# Patient Record
Sex: Male | Born: 1935 | Race: White | Hispanic: No | Marital: Married | Smoking: Former smoker
Health system: Southern US, Community
[De-identification: ages and names within clinical notes are randomized; demographics above are authoritative.]

## PROBLEM LIST (undated history)

## (undated) DIAGNOSIS — I809 Phlebitis and thrombophlebitis of unspecified site: Secondary | ICD-10-CM

## (undated) DIAGNOSIS — M545 Low back pain, unspecified: Secondary | ICD-10-CM

## (undated) DIAGNOSIS — M51379 Other intervertebral disc degeneration, lumbosacral region without mention of lumbar back pain or lower extremity pain: Secondary | ICD-10-CM

## (undated) DIAGNOSIS — M810 Age-related osteoporosis without current pathological fracture: Secondary | ICD-10-CM

## (undated) DIAGNOSIS — N401 Enlarged prostate with lower urinary tract symptoms: Secondary | ICD-10-CM

## (undated) DIAGNOSIS — M109 Gout, unspecified: Secondary | ICD-10-CM

## (undated) DIAGNOSIS — Z972 Presence of dental prosthetic device (complete) (partial): Secondary | ICD-10-CM

## (undated) DIAGNOSIS — E119 Type 2 diabetes mellitus without complications: Secondary | ICD-10-CM

## (undated) DIAGNOSIS — G629 Polyneuropathy, unspecified: Secondary | ICD-10-CM

## (undated) DIAGNOSIS — Z86718 Personal history of other venous thrombosis and embolism: Secondary | ICD-10-CM

## (undated) DIAGNOSIS — Z86010 Personal history of colonic polyps: Secondary | ICD-10-CM

## (undated) DIAGNOSIS — C61 Malignant neoplasm of prostate: Secondary | ICD-10-CM

## (undated) DIAGNOSIS — R911 Solitary pulmonary nodule: Secondary | ICD-10-CM

## (undated) DIAGNOSIS — F1190 Opioid use, unspecified, uncomplicated: Secondary | ICD-10-CM

## (undated) DIAGNOSIS — N2581 Secondary hyperparathyroidism of renal origin: Secondary | ICD-10-CM

## (undated) DIAGNOSIS — K5909 Other constipation: Secondary | ICD-10-CM

## (undated) DIAGNOSIS — M2550 Pain in unspecified joint: Secondary | ICD-10-CM

## (undated) DIAGNOSIS — I5022 Chronic systolic (congestive) heart failure: Secondary | ICD-10-CM

## (undated) DIAGNOSIS — H409 Unspecified glaucoma: Secondary | ICD-10-CM

## (undated) DIAGNOSIS — M4800 Spinal stenosis, site unspecified: Secondary | ICD-10-CM

## (undated) DIAGNOSIS — I2510 Atherosclerotic heart disease of native coronary artery without angina pectoris: Secondary | ICD-10-CM

## (undated) DIAGNOSIS — I10 Essential (primary) hypertension: Secondary | ICD-10-CM

## (undated) DIAGNOSIS — E669 Obesity, unspecified: Secondary | ICD-10-CM

## (undated) DIAGNOSIS — G5603 Carpal tunnel syndrome, bilateral upper limbs: Secondary | ICD-10-CM

## (undated) DIAGNOSIS — M5137 Other intervertebral disc degeneration, lumbosacral region: Secondary | ICD-10-CM

## (undated) DIAGNOSIS — D631 Anemia in chronic kidney disease: Secondary | ICD-10-CM

## (undated) DIAGNOSIS — Z85528 Personal history of other malignant neoplasm of kidney: Secondary | ICD-10-CM

## (undated) DIAGNOSIS — G8929 Other chronic pain: Secondary | ICD-10-CM

## (undated) DIAGNOSIS — E785 Hyperlipidemia, unspecified: Secondary | ICD-10-CM

## (undated) DIAGNOSIS — M353 Polymyalgia rheumatica: Secondary | ICD-10-CM

## (undated) DIAGNOSIS — I350 Nonrheumatic aortic (valve) stenosis: Secondary | ICD-10-CM

## (undated) DIAGNOSIS — M9420 Chondromalacia, unspecified site: Secondary | ICD-10-CM

## (undated) DIAGNOSIS — D369 Benign neoplasm, unspecified site: Secondary | ICD-10-CM

## (undated) DIAGNOSIS — Z860101 Personal history of adenomatous and serrated colon polyps: Secondary | ICD-10-CM

## (undated) DIAGNOSIS — N189 Chronic kidney disease, unspecified: Secondary | ICD-10-CM

## (undated) DIAGNOSIS — M1A9XX0 Chronic gout, unspecified, without tophus (tophi): Secondary | ICD-10-CM

## (undated) DIAGNOSIS — K219 Gastro-esophageal reflux disease without esophagitis: Secondary | ICD-10-CM

## (undated) HISTORY — PX: PROSTATE SURGERY: SHX751

## (undated) HISTORY — PX: EYE SURGERY: SHX253

## (undated) HISTORY — PX: CARDIAC CATHETERIZATION: SHX172

## (undated) HISTORY — PX: APPENDECTOMY: SHX54

## (undated) HISTORY — DX: Chondromalacia, unspecified site: M94.20

## (undated) HISTORY — DX: Spinal stenosis, site unspecified: M48.00

## (undated) HISTORY — DX: Phlebitis and thrombophlebitis of unspecified site: I80.9

## (undated) HISTORY — DX: Age-related osteoporosis without current pathological fracture: M81.0

## (undated) HISTORY — PX: BACK SURGERY: SHX140

## (undated) HISTORY — DX: Obesity, unspecified: E66.9

## (undated) HISTORY — DX: Benign neoplasm, unspecified site: D36.9

## (undated) HISTORY — PX: CATARACT EXTRACTION W/ INTRAOCULAR LENS IMPLANT: SHX1309

## (undated) HISTORY — DX: Pain in unspecified joint: M25.50

---

## 1970-12-21 HISTORY — PX: VEIN LIGATION AND STRIPPING: SHX2653

## 1991-12-22 DIAGNOSIS — I2510 Atherosclerotic heart disease of native coronary artery without angina pectoris: Secondary | ICD-10-CM

## 1991-12-22 HISTORY — DX: Atherosclerotic heart disease of native coronary artery without angina pectoris: I25.10

## 1991-12-22 HISTORY — PX: CORONARY ANGIOPLASTY: SHX604

## 2001-11-09 ENCOUNTER — Encounter: Admission: RE | Admit: 2001-11-09 | Discharge: 2001-11-09 | Payer: Self-pay | Admitting: Nephrology

## 2001-11-09 ENCOUNTER — Encounter: Payer: Self-pay | Admitting: Nephrology

## 2001-11-16 ENCOUNTER — Encounter: Admission: RE | Admit: 2001-11-16 | Discharge: 2001-11-16 | Payer: Self-pay | Admitting: Nephrology

## 2001-11-16 ENCOUNTER — Encounter: Payer: Self-pay | Admitting: Nephrology

## 2003-10-26 ENCOUNTER — Encounter: Admission: RE | Admit: 2003-10-26 | Discharge: 2003-10-26 | Payer: Self-pay | Admitting: Internal Medicine

## 2005-12-21 HISTORY — PX: CORONARY ARTERY BYPASS GRAFT: SHX141

## 2006-03-30 ENCOUNTER — Inpatient Hospital Stay (HOSPITAL_BASED_OUTPATIENT_CLINIC_OR_DEPARTMENT_OTHER): Admission: RE | Admit: 2006-03-30 | Discharge: 2006-03-30 | Payer: Self-pay | Admitting: Interventional Cardiology

## 2006-03-30 ENCOUNTER — Inpatient Hospital Stay (HOSPITAL_COMMUNITY): Admission: AD | Admit: 2006-03-30 | Discharge: 2006-04-06 | Payer: Self-pay | Admitting: Interventional Cardiology

## 2006-03-30 HISTORY — PX: CARDIAC CATHETERIZATION: SHX172

## 2006-03-31 HISTORY — PX: CORONARY ARTERY BYPASS GRAFT: SHX141

## 2006-12-21 DIAGNOSIS — Z86718 Personal history of other venous thrombosis and embolism: Secondary | ICD-10-CM

## 2006-12-21 HISTORY — DX: Personal history of other venous thrombosis and embolism: Z86.718

## 2007-02-10 ENCOUNTER — Ambulatory Visit (HOSPITAL_COMMUNITY): Admission: RE | Admit: 2007-02-10 | Discharge: 2007-02-10 | Payer: Self-pay | Admitting: Rheumatology

## 2007-02-10 ENCOUNTER — Ambulatory Visit: Payer: Self-pay | Admitting: Vascular Surgery

## 2007-03-17 ENCOUNTER — Encounter: Payer: Self-pay | Admitting: Vascular Surgery

## 2007-03-17 ENCOUNTER — Ambulatory Visit: Payer: Self-pay | Admitting: Vascular Surgery

## 2007-03-17 ENCOUNTER — Inpatient Hospital Stay (HOSPITAL_COMMUNITY): Admission: EM | Admit: 2007-03-17 | Discharge: 2007-03-18 | Payer: Self-pay | Admitting: Emergency Medicine

## 2007-03-17 ENCOUNTER — Ambulatory Visit (HOSPITAL_COMMUNITY): Admission: RE | Admit: 2007-03-17 | Discharge: 2007-03-17 | Payer: Self-pay | Admitting: Rheumatology

## 2007-12-22 DIAGNOSIS — Z8546 Personal history of malignant neoplasm of prostate: Secondary | ICD-10-CM

## 2007-12-22 HISTORY — DX: Personal history of malignant neoplasm of prostate: Z85.46

## 2008-09-26 ENCOUNTER — Encounter: Admission: RE | Admit: 2008-09-26 | Discharge: 2008-09-26 | Payer: Self-pay | Admitting: Rheumatology

## 2008-10-15 ENCOUNTER — Ambulatory Visit: Payer: Self-pay | Admitting: Hematology

## 2008-11-01 ENCOUNTER — Ambulatory Visit: Admission: RE | Admit: 2008-11-01 | Discharge: 2009-01-30 | Payer: Self-pay | Admitting: Radiation Oncology

## 2008-11-21 LAB — CBC WITH DIFFERENTIAL/PLATELET
Basophils Absolute: 0 10*3/uL (ref 0.0–0.1)
Eosinophils Absolute: 0 10*3/uL (ref 0.0–0.5)
HGB: 13.7 g/dL (ref 13.0–17.1)
LYMPH%: 18.4 % (ref 14.0–48.0)
MCHC: 34 g/dL (ref 32.0–35.9)
MCV: 97.4 fL (ref 81.6–98.0)
NEUT%: 74.5 % (ref 40.0–75.0)
RBC: 4.15 10*6/uL — ABNORMAL LOW (ref 4.20–5.71)

## 2008-11-27 LAB — COMPREHENSIVE METABOLIC PANEL
ALT: 36 U/L (ref 0–53)
AST: 29 U/L (ref 0–37)
Alkaline Phosphatase: 61 U/L (ref 39–117)
BUN: 35 mg/dL — ABNORMAL HIGH (ref 6–23)
CO2: 24 mEq/L (ref 19–32)
Calcium: 9.7 mg/dL (ref 8.4–10.5)
Chloride: 105 mEq/L (ref 96–112)
Creatinine, Ser: 1.84 mg/dL — ABNORMAL HIGH (ref 0.40–1.50)
Glucose, Bld: 226 mg/dL — ABNORMAL HIGH (ref 70–99)
Sodium: 139 mEq/L (ref 135–145)

## 2008-11-27 LAB — HYPERCOAGULABLE PANEL, COMPREHENSIVE RET.
AntiThromb III Func: 119 % (ref 76–126)
Beta-2-Glycoprotein I IgM: <4 U/mL (ref <10)
DRVVT: 39.6 secs (ref 36.1–47.0)
PTTLA 4:1 Mix: 48.8 secs (ref 36.3–48.8)
Protein C Activity: 136 % — ABNORMAL HIGH (ref 75–133)
Protein C, Total: 103 % (ref 70–140)
Protein S Ag, Total: 119 % (ref 70–140)

## 2008-12-17 ENCOUNTER — Ambulatory Visit: Payer: Self-pay | Admitting: Hematology

## 2009-02-08 ENCOUNTER — Ambulatory Visit (HOSPITAL_BASED_OUTPATIENT_CLINIC_OR_DEPARTMENT_OTHER): Admission: RE | Admit: 2009-02-08 | Discharge: 2009-02-08 | Payer: Self-pay | Admitting: Urology

## 2009-02-08 HISTORY — PX: INSERTION PROSTATE RADIATION SEED: SUR718

## 2009-02-28 ENCOUNTER — Ambulatory Visit: Admission: RE | Admit: 2009-02-28 | Discharge: 2009-05-14 | Payer: Self-pay | Admitting: Radiation Oncology

## 2009-12-21 DIAGNOSIS — Z8739 Personal history of other diseases of the musculoskeletal system and connective tissue: Secondary | ICD-10-CM

## 2009-12-21 HISTORY — DX: Personal history of other diseases of the musculoskeletal system and connective tissue: Z87.39

## 2011-04-07 LAB — CBC
Hemoglobin: 14.2 g/dL (ref 13.0–17.0)
MCHC: 33.9 g/dL (ref 30.0–36.0)
Platelets: 228 10*3/uL (ref 150–400)
RBC: 4.24 MIL/uL (ref 4.22–5.81)

## 2011-04-07 LAB — PROTIME-INR
INR: 0.9 (ref 0.00–1.49)
Prothrombin Time: 12.7 seconds (ref 11.6–15.2)

## 2011-04-07 LAB — GLUCOSE, CAPILLARY
Glucose-Capillary: 164 mg/dL — ABNORMAL HIGH (ref 70–99)
Glucose-Capillary: 171 mg/dL — ABNORMAL HIGH (ref 70–99)

## 2011-04-07 LAB — COMPREHENSIVE METABOLIC PANEL
AST: 27 U/L (ref 0–37)
CO2: 29 mEq/L (ref 19–32)
GFR calc Af Amer: 58 mL/min — ABNORMAL LOW (ref >60)
GFR calc non Af Amer: 48 mL/min — ABNORMAL LOW (ref >60)
Sodium: 144 mEq/L (ref 135–145)
Total Bilirubin: 0.8 mg/dL (ref 0.3–1.2)

## 2011-05-05 NOTE — Op Note (Signed)
NAME:  [name], [name]                  ACCOUNT NO.:  [id number]   MEDICAL RECORD NO.:  [id number]          PATIENT TYPE:  AMB   LOCATION:  [facility]                         FACILITY:  [facility]   PHYSICIAN:  Lucina Mellow. Terance Hart, M.D.DATE OF BIRTH:  [birth year]   DATE OF PROCEDURE:  02/08/2009  DATE OF DISCHARGE:                               OPERATIVE REPORT   PREOPERATIVE DIAGNOSIS:  Prostatic carcinoma.   POSTOPERATIVE DIAGNOSIS:  Prostatic carcinoma.   PROCEDURES:  1. Radioactive seed implantation of the prostate.  2. Cystoscopy.   SURGEON:  Lucina Mellow. Terance Hart, M.D.   ASSISTANT:  Sheral Apley. Tammi Klippel, M.D.   ANESTHESIA:  General.   INDICATIONS:  This 77 year old gentleman was found to have a PSA  elevated to 5.54 and prostate ultrasound and biopsy revealed a 31 gram  prostate and the final pathology showed significant Gleason 7 carcinoma.  He was counseled about therapy and opted for radiation.  He has  undergone 5 weeks of external beam radiation and now presents for  combination therapy by means of radioactive seed implantation.  He was  advised about the risks of voiding dysfunction, bleeding, fever,  infection.   PROCEDURE:  The patient was brought to the operating room, placed in the  lithotomy position.  The external genitalia and lower abdomen and  perineum prepped and draped in the usual fashion.  Foley catheter was  inserted and a transrectal ultrasound probe inserted and radioactive  seed planning session was completed.  After that, a total of 23  activated needles with 75 seeds of I-125 were implanted under  fluoroscopic control.  Fluoroscopic x-ray was  taken which revealed good seed distribution.  He then underwent  cystoscopy and the anterior and prostatic urethras and bladder were  unremarkable, no evidence of seeds in the urethra or bladder.  Foley  catheter was inserted and the patient taken to the recovery room in good  condition having tolerated the procedure well with  minimal blood loss.      Lucina Mellow. Terance Hart, M.D.  Electronically Signed     LJP/MEDQ  D:  02/08/2009  T:  02/08/2009  Job:  [id number]   cc:   Ander Slade. [name], M.D.  Fax: [place]. Tammi Klippel, M.D.  Fax: [phone]

## 2011-05-08 NOTE — H&P (Signed)
NAME:  [name], [name]                  ACCOUNT NO.:  [id number]   MEDICAL RECORD NO.:  [id number]          PATIENT TYPE:  INP   LOCATION:  [id number]                         FACILITY:  [facility]   PHYSICIAN:  [name], M.D.DATE OF BIRTH:  1934-09-16   DATE OF ADMISSION:  03/17/2007  DATE OF DISCHARGE:                              HISTORY & PHYSICAL   PRIMARY CARE PHYSICIAN:  Dr. Rockwell Lyons.   CHIEF COMPLAINT:  Right leg swelling, and per PCP, right leg thrombus.   HISTORY OF PRESENT ILLNESS:  The patient is a 77 year old white male  with past medical history significant for right leg phlebitis, also  history of vein stripping in the right leg in 1972, recently treated for  superficial below knee clot in the right leg, per primary care physician  with NSAIDs, PMR with a normal sed rate, gout, azotemia, diabetes  mellitus, coronary artery disease - status post CABG in 2007, who  presents with above complaints.  He states that on February 23rd, he  noticed swelling in his right leg just below his knee, he was seen by  his primary care physician, diagnosed with a superficial vein thrombosis  and treated with Naprosyn.  Initially, it seemed to be improving, but  about two weeks later the swelling began to increase, with some redness  and also soreness in that leg, and he could palpate the course of the  vein to just below his right groin area.  He came back to the office to  see his primary care physician, at which time he was sent to have an  ultrasound done, and per Dr. Rockwell Lyons, the ultrasound showed a thrombus  in the greater saphenous vein, but it was beginning to progress into the  deep veins.  The patient denies chest pain, shortness of breath, fevers,  dysuria, melena and no diarrhea.  He states that his last really long  distance travel by car was in January (about two months ago to [place],  and about one to two  weeks ago he also drove to [facility]  He  denies any recent surgery  - his CABG was in April of 2007.  He states  that his father had varicose veins but no history of clots.   PAST MEDICAL HISTORY:  As stated above.  History of osteoporosis,  history of post-op atrial fibrillation, history of post-op anemia,  history of appendectomy, history of PTCA.   MEDICATIONS:  1. Indocin 75 mg p.o. b.i.d.  (Naprosyn discontinued).  2. Prednisone 2.5 mg daily.  3. Aspirin 81 mg daily.  4. Glucotrol 10 mg b.i.d.  5. Zetia 10 mg daily.  6. Xalatan 1 drop in both eyes q.h.s.  7. Colchicine 0.6 mg daily.   ALLERGIES:  PENICILLIN.   SOCIAL HISTORY:  He quit tobacco in 1981, he denies alcohol.   FAMILY HISTORY:  Father had an MI at age 65, deceased age 36.  Mother  had CABG at age 88, deceased age 53.   REVIEW OF SYSTEMS:  As per HPI.  Other review of systems negative.   PHYSICAL EXAMINATION:  GENERAL:  The patient is a pleasant, elderly  white male.  He is alert and oriented, in no apparent distress.  VITAL SIGNS:  His temperature is 97.5 with a blood pressure of 151/86,  pulse of 70, respiratory rate of 18, O2 SAT of 98%.  HEENT:  PERRL, EOMI.  Sclerae anicteric, moist mucous membranes, no oral  exudates.  NECK:  Supple, no adenopathy, no thyromegaly and no JVD.  LUNGS:  Clear to auscultation bilaterally.  No crackles or wheezes.  CARDIOVASCULAR/CHEST:  There is a well-healed midline scar, regular rate  and rhythm, normal S1, S2.  ABDOMEN:  Obese, soft, bowel sounds present, nontender, nondistended.  No organomegaly and no masses palpable.  EXTREMITIES:  On his right lower extremity just below his knee medially,  there is about a 3 to 4-cm palpable mass, firm, nontender, and there is  a palpable cord medially all the way to just below his groin area on the  right, mild erythema.  On the left, there is no edema and no erythema.  NEUROLOGIC:  Alert and oriented x3.  Cranial nerves 2-12 grossly intact.  Nonfocal exam.   LABORATORY DATA:  Chest x-ray:  No  acute findings.  EKG:  Normal sinus  rhythm.  No ischemic changes.  His white cell count is 6.7 with a  hemoglobin of 12.8, hematocrit 37.8, platelet count of 323, neutrophil  count 63%.  Chemistries:  Pending at the time of this dictation.  The  ultrasound as per HPI.   ASSESSMENT/PLAN:  Problem:  1. Propagating thrombus, right greater saphenous vein:  As discussed      above.  Possible risk factors including long distance travel in      January about a week to two weeks ago.  Also venous incompetence,      as discussed above, will also obtain a hypercoagulable panel to      rule out other etiologies.  Will place patient on Lovenox/Coumadin.      Consult care manager for assistance with possible home Lovenox.  2. Diabetes mellitus - Continue Accu-Check monitoring, sliding scale      insulin.  3. Coronary artery disease status post coronary artery bypass graft,      stable.  Continue aspirin.  4. Dyslipidemia.  Continue Zetia and Lipitor.  5. History of PMR with normal sed rate - continue prednisone.  6. History of gout - continue Colchicine.      [name], M.D.  Electronically Signed     ACV/MEDQ  D:  03/17/2007  T:  03/17/2007  Job:  [id number]   cc:   Dr. Loel Lyons at [place]. Polite, M.D.

## 2011-05-08 NOTE — Consult Note (Signed)
[name], [name]                  ACCOUNT NO.:  [id number]   MEDICAL RECORD NO.:  [id number]          PATIENT TYPE:  OIB   LOCATION:  1963                         FACILITY:  [facility]   PHYSICIAN:  Lanelle Bal, MD    DATE OF BIRTH:  09/25/1934   DATE OF CONSULTATION:  03/30/2006  DATE OF DISCHARGE:                                   CONSULTATION   FOLLOW-UP CARE PHYSICIAN:  Belva Crome, M.D.   PRIMARY CARE PHYSICIAN:  Ander Slade. [name], M.D.   REASON FOR CONSULTATION:  Coronary artery disease with left main  obstruction.   HISTORY OF PRESENT ILLNESS:  The patient is a 77 year old male with known  coronary artery disease, who has known diabetes and known coronary disease,  was found in March to have inferior apical ischemia and decreased ejection  fraction and a history of total occlusion of the right coronary artery.  He  had previously had an angioplasty of the right coronary artery in 1993.  Over the past several months, especially with cold weather, he has had  recurrence of substernal chest discomfort, which radiates to the left  shoulder, with exertion.  He denies resting shortness of breath, denies  nausea.  In March of 2007, a repeat Cardiolite stress test was markedly  abnormal and the patient was brought to the outpatient cath lab for cath to  rule out progression of disease, particularly in the LAD, which on previous  caths, had had some disease.   PAST MEDICAL HISTORY:  1.  Coronary artery disease with angioplasty of the right coronary artery in      1993.  Repeat cath in 1994, with a 40-50% mid-LAD lesion.  2.  History of diabetes.  3.  History of polymyalgia rheumatica with a normal sed rate, diagnosed      approximately one year ago.  The patient has been on tapering doses of      steroids, from 15 mg a day to, since April 1, 5 mg a day.  4.  History of osteoporosis.  5.  History of dyslipidemia.  6.  History of gout.  7.  History of phlebitis in the right  lower leg.   CARDIAC RISK FACTORS:  The patient denies hypertension.  He does have type 2  diabetes with hemoglobin A1C most recent of 7.1.  Was a remote smoker, quit  25 years ago.  He has a history of hyperlipidemia, currently on Zetia and  Lipitor.  Denies previous stroke.  Denies claudication.  Baseline creatinine  is 1.5.   PAST SURGICAL HISTORY:  1.  Appendectomy at age 82.  2.  Right lower leg vein stripping in the 1970's.   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  Denies  alcohol use.   MEDICATIONS INCLUDE:  1.  Cardizem-CD 240 mg daily.  2.  Aspirin 81 mg daily.  3.  Glucotrol 10 mg each a.m. and each p.m.  4.  Actos 15 mg a day.  5.  Colchicine  0.6 mg a day.  6.  Prednisone 5 mg a day.  7.  Actonel 35 mg a week.  8.  Lipitor 20 mg a day.  9.  Indomethacin p.r.n.  10. Zetia 10 mg a day.  11. Xalatan 0.05% one drop in each eye.  12. Calcium 600 mg a day.  13. Multivitamin once a day.   DRUG ALLERGIES INCLUDE:  PENICILLIN, 45 years ago, caused itching and  swelling of his feet and hands.   REVIEW OF SYSTEMS:  CARDIAC:  Positive for chest discomfort.  Denies resting  shortness of breath.  He does have exertional shortness of breath when  climbing stairs, but can walk on the flat up to 30 minutes without shortness  of breath.  Denies orthopnea, pre-syncope, syncope or palpitations.  He does  have right lower extremity swelling at the end of the day.  GENERAL:  Denies  weight-gain.  Notes that, since taking steroids, his overall symptoms and  activity level have improved.  RESPIRATORY:  Noted above; denies hemoptysis.  GASTROINTESTINAL:  No change in bowel habits, no blood in stool.  NEUROLOGIC:  Denies amaurosis or TIA.  MUSCULOSKELETAL:  As noted above with  significant arthritis, especially in the knees.  GU:  Denies hematuria.  Denied recent infections.  ENDOCRINE:  Known  diabetes.  Denies psychiatric  history.  Denies claudication.   PHYSICAL EXAMINATION:  VITAL SIGNS:  Heart rate is 80, blood pressure is  120/82, O2 sat is 96% on room air.  He is 5 feet 8 inches tall, weighs 200  pounds.  GENERAL:  The patient is awake, alert, neurologically intact and able to  relate his history.  HEENT:  Pupils were equal, round and reactive to light.  NECK:  Without carotid bruits.  LUNGS:  Clear bilaterally.  CARDIAC EXAM:  Reveals regular rate and rhythm without murmur or gallop.  ABDOMINAL EXAM:  Benign without palpable mass.  NEUROLOGIC EXAM:  Intact without gross neurologic abnormalities.  EXTREMITIES:  He has 2+ DP and PT pulses bilaterally.   LABORATORY FINDINGS:  Creatinine is 1.5, glucose is 236.  Hematocrit is  39.4, white count 9.1.   Cardiac catheterization shows ejection fraction approximately 50% without  mitral regurgitation.  Angiography shows 70-80% distal left main, 80%  proximal LAD, 50% proximal circumflex, 80% ramus.  The right coronary artery  is totally occluded with collateral filling of a moderate sized PL branch.   IMPRESSION:  Patient with positive stress test, diabetes, left main and  three-vessel disease.  Agree with recommendation for coronary artery bypass  grafting.  The risks and options have been discussed with the patient in  detail and the risks of surgery, including death, infection, stroke,  myocardial infarction, bleeding, blood transfusion, have all been discussed  with the patient.  We specifically discussed renal failure, especially with  his known diabetes.  The risk of angioplasty or continued medical therapy far outweighs the risks  of bypass surgery and the patient and his wife have had their questions  answered and he is willing to proceed.  We will plan on proceeding with  bypass surgery on March 31, 2006.  The patient is agreeable with this  approach.     Lanelle Bal, MD  Electronically Signed      EG/MEDQ  D:  03/30/2006  T:  03/30/2006  Job:  [id number]

## 2011-05-08 NOTE — Cardiovascular Report (Signed)
NAMERyden, Weigandt Carle                  ACCOUNT NO.:  [id number]   MEDICAL RECORD NO.:  [id number]          PATIENT TYPE:  OIB   LOCATION:  1963                         FACILITY:  [facility]   PHYSICIAN:  Belva Crome, M.D.   DATE OF BIRTH:  Mar 23, 1934   DATE OF PROCEDURE:  03/30/2006  DATE OF DISCHARGE:                              CARDIAC CATHETERIZATION   INDICATIONS FOR PROCEDURE:  Progressive angina over the past 6-8 months.  Recent Cardiolite study demonstrating a large fixed inferior wall defect  that is chronic and a suggestion of diminished apical uptake which was new.  The study is being done to rule out progression of LAD disease.   PROCEDURE PERFORMED:  1.  Left heart catheterization.  2.  Selective coronary angiography.  3.  Left ventriculography.   DESCRIPTION:  After informed consent and using 1% Xylocaine with local  infiltration in the right iliac region, a 4-French sheath was placed in the  right femoral artery using the modified Seldinger technique.  The 4-French,  A2 multipurpose catheter was then used for hemodynamic recordings, left  ventriculography by hand injection and right coronary angiography.  We used  #4 left catheter for left coronary angiography.  The patient tolerated  procedure without complications.   RESULTS:  1.  Hemodynamic data.      1.  Aortic pressure is 121/69.      2.  Left ventricular pressure 125/80 mmHg.  2.  Coronary angiography:  The left and right coronaries are heavily      calcified.      1.  Left main coronary:  There is distal 70% stenosis of the left main.      2.  Left anterior descending coronary:  The LAD was a large vessel that          wraps around the left ventricular apex.  It supplies collaterals to          the distal right coronary as does the left atrial recurrent branch          of the circumflex artery.  The LAD in its proximal/mid segment          contains segmental 70% narrowing.      3.  Ramus intermedius branch:   Large ramus branch bifurcates into two          lateral wall branches.  Proximal to and within the bifurcation there          is 80% stenosis of both limbs of the bifurcation and proximal to the          bifurcation..      4.  Circumflex artery:  Circumflex coronary artery contains perhaps 30-          40% narrowing at its origin from the left main.  It gives a left          atrial recurrent branch that supplies the distal right coronary.          PDA and left ventricular branch was seen to fill.      5.  Right coronary:  The right coronary is known to be chronically and          totally occluded.  There is mid 1% obstruction.  A large acute          marginal branch supplies the distal right coronary via collaterals          that also supply the PDA.   CONCLUSIONS:  1.  Severe coronary artery disease with chronic total occlusion of right      coronary collateralized from the left atrial recurrent in LAD as well as      an acute marginal branch of the right coronary.  2.  There is 70% stenosis in the distal left main.  There is 80% stenosis in      the proximal LAD and there is 80% stenosis in the ramus intermedius      branch.  Small circumflex is free of significant stenosis.  3.  Overall normal LV function.   PLAN:  The patient needs to have coronary bypass grafting.  He has had a  gradual development of his symptoms over the last 6 months to a year.  He is  having no acute coronary syndrome type symptoms at this time.  We will go  ahead and get him set up to see Dr. Servando Snare as an outpatient as soon as  possible on April 01, 2006.  Hopefully, surgery can be done within the next  7 days or so.      Belva Crome, M.D.  Electronically Signed     HWS/MEDQ  D:  03/30/2006  T:  03/30/2006  Job:  [id number]   cc:   Ander Slade. [name], M.D.  Fax: [place], [place]  [phone]  [place]  [place] [address]

## 2011-05-08 NOTE — Discharge Summary (Signed)
NAMETramar, [name]                  ACCOUNT NO.:  [id number]   MEDICAL RECORD NO.:  [id number]          PATIENT TYPE:  INP   LOCATION:  2011                         FACILITY:  [facility]   PHYSICIAN:  Lanelle Bal, MD    DATE OF BIRTH:  1934-05-05   DATE OF ADMISSION:  03/30/2006  DATE OF DISCHARGE:  04/06/2006                                 DISCHARGE SUMMARY   PRIMARY DIAGNOSIS:  Coronary occlusive disease.   SECONDARY DIAGNOSES:  1.  Postoperative atrial fibrillation.  2.  Postoperative anemia.   SECONDARY DIAGNOSES:  1.  Diabetes mellitus.  2.  History of polymyalgia rheumatica, with normal sed rate, diagnosed      approximately one year ago.  3.  History of osteoporosis.  4.  History of dyslipidemia.  5.  History of gout.  6.  History of phlebitis in the right lower leg.  7.  Status post appendectomy.  8.  Status post right lower leg vein stripping in 1970s.   ALLERGIES:  Allergic to PENICILLIN.   IN-HOSPITAL OPERATIONS AND PROCEDURES:  1.  Cardiac catheterization with left heart catheterization, selective      coronary angiography, left ventriculography.  2.  Coronary artery bypass grafting x4, using the left internal mammary      artery to left anterior descending coronary artery, reverse saphenous      vein graft to intermediate coronary artery, reverse saphenous vein graft      sequentially to the distal RCA and posterolateral branch of the RCA,      with left leg Endovein harvesting.   HISTORY AND PHYSICAL AND HOSPITAL COURSE:  The patient is a 77 year old male  with known coronary artery disease who has known diabetes.  The patient was  found in March 2007 to have inferior apical ischemia and decreased ejection  fraction and history of total occlusion of the RCA.  He had previously had  an angioplasty of the RCA in 1993.  Over the past several months, especially  in the cold weather, he has had recurrent substernal chest discomfort which  radiates to the left  shoulder with exertion.  In March 2007, a repeat  Cardiolite stress test was markedly abnormal. The patient was brought to the  outpatient catheterization lab for catheterization to rule out progression  of disease.  This was done on March 30, 2006.  This showed severe coronary  artery disease with chronic total occlusion of the right coronary  collateral, from the left atrial recurrent and LAD as well as an acute  marginal branch of the RCA.  There was 70% stenosis in the distal left main.  There was 80% stenosis in the proximal LAD, and there was 80% stenosis in  the ramus intermedius branch.  Overall normal LV function.  Following  catheterization, Dr. Servando Snare was consulted.  The patient was seen and  evaluated by Dr. Servando Snare.  Dr. Servando Snare discussed risks and benefits of  this procedure with the patient.  The patient acknowledged understanding and  agreed proceed.  Surgery was scheduled for March 31, 2006.   The patient was taken  to operating room on March 31, 2006, where he  underwent coronary artery bypass grafting x4, using a left internal mammary  artery to left anterior descending coronary artery, reverse saphenous vein  graft to the intermediate coronary artery, reverse saphenous vein graft  sequentially to the distal RCA and posterolateral branch of the RCA, with  left leg Endovein harvest.  The patient tolerated this procedure well and  was transferred up to the intensive care unit in stable condition.  Immediately following surgery, the patient was seen to be hemodynamically  stable.  The patient was extubated in the early morning following surgery.  During the patient's postoperative course, he did have a brief episode of  postoperative atrial fibrillation.  The patient converted back to normal  sinus rhythm on his own and remained in normal sinus rhythm the remainder of  his postoperative course.  He had maintained in normal sinus rhythm, treated  with beta blocker.  Also,  during the patient's postoperative course, he  developed acute blood loss anemia.  Hemoglobin and hematocrit had dropped to  7.7 and 22 on postop day 3.  The patient received 1 unit of packed red blood  cells.  The patient's hemoglobin and hematocrit were stable prior to  discharge.  Last H&H was done postop day 6, measuring 9.3 and 26.6.  The  remainder of the patient's postoperative course was pretty much  unremarkable.  Chest tubes and lines were discontinued in the normal  fashion.  The patient was out of bed ambulating well.  He was transferred  out to 2000 on postop day 5.  The patient's incisions were dry and intact  and healing well.  The patient remained afebrile postoperatively.  Vital  signs were monitored, and blood pressure was stable prior to discharge.  The  patient was weaned off oxygen, saturating greater than 90% prior to  discharge.  The patient's creatinine was also monitored postoperatively.  This remained stable.  Last creatinine obtained was postop day 6, and it  measured 1.4.  The patient was tolerating a regular diet well.  No nausea or  vomiting noted.  Bowel movements within normal limits.  Prior to discharge,  the patient was below his preoperative weight, weighing at 205.7 pounds,  where his preoperative weight was 210 pounds.  The patient does have a  history of diabetes.  The patient's blood sugars were monitored during his  postoperative course.  He was restarted on his home diabetic medications.  Blood sugars were well controlled prior to discharge.   The patient was discharged to home postop day 6, April 06, 2006, in stable  condition.  Follow-up appointment will be arranged with Dr. Servando Snare for in  3 weeks.  The patient will need to follow up with Dr. Tamala Julian in 2 weeks.  [name] received instructions on diet, activity level, and incisional care.  He was told no driving until released to do so, no heavy lifting over 10 pounds.  The patient is told he is  allowed to shower, washing his incisions  using soap and water.  He is to contact the office if he develops any  drainage or opening from any of his incision sites.  The patient was told to  ambulate 3-4 times a day, progress as tolerated, and to continue his  breathing exercises.  The patient was educated on diet to be lowfat, low  salt.  He acknowledges understanding.   DISCHARGE MEDICATIONS:  1.  Xalatan 1 drop both eyes  at night.  2.  Multivitamin daily.  3.  Calcium 600 mg daily.  4.  Aspirin 325 mg daily.  5.  Toprol XL 25 mg daily.  6.  Zetia 10 mg daily.  7.  Prednisone 5 mg daily.  8.  Actos 30 mg daily.  9.  Glipizide 10 mg b.i.d.Marland Kitchen  10. Lipitor 20 mg at bedtime.  11. Lasix 40 mg daily x5 days.  12. Potassium chloride 20 mEq daily x5 days.  13. Colchicine 0.6 mg daily.  14. Oxycodone 5 mg 1-2 tablets q.4-6 h. p.r.n. pain.      Darlin Coco, [place]      Lanelle Bal, MD  Electronically Signed    KMD/MEDQ  D:  05/06/2006  T:  05/07/2006  Job:  [id number]   cc:   Belva Crome, M.D.  Fax: [phone]

## 2012-10-25 ENCOUNTER — Other Ambulatory Visit: Payer: Self-pay | Admitting: Geriatric Medicine

## 2012-10-25 DIAGNOSIS — M549 Dorsalgia, unspecified: Secondary | ICD-10-CM

## 2012-10-28 ENCOUNTER — Other Ambulatory Visit: Payer: Self-pay | Admitting: Geriatric Medicine

## 2012-10-28 DIAGNOSIS — Z139 Encounter for screening, unspecified: Secondary | ICD-10-CM

## 2012-10-31 ENCOUNTER — Ambulatory Visit
Admission: RE | Admit: 2012-10-31 | Discharge: 2012-10-31 | Disposition: A | Discharge status: Home / Self Care | Payer: Medicare Other | Source: Ambulatory Visit | Attending: Geriatric Medicine | Admitting: Geriatric Medicine

## 2012-10-31 DIAGNOSIS — M549 Dorsalgia, unspecified: Secondary | ICD-10-CM

## 2012-10-31 DIAGNOSIS — Z139 Encounter for screening, unspecified: Secondary | ICD-10-CM

## 2012-11-01 ENCOUNTER — Other Ambulatory Visit: Payer: Self-pay | Admitting: Geriatric Medicine

## 2012-11-01 DIAGNOSIS — N2889 Other specified disorders of kidney and ureter: Secondary | ICD-10-CM

## 2012-11-04 ENCOUNTER — Ambulatory Visit
Admission: RE | Admit: 2012-11-04 | Discharge: 2012-11-04 | Disposition: A | Discharge status: Home / Self Care | Payer: Medicare Other | Source: Ambulatory Visit | Attending: Geriatric Medicine | Admitting: Geriatric Medicine

## 2012-11-04 DIAGNOSIS — N2889 Other specified disorders of kidney and ureter: Secondary | ICD-10-CM

## 2012-11-24 ENCOUNTER — Other Ambulatory Visit: Payer: Self-pay | Admitting: Neurosurgery

## 2012-11-24 DIAGNOSIS — M4802 Spinal stenosis, cervical region: Secondary | ICD-10-CM

## 2012-11-26 ENCOUNTER — Other Ambulatory Visit: Payer: Medicare Other

## 2012-11-30 ENCOUNTER — Other Ambulatory Visit: Payer: Self-pay | Admitting: Urology

## 2012-11-30 ENCOUNTER — Ambulatory Visit
Admission: RE | Admit: 2012-11-30 | Discharge: 2012-11-30 | Disposition: A | Discharge status: Home / Self Care | Payer: Medicare Other | Source: Ambulatory Visit | Attending: Neurosurgery | Admitting: Neurosurgery

## 2012-11-30 DIAGNOSIS — M4802 Spinal stenosis, cervical region: Secondary | ICD-10-CM

## 2012-11-30 DIAGNOSIS — Q619 Cystic kidney disease, unspecified: Secondary | ICD-10-CM

## 2012-12-06 ENCOUNTER — Other Ambulatory Visit: Payer: Medicare Other

## 2012-12-06 ENCOUNTER — Ambulatory Visit
Admission: RE | Admit: 2012-12-06 | Discharge: 2012-12-06 | Disposition: A | Discharge status: Home / Self Care | Payer: Medicare Other | Source: Ambulatory Visit | Attending: Urology | Admitting: Urology

## 2012-12-06 DIAGNOSIS — Q619 Cystic kidney disease, unspecified: Secondary | ICD-10-CM

## 2012-12-06 MED ORDER — GADOBENATE DIMEGLUMINE 529 MG/ML IV SOLN
9.0000 mL | Freq: Once | INTRAVENOUS | Status: AC | PRN
  Administered 2012-12-06: 9 mL via INTRAVENOUS

## 2012-12-21 DIAGNOSIS — Z85528 Personal history of other malignant neoplasm of kidney: Secondary | ICD-10-CM

## 2012-12-21 HISTORY — DX: Personal history of other malignant neoplasm of kidney: Z85.528

## 2012-12-27 ENCOUNTER — Other Ambulatory Visit: Payer: Self-pay | Admitting: Geriatric Medicine

## 2012-12-27 DIAGNOSIS — M79609 Pain in unspecified limb: Secondary | ICD-10-CM

## 2012-12-27 DIAGNOSIS — R609 Edema, unspecified: Secondary | ICD-10-CM

## 2012-12-28 ENCOUNTER — Ambulatory Visit
Admission: RE | Admit: 2012-12-28 | Discharge: 2012-12-28 | Disposition: A | Discharge status: Home / Self Care | Payer: Medicare Other | Source: Ambulatory Visit | Attending: Geriatric Medicine | Admitting: Geriatric Medicine

## 2012-12-28 DIAGNOSIS — R609 Edema, unspecified: Secondary | ICD-10-CM

## 2012-12-28 DIAGNOSIS — M79609 Pain in unspecified limb: Secondary | ICD-10-CM

## 2012-12-30 ENCOUNTER — Ambulatory Visit (HOSPITAL_COMMUNITY)
Admission: RE | Admit: 2012-12-30 | Discharge: 2012-12-30 | Disposition: A | Discharge status: Home / Self Care | Payer: Medicare Other | Source: Ambulatory Visit | Attending: Urology | Admitting: Urology

## 2012-12-30 ENCOUNTER — Other Ambulatory Visit (HOSPITAL_COMMUNITY): Payer: Self-pay | Admitting: Urology

## 2012-12-30 DIAGNOSIS — D4959 Neoplasm of unspecified behavior of other genitourinary organ: Secondary | ICD-10-CM

## 2012-12-30 DIAGNOSIS — I10 Essential (primary) hypertension: Secondary | ICD-10-CM | POA: Insufficient documentation

## 2012-12-30 DIAGNOSIS — N289 Disorder of kidney and ureter, unspecified: Secondary | ICD-10-CM | POA: Insufficient documentation

## 2013-01-02 ENCOUNTER — Encounter (HOSPITAL_COMMUNITY): Payer: Self-pay | Admitting: Pharmacy Technician

## 2013-01-02 ENCOUNTER — Other Ambulatory Visit (HOSPITAL_COMMUNITY): Payer: Self-pay | Admitting: Urology

## 2013-01-02 ENCOUNTER — Other Ambulatory Visit: Payer: Self-pay | Admitting: Urology

## 2013-01-02 ENCOUNTER — Other Ambulatory Visit: Payer: Self-pay | Admitting: Physician Assistant

## 2013-01-02 DIAGNOSIS — N2889 Other specified disorders of kidney and ureter: Secondary | ICD-10-CM

## 2013-01-02 DIAGNOSIS — D4959 Neoplasm of unspecified behavior of other genitourinary organ: Secondary | ICD-10-CM

## 2013-01-03 ENCOUNTER — Encounter (HOSPITAL_COMMUNITY): Payer: Self-pay

## 2013-01-03 ENCOUNTER — Ambulatory Visit (HOSPITAL_COMMUNITY)
Admission: RE | Admit: 2013-01-03 | Discharge: 2013-01-03 | Disposition: A | Discharge status: Home / Self Care | Payer: Medicare Other | Source: Ambulatory Visit | Attending: Urology | Admitting: Urology

## 2013-01-03 DIAGNOSIS — D4959 Neoplasm of unspecified behavior of other genitourinary organ: Secondary | ICD-10-CM

## 2013-01-03 DIAGNOSIS — H409 Unspecified glaucoma: Secondary | ICD-10-CM | POA: Insufficient documentation

## 2013-01-03 DIAGNOSIS — N2889 Other specified disorders of kidney and ureter: Secondary | ICD-10-CM

## 2013-01-03 DIAGNOSIS — M109 Gout, unspecified: Secondary | ICD-10-CM | POA: Insufficient documentation

## 2013-01-03 DIAGNOSIS — N289 Disorder of kidney and ureter, unspecified: Secondary | ICD-10-CM | POA: Insufficient documentation

## 2013-01-03 HISTORY — DX: Unspecified glaucoma: H40.9

## 2013-01-03 HISTORY — DX: Hyperlipidemia, unspecified: E78.5

## 2013-01-03 HISTORY — DX: Gout, unspecified: M10.9

## 2013-01-03 LAB — CBC
HCT: 38.4 % — ABNORMAL LOW (ref 39.0–52.0)
MCH: 33 pg (ref 26.0–34.0)
MCHC: 34.4 g/dL (ref 30.0–36.0)
MCV: 96 fL (ref 78.0–100.0)
Platelets: 185 10*3/uL (ref 150–400)
RDW: 14.7 % (ref 11.5–15.5)
WBC: 6.7 10*3/uL (ref 4.0–10.5)

## 2013-01-03 MED ORDER — HYDROCODONE-ACETAMINOPHEN 5-325 MG PO TABS
1.0000 | ORAL_TABLET | ORAL | Status: DC | PRN
  Filled 2013-01-03: qty 2

## 2013-01-03 MED ORDER — FENTANYL CITRATE 0.05 MG/ML IJ SOLN
INTRAMUSCULAR | Status: AC | PRN
  Administered 2013-01-03: 50 ug via INTRAVENOUS

## 2013-01-03 MED ORDER — MIDAZOLAM HCL 2 MG/2ML IJ SOLN
INTRAMUSCULAR | Status: AC
  Filled 2013-01-03: qty 4

## 2013-01-03 MED ORDER — SODIUM CHLORIDE 0.9 % IV SOLN: INTRAVENOUS | Status: DC

## 2013-01-03 MED ORDER — MIDAZOLAM HCL 2 MG/2ML IJ SOLN
INTRAMUSCULAR | Status: AC | PRN
  Administered 2013-01-03: 1 mg via INTRAVENOUS

## 2013-01-03 MED ORDER — FENTANYL CITRATE 0.05 MG/ML IJ SOLN
INTRAMUSCULAR | Status: AC
  Filled 2013-01-03: qty 4

## 2013-01-03 NOTE — H&P (Signed)
[name] is an 79 y.o. male.   Chief Complaint: Hx prostate ca Recent back pain prompted MRI Revealed spinal stenosis and coincidental Left renal mass Scheduled now for Left renal mass biopsy  HPI: prostate ca; HLD; gout; glaucoma; DM   Past Medical History  Diagnosis Date  . Cancer     prostate  . Hyperlipidemia   . Gout   . Glaucoma   . Diabetes mellitus without complication     Past Surgical History  Procedure Date  . Appendectomy   . Coronary artery bypass graft   . Prostate surgery     History reviewed. No pertinent family history. Social History:  does not have a smoking history on file. He does not have any smokeless tobacco history on file. His alcohol and drug histories not on file.  Allergies:  Allergies  Allergen Reactions  . Penicillins      (Not in a hospital admission)  Results for orders placed during the hospital encounter of 01/03/13 (from the past 48 hour(s))  APTT     Status: Abnormal   Collection Time   01/03/13 12:56 PM      Component Value Range Comment   aPTT 38 (*) 24 - 37 seconds   CBC     Status: Abnormal   Collection Time   01/03/13 12:56 PM      Component Value Range Comment   WBC 6.7  4.0 - 10.5 K/uL    RBC 4.00 (*) 4.22 - 5.81 MIL/uL    Hemoglobin 13.2  13.0 - 17.0 g/dL    HCT 38.4 (*) 39.0 - 52.0 %    MCV 96.0  78.0 - 100.0 fL    MCH 33.0  26.0 - 34.0 pg    MCHC 34.4  30.0 - 36.0 g/dL    RDW 14.7  11.5 - 15.5 %    Platelets 185  150 - 400 K/uL   PROTIME-INR     Status: Normal   Collection Time   01/03/13 12:56 PM      Component Value Range Comment   Prothrombin Time 13.1  11.6 - 15.2 seconds    INR 1.00  0.00 - 1.49   GLUCOSE, CAPILLARY     Status: Abnormal   Collection Time   01/03/13  1:06 PM      Component Value Range Comment   Glucose-Capillary 114 (*) 70 - 99 mg/dL    Comment 1 Documented in Chart      Comment 2 Notify RN      No results found.  Review of Systems  Constitutional: Negative for fever.    Cardiovascular: Negative for chest pain.  Gastrointestinal: Negative for nausea and vomiting.  Musculoskeletal: Positive for back pain.  Neurological: Negative for headaches.    Blood pressure 115/71, pulse 89, temperature 97.8 F (36.6 C), temperature source Oral, resp. rate 18, height 5' 7.25" (1.708 m), weight 180 lb (81.647 kg), SpO2 95.00%. Physical Exam  Constitutional: He is oriented to person, place, and time. He appears well-developed and well-nourished.  Cardiovascular: Normal rate, regular rhythm and normal heart sounds.   No murmur heard. Respiratory: Effort normal. He has no wheezes.  GI: Soft. Bowel sounds are normal. There is no tenderness.  Musculoskeletal: Normal range of motion.  Neurological: He is alert and oriented to person, place, and time.  Skin: Skin is warm and dry.  Psychiatric: He has a normal mood and affect. His behavior is normal. Judgment and thought content normal.  Assessment/Plan Hx prostate ca New left renal mass Scheduled for biopsy of renal lesion Pt aware of procedure benefits and risks and agreeable to proceed Consent signed and in chart  Deara Bober A 01/03/2013, 1:38 PM

## 2013-01-03 NOTE — ED Notes (Signed)
18 cc's of fluid aspirated from liver

## 2013-01-03 NOTE — H&P (Signed)
Agree 

## 2013-01-03 NOTE — Procedures (Signed)
Procedure:  Ultrasound guided aspiration of left renal lesion Findings:  Cystic lesion of posterior left kidney.  18 mL of fluid removed.  No obvious solid component evident by ultrasound.  Fluid sent for cytologic analysis.

## 2013-02-04 ENCOUNTER — Other Ambulatory Visit: Payer: Self-pay

## 2013-02-06 ENCOUNTER — Other Ambulatory Visit: Payer: Self-pay | Admitting: Dermatology

## 2013-05-08 ENCOUNTER — Other Ambulatory Visit: Payer: Self-pay | Admitting: Dermatology

## 2013-06-01 ENCOUNTER — Other Ambulatory Visit (HOSPITAL_COMMUNITY): Payer: Self-pay | Admitting: Urology

## 2013-06-01 DIAGNOSIS — D49519 Neoplasm of unspecified behavior of unspecified kidney: Secondary | ICD-10-CM

## 2013-07-17 ENCOUNTER — Ambulatory Visit (HOSPITAL_COMMUNITY)
Admission: RE | Admit: 2013-07-17 | Discharge: 2013-07-17 | Disposition: A | Discharge status: Home / Self Care | Payer: Medicare Other | Source: Ambulatory Visit | Attending: Urology | Admitting: Urology

## 2013-07-17 DIAGNOSIS — N289 Disorder of kidney and ureter, unspecified: Secondary | ICD-10-CM | POA: Insufficient documentation

## 2013-07-17 DIAGNOSIS — D49519 Neoplasm of unspecified behavior of unspecified kidney: Secondary | ICD-10-CM

## 2013-07-17 LAB — POCT I-STAT, CHEM 8
BUN: 21 mg/dL (ref 6–23)
Calcium, Ion: 1.17 mmol/L (ref 1.13–1.30)
Chloride: 107 mEq/L (ref 96–112)
Glucose, Bld: 140 mg/dL — ABNORMAL HIGH (ref 70–99)
Potassium: 4.2 mEq/L (ref 3.5–5.1)

## 2013-07-17 MED ORDER — GADOBENATE DIMEGLUMINE 529 MG/ML IV SOLN
10.0000 mL | Freq: Once | INTRAVENOUS | Status: AC | PRN
  Administered 2013-07-17: 10 mL via INTRAVENOUS

## 2013-07-21 ENCOUNTER — Ambulatory Visit (HOSPITAL_COMMUNITY)
Admission: RE | Admit: 2013-07-21 | Discharge: 2013-07-21 | Disposition: A | Discharge status: Home / Self Care | Payer: Medicare Other | Source: Ambulatory Visit | Attending: Urology | Admitting: Urology

## 2013-07-21 ENCOUNTER — Other Ambulatory Visit (HOSPITAL_COMMUNITY): Payer: Self-pay | Admitting: Urology

## 2013-07-21 DIAGNOSIS — N289 Disorder of kidney and ureter, unspecified: Secondary | ICD-10-CM | POA: Insufficient documentation

## 2013-07-21 DIAGNOSIS — Z951 Presence of aortocoronary bypass graft: Secondary | ICD-10-CM | POA: Insufficient documentation

## 2013-07-21 DIAGNOSIS — Z8546 Personal history of malignant neoplasm of prostate: Secondary | ICD-10-CM | POA: Insufficient documentation

## 2013-07-21 DIAGNOSIS — Z01818 Encounter for other preprocedural examination: Secondary | ICD-10-CM | POA: Insufficient documentation

## 2013-07-21 DIAGNOSIS — C61 Malignant neoplasm of prostate: Secondary | ICD-10-CM

## 2013-07-26 ENCOUNTER — Other Ambulatory Visit: Payer: Self-pay | Admitting: Urology

## 2013-07-26 ENCOUNTER — Other Ambulatory Visit: Payer: Self-pay

## 2013-08-18 ENCOUNTER — Encounter (HOSPITAL_COMMUNITY): Payer: Self-pay | Admitting: Pharmacy Technician

## 2013-08-18 ENCOUNTER — Other Ambulatory Visit (HOSPITAL_COMMUNITY): Payer: Self-pay | Admitting: *Deleted

## 2013-08-18 NOTE — Progress Notes (Addendum)
EKG from [facility] 02/06/2013, Rest/Stress test from [facility] 08/02/2013,Surgical Clearance from Dr. Daneen Schick 08/11/201,Echocardiogram from [facility] 01/31/2010,and Surgical clearance from Dr. Daneen Schick from [place] 02/06/2013 all on chart, chest x-ray 07/21/13 on EPIC

## 2013-08-22 ENCOUNTER — Encounter (HOSPITAL_COMMUNITY)
Admission: RE | Admit: 2013-08-22 | Discharge: 2013-08-22 | Disposition: A | Discharge status: Home / Self Care | Payer: Medicare Other | Source: Ambulatory Visit | Attending: Urology | Admitting: Urology

## 2013-08-22 ENCOUNTER — Encounter (HOSPITAL_COMMUNITY): Payer: Self-pay

## 2013-08-22 DIAGNOSIS — Z01812 Encounter for preprocedural laboratory examination: Secondary | ICD-10-CM | POA: Insufficient documentation

## 2013-08-22 HISTORY — DX: Low back pain: M54.5

## 2013-08-22 HISTORY — DX: Polymyalgia rheumatica: M35.3

## 2013-08-22 HISTORY — DX: Personal history of other venous thrombosis and embolism: Z86.718

## 2013-08-22 HISTORY — DX: Gastro-esophageal reflux disease without esophagitis: K21.9

## 2013-08-22 HISTORY — DX: Low back pain, unspecified: M54.50

## 2013-08-22 HISTORY — DX: Atherosclerotic heart disease of native coronary artery without angina pectoris: I25.10

## 2013-08-22 LAB — BASIC METABOLIC PANEL
CO2: 25 mEq/L (ref 19–32)
Calcium: 9.5 mg/dL (ref 8.4–10.5)
Chloride: 107 mEq/L (ref 96–112)
Creatinine, Ser: 1.17 mg/dL (ref 0.50–1.35)
Glucose, Bld: 199 mg/dL — ABNORMAL HIGH (ref 70–99)
Sodium: 141 mEq/L (ref 135–145)

## 2013-08-22 LAB — CBC
HCT: 39.2 % (ref 39.0–52.0)
Hemoglobin: 13.3 g/dL (ref 13.0–17.0)
MCH: 33.8 pg (ref 26.0–34.0)
RBC: 3.94 MIL/uL — ABNORMAL LOW (ref 4.22–5.81)

## 2013-08-22 NOTE — Patient Instructions (Signed)
[facility]  08/22/2013   Your procedure is scheduled on: 08/31/13  Report to [facility] at 5:15 AM.  Call this number if you have problems the morning of surgery [phone]   Remember:   Do not eat food or drink liquids After Midnight.     Take these medicines the morning of surgery with A SIP OF WATER: prilosec, crestor   Do not wear jewelry, make-up or nail polish.  Do not wear lotions, powders, or perfumes. You may wear deodorant.  Do not shave 48 hours prior to surgery. Men may shave face and neck.  Do not bring valuables to the hospital.  Contacts, dentures or bridgework may not be worn into surgery.  Leave suitcase in the car. After surgery it may be brought to your room.  For patients admitted to the hospital, checkout time is 11:00 AM the day of discharge.    Please read over the following fact sheets that you were given: blood fact sheet, incentive spirometry fact sheet Paulette Blanch, RN  pre op nurse call if needed [phone]    FAILURE TO [place]   Patient Signature: ___________________________________________

## 2013-08-30 NOTE — H&P (Signed)
History of Present Illness  [name] is a 79 year old with the following urologic history:  1) Prostate Cancer:  He is followed by Dr. Diona Fanti for cT1c Nx Mx, Gleason 3+4=7 adenocarcinoma of the prostate s/p brachytherapy under the care of Dr. Hessie Diener in February 2010.  His pretreatment PSA was 5.54 and his last PSA was < 0.01 on 11/28/12.  2) BPH/LUTS:  He also has LUTS and is treated with alfusozin and Vesicare.  2) Left renal neoplasm: In November 2013, he underwent a lumbar spine MRI due to back pain symptoms.  This MR demonstrated lumbar stenosis and spondylosis and also incidentally demonstrated a questionable left renal mass.   He then underwent an MRI of the abdomen without contrast (presumably due to concerns about CKD) on 11/04/12 which demonstrated a complex 5.2 x 3.5 cm renal mass in the interpolar region of the left kidney.  However, this mass was poorly characterized with the lack of IV contrast.  He was seen by Dr. Diona Fanti and did eventually have an MRI of the abdomen with and without IV contrast which confirmed a 5.2 x 4.3 cm complex appearing left renal mass in the interpolar region of the left kidney but without definite enhancement. While this was possibly concerning for renal cell carcinoma, it was felt it may be a hemorrhagic cyst s/p bleed or other benign entity. No fat was present to suggest an angiomyolipoma. No regional lymphadenopathy, adrenal tumors, or renal vein/IVC tumor thrombus was identified. There were multiple bilateral simple appearing renal cysts as well. He had denied gross hematuria and has no family history of kidney cancer. I discussed options with him at our initial consultation in January 2013 and I reviewed his MRI with Dr. Jobe Igo in radiology. [name] then proceeded with a biopsy under ultrasound guidance by Dr. Kathlene Cote. A core biopsy could not be performed due to the lack of solid components.  FNA was negative and the cyst fluid was sent for  cytology and was benign. He followed up in July 2014 for surveillance imaging and a repeat MRI at that time demonstrated no significant change in the size or shape of the mass but there did appear to be definite enhancement of this mass noted on this imaging study.  I reviewed the MRI with Dr. Suzy Bouchard who confirmed enhancement was definitely present.  Baseline renal function: Cr 1.58 (eGFR 43 ml/min).  He is followed by Dr. [name] for his CKD.   His comorbidities include a history of coronary artery disease status post CABG and angioplasty. His bypass was performed in 2004. He has been followed by Dr. Pernell Dupre In addition, he was diagnosed with a DVT in the past requiring temporary Coumadin. He is now off all anticoagulation.   Interval history:  [name] follows up today to review his recent chest imaging and laboratory studies and to further discuss surgical treatment of his left renal mass.  He has seen Dr. Tamala Julian and is scheduled to undergo a nuclear medicine stress test and echocardiogram this Thursday.  He has remained asymptomatic and specifically denies any hematuria, chest pain, shortness of breath, dyspnea, palpitations, or other complaints.     Past Medical History Problems  1. History of  Arthritis V13.4 2. History of  Benign Prostatic Hypertrophy With Urinary Obstruction 600.01 3. History of  Diabetes Mellitus 250.00 4. History of  Esophageal Reflux 530.81 5. History of  Glaucoma 365.9 6. History of  Gout 274.9 7. History of  Heart  Disease 429.9 8. History of  Hiatal Hernia 553.3 9. History of  Thrombophlebitis Of Deep Vessels Of The Lower Extremity V12.51  Surgical History Problems  1. History of  Appendectomy 2. History of  CABG (CABG) 3. History of  Previous Balloon Angioplasty 4. History of  Surgery Prostate Transperineal Placement Of Needles 5. History of  Varicose Vein Ligation  Current Meds 1. Alfuzosin HCl ER 10 MG Oral Tablet Extended Release 24  Hour; Take 1 tablet by mouth daily;  Therapy: [id number] to (Evaluate:17Aug2014)  Requested for: 22Aug2013; Last Rx:22Aug2013 2. Allopurinol 300 MG Oral Tablet; Therapy: (Recorded:22Jan2013) to 3. Aspirin 81 MG Oral Tablet; Therapy: (Recorded:29Sep2009) to 4. Calcium + D TABS; Therapy: (Recorded:29Sep2009) to 5. Colcrys 0.6 MG Oral Tablet; Therapy: (Recorded:22Jan2013) to 6. Crestor 10 MG Oral Tablet; Therapy: 09Sep2013 to 7. Fosamax 70 MG Oral Tablet; Therapy: (Recorded:22Jan2013) to 8. Gabapentin 300 MG Oral Capsule; Therapy: 27Jun2014 to 9. GlipiZIDE 10 MG Oral Tablet; Take 1 tablet twice daily; Therapy: (Recorded:29Sep2009) to 10. Indomethacin 50 MG Oral Capsule; Therapy: [id number] to 11. Multi-Vitamin TABS; Therapy: (Recorded:29Sep2009) to 12. Omeprazole 20 MG Oral Capsule Delayed Release; Therapy: 23Feb2012 to 13. VESIcare 10 MG Oral Tablet; take 1 tablet by mouth once daily; Therapy: [id number] to (Last   Rx:21Apr2014)  Requested for: 21Apr2014  Allergies Medication  1. Adacel SUSP 2. Penicillins  Family History Problems  1. Family history of  Death In The Family Father 2. Family history of  Death In The Family Mother  Social History Problems  1. Former Smoker V15.82 smoked 2 ppd for 30 yrs & quit in 1982 2. Marital History - Currently Married 3. Occupation: self-employed-- Psychologist, educational Denied  4. History of  Alcohol Use  Vitals Vital Signs [Data Includes: Last 1 Day]  26Aug2014 01:05PM  BMI Calculated: 28.25 BSA Calculated: 1.93 Height: 5 ft 7 in Weight: 180 lb  Blood Pressure: 142 / 78 Heart Rate: 76  Physical Exam Constitutional: Well nourished and well developed . No acute distress.  ENT:. The ears and nose are normal in appearance.  Neck: The appearance of the neck is normal and no neck mass is present.  Pulmonary: No respiratory distress and normal respiratory rhythm and effort.  Cardiovascular: Heart rate and rhythm are normal . No peripheral edema.   Abdomen: The abdomen is soft and nontender. No masses are palpated. No CVA tenderness. No hernias are palpable. No hepatosplenomegaly noted.  Lymphatics: The femoral and inguinal nodes are not enlarged or tender.  Skin: Normal skin turgor, no visible rash and no visible skin lesions.  Neuro/Psych:. Mood and affect are appropriate.    Results/Data Selected Results  PSA 01Aug2014 09:33AM Raynelle Bring  SPECIMEN TYPE: BLOOD   Test Name Result Flag Reference  PSA 0.01 ng/mL  <=4.00  TEST METHODOLOGY: ECLIA PSA (ELECTROCHEMILUMINESCENCE IMMUNOASSAY)   COMPREHENSIVE METABOLIC PANEL [id number] Raynelle Bring  SPECIMEN TYPE: BLOOD   Test Name Result Flag Reference  GLUCOSE 165 mg/dL H 70-99  BUN 19 mg/dL  6-23  CREATININE 1.50 mg/dL  0.50-1.50  SODIUM 139 mEq/L  135-145  POTASSIUM 4.2 mEq/L  3.5-5.3  CHLORIDE 103 mEq/L  96-112  CO2 26 mEq/L  19-32  CALCIUM 9.3 mg/dL  8.4-10.5  TOTAL PROTEIN 6.4 g/dL  6.0-8.3  ALBUMIN 4.0 g/dL  3.5-5.2  AST/SGOT 15 U/L  0-37  ALT/SGPT 13 U/L  0-53  ALKALINE PHOSPHATASE 48 U/L  39-117  BILIRUBIN, TOTAL 0.9 mg/dL  0.3-1.2  Est GFR, African American 52 mL/min L   Est  GFR, NonAfrican American 45 mL/min L   THE ESTIMATED GFR IS A CALCULATION VALID FOR ADULTS (>=55 YEARS OLD) THAT USES THE CKD-EPI ALGORITHM TO ADJUST FOR AGE AND SEX. IT IS   NOT TO BE USED FOR CHILDREN, PREGNANT WOMEN, HOSPITALIZED PATIENTS,    PATIENTS ON DIALYSIS, OR WITH RAPIDLY CHANGING KIDNEY FUNCTION. ACCORDING TO THE NKDEP, EGFR >89 IS NORMAL, 60-89 SHOWS MILD IMPAIRMENT, 30-59 SHOWS MODERATE IMPAIRMENT, 15-29 SHOWS SEVERE IMPAIRMENT AND <15 IS ESRD.     I independently reviewed his chest x-ray which demonstrates no concern for pulmonary metastases.  Plan Renal Neoplasm (239.5)  1. Follow-up Keep Future Appt Office  Follow-up  Done: 26Aug2014  Discussion/Summary  1.  Left renal neoplasm concerning for malignancy: His metastatic evaluation is negative.  He is  completing his cardiac evaluation and hopefully will be cleared to proceed with his left robotic-assisted laparoscopic partial nephrectomy.   The patient was provided information regarding their renal mass including the relative risk of benign versus malignant pathology and the natural history of renal cell carcinoma and other possible malignancies of the kidney. The role of renal biopsy, laboratory testing, and imaging studies to further characterize renal masses and/or the presence of metastatic disease were explained. We discussed the role of active surveillance, surgical therapy with both radical nephrectomy and nephron-sparing surgery, and ablative therapy in the treatment of renal masses. In addition, we discussed our goals of providing an accurate diagnosis and oncologic control while maintaining optimal renal function as appropriate based on the size, location, and complexity of their renal mass as well as their co-morbidities.    We have discussed the risks of treatment in detail including but not limited to bleeding, infection, heart attack, stroke, death, venothromoboembolism, cancer recurrence, injury/damage to surrounding organs and structures, urine leak, the possibility of open surgical conversion for patients undergoing minimally invasive surgery, the risk of developing chronic kidney disease and its associated implications, and the potential risk of end stage renal disease possibly necessitating dialysis.   All questions were answered to his stated satisfaction today.  He is ready to proceed as planned.  We specifically discussed the importance of following his renal function postoperatively considering his baseline chronic kidney disease.  2.  Prostate cancer: We reviewed his PSA which is undetectable.  He will be due for his next PSA in 6 months.  Cc: Dr. Pernell Dupre Dr. [name] Dr. Lajean Manes  A total of 45 minutes were spent in the overall care of the patient today with minutes  in direct face to face consultation.    Signatures Electronically signed by : Raynelle Bring, M.D.; Aug 15 2013  1:42PM

## 2013-08-31 ENCOUNTER — Encounter (HOSPITAL_COMMUNITY): Payer: Self-pay | Admitting: Anesthesiology

## 2013-08-31 ENCOUNTER — Encounter (HOSPITAL_COMMUNITY): Payer: Self-pay | Admitting: *Deleted

## 2013-08-31 ENCOUNTER — Inpatient Hospital Stay (HOSPITAL_COMMUNITY): Payer: Medicare Other | Admitting: Anesthesiology

## 2013-08-31 ENCOUNTER — Inpatient Hospital Stay (HOSPITAL_COMMUNITY)
Admission: RE | Admit: 2013-08-31 | Discharge: 2013-09-03 | DRG: 658 | Disposition: A | Discharge status: Home / Self Care | Payer: Medicare Other | Source: Ambulatory Visit | Attending: Urology | Admitting: Urology

## 2013-08-31 ENCOUNTER — Encounter (HOSPITAL_COMMUNITY)
Admission: RE | Disposition: A | Discharge status: Home / Self Care | Payer: Self-pay | Source: Ambulatory Visit | Attending: Urology

## 2013-08-31 DIAGNOSIS — Z8546 Personal history of malignant neoplasm of prostate: Secondary | ICD-10-CM

## 2013-08-31 DIAGNOSIS — C649 Malignant neoplasm of unspecified kidney, except renal pelvis: Principal | ICD-10-CM | POA: Diagnosis present

## 2013-08-31 DIAGNOSIS — N281 Cyst of kidney, acquired: Secondary | ICD-10-CM | POA: Diagnosis present

## 2013-08-31 DIAGNOSIS — N138 Other obstructive and reflux uropathy: Secondary | ICD-10-CM | POA: Diagnosis present

## 2013-08-31 DIAGNOSIS — Z87891 Personal history of nicotine dependence: Secondary | ICD-10-CM

## 2013-08-31 DIAGNOSIS — Z7982 Long term (current) use of aspirin: Secondary | ICD-10-CM

## 2013-08-31 DIAGNOSIS — M129 Arthropathy, unspecified: Secondary | ICD-10-CM | POA: Diagnosis present

## 2013-08-31 DIAGNOSIS — Z86718 Personal history of other venous thrombosis and embolism: Secondary | ICD-10-CM

## 2013-08-31 DIAGNOSIS — R000 Tachycardia, unspecified: Secondary | ICD-10-CM | POA: Diagnosis not present

## 2013-08-31 DIAGNOSIS — Z01812 Encounter for preprocedural laboratory examination: Secondary | ICD-10-CM

## 2013-08-31 DIAGNOSIS — M109 Gout, unspecified: Secondary | ICD-10-CM | POA: Diagnosis present

## 2013-08-31 DIAGNOSIS — Z880 Allergy status to penicillin: Secondary | ICD-10-CM

## 2013-08-31 DIAGNOSIS — N189 Chronic kidney disease, unspecified: Secondary | ICD-10-CM | POA: Diagnosis present

## 2013-08-31 DIAGNOSIS — Z79899 Other long term (current) drug therapy: Secondary | ICD-10-CM

## 2013-08-31 DIAGNOSIS — I2510 Atherosclerotic heart disease of native coronary artery without angina pectoris: Secondary | ICD-10-CM | POA: Diagnosis present

## 2013-08-31 DIAGNOSIS — N401 Enlarged prostate with lower urinary tract symptoms: Secondary | ICD-10-CM | POA: Diagnosis present

## 2013-08-31 DIAGNOSIS — H409 Unspecified glaucoma: Secondary | ICD-10-CM | POA: Diagnosis present

## 2013-08-31 DIAGNOSIS — K219 Gastro-esophageal reflux disease without esophagitis: Secondary | ICD-10-CM | POA: Diagnosis present

## 2013-08-31 DIAGNOSIS — E119 Type 2 diabetes mellitus without complications: Secondary | ICD-10-CM | POA: Diagnosis present

## 2013-08-31 HISTORY — PX: ROBOTIC ASSITED PARTIAL NEPHRECTOMY: SHX6087

## 2013-08-31 LAB — HEMOGLOBIN AND HEMATOCRIT, BLOOD
HCT: 36.8 % — ABNORMAL LOW (ref 39.0–52.0)
Hemoglobin: 12.4 g/dL — ABNORMAL LOW (ref 13.0–17.0)

## 2013-08-31 LAB — GLUCOSE, CAPILLARY
Glucose-Capillary: 118 mg/dL — ABNORMAL HIGH (ref 70–99)
Glucose-Capillary: 152 mg/dL — ABNORMAL HIGH (ref 70–99)
Glucose-Capillary: 326 mg/dL — ABNORMAL HIGH (ref 70–99)

## 2013-08-31 LAB — BASIC METABOLIC PANEL
Calcium: 9 mg/dL (ref 8.4–10.5)
GFR calc Af Amer: 48 mL/min — ABNORMAL LOW (ref >90)
GFR calc non Af Amer: 41 mL/min — ABNORMAL LOW (ref >90)
Sodium: 137 mEq/L (ref 135–145)

## 2013-08-31 LAB — TYPE AND SCREEN: ABO/RH(D): O POS

## 2013-08-31 SURGERY — ROBOTIC ASSITED PARTIAL NEPHRECTOMY
Anesthesia: General | Site: Abdomen | Laterality: Left | Wound class: Clean

## 2013-08-31 MED ORDER — DIPHENHYDRAMINE HCL 50 MG/ML IJ SOLN: 12.5000 mg | Freq: Four times a day (QID) | INTRAMUSCULAR | Status: DC | PRN

## 2013-08-31 MED ORDER — METOCLOPRAMIDE HCL 5 MG/ML IJ SOLN
INTRAMUSCULAR | Status: DC | PRN
  Administered 2013-08-31: 10 mg via INTRAVENOUS

## 2013-08-31 MED ORDER — DIPHENHYDRAMINE HCL 12.5 MG/5ML PO ELIX: 12.5000 mg | ORAL_SOLUTION | Freq: Four times a day (QID) | ORAL | Status: DC | PRN

## 2013-08-31 MED ORDER — PHENYLEPHRINE HCL 10 MG/ML IJ SOLN
INTRAMUSCULAR | Status: DC | PRN
  Administered 2013-08-31: 120 ug via INTRAVENOUS
  Administered 2013-08-31: 40 ug via INTRAVENOUS
  Administered 2013-08-31 (×2): 120 ug via INTRAVENOUS

## 2013-08-31 MED ORDER — EPHEDRINE SULFATE 50 MG/ML IJ SOLN
INTRAMUSCULAR | Status: DC | PRN
  Administered 2013-08-31: 5 mg via INTRAVENOUS
  Administered 2013-08-31 (×2): 15 mg via INTRAVENOUS
  Administered 2013-08-31: 10 mg via INTRAVENOUS

## 2013-08-31 MED ORDER — PHENYLEPHRINE HCL 10 MG/ML IJ SOLN
20.0000 mg | INTRAVENOUS | Status: DC | PRN
  Administered 2013-08-31: 50 ug/min via INTRAVENOUS

## 2013-08-31 MED ORDER — DEXTROSE-NACL 5-0.45 % IV SOLN
INTRAVENOUS | Status: DC
  Administered 2013-08-31 – 2013-09-02 (×5): via INTRAVENOUS

## 2013-08-31 MED ORDER — INSULIN ASPART 100 UNIT/ML ~~LOC~~ SOLN
0.0000 [IU] | SUBCUTANEOUS | Status: DC
  Administered 2013-08-31 (×2): 11 [IU] via SUBCUTANEOUS
  Administered 2013-09-01: 3 [IU] via SUBCUTANEOUS
  Administered 2013-09-01: 5 [IU] via SUBCUTANEOUS
  Administered 2013-09-01: 3 [IU] via SUBCUTANEOUS
  Administered 2013-09-01: 5 [IU] via SUBCUTANEOUS
  Administered 2013-09-01: 3 [IU] via SUBCUTANEOUS
  Administered 2013-09-01: 5 [IU] via SUBCUTANEOUS
  Administered 2013-09-01: 8 [IU] via SUBCUTANEOUS
  Administered 2013-09-02: 2 [IU] via SUBCUTANEOUS
  Administered 2013-09-02 (×2): 3 [IU] via SUBCUTANEOUS

## 2013-08-31 MED ORDER — MIDAZOLAM HCL 5 MG/5ML IJ SOLN
INTRAMUSCULAR | Status: DC | PRN
  Administered 2013-08-31: 2 mg via INTRAVENOUS

## 2013-08-31 MED ORDER — LACTATED RINGERS IR SOLN
Status: DC | PRN
  Administered 2013-08-31: 1000 mL

## 2013-08-31 MED ORDER — ACETAMINOPHEN 10 MG/ML IV SOLN
1000.0000 mg | Freq: Four times a day (QID) | INTRAVENOUS | Status: DC
  Administered 2013-08-31 – 2013-09-01 (×3): 1000 mg via INTRAVENOUS
  Filled 2013-08-31 (×4): qty 100

## 2013-08-31 MED ORDER — MANNITOL 25 % IV SOLN
25.0000 g | Freq: Once | INTRAVENOUS | Status: AC
  Administered 2013-08-31 (×2): 12.5 g via INTRAVENOUS
  Filled 2013-08-31: qty 100

## 2013-08-31 MED ORDER — ALFUZOSIN HCL ER 10 MG PO TB24
10.0000 mg | ORAL_TABLET | Freq: Every day | ORAL | Status: DC
  Administered 2013-08-31 – 2013-09-03 (×4): 10 mg via ORAL
  Filled 2013-08-31 (×4): qty 1

## 2013-08-31 MED ORDER — NALOXONE HCL 0.4 MG/ML IJ SOLN
INTRAMUSCULAR | Status: DC | PRN
  Administered 2013-08-31 (×3): .04 mg via INTRAVENOUS

## 2013-08-31 MED ORDER — HEPARIN SODIUM (PORCINE) 1000 UNIT/ML IJ SOLN
INTRAMUSCULAR | Status: AC
  Filled 2013-08-31: qty 1

## 2013-08-31 MED ORDER — ONDANSETRON HCL 4 MG/2ML IJ SOLN
4.0000 mg | INTRAMUSCULAR | Status: DC | PRN
  Administered 2013-08-31 – 2013-09-03 (×4): 4 mg via INTRAVENOUS
  Filled 2013-08-31 (×3): qty 2

## 2013-08-31 MED ORDER — VANCOMYCIN HCL IN DEXTROSE 1-5 GM/200ML-% IV SOLN
INTRAVENOUS | Status: AC
  Filled 2013-08-31: qty 200

## 2013-08-31 MED ORDER — GLYCOPYRROLATE 0.2 MG/ML IJ SOLN
INTRAMUSCULAR | Status: DC | PRN
  Administered 2013-08-31: 0.6 mg via INTRAVENOUS

## 2013-08-31 MED ORDER — LACTATED RINGERS IV SOLN
INTRAVENOUS | Status: DC | PRN
  Administered 2013-08-31 (×2): via INTRAVENOUS

## 2013-08-31 MED ORDER — HYDROMORPHONE HCL PF 1 MG/ML IJ SOLN
INTRAMUSCULAR | Status: DC | PRN
  Administered 2013-08-31 (×2): 2 mg via INTRAVENOUS

## 2013-08-31 MED ORDER — PROMETHAZINE HCL 25 MG/ML IJ SOLN: 6.2500 mg | INTRAMUSCULAR | Status: DC | PRN

## 2013-08-31 MED ORDER — MORPHINE SULFATE 2 MG/ML IJ SOLN
2.0000 mg | INTRAMUSCULAR | Status: DC | PRN
  Administered 2013-08-31 – 2013-09-01 (×2): 2 mg via INTRAVENOUS
  Filled 2013-08-31 (×2): qty 1

## 2013-08-31 MED ORDER — ROCURONIUM BROMIDE 100 MG/10ML IV SOLN
INTRAVENOUS | Status: DC | PRN
  Administered 2013-08-31: 50 mg via INTRAVENOUS
  Administered 2013-08-31: 10 mg via INTRAVENOUS

## 2013-08-31 MED ORDER — PANTOPRAZOLE SODIUM 40 MG PO TBEC
40.0000 mg | DELAYED_RELEASE_TABLET | Freq: Every day | ORAL | Status: DC
  Administered 2013-09-01 – 2013-09-03 (×3): 40 mg via ORAL
  Filled 2013-08-31 (×3): qty 1

## 2013-08-31 MED ORDER — VANCOMYCIN HCL IN DEXTROSE 1-5 GM/200ML-% IV SOLN
1000.0000 mg | INTRAVENOUS | Status: AC
  Administered 2013-08-31: 1000 mg via INTRAVENOUS

## 2013-08-31 MED ORDER — ZOLPIDEM TARTRATE 5 MG PO TABS
5.0000 mg | ORAL_TABLET | Freq: Every evening | ORAL | Status: DC | PRN
  Administered 2013-09-01: 5 mg via ORAL
  Filled 2013-08-31: qty 1

## 2013-08-31 MED ORDER — SODIUM CHLORIDE 0.9 % IJ SOLN
INTRAMUSCULAR | Status: AC
  Filled 2013-08-31: qty 20

## 2013-08-31 MED ORDER — SODIUM CHLORIDE 0.9 % IJ SOLN
INTRAMUSCULAR | Status: DC | PRN
  Administered 2013-08-31: 11:00:00

## 2013-08-31 MED ORDER — ONDANSETRON HCL 4 MG/2ML IJ SOLN
INTRAMUSCULAR | Status: DC | PRN
  Administered 2013-08-31: 4 mg via INTRAVENOUS

## 2013-08-31 MED ORDER — PROPOFOL 10 MG/ML IV BOLUS
INTRAVENOUS | Status: DC | PRN
  Administered 2013-08-31: 120 mg via INTRAVENOUS

## 2013-08-31 MED ORDER — NEOSTIGMINE METHYLSULFATE 1 MG/ML IJ SOLN
INTRAMUSCULAR | Status: DC | PRN
  Administered 2013-08-31: 5 mg via INTRAVENOUS

## 2013-08-31 MED ORDER — STERILE WATER FOR IRRIGATION IR SOLN
Status: DC | PRN
  Administered 2013-08-31: 3000 mL

## 2013-08-31 MED ORDER — LACTATED RINGERS IV SOLN: INTRAVENOUS | Status: DC

## 2013-08-31 MED ORDER — VANCOMYCIN HCL IN DEXTROSE 1-5 GM/200ML-% IV SOLN
1000.0000 mg | Freq: Two times a day (BID) | INTRAVENOUS | Status: AC
  Administered 2013-08-31: 1000 mg via INTRAVENOUS
  Filled 2013-08-31: qty 200

## 2013-08-31 MED ORDER — FENTANYL CITRATE 0.05 MG/ML IJ SOLN: 25.0000 ug | INTRAMUSCULAR | Status: DC | PRN

## 2013-08-31 MED ORDER — HYDROCODONE-ACETAMINOPHEN 5-325 MG PO TABS: 1.0000 | ORAL_TABLET | Freq: Four times a day (QID) | ORAL | Status: DC | PRN

## 2013-08-31 MED ORDER — MEPERIDINE HCL 50 MG/ML IJ SOLN: 6.2500 mg | INTRAMUSCULAR | Status: DC | PRN

## 2013-08-31 MED ORDER — ATORVASTATIN CALCIUM 40 MG PO TABS
40.0000 mg | ORAL_TABLET | Freq: Every day | ORAL | Status: DC
  Filled 2013-08-31: qty 1

## 2013-08-31 MED ORDER — BUPIVACAINE-EPINEPHRINE PF 0.25-1:200000 % IJ SOLN
INTRAMUSCULAR | Status: AC
  Filled 2013-08-31: qty 30

## 2013-08-31 MED ORDER — ROSUVASTATIN CALCIUM 20 MG PO TABS
20.0000 mg | ORAL_TABLET | Freq: Every day | ORAL | Status: DC
  Administered 2013-08-31 – 2013-09-02 (×3): 20 mg via ORAL
  Filled 2013-08-31 (×4): qty 1

## 2013-08-31 MED ORDER — BUPIVACAINE LIPOSOME 1.3 % IJ SUSP
20.0000 mL | Freq: Once | INTRAMUSCULAR | Status: DC
  Filled 2013-08-31: qty 20

## 2013-08-31 MED ORDER — INFLUENZA VAC SPLIT QUAD 0.5 ML IM SUSP
0.5000 mL | INTRAMUSCULAR | Status: AC
  Administered 2013-09-01: 0.5 mL via INTRAMUSCULAR
  Filled 2013-08-31 (×2): qty 0.5

## 2013-08-31 MED ORDER — FENTANYL CITRATE 0.05 MG/ML IJ SOLN
INTRAMUSCULAR | Status: DC | PRN
  Administered 2013-08-31 (×3): 50 ug via INTRAVENOUS
  Administered 2013-08-31: 100 ug via INTRAVENOUS

## 2013-08-31 MED ORDER — ALLOPURINOL 300 MG PO TABS
300.0000 mg | ORAL_TABLET | Freq: Every morning | ORAL | Status: DC
  Administered 2013-09-01 – 2013-09-02 (×2): 300 mg via ORAL
  Filled 2013-08-31 (×2): qty 1

## 2013-08-31 MED ORDER — KETAMINE HCL 10 MG/ML IJ SOLN
INTRAMUSCULAR | Status: DC | PRN
  Administered 2013-08-31: 75 mg via INTRAVENOUS

## 2013-08-31 MED ORDER — DOCUSATE SODIUM 100 MG PO CAPS
100.0000 mg | ORAL_CAPSULE | Freq: Two times a day (BID) | ORAL | Status: DC
  Administered 2013-09-01 – 2013-09-03 (×6): 100 mg via ORAL
  Filled 2013-08-31 (×8): qty 1

## 2013-08-31 SURGICAL SUPPLY — 57 items
APPLICATOR SURGIFLO ENDO (HEMOSTASIS) ×2 IMPLANT
CHLORAPREP W/TINT 26ML (MISCELLANEOUS) ×2 IMPLANT
CLIP LIGATING HEM O LOK PURPLE (MISCELLANEOUS) ×2 IMPLANT
CLIP LIGATING HEMO O LOK GREEN (MISCELLANEOUS) ×4 IMPLANT
CLOTH BEACON ORANGE TIMEOUT ST (SAFETY) ×2 IMPLANT
CORDS BIPOLAR (ELECTRODE) ×2 IMPLANT
COVER SURGICAL LIGHT HANDLE (MISCELLANEOUS) ×2 IMPLANT
COVER TIP SHEARS 8 DVNC (MISCELLANEOUS) ×1 IMPLANT
COVER TIP SHEARS 8MM DA VINCI (MISCELLANEOUS) ×1
DECANTER SPIKE VIAL GLASS SM (MISCELLANEOUS) ×2 IMPLANT
DERMABOND ADVANCED (GAUZE/BANDAGES/DRESSINGS) ×2
DERMABOND ADVANCED .7 DNX12 (GAUZE/BANDAGES/DRESSINGS) ×2 IMPLANT
DRAIN CHANNEL 15F RND FF 3/16 (WOUND CARE) ×2 IMPLANT
DRAPE INCISE IOBAN 66X45 STRL (DRAPES) ×2 IMPLANT
DRAPE LAPAROSCOPIC ABDOMINAL (DRAPES) ×2 IMPLANT
DRAPE LG THREE QUARTER DISP (DRAPES) ×4 IMPLANT
DRAPE TABLE BACK 44X90 PK DISP (DRAPES) ×2 IMPLANT
DRAPE WARM FLUID 44X44 (DRAPE) ×2 IMPLANT
DRESSING SURGICEL FIBRLLR 1X2 (HEMOSTASIS) IMPLANT
DRSG SURGICEL FIBRILLAR 1X2 (HEMOSTASIS)
ELECT REM PT RETURN 9FT ADLT (ELECTROSURGICAL) ×2
ELECTRODE REM PT RTRN 9FT ADLT (ELECTROSURGICAL) ×1 IMPLANT
EVACUATOR SILICONE 100CC (DRAIN) ×2 IMPLANT
GAUZE VASELINE 3X9 (GAUZE/BANDAGES/DRESSINGS) IMPLANT
GLOVE BIO SURGEON STRL SZ 6.5 (GLOVE) ×2 IMPLANT
GLOVE BIOGEL M STRL SZ7.5 (GLOVE) ×4 IMPLANT
GOWN STRL NON-REIN LRG LVL3 (GOWN DISPOSABLE) ×4 IMPLANT
GOWN STRL REIN XL XLG (GOWN DISPOSABLE) ×2 IMPLANT
HEMOSTAT SURGICEL 4X8 (HEMOSTASIS) IMPLANT
KIT ACCESSORY DA VINCI DISP (KITS) ×1
KIT ACCESSORY DVNC DISP (KITS) ×1 IMPLANT
KIT BASIN OR (CUSTOM PROCEDURE TRAY) ×2 IMPLANT
PENCIL BUTTON HOLSTER BLD 10FT (ELECTRODE) ×2 IMPLANT
POSITIONER SURGICAL ARM (MISCELLANEOUS) ×4 IMPLANT
POUCH SPECIMEN RETRIEVAL 10MM (ENDOMECHANICALS) ×2 IMPLANT
SET TUBE IRRIG SUCTION NO TIP (IRRIGATION / IRRIGATOR) IMPLANT
SOLUTION ANTI FOG 6CC (MISCELLANEOUS) ×2 IMPLANT
SOLUTION ELECTROLUBE (MISCELLANEOUS) ×2 IMPLANT
SPONGE LAP 18X18 X RAY DECT (DISPOSABLE) ×2 IMPLANT
SURGIFLO W/THROMBIN 8M KIT (HEMOSTASIS) ×2 IMPLANT
SUT ETHILON 3 0 PS 1 (SUTURE) ×2 IMPLANT
SUT MNCRL AB 4-0 PS2 18 (SUTURE) ×4 IMPLANT
SUT V-LOC BARB 180 2/0GR6 GS22 (SUTURE) ×2
SUT VIC AB 0 CT1 27 (SUTURE)
SUT VIC AB 0 CT1 27XBRD ANTBC (SUTURE) IMPLANT
SUT VIC AB 0 CT1 36 (SUTURE) ×2 IMPLANT
SUT VICRYL 0 UR6 27IN ABS (SUTURE) ×4 IMPLANT
SUT VLOC BARB 180 ABS3/0GR12 (SUTURE) ×4
SUTURE V-LC BRB 180 2/0GR6GS22 (SUTURE) ×1 IMPLANT
SUTURE VLOC BRB 180 ABS3/0GR12 (SUTURE) ×2 IMPLANT
TOWEL OR NON WOVEN STRL DISP B (DISPOSABLE) ×2 IMPLANT
TRAY FOLEY CATH 16FRSI W/METER (SET/KITS/TRAYS/PACK) ×2 IMPLANT
TRAY LAP CHOLE (CUSTOM PROCEDURE TRAY) ×2 IMPLANT
TROCAR ENDOPATH XCEL 12X100 BL (ENDOMECHANICALS) ×2 IMPLANT
TROCAR XCEL 12X100 BLDLESS (ENDOMECHANICALS) ×2 IMPLANT
TUBING INSUFFLATION 10FT LAP (TUBING) ×2 IMPLANT
WATER STERILE IRR 1500ML POUR (IV SOLUTION) ×4 IMPLANT

## 2013-08-31 NOTE — Transfer of Care (Signed)
Immediate Anesthesia Transfer of Care Note  Patient: [name]  Procedure(s) Performed: Procedure(s): ROBOTIC ASSITED PARTIAL NEPHRECTOMY (Left)  Patient Location: PACU  Anesthesia Type:General  Level of Consciousness: awake, sedated and responds to stimulation  Airway & Oxygen Therapy: Patient Spontanous Breathing and Patient connected to face mask oxygen, adequate ventilation  Post-op Assessment: Report given to PACU RN, Post -op Vital signs reviewed and stable and Patient moving all extremities X 4  Post vital signs: Reviewed and stable  Complications: No apparent anesthesia complications

## 2013-08-31 NOTE — Preoperative (Signed)
Beta Blockers   Reason not to administer Beta Blockers:Not Applicable, not on home BB 

## 2013-08-31 NOTE — Anesthesia Postprocedure Evaluation (Signed)
  Anesthesia Post-op Note  Patient: [name]  Procedure(s) Performed: Procedure(s) (LRB): ROBOTIC ASSITED PARTIAL NEPHRECTOMY (Left)  Patient Location: PACU  Anesthesia Type: General  Level of Consciousness: awake and alert   Airway and Oxygen Therapy: Patient Spontanous Breathing  Post-op Pain: mild  Post-op Assessment: Post-op Vital signs reviewed, Patient's Cardiovascular Status Stable, Respiratory Function Stable, Patent Airway and No signs of Nausea or vomiting  Last Vitals:  Filed Vitals:   08/31/13 1245  BP: 144/73  Pulse: 107  Temp: 37.6 C  Resp: 10    Post-op Vital Signs: stable   Complications: No apparent anesthesia complications

## 2013-08-31 NOTE — Op Note (Signed)
Preoperative diagnosis: Left renal neoplasm  Postoperative diagnosis: Left renal neoplasm  Procedure:  1. Left robotic-assisted laparoscopic partial nephrectomy  Surgeon: Pryor Curia. M.D.  Assistant(s): Leta Baptist, PA-C  Anesthesia: General  Complications: None  EBL: 150 mL  IVF:  1200 mL crystalloid  Specimens: 1. Left renal neoplasm  Disposition of specimens: Pathology  Intraoperative findings:       1. Warm renal ischemia time: 27 minutes  Drains: 1. # 15 Blake perinephric drain  Indication:  [name] is a 79 y.o. year old patient with a left renal neoplasm.  After a thorough review of the management options for their renal mass, they elected to proceed with surgical treatment and the above procedure.  We have discussed the potential benefits and risks of the procedure, side effects of the proposed treatment, the likelihood of the patient achieving the goals of the procedure, and any potential problems that might occur during the procedure or recuperation. Informed consent has been obtained.   Description of procedure:  The patient was taken to the operating room and a general anesthetic was administered. The patient was given preoperative antibiotics, placed in the left modified flank position with care to pad all potential pressure points, and prepped and draped in the usual sterile fashion. Next a preoperative timeout was performed.  A site was selected on the left side of the umbilicus for placement of the camera port. This was placed using a standard open Hassan technique which allowed entry into the peritoneal cavity under direct vision and without difficulty. A 12 mm port was placed and a pneumoperitoneum established. The camera was then used to inspect the abdomen and there was no evidence of any intra-abdominal injuries or other abnormalities. The remaining abdominal ports were then placed. 8 mm robotic ports were placed in the left upper  quadrant, left lower quadrant, and far left lateral abdominal wall. A 12 mm port was placed in the upper midline for laparoscopic assistance. All ports were placed under direct vision without difficulty. The surgical cart was then docked.   Utilizing the cautery scissors, the white line of Toldt was incised allowing the colon to be mobilized medially and the plane between the mesocolon and the anterior layer of Gerota's fascia to be developed and the kidney to be exposed.  The ureter and gonadal vein were identified inferiorly and the ureter was lifted anteriorly off the psoas muscle.  Dissection proceeded superiorly along the gonadal vein until the renal vein was identified.  The renal hilum was then carefully isolated with a combination of blunt and sharp dissection allowing the renal arterial and venous structures to be separated and isolated in preparation for renal hilar vessel clamping. There was a single renal artery and renal vein.  12.5 g of IV mannitol was then administered.   Attention turned to the kidney and the perinephric fat surrounding the renal mass was removed and the kidney was mobilized sufficiently for exposure and resection of the renal mass. The perinephric fat was extremely adherent to the renal capsule making this dissection tedious.  Once the renal mass was properly isolated, preparations were made for resection of the tumor.  Reconstructive sutures were placed into the abdomen for the renorrhaphy portion of the procedure.  The renal artery was then clamped with bulldog clamps.  The tumor was then excised with cold scissor dissection along with an adequate visible gross margin of normal renal parenchyma. The tumor appeared to be excised without any gross violation of  the tumor. The renal collecting system was entered in two places during removal of the tumor.  Two running 3-0 V-lock sutures were then brought through the capsule of the kidney and run along the base of the renal defect  to provide hemostasis and close any entry into the renal collecting system if present. Weck clips were used to secure this suture outside the renal capsule at the proximal and distal ends. An additional hemostatic agent (Surgiflo) was then placed into the renal defect. A running 2-0 V lock suture was then used to close the renal capsule using a sliding clip technique which resulted in excellent compression of the renal defect.    The bulldog clamps were then removed from the renal hilar vessel(s) and an additional 12.5 g of IV mannitol was administered. Total warm renal ischemia time was 27 minutes. The renal tumor resection site was examined. Hemostasis appeared adequate.   The kidney was placed back into its normal anatomic position and covered with perinephric fat as needed.  A # 50 Blake drain was then brought through the lateral lower port site and positioned in the perinephric space.  It was secured to the skin with a nylon suture. The surgical cart was undocked.  The renal tumor specimen was removed intact within an endopouch retrieval bag via the upper midline assistant port site which was slightly extended.  The camera port site and the other 12 mm port site were then closed at the fascial level with 0-vicryl suture.  All other laparoscopic/robotic ports were removed under direct vision and the pneumoperitoneum let down with inspection of the operative field performed and hemostasis again confirmed. All incision sites were then injected with local anesthetic and reapproximated at the skin level with 4-0 monocryl subcuticular closures.  Dermabond was applied to the skin.  The patient tolerated the procedure well and without complications.  The patient was able to be extubated and transferred to the recovery unit in satisfactory condition.  Pryor Curia MD

## 2013-08-31 NOTE — Progress Notes (Signed)
Patient ID: [name], male   DOB: 1934/05/05, 79 y.o.   MRN: [id number] Post-op note  Subjective: The patient is doing well.  No complaints.  Still very sleepy  Objective: Vital signs in last 24 hours: Temp:  [97.4 F (36.3 C)-99.7 F (37.6 C)] 99.7 F (37.6 C) (09/11 1245) Pulse Rate:  [94-107] 107 (09/11 1245) Resp:  [10-18] 10 (09/11 1245) BP: (123-168)/(67-82) 144/73 mmHg (09/11 1245) SpO2:  [94 %-100 %] 94 % (09/11 1245) Weight:  [83.6 kg (184 lb 4.9 oz)] 83.6 kg (184 lb 4.9 oz) (09/11 1245)  Intake/Output from previous day:   Intake/Output this shift: Total I/O In: 1500 [I.V.:1500] Out: 855 [Urine:550; Drains:155; Blood:150]  Physical Exam:  General: oriented; very sleepy Abdomen: Soft, Nondistended. Incisions: Clean and dry.  Lab Results:  Recent Labs  08/31/13 1204  HGB 12.4*  HCT 36.8*    Assessment/Plan: POD#0   1) Continue to monitor 2) DVT prophy, clears, bed rest, IS, pain control    LOS: 0 days   YARBROUGH,Adolfo Granieri G. 08/31/2013, 2:44 PM

## 2013-08-31 NOTE — Anesthesia Preprocedure Evaluation (Addendum)
Anesthesia Evaluation  Patient identified by MRN, date of birth, ID band Patient awake    Reviewed: Allergy & Precautions, H&P , NPO status , Patient's Chart, lab work & pertinent test results  Airway Mallampati: II TM Distance: >3 FB Neck ROM: Full    Dental no notable dental hx. (+) Partial Lower and Partial Upper   Pulmonary former smoker,  breath sounds clear to auscultation  Pulmonary exam normal       Cardiovascular + CAD, + Past MI, + CABG (2007) and DVT Rhythm:Regular Rate:Normal     Neuro/Psych negative neurological ROS  negative psych ROS   GI/Hepatic Neg liver ROS, GERD-  Medicated and Controlled,  Endo/Other  diabetes, Type 2, Oral Hypoglycemic Agents  Renal/GU negative Renal ROS  negative genitourinary   Musculoskeletal negative musculoskeletal ROS (+)   Abdominal   Peds negative pediatric ROS (+)  Hematology negative hematology ROS (+)   Anesthesia Other Findings   Reproductive/Obstetrics negative OB ROS                          Anesthesia Physical Anesthesia Plan  ASA: III  Anesthesia Plan: General   Post-op Pain Management:    Induction: Intravenous  Airway Management Planned: Oral ETT  Additional Equipment:   Intra-op Plan:   Post-operative Plan: Extubation in OR  Informed Consent: I have reviewed the patients History and Physical, chart, labs and discussed the procedure including the risks, benefits and alternatives for the proposed anesthesia with the patient or authorized representative who has indicated his/her understanding and acceptance.   Dental advisory given  Plan Discussed with: CRNA  Anesthesia Plan Comments:         Anesthesia Quick Evaluation

## 2013-08-31 NOTE — Interval H&P Note (Signed)
History and Physical Interval Note:  08/31/2013 7:10 AM  [name]  has presented today for surgery, with the diagnosis of LEFT RENAL NEOPLASM  The various methods of treatment have been discussed with the patient and family. After consideration of risks, benefits and other options for treatment, the patient has consented to  Procedure(s): ROBOTIC ASSITED PARTIAL NEPHRECTOMY (Left) as a surgical intervention .  The patient's history has been reviewed, patient examined, no change in status, stable for surgery.  I have reviewed the patient's chart and labs.  Questions were answered to the patient's satisfaction.     Zane Samson,LES

## 2013-08-31 NOTE — Addendum Note (Signed)
Addendum created 08/31/13 1406 by Isa Rankin, CRNA   Modules edited: Anesthesia Flowsheet

## 2013-09-01 ENCOUNTER — Encounter (HOSPITAL_COMMUNITY): Payer: Self-pay | Admitting: Urology

## 2013-09-01 LAB — GLUCOSE, CAPILLARY
Glucose-Capillary: 241 mg/dL — ABNORMAL HIGH (ref 70–99)
Glucose-Capillary: 208 mg/dL — ABNORMAL HIGH (ref 70–99)
Glucose-Capillary: 163 mg/dL — ABNORMAL HIGH (ref 70–99)

## 2013-09-01 LAB — BASIC METABOLIC PANEL
BUN: 18 mg/dL (ref 6–23)
Calcium: 8.5 mg/dL (ref 8.4–10.5)
GFR calc Af Amer: 32 mL/min — ABNORMAL LOW (ref >90)
GFR calc non Af Amer: 28 mL/min — ABNORMAL LOW (ref >90)
Glucose, Bld: 237 mg/dL — ABNORMAL HIGH (ref 70–99)
Potassium: 4 mEq/L (ref 3.5–5.1)
Sodium: 133 mEq/L — ABNORMAL LOW (ref 135–145)

## 2013-09-01 MED ORDER — SODIUM CHLORIDE 0.9 % IV BOLUS (SEPSIS)
500.0000 mL | Freq: Once | INTRAVENOUS | Status: AC
  Administered 2013-09-01: 500 mL via INTRAVENOUS

## 2013-09-01 MED ORDER — BISACODYL 10 MG RE SUPP
10.0000 mg | Freq: Once | RECTAL | Status: AC
  Administered 2013-09-01: 10 mg via RECTAL
  Filled 2013-09-01: qty 1

## 2013-09-01 MED ORDER — HYDROCODONE-ACETAMINOPHEN 5-325 MG PO TABS
1.0000 | ORAL_TABLET | Freq: Four times a day (QID) | ORAL | Status: DC | PRN
  Administered 2013-09-01 – 2013-09-02 (×4): 2 via ORAL
  Administered 2013-09-02: 1 via ORAL
  Administered 2013-09-02: 2 via ORAL
  Administered 2013-09-03: 1 via ORAL
  Filled 2013-09-01: qty 2
  Filled 2013-09-01 (×2): qty 1
  Filled 2013-09-01 (×4): qty 2

## 2013-09-01 NOTE — Progress Notes (Signed)
Patient ID: [name], male   DOB: 10-27-1934, 79 y.o.   MRN: [id number]   Pt has been mildly tachycardic today.  HR 109 now.  UOP has been on the low side as well. He has not passed flatus but has tolerated regular food.  Pain is controlled. No CP or SOB or palpitations.  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

## 2013-09-01 NOTE — Progress Notes (Signed)
Patient ID: SHELLIE BERENGUER, male   DOB: 1934/09/25, 79 y.o.   MRN: [id number]  1 Day Post-Op Subjective: The patient is doing well.  Nausea resolved. Pain is adequately controlled.  Objective: Vital signs in last 24 hours: Temp:  [97 F (36.1 C)-99.7 F (37.6 C)] 98 F (36.7 C) (09/12 0434) Pulse Rate:  [88-107] 89 (09/12 0434) Resp:  [10-18] 18 (09/12 0434) BP: (99-168)/(57-82) 99/57 mmHg (09/12 0434) SpO2:  [94 %-100 %] 99 % (09/12 0434) Weight:  [83.6 kg (184 lb 4.9 oz)] 83.6 kg (184 lb 4.9 oz) (09/11 1245)  Intake/Output from previous day: 09/11 0701 - 09/12 0700 In: 2507.5 [P.O.:100; I.V.:2307.5; IV Piggyback:100] Out: 2025 [Urine:1550; Drains:325; Blood:150] Intake/Output this shift: Total I/O In: -  Out: 810 [Urine:750; Drains:60]  Physical Exam:  General: Alert and oriented. CV: RRR Lungs: Clear bilaterally. GI: Soft, Nondistended. Incisions: Clean and dry. Drain in place with serosanguinous fluid. Urine: Clear Extremities: Nontender, no erythema, no edema.  Lab Results:  Recent Labs  08/31/13 1204 09/01/13 0358  HGB 12.4* 11.1*  HCT 36.8* 33.4*          Recent Labs  08/31/13 1204 09/01/13 0358  CREATININE 1.56* 2.17*           Results for orders placed during the hospital encounter of 08/31/13 (from the past 24 hour(s))  GLUCOSE, CAPILLARY     Status: Abnormal   Collection Time    08/31/13 11:49 AM      Result Value Range   Glucose-Capillary 152 (*) 70 - 99 mg/dL  BASIC METABOLIC PANEL     Status: Abnormal   Collection Time    08/31/13 12:04 PM      Result Value Range   Sodium 137  135 - 145 mEq/L   Potassium 4.3  3.5 - 5.1 mEq/L   Chloride 103  96 - 112 mEq/L   CO2 27  19 - 32 mEq/L   Glucose, Bld 172 (*) 70 - 99 mg/dL   BUN 17  6 - 23 mg/dL   Creatinine, Ser 1.56 (*) 0.50 - 1.35 mg/dL   Calcium 9.0  8.4 - 10.5 mg/dL   GFR calc non Af Amer 41 (*) >90 mL/min   GFR calc Af Amer 48 (*) >90 mL/min  HEMOGLOBIN AND HEMATOCRIT, BLOOD      Status: Abnormal   Collection Time    08/31/13 12:04 PM      Result Value Range   Hemoglobin 12.4 (*) 13.0 - 17.0 g/dL   HCT 36.8 (*) 39.0 - 52.0 %  GLUCOSE, CAPILLARY     Status: Abnormal   Collection Time    08/31/13  4:34 PM      Result Value Range   Glucose-Capillary 302 (*) 70 - 99 mg/dL  GLUCOSE, CAPILLARY     Status: Abnormal   Collection Time    08/31/13  8:26 PM      Result Value Range   Glucose-Capillary 326 (*) 70 - 99 mg/dL  GLUCOSE, CAPILLARY     Status: Abnormal   Collection Time    09/01/13 12:09 AM      Result Value Range   Glucose-Capillary 267 (*) 70 - 99 mg/dL  BASIC METABOLIC PANEL     Status: Abnormal   Collection Time    09/01/13  3:58 AM      Result Value Range   Sodium 133 (*) 135 - 145 mEq/L   Potassium 4.0  3.5 - 5.1 mEq/L   Chloride 98  96 - 112 mEq/L   CO2 29  19 - 32 mEq/L   Glucose, Bld 237 (*) 70 - 99 mg/dL   BUN 18  6 - 23 mg/dL   Creatinine, Ser 2.17 (*) 0.50 - 1.35 mg/dL   Calcium 8.5  8.4 - 10.5 mg/dL   GFR calc non Af Amer 28 (*) >90 mL/min   GFR calc Af Amer 32 (*) >90 mL/min  HEMOGLOBIN AND HEMATOCRIT, BLOOD     Status: Abnormal   Collection Time    09/01/13  3:58 AM      Result Value Range   Hemoglobin 11.1 (*) 13.0 - 17.0 g/dL   HCT 33.4 (*) 39.0 - 52.0 %  GLUCOSE, CAPILLARY     Status: Abnormal   Collection Time    09/01/13  4:23 AM      Result Value Range   Glucose-Capillary 224 (*) 70 - 99 mg/dL    Assessment/Plan: POD# 1 s/p robotic partial nephrectomy.  1) Ambulate, Incentive spirometry 2) Advance diet as tolerated 3) Transition to oral pain medication 4) Dulcolax suppository 5) D/C urethral catheter 6) Monitor renal function   Pryor Curia. MD   LOS: 1 day   Jasiel Belisle,LES 09/01/2013, 6:50 AM

## 2013-09-02 ENCOUNTER — Inpatient Hospital Stay (HOSPITAL_COMMUNITY): Payer: Medicare Other

## 2013-09-02 LAB — GLUCOSE, CAPILLARY
Glucose-Capillary: 184 mg/dL — ABNORMAL HIGH (ref 70–99)
Glucose-Capillary: 211 mg/dL — ABNORMAL HIGH (ref 70–99)
Glucose-Capillary: 198 mg/dL — ABNORMAL HIGH (ref 70–99)
Glucose-Capillary: 180 mg/dL — ABNORMAL HIGH (ref 70–99)
Glucose-Capillary: 227 mg/dL — ABNORMAL HIGH (ref 70–99)

## 2013-09-02 LAB — CREATININE, FLUID (PLEURAL, PERITONEAL, JP DRAINAGE): Creat, Fluid: 2.2 mg/dL

## 2013-09-02 LAB — BASIC METABOLIC PANEL
CO2: 24 mEq/L (ref 19–32)
Chloride: 98 mEq/L (ref 96–112)
Glucose, Bld: 199 mg/dL — ABNORMAL HIGH (ref 70–99)
Sodium: 129 mEq/L — ABNORMAL LOW (ref 135–145)

## 2013-09-02 LAB — HEMOGLOBIN AND HEMATOCRIT, BLOOD
HCT: 31.1 % — ABNORMAL LOW (ref 39.0–52.0)
Hemoglobin: 10.7 g/dL — ABNORMAL LOW (ref 13.0–17.0)

## 2013-09-02 MED ORDER — ALLOPURINOL 100 MG PO TABS
100.0000 mg | ORAL_TABLET | Freq: Every morning | ORAL | Status: DC
  Administered 2013-09-03: 100 mg via ORAL
  Filled 2013-09-02: qty 1

## 2013-09-02 MED ORDER — INSULIN ASPART 100 UNIT/ML ~~LOC~~ SOLN
0.0000 [IU] | Freq: Three times a day (TID) | SUBCUTANEOUS | Status: DC
  Administered 2013-09-02: 3 [IU] via SUBCUTANEOUS
  Administered 2013-09-02: 5 [IU] via SUBCUTANEOUS
  Administered 2013-09-03: 3 [IU] via SUBCUTANEOUS

## 2013-09-02 NOTE — Progress Notes (Signed)
Reviewed renal US - no obvious hydro, hematoma, urinoma. Check renal fxn in AM.

## 2013-09-02 NOTE — Progress Notes (Signed)
Patient ID: [name], male   DOB: 19-Jun-1934, 79 y.o.   MRN: [id number]  Pt without complaints. UOP has picked up. 850 overnight and another 322ml in urinal - clear. Has had a BM. Wife said he is somewhat confused but he answered my questions appropriately.   Filed Vitals:   09/02/13 0519  BP: 111/66  Pulse: 108  Temp: 99.8 F (37.7 C)  Resp: 14      NAD A&Ox3 for me Watching TV eating lunch Abd - soft, NT, ND, good BS.  Ext - no CCE  CBC    Component Value Date/Time   WBC 10.2 08/22/2013 1015   WBC 6.7 11/21/2008 1512   RBC 3.94* 08/22/2013 1015   RBC 4.15* 11/21/2008 1512   HGB 10.7* 09/02/2013 0454   HGB 13.7 11/21/2008 1512   HCT 31.1* 09/02/2013 0454   HCT 40.4 11/21/2008 1512   PLT 210 08/22/2013 1015   PLT 233 11/21/2008 1512   MCV 99.5 08/22/2013 1015   MCV 97.4 11/21/2008 1512   MCH 33.8 08/22/2013 1015   MCH 33.1 11/21/2008 1512   MCHC 33.9 08/22/2013 1015   MCHC 34.0 11/21/2008 1512   RDW 15.2 08/22/2013 1015   RDW 14.3 11/21/2008 1512   LYMPHSABS 1.2 11/21/2008 1512   MONOABS 0.4 11/21/2008 1512   EOSABS 0.0 11/21/2008 1512   BASOSABS 0.0 11/21/2008 1512   BMET    Component Value Date/Time   NA 129* 09/02/2013 0454   K 4.5 09/02/2013 0454   CL 98 09/02/2013 0454   CO2 24 09/02/2013 0454   GLUCOSE 199* 09/02/2013 0454   BUN 18 09/02/2013 0454   CREATININE 2.40* 09/02/2013 0454   CALCIUM 8.3* 09/02/2013 0454   GFRNONAA 24* 09/02/2013 0454   GFRAA 28* 09/02/2013 0454    JP CR 2.2 -- serum - no evidence of urine leak   Imp / plan:  POD # left partial Nx - mild temp, tachycardia, reported confusion. Looks OK and in NAD.  -ARF - good UOP. Check renal bladder US for hematoma, urinoma, hydronephrosis, etc. SLIV. No on any BP meds, NSAID, etc.  -Anemia - H/H trending down but still safe level. U/s as above.

## 2013-09-03 LAB — GLUCOSE, CAPILLARY
Glucose-Capillary: 160 mg/dL — ABNORMAL HIGH (ref 70–99)
Glucose-Capillary: 183 mg/dL — ABNORMAL HIGH (ref 70–99)

## 2013-09-03 LAB — CBC
Hemoglobin: 10.8 g/dL — ABNORMAL LOW (ref 13.0–17.0)
MCH: 34 pg (ref 26.0–34.0)
MCHC: 34 g/dL (ref 30.0–36.0)
Platelets: 148 10*3/uL — ABNORMAL LOW (ref 150–400)
RDW: 15.5 % (ref 11.5–15.5)

## 2013-09-03 LAB — BASIC METABOLIC PANEL
Calcium: 8.9 mg/dL (ref 8.4–10.5)
GFR calc Af Amer: 27 mL/min — ABNORMAL LOW (ref >90)
GFR calc non Af Amer: 23 mL/min — ABNORMAL LOW (ref >90)
Glucose, Bld: 173 mg/dL — ABNORMAL HIGH (ref 70–99)
Sodium: 133 mEq/L — ABNORMAL LOW (ref 135–145)

## 2013-09-03 MED ORDER — ACETAMINOPHEN 325 MG PO TABS: 325.0000 mg | ORAL_TABLET | Freq: Four times a day (QID) | ORAL | Status: DC | PRN

## 2013-09-03 MED ORDER — ALLOPURINOL 100 MG PO TABS: 100.0000 mg | ORAL_TABLET | Freq: Every morning | ORAL | Status: DC

## 2013-09-03 NOTE — Discharge Summary (Signed)
Physician Discharge Summary  Patient ID: [name] MRN: [id number] DOB/AGE: 22-Feb-1934 79 y.o.  Admit date: 08/31/2013 Discharge date: 09/03/2013  Admission Diagnoses: Left renal neoplasm  Discharge Diagnoses:  Left renal neoplasm  Discharged Condition: good  Hospital Course: Admitted s/p Left robotic-assisted laparoscopic partial nephrectomy. By POD#2 he was ambulating and tolerating a regular diet. His CR rose from 1.17 pre-op tp 2.4 POD#2 and a renal US was normal -- likely related to some pre-op mild CRI and warm intra-op ischemia. Cr leveling on POD#3 at 2.49. JP serous and d/c/d - only scant output. He had some mild confusion after narcotics but a normal neuro exam. I recommended to patient and wife to have serum Cr checked this week.   Consults: None  Significant Diagnostic Studies: renal u/s - no hydro or fluid collections  Treatments: surgery: Left robotic-assisted laparoscopic partial nephrectomy   Discharge Exam: Blood pressure 112/68, pulse 91, temperature 97.9 F (36.6 C), temperature source Oral, resp. rate 14, height 5' 7.5" (1.715 m), weight 83.6 kg (184 lb 4.9 oz), SpO2 97.00%. See Progress note for exam  Disposition:  To home    Medication List    STOP taking these medications       aspirin EC 81 MG tablet     calcium carbonate 600 MG Tabs tablet  Commonly known as:  OS-CAL     cholecalciferol 1000 UNITS tablet  Commonly known as:  VITAMIN D     indomethacin 50 MG capsule  Commonly known as:  INDOCIN     multivitamin tablet      TAKE these medications       acetaminophen 325 MG tablet  Commonly known as:  TYLENOL  Take 1 tablet (325 mg total) by mouth every 6 (six) hours as needed for pain (1-2 tablets q 6 hrs prn pain).     alendronate 70 MG tablet  Commonly known as:  FOSAMAX  Take 70 mg by mouth daily. Take with a full glass of water on an empty stomach. Patient takes this medication once weekly on Saturday.     alfuzosin 10 MG 24 hr  tablet  Commonly known as:  UROXATRAL  Take 10 mg by mouth daily.     allopurinol 100 MG tablet  Commonly known as:  ZYLOPRIM  Take 1 tablet (100 mg total) by mouth every morning.     COLCRYS 0.6 MG tablet  Generic drug:  colchicine  Take 0.6 mg by mouth daily.     glipiZIDE 10 MG tablet  Commonly known as:  GLUCOTROL  Take 10 mg by mouth 2 (two) times daily before a meal.     omeprazole 20 MG capsule  Commonly known as:  PRILOSEC  Take 20 mg by mouth 2 (two) times daily.     predniSONE 10 MG tablet  Commonly known as:  DELTASONE  Take 10 mg by mouth daily.     rosuvastatin 20 MG tablet  Commonly known as:  CRESTOR  Take 20 mg by mouth every morning.     solifenacin 5 MG tablet  Commonly known as:  VESICARE  Take 5 mg by mouth every morning.     zolpidem 5 MG tablet  Commonly known as:  AMBIEN  Take 5 mg by mouth at bedtime as needed for sleep.           Follow-up Information   Follow up with BORDEN,LES, MD On 09/26/2013. (at 10:00)    Specialty:  Urology   Contact information:  [address], [place] [address] [phone]       Signed: Fredricka Bonine 09/03/2013, 5:09 PM

## 2013-09-03 NOTE — Progress Notes (Addendum)
Patient ID: MOISE MALZAHN, male   DOB: May 25, 1934, 79 y.o.   MRN: [id number]  Cr up slightly and still having some confusion at times. HR in 100's.   Filed Vitals:   09/03/13 0534  BP: 124/69  Pulse: 102  Temp: 98.5 F (36.9 C)  Resp: 14   NAD A&Ox3 Neuro - equal ext strength, CN's intact CV - RRR Abd - soft, NT , good BS Ext - no CCE  Imp/Plan: POD#3 Left robotic-assisted laparoscopic partial nephrectomy -Cr up slightly, some confusion. I'll have nephrology see and possibly d/c pt home later today or in AM.  -D/C JP   ADD: I have not been able to contact nephrology today. Discussed patient with nurse and pt has had a good day without confusion. Drain is out and he is ambulating. Will d/c home.

## 2013-11-01 ENCOUNTER — Encounter: Payer: Self-pay | Admitting: Interventional Cardiology

## 2013-12-05 ENCOUNTER — Other Ambulatory Visit: Payer: Self-pay | Admitting: Dermatology

## 2014-01-23 ENCOUNTER — Other Ambulatory Visit: Payer: Self-pay | Admitting: Dermatology

## 2014-02-03 ENCOUNTER — Encounter: Payer: Self-pay | Admitting: Interventional Cardiology

## 2014-02-03 DIAGNOSIS — M353 Polymyalgia rheumatica: Secondary | ICD-10-CM | POA: Insufficient documentation

## 2014-02-03 DIAGNOSIS — E1169 Type 2 diabetes mellitus with other specified complication: Secondary | ICD-10-CM | POA: Insufficient documentation

## 2014-02-03 DIAGNOSIS — M545 Low back pain, unspecified: Secondary | ICD-10-CM | POA: Insufficient documentation

## 2014-02-03 DIAGNOSIS — M1990 Unspecified osteoarthritis, unspecified site: Secondary | ICD-10-CM | POA: Insufficient documentation

## 2014-02-03 DIAGNOSIS — C801 Malignant (primary) neoplasm, unspecified: Secondary | ICD-10-CM | POA: Insufficient documentation

## 2014-02-03 DIAGNOSIS — I25810 Atherosclerosis of coronary artery bypass graft(s) without angina pectoris: Secondary | ICD-10-CM | POA: Insufficient documentation

## 2014-02-03 DIAGNOSIS — Z86718 Personal history of other venous thrombosis and embolism: Secondary | ICD-10-CM | POA: Insufficient documentation

## 2014-02-03 DIAGNOSIS — K219 Gastro-esophageal reflux disease without esophagitis: Secondary | ICD-10-CM | POA: Insufficient documentation

## 2014-02-03 DIAGNOSIS — E119 Type 2 diabetes mellitus without complications: Secondary | ICD-10-CM | POA: Insufficient documentation

## 2014-02-03 DIAGNOSIS — I219 Acute myocardial infarction, unspecified: Secondary | ICD-10-CM | POA: Insufficient documentation

## 2014-02-03 DIAGNOSIS — E785 Hyperlipidemia, unspecified: Secondary | ICD-10-CM | POA: Insufficient documentation

## 2014-02-05 ENCOUNTER — Ambulatory Visit (INDEPENDENT_AMBULATORY_CARE_PROVIDER_SITE_OTHER): Payer: Medicare Other | Admitting: Interventional Cardiology

## 2014-02-05 ENCOUNTER — Encounter: Payer: Self-pay | Admitting: Interventional Cardiology

## 2014-02-05 VITALS — BP 122/64 | HR 61 | Ht 67.5 in | Wt 187.0 lb

## 2014-02-05 DIAGNOSIS — E119 Type 2 diabetes mellitus without complications: Secondary | ICD-10-CM

## 2014-02-05 DIAGNOSIS — I219 Acute myocardial infarction, unspecified: Secondary | ICD-10-CM

## 2014-02-05 DIAGNOSIS — I25810 Atherosclerosis of coronary artery bypass graft(s) without angina pectoris: Secondary | ICD-10-CM

## 2014-02-05 DIAGNOSIS — E785 Hyperlipidemia, unspecified: Secondary | ICD-10-CM

## 2014-02-05 NOTE — Patient Instructions (Signed)
Your physician recommends that you continue on your current medications as directed. Please refer to the Current Medication list given to you today.  Your physician wants you to follow-up in: 1 year. You will receive a reminder letter in the mail two months in advance. If you don't receive a letter, please call our office to schedule the follow-up appointment.  

## 2014-02-05 NOTE — Progress Notes (Signed)
Patient ID: [name], male   DOB: 02/28/1934, 80 y.o.   MRN: [id number]    [address], [place]  [address] Phone: [phone] Fax:  [phone]  Date:  02/05/2014   ID:  [name], DOB 24-Jun-1934, MRN [id number]  PCP:  Steven Argyle, MD   ASSESSMENT:  1. Coronary artery disease with no recent angina 2. Mild aortic stenosis 3. Hyperlipidemia  PLAN:  1. Continue current regimen including aerobic exercise to prevent regaining weight. 2. Return to see me in one year   SUBJECTIVE: [name] is a 80 y.o. male who denies chest discomfort or any cardiac complaints. He has different aches and pains. He denies palpitations and syncope.   Wt Readings from Last 3 Encounters:  02/05/14 187 lb (84.823 kg)  08/31/13 184 lb 4.9 oz (83.6 kg)  08/31/13 184 lb 4.9 oz (83.6 kg)     Past Medical History  Diagnosis Date  . Hyperlipidemia   . Gout   . Glaucoma     "high normal"  . Diabetes mellitus without complication   . Myocardial infarction     "told me I had a small heart attack"  . GERD (gastroesophageal reflux disease)   . Cancer 2010    prostate  . Arthritis   . H/O blood clots     R Lower leg, right upper leg  . Coronary artery disease   . Low back pain     "severe"  . Polymyalgia     h/o    Current Outpatient Prescriptions  Medication Sig Dispense Refill  . acetaminophen (TYLENOL) 325 MG tablet Take 1 tablet (325 mg total) by mouth every 6 (six) hours as needed for pain (1-2 tablets q 6 hrs prn pain).  30 tablet  0  . alendronate (FOSAMAX) 70 MG tablet Take 70 mg by mouth daily. Take with a full glass of water on an empty stomach. Patient takes this medication once weekly on Saturday.      Marland Kitchen alfuzosin (UROXATRAL) 10 MG 24 hr tablet Take 10 mg by mouth daily.      Marland Kitchen allopurinol (ZYLOPRIM) 100 MG tablet Take 1 tablet (100 mg total) by mouth every morning.  30 tablet  0  . colchicine (COLCRYS) 0.6 MG tablet Take 0.6 mg by mouth  daily.      Marland Kitchen glipiZIDE (GLUCOTROL) 10 MG tablet Take 10 mg by mouth 2 (two) times daily before a meal.      . omeprazole (PRILOSEC) 20 MG capsule Take 20 mg by mouth 2 (two) times daily.      . predniSONE (DELTASONE) 10 MG tablet Take 10 mg by mouth daily. TAKES 30 MG FOR THE WEEK OF 02/05/14 WEEK OF 2/23  TAKE  20 MG      . rosuvastatin (CRESTOR) 20 MG tablet Take 20 mg by mouth every morning.       . solifenacin (VESICARE) 5 MG tablet Take 5 mg by mouth every morning.       . zolpidem (AMBIEN) 5 MG tablet Take 5 mg by mouth at bedtime as needed for sleep.       No current facility-administered medications for this visit.    Allergies:    Allergies  Allergen Reactions  . Lipitor [Atorvastatin] Other (See Comments)    Muscle pain  . Penicillins     UNKNOWN    Social History:  The patient  reports that he quit smoking about 33 years ago.  His smoking use included Cigarettes. He has a 90 pack-year smoking history. He has never used smokeless tobacco. He reports that he does not drink alcohol or use illicit drugs.   ROS:  Please see the history of present illness.      All other systems reviewed and negative.   OBJECTIVE: VS:  BP 122/64  Pulse 61  Ht 5' 7.5" (1.715 m)  Wt 187 lb (84.823 kg)  BMI 28.84 kg/m2 Well nourished, well developed, in no acute distress, has lost weight and appears healthy at HEENT: normal Neck: JVD flat. Carotid bruit absent  Cardiac:  normal S1, S2; RRR; no murmur Lungs:  clear to auscultation bilaterally, no wheezing, rhonchi or rales Abd: soft, nontender, no hepatomegaly Ext: Edema without edema. Pulses 2+ and symmetric Skin: warm and dry Neuro:  CNs 2-12 intact, no focal abnormalities noted  EKG:  Normal sinus rhythm with nonspecific ST abnormality and leftward axis      Past Medical History  Prostate Cancer diagnosed 10/2008-radiation Dr Diona Fanti   Osteoporosis BMD--3/12 on fosamax-osteopenia 07/2013   Polymyalgia rheumatica   Type 2 Diabetes  Mellitus   Hyperlipidemia   Obesity   Gout vs pseudogout--chondrocalcinosis on xray of knees   CAD   Azotemia   Status post right greater sepsis vein thrombophlebitis   Osteoarthritis right knee   Tinnitus   Glaucoma   Shingles left abdomen   Heel spur   Chondromalacia patella left knee   Xiphoid soft tissue nodule   GERD   Allergic rhinitis   Mild aortic stenosis/mild mitral regurg echocardiogram 01/2010   stage III renal disease [name]   disc disease L3-L4 on plain film 01/2011   tubular adenoma Colonoscopy 05/2011-Dr. Wynetta Emery   ortho Dr Berenice Primas   rheumatology Dr    severe lumbar spinal stenosis especially on the left on 10/2012 Dr Annette Stable, Brien Few and benefit epidural   left renal cyst versus mass MRI of spine 10/2012 will set up designated MRI kidney-suspicious lesion left kidney evaluation-Dr. Diona Fanti and [facility]   right anterior tibialis tendon rupture - Dr Dorothy Spark Dr    neurosurgery Dr Annette Stable    Signed, Illene Labrador III, MD 02/05/2014 1:11 PM

## 2014-02-07 ENCOUNTER — Ambulatory Visit: Payer: Medicare Other | Admitting: Interventional Cardiology

## 2014-04-10 ENCOUNTER — Ambulatory Visit (HOSPITAL_COMMUNITY)
Admission: RE | Admit: 2014-04-10 | Discharge: 2014-04-10 | Disposition: A | Discharge status: Home / Self Care | Payer: Medicare Other | Source: Ambulatory Visit | Attending: Urology | Admitting: Urology

## 2014-04-10 ENCOUNTER — Other Ambulatory Visit: Payer: Self-pay | Admitting: Urology

## 2014-04-10 DIAGNOSIS — Z85528 Personal history of other malignant neoplasm of kidney: Secondary | ICD-10-CM | POA: Insufficient documentation

## 2014-04-10 DIAGNOSIS — C649 Malignant neoplasm of unspecified kidney, except renal pelvis: Secondary | ICD-10-CM

## 2014-12-21 DIAGNOSIS — Z8619 Personal history of other infectious and parasitic diseases: Secondary | ICD-10-CM

## 2014-12-21 DIAGNOSIS — B999 Unspecified infectious disease: Secondary | ICD-10-CM

## 2014-12-21 HISTORY — DX: Unspecified infectious disease: B99.9

## 2014-12-21 HISTORY — DX: Personal history of other infectious and parasitic diseases: Z86.19

## 2015-02-11 ENCOUNTER — Encounter: Payer: Self-pay | Admitting: Interventional Cardiology

## 2015-02-11 ENCOUNTER — Ambulatory Visit (INDEPENDENT_AMBULATORY_CARE_PROVIDER_SITE_OTHER): Payer: Medicare Other | Admitting: Interventional Cardiology

## 2015-02-11 VITALS — BP 144/88 | HR 87 | Ht 67.5 in | Wt 196.0 lb

## 2015-02-11 DIAGNOSIS — I200 Unstable angina: Secondary | ICD-10-CM

## 2015-02-11 DIAGNOSIS — I25810 Atherosclerosis of coronary artery bypass graft(s) without angina pectoris: Secondary | ICD-10-CM

## 2015-02-11 DIAGNOSIS — E785 Hyperlipidemia, unspecified: Secondary | ICD-10-CM

## 2015-02-11 DIAGNOSIS — I25700 Atherosclerosis of coronary artery bypass graft(s), unspecified, with unstable angina pectoris: Secondary | ICD-10-CM

## 2015-02-11 DIAGNOSIS — I10 Essential (primary) hypertension: Secondary | ICD-10-CM | POA: Insufficient documentation

## 2015-02-11 NOTE — Progress Notes (Signed)
Cardiology Office Note   Date:  02/11/2015   ID:  [name], DOB [birth year], MRN [id number]  PCP:  Mathews Argyle, MD  Cardiologist:   Sinclair Grooms, MD   No chief complaint on file.     History of Present Illness: [name] is a 81 y.o. male who presents for CAD and hypertension. He has no chest pain or dyspnea. He is having significant difficulty with his back. He has had multiple injections without significant improvement. He is unable to walk. He is gaining weight. He denies orthopnea. No angina or need for sublingual nitroglycerin.    Past Medical History  Diagnosis Date  . Hyperlipidemia   . Gout   . Glaucoma     "high normal"  . Diabetes mellitus without complication   . Myocardial infarction     "told me I had a small heart attack"  . GERD (gastroesophageal reflux disease)   . Cancer 2010    prostate  . Arthritis   . H/O blood clots     R Lower leg, right upper leg  . Coronary artery disease   . Low back pain     "severe"  . Polymyalgia     h/o  . Osteoporosis   . Osteopenia   . Obesity   . Azotemia   . Thrombophlebitis   . Tinnitus   . Joint pain   . Heel spur   . Spinal stenosis   . Renal cyst, left   . Shingles     LEFT ABDOMEN  . Tubular adenoma   . Lesion of left native kidney   . Mild aortic stenosis   . Chondromalacia     Past Surgical History  Procedure Laterality Date  . Prostate surgery      seed implant  . Coronary artery bypass graft  2007  . Vein ligation and stripping Right 1972    right lower leg  . Appendectomy  60 years ago  . Cardiac catheterization    . Robotic assited partial nephrectomy Left 08/31/2013    Procedure: ROBOTIC ASSITED PARTIAL NEPHRECTOMY;  Surgeon: Dutch Gray, MD;  Location: [facility];  Service: Urology;  Laterality: Left;     Current Outpatient Prescriptions  Medication Sig Dispense Refill  . acetaminophen (TYLENOL) 325 MG tablet Take 1 tablet (325 mg total) by mouth every 6 (six) hours  as needed for pain (1-2 tablets q 6 hrs prn pain). 30 tablet 0  . alendronate (FOSAMAX) 70 MG tablet Take 70 mg by mouth daily. Take with a full glass of water on an empty stomach. Patient takes this medication once weekly on Saturday.    Marland Kitchen alfuzosin (UROXATRAL) 10 MG 24 hr tablet Take 10 mg by mouth daily.    Marland Kitchen allopurinol (ZYLOPRIM) 300 MG tablet Take 300 mg by mouth daily.  2  . colchicine (COLCRYS) 0.6 MG tablet Take 0.6 mg by mouth daily.    . CRESTOR 10 MG tablet Take 10 mg by mouth daily.  8  . glipiZIDE (GLUCOTROL) 10 MG tablet Take 10 mg by mouth 2 (two) times daily before a meal.    . omeprazole (PRILOSEC) 20 MG capsule Take 20 mg by mouth 2 (two) times daily.    . predniSONE (DELTASONE) 20 MG tablet Take 20 mg by mouth 2 (two) times daily.  0  . tamsulosin (FLOMAX) 0.4 MG CAPS capsule Take 0.4 mg by mouth daily.  0  . zolpidem (AMBIEN) 5 MG tablet Take 5  mg by mouth at bedtime as needed for sleep.     No current facility-administered medications for this visit.    Allergies:   Adacel; Lipitor; Penicillins; Septra; and Simvastatin    Social History:  The patient  reports that he quit smoking about 34 years ago. His smoking use included Cigarettes. He has a 90 pack-year smoking history. He has never used smokeless tobacco. He reports that he does not drink alcohol or use illicit drugs.   Family History:  The patient's family history includes Heart Problems in his father and mother.    ROS:  Please see the history of present illness.   Otherwise, review of systems are positive for back discomfort and leg pain..   All other systems are reviewed and negative.    PHYSICAL EXAM: VS:  BP 144/88 mmHg  Pulse 87  Ht 5' 7.5" (1.715 m)  Wt 196 lb (88.905 kg)  BMI 30.23 kg/m2 , BMI Body mass index is 30.23 kg/(m^2). GEN: Well nourished, well developed, in no acute distress HEENT: normal Neck: no JVD, carotid bruits, or masses Cardiac: RRR; no murmurs, rubs, or gallops,no edema    Respiratory:  clear to auscultation bilaterally, normal work of breathing GI: soft, nontender, nondistended, + BS MS: no deformity or atrophy Skin: warm and dry, no rash Neuro:  Strength and sensation are intact Psych: euthymic mood, full affect   EKG:  EKG is ordered today. The ekg ordered today demonstrates normal sinus rhythm at 87 bpm, left axis deviation, left ventricular hypertrophy. Nonspecific T-wave flattening is noted.   Recent Labs: No results found for requested labs within last 365 days.    Lipid Panel No results found for: CHOL, TRIG, HDL, CHOLHDL, VLDL, LDLCALC, LDLDIRECT    Wt Readings from Last 3 Encounters:  02/11/15 196 lb (88.905 kg)  02/05/14 187 lb (84.823 kg)  08/31/13 184 lb 4.9 oz (83.6 kg)      Other studies Reviewed: Additional studies/ records that were reviewed today include: .    ASSESSMENT AND PLAN:  1. CAD without angina or dyspnea. Stable s/p CABG 2013 2. Hypertension with poor control related to steroids 3. Hyperlipidemia 4. Preoperative cardiovascular clearance 4. Lumbar disc disease, limiting  Current medicines are reviewed at length with the patient today.  The patient does not have concerns regarding medicines.  The following changes have been made:  None. Should he require lumbar surgery, he will be cleared and felt to be at low risk for cardiovascular complications.  Labs/ tests ordered today include:  No orders of the defined types were placed in this encounter.     Disposition:   FU with Daneen Schick in 12 months   Signed, Sinclair Grooms, MD  02/11/2015 10:20 AM    [address] [place], [place], [place]  [address] Phone: [phone]; Fax: [phone]

## 2015-02-11 NOTE — Patient Instructions (Signed)
Your physician recommends that you continue on your current medications as directed. Please refer to the Current Medication list given to you today.  Your physician wants you to follow-up in: 1 year with Dr.Smith You will receive a reminder letter in the mail two months in advance. If you don't receive a letter, please call our office to schedule the follow-up appointment.  

## 2015-06-10 ENCOUNTER — Ambulatory Visit (HOSPITAL_COMMUNITY)
Admission: RE | Admit: 2015-06-10 | Discharge: 2015-06-10 | Disposition: A | Discharge status: Home / Self Care | Payer: Medicare Other | Source: Ambulatory Visit | Attending: Urology | Admitting: Urology

## 2015-06-10 ENCOUNTER — Other Ambulatory Visit: Payer: Self-pay | Admitting: Urology

## 2015-06-10 DIAGNOSIS — C649 Malignant neoplasm of unspecified kidney, except renal pelvis: Secondary | ICD-10-CM

## 2015-08-06 ENCOUNTER — Other Ambulatory Visit: Payer: Self-pay | Admitting: Geriatric Medicine

## 2015-08-06 DIAGNOSIS — M545 Low back pain: Secondary | ICD-10-CM

## 2015-08-10 ENCOUNTER — Ambulatory Visit
Admission: RE | Admit: 2015-08-10 | Discharge: 2015-08-10 | Disposition: A | Discharge status: Home / Self Care | Payer: Medicare Other | Source: Ambulatory Visit | Attending: Geriatric Medicine | Admitting: Geriatric Medicine

## 2015-08-10 DIAGNOSIS — M545 Low back pain: Secondary | ICD-10-CM

## 2015-08-19 ENCOUNTER — Other Ambulatory Visit: Payer: Self-pay | Admitting: Geriatric Medicine

## 2015-08-19 DIAGNOSIS — R109 Unspecified abdominal pain: Secondary | ICD-10-CM

## 2015-08-21 ENCOUNTER — Ambulatory Visit
Admission: RE | Admit: 2015-08-21 | Discharge: 2015-08-21 | Disposition: A | Discharge status: Home / Self Care | Payer: Medicare Other | Source: Ambulatory Visit | Attending: Geriatric Medicine | Admitting: Geriatric Medicine

## 2015-08-21 ENCOUNTER — Inpatient Hospital Stay: Admission: RE | Admit: 2015-08-21 | Payer: Medicare Other | Source: Ambulatory Visit

## 2015-08-21 MED ORDER — IOPAMIDOL (ISOVUE-300) INJECTION 61%
100.0000 mL | Freq: Once | INTRAVENOUS | Status: AC | PRN
  Administered 2015-08-21: 100 mL via INTRAVENOUS

## 2015-08-22 ENCOUNTER — Other Ambulatory Visit: Payer: Medicare Other

## 2015-09-18 ENCOUNTER — Telehealth: Payer: Self-pay | Admitting: Interventional Cardiology

## 2015-09-18 NOTE — Telephone Encounter (Signed)
New Message  Pt wife calling to speak w/ RN concerning surgical clearance. Pt wife was told that Dr Irven [place] office had faxed clearance paperwork to Dr Tamala Julian, but have not heard anything. Please call back and discuss  Can leave detailed message on VM

## 2015-09-19 NOTE — Telephone Encounter (Signed)
Left detailed message on pt as requested. Signed cardiac clearance faxed to [facility] attn: Dr.Pool

## 2015-10-02 HISTORY — PX: LUMBAR SPINE SURGERY: SHX701

## 2015-10-18 ENCOUNTER — Other Ambulatory Visit: Payer: Self-pay | Admitting: Neurosurgery

## 2015-10-18 DIAGNOSIS — M48061 Spinal stenosis, lumbar region without neurogenic claudication: Secondary | ICD-10-CM

## 2015-10-20 ENCOUNTER — Ambulatory Visit
Admission: RE | Admit: 2015-10-20 | Discharge: 2015-10-20 | Disposition: A | Discharge status: Home / Self Care | Payer: Medicare Other | Source: Ambulatory Visit | Attending: Neurosurgery | Admitting: Neurosurgery

## 2015-10-20 DIAGNOSIS — M48061 Spinal stenosis, lumbar region without neurogenic claudication: Secondary | ICD-10-CM

## 2015-10-25 ENCOUNTER — Encounter (HOSPITAL_COMMUNITY): Payer: Self-pay | Admitting: *Deleted

## 2015-10-25 ENCOUNTER — Inpatient Hospital Stay (HOSPITAL_COMMUNITY)
Admission: EM | Admit: 2015-10-25 | Discharge: 2015-10-31 | DRG: 872 | Disposition: A | Discharge status: Skilled Nursing Facility | Payer: Medicare Other | Attending: Internal Medicine | Admitting: Internal Medicine

## 2015-10-25 ENCOUNTER — Emergency Department (HOSPITAL_COMMUNITY): Payer: Medicare Other

## 2015-10-25 DIAGNOSIS — I129 Hypertensive chronic kidney disease with stage 1 through stage 4 chronic kidney disease, or unspecified chronic kidney disease: Secondary | ICD-10-CM | POA: Diagnosis present

## 2015-10-25 DIAGNOSIS — E669 Obesity, unspecified: Secondary | ICD-10-CM | POA: Diagnosis present

## 2015-10-25 DIAGNOSIS — Z6831 Body mass index (BMI) 31.0-31.9, adult: Secondary | ICD-10-CM

## 2015-10-25 DIAGNOSIS — M109 Gout, unspecified: Secondary | ICD-10-CM | POA: Diagnosis present

## 2015-10-25 DIAGNOSIS — D72829 Elevated white blood cell count, unspecified: Secondary | ICD-10-CM | POA: Diagnosis present

## 2015-10-25 DIAGNOSIS — I252 Old myocardial infarction: Secondary | ICD-10-CM

## 2015-10-25 DIAGNOSIS — N3080 Other cystitis without hematuria: Secondary | ICD-10-CM | POA: Diagnosis not present

## 2015-10-25 DIAGNOSIS — Z79899 Other long term (current) drug therapy: Secondary | ICD-10-CM

## 2015-10-25 DIAGNOSIS — N183 Chronic kidney disease, stage 3 unspecified: Secondary | ICD-10-CM | POA: Diagnosis present

## 2015-10-25 DIAGNOSIS — E871 Hypo-osmolality and hyponatremia: Secondary | ICD-10-CM | POA: Diagnosis present

## 2015-10-25 DIAGNOSIS — E875 Hyperkalemia: Secondary | ICD-10-CM | POA: Diagnosis present

## 2015-10-25 DIAGNOSIS — E1122 Type 2 diabetes mellitus with diabetic chronic kidney disease: Secondary | ICD-10-CM | POA: Diagnosis present

## 2015-10-25 DIAGNOSIS — I25810 Atherosclerosis of coronary artery bypass graft(s) without angina pectoris: Secondary | ICD-10-CM | POA: Diagnosis present

## 2015-10-25 DIAGNOSIS — A419 Sepsis, unspecified organism: Secondary | ICD-10-CM | POA: Diagnosis not present

## 2015-10-25 DIAGNOSIS — Z905 Acquired absence of kidney: Secondary | ICD-10-CM

## 2015-10-25 DIAGNOSIS — M8580 Other specified disorders of bone density and structure, unspecified site: Secondary | ICD-10-CM | POA: Diagnosis present

## 2015-10-25 DIAGNOSIS — M1990 Unspecified osteoarthritis, unspecified site: Secondary | ICD-10-CM | POA: Diagnosis present

## 2015-10-25 DIAGNOSIS — E119 Type 2 diabetes mellitus without complications: Secondary | ICD-10-CM

## 2015-10-25 DIAGNOSIS — N179 Acute kidney failure, unspecified: Secondary | ICD-10-CM | POA: Diagnosis present

## 2015-10-25 DIAGNOSIS — K219 Gastro-esophageal reflux disease without esophagitis: Secondary | ICD-10-CM | POA: Diagnosis present

## 2015-10-25 DIAGNOSIS — Z7983 Long term (current) use of bisphosphonates: Secondary | ICD-10-CM

## 2015-10-25 DIAGNOSIS — M810 Age-related osteoporosis without current pathological fracture: Secondary | ICD-10-CM | POA: Diagnosis present

## 2015-10-25 DIAGNOSIS — I10 Essential (primary) hypertension: Secondary | ICD-10-CM | POA: Diagnosis present

## 2015-10-25 DIAGNOSIS — N3941 Urge incontinence: Secondary | ICD-10-CM | POA: Diagnosis present

## 2015-10-25 DIAGNOSIS — E785 Hyperlipidemia, unspecified: Secondary | ICD-10-CM | POA: Diagnosis present

## 2015-10-25 DIAGNOSIS — B9620 Unspecified Escherichia coli [E. coli] as the cause of diseases classified elsewhere: Secondary | ICD-10-CM | POA: Diagnosis present

## 2015-10-25 DIAGNOSIS — Z8546 Personal history of malignant neoplasm of prostate: Secondary | ICD-10-CM

## 2015-10-25 DIAGNOSIS — Z87891 Personal history of nicotine dependence: Secondary | ICD-10-CM

## 2015-10-25 DIAGNOSIS — Z7984 Long term (current) use of oral hypoglycemic drugs: Secondary | ICD-10-CM

## 2015-10-25 DIAGNOSIS — Z880 Allergy status to penicillin: Secondary | ICD-10-CM

## 2015-10-25 LAB — CBC WITH DIFFERENTIAL/PLATELET
Basophils Absolute: 0 10*3/uL (ref 0.0–0.1)
Basophils Relative: 0 %
Eosinophils Absolute: 0 10*3/uL (ref 0.0–0.7)
Eosinophils Relative: 0 %
HEMATOCRIT: 37 % — AB (ref 39.0–52.0)
HEMOGLOBIN: 12.4 g/dL — AB (ref 13.0–17.0)
LYMPHS ABS: 0.6 10*3/uL — AB (ref 0.7–4.0)
Lymphocytes Relative: 3 %
MCH: 32 pg (ref 26.0–34.0)
MCHC: 33.5 g/dL (ref 30.0–36.0)
MCV: 95.6 fL (ref 78.0–100.0)
MONOS PCT: 4 %
Monocytes Absolute: 0.8 10*3/uL (ref 0.1–1.0)
NEUTROS ABS: 16.6 10*3/uL — AB (ref 1.7–7.7)
NEUTROS PCT: 93 %
Platelets: 260 10*3/uL (ref 150–400)
RBC: 3.87 MIL/uL — ABNORMAL LOW (ref 4.22–5.81)
RDW: 15.7 % — ABNORMAL HIGH (ref 11.5–15.5)
WBC: 17.9 10*3/uL — ABNORMAL HIGH (ref 4.0–10.5)

## 2015-10-25 LAB — CBG MONITORING, ED: Glucose-Capillary: 299 mg/dL — ABNORMAL HIGH (ref 65–99)

## 2015-10-25 MED ORDER — LEVOFLOXACIN IN D5W 750 MG/150ML IV SOLN
750.0000 mg | Freq: Once | INTRAVENOUS | Status: AC
Start: 2015-10-26 — End: 2015-10-26
  Administered 2015-10-26: 750 mg via INTRAVENOUS
  Filled 2015-10-25: qty 150

## 2015-10-25 MED ORDER — SODIUM CHLORIDE 0.9 % IV BOLUS (SEPSIS)
500.0000 mL | Freq: Once | INTRAVENOUS | Status: AC
  Administered 2015-10-25: 500 mL via INTRAVENOUS

## 2015-10-25 MED ORDER — AZTREONAM 2 G IJ SOLR
2.0000 g | Freq: Once | INTRAMUSCULAR | Status: AC
  Administered 2015-10-26: 2 g via INTRAVENOUS
  Filled 2015-10-25: qty 2

## 2015-10-25 MED ORDER — VANCOMYCIN HCL IN DEXTROSE 1-5 GM/200ML-% IV SOLN: 1000.0000 mg | Freq: Once | INTRAVENOUS | Status: DC

## 2015-10-25 MED ORDER — SODIUM CHLORIDE 0.9 % IV BOLUS (SEPSIS)
1000.0000 mL | INTRAVENOUS | Status: AC
  Administered 2015-10-25 – 2015-10-26 (×3): 1000 mL via INTRAVENOUS

## 2015-10-25 MED ORDER — VANCOMYCIN HCL 10 G IV SOLR
1750.0000 mg | Freq: Once | INTRAVENOUS | Status: AC
  Administered 2015-10-26: 1750 mg via INTRAVENOUS
  Filled 2015-10-25: qty 1750

## 2015-10-25 NOTE — Progress Notes (Signed)
ANTIBIOTIC CONSULT NOTE - INITIAL  Pharmacy Consult for Aztreonam, Vancomycin, Levaquin Indication: Sepsis   Allergies  Allergen Reactions  . Adacel [Diphth-Acell Pertussis-Tetanus]   . Lipitor [Atorvastatin] Other (See Comments)    Muscle pain  . Penicillins     UNKNOWN  . Septra [Sulfamethoxazole-Trimethoprim]   . Simvastatin     MUSCLE PAIN    Patient Measurements: Weight: 200 lb (90.719 kg)   Vital Signs: Temp: 101 F (38.3 C) (11/04 2338) BP: 139/74 mmHg (11/04 2338) Pulse Rate: 129 (11/04 2338) Intake/Output from previous day:   Intake/Output from this shift:    Labs:  Recent Labs  10/25/15 2330  WBC 17.9*  HGB 12.4*  PLT 260   CrCl cannot be calculated (Unknown ideal weight.). No results for input(s): VANCOTROUGH, VANCOPEAK, VANCORANDOM, GENTTROUGH, GENTPEAK, GENTRANDOM, TOBRATROUGH, TOBRAPEAK, TOBRARND, AMIKACINPEAK, AMIKACINTROU, AMIKACIN in the last 72 hours.   Microbiology: No results found for this or any previous visit (from the past 720 hour(s)).  Medical History: Past Medical History  Diagnosis Date  . Hyperlipidemia   . Gout   . Glaucoma     "high normal"  . Diabetes mellitus without complication ([place])   . Myocardial infarction [facility])     "told me I had a small heart attack"  . GERD (gastroesophageal reflux disease)   . Cancer [facility]) 2010    prostate  . Arthritis   . H/O blood clots     R Lower leg, right upper leg  . Coronary artery disease   . Low back pain     "severe"  . Polymyalgia ([place])     h/o  . Osteoporosis   . Osteopenia   . Obesity   . Azotemia   . Thrombophlebitis   . Tinnitus   . Joint pain   . Heel spur   . Spinal stenosis   . Renal cyst, left   . Shingles     LEFT ABDOMEN  . Tubular adenoma   . Lesion of left native kidney   . Mild aortic stenosis   . Chondromalacia     Medications:   (Not in a hospital admission) Assessment: 88 YOM who presented to the ED for chills x 1 week. He reported recent back  surgery in October 2016. PHarmacy consulted to start empiric antibiotics for sepsis. WBC is elevated at 17.9. Tm 101F. LA 2.54. SCr 2.17.   Goal of Therapy:  Vancomycin trough level 15-20 mcg/ml  Plan:  -Aztreonam 2 gm IV load in the ED followed by 1 gm IV Q 8 hours -Vancomycin 1750 mg IV once in the ED followed by 1 gm IV Q 24 hours -Levaquin 750 mg IV Q 48 hours -Monitor CBC, renal fx, cultures and clinical progress -[facility] [place], PharmD., BCPS Clinical Pharmacist

## 2015-10-25 NOTE — ED Provider Notes (Signed)
CSN: [id number]     Arrival date & time 10/25/15  2319 History  By signing my name below, I, Randa Evens, attest that this documentation has been prepared under the direction and in the presence of Theressa Millard, MD. Electronically Signed: Randa Evens, ED Scribe. 10/26/2015. 2:11 AM.     Chief Complaint  Patient presents with  . Altered Mental Status    Patient is a 81 y.o. male presenting with altered mental status. The history is provided by the patient. No language interpreter was used.  Altered Mental Status Presenting symptoms: confusion   Associated symptoms: abdominal pain, fever, nausea and vomiting   Associated symptoms: no headaches, no light-headedness, no palpitations, no rash and no weakness    HPI Comments: [name] is a 81 y.o. male who presents to the Emergency Department for chills onset 1 week prior. Wife states that he has had intermittent chills for the past week. Pt does report recent back surgery on October 12th, 2016 Pt states that he is currently having pain in his back that is worse than the normal back pain after having the surgery. Pt states that he followed up with the surgeon 2 days ago which told him everything was progressing normally. Last BM was 1 day prior. Denies cough, diarrhea, constipation, new weakness or new numbness. Increased confusion this evening per wife. 1 episode of vomiting.   Past Medical History  Diagnosis Date  . Hyperlipidemia   . Gout   . Glaucoma     "high normal"  . Diabetes mellitus without complication ([place])   . Myocardial infarction [facility])     "told me I had a small heart attack"  . GERD (gastroesophageal reflux disease)   . Cancer [facility]) 2010    prostate  . Arthritis   . H/O blood clots     R Lower leg, right upper leg  . Coronary artery disease   . Low back pain     "severe"  . Polymyalgia ([place])     h/o  . Osteoporosis   . Osteopenia   . Obesity   . Azotemia   . Thrombophlebitis   . Tinnitus   .  Joint pain   . Heel spur   . Spinal stenosis   . Renal cyst, left   . Shingles     LEFT ABDOMEN  . Tubular adenoma   . Lesion of left native kidney   . Mild aortic stenosis   . Chondromalacia    Past Surgical History  Procedure Laterality Date  . Prostate surgery      seed implant  . Coronary artery bypass graft  2007  . Vein ligation and stripping Right 1972    right lower leg  . Appendectomy  60 years ago  . Cardiac catheterization    . Robotic assited partial nephrectomy Left 08/31/2013    Procedure: ROBOTIC ASSITED PARTIAL NEPHRECTOMY;  Surgeon: Dutch Gray, MD;  Location: [facility];  Service: Urology;  Laterality: Left;   Family History  Problem Relation Age of Onset  . Heart Problems Mother   . Heart Problems Father    Social History  Substance Use Topics  . Smoking status: Former Smoker -- 3.00 packs/day for 30 years    Types: Cigarettes    Quit date: 12/21/1980  . Smokeless tobacco: Never Used  . Alcohol Use: No    Review of Systems  Constitutional: Positive for fever, chills and fatigue.  Respiratory: Negative for cough and shortness of breath.  Cardiovascular: Negative for chest pain, palpitations and leg swelling.  Gastrointestinal: Positive for nausea, vomiting and abdominal pain. Negative for diarrhea, constipation and blood in stool.  Genitourinary: Negative for dysuria and flank pain.  Musculoskeletal: Positive for back pain. Negative for neck pain and neck stiffness.  Skin: Negative for rash and wound.  Neurological: Negative for dizziness, weakness, light-headedness, numbness and headaches.  Psychiatric/Behavioral: Positive for confusion.  All other systems reviewed and are negative.     Allergies  Adacel; Lipitor; Penicillins; Septra; and Simvastatin  Home Medications   Prior to Admission medications   Medication Sig Start Date End Date Taking? Authorizing Provider  acetaminophen (TYLENOL) 325 MG tablet Take 1 tablet (325 mg total) by mouth  every 6 (six) hours as needed for pain (1-2 tablets q 6 hrs prn pain). 09/03/13  Yes Festus Aloe, MD  alendronate (FOSAMAX) 70 MG tablet Take 70 mg by mouth daily. Take with a full glass of water on an empty stomach. Patient takes this medication once weekly on Saturday.   Yes Historical Provider, MD  allopurinol (ZYLOPRIM) 300 MG tablet Take 300 mg by mouth daily. 02/07/15  Yes Historical Provider, MD  glipiZIDE (GLUCOTROL) 10 MG tablet Take 10 mg by mouth 2 (two) times daily before a meal.   Yes Historical Provider, MD  HYDROcodone-acetaminophen (NORCO) 10-325 MG tablet Take 1.5 tablets by mouth every 6 (six) hours as needed.  10/17/15  Yes Historical Provider, MD  rosuvastatin (CRESTOR) 5 MG tablet Take 5 mg by mouth daily. 09/28/15  Yes Historical Provider, MD  tamsulosin (FLOMAX) 0.4 MG CAPS capsule Take 0.4 mg by mouth daily. 01/18/15  Yes Historical Provider, MD   BP 111/70 mmHg  Pulse 89  Temp(Src) 100.5 F (38.1 C) (Oral)  Resp 28  Ht 5' 7.32" (1.71 m)  Wt 200 lb (90.719 kg)  BMI 31.02 kg/m2  SpO2 99%   Physical Exam  Constitutional: He is oriented to person, place, and time. He appears well-developed and well-nourished. No distress.  HENT:  Head: Normocephalic and atraumatic.  Mouth/Throat: Oropharynx is clear and moist. No oropharyngeal exudate.  Eyes: EOM are normal. Pupils are equal, round, and reactive to light.  Neck: Normal range of motion. Neck supple.  Cardiovascular: Regular rhythm.   Tachycardia  Pulmonary/Chest: No respiratory distress. He has no wheezes. He has rales.  Tachypnea. Rales in left base  Abdominal: Soft. Bowel sounds are normal. He exhibits no distension and no mass. There is tenderness (diffuse abdominal tenderness worse especially in the left upper and lower quadrants). There is no guarding.  Musculoskeletal: Normal range of motion. He exhibits tenderness. He exhibits no edema.  Tenderness to palpation over the lumbar spine. There is no evidence of  erythema, warmth or masses. No calf swelling or tenderness.  Neurological: He is alert and oriented to person, place, and time.  Patient moving all extremities without deficit. Sensation is fully intact. Hesitation when answering questions.  Skin: Skin is warm and dry. No rash noted. No erythema.  Psychiatric: He has a normal mood and affect. His behavior is normal.  Nursing note and vitals reviewed.   ED Course  Procedures (including critical care time) DIAGNOSTIC STUDIES: Oxygen Saturation is 95% on RA, adequate by my interpretation.    COORDINATION OF CARE:    Labs Review Labs Reviewed  COMPREHENSIVE METABOLIC PANEL - Abnormal; Notable for the following:    Sodium 130 (*)    Potassium 5.4 (*)    Chloride 94 (*)    CO2  20 (*)    Glucose, Bld 350 (*)    BUN 32 (*)    Creatinine, Ser 2.17 (*)    Albumin 2.7 (*)    GFR calc non Af Amer 27 (*)    GFR calc Af Amer 32 (*)    Anion gap 16 (*)    All other components within normal limits  CBC WITH DIFFERENTIAL/PLATELET - Abnormal; Notable for the following:    WBC 17.9 (*)    RBC 3.87 (*)    Hemoglobin 12.4 (*)    HCT 37.0 (*)    RDW 15.7 (*)    Neutro Abs 16.6 (*)    Lymphs Abs 0.6 (*)    All other components within normal limits  URINALYSIS, ROUTINE W REFLEX MICROSCOPIC (NOT AT [facility]) - Abnormal; Notable for the following:    Color, Urine AMBER (*)    APPearance TURBID (*)    Glucose, UA >1000 (*)    Hgb urine dipstick LARGE (*)    Bilirubin Urine SMALL (*)    Ketones, ur 15 (*)    Protein, ur 100 (*)    Nitrite POSITIVE (*)    Leukocytes, UA SMALL (*)    All other components within normal limits  URINE MICROSCOPIC-ADD ON - Abnormal; Notable for the following:    Bacteria, UA MANY (*)    All other components within normal limits  CBG MONITORING, ED - Abnormal; Notable for the following:    Glucose-Capillary 299 (*)    All other components within normal limits  CBG MONITORING, ED - Abnormal; Notable for the  following:    Glucose-Capillary 216 (*)    All other components within normal limits  I-STAT CG4 LACTIC ACID, ED - Abnormal; Notable for the following:    Lactic Acid, Venous 2.54 (*)    All other components within normal limits  CULTURE, BLOOD (ROUTINE X 2)  CULTURE, BLOOD (ROUTINE X 2)  URINE CULTURE  LIPASE, BLOOD  I-STAT TROPOININ, ED    Imaging Review Ct Abdomen Pelvis Wo Contrast  10/26/2015  CLINICAL DATA:  81 yr old male with high WBC, Abdominal pain, sepsis, and recent lumbar surgery. EXAM: CT ABDOMEN AND PELVIS WITHOUT CONTRAST TECHNIQUE: Multidetector CT imaging of the abdomen and pelvis was performed following the standard protocol without IV contrast. COMPARISON:  08/21/2015 FINDINGS: Lung bases: Minor subsegmental atelectasis. Otherwise clear. Heart normal size. Liver: Diffuse fatty infiltration.  No mass or focal lesion. Gallbladder and biliary tree:  Unremarkable. Spleen:  Small calcifications otherwise unremarkable. Pancreas:  Unremarkable. Adrenal glands:  No masses. Kidneys, ureters, bladder: Bladder is thick-walled with air within the inner margin of the wall is well is nondependent air within the lumen of the gallbladder. Ureters are normal course and in caliber. Kidney show areas of scarring mostly on the left. There small nonobstructing stones in each kidney. Two small low-density lesions in the right kidney noted consistent with cysts. These findings in the kidneys are stable from the prior CT. Bladder findings are new. Lymph nodes:  No adenopathy. Ascites:  None. Vascular: Dense atherosclerotic calcifications along a normal caliber aorta its branch vessels. Prostate: Multiple radiation therapy seeds lie within the prostate and prostate bed, stable. Gastrointestinal: There multiple colonic diverticula mostly along the left colon. No diverticulitis. No bowel wall thickening or inflammatory changes. No evidence of obstruction. Normal small bowel and stomach. Musculoskeletal:  Laminectomies have been performed at L4 from the recent lumbar spine surgery. There is a minimal amount of epidural air in this  location as well as a small amount of air along the right laminectomy defect. There is no evidence a postoperative abscess. No osteoblastic or osteolytic lesions. Degenerative changes noted throughout the lumbar spine. Abdominal wall: Small midline fat containing hernias. No bowel enters these. IMPRESSION: 1. Emphysematous cystitis. Bladder wall is thickened with air along the mucosal margin of the wall. There is also nondependent air within the urinary bladder. 2. No other acute findings. No hydronephrosis. No evidence of a postoperative abscess from the recent lumbar spine surgery. No evidence of diverticulitis. 3. Chronic changes include hepatic steatosis, left renal scarring and multiple colonic diverticula as well as degenerative changes throughout the visualized spine. Electronically Signed   By: Lajean Manes M.D.   On: 10/26/2015 01:18   Dg Chest Port 1 View  10/26/2015  CLINICAL DATA:  pt from home w/ c/o confusion and AMS, pt w/ recent hx of back surgery and taking vicodin for pain management. Pt has a hx of bypass x4 in 2007 and is taking medication for type 2 diabetes. EXAM: PORTABLE CHEST 1 VIEW COMPARISON:  06/10/2015 FINDINGS: Changes from CABG surgery, stable. Cardiac silhouette is normal in size and configuration. No mediastinal or hilar masses or evidence of adenopathy. Clear lungs.  No pleural effusion or pneumothorax. Bony thorax is demineralized but grossly intact. IMPRESSION: No acute cardiopulmonary disease. Electronically Signed   By: Lajean Manes M.D.   On: 10/26/2015 00:09      EKG Interpretation   Date/Time:  Friday October 25 2015 23:29:07 EDT Ventricular Rate:  127 PR Interval:  154 QRS Duration: 112 QT Interval:  293 QTC Calculation: 426 R Axis:   -24 Text Interpretation:  Sinus tachycardia Probable left atrial enlargement  LVH with IVCD and  secondary repol abnrm Confirmed by Lita Mains  MD, Chynna Buerkle  ([id number]) on 10/26/2015 2:11:23 AM     CRITICAL CARE Performed by: Lita Mains, Koran Seabrook Total critical care time: 30  minutes Critical care time was exclusive of separately billable procedures and treating other patients. Critical care was necessary to treat or prevent imminent or life-threatening deterioration. Critical care was time spent personally by me on the following activities: development of treatment plan with patient and/or surrogate as well as nursing, discussions with consultants, evaluation of patient's response to treatment, examination of patient, obtaining history from patient or surrogate, ordering and performing treatments and interventions, ordering and review of laboratory studies, ordering and review of radiographic studies, pulse oximetry and re-evaluation of patient's condition. MDM   Final diagnoses:  Emphysematous cystitis  Sepsis, due to unspecified organism [facility])      I personally performed the services described in this documentation, which was scribed in my presence. The recorded information has been reviewed and is accurate.   Initiated sepsis protocol due to fever, tachycardia and tachypnea. Diffusely tender abdominal exam. Rales in the left base. Recent hospitalization for lumbar surgery. Chest x-ray without any evidence of pneumonia. Elevation in creatinine. CT abdomen and pelvis without contrast ordered to evaluate for intra-abdominal infection versus post-operative infectious process. Patient has history of penicillin allergy. Unknown reaction. Started on broad-spectrum antibiotics and given 3 L of IV fluids in the emergency department. CT with evidence of emphysematous cystitis.   Discussed with Dr. Arnoldo Morale. Will admit to step down bed.  Julianne Rice, MD 10/26/15 [phone]

## 2015-10-25 NOTE — ED Notes (Signed)
Per [facility] - pt from home w/ c/o confusion and AMS, pt w/ recent hx of back surgery and taking vicodin for pain management. Pt took 1.5 tabs approx 1700, was last seen normal approx 20:30. Pt awoke in his chair approx 2245 w/ one episode n/v and acute confusion and unable to answer questions. Per EMS en route pt's confusion seems to be resolving as pt is becoming more oriented. Pt A&Ox3 on arrival to department. Per pt's wife, pt has been experiencing intermittent episodes of chills for the past few days as well.

## 2015-10-26 ENCOUNTER — Emergency Department (HOSPITAL_COMMUNITY): Payer: Medicare Other

## 2015-10-26 DIAGNOSIS — I10 Essential (primary) hypertension: Secondary | ICD-10-CM | POA: Diagnosis not present

## 2015-10-26 DIAGNOSIS — M1990 Unspecified osteoarthritis, unspecified site: Secondary | ICD-10-CM | POA: Diagnosis present

## 2015-10-26 DIAGNOSIS — N183 Chronic kidney disease, stage 3 unspecified: Secondary | ICD-10-CM | POA: Diagnosis present

## 2015-10-26 DIAGNOSIS — Z87891 Personal history of nicotine dependence: Secondary | ICD-10-CM | POA: Diagnosis not present

## 2015-10-26 DIAGNOSIS — B9620 Unspecified Escherichia coli [E. coli] as the cause of diseases classified elsewhere: Secondary | ICD-10-CM | POA: Diagnosis present

## 2015-10-26 DIAGNOSIS — N179 Acute kidney failure, unspecified: Secondary | ICD-10-CM | POA: Diagnosis present

## 2015-10-26 DIAGNOSIS — E669 Obesity, unspecified: Secondary | ICD-10-CM | POA: Diagnosis present

## 2015-10-26 DIAGNOSIS — N3080 Other cystitis without hematuria: Secondary | ICD-10-CM | POA: Diagnosis present

## 2015-10-26 DIAGNOSIS — Z8546 Personal history of malignant neoplasm of prostate: Secondary | ICD-10-CM | POA: Diagnosis not present

## 2015-10-26 DIAGNOSIS — Z7984 Long term (current) use of oral hypoglycemic drugs: Secondary | ICD-10-CM | POA: Diagnosis not present

## 2015-10-26 DIAGNOSIS — E785 Hyperlipidemia, unspecified: Secondary | ICD-10-CM | POA: Diagnosis present

## 2015-10-26 DIAGNOSIS — E875 Hyperkalemia: Secondary | ICD-10-CM | POA: Diagnosis present

## 2015-10-26 DIAGNOSIS — E1122 Type 2 diabetes mellitus with diabetic chronic kidney disease: Secondary | ICD-10-CM | POA: Diagnosis present

## 2015-10-26 DIAGNOSIS — E871 Hypo-osmolality and hyponatremia: Secondary | ICD-10-CM

## 2015-10-26 DIAGNOSIS — Z905 Acquired absence of kidney: Secondary | ICD-10-CM | POA: Diagnosis not present

## 2015-10-26 DIAGNOSIS — M8580 Other specified disorders of bone density and structure, unspecified site: Secondary | ICD-10-CM | POA: Diagnosis present

## 2015-10-26 DIAGNOSIS — Z7983 Long term (current) use of bisphosphonates: Secondary | ICD-10-CM | POA: Diagnosis not present

## 2015-10-26 DIAGNOSIS — I25810 Atherosclerosis of coronary artery bypass graft(s) without angina pectoris: Secondary | ICD-10-CM | POA: Diagnosis present

## 2015-10-26 DIAGNOSIS — M810 Age-related osteoporosis without current pathological fracture: Secondary | ICD-10-CM | POA: Diagnosis present

## 2015-10-26 DIAGNOSIS — A419 Sepsis, unspecified organism: Principal | ICD-10-CM | POA: Diagnosis present

## 2015-10-26 DIAGNOSIS — K219 Gastro-esophageal reflux disease without esophagitis: Secondary | ICD-10-CM | POA: Diagnosis present

## 2015-10-26 DIAGNOSIS — Z6831 Body mass index (BMI) 31.0-31.9, adult: Secondary | ICD-10-CM | POA: Diagnosis not present

## 2015-10-26 DIAGNOSIS — I252 Old myocardial infarction: Secondary | ICD-10-CM | POA: Diagnosis not present

## 2015-10-26 DIAGNOSIS — D72829 Elevated white blood cell count, unspecified: Secondary | ICD-10-CM

## 2015-10-26 DIAGNOSIS — A4151 Sepsis due to Escherichia coli [E. coli]: Secondary | ICD-10-CM | POA: Diagnosis not present

## 2015-10-26 DIAGNOSIS — M109 Gout, unspecified: Secondary | ICD-10-CM | POA: Diagnosis present

## 2015-10-26 DIAGNOSIS — N3941 Urge incontinence: Secondary | ICD-10-CM | POA: Diagnosis present

## 2015-10-26 DIAGNOSIS — I129 Hypertensive chronic kidney disease with stage 1 through stage 4 chronic kidney disease, or unspecified chronic kidney disease: Secondary | ICD-10-CM | POA: Diagnosis present

## 2015-10-26 DIAGNOSIS — E119 Type 2 diabetes mellitus without complications: Secondary | ICD-10-CM | POA: Diagnosis not present

## 2015-10-26 DIAGNOSIS — Z79899 Other long term (current) drug therapy: Secondary | ICD-10-CM | POA: Diagnosis not present

## 2015-10-26 DIAGNOSIS — Z880 Allergy status to penicillin: Secondary | ICD-10-CM | POA: Diagnosis not present

## 2015-10-26 LAB — COMPREHENSIVE METABOLIC PANEL
ALBUMIN: 2.7 g/dL — AB (ref 3.5–5.0)
ALT: 17 U/L (ref 17–63)
AST: 26 U/L (ref 15–41)
Alkaline Phosphatase: 85 U/L (ref 38–126)
Anion gap: 16 — ABNORMAL HIGH (ref 5–15)
BUN: 32 mg/dL — AB (ref 6–20)
CHLORIDE: 94 mmol/L — AB (ref 101–111)
CO2: 20 mmol/L — AB (ref 22–32)
CREATININE: 2.17 mg/dL — AB (ref 0.61–1.24)
Calcium: 9.4 mg/dL (ref 8.9–10.3)
GFR calc Af Amer: 32 mL/min — ABNORMAL LOW (ref >60)
GFR calc non Af Amer: 27 mL/min — ABNORMAL LOW (ref >60)
Glucose, Bld: 350 mg/dL — ABNORMAL HIGH (ref 65–99)
POTASSIUM: 5.4 mmol/L — AB (ref 3.5–5.1)
SODIUM: 130 mmol/L — AB (ref 135–145)
Total Bilirubin: 1.1 mg/dL (ref 0.3–1.2)
Total Protein: 6.9 g/dL (ref 6.5–8.1)

## 2015-10-26 LAB — URINE MICROSCOPIC-ADD ON

## 2015-10-26 LAB — I-STAT CG4 LACTIC ACID, ED: Lactic Acid, Venous: 2.54 mmol/L (ref 0.5–2.0)

## 2015-10-26 LAB — URINALYSIS, ROUTINE W REFLEX MICROSCOPIC
Glucose, UA: >1000 mg/dL — AB
Ketones, ur: 15 mg/dL — AB
Nitrite: POSITIVE — AB
PROTEIN: 100 mg/dL — AB
SPECIFIC GRAVITY, URINE: 1.025 (ref 1.005–1.030)
UROBILINOGEN UA: 1 mg/dL (ref 0.0–1.0)
pH: 5.5 (ref 5.0–8.0)

## 2015-10-26 LAB — BASIC METABOLIC PANEL
Anion gap: 13 (ref 5–15)
BUN: 26 mg/dL — AB (ref 6–20)
CHLORIDE: 101 mmol/L (ref 101–111)
CO2: 21 mmol/L — AB (ref 22–32)
Calcium: 8 mg/dL — ABNORMAL LOW (ref 8.9–10.3)
Creatinine, Ser: 1.9 mg/dL — ABNORMAL HIGH (ref 0.61–1.24)
GFR calc Af Amer: 37 mL/min — ABNORMAL LOW (ref >60)
GFR calc non Af Amer: 32 mL/min — ABNORMAL LOW (ref >60)
GLUCOSE: 192 mg/dL — AB (ref 65–99)
POTASSIUM: 4.8 mmol/L (ref 3.5–5.1)
Sodium: 135 mmol/L (ref 135–145)

## 2015-10-26 LAB — APTT: APTT: 37 s (ref 24–37)

## 2015-10-26 LAB — GLUCOSE, CAPILLARY
Glucose-Capillary: 166 mg/dL — ABNORMAL HIGH (ref 65–99)
Glucose-Capillary: 190 mg/dL — ABNORMAL HIGH (ref 65–99)
Glucose-Capillary: 120 mg/dL — ABNORMAL HIGH (ref 65–99)

## 2015-10-26 LAB — CBC
HEMATOCRIT: 31.8 % — AB (ref 39.0–52.0)
Hemoglobin: 10.4 g/dL — ABNORMAL LOW (ref 13.0–17.0)
MCH: 31.7 pg (ref 26.0–34.0)
MCHC: 32.7 g/dL (ref 30.0–36.0)
MCV: 97 fL (ref 78.0–100.0)
Platelets: 188 10*3/uL (ref 150–400)
RBC: 3.28 MIL/uL — ABNORMAL LOW (ref 4.22–5.81)
RDW: 15.8 % — AB (ref 11.5–15.5)
WBC: 13.2 10*3/uL — AB (ref 4.0–10.5)

## 2015-10-26 LAB — I-STAT TROPONIN, ED: TROPONIN I, POC: 0.05 ng/mL (ref 0.00–0.08)

## 2015-10-26 LAB — MRSA PCR SCREENING: MRSA BY PCR: NEGATIVE

## 2015-10-26 LAB — LACTIC ACID, PLASMA
LACTIC ACID, VENOUS: 1.3 mmol/L (ref 0.5–2.0)
Lactic Acid, Venous: 1.3 mmol/L (ref 0.5–2.0)
Lactic Acid, Venous: 1 mmol/L (ref 0.5–2.0)

## 2015-10-26 LAB — LIPASE, BLOOD: LIPASE: 25 U/L (ref 11–51)

## 2015-10-26 LAB — PROTIME-INR
INR: 1.35 (ref 0.00–1.49)
Prothrombin Time: 16.8 seconds — ABNORMAL HIGH (ref 11.6–15.2)

## 2015-10-26 LAB — CBG MONITORING, ED: Glucose-Capillary: 216 mg/dL — ABNORMAL HIGH (ref 65–99)

## 2015-10-26 LAB — PROCALCITONIN: PROCALCITONIN: 6.87 ng/mL

## 2015-10-26 MED ORDER — ACETAMINOPHEN 650 MG RE SUPP
650.0000 mg | Freq: Four times a day (QID) | RECTAL | Status: DC | PRN
  Administered 2015-10-26: 650 mg via RECTAL
  Filled 2015-10-26: qty 1

## 2015-10-26 MED ORDER — GLUCERNA SHAKE PO LIQD
237.0000 mL | Freq: Two times a day (BID) | ORAL | Status: DC
  Administered 2015-10-26 – 2015-10-31 (×9): 237 mL via ORAL

## 2015-10-26 MED ORDER — INSULIN ASPART 100 UNIT/ML ~~LOC~~ SOLN: 0.0000 [IU] | Freq: Every day | SUBCUTANEOUS | Status: DC

## 2015-10-26 MED ORDER — SODIUM CHLORIDE 0.9 % IV SOLN
INTRAVENOUS | Status: AC
  Administered 2015-10-26: 22:00:00 via INTRAVENOUS
  Administered 2015-10-27: 75 mL/h via INTRAVENOUS

## 2015-10-26 MED ORDER — ACETAMINOPHEN 325 MG PO TABS
650.0000 mg | ORAL_TABLET | Freq: Four times a day (QID) | ORAL | Status: DC | PRN
  Administered 2015-10-26 – 2015-10-29 (×8): 650 mg via ORAL
  Filled 2015-10-26 (×8): qty 2

## 2015-10-26 MED ORDER — ENOXAPARIN SODIUM 30 MG/0.3ML ~~LOC~~ SOLN
30.0000 mg | SUBCUTANEOUS | Status: DC
  Administered 2015-10-26 – 2015-10-27 (×2): 30 mg via SUBCUTANEOUS
  Filled 2015-10-26 (×3): qty 0.3

## 2015-10-26 MED ORDER — IOHEXOL 300 MG/ML  SOLN: 25.0000 mL | Freq: Once | INTRAMUSCULAR | Status: DC | PRN

## 2015-10-26 MED ORDER — INSULIN ASPART 100 UNIT/ML IV SOLN
10.0000 [IU] | Freq: Once | INTRAVENOUS | Status: AC
  Administered 2015-10-26: 10 [IU] via INTRAVENOUS
  Filled 2015-10-26: qty 1

## 2015-10-26 MED ORDER — ALUM & MAG HYDROXIDE-SIMETH 200-200-20 MG/5ML PO SUSP
30.0000 mL | Freq: Four times a day (QID) | ORAL | Status: DC | PRN
  Filled 2015-10-26: qty 30

## 2015-10-26 MED ORDER — ROSUVASTATIN CALCIUM 5 MG PO TABS
5.0000 mg | ORAL_TABLET | Freq: Every day | ORAL | Status: DC
  Administered 2015-10-26 – 2015-10-30 (×5): 5 mg via ORAL
  Filled 2015-10-26 (×6): qty 1

## 2015-10-26 MED ORDER — INSULIN ASPART 100 UNIT/ML ~~LOC~~ SOLN
0.0000 [IU] | Freq: Three times a day (TID) | SUBCUTANEOUS | Status: DC
  Administered 2015-10-26 (×3): 2 [IU] via SUBCUTANEOUS
  Administered 2015-10-27: 1 [IU] via SUBCUTANEOUS
  Administered 2015-10-29: 2 [IU] via SUBCUTANEOUS
  Administered 2015-10-29 – 2015-10-30 (×3): 1 [IU] via SUBCUTANEOUS
  Administered 2015-10-30 (×2): 3 [IU] via SUBCUTANEOUS
  Administered 2015-10-31 (×2): 2 [IU] via SUBCUTANEOUS

## 2015-10-26 MED ORDER — ALLOPURINOL 300 MG PO TABS
300.0000 mg | ORAL_TABLET | Freq: Every day | ORAL | Status: DC
  Administered 2015-10-26 – 2015-10-31 (×6): 300 mg via ORAL
  Filled 2015-10-26 (×5): qty 1
  Filled 2015-10-26: qty 3

## 2015-10-26 MED ORDER — LEVOFLOXACIN IN D5W 750 MG/150ML IV SOLN: 750.0000 mg | INTRAVENOUS | Status: DC

## 2015-10-26 MED ORDER — VANCOMYCIN HCL IN DEXTROSE 1-5 GM/200ML-% IV SOLN
1000.0000 mg | INTRAVENOUS | Status: DC
Start: 2015-10-27 — End: 2015-10-27
  Administered 2015-10-26: 1000 mg via INTRAVENOUS
  Filled 2015-10-26: qty 200

## 2015-10-26 MED ORDER — OXYCODONE HCL 5 MG PO TABS
5.0000 mg | ORAL_TABLET | ORAL | Status: DC | PRN
  Administered 2015-10-26 – 2015-10-31 (×18): 5 mg via ORAL
  Filled 2015-10-26 (×18): qty 1

## 2015-10-26 MED ORDER — SODIUM CHLORIDE 0.9 % IV SOLN
INTRAVENOUS | Status: AC
  Administered 2015-10-26: 03:00:00 via INTRAVENOUS

## 2015-10-26 MED ORDER — TAMSULOSIN HCL 0.4 MG PO CAPS
0.4000 mg | ORAL_CAPSULE | Freq: Every day | ORAL | Status: DC
  Administered 2015-10-26 – 2015-10-31 (×6): 0.4 mg via ORAL
  Filled 2015-10-26 (×6): qty 1

## 2015-10-26 MED ORDER — HYDROMORPHONE HCL 1 MG/ML IJ SOLN
0.5000 mg | INTRAMUSCULAR | Status: DC | PRN
  Administered 2015-10-26 – 2015-10-30 (×2): 1 mg via INTRAVENOUS
  Administered 2015-10-30: 0.5 mg via INTRAVENOUS
  Administered 2015-10-31 (×2): 1 mg via INTRAVENOUS
  Filled 2015-10-26 (×5): qty 1

## 2015-10-26 MED ORDER — ONDANSETRON HCL 4 MG PO TABS
4.0000 mg | ORAL_TABLET | Freq: Four times a day (QID) | ORAL | Status: DC | PRN
  Administered 2015-10-29: 4 mg via ORAL
  Filled 2015-10-26: qty 1

## 2015-10-26 MED ORDER — ONDANSETRON HCL 4 MG/2ML IJ SOLN
4.0000 mg | Freq: Four times a day (QID) | INTRAMUSCULAR | Status: DC | PRN
  Administered 2015-10-26 – 2015-10-27 (×3): 4 mg via INTRAVENOUS
  Filled 2015-10-26 (×3): qty 2

## 2015-10-26 MED ORDER — DEXTROSE 5 % IV SOLN
1.0000 g | Freq: Three times a day (TID) | INTRAVENOUS | Status: DC
  Administered 2015-10-26 – 2015-10-29 (×10): 1 g via INTRAVENOUS
  Filled 2015-10-26 (×13): qty 1

## 2015-10-26 MED ORDER — SODIUM CHLORIDE 0.9 % IV BOLUS (SEPSIS)
250.0000 mL | Freq: Once | INTRAVENOUS | Status: AC
Start: 2015-10-26 — End: 2015-10-26
  Administered 2015-10-26: 250 mL via INTRAVENOUS

## 2015-10-26 MED ORDER — INFLUENZA VAC SPLIT QUAD 0.5 ML IM SUSY
0.5000 mL | PREFILLED_SYRINGE | INTRAMUSCULAR | Status: DC
  Filled 2015-10-26: qty 0.5

## 2015-10-26 NOTE — H&P (Signed)
[facility] Admission History and Physical       [name] [id number] DOB: Apr 27, 1934 DOA: 10/25/2015  Referring physician: EDP PCP: Mathews Argyle, MD  Specialists:   Chief Complaint: Confusion  HPI: [name] is a 81 y.o. male with a history of CAD, DM2, HTN and recent Lumbar surgery 10/02/2015 who presents to the ED with confusion.   His wife is at the bedside and gives the history and reports that he has had complaints of lower ABD pain x 1-2 months, and this evening he had N+V x 1 and then became confused.  He also had less urination over the past 2 days.   He was evaluated in the ED and was found to have a fever to 101, and his lab studies revealed a leukocytosis at 17.9.   A Sepsis workup was initiated, and a CT of the ABD/Pelvis was performed which revealed Emphysematous Cystitis.   He was placed on IV Vancomycin, Levaquin, and Aztreonam and referred for admission.      Review of Systems: Unable to Obtain from the Patient  Past Medical History  Diagnosis Date  . Hyperlipidemia   . Gout   . Glaucoma     "high normal"  . Diabetes mellitus without complication ([place])   . Myocardial infarction [facility])     "told me I had a small heart attack"  . GERD (gastroesophageal reflux disease)   . Cancer [facility]) 2010    prostate  . Arthritis   . H/O blood clots     R Lower leg, right upper leg  . Coronary artery disease   . Low back pain     "severe"  . Polymyalgia ([place])     h/o  . Osteoporosis   . Osteopenia   . Obesity   . Azotemia   . Thrombophlebitis   . Tinnitus   . Joint pain   . Heel spur   . Spinal stenosis   . Renal cyst, left   . Shingles     LEFT ABDOMEN  . Tubular adenoma   . Lesion of left native kidney   . Mild aortic stenosis   . Chondromalacia      Past Surgical History  Procedure Laterality Date  . Prostate surgery      seed implant  . Coronary artery bypass graft  2007  . Vein ligation and stripping Right 1972    right  lower leg  . Appendectomy  60 years ago  . Cardiac catheterization    . Robotic assited partial nephrectomy Left 08/31/2013    Procedure: ROBOTIC ASSITED PARTIAL NEPHRECTOMY;  Surgeon: Dutch Gray, MD;  Location: [facility] ORS;  Service: Urology;  Laterality: Left;      Prior to Admission medications   Medication Sig Start Date End Date Taking? Authorizing Provider  acetaminophen (TYLENOL) 325 MG tablet Take 1 tablet (325 mg total) by mouth every 6 (six) hours as needed for pain (1-2 tablets q 6 hrs prn pain). 09/03/13  Yes Festus Aloe, MD  alendronate (FOSAMAX) 70 MG tablet Take 70 mg by mouth daily. Take with a full glass of water on an empty stomach. Patient takes this medication once weekly on Saturday.   Yes Historical Provider, MD  allopurinol (ZYLOPRIM) 300 MG tablet Take 300 mg by mouth daily. 02/07/15  Yes Historical Provider, MD  glipiZIDE (GLUCOTROL) 10 MG tablet Take 10 mg by mouth 2 (two) times daily before a meal.   Yes Historical Provider, MD  HYDROcodone-acetaminophen [facility]) 10-325  MG tablet Take 1.5 tablets by mouth every 6 (six) hours as needed.  10/17/15  Yes Historical Provider, MD  rosuvastatin (CRESTOR) 5 MG tablet Take 5 mg by mouth daily. 09/28/15  Yes Historical Provider, MD  tamsulosin (FLOMAX) 0.4 MG CAPS capsule Take 0.4 mg by mouth daily. 01/18/15  Yes Historical Provider, MD     Allergies  Allergen Reactions  . Adacel [Diphth-Acell Pertussis-Tetanus]   . Lipitor [Atorvastatin] Other (See Comments)    Muscle pain  . Penicillins     UNKNOWN  . Septra [Sulfamethoxazole-Trimethoprim]   . Simvastatin     MUSCLE PAIN    Social History:  reports that he quit smoking about 34 years ago. His smoking use included Cigarettes. He has a 90 pack-year smoking history. He has never used smokeless tobacco. He reports that he does not drink alcohol or use illicit drugs.    Family History  Problem Relation Age of Onset  . Heart Problems Mother   . Heart Problems Father          Physical Exam:  GEN:  Pleasant Elderly Obese 81 y.o. Caucasian male examined and in no acute distress; cooperative with exam Filed Vitals:   10/26/15 0130 10/26/15 0200 10/26/15 0215 10/26/15 0230  BP: 137/86 111/70 104/71 122/67  Pulse: 122 89 113 116  Temp:   99.6 F (37.6 C)   TempSrc:   Oral   Resp: 30 28 25 26   Height:      Weight:      SpO2: 99%  96% 97%   Blood pressure 122/67, pulse 116, temperature 99.6 F (37.6 C), temperature source Oral, resp. rate 26, height 5' 7.32" (1.71 m), weight 90.719 kg (200 lb), SpO2 97 %. PSYCH: He is alert and oriented x4; does not appear anxious does not appear depressed; affect is normal HEENT: Normocephalic and Atraumatic, Mucous membranes pink; PERRLA; EOM intact; Fundi:  Benign;  No scleral icterus, Nares: Patent, Oropharynx: Clear, Fair Dentition,    Neck:  FROM, No Cervical Lymphadenopathy nor Thyromegaly or Carotid Bruit; No JVD; Breasts:: Not examined CHEST WALL: No tenderness CHEST: Normal respiration, clear to auscultation bilaterally HEART: Tachycardic Regular Rhythm; no murmurs rubs or gallops BACK: No kyphosis or scoliosis; No CVA tenderness ABDOMEN: Positive Bowel Sounds, Obese, Soft Non-Tender, No Rebound or Guarding; No Masses, No Organomegaly. Rectal Exam: Not done EXTREMITIES: No Cyanosis, Clubbing, or Edema; No Ulcerations. Genitalia: not examined PULSES: 2+ and symmetric SKIN: Normal hydration no rash or ulceration CNS:  Alert and Oriented x 4, No Focal Deficits Vascular: pulses palpable throughout    Labs on Admission:  Basic Metabolic Panel:  Recent Labs Lab 10/25/15 2330  NA 130*  K 5.4*  CL 94*  CO2 20*  GLUCOSE 350*  BUN 32*  CREATININE 2.17*  CALCIUM 9.4   Liver Function Tests:  Recent Labs Lab 10/25/15 2330  AST 26  ALT 17  ALKPHOS 85  BILITOT 1.1  PROT 6.9  ALBUMIN 2.7*    Recent Labs Lab 10/25/15 2330  LIPASE 25   No results for input(s): AMMONIA in the last 168  hours. CBC:  Recent Labs Lab 10/25/15 2330  WBC 17.9*  NEUTROABS 16.6*  HGB 12.4*  HCT 37.0*  MCV 95.6  PLT 260   Cardiac Enzymes: No results for input(s): CKTOTAL, CKMB, CKMBINDEX, TROPONINI in the last 168 hours.  BNP (last 3 results) No results for input(s): BNP in the last 8760 hours.  ProBNP (last 3 results) No results for input(s): PROBNP in  the last 8760 hours.  CBG:  Recent Labs Lab 10/25/15 2325 10/26/15 0200  GLUCAP 299* 216*    Radiological Exams on Admission: Ct Abdomen Pelvis Wo Contrast  10/26/2015  CLINICAL DATA:  81 yr old male with high WBC, Abdominal pain, sepsis, and recent lumbar surgery. EXAM: CT ABDOMEN AND PELVIS WITHOUT CONTRAST TECHNIQUE: Multidetector CT imaging of the abdomen and pelvis was performed following the standard protocol without IV contrast. COMPARISON:  08/21/2015 FINDINGS: Lung bases: Minor subsegmental atelectasis. Otherwise clear. Heart normal size. Liver: Diffuse fatty infiltration.  No mass or focal lesion. Gallbladder and biliary tree:  Unremarkable. Spleen:  Small calcifications otherwise unremarkable. Pancreas:  Unremarkable. Adrenal glands:  No masses. Kidneys, ureters, bladder: Bladder is thick-walled with air within the inner margin of the wall is well is nondependent air within the lumen of the gallbladder. Ureters are normal course and in caliber. Kidney show areas of scarring mostly on the left. There small nonobstructing stones in each kidney. Two small low-density lesions in the right kidney noted consistent with cysts. These findings in the kidneys are stable from the prior CT. Bladder findings are new. Lymph nodes:  No adenopathy. Ascites:  None. Vascular: Dense atherosclerotic calcifications along a normal caliber aorta its branch vessels. Prostate: Multiple radiation therapy seeds lie within the prostate and prostate bed, stable. Gastrointestinal: There multiple colonic diverticula mostly along the left colon. No  diverticulitis. No bowel wall thickening or inflammatory changes. No evidence of obstruction. Normal small bowel and stomach. Musculoskeletal: Laminectomies have been performed at L4 from the recent lumbar spine surgery. There is a minimal amount of epidural air in this location as well as a small amount of air along the right laminectomy defect. There is no evidence a postoperative abscess. No osteoblastic or osteolytic lesions. Degenerative changes noted throughout the lumbar spine. Abdominal wall: Small midline fat containing hernias. No bowel enters these. IMPRESSION: 1. Emphysematous cystitis. Bladder wall is thickened with air along the mucosal margin of the wall. There is also nondependent air within the urinary bladder. 2. No other acute findings. No hydronephrosis. No evidence of a postoperative abscess from the recent lumbar spine surgery. No evidence of diverticulitis. 3. Chronic changes include hepatic steatosis, left renal scarring and multiple colonic diverticula as well as degenerative changes throughout the visualized spine. Electronically Signed   By: Lajean Manes M.D.   On: 10/26/2015 01:18   Dg Chest Port 1 View  10/26/2015  CLINICAL DATA:  pt from home w/ c/o confusion and AMS, pt w/ recent hx of back surgery and taking vicodin for pain management. Pt has a hx of bypass x4 in 2007 and is taking medication for type 2 diabetes. EXAM: PORTABLE CHEST 1 VIEW COMPARISON:  06/10/2015 FINDINGS: Changes from CABG surgery, stable. Cardiac silhouette is normal in size and configuration. No mediastinal or hilar masses or evidence of adenopathy. Clear lungs.  No pleural effusion or pneumothorax. Bony thorax is demineralized but grossly intact. IMPRESSION: No acute cardiopulmonary disease. Electronically Signed   By: Lajean Manes M.D.   On: 10/26/2015 00:09     EKG: Independently reviewed. Sinus Tachycardia rate = 127        Assessment/Plan:     81 y.o. male with  Principal Problem:   1.     Sepsis ([place])   Sepsis Workup Initiated   IV Vancomycin, Levaquin, and Azactam   IVFs   Active Problems:   2.    Emphysematous cystitis   Consult Urology in AM  IV Antibiotics     3.    Leukocytosis- due to #1   Monitor Trend     4.    Hyperkalemia   IVFs   Monitor K+ Levels     5.    Hyponatremia   IVFs with NSS   Monitor Na+ levels     6.    Diabetes mellitus without complication (HCC)   Hold Glipizide   SSI coverage PRN   Check HbA1c in AM     7.    CAD (coronary artery disease) of artery bypass graft   Continue Rosuvastatin     8.    Essential hypertension   Monitor BPs     9.    CKD (chronic kidney disease), stage III- baseline Cr = 2.2   Monitor BUN/Cr    10.    DVT Prophylaxis   Lovenox    Code Status:     FULL CODE        Family Communication:   Wife at Bedside     Disposition Plan:    Inpatient Status        Time spent:  37 Minutes      Theressa Millard [facility] Pager [phone]   If [place] Please Contact the Day Rounding Team MD for [facility]  If 7PM-7AM, Please Contact Night-Floor Coverage  [email or web address] Password [email or web address] 10/26/2015, 2:51 AM     ADDENDUM:   Patient was seen and examined on 10/26/2015

## 2015-10-26 NOTE — Progress Notes (Signed)
Pt arrived to 2C14 from the ED at 0300.  VS's are stable, temp 99.2, AO with some mixed up words but patient corrects himself, complains of headache and backpain, wife at bedside and pt is resting comfortably.  RN will continue to monitor.

## 2015-10-26 NOTE — Progress Notes (Signed)
[facility] PROGRESS NOTE  [name] [id number] DOB: 07/01/1934 DOA: 10/25/2015  PCP: Mathews Argyle, MD  Brief HPI: 81 year old Caucasian male with a past medical history of coronary artery disease, diabetes, hypertension, who underwent lumbar surgery on October 12 presented to the ED with some confusion, vomiting and fever. He was found to have an abnormal UA with leukocytosis. CT scan revealed emphysematous cystitis. Patient was hospitalized for further management.  Past medical history:  Past Medical History  Diagnosis Date  . Hyperlipidemia   . Gout   . Glaucoma     "high normal"  . Diabetes mellitus without complication ([place])   . Myocardial infarction [facility])     "told me I had a small heart attack"  . GERD (gastroesophageal reflux disease)   . Cancer [facility]) 2010    prostate  . Arthritis   . H/O blood clots     R Lower leg, right upper leg  . Coronary artery disease   . Low back pain     "severe"  . Polymyalgia ([place])     h/o  . Osteoporosis   . Osteopenia   . Obesity   . Azotemia   . Thrombophlebitis   . Tinnitus   . Joint pain   . Heel spur   . Spinal stenosis   . Renal cyst, left   . Shingles     LEFT ABDOMEN  . Tubular adenoma   . Lesion of left native kidney   . Mild aortic stenosis   . Chondromalacia     Consultants: Urology. Phone discussion with neurosurgery.  Procedures: None  Antibiotics: Vancomycin 11/4 Aztreonam 11/4  Levaquin 11/4  Subjective: Patient feels somewhat better this morning, however, continues to complain of lower back pain. Not so much lower abdominal pain. No vomiting since midnight. His wife is at bedside.  Objective: Vital Signs  Filed Vitals:   10/26/15 0500 10/26/15 0600 10/26/15 0630 10/26/15 0700  BP: 124/70 107/66  102/60  Pulse: 114 120 116 115  Temp:  101.6 F (38.7 C) 101.1 F (38.4 C)   TempSrc:  Oral Oral   Resp: 27 29 28 28   Height:      Weight:      SpO2: 95% 94% 95% 96%     Intake/Output Summary (Last 24 hours) at 10/26/15 0750 Last data filed at 10/26/15 0700  Gross per 24 hour  Intake   5650 ml  Output    350 ml  Net   5300 ml   Filed Weights   10/25/15 2338  Weight: 90.719 kg (200 lb)    General appearance: alert, cooperative, appears stated age and no distress Head: Normocephalic, without obvious abnormality, atraumatic Resp: Diminished air entry at bases. No crackles, wheezing. No rhonchi. Cardio: S1, S2 is tachycardic. Regular. No S3, S4. No rubs, bruit. Systolic murmur appreciated over the apex. GI: Abdomen soft. Nontender. Nondistended. Bowel sounds are present. No masses or organomegaly Back: Scar noted in the lower back. No erythema. No tenderness over that area. It is not warm to touch. Extremities: extremities normal, atraumatic, no cyanosis or edema Neurologic: Alert. Mildly distracted. Cranial nerves II-12 intact. No motor deficits appreciated. Able to lift both legs off the bed.  Lab Results:  Basic Metabolic Panel:  Recent Labs Lab 10/25/15 2330 10/26/15 0520  NA 130* 135  K 5.4* 4.8  CL 94* 101  CO2 20* 21*  GLUCOSE 350* 192*  BUN 32* 26*  CREATININE 2.17* 1.90*  CALCIUM 9.4 8.0*  Liver Function Tests:  Recent Labs Lab 10/25/15 2330  AST 26  ALT 17  ALKPHOS 85  BILITOT 1.1  PROT 6.9  ALBUMIN 2.7*    Recent Labs Lab 10/25/15 2330  LIPASE 25   CBC:  Recent Labs Lab 10/25/15 2330 10/26/15 0520  WBC 17.9* 13.2*  NEUTROABS 16.6*  --   HGB 12.4* 10.4*  HCT 37.0* 31.8*  MCV 95.6 97.0  PLT 260 188    CBG:  Recent Labs Lab 10/25/15 2325 10/26/15 0200  GLUCAP 299* 216*    Recent Results (from the past 240 hour(s))  MRSA PCR Screening     Status: None   Collection Time: 10/26/15  4:09 AM  Result Value Ref Range Status   MRSA by PCR NEGATIVE NEGATIVE Final    Comment:        The GeneXpert MRSA Assay (FDA approved for NASAL specimens only), is one component of a comprehensive MRSA  colonization surveillance program. It is not intended to diagnose MRSA infection nor to guide or monitor treatment for MRSA infections.       Studies/Results: Ct Abdomen Pelvis Wo Contrast  10/26/2015  CLINICAL DATA:  81 yr old male with high WBC, Abdominal pain, sepsis, and recent lumbar surgery. EXAM: CT ABDOMEN AND PELVIS WITHOUT CONTRAST TECHNIQUE: Multidetector CT imaging of the abdomen and pelvis was performed following the standard protocol without IV contrast. COMPARISON:  08/21/2015 FINDINGS: Lung bases: Minor subsegmental atelectasis. Otherwise clear. Heart normal size. Liver: Diffuse fatty infiltration.  No mass or focal lesion. Gallbladder and biliary tree:  Unremarkable. Spleen:  Small calcifications otherwise unremarkable. Pancreas:  Unremarkable. Adrenal glands:  No masses. Kidneys, ureters, bladder: Bladder is thick-walled with air within the inner margin of the wall is well is nondependent air within the lumen of the gallbladder. Ureters are normal course and in caliber. Kidney show areas of scarring mostly on the left. There small nonobstructing stones in each kidney. Two small low-density lesions in the right kidney noted consistent with cysts. These findings in the kidneys are stable from the prior CT. Bladder findings are new. Lymph nodes:  No adenopathy. Ascites:  None. Vascular: Dense atherosclerotic calcifications along a normal caliber aorta its branch vessels. Prostate: Multiple radiation therapy seeds lie within the prostate and prostate bed, stable. Gastrointestinal: There multiple colonic diverticula mostly along the left colon. No diverticulitis. No bowel wall thickening or inflammatory changes. No evidence of obstruction. Normal small bowel and stomach. Musculoskeletal: Laminectomies have been performed at L4 from the recent lumbar spine surgery. There is a minimal amount of epidural air in this location as well as a small amount of air along the right laminectomy defect.  There is no evidence a postoperative abscess. No osteoblastic or osteolytic lesions. Degenerative changes noted throughout the lumbar spine. Abdominal wall: Small midline fat containing hernias. No bowel enters these. IMPRESSION: 1. Emphysematous cystitis. Bladder wall is thickened with air along the mucosal margin of the wall. There is also nondependent air within the urinary bladder. 2. No other acute findings. No hydronephrosis. No evidence of a postoperative abscess from the recent lumbar spine surgery. No evidence of diverticulitis. 3. Chronic changes include hepatic steatosis, left renal scarring and multiple colonic diverticula as well as degenerative changes throughout the visualized spine. Electronically Signed   By: Lajean Manes M.D.   On: 10/26/2015 01:18   Dg Chest Port 1 View  10/26/2015  CLINICAL DATA:  pt from home w/ c/o confusion and AMS, pt w/ recent hx of back  surgery and taking vicodin for pain management. Pt has a hx of bypass x4 in 2007 and is taking medication for type 2 diabetes. EXAM: PORTABLE CHEST 1 VIEW COMPARISON:  06/10/2015 FINDINGS: Changes from CABG surgery, stable. Cardiac silhouette is normal in size and configuration. No mediastinal or hilar masses or evidence of adenopathy. Clear lungs.  No pleural effusion or pneumothorax. Bony thorax is demineralized but grossly intact. IMPRESSION: No acute cardiopulmonary disease. Electronically Signed   By: Lajean Manes M.D.   On: 10/26/2015 00:09    Medications:  Scheduled: . allopurinol  300 mg Oral Daily  . aztreonam  1 g Intravenous Q8H  . enoxaparin (LOVENOX) injection  30 mg Subcutaneous Q24H  . feeding supplement (GLUCERNA SHAKE)  237 mL Oral BID BM  . insulin aspart  0-5 Units Subcutaneous QHS  . insulin aspart  0-9 Units Subcutaneous TID WC  . [START ON 10/28/2015] levofloxacin (LEVAQUIN) IV  750 mg Intravenous Q48H  . rosuvastatin  5 mg Oral q1800  . tamsulosin  0.4 mg Oral Daily  . [START ON 10/27/2015] vancomycin   1,000 mg Intravenous Q24H   Continuous: . sodium chloride 75 mL/hr at 10/26/15 0325   [id number] **OR** acetaminophen, alum & mag hydroxide-simeth, HYDROmorphone (DILAUDID) injection, iohexol, iohexol, ondansetron **OR** ondansetron (ZOFRAN) IV, oxyCODONE  Assessment/Plan:  Principal Problem:   Sepsis ([place]) Active Problems:   Diabetes mellitus without complication (HCC)   CAD (coronary artery disease) of artery bypass graft   Essential hypertension   Emphysematous cystitis   Leukocytosis   Hyperkalemia   Hyponatremia   CKD (chronic kidney disease), stage III    Sepsis most likely secondary to emphysematous cystitis Lactic acid level has improved. Procalcitonin elevated at 6.8. Continue IV antibiotics. Await culture data. Patient continues to be tachycardic. Continue IV fluids. Monitor closely. CT findings discussed with Dr. Junious Silk with urology. Bladder scan has been ordered. He will consult on this patient.  Back pain Patient underwent lumbar surgery on October 12. MRI report from October 30 revealed possibility of discitis and epidural abscess. Apparently patient saw Dr. Annette Stable in his office to discuss these findings. Today I spoke to Dr. Cyndy Freeze who is covering for Dr. Annette Stable. He reviewed the office notes as well as the MRI himself. He thinks that these changes are postoperative as was felt by Dr. Annette Stable and not suggestive of infection or an abscess. Patient does not have any neurological deficits. Continue analgesic agents and monitor closely.  Chronic kidney disease stage III Continues to have good urine output. Continue IV fluids. Monitor closely.  Hyponatremia Improved with IV hydration. Potassium level is also normal. Continue to monitor closely.  Diabetes mellitus type 2 Monitor CBGs. On sliding scale coverage. HbA1c is pending.  History of coronary artery disease status post bypass Stable. Continue home medications. He is not noted to be on any antiplatelet agent or a  beta blocker or ACE inhibitor based on his home medication list. This will need to be confirmed with his wife. He is followed by Dr. Tamala Julian.  History of essential hypertension Monitor blood pressures closely in the setting of sepsis.   DVT Prophylaxis: Lovenox    Code Status: Full code  Family Communication: Discussed with the patient and his wife  Disposition Plan: Continue to remain in step down unit.    LOS: 0 days   [facility] Pager [phone] 10/26/2015, 7:50 AM  If 7PM-7AM, please contact night-coverage at [email or web address], password [email or web address]

## 2015-10-26 NOTE — Consult Note (Signed)
Consult: UTI, sepsis Requested by: Dr. Maryland Pink  Chief Complaint: UTI, sepsis  History of Present Illness: Patient has had lower abdominal pain for 1-2 months then developed some nausea and vomiting yesterday evening with some confusion. He also had decreased urine output. In the emergency department he had a temperature to 101 and a white count of 17.9. CT scan of the abdomen and pelvis revealed emphysematous cystitis. I reviewed all the images. Bladder was not distended. Left renal defect present no obvious kidney cancer recurrence. No hydronephrosis.  His UA showed many bacteria. He is on broad-spectrum antibiotics. His white count is down today and his creatinine has come down to 1.9. He has a baseline creatinine of 1.6.  Patient had bothersome frequency, urgency, urge incontinence and dysuria leading up to this event. He was voiding with a good stream.  Today, he feels and looks much better. His wife said he is "75%" improved. Bladder scan was done and measured 19 ml per Mliss Sax his nurse when we spoke.    Prior GU history: Prostate cancer treated in 2010 with brachii therapy. Last PSA and detectable. Chronic renal insufficiency with baseline creatinine of 1.6. Baseline lower urinary tract symptoms on tamsulosin. 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  I reviewed office notes.  Past Medical History  Diagnosis Date  . Hyperlipidemia   . Gout   . Glaucoma     "high normal"  . Diabetes mellitus without complication ([place])   . Myocardial infarction [facility])     "told me I had a small heart attack"  . GERD (gastroesophageal reflux disease)   . Cancer [facility]) 2010    prostate  . Arthritis   . H/O blood clots     R Lower leg, right upper leg  . Coronary artery disease   . Low back pain     "severe"  . Polymyalgia ([place])     h/o  . Osteoporosis   . Osteopenia   . Obesity   . Azotemia   . Thrombophlebitis   . Tinnitus   . Joint pain   .  Heel spur   . Spinal stenosis   . Renal cyst, left   . Shingles     LEFT ABDOMEN  . Tubular adenoma   . Lesion of left native kidney   . Mild aortic stenosis   . Chondromalacia    Past Surgical History  Procedure Laterality Date  . Prostate surgery      seed implant  . Coronary artery bypass graft  2007  . Vein ligation and stripping Right 1972    right lower leg  . Appendectomy  60 years ago  . Cardiac catheterization    . Robotic assited partial nephrectomy Left 08/31/2013    Procedure: ROBOTIC ASSITED PARTIAL NEPHRECTOMY;  Surgeon: Dutch Gray, MD;  Location: [facility] ORS;  Service: Urology;  Laterality: Left;    Home Medications:  Prescriptions prior to admission  Medication Sig Dispense Refill Last Dose  . acetaminophen (TYLENOL) 325 MG tablet Take 1 tablet (325 mg total) by mouth every 6 (six) hours as needed for pain (1-2 tablets q 6 hrs prn pain). 30 tablet 0 Past Week at Unknown time  . alendronate (FOSAMAX) 70 MG tablet Take 70 mg by mouth daily. Take with a full glass of water on an empty stomach. Patient takes this medication once weekly on Saturday.   Past Week at Unknown time  . allopurinol (ZYLOPRIM) 300 MG tablet Take 300 mg  by mouth daily.  2 10/25/2015 at Unknown time  . glipiZIDE (GLUCOTROL) 10 MG tablet Take 10 mg by mouth 2 (two) times daily before a meal.   10/25/2015 at Unknown time  . HYDROcodone-acetaminophen (NORCO) 10-325 MG tablet Take 1.5 tablets by mouth every 6 (six) hours as needed.   0 10/25/2015 at Unknown time  . rosuvastatin (CRESTOR) 5 MG tablet Take 5 mg by mouth daily.  5 10/25/2015 at Unknown time  . tamsulosin (FLOMAX) 0.4 MG CAPS capsule Take 0.4 mg by mouth daily.  0 10/25/2015 at Unknown time   Allergies:  Allergies  Allergen Reactions  . Adacel [Diphth-Acell Pertussis-Tetanus]   . Lipitor [Atorvastatin] Other (See Comments)    Muscle pain  . Penicillins     UNKNOWN  . Septra [Sulfamethoxazole-Trimethoprim]   . Simvastatin     MUSCLE PAIN     Family History  Problem Relation Age of Onset  . Heart Problems Mother   . Heart Problems Father    Social History:  reports that he quit smoking about 34 years ago. His smoking use included Cigarettes. He has a 90 pack-year smoking history. He has never used smokeless tobacco. He reports that he does not drink alcohol or use illicit drugs.  ROS: A complete review of systems was performed.  All systems are negative except for pertinent findings as noted. ROS   Physical Exam:  Vital signs in last 24 hours: Temp:  [98.8 F (37.1 C)-101.6 F (38.7 C)] 98.8 F (37.1 C) (11/05 0800) Pulse Rate:  [89-129] 107 (11/05 0800) Resp:  [21-32] 28 (11/05 0800) BP: (102-147)/(58-111) 111/63 mmHg (11/05 0800) SpO2:  [94 %-100 %] 95 % (11/05 0800) Weight:  [90.719 kg (200 lb)] 90.719 kg (200 lb) (11/04 2338) General:  Alert and oriented, No acute distress HEENT: Normocephalic, atraumatic Lungs: Regular rate and effort Abdomen: Soft, nontender, nondistended, no abdominal masses Back: No CVA tenderness Extremities: No edema Neurologic: Grossly intact GU: urine clear   Laboratory Data:  Results for orders placed or performed during the hospital encounter of 10/25/15 (from the past 24 hour(s))  CBG monitoring, ED     Status: Abnormal   Collection Time: 10/25/15 11:25 PM  Result Value Ref Range   Glucose-Capillary 299 (H) 65 - 99 mg/dL  Comprehensive metabolic panel     Status: Abnormal   Collection Time: 10/25/15 11:30 PM  Result Value Ref Range   Sodium 130 (L) 135 - 145 mmol/L   Potassium 5.4 (H) 3.5 - 5.1 mmol/L   Chloride 94 (L) 101 - 111 mmol/L   CO2 20 (L) 22 - 32 mmol/L   Glucose, Bld 350 (H) 65 - 99 mg/dL   BUN 32 (H) 6 - 20 mg/dL   Creatinine, Ser 2.17 (H) 0.61 - 1.24 mg/dL   Calcium 9.4 8.9 - 10.3 mg/dL   Total Protein 6.9 6.5 - 8.1 g/dL   Albumin 2.7 (L) 3.5 - 5.0 g/dL   AST 26 15 - 41 U/L   ALT 17 17 - 63 U/L   Alkaline Phosphatase 85 38 - 126 U/L   Total Bilirubin  1.1 0.3 - 1.2 mg/dL   GFR calc non Af Amer 27 (L) >60 mL/min   GFR calc Af Amer 32 (L) >60 mL/min   Anion gap 16 (H) 5 - 15  CBC with Differential     Status: Abnormal   Collection Time: 10/25/15 11:30 PM  Result Value Ref Range   WBC 17.9 (H) 4.0 - 10.5 K/uL  RBC 3.87 (L) 4.22 - 5.81 MIL/uL   Hemoglobin 12.4 (L) 13.0 - 17.0 g/dL   HCT 37.0 (L) 39.0 - 52.0 %   MCV 95.6 78.0 - 100.0 fL   MCH 32.0 26.0 - 34.0 pg   MCHC 33.5 30.0 - 36.0 g/dL   RDW 15.7 (H) 11.5 - 15.5 %   Platelets 260 150 - 400 K/uL   Neutrophils Relative % 93 %   Neutro Abs 16.6 (H) 1.7 - 7.7 K/uL   Lymphocytes Relative 3 %   Lymphs Abs 0.6 (L) 0.7 - 4.0 K/uL   Monocytes Relative 4 %   Monocytes Absolute 0.8 0.1 - 1.0 K/uL   Eosinophils Relative 0 %   Eosinophils Absolute 0.0 0.0 - 0.7 K/uL   Basophils Relative 0 %   Basophils Absolute 0.0 0.0 - 0.1 K/uL  Lipase, blood     Status: None   Collection Time: 10/25/15 11:30 PM  Result Value Ref Range   Lipase 25 11 - 51 U/L  I-stat troponin, ED (not at [facility])     Status: None   Collection Time: 10/26/15 12:03 AM  Result Value Ref Range   Troponin i, poc 0.05 0.00 - 0.08 ng/mL   Comment 3          I-Stat CG4 Lactic Acid, ED     Status: Abnormal   Collection Time: 10/26/15 12:05 AM  Result Value Ref Range   Lactic Acid, Venous 2.54 (HH) 0.5 - 2.0 mmol/L   Comment NOTIFIED PHYSICIAN   Urinalysis, Routine w reflex microscopic (not at [facility])     Status: Abnormal   Collection Time: 10/26/15  1:15 AM  Result Value Ref Range   Color, Urine AMBER (A) YELLOW   APPearance TURBID (A) CLEAR   Specific Gravity, Urine 1.025 1.005 - 1.030   pH 5.5 5.0 - 8.0   Glucose, UA >1000 (A) NEGATIVE mg/dL   Hgb urine dipstick LARGE (A) NEGATIVE   Bilirubin Urine SMALL (A) NEGATIVE   Ketones, ur 15 (A) NEGATIVE mg/dL   Protein, ur 100 (A) NEGATIVE mg/dL   Urobilinogen, UA 1.0 0.0 - 1.0 mg/dL   Nitrite POSITIVE (A) NEGATIVE   Leukocytes, UA SMALL (A) NEGATIVE  Urine  microscopic-add on     Status: Abnormal   Collection Time: 10/26/15  1:15 AM  Result Value Ref Range   Squamous Epithelial / LPF RARE RARE   WBC, UA 11-20 <3 WBC/hpf   RBC / HPF TOO NUMEROUS TO COUNT <3 RBC/hpf   Bacteria, UA MANY (A) RARE  CBG monitoring, ED     Status: Abnormal   Collection Time: 10/26/15  2:00 AM  Result Value Ref Range   Glucose-Capillary 216 (H) 65 - 99 mg/dL  MRSA PCR Screening     Status: None   Collection Time: 10/26/15  4:09 AM  Result Value Ref Range   MRSA by PCR NEGATIVE NEGATIVE  Basic metabolic panel     Status: Abnormal   Collection Time: 10/26/15  5:20 AM  Result Value Ref Range   Sodium 135 135 - 145 mmol/L   Potassium 4.8 3.5 - 5.1 mmol/L   Chloride 101 101 - 111 mmol/L   CO2 21 (L) 22 - 32 mmol/L   Glucose, Bld 192 (H) 65 - 99 mg/dL   BUN 26 (H) 6 - 20 mg/dL   Creatinine, Ser 1.90 (H) 0.61 - 1.24 mg/dL   Calcium 8.0 (L) 8.9 - 10.3 mg/dL   GFR calc non Af Wyvonnia Lora  32 (L) >60 mL/min   GFR calc Af Amer 37 (L) >60 mL/min   Anion gap 13 5 - 15  CBC     Status: Abnormal   Collection Time: 10/26/15  5:20 AM  Result Value Ref Range   WBC 13.2 (H) 4.0 - 10.5 K/uL   RBC 3.28 (L) 4.22 - 5.81 MIL/uL   Hemoglobin 10.4 (L) 13.0 - 17.0 g/dL   HCT 31.8 (L) 39.0 - 52.0 %   MCV 97.0 78.0 - 100.0 fL   MCH 31.7 26.0 - 34.0 pg   MCHC 32.7 30.0 - 36.0 g/dL   RDW 15.8 (H) 11.5 - 15.5 %   Platelets 188 150 - 400 K/uL  Lactic acid, plasma     Status: None   Collection Time: 10/26/15  5:20 AM  Result Value Ref Range   Lactic Acid, Venous 1.3 0.5 - 2.0 mmol/L  Procalcitonin     Status: None   Collection Time: 10/26/15  5:20 AM  Result Value Ref Range   Procalcitonin 6.87 ng/mL  Protime-INR     Status: Abnormal   Collection Time: 10/26/15  5:20 AM  Result Value Ref Range   Prothrombin Time 16.8 (H) 11.6 - 15.2 seconds   INR 1.35 0.00 - 1.49  APTT     Status: None   Collection Time: 10/26/15  5:20 AM  Result Value Ref Range   aPTT 37 24 - 37 seconds   Lactic acid, plasma     Status: None   Collection Time: 10/26/15  8:35 AM  Result Value Ref Range   Lactic Acid, Venous 1.0 0.5 - 2.0 mmol/L  Glucose, capillary     Status: Abnormal   Collection Time: 10/26/15  9:04 AM  Result Value Ref Range   Glucose-Capillary 166 (H) 65 - 99 mg/dL   Recent Results (from the past 240 hour(s))  MRSA PCR Screening     Status: None   Collection Time: 10/26/15  4:09 AM  Result Value Ref Range Status   MRSA by PCR NEGATIVE NEGATIVE Final    Comment:        The GeneXpert MRSA Assay (FDA approved for NASAL specimens only), is one component of a comprehensive MRSA colonization surveillance program. It is not intended to diagnose MRSA infection nor to guide or monitor treatment for MRSA infections.    Creatinine:  Recent Labs  10/25/15 2330 10/26/15 0520  CREATININE 2.17* 1.90*    Impression/Assessment/plan: Emphysematous cystitis-patient improved significantly with fluids and antibiotics. Urine is clear. His abdomen and suprapubic area soft and nontender. Would continue current medical therapy. PVR nl, so I don't feel he needs a foley.   I will notify Dr. Diona Fanti of admission. Please call with any questions or changes in pt status.   Kolbie Lepkowski 10/26/2015, 10:44 AM

## 2015-10-26 NOTE — Progress Notes (Signed)
Rechecked temp at 0630 - 101.1  RN administered 650 mg of tylenol.  Will pass on to next nurse to recheck in one hour.

## 2015-10-26 NOTE — Progress Notes (Addendum)
RN checked pts temp at 0600. Temp was 101.6.  Blankets were removed, and temperature turned down in the room - RN will recheck in 30 mins, if still elevated will give tylenol.

## 2015-10-26 NOTE — Progress Notes (Signed)
Initial Nutrition Assessment  DOCUMENTATION CODES:   Not applicable  INTERVENTION:    Glucerna Shake po BID, each supplement provides 220 kcal and 10 grams of protein  NUTRITION DIAGNOSIS:   Inadequate oral intake related to poor appetite as evidenced by per patient/family report  GOAL:   Patient will meet greater than or equal to 90% of their needs  MONITOR:   PO intake, Supplement acceptance, Labs, Weight trends, I & O's  REASON FOR ASSESSMENT:   Malnutrition Screening Tool  ASSESSMENT:   81 y.o. Male with a history of CAD, DM2, HTN and recent Lumbar surgery 10/02/2015 who presents to the ED with confusion. His wife is at the bedside and gives the history and reports that he has had complaints of lower ABD pain x 1-2 months, and this evening he had N+V x 1 and then became confused. He also had less urination over the past 2 days. He was evaluated in the ED and was found to have a fever to 101, and his lab studies revealed a leukocytosis at 17.9. A Sepsis workup was initiated, and a CT of the ABD/Pelvis was performed which revealed Emphysematous Cystitis  RD spoke with patient's wife at bedside.  Pt sleeping.  Wife reports patient with a decreased appetite recently.  Typically consumes 2 meals per day.  Reports pt has lost 10 lbs in the last week, however, wt readings do not reflect this.  Amenable to RD ordering oral nutrition supplements.  RD unable to complete Nutrition Focused Physical Exam at this time.  Diet Order:  Diet heart healthy/carb modified Room service appropriate?: Yes; Fluid consistency:: Thin  Skin:  Reviewed, no issues  Last BM:  11/4  Height:   Ht Readings from Last 1 Encounters:  10/26/15 5' 7.32" (1.71 m)    Weight:   Wt Readings from Last 1 Encounters:  10/25/15 200 lb (90.719 kg)    Wt Readings from Last 10 Encounters:  10/25/15 200 lb (90.719 kg)  02/11/15 196 lb (88.905 kg)  02/05/14 187 lb (84.823 kg)  08/31/13 184 lb 4.9 oz  (83.6 kg)  08/22/13 186 lb (84.369 kg)  01/03/13 180 lb (81.647 kg)     Ideal Body Weight:  67.2 kg  BMI:  Body mass index is 31.02 kg/(m^2).  Estimated Nutritional Needs:   Kcal:  1800-2000  Protein:  90-100 gm  Fluid:  1.8-2.0 L  EDUCATION NEEDS:   No education needs identified at this time  Arthur Holms, RD, LDN Pager #: [phone] After-Hours Pager #: [phone]

## 2015-10-27 LAB — BASIC METABOLIC PANEL
ANION GAP: 9 (ref 5–15)
BUN: 18 mg/dL (ref 6–20)
CALCIUM: 8.1 mg/dL — AB (ref 8.9–10.3)
CO2: 22 mmol/L (ref 22–32)
CREATININE: 1.75 mg/dL — AB (ref 0.61–1.24)
Chloride: 103 mmol/L (ref 101–111)
GFR calc Af Amer: 41 mL/min — ABNORMAL LOW (ref >60)
GFR, EST NON AFRICAN AMERICAN: 35 mL/min — AB (ref >60)
GLUCOSE: 103 mg/dL — AB (ref 65–99)
POTASSIUM: 4.2 mmol/L (ref 3.5–5.1)
SODIUM: 134 mmol/L — AB (ref 135–145)

## 2015-10-27 LAB — CBC
HCT: 31.6 % — ABNORMAL LOW (ref 39.0–52.0)
Hemoglobin: 10.3 g/dL — ABNORMAL LOW (ref 13.0–17.0)
MCH: 31.6 pg (ref 26.0–34.0)
MCHC: 32.6 g/dL (ref 30.0–36.0)
MCV: 96.9 fL (ref 78.0–100.0)
PLATELETS: 158 10*3/uL (ref 150–400)
RBC: 3.26 MIL/uL — AB (ref 4.22–5.81)
RDW: 15.7 % — ABNORMAL HIGH (ref 11.5–15.5)
WBC: 8 10*3/uL (ref 4.0–10.5)

## 2015-10-27 LAB — GLUCOSE, CAPILLARY: GLUCOSE-CAPILLARY: 77 mg/dL (ref 65–99)

## 2015-10-27 LAB — URINE CULTURE: Culture: 2000

## 2015-10-27 MED ORDER — VANCOMYCIN HCL 10 G IV SOLR: 1250.0000 mg | INTRAVENOUS | Status: DC

## 2015-10-27 MED ORDER — DEXTROSE 50 % IV SOLN
INTRAVENOUS | Status: AC
  Filled 2015-10-27: qty 50

## 2015-10-27 MED ORDER — ENOXAPARIN SODIUM 40 MG/0.4ML ~~LOC~~ SOLN
40.0000 mg | SUBCUTANEOUS | Status: DC
  Administered 2015-10-28 – 2015-10-31 (×4): 40 mg via SUBCUTANEOUS
  Filled 2015-10-27 (×4): qty 0.4

## 2015-10-27 MED ORDER — SODIUM CHLORIDE 0.9 % IV SOLN: INTRAVENOUS | Status: DC

## 2015-10-27 NOTE — Progress Notes (Signed)
Subjective: Patient reports no complaints. He still has some mild dysuria. He is voiding into a condom catheter and reports he feels when he needs to go and has no trouble voiding.  Objective: Vital signs in last 24 hours: Temp:  [98.1 F (36.7 C)-103 F (39.4 C)] 98.3 F (36.8 C) (11/06 0755) Pulse Rate:  [91-123] 91 (11/06 0755) Resp:  [17-30] 19 (11/06 0755) BP: (80-126)/(53-76) 97/64 mmHg (11/06 0755) SpO2:  [93 %-97 %] 97 % (11/06 0755)  Intake/Output from previous day: 11/05 0701 - 11/06 0700 In: 1530 [P.O.:480; I.V.:900; IV Piggyback:150] Out: 2000 [Urine:2000] Intake/Output this shift: Total I/O In: 240 [P.O.:240] Out: -   Physical Exam:  NAD Resting comfortably Abd - soft, NT, bladder NT and not distended.  Ext - no calf swelling or pain  GU - urine clear   Lab Results:  Recent Labs  10/25/15 2330 10/26/15 0520 10/27/15 0230  HGB 12.4* 10.4* 10.3*  HCT 37.0* 31.8* 31.6*   BMET  Recent Labs  10/26/15 0520 10/27/15 0230  NA 135 134*  K 4.8 4.2  CL 101 103  CO2 21* 22  GLUCOSE 192* 103*  BUN 26* 18  CREATININE 1.90* 1.75*  CALCIUM 8.0* 8.1*    Recent Labs  10/26/15 0520  INR 1.35   No results for input(s): LABURIN in the last 72 hours. Results for orders placed or performed during the hospital encounter of 10/25/15  Culture, blood (routine x 2)     Status: None (Preliminary result)   Collection Time: 10/25/15 11:40 PM  Result Value Ref Range Status   Specimen Description BLOOD RIGHT FOREARM  Final   Special Requests BOTTLES DRAWN AEROBIC AND ANAEROBIC 10CC  Final   Culture  Setup Time   Final    GRAM NEGATIVE RODS IN BOTH AEROBIC AND ANAEROBIC BOTTLES CRITICAL RESULT CALLED TO, READ BACK BY AND VERIFIED WITH: B RONCAOOO 10/26/15 @ 1145 M VESTAL    Culture GRAM NEGATIVE RODS  Final   Report Status PENDING  Incomplete  Culture, blood (routine x 2)     Status: None (Preliminary result)   Collection Time: 10/26/15 12:05 AM  Result Value  Ref Range Status   Specimen Description BLOOD LEFT ARM  Final   Special Requests BOTTLES DRAWN AEROBIC AND ANAEROBIC 5CC  Final   Culture  Setup Time   Final    GRAM NEGATIVE RODS IN BOTH AEROBIC AND ANAEROBIC BOTTLES CRITICAL RESULT CALLED TO, READ BACK BY AND VERIFIED WITH: B RONCAOOO 10/26/15 @ 1145 M VESTAL    Culture GRAM NEGATIVE RODS  Final   Report Status PENDING  Incomplete  MRSA PCR Screening     Status: None   Collection Time: 10/26/15  4:09 AM  Result Value Ref Range Status   MRSA by PCR NEGATIVE NEGATIVE Final    Comment:        The GeneXpert MRSA Assay (FDA approved for NASAL specimens only), is one component of a comprehensive MRSA colonization surveillance program. It is not intended to diagnose MRSA infection nor to guide or monitor treatment for MRSA infections.   Culture, blood (routine x 2)     Status: None (Preliminary result)   Collection Time: 10/26/15  6:15 PM  Result Value Ref Range Status   Specimen Description LEFT ANTECUBITAL  Final   Special Requests   Final    BOTTLES DRAWN AEROBIC AND ANAEROBIC 10CC PT ON VANC, AZACTAM   Culture PENDING  Incomplete   Report Status PENDING  Incomplete  Culture, blood (routine x 2)     Status: None (Preliminary result)   Collection Time: 10/26/15  6:25 PM  Result Value Ref Range Status   Specimen Description BLOOD LEFT WRIST  Final   Special Requests   Final    BOTTLES DRAWN AEROBIC ONLY 10CC PATIENT ON VANC,AZACTAM   Culture PENDING  Incomplete   Report Status PENDING  Incomplete    Studies/Results: Ct Abdomen Pelvis Wo Contrast  10/26/2015  CLINICAL DATA:  81 yr old male with high WBC, Abdominal pain, sepsis, and recent lumbar surgery. EXAM: CT ABDOMEN AND PELVIS WITHOUT CONTRAST TECHNIQUE: Multidetector CT imaging of the abdomen and pelvis was performed following the standard protocol without IV contrast. COMPARISON:  08/21/2015 FINDINGS: Lung bases: Minor subsegmental atelectasis. Otherwise clear. Heart  normal size. Liver: Diffuse fatty infiltration.  No mass or focal lesion. Gallbladder and biliary tree:  Unremarkable. Spleen:  Small calcifications otherwise unremarkable. Pancreas:  Unremarkable. Adrenal glands:  No masses. Kidneys, ureters, bladder: Bladder is thick-walled with air within the inner margin of the wall is well is nondependent air within the lumen of the gallbladder. Ureters are normal course and in caliber. Kidney show areas of scarring mostly on the left. There small nonobstructing stones in each kidney. Two small low-density lesions in the right kidney noted consistent with cysts. These findings in the kidneys are stable from the prior CT. Bladder findings are new. Lymph nodes:  No adenopathy. Ascites:  None. Vascular: Dense atherosclerotic calcifications along a normal caliber aorta its branch vessels. Prostate: Multiple radiation therapy seeds lie within the prostate and prostate bed, stable. Gastrointestinal: There multiple colonic diverticula mostly along the left colon. No diverticulitis. No bowel wall thickening or inflammatory changes. No evidence of obstruction. Normal small bowel and stomach. Musculoskeletal: Laminectomies have been performed at L4 from the recent lumbar spine surgery. There is a minimal amount of epidural air in this location as well as a small amount of air along the right laminectomy defect. There is no evidence a postoperative abscess. No osteoblastic or osteolytic lesions. Degenerative changes noted throughout the lumbar spine. Abdominal wall: Small midline fat containing hernias. No bowel enters these. IMPRESSION: 1. Emphysematous cystitis. Bladder wall is thickened with air along the mucosal margin of the wall. There is also nondependent air within the urinary bladder. 2. No other acute findings. No hydronephrosis. No evidence of a postoperative abscess from the recent lumbar spine surgery. No evidence of diverticulitis. 3. Chronic changes include hepatic  steatosis, left renal scarring and multiple colonic diverticula as well as degenerative changes throughout the visualized spine. Electronically Signed   By: Lajean Manes M.D.   On: 10/26/2015 01:18   Dg Chest Port 1 View  10/26/2015  CLINICAL DATA:  pt from home w/ c/o confusion and AMS, pt w/ recent hx of back surgery and taking vicodin for pain management. Pt has a hx of bypass x4 in 2007 and is taking medication for type 2 diabetes. EXAM: PORTABLE CHEST 1 VIEW COMPARISON:  06/10/2015 FINDINGS: Changes from CABG surgery, stable. Cardiac silhouette is normal in size and configuration. No mediastinal or hilar masses or evidence of adenopathy. Clear lungs.  No pleural effusion or pneumothorax. Bony thorax is demineralized but grossly intact. IMPRESSION: No acute cardiopulmonary disease. Electronically Signed   By: Lajean Manes M.D.   On: 10/26/2015 00:09    Assessment/Plan: Emphysematous cystitis - high temp, but WBC normalized and Cr returning to baseline. Blood Cx's growing GNR. Urine clear. Exam nl. No new GU  recs. Will follow.     LOS: 1 day   Okla Qazi 10/27/2015, 8:48 AM

## 2015-10-27 NOTE — Progress Notes (Signed)
MEDICATION RELATED CONSULT NOTE - FOLLOW UP   Pharmacy Consult for Vancomycin and Lovenox Indication: R/O sepsis and VTE px  Allergies  Allergen Reactions  . Adacel [Diphth-Acell Pertussis-Tetanus]   . Lipitor [Atorvastatin] Other (See Comments)    Muscle pain  . Penicillins     UNKNOWN  . Septra [Sulfamethoxazole-Trimethoprim]   . Simvastatin     MUSCLE PAIN    Patient Measurements: Height: 5' 7.32" (171 cm) Weight: 200 lb (90.719 kg) IBW/kg (Calculated) : 66.84 Adjusted Body Weight:   Vital Signs: Temp: 99.9 F (37.7 C) (11/06 1251) Temp Source: Oral (11/06 1251) BP: 121/66 mmHg (11/06 1251) Pulse Rate: 110 (11/06 1251) Intake/Output from previous day: 11/05 0701 - 11/06 0700 In: 1530 [P.O.:480; I.V.:900; IV Piggyback:150] Out: 2000 [Urine:2000] Intake/Output from this shift: Total I/O In: 980 [P.O.:480; I.V.:450; IV Piggyback:50] Out: -   Labs:  Recent Labs  10/25/15 2330 10/26/15 0520 10/27/15 0230  WBC 17.9* 13.2* 8.0  HGB 12.4* 10.4* 10.3*  HCT 37.0* 31.8* 31.6*  PLT 260 188 158  APTT  --  37  --   CREATININE 2.17* 1.90* 1.75*  ALBUMIN 2.7*  --   --   PROT 6.9  --   --   AST 26  --   --   ALT 17  --   --   ALKPHOS 85  --   --   BILITOT 1.1  --   --    Estimated Creatinine Clearance: 37 mL/min (by C-G formula based on Cr of 1.75).   Microbiology: Recent Results (from the past 720 hour(s))  Culture, blood (routine x 2)     Status: None (Preliminary result)   Collection Time: 10/25/15 11:40 PM  Result Value Ref Range Status   Specimen Description BLOOD RIGHT FOREARM  Final   Special Requests BOTTLES DRAWN AEROBIC AND ANAEROBIC 10CC  Final   Culture  Setup Time   Final    GRAM NEGATIVE RODS IN BOTH AEROBIC AND ANAEROBIC BOTTLES CRITICAL RESULT CALLED TO, READ BACK BY AND VERIFIED WITH: B RONCAOOO 10/26/15 @ 36 M [place]    Culture ESCHERICHIA COLI  Final   Report Status PENDING  Incomplete  Culture, blood (routine x 2)     Status: None  (Preliminary result)   Collection Time: 10/26/15 12:05 AM  Result Value Ref Range Status   Specimen Description BLOOD LEFT ARM  Final   Special Requests BOTTLES DRAWN AEROBIC AND ANAEROBIC 5CC  Final   Culture  Setup Time   Final    GRAM NEGATIVE RODS IN BOTH AEROBIC AND ANAEROBIC BOTTLES CRITICAL RESULT CALLED TO, READ BACK BY AND VERIFIED WITH: B RONCAOOO 10/26/15 @ 23 M [place]    Culture ESCHERICHIA COLI  Final   Report Status PENDING  Incomplete  Urine culture     Status: None   Collection Time: 10/26/15  1:15 AM  Result Value Ref Range Status   Specimen Description URINE, CLEAN CATCH  Final   Special Requests NONE  Final   Culture 2,000 COLONIES/mL INSIGNIFICANT GROWTH  Final   Report Status 10/27/2015 FINAL  Final  MRSA PCR Screening     Status: None   Collection Time: 10/26/15  4:09 AM  Result Value Ref Range Status   MRSA by PCR NEGATIVE NEGATIVE Final    Comment:        The GeneXpert MRSA Assay (FDA approved for NASAL specimens only), is one component of a comprehensive MRSA colonization surveillance program. It is not intended to diagnose  MRSA infection nor to guide or monitor treatment for MRSA infections.   Culture, blood (routine x 2)     Status: None (Preliminary result)   Collection Time: 10/26/15  6:15 PM  Result Value Ref Range Status   Specimen Description LEFT ANTECUBITAL  Final   Special Requests   Final    BOTTLES DRAWN AEROBIC AND ANAEROBIC 10CC PT ON VANC, AZACTAM   Culture NO GROWTH < 24 HOURS  Final   Report Status PENDING  Incomplete  Culture, blood (routine x 2)     Status: None (Preliminary result)   Collection Time: 10/26/15  6:25 PM  Result Value Ref Range Status   Specimen Description BLOOD LEFT WRIST  Final   Special Requests   Final    BOTTLES DRAWN AEROBIC ONLY 10CC PATIENT ON VANC,AZACTAM   Culture NO GROWTH < 24 HOURS  Final   Report Status PENDING  Incomplete    Assessment: 81yo male admitted with R/O sepsis.  Vancomcyin and  Lovenox currently adjusted for renal fxn, which has improved to CrCl ~72ml/min.   Aztreonam and Levofloxacin doses remain appropriate, Vanc and Lovenox require adjustment.  Plan:  Change Lovenox to 40mg  SQ q24, next dose 11/7 Change Vancomycin to 1250mg  IV q24, next dose at 2400 tonight Continue to follow renal fxn and adjust dosing as necessary  Gracy Bruins, [facility]

## 2015-10-27 NOTE — Progress Notes (Signed)
Pt continues to c/o abdominal discomfort with distention, one episode emesis at 3pm .

## 2015-10-27 NOTE — Progress Notes (Signed)
[facility] PROGRESS NOTE  [name] [id number] DOB: Aug 11, 1934 DOA: 10/25/2015  PCP: Mathews Argyle, MD  Brief HPI: 81 year old Caucasian male with a past medical history of coronary artery disease, diabetes, hypertension, who underwent lumbar surgery on October 12 presented to the ED with some confusion, vomiting and fever. He was found to have an abnormal UA with leukocytosis. CT scan revealed emphysematous cystitis. Patient was hospitalized for further management.  Past medical history:  Past Medical History  Diagnosis Date  . Hyperlipidemia   . Gout   . Glaucoma     "high normal"  . Diabetes mellitus without complication ([place])   . Myocardial infarction [facility])     "told me I had a small heart attack"  . GERD (gastroesophageal reflux disease)   . Cancer [facility]) 2010    prostate  . Arthritis   . H/O blood clots     R Lower leg, right upper leg  . Coronary artery disease   . Low back pain     "severe"  . Polymyalgia ([place])     h/o  . Osteoporosis   . Osteopenia   . Obesity   . Azotemia   . Thrombophlebitis   . Tinnitus   . Joint pain   . Heel spur   . Spinal stenosis   . Renal cyst, left   . Shingles     LEFT ABDOMEN  . Tubular adenoma   . Lesion of left native kidney   . Mild aortic stenosis   . Chondromalacia     Consultants: Urology. Phone discussion with neurosurgery.  Procedures: None  Antibiotics: Vancomycin 11/4 Aztreonam 11/4  Levaquin 11/4  Subjective: Patient had a rough night due to fever. Feels better this morning. Denies any abdominal or back pain currently. Able to urinate into the condom catheter. No nausea or vomiting. His wife is at bedside.  Objective: Vital Signs Filed Vitals:   10/27/15 0755  BP: 97/64  Pulse: 91  Temp: 98.3 F (36.8 C)  Resp: 19   Filed Vitals:   10/27/15 0400 10/27/15 0500 10/27/15 0600 10/27/15 0610  BP: 108/53  80/56 99/55  Pulse:      Temp:  98.7 F (37.1 C)    TempSrc:      Resp:  28  17 19   Height:      Weight:      SpO2:        Intake/Output Summary (Last 24 hours) at 10/27/15 0727 Last data filed at 10/27/15 0400  Gross per 24 hour  Intake   1530 ml  Output   1650 ml  Net   -120 ml   Filed Weights   10/25/15 2338  Weight: 90.719 kg (200 lb)    General appearance: alert, cooperative, appears stated age and no distress Resp: Diminished air entry at bases. No crackles, wheezing. No rhonchi. Cardio: S1, S2 , less tachycardic. Regular. No S3, S4. No rubs, bruit. Systolic murmur appreciated over the apex. GI: Abdomen soft. Nontender. Nondistended. Bowel sounds are present. No masses or organomegaly Back: Scar noted in the lower back. No erythema. No tenderness over that area. It is not warm to touch. Extremities: extremities normal, atraumatic, no cyanosis or edema Neurologic: Alert. Mildly distracted. Cranial nerves II-12 intact. No motor deficits appreciated. Able to lift both legs off the bed.  Lab Results:  Basic Metabolic Panel:  Recent Labs Lab 10/25/15 2330 10/26/15 0520 10/27/15 0230  NA 130* 135 134*  K 5.4* 4.8 4.2  CL  94* 101 103  CO2 20* 21* 22  GLUCOSE 350* 192* 103*  BUN 32* 26* 18  CREATININE 2.17* 1.90* 1.75*  CALCIUM 9.4 8.0* 8.1*   Liver Function Tests:  Recent Labs Lab 10/25/15 2330  AST 26  ALT 17  ALKPHOS 85  BILITOT 1.1  PROT 6.9  ALBUMIN 2.7*    Recent Labs Lab 10/25/15 2330  LIPASE 25   CBC:  Recent Labs Lab 10/25/15 2330 10/26/15 0520 10/27/15 0230  WBC 17.9* 13.2* 8.0  NEUTROABS 16.6*  --   --   HGB 12.4* 10.4* 10.3*  HCT 37.0* 31.8* 31.6*  MCV 95.6 97.0 96.9  PLT 260 188 158    CBG:  Recent Labs Lab 10/25/15 2325 10/26/15 0200 10/26/15 0904 10/26/15 1232 10/26/15 2109  GLUCAP 299* 216* 166* 190* 120*    Recent Results (from the past 240 hour(s))  Culture, blood (routine x 2)     Status: None (Preliminary result)   Collection Time: 10/25/15 11:40 PM  Result Value Ref Range Status    Specimen Description BLOOD RIGHT FOREARM  Final   Special Requests BOTTLES DRAWN AEROBIC AND ANAEROBIC 10CC  Final   Culture  Setup Time   Final    GRAM NEGATIVE RODS IN BOTH AEROBIC AND ANAEROBIC BOTTLES CRITICAL RESULT CALLED TO, READ BACK BY AND VERIFIED WITH: B RONCAOOO 10/26/15 @ [place]    Culture GRAM NEGATIVE RODS  Final   Report Status PENDING  Incomplete  Culture, blood (routine x 2)     Status: None (Preliminary result)   Collection Time: 10/26/15 12:05 AM  Result Value Ref Range Status   Specimen Description BLOOD LEFT ARM  Final   Special Requests BOTTLES DRAWN AEROBIC AND ANAEROBIC 5CC  Final   Culture  Setup Time   Final    GRAM NEGATIVE RODS IN BOTH AEROBIC AND ANAEROBIC BOTTLES CRITICAL RESULT CALLED TO, READ BACK BY AND VERIFIED WITH: B RONCAOOO 10/26/15 @ 52 M VESTAL    Culture GRAM NEGATIVE RODS  Final   Report Status PENDING  Incomplete  MRSA PCR Screening     Status: None   Collection Time: 10/26/15  4:09 AM  Result Value Ref Range Status   MRSA by PCR NEGATIVE NEGATIVE Final    Comment:        The GeneXpert MRSA Assay (FDA approved for NASAL specimens only), is one component of a comprehensive MRSA colonization surveillance program. It is not intended to diagnose MRSA infection nor to guide or monitor treatment for MRSA infections.   Culture, blood (routine x 2)     Status: None (Preliminary result)   Collection Time: 10/26/15  6:15 PM  Result Value Ref Range Status   Specimen Description LEFT ANTECUBITAL  Final   Special Requests   Final    BOTTLES DRAWN AEROBIC AND ANAEROBIC 10CC PT ON VANC, AZACTAM   Culture PENDING  Incomplete   Report Status PENDING  Incomplete  Culture, blood (routine x 2)     Status: None (Preliminary result)   Collection Time: 10/26/15  6:25 PM  Result Value Ref Range Status   Specimen Description BLOOD LEFT WRIST  Final   Special Requests   Final    BOTTLES DRAWN AEROBIC ONLY 10CC PATIENT ON VANC,AZACTAM    Culture PENDING  Incomplete   Report Status PENDING  Incomplete      Studies/Results: Ct Abdomen Pelvis Wo Contrast  10/26/2015  CLINICAL DATA:  81 yr old male with high WBC, Abdominal pain,  sepsis, and recent lumbar surgery. EXAM: CT ABDOMEN AND PELVIS WITHOUT CONTRAST TECHNIQUE: Multidetector CT imaging of the abdomen and pelvis was performed following the standard protocol without IV contrast. COMPARISON:  08/21/2015 FINDINGS: Lung bases: Minor subsegmental atelectasis. Otherwise clear. Heart normal size. Liver: Diffuse fatty infiltration.  No mass or focal lesion. Gallbladder and biliary tree:  Unremarkable. Spleen:  Small calcifications otherwise unremarkable. Pancreas:  Unremarkable. Adrenal glands:  No masses. Kidneys, ureters, bladder: Bladder is thick-walled with air within the inner margin of the wall is well is nondependent air within the lumen of the gallbladder. Ureters are normal course and in caliber. Kidney show areas of scarring mostly on the left. There small nonobstructing stones in each kidney. Two small low-density lesions in the right kidney noted consistent with cysts. These findings in the kidneys are stable from the prior CT. Bladder findings are new. Lymph nodes:  No adenopathy. Ascites:  None. Vascular: Dense atherosclerotic calcifications along a normal caliber aorta its branch vessels. Prostate: Multiple radiation therapy seeds lie within the prostate and prostate bed, stable. Gastrointestinal: There multiple colonic diverticula mostly along the left colon. No diverticulitis. No bowel wall thickening or inflammatory changes. No evidence of obstruction. Normal small bowel and stomach. Musculoskeletal: Laminectomies have been performed at L4 from the recent lumbar spine surgery. There is a minimal amount of epidural air in this location as well as a small amount of air along the right laminectomy defect. There is no evidence a postoperative abscess. No osteoblastic or osteolytic  lesions. Degenerative changes noted throughout the lumbar spine. Abdominal wall: Small midline fat containing hernias. No bowel enters these. IMPRESSION: 1. Emphysematous cystitis. Bladder wall is thickened with air along the mucosal margin of the wall. There is also nondependent air within the urinary bladder. 2. No other acute findings. No hydronephrosis. No evidence of a postoperative abscess from the recent lumbar spine surgery. No evidence of diverticulitis. 3. Chronic changes include hepatic steatosis, left renal scarring and multiple colonic diverticula as well as degenerative changes throughout the visualized spine. Electronically Signed   By: Lajean Manes M.D.   On: 10/26/2015 01:18   Dg Chest Port 1 View  10/26/2015  CLINICAL DATA:  pt from home w/ c/o confusion and AMS, pt w/ recent hx of back surgery and taking vicodin for pain management. Pt has a hx of bypass x4 in 2007 and is taking medication for type 2 diabetes. EXAM: PORTABLE CHEST 1 VIEW COMPARISON:  06/10/2015 FINDINGS: Changes from CABG surgery, stable. Cardiac silhouette is normal in size and configuration. No mediastinal or hilar masses or evidence of adenopathy. Clear lungs.  No pleural effusion or pneumothorax. Bony thorax is demineralized but grossly intact. IMPRESSION: No acute cardiopulmonary disease. Electronically Signed   By: Lajean Manes M.D.   On: 10/26/2015 00:09    Medications:  Scheduled: . allopurinol  300 mg Oral Daily  . aztreonam  1 g Intravenous Q8H  . enoxaparin (LOVENOX) injection  30 mg Subcutaneous Q24H  . feeding supplement (GLUCERNA SHAKE)  237 mL Oral BID BM  . Influenza vac split quadrivalent PF  0.5 mL Intramuscular Tomorrow-1000  . insulin aspart  0-5 Units Subcutaneous QHS  . insulin aspart  0-9 Units Subcutaneous TID WC  . [START ON 10/28/2015] levofloxacin (LEVAQUIN) IV  750 mg Intravenous Q48H  . rosuvastatin  5 mg Oral q1800  . tamsulosin  0.4 mg Oral Daily  . vancomycin  1,000 mg Intravenous  Q24H   Continuous: . sodium chloride 75 mL/hr  at 10/26/15 2220   [id number] **OR** acetaminophen, alum & mag hydroxide-simeth, HYDROmorphone (DILAUDID) injection, iohexol, iohexol, ondansetron **OR** ondansetron (ZOFRAN) IV, oxyCODONE  Assessment/Plan:  Principal Problem:   Sepsis ([place]) Active Problems:   Diabetes mellitus without complication ([facility])   CAD (coronary artery disease) of artery bypass graft   Essential hypertension   Emphysematous cystitis   Leukocytosis   Hyperkalemia   Hyponatremia   CKD (chronic kidney disease), stage III    Sepsis most likely secondary to emphysematous cystitis/Bacteremia Overall, patient remains stable. Continues to have high fevers, however. Lactic acid is improved. WBC is now normal. Blood cultures is growing gram-negative rods. Await final identification. Urine culture is pending. Urology is following closely. Continue current management for now. Continue IV fluids.   Back pain with recent lumbar surgery 10/12/Abnormal MRI 10/30 Patient underwent lumbar surgery on October 12. MRI report from October 30 revealed possibility of discitis and epidural abscess. Apparently patient saw Dr. Annette Stable in his office to discuss these findings. On 11/5 the case was discussed with Dr. Cyndy Freeze who was covering for Dr. Annette Stable. He reviewed the office notes as well as the MRI himself. He thinks that these changes are postoperative, as was felt by Dr. Annette Stable, and not suggestive of infection or an abscess. Patient does not have any neurological deficits. Plus he has another reason for his fever and sepsis. Continue analgesic agents and monitor closely.  Possible Acute on Chronic kidney disease stage III Creatinine has improved some. He continues to have good urine output. Continue IV fluids. Continue to monitor urine output.   Hyponatremia Improved with IV hydration. Potassium level is also normal. Continue to monitor closely.  Diabetes mellitus type 2 CBGs are  stable. Continue sliding scale coverage. HbA1c is pending.  History of coronary artery disease status post bypass Stable. Continue home medications. He is not noted to be on any antiplatelet agent or a beta blocker or ACE inhibitor based on his home medication list. Not noted on the cardiology office notes as well. He is followed by Dr. Tamala Julian.  History of essential hypertension Monitor blood pressures closely in the setting of sepsis. Her pressure is slightly low. But patient asymptomatic. Continue to monitor closely.   DVT Prophylaxis: Lovenox    Code Status: Full code  Family Communication: Discussed with the patient and his wife  Disposition Plan: Continue to remain in step down unit. Can start mobilizing.    LOS: 1 day   [facility] Pager [phone] 10/27/2015, 7:27 AM  If 7PM-7AM, please contact night-coverage at [email or web address], password [facility]

## 2015-10-27 NOTE — Progress Notes (Signed)
Pt was running a fever between 99-100; tylenol was given for temp max of 101.2 at 0300 and temp came down to 98.7. RN will continue to monitor. (ice packs were also applied)

## 2015-10-28 ENCOUNTER — Encounter (HOSPITAL_COMMUNITY): Payer: Self-pay | Admitting: *Deleted

## 2015-10-28 LAB — BASIC METABOLIC PANEL
ANION GAP: 8 (ref 5–15)
BUN: 19 mg/dL (ref 6–20)
CALCIUM: 8.2 mg/dL — AB (ref 8.9–10.3)
CO2: 25 mmol/L (ref 22–32)
CREATININE: 1.75 mg/dL — AB (ref 0.61–1.24)
Chloride: 101 mmol/L (ref 101–111)
GFR calc Af Amer: 41 mL/min — ABNORMAL LOW (ref >60)
GFR calc non Af Amer: 35 mL/min — ABNORMAL LOW (ref >60)
GLUCOSE: 139 mg/dL — AB (ref 65–99)
Potassium: 4.5 mmol/L (ref 3.5–5.1)
Sodium: 134 mmol/L — ABNORMAL LOW (ref 135–145)

## 2015-10-28 LAB — CULTURE, BLOOD (ROUTINE X 2)

## 2015-10-28 LAB — GLUCOSE, CAPILLARY
Glucose-Capillary: 183 mg/dL — ABNORMAL HIGH (ref 65–99)
Glucose-Capillary: 182 mg/dL — ABNORMAL HIGH (ref 65–99)
Glucose-Capillary: 158 mg/dL — ABNORMAL HIGH (ref 65–99)

## 2015-10-28 LAB — CBC
HEMATOCRIT: 29.4 % — AB (ref 39.0–52.0)
Hemoglobin: 9.5 g/dL — ABNORMAL LOW (ref 13.0–17.0)
MCH: 31.4 pg (ref 26.0–34.0)
MCHC: 32.3 g/dL (ref 30.0–36.0)
MCV: 97 fL (ref 78.0–100.0)
Platelets: 147 10*3/uL — ABNORMAL LOW (ref 150–400)
RBC: 3.03 MIL/uL — ABNORMAL LOW (ref 4.22–5.81)
RDW: 15.7 % — AB (ref 11.5–15.5)
WBC: 4.9 10*3/uL (ref 4.0–10.5)

## 2015-10-28 LAB — HEMOGLOBIN A1C
HEMOGLOBIN A1C: 8.1 % — AB (ref 4.8–5.6)
MEAN PLASMA GLUCOSE: 186 mg/dL

## 2015-10-28 NOTE — Progress Notes (Signed)
Pt transferred per bed with all belongings accompanied by nurse tech and RN  . Wife aware of transfer Pt oriented  to room given nurse Call light

## 2015-10-28 NOTE — Progress Notes (Signed)
Subjective: Patient reports that he is feeling better. Still a little weak, but no pain. He still has a Foley catheter in.  Objective: Vital signs in last 24 hours: Temp:  [98 F (36.7 C)-100.5 F (38.1 C)] 99 F (37.2 C) (11/07 1157) Pulse Rate:  [85-117] 100 (11/07 1157) Resp:  [15-26] 19 (11/07 1157) BP: (88-115)/(58-74) 108/64 mmHg (11/07 1157) SpO2:  [97 %-98 %] 98 % (11/07 1157)  Intake/Output from previous day: 11/06 0701 - 11/07 0700 In: 1462.3 [P.O.:840; I.V.:472.3; IV Piggyback:150] Out: 750 [Urine:750] Intake/Output this shift:    Physical Exam:  Constitutional: Vital signs reviewed. WD WN in NAD   Eyes: PERRL, No scleral icterus.   Cardiovascular: RRR Pulmonary/Chest: Normal effort    Lab Results:  Recent Labs  10/26/15 0520 10/27/15 0230 10/28/15 0312  HGB 10.4* 10.3* 9.5*  HCT 31.8* 31.6* 29.4*   BMET  Recent Labs  10/27/15 0230 10/28/15 0312  NA 134* 134*  K 4.2 4.5  CL 103 101  CO2 22 25  GLUCOSE 103* 139*  BUN 18 19  CREATININE 1.75* 1.75*  CALCIUM 8.1* 8.2*    Recent Labs  10/26/15 0520  INR 1.35   No results for input(s): LABURIN in the last 72 hours. Results for orders placed or performed during the hospital encounter of 10/25/15  Culture, blood (routine x 2)     Status: None   Collection Time: 10/25/15 11:40 PM  Result Value Ref Range Status   Specimen Description BLOOD RIGHT FOREARM  Final   Special Requests BOTTLES DRAWN AEROBIC AND ANAEROBIC 10CC  Final   Culture  Setup Time   Final    GRAM NEGATIVE RODS IN BOTH AEROBIC AND ANAEROBIC BOTTLES CRITICAL RESULT CALLED TO, READ BACK BY AND VERIFIED WITH: B RONCAOOO 10/26/15 @ 1145 M [place]ST[place]    Culture ESCHERICHIA COLI  Final   Report Status 10/28/2015 FINAL  Final   Organism ID, Bacteria ESCHERICHIA COLI  Final      Susceptibility   Escherichia coli - MIC*    AMPICILLIN <=2 SENSITIVE Sensitive     CEFAZOLIN <=4 SENSITIVE Sensitive     CEFEPIME <=1 SENSITIVE Sensitive      CEFTAZIDIME <=1 SENSITIVE Sensitive     CEFTRIAXONE <=1 SENSITIVE Sensitive     CIPROFLOXACIN <=0.25 SENSITIVE Sensitive     GENTAMICIN <=1 SENSITIVE Sensitive     IMIPENEM <=0.25 SENSITIVE Sensitive     TRIMETH/SULFA <=20 SENSITIVE Sensitive     AMPICILLIN/SULBACTAM <=2 SENSITIVE Sensitive     PIP/TAZO <=4 SENSITIVE Sensitive     * ESCHERICHIA COLI  Culture, blood (routine x 2)     Status: None   Collection Time: 10/26/15 12:05 AM  Result Value Ref Range Status   Specimen Description BLOOD LEFT ARM  Final   Special Requests BOTTLES DRAWN AEROBIC AND ANAEROBIC 5CC  Final   Culture  Setup Time   Final    GRAM NEGATIVE RODS IN BOTH AEROBIC AND ANAEROBIC BOTTLES CRITICAL RESULT CALLED TO, READ BACK BY AND VERIFIED WITH: B RONCAOOO 10/26/15 @ 1145 M VESTAL    Culture   Final    ESCHERICHIA COLI SUSCEPTIBILITIES PERFORMED ON PREVIOUS CULTURE WITHIN THE LAST 5 DAYS.    Report Status 10/28/2015 FINAL  Final  Urine culture     Status: None   Collection Time: 10/26/15  1:15 AM  Result Value Ref Range Status   Specimen Description URINE, CLEAN CATCH  Final   Special Requests NONE  Final   Culture 2,000  COLONIES/mL INSIGNIFICANT GROWTH  Final   Report Status 10/27/2015 FINAL  Final  MRSA PCR Screening     Status: None   Collection Time: 10/26/15  4:09 AM  Result Value Ref Range Status   MRSA by PCR NEGATIVE NEGATIVE Final    Comment:        The GeneXpert MRSA Assay (FDA approved for NASAL specimens only), is one component of a comprehensive MRSA colonization surveillance program. It is not intended to diagnose MRSA infection nor to guide or monitor treatment for MRSA infections.   Culture, blood (routine x 2)     Status: None (Preliminary result)   Collection Time: 10/26/15  6:15 PM  Result Value Ref Range Status   Specimen Description LEFT ANTECUBITAL  Final   Special Requests   Final    BOTTLES DRAWN AEROBIC AND ANAEROBIC 10CC PT ON VANC, AZACTAM   Culture NO GROWTH 2  DAYS  Final   Report Status PENDING  Incomplete  Culture, blood (routine x 2)     Status: None (Preliminary result)   Collection Time: 10/26/15  6:25 PM  Result Value Ref Range Status   Specimen Description BLOOD LEFT WRIST  Final   Special Requests   Final    BOTTLES DRAWN AEROBIC ONLY 10CC PATIENT ON VANC,AZACTAM   Culture NO GROWTH 2 DAYS  Final   Report Status PENDING  Incomplete    Studies/Results: No results found.  Assessment/Plan:   Emphysematous cystitis,  Urine culture negative, blood culture positive for pansensitive Escherichia coli. He is on appropriate IV antibiotic treatment at the present time.  I would recommend a full 10 days of antibiotic management-switch to oral when you're comfortable.  I would recommend leaving a Foley catheter in until the patient is ambulatory-at that point, it can be discontinued.continue Flomax. I would like to see him in follow-up in approximately one month to check urinalysis   LOS: 2 days   Franchot Gallo M 10/28/2015, 7:41 PM

## 2015-10-28 NOTE — Progress Notes (Addendum)
Called report to 2 [place] spoke to [place]  RN Pt will transfer per bed wife aware of move and room 2West bed 11

## 2015-10-28 NOTE — Progress Notes (Signed)
[facility] PROGRESS NOTE  DOMINQUE KRAVCHUK [id number] DOB: Aug 10, 1934 DOA: 10/25/2015  PCP: Mathews Argyle, MD  Brief HPI: 81 year old Caucasian male with a past medical history of coronary artery disease, diabetes, hypertension, who underwent lumbar surgery on October 12 presented to the ED with some confusion, vomiting and fever. He was found to have an abnormal UA with leukocytosis. CT scan revealed emphysematous cystitis. Patient was hospitalized for further management.  Past medical history:  Past Medical History  Diagnosis Date  . Hyperlipidemia   . Gout   . Glaucoma     "high normal"  . Diabetes mellitus without complication ([place])   . Myocardial infarction [facility])     "told me I had a small heart attack"  . GERD (gastroesophageal reflux disease)   . Cancer [facility]) 2010    prostate  . Arthritis   . H/O blood clots     R Lower leg, right upper leg  . Coronary artery disease   . Low back pain     "severe"  . Polymyalgia ([place])     h/o  . Osteoporosis   . Osteopenia   . Obesity   . Azotemia   . Thrombophlebitis   . Tinnitus   . Joint pain   . Heel spur   . Spinal stenosis   . Renal cyst, left   . Shingles     LEFT ABDOMEN  . Tubular adenoma   . Lesion of left native kidney   . Mild aortic stenosis   . Chondromalacia     Consultants: Urology. Phone discussion with neurosurgery, Dr. Cyndy Freeze.  Procedures: None  Antibiotics: Vancomycin 11/4--11/6 Aztreonam 11/4  Levaquin 11/4--11/6  Subjective: Patient had one episode of vomiting last evening. No further episodes. Denies any abdominal pain. No back pain. No headaches. Overall feels better. Able to urinate.   Objective: Vital Signs Filed Vitals:   10/28/15 0600  BP: 115/74  Pulse:   Temp: 98.3 F (36.8 C)  Resp: 18   Filed Vitals:   10/27/15 2000 10/28/15 0000 10/28/15 0418 10/28/15 0600  BP: 103/71 111/65 114/69 115/74  Pulse: 117 94 85   Temp: 100.5 F (38.1 C) 98.2 F (36.8 C) 98.9  F (37.2 C) 98.3 F (36.8 C)  TempSrc: Oral Oral Axillary Oral  Resp: 26 15 17 18   Height:      Weight:      SpO2:  98%      Intake/Output Summary (Last 24 hours) at 10/28/15 0725 Last data filed at 10/27/15 2100  Gross per 24 hour  Intake 1412.33 ml  Output    750 ml  Net 662.33 ml   Filed Weights   10/25/15 2338  Weight: 90.719 kg (200 lb)    General appearance: alert, cooperative, appears stated age and no distress Resp: No crackles, wheezing. No rhonchi. Reasonably good air entry bilaterally. Cardio: S1, S2 normal, regular. No S3, S4. No rubs, bruit. Systolic murmur appreciated over the apex. GI: Abdomen soft. Nontender. Nondistended. Bowel sounds are present. No masses or organomegaly Back: Scar in the lower back from recent incision. No erythema. No tenderness over that area. It is not warm to touch. Extremities: extremities normal, atraumatic, no cyanosis or edema Neurologic: Alert. Cranial nerves II-12 intact. No motor deficits appreciated. Able to lift both legs off the bed.  Lab Results:  Basic Metabolic Panel:  Recent Labs Lab 10/25/15 2330 10/26/15 0520 10/27/15 0230 10/28/15 0312  NA 130* 135 134* 134*  K 5.4* 4.8 4.2 4.5  CL 94* 101 103 101  CO2 20* 21* 22 25  GLUCOSE 350* 192* 103* 139*  BUN 32* 26* 18 19  CREATININE 2.17* 1.90* 1.75* 1.75*  CALCIUM 9.4 8.0* 8.1* 8.2*   Liver Function Tests:  Recent Labs Lab 10/25/15 2330  AST 26  ALT 17  ALKPHOS 85  BILITOT 1.1  PROT 6.9  ALBUMIN 2.7*    Recent Labs Lab 10/25/15 2330  LIPASE 25   CBC:  Recent Labs Lab 10/25/15 2330 10/26/15 0520 10/27/15 0230 10/28/15 0312  WBC 17.9* 13.2* 8.0 4.9  NEUTROABS 16.6*  --   --   --   HGB 12.4* 10.4* 10.3* 9.5*  HCT 37.0* 31.8* 31.6* 29.4*  MCV 95.6 97.0 96.9 97.0  PLT 260 188 158 147*    CBG:  Recent Labs Lab 10/26/15 0200 10/26/15 0904 10/26/15 1232 10/26/15 2109 10/27/15 0754  GLUCAP 216* 166* 190* 120* 77    Recent Results  (from the past 240 hour(s))  Culture, blood (routine x 2)     Status: None (Preliminary result)   Collection Time: 10/25/15 11:40 PM  Result Value Ref Range Status   Specimen Description BLOOD RIGHT FOREARM  Final   Special Requests BOTTLES DRAWN AEROBIC AND ANAEROBIC 10CC  Final   Culture  Setup Time   Final    GRAM NEGATIVE RODS IN BOTH AEROBIC AND ANAEROBIC BOTTLES CRITICAL RESULT CALLED TO, READ BACK BY AND VERIFIED WITH: B RONCAOOO 10/26/15 @ [id number] M VESTAL    Culture ESCHERICHIA COLI  Final   Report Status PENDING  Incomplete  Culture, blood (routine x 2)     Status: None (Preliminary result)   Collection Time: 10/26/15 12:05 AM  Result Value Ref Range Status   Specimen Description BLOOD LEFT ARM  Final   Special Requests BOTTLES DRAWN AEROBIC AND ANAEROBIC 5CC  Final   Culture  Setup Time   Final    GRAM NEGATIVE RODS IN BOTH AEROBIC AND ANAEROBIC BOTTLES CRITICAL RESULT CALLED TO, READ BACK BY AND VERIFIED WITH: B RONCAOOO 10/26/15 @ 1145 M VESTAL    Culture ESCHERICHIA COLI  Final   Report Status PENDING  Incomplete  Urine culture     Status: None   Collection Time: 10/26/15  1:15 AM  Result Value Ref Range Status   Specimen Description URINE, CLEAN CATCH  Final   Special Requests NONE  Final   Culture 2,000 COLONIES/mL INSIGNIFICANT GROWTH  Final   Report Status 10/27/2015 FINAL  Final  MRSA PCR Screening     Status: None   Collection Time: 10/26/15  4:09 AM  Result Value Ref Range Status   MRSA by PCR NEGATIVE NEGATIVE Final    Comment:        The GeneXpert MRSA Assay (FDA approved for NASAL specimens only), is one component of a comprehensive MRSA colonization surveillance program. It is not intended to diagnose MRSA infection nor to guide or monitor treatment for MRSA infections.   Culture, blood (routine x 2)     Status: None (Preliminary result)   Collection Time: 10/26/15  6:15 PM  Result Value Ref Range Status   Specimen Description LEFT ANTECUBITAL   Final   Special Requests   Final    BOTTLES DRAWN AEROBIC AND ANAEROBIC 10CC PT ON VANC, AZACTAM   Culture NO GROWTH < 24 HOURS  Final   Report Status PENDING  Incomplete  Culture, blood (routine x 2)     Status: None (Preliminary result)   Collection Time: 10/26/15  6:25 PM  Result Value Ref Range Status   Specimen Description BLOOD LEFT WRIST  Final   Special Requests   Final    BOTTLES DRAWN AEROBIC ONLY 10CC PATIENT ON VANC,AZACTAM   Culture NO GROWTH < 24 HOURS  Final   Report Status PENDING  Incomplete      Studies/Results: No results found.  Medications:  Scheduled: . allopurinol  300 mg Oral Daily  . aztreonam  1 g Intravenous Q8H  . dextrose      . enoxaparin (LOVENOX) injection  40 mg Subcutaneous Q24H  . feeding supplement (GLUCERNA SHAKE)  237 mL Oral BID BM  . Influenza vac split quadrivalent PF  0.5 mL Intramuscular Tomorrow-1000  . insulin aspart  0-5 Units Subcutaneous QHS  . insulin aspart  0-9 Units Subcutaneous TID WC  . rosuvastatin  5 mg Oral q1800  . tamsulosin  0.4 mg Oral Daily   Continuous:   [id number] **OR** acetaminophen, alum & mag hydroxide-simeth, HYDROmorphone (DILAUDID) injection, iohexol, iohexol, ondansetron **OR** ondansetron (ZOFRAN) IV, oxyCODONE  Assessment/Plan:  Principal Problem:   Sepsis ([place]) Active Problems:   Diabetes mellitus without complication (HCC)   CAD (coronary artery disease) of artery bypass graft   Essential hypertension   Emphysematous cystitis   Leukocytosis   Hyperkalemia   Hyponatremia   CKD (chronic kidney disease), stage III    Sepsis most likely secondary to emphysematous cystitis/Escherichia coli Bacteremia Overall, patient has improved. Fevers appears to have subsided. Lactic acid is improved. WBC is normal. Blood culture is growing Escherichia coli. Antibiotics deescalated. Await sensitivities. Urology is following. Saline lock IV fluids.   Back pain with recent lumbar surgery  10/12/Abnormal MRI 10/30 Patient underwent lumbar surgery on October 12. MRI report from October 30 revealed possibility of discitis and epidural abscess. Apparently patient saw Dr. Annette Stable in his office to discuss these findings. On 11/5 the case was discussed with Dr. Cyndy Freeze who was covering for Dr. Annette Stable. He reviewed the office notes as well as the MRI himself. He thinks that these changes are postoperative, as was felt by Dr. Annette Stable, and not suggestive of infection or an abscess. Patient does not have any neurological deficits. Plus he has another reason for his fever and sepsis. Continue analgesic agents and monitor closely.  Possible Acute on Chronic kidney disease stage III Creatinine has improved some. He continues to have good urine output. Continue to monitor urine output.   Hyponatremia Improved with IV hydration. Potassium level is also normal. Continue to monitor closely.  Diabetes mellitus type 2 CBGs are stable. Continue sliding scale coverage. HbA1c is pending.  History of coronary artery disease status post bypass Stable. Continue home medications. He is not noted to be on any antiplatelet agent or a beta blocker or ACE inhibitor based on his home medication list. Not noted on the cardiology office notes as well. He is followed by Dr. Tamala Julian.  History of essential hypertension Blood pressure remained stable. Continue to monitor closely.   DVT Prophylaxis: Lovenox    Code Status: Full code  Family Communication: Discussed with the patient and his wife  Disposition Plan: Okay for transfer to telemetry. Can start mobilizing. PT and OT evaluation.    LOS: 2 days   [place] [facility] Pager [phone] 10/28/2015, 7:25 AM  If 7PM-7AM, please contact night-coverage at [email or web address], password [facility]

## 2015-10-28 NOTE — Progress Notes (Signed)
Attempted report 

## 2015-10-29 LAB — CBC
HCT: 30.8 % — ABNORMAL LOW (ref 39.0–52.0)
HEMOGLOBIN: 10 g/dL — AB (ref 13.0–17.0)
MCH: 31.2 pg (ref 26.0–34.0)
MCHC: 32.5 g/dL (ref 30.0–36.0)
MCV: 96 fL (ref 78.0–100.0)
Platelets: 140 10*3/uL — ABNORMAL LOW (ref 150–400)
RBC: 3.21 MIL/uL — AB (ref 4.22–5.81)
RDW: 15.7 % — ABNORMAL HIGH (ref 11.5–15.5)
WBC: 5.5 10*3/uL (ref 4.0–10.5)

## 2015-10-29 LAB — GLUCOSE, CAPILLARY
GLUCOSE-CAPILLARY: 112 mg/dL — AB (ref 65–99)
GLUCOSE-CAPILLARY: 141 mg/dL — AB (ref 65–99)
GLUCOSE-CAPILLARY: 100 mg/dL — AB (ref 65–99)
Glucose-Capillary: 139 mg/dL — ABNORMAL HIGH (ref 65–99)
Glucose-Capillary: 140 mg/dL — ABNORMAL HIGH (ref 65–99)
Glucose-Capillary: 197 mg/dL — ABNORMAL HIGH (ref 65–99)
Glucose-Capillary: 139 mg/dL — ABNORMAL HIGH (ref 65–99)
Glucose-Capillary: 145 mg/dL — ABNORMAL HIGH (ref 65–99)

## 2015-10-29 LAB — BASIC METABOLIC PANEL
Anion gap: 7 (ref 5–15)
BUN: 18 mg/dL (ref 6–20)
CHLORIDE: 99 mmol/L — AB (ref 101–111)
CO2: 27 mmol/L (ref 22–32)
CREATININE: 1.77 mg/dL — AB (ref 0.61–1.24)
Calcium: 8.4 mg/dL — ABNORMAL LOW (ref 8.9–10.3)
GFR calc non Af Amer: 35 mL/min — ABNORMAL LOW (ref >60)
GFR, EST AFRICAN AMERICAN: 40 mL/min — AB (ref >60)
Glucose, Bld: 159 mg/dL — ABNORMAL HIGH (ref 65–99)
Potassium: 4.5 mmol/L (ref 3.5–5.1)
SODIUM: 133 mmol/L — AB (ref 135–145)

## 2015-10-29 MED ORDER — CIPROFLOXACIN IN D5W 400 MG/200ML IV SOLN
400.0000 mg | Freq: Two times a day (BID) | INTRAVENOUS | Status: DC
  Administered 2015-10-29 – 2015-10-31 (×5): 400 mg via INTRAVENOUS
  Filled 2015-10-29 (×5): qty 200

## 2015-10-29 NOTE — Progress Notes (Signed)
[facility] PROGRESS NOTE  [name] [id number] DOB: Feb 14, 1934 DOA: 10/25/2015  PCP: Mathews Argyle, MD  Brief HPI: 81 year old Caucasian male with a past medical history of coronary artery disease, diabetes, hypertension, who underwent lumbar surgery on October 12 presented to the ED with some confusion, vomiting and fever. He was found to have an abnormal UA with leukocytosis. CT scan revealed emphysematous cystitis. Patient was hospitalized for further management. Patient is slow to improve.  Past medical history:  Past Medical History  Diagnosis Date  . Hyperlipidemia   . Gout   . Glaucoma     "high normal"  . Diabetes mellitus without complication ([place])   . Myocardial infarction [facility])     "told me I had a small heart attack"  . GERD (gastroesophageal reflux disease)   . Cancer [facility]) 2010    prostate  . Arthritis   . H/O blood clots     R Lower leg, right upper leg  . Coronary artery disease   . Low back pain     "severe"  . Polymyalgia ([place])     h/o  . Osteoporosis   . Osteopenia   . Obesity   . Azotemia   . Thrombophlebitis   . Tinnitus   . Joint pain   . Heel spur   . Spinal stenosis   . Renal cyst, left   . Shingles     LEFT ABDOMEN  . Tubular adenoma   . Lesion of left native kidney   . Mild aortic stenosis   . Chondromalacia     Consultants: Urology. Phone discussion with neurosurgery, Dr. Cyndy Freeze.  Procedures: None  Antibiotics: Vancomycin 11/4--11/6 Aztreonam 11/4 --11/8 Levaquin 11/4--11/6 Intravenous ciprofloxacin 11/8   Subjective: Patient continues to be nauseated at times. Has a poor appetite as a result. Continues to have on and off lower back pain. Denies any abdominal pain. No headaches. Overall continues to feel better.   Objective: Vital Signs Filed Vitals:   10/29/15 0500  BP:   Pulse:   Temp: 99.4 F (37.4 C)  Resp:    Filed Vitals:   10/28/15 1157 10/28/15 2036 10/29/15 0414 10/29/15 0500  BP:  108/64 118/72 116/81   Pulse: 100 114 106   Temp: 99 F (37.2 C) 98.7 F (37.1 C) 100 F (37.8 C) 99.4 F (37.4 C)  TempSrc: Oral Oral Oral Oral  Resp: 19 18 18    Height:      Weight:      SpO2: 98% 97% 99%     Intake/Output Summary (Last 24 hours) at 10/29/15 0957 Last data filed at 10/29/15 0548  Gross per 24 hour  Intake    500 ml  Output   1900 ml  Net  -1400 ml   Filed Weights   10/25/15 2338  Weight: 90.719 kg (200 lb)    General appearance: alert, cooperative, appears stated age and no distress Resp: No crackles, wheezing. No rhonchi. Reasonably good air entry bilaterally. Cardio: S1, S2 normal, regular. No S3, S4. No rubs, bruit. Systolic murmur appreciated over the apex. GI: Abdomen soft. Nontender. Nondistended. Bowel sounds are present. No masses or organomegaly Back: Scar in the lower back from recent incision. No erythema. No tenderness over that area. It is not warm to touch. Neurologic: Alert. Cranial nerves II-12 intact. No motor deficits appreciated. Able to lift both legs off the bed.  Lab Results:  Basic Metabolic Panel:  Recent Labs Lab 10/25/15 2330 10/26/15 0520 10/27/15 0230 10/28/15 [id number]  10/29/15 0207  NA 130* 135 134* 134* 133*  K 5.4* 4.8 4.2 4.5 4.5  CL 94* 101 103 101 99*  CO2 20* 21* 22 25 27   GLUCOSE 350* 192* 103* 139* 159*  BUN 32* 26* 18 19 18   CREATININE 2.17* 1.90* 1.75* 1.75* 1.77*  CALCIUM 9.4 8.0* 8.1* 8.2* 8.4*   Liver Function Tests:  Recent Labs Lab 10/25/15 2330  AST 26  ALT 17  ALKPHOS 85  BILITOT 1.1  PROT 6.9  ALBUMIN 2.7*    Recent Labs Lab 10/25/15 2330  LIPASE 25   CBC:  Recent Labs Lab 10/25/15 2330 10/26/15 0520 10/27/15 0230 10/28/15 0312 10/29/15 0207  WBC 17.9* 13.2* 8.0 4.9 5.5  NEUTROABS 16.6*  --   --   --   --   HGB 12.4* 10.4* 10.3* 9.5* 10.0*  HCT 37.0* 31.8* 31.6* 29.4* 30.8*  MCV 95.6 97.0 96.9 97.0 96.0  PLT 260 188 158 147* 140*    CBG:  Recent Labs Lab  10/27/15 0754 10/28/15 1704 10/28/15 2034 10/28/15 2247 10/29/15 0638  GLUCAP 77 183* 182* 158* 140*    Recent Results (from the past 240 hour(s))  Culture, blood (routine x 2)     Status: None   Collection Time: 10/25/15 11:40 PM  Result Value Ref Range Status   Specimen Description BLOOD RIGHT FOREARM  Final   Special Requests BOTTLES DRAWN AEROBIC AND ANAEROBIC 10CC  Final   Culture  Setup Time   Final    GRAM NEGATIVE RODS IN BOTH AEROBIC AND ANAEROBIC BOTTLES CRITICAL RESULT CALLED TO, READ BACK BY AND VERIFIED WITH: B RONCAOOO 10/26/15 @ [place]  Final   Report Status 10/28/2015 FINAL  Final   Organism ID, Bacteria ESCHERICHIA COLI  Final      Susceptibility   Escherichia coli - MIC*    AMPICILLIN <=2 SENSITIVE Sensitive     CEFAZOLIN <=4 SENSITIVE Sensitive     CEFEPIME <=1 SENSITIVE Sensitive     CEFTAZIDIME <=1 SENSITIVE Sensitive     CEFTRIAXONE <=1 SENSITIVE Sensitive     CIPROFLOXACIN <=0.25 SENSITIVE Sensitive     GENTAMICIN <=1 SENSITIVE Sensitive     IMIPENEM <=0.25 SENSITIVE Sensitive     TRIMETH/SULFA <=20 SENSITIVE Sensitive     AMPICILLIN/SULBACTAM <=2 SENSITIVE Sensitive     PIP/TAZO <=4 SENSITIVE Sensitive     * ESCHERICHIA COLI  Culture, blood (routine x 2)     Status: None   Collection Time: 10/26/15 12:05 AM  Result Value Ref Range Status   Specimen Description BLOOD LEFT ARM  Final   Special Requests BOTTLES DRAWN AEROBIC AND ANAEROBIC 5CC  Final   Culture  Setup Time   Final    GRAM NEGATIVE RODS IN BOTH AEROBIC AND ANAEROBIC BOTTLES CRITICAL RESULT CALLED TO, READ BACK BY AND VERIFIED WITH: B RONCAOOO 10/26/15 @ 1145 M VESTAL    Culture   Final    ESCHERICHIA COLI SUSCEPTIBILITIES PERFORMED ON PREVIOUS CULTURE WITHIN THE LAST 5 DAYS.    Report Status 10/28/2015 FINAL  Final  Urine culture     Status: None   Collection Time: 10/26/15  1:15 AM  Result Value Ref Range Status   Specimen Description URINE,  CLEAN CATCH  Final   Special Requests NONE  Final   Culture 2,000 COLONIES/mL INSIGNIFICANT GROWTH  Final   Report Status 10/27/2015 FINAL  Final  MRSA PCR Screening     Status: None  Collection Time: 10/26/15  4:09 AM  Result Value Ref Range Status   MRSA by PCR NEGATIVE NEGATIVE Final    Comment:        The GeneXpert MRSA Assay (FDA approved for NASAL specimens only), is one component of a comprehensive MRSA colonization surveillance program. It is not intended to diagnose MRSA infection nor to guide or monitor treatment for MRSA infections.   Culture, blood (routine x 2)     Status: None (Preliminary result)   Collection Time: 10/26/15  6:15 PM  Result Value Ref Range Status   Specimen Description LEFT ANTECUBITAL  Final   Special Requests   Final    BOTTLES DRAWN AEROBIC AND ANAEROBIC 10CC PT ON VANC, AZACTAM   Culture NO GROWTH 2 DAYS  Final   Report Status PENDING  Incomplete  Culture, blood (routine x 2)     Status: None (Preliminary result)   Collection Time: 10/26/15  6:25 PM  Result Value Ref Range Status   Specimen Description BLOOD LEFT WRIST  Final   Special Requests   Final    BOTTLES DRAWN AEROBIC ONLY 10CC PATIENT ON VANC,AZACTAM   Culture NO GROWTH 2 DAYS  Final   Report Status PENDING  Incomplete      Studies/Results: No results found.  Medications:  Scheduled: . allopurinol  300 mg Oral Daily  . ciprofloxacin  400 mg Intravenous Q12H  . enoxaparin (LOVENOX) injection  40 mg Subcutaneous Q24H  . feeding supplement (GLUCERNA SHAKE)  237 mL Oral BID BM  . Influenza vac split quadrivalent PF  0.5 mL Intramuscular Tomorrow-1000  . insulin aspart  0-5 Units Subcutaneous QHS  . insulin aspart  0-9 Units Subcutaneous TID WC  . rosuvastatin  5 mg Oral q1800  . tamsulosin  0.4 mg Oral Daily   Continuous:   [id number] **OR** acetaminophen, alum & mag hydroxide-simeth, HYDROmorphone (DILAUDID) injection, ondansetron **OR** ondansetron (ZOFRAN) IV,  oxyCODONE  Assessment/Plan:  Principal Problem:   Sepsis ([place]) Active Problems:   Diabetes mellitus without complication ([facility])   CAD (coronary artery disease) of artery bypass graft   Essential hypertension   Emphysematous cystitis   Leukocytosis   Hyperkalemia   Hyponatremia   CKD (chronic kidney disease), stage III    Sepsis most likely secondary to emphysematous cystitis/Escherichia coli Bacteremia Overall, patient has improved. Fevers appears to have subsided. Lactic acid is improved. WBC is normal. Blood culture is growing Escherichia coli. Antibiotics deescalated. Continue intravenous antibiotics due to significant nausea. Change to oral in the next 24 hours. Urology is following. Saline lock IV fluids.   Back pain with recent lumbar surgery 10/12/Abnormal MRI 10/30 Patient underwent lumbar surgery on October 12. MRI report from October 30 revealed possibility of discitis and epidural abscess. Apparently patient saw Dr. Annette Stable in his office to discuss these findings. On 11/5 the case was discussed with Dr. Cyndy Freeze who was covering for Dr. Annette Stable. He reviewed the office notes as well as the MRI himself. He thinks that these changes are postoperative, as was felt by Dr. Annette Stable, and not suggestive of infection or an abscess. Patient does not have any neurological deficits. Plus he has another reason for his fever and sepsis. Continue analgesic agents and monitor closely. Will need follow-up with Dr. Annette Stable in the next 1-2 weeks.  Possible Acute on Chronic kidney disease stage III Creatinine has improved some and is now stable. He continues to have good urine output. Continue to monitor urine output.   Hyponatremia Improved with IV  hydration. Potassium level is also normal. Continue to monitor.  Diabetes mellitus type 2 CBGs are stable. Continue sliding scale coverage. HbA1c is 8.1.  History of coronary artery disease status post bypass Stable. Continue home medications. He is not noted to  be on any antiplatelet agent or a beta blocker or ACE inhibitor based on his home medication list. Not noted on the cardiology office notes as well. He is followed by Dr. Tamala Julian.  History of essential hypertension Blood pressure remained stable. Continue to monitor closely.   DVT Prophylaxis: Lovenox    Code Status: Full code  Family Communication: Discussed with the patient and his wife  Disposition Plan: Continue to mobilize. PT and OT has seen and he will need short-term rehabilitation at skilled nursing facility. Anticipate discharge in 1-2 days.    LOS: 3 days   [place] [facility] Pager [phone] 10/29/2015, 9:57 AM  If 7PM-7AM, please contact night-coverage at [email or web address], password [facility]

## 2015-10-29 NOTE — Progress Notes (Signed)
UR Completed. Leslie Langille, RN, BSN.  [phone] 

## 2015-10-29 NOTE — Evaluation (Addendum)
Occupational Therapy Evaluation Patient Details Name: [name] MRN: [id number] DOB: 1934-02-10 Today's Date: 10/29/2015    History of Present Illness 81 year old Caucasian male with a past medical history of coronary artery disease, diabetes, hypertension, who underwent lumbar surgery on October 12 presented to the ED with some confusion, vomiting and fever. He was found to have an abnormal UA with leukocytosis. CT scan revealed emphysematous cystitis.    Clinical Impression   Pt admitted with above. Pt receiving assist with ADLs, PTA. Feel pt will benefit from acute OT to increase independence and strength prior to d/c. Recommending SNF for rehab.    Follow Up Recommendations  SNF    Equipment Recommendations  Other (comment) (defer to next venue)    Recommendations for Other Services       Precautions / Restrictions Precautions Precautions: Back;Fall Precaution Booklet Issued: No Precaution Comments: educated on back precautions Restrictions Weight Bearing Restrictions: No      Mobility Bed Mobility Overal bed mobility: Needs Assistance Bed Mobility: Rolling;Sidelying to Sit;Sit to Sidelying Rolling: Min assist Sidelying to sit: Mod assist     Sit to sidelying: Min guard General bed mobility comments: assist with legs and trunk to come to sitting position. Cues for log roll technique.  Transfers                 General transfer comment: not assessed-pt in pain.    Balance    Balance not formally assessed.                                        ADL Overall ADL's : Needs assistance/impaired     Grooming: Set up;Supervision/safety;Bed level       Lower Body Bathing: Maximal assistance;Bed level       Lower Body Dressing: Maximal assistance;Bed level                 General ADL Comments: Briefly mentioned AE as pt unable to cross legs over knees today.     Vision     Perception     Praxis      Pertinent  Vitals/Pain Pain Assessment: 0-10 Pain Score:  (7-8) Pain Location: back Pain Descriptors / Indicators: Grimacing Pain Intervention(s): Monitored during session;Limited activity within patient's tolerance;Repositioned     Hand Dominance     Extremity/Trunk Assessment Upper Extremity Assessment Upper Extremity Assessment: Overall WFL for tasks assessed   Lower Extremity Assessment Lower Extremity Assessment: Defer to PT evaluation       Communication Communication Communication:  (reports he knows word he wants to say but can't get it out)   Cognition Arousal/Alertness: Awake/alert Behavior During Therapy: WFL for tasks assessed/performed Overall Cognitive Status: Impaired/Different from baseline Area of Impairment: Orientation;Problem solving Orientation Level: Disoriented to;Place           Problem Solving: Slow processing General Comments: increased time to state president's name   General Comments       Exercises       Shoulder Instructions      Home Living Family/patient expects to be discharged to:: Private residence Living Arrangements: Spouse/significant other Available Help at Discharge: Family;Available 24 hours/day Type of Home: House Home Access: Stairs to enter [employer] of Steps: 2 Entrance Stairs-Rails: None Home Layout: One level     Bathroom Shower/Tub: Walk-in shower;Door   Bathroom Toilet: Handicapped height     Home Equipment: Environmental consultant -  2 wheels;Cane - single point;Shower seat          Prior Functioning/Environment Level of Independence: Needs assistance  Gait / Transfers Assistance Needed: using cane and RW ADL's / Homemaking Assistance Needed: assist with dressing and setup assist with bathing        OT Diagnosis: Generalized weakness;Acute pain; Altered Mental Status   OT Problem List: Decreased cognition;Decreased knowledge of use of DME or AE;Decreased knowledge of precautions;Pain;Decreased activity  tolerance;Decreased strength;Decreased range of motion   OT Treatment/Interventions: Self-care/ADL training;DME and/or AE instruction;Therapeutic activities;Patient/family education;Balance training;Cognitive remediation/compensation    OT Goals(Current goals can be found in the care plan section) Acute Rehab OT Goals Patient Stated Goal: not stated OT Goal Formulation: With patient/family Time For Goal Achievement: 11/05/15 Potential to Achieve Goals: Good ADL Goals Pt Will Perform Grooming: with min guard assist;standing (3 tasks) Pt Will Perform Lower Body Bathing: with min assist;with adaptive equipment;sit to/from stand Pt Will Perform Lower Body Dressing: with min assist;with adaptive equipment;sit to/from stand Pt Will Transfer to Toilet: with min assist;ambulating Pt Will Perform Toileting - Clothing Manipulation and hygiene: with min assist;sit to/from stand  OT Frequency: Min 2X/week   Barriers to D/C:            Co-evaluation              End of Session    Activity Tolerance: Patient limited by pain Patient left: in bed;with call bell/phone within reach;with bed alarm set;with family/visitor present   Time: 0911-0928 OT Time Calculation (min): 17 min Charges:  OT General Charges $OT Visit: 1 Procedure OT Evaluation $Initial OT Evaluation Tier I: 1 Procedure G-CodesBenito Mccreedy OTR/L [id number] 10/29/2015, 9:40 AM

## 2015-10-29 NOTE — Evaluation (Addendum)
Physical Therapy Evaluation Patient Details Name: [name] MRN: [id number] DOB: 1934/10/31 Today's Date: 10/29/2015   History of Present Illness  81 year old Caucasian male with a past medical history of coronary artery disease, diabetes, hypertension, who underwent lumbar surgery on October 12 presented to the ED with some confusion, vomiting and fever. He was found to have an abnormal UA with leukocytosis. CT scan revealed emphysematous cystitis.   Clinical Impression  Patient presents with weakness most notable in BLEs and pain s/p recent back surgery impacting safe mobility. Required Min-Mod A for transfers and bed mobility today. Prior to back surgery in Oct, pt independent. Pt has 24/7 S from wife at home. Tolerated taking a few steps along side bed but limited due to weakness and pain. Reviewed back precautions. Would benefit from ST SNF to maximize independence and mobility prior to return home.     Follow Up Recommendations SNF    Equipment Recommendations  None recommended by PT    Recommendations for Other Services OT consult     Precautions / Restrictions Precautions Precautions: Back;Fall Precaution Booklet Issued: No Precaution Comments: Reviewed back precautions. Restrictions Weight Bearing Restrictions: No      Mobility  Bed Mobility Overal bed mobility: Needs Assistance Bed Mobility: Rolling;Sidelying to Sit;Sit to Sidelying Rolling: Min guard Sidelying to sit: Min assist     Sit to sidelying: Min assist General bed mobility comments: Cues for log roll technique. Use of rails for support. Min A to elevate trunk. Limited by pain.  Transfers Overall transfer level: Needs assistance Equipment used: Rolling walker (2 wheeled) Transfers: Sit to/from [facility] Sit to Stand: Min assist Stand pivot transfers: Min assist       General transfer comment: Min A to boost from EOB with cues for hand placement/technique. Knee instability noted  bilaterally. Manual cues for upright posture and hip extension. SPT bed to/from [facility] with Min A for balance. Mod A to stand from low BSC.  Ambulation/Gait Ambulation/Gait assistance: Min assist Ambulation Distance (Feet): 4 Feet Assistive device: Rolling walker (2 wheeled) Gait Pattern/deviations: Step-to pattern;Decreased stride length   Gait velocity interpretation: <1.8 ft/sec, indicative of risk for recurrent falls General Gait Details: Able to take a few steps along side bed to the right with Min A for balance.   Stairs            Wheelchair Mobility    Modified Rankin (Stroke Patients Only)       Balance Overall balance assessment: Needs assistance Sitting-balance support: Feet supported;Bilateral upper extremity supported Sitting balance-Leahy Scale: Fair     Standing balance support: During functional activity Standing balance-Leahy Scale: Poor Standing balance comment: Relient on RW for support and Min A due to knees trembling.                             Pertinent Vitals/Pain Pain Assessment: Faces Pain Score:  (7-8) Faces Pain Scale: Hurts whole lot Pain Location: back Pain Descriptors / Indicators: Grimacing;Sore Pain Intervention(s): Monitored during session;Repositioned;Premedicated before session    [place] expects to be discharged to:: Private residence Living Arrangements: Spouse/significant other Available Help at Discharge: Family;Available 24 hours/day Type of Home: House Home Access: Stairs to enter Entrance Stairs-Rails: None Entrance Stairs-Number of Steps: 2 Home Layout: One level Home Equipment: Walker - 2 wheels;Cane - single point;Shower seat      Prior Function Level of Independence: Needs assistance   Gait / Transfers Assistance  Needed: using cane and RW since surgery in Oct 2016.   ADL's / Homemaking Assistance Needed: assist with dressing and setup assist with bathing  Comments: Has been  sleeping in recliner since surgery.     Hand Dominance        Extremity/Trunk Assessment   Upper Extremity Assessment: Defer to OT evaluation           Lower Extremity Assessment: Generalized weakness         Communication   Communication: No difficulties  Cognition Arousal/Alertness: Awake/alert Behavior During Therapy: WFL for tasks assessed/performed Overall Cognitive Status: Within Functional Limits for tasks assessed Area of Impairment: Orientation;Problem solving Orientation Level: Disoriented to;Place           Problem Solving: Slow processing General Comments: increased time to state president's name    General Comments General comments (skin integrity, edema, etc.): Wife present during session.    Exercises        Assessment/Plan    PT Assessment Patient needs continued PT services  PT Diagnosis Difficulty walking;Generalized weakness;Acute pain   PT Problem List Decreased strength;Pain;Decreased activity tolerance;Decreased balance;Decreased mobility  PT Treatment Interventions Balance training;Gait training;Functional mobility training;Therapeutic activities;Therapeutic exercise;Patient/family education;Stair training   PT Goals (Current goals can be found in the Care Plan section) Acute Rehab PT Goals Patient Stated Goal: to get better PT Goal Formulation: With patient Time For Goal Achievement: 11/12/15 Potential to Achieve Goals: Fair    Frequency Min 3X/week   Barriers to discharge        Co-evaluation               End of Session Equipment Utilized During Treatment: Gait belt Activity Tolerance: Patient limited by pain Patient left: in bed;with call bell/phone within reach;with bed alarm set;with family/visitor present;with nursing/sitter in room Nurse Communication: Mobility status         Time: [id number] PT Time Calculation (min) (ACUTE ONLY): 31 min   Charges:   PT Evaluation $Initial PT Evaluation Tier I: 1  Procedure PT Treatments $Therapeutic Activity: 8-22 mins   PT G Codes:        Leianne Callins A Shatoya Roets 10/29/2015, 12:25 PM [place], [place], [place]

## 2015-10-29 NOTE — Care Management Important Message (Signed)
Important Message  Patient Details  Name: [name] MRN: [id number] Date of Birth: 1934-09-20   Medicare Important Message Given:  Yes-second notification given    Nathen May 10/29/2015, 10:18 AM

## 2015-10-30 DIAGNOSIS — N183 Chronic kidney disease, stage 3 (moderate): Secondary | ICD-10-CM

## 2015-10-30 DIAGNOSIS — R7881 Bacteremia: Secondary | ICD-10-CM

## 2015-10-30 DIAGNOSIS — N3080 Other cystitis without hematuria: Secondary | ICD-10-CM

## 2015-10-30 DIAGNOSIS — A4151 Sepsis due to Escherichia coli [E. coli]: Secondary | ICD-10-CM

## 2015-10-30 DIAGNOSIS — I10 Essential (primary) hypertension: Secondary | ICD-10-CM

## 2015-10-30 DIAGNOSIS — E119 Type 2 diabetes mellitus without complications: Secondary | ICD-10-CM

## 2015-10-30 LAB — CBC
HCT: 30.3 % — ABNORMAL LOW (ref 39.0–52.0)
Hemoglobin: 9.8 g/dL — ABNORMAL LOW (ref 13.0–17.0)
MCH: 30.8 pg (ref 26.0–34.0)
MCHC: 32.3 g/dL (ref 30.0–36.0)
MCV: 95.3 fL (ref 78.0–100.0)
Platelets: 161 10*3/uL (ref 150–400)
RBC: 3.18 MIL/uL — ABNORMAL LOW (ref 4.22–5.81)
RDW: 15.6 % — ABNORMAL HIGH (ref 11.5–15.5)
WBC: 7 10*3/uL (ref 4.0–10.5)

## 2015-10-30 LAB — GLUCOSE, CAPILLARY
GLUCOSE-CAPILLARY: 210 mg/dL — AB (ref 65–99)
Glucose-Capillary: 229 mg/dL — ABNORMAL HIGH (ref 65–99)
Glucose-Capillary: 137 mg/dL — ABNORMAL HIGH (ref 65–99)
Glucose-Capillary: 190 mg/dL — ABNORMAL HIGH (ref 65–99)

## 2015-10-30 LAB — BASIC METABOLIC PANEL
ANION GAP: 11 (ref 5–15)
BUN: 18 mg/dL (ref 6–20)
CHLORIDE: 95 mmol/L — AB (ref 101–111)
CO2: 26 mmol/L (ref 22–32)
Calcium: 8.5 mg/dL — ABNORMAL LOW (ref 8.9–10.3)
Creatinine, Ser: 1.65 mg/dL — ABNORMAL HIGH (ref 0.61–1.24)
GFR calc Af Amer: 44 mL/min — ABNORMAL LOW (ref >60)
GFR calc non Af Amer: 38 mL/min — ABNORMAL LOW (ref >60)
Glucose, Bld: 230 mg/dL — ABNORMAL HIGH (ref 65–99)
POTASSIUM: 3.9 mmol/L (ref 3.5–5.1)
SODIUM: 132 mmol/L — AB (ref 135–145)

## 2015-10-30 MED ORDER — OXYCODONE HCL 5 MG PO TABS
5.0000 mg | ORAL_TABLET | Freq: Once | ORAL | Status: AC
  Administered 2015-10-30: 5 mg via ORAL
  Filled 2015-10-30: qty 1

## 2015-10-30 NOTE — Progress Notes (Signed)
Patient was able to transfer to the chair with moderate assistance.  He complained of severe back and right hip pain during the transfer.  He was medicated with Dilaudid 0.5mg  and Oxycodone 5 mg ~40 minutes later with pain relief enabling him to sit in the chair for two hours.  Will continue to assist patient to increase his mobility.

## 2015-10-30 NOTE — Clinical Social Work Note (Signed)
Clinical Social Work Assessment  Patient Details  Name: [name] MRN: [id number] Date of Birth: Dec 09, 1934  Date of referral:  10/30/15               Reason for consult:  Facility Placement                Housing/Transportation Living arrangements for the past 2 months:  Single Family Home Source of Information:  Patient Patient Interpreter Needed:  None Criminal Activity/Legal Involvement Pertinent to Current Situation/Hospitalization:  No - Comment as needed Significant Relationships:  None Lives with:  Spouse Do you feel safe going back to the place where you live?  No Need for family participation in patient care:  Yes (Comment)  Care giving concerns:  N/A   Facilities manager / plan:  CSW met with the pt at the bedside. CSW introduced self and purpose of the visit. CSW discussed SNF rehab. CSW explained insurance and its relation to SNF placement.  CSW provided the pt with a SNF list. The pt reported that he needs to talk to his wife. CSW answered all questions in which the pt inquired about. CSW will continue to follow this pt and assist with discharge as needed.   Employment status:  Retired Nurse, adult PT Recommendations:  [place] / Referral to community resources:  [place]  Patient/Family's Response to care:  The pt reported that the medical staff has cared for him well.   Patient/Family's Understanding of and Emotional Response to Diagnosis, Current Treatment, and Prognosis: The pt acknowledged his diagnosis and current treatment. The pt acknowledged that he will need rehab, but unsure which place (SNF or home) is best for him.  Emotional Assessment Appearance:  Appears stated age Attitude/Demeanor/Rapport:   (Calm ) Affect (typically observed):  Accepting Orientation:  Oriented to Self, Oriented to Place, Oriented to  Time, Oriented to Situation Alcohol / Substance use:  Not  Applicable Psych involvement (Current and /or in the community):  No (Comment)  Discharge Needs  Concerns to be addressed:  Denies Needs/Concerns at this time Readmission within the last 30 days:  No Current discharge risk:  None Barriers to Discharge:  No Barriers Identified   Jessyka Austria, LCSW 10/30/2015, 2:06 PM

## 2015-10-30 NOTE — NC FL2 (Signed)
[place] LEVEL OF CARE SCREENING TOOL     IDENTIFICATION  Patient Name: [name] Birthdate: [birth year] Sex: male Admission Date (Current Location): 10/25/2015  [facility] and [place] Number:     Facility and Address:  [facility], [place] [address] [place], [place] [address]      Provider Number: [id number]  Attending Physician Name and Address:  Albertine Patricia, MD  Relative Name and Phone Number:       Current Level of Care: Hospital Recommended Level of Care: [place] Prior Approval Number:    Date Approved/Denied:   PASRR Number: [id number]   [facility] [phone])  Discharge Plan: SNF    Current Diagnoses: Patient Active Problem List   Diagnosis Date Noted  . 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 Sepsis ([place]) 10/26/2015  . Leukocytosis 10/26/2015  . Hyperkalemia 10/26/2015  . Hyponatremia 10/26/2015  . CKD (chronic kidney disease), stage III 10/26/2015  . Essential hypertension 02/11/2015  . Hyperlipidemia   . Diabetes mellitus without complication ([place])   . Myocardial infarction ([place])   . GERD (gastroesophageal reflux disease)   . Cancer ([place])   . Arthritis   . H/O blood clots   . CAD (coronary artery disease) of artery bypass graft   . Low back pain   . Polymyalgia ([place])   . Gout   . Glaucoma     Orientation ACTIVITIES/SOCIAL BLADDER RESPIRATION    Self, Time, Situation, Place  Family supportive, Active Continent Normal  BEHAVIORAL SYMPTOMS/MOOD NEUROLOGICAL BOWEL NUTRITION STATUS   (N/A)   Continent Diet (Soft )  PHYSICIAN VISITS COMMUNICATION OF NEEDS Height & Weight Skin    Verbally 5\' 7"  (170.2 cm) 200 lbs. Normal          AMBULATORY STATUS RESPIRATION     (Limit Assist ) Normal      Personal Care Assistance Level of Assistance  Dressing Bathing Assistance: Maximum assistance   Dressing Assistance: Maximum assistance      Functional Limitations Info   (None )              SPECIAL CARE FACTORS FREQUENCY  PT (By licensed PT), OT (By licensed OT)                   Additional Factors Info  Code Status, Allergies Code Status Info: Full  Allergies Info: Adacel, Lipitor, Penicillins, Septra, Simvastatin           Current Medications (10/30/2015): Current Facility-Administered Medications  Medication Dose Route Frequency Provider Last Rate Last Dose  . acetaminophen (TYLENOL) tablet 650 mg  650 mg Oral Q6H PRN Theressa Millard, MD   650 mg at 10/29/15 2230   Or  . acetaminophen (TYLENOL) suppository 650 mg  650 mg Rectal Q6H PRN Theressa Millard, MD   650 mg at 10/26/15 1712  . allopurinol (ZYLOPRIM) tablet 300 mg  300 mg Oral Daily Theressa Millard, MD   300 mg at 10/30/15 0956  . alum & mag hydroxide-simeth (MAALOX/MYLANTA) 200-200-20 MG/5ML suspension 30 mL  30 mL Oral Q6H PRN Theressa Millard, MD      . ciprofloxacin (CIPRO) IVPB 400 mg  400 mg Intravenous Q12H Bonnielee Haff, MD   400 mg at 10/30/15 0959  . enoxaparin (LOVENOX) injection 40 mg  40 mg Subcutaneous Q24H Gaynell Face Hiatt, RPH   40 mg at 10/30/15 0653  . feeding supplement (GLUCERNA SHAKE) (GLUCERNA SHAKE) liquid 237 mL  237 mL Oral  BID BM Rogue Bussing, RD   237 mL at 10/30/15 1000  . HYDROmorphone (DILAUDID) injection 0.5-1 mg  0.5-1 mg Intravenous Q3H PRN Theressa Millard, MD   1 mg at 10/26/15 1104  . Influenza vac split quadrivalent PF (FLUARIX) injection 0.5 mL  0.5 mL Intramuscular Tomorrow-1000 Bonnielee Haff, MD      . insulin aspart (novoLOG) injection 0-5 Units  0-5 Units Subcutaneous QHS Theressa Millard, MD   0 Units at 10/26/15 2200  . insulin aspart (novoLOG) injection 0-9 Units  0-9 Units Subcutaneous TID WC Theressa Millard, MD   3 Units at 10/30/15 [phone]  . ondansetron (ZOFRAN) tablet 4 mg  4 mg Oral Q6H PRN Theressa Millard, MD   4 mg at 10/29/15 0959   Or  . ondansetron (ZOFRAN) injection 4 mg  4 mg Intravenous Q6H PRN Theressa Millard, MD    4 mg at 10/27/15 1539  . oxyCODONE (Oxy IR/ROXICODONE) immediate release tablet 5 mg  5 mg Oral Q4H PRN Theressa Millard, MD   5 mg at 10/30/15 0840  . rosuvastatin (CRESTOR) tablet 5 mg  5 mg Oral q1800 Theressa Millard, MD   5 mg at 10/29/15 1751  . tamsulosin (FLOMAX) capsule 0.4 mg  0.4 mg Oral Daily Theressa Millard, MD   0.4 mg at 10/30/15 [id number]   Do not use this list as official medication orders. Please verify with discharge summary.  Discharge Medications:   Medication List    ASK your doctor about these medications        acetaminophen 325 MG tablet  Commonly known as:  TYLENOL  Take 1 tablet (325 mg total) by mouth every 6 (six) hours as needed for pain (1-2 tablets q 6 hrs prn pain).     alendronate 70 MG tablet  Commonly known as:  FOSAMAX  Take 70 mg by mouth daily. Take with a full glass of water on an empty stomach. Patient takes this medication once weekly on Saturday.     allopurinol 300 MG tablet  Commonly known as:  ZYLOPRIM  Take 300 mg by mouth daily.     glipiZIDE 10 MG tablet  Commonly known as:  GLUCOTROL  Take 10 mg by mouth 2 (two) times daily before a meal.     HYDROcodone-acetaminophen 10-325 MG tablet  Commonly known as:  NORCO  Take 1.5 tablets by mouth every 6 (six) hours as needed.     rosuvastatin 5 MG tablet  Commonly known as:  CRESTOR  Take 5 mg by mouth daily.     tamsulosin 0.4 MG Caps capsule  Commonly known as:  FLOMAX  Take 0.4 mg by mouth daily.        Relevant Imaging Results:  Relevant Lab Results:  Recent Labs    Additional Information    Ishanvi Mcquitty, LCSW

## 2015-10-30 NOTE — Progress Notes (Signed)
[facility] PROGRESS NOTE  [name] [id number] DOB: October 17, 1934 DOA: 10/25/2015  PCP: Mathews Argyle, MD  Brief HPI: 81 year old Caucasian male with a past medical history of coronary artery disease, diabetes, hypertension, who underwent lumbar surgery on October 12 presented to the ED with some confusion, vomiting and fever. He was found to have an abnormal UA with leukocytosis. CT scan revealed emphysematous cystitis. Patient was hospitalized for further management. Patient is slow to improve.  Past medical history:  Past Medical History  Diagnosis Date  . Hyperlipidemia   . Gout   . Glaucoma     "high normal"  . Diabetes mellitus without complication ([place])   . Myocardial infarction [facility])     "told me I had a small heart attack"  . GERD (gastroesophageal reflux disease)   . Cancer [facility]) 2010    prostate  . Arthritis   . H/O blood clots     R Lower leg, right upper leg  . Coronary artery disease   . Low back pain     "severe"  . Polymyalgia ([place])     h/o  . Osteoporosis   . Osteopenia   . Obesity   . Azotemia   . Thrombophlebitis   . Tinnitus   . Joint pain   . Heel spur   . Spinal stenosis   . Renal cyst, left   . Shingles     LEFT ABDOMEN  . Tubular adenoma   . Lesion of left native kidney   . Mild aortic stenosis   . Chondromalacia     Consultants: Urology. Phone discussion with neurosurgery, Dr. Cyndy Freeze.  Procedures: None  Antibiotics: Vancomycin 11/4--11/6 Aztreonam 11/4 --11/8 Levaquin 11/4--11/6 Intravenous ciprofloxacin 11/8   Subjective: Patient with less nausea, appetite is improving. Continues to have on and off lower back pain. Denies any abdominal pain. No headaches. Overall continues to feel better.   Objective: Vital Signs Filed Vitals:   10/30/15 0433  BP: 106/61  Pulse: 95  Temp: 98.1 F (36.7 C)  Resp: 18   Filed Vitals:   10/29/15 1335 10/29/15 2053 10/29/15 2225 10/30/15 0433  BP: 108/61 112/59  106/61    Pulse: 105 106  95  Temp: 98.9 F (37.2 C) 100.7 F (38.2 C) 99.8 F (37.7 C) 98.1 F (36.7 C)  TempSrc: Oral Oral Oral Oral  Resp: 17 18  18   Height:      Weight:      SpO2: 96% 97%  91%    Intake/Output Summary (Last 24 hours) at 10/30/15 1102 Last data filed at 10/30/15 0959  Gross per 24 hour  Intake    777 ml  Output   1431 ml  Net   -654 ml   Filed Weights   10/25/15 2338  Weight: 90.719 kg (200 lb)    General appearance: alert, cooperative, appears stated age and no distress Resp: No crackles, wheezing. No rhonchi. Reasonably good air entry bilaterally. Cardio: S1, S2 normal, regular. No S3, S4. No rubs, bruit. Systolic murmur appreciated over the apex. GI: Abdomen soft. Nontender. Nondistended. Bowel sounds are present. No masses or organomegaly Back: Scar in the lower back from recent incision. No erythema. No tenderness over that area. It is not warm to touch. Neurologic: Alert. Cranial nerves II-12 intact. No motor deficits appreciated. Able to lift both legs off the bed.  Lab Results:  Basic Metabolic Panel:  Recent Labs Lab 10/26/15 0520 10/27/15 0230 10/28/15 0312 10/29/15 0207 10/30/15 0317  NA 135  134* 134* 133* 132*  K 4.8 4.2 4.5 4.5 3.9  CL 101 103 101 99* 95*  CO2 21* 22 25 27 26   GLUCOSE 192* 103* 139* 159* 230*  BUN 26* 18 19 18 18   CREATININE 1.90* 1.75* 1.75* 1.77* 1.65*  CALCIUM 8.0* 8.1* 8.2* 8.4* 8.5*   Liver Function Tests:  Recent Labs Lab 10/25/15 2330  AST 26  ALT 17  ALKPHOS 85  BILITOT 1.1  PROT 6.9  ALBUMIN 2.7*    Recent Labs Lab 10/25/15 2330  LIPASE 25   CBC:  Recent Labs Lab 10/25/15 2330 10/26/15 0520 10/27/15 0230 10/28/15 0312 10/29/15 0207 10/30/15 0317  WBC 17.9* 13.2* 8.0 4.9 5.5 7.0  NEUTROABS 16.6*  --   --   --   --   --   HGB 12.4* 10.4* 10.3* 9.5* 10.0* 9.8*  HCT 37.0* 31.8* 31.6* 29.4* 30.8* 30.3*  MCV 95.6 97.0 96.9 97.0 96.0 95.3  PLT 260 188 158 147* 140* 161     CBG:  Recent Labs Lab 10/29/15 0638 10/29/15 1115 10/29/15 1639 10/29/15 2049 10/30/15 0621  GLUCAP 140* 197* 139* 145* 229*    Recent Results (from the past 240 hour(s))  Culture, blood (routine x 2)     Status: None   Collection Time: 10/25/15 11:40 PM  Result Value Ref Range Status   Specimen Description BLOOD RIGHT FOREARM  Final   Special Requests BOTTLES DRAWN AEROBIC AND ANAEROBIC 10CC  Final   Culture  Setup Time   Final    GRAM NEGATIVE RODS IN BOTH AEROBIC AND ANAEROBIC BOTTLES CRITICAL RESULT CALLED TO, READ BACK BY AND VERIFIED WITH: B RONCAOOO 10/26/15 @ [place]  Final   Report Status 10/28/2015 FINAL  Final   Organism ID, Bacteria ESCHERICHIA COLI  Final      Susceptibility   Escherichia coli - MIC*    AMPICILLIN <=2 SENSITIVE Sensitive     CEFAZOLIN <=4 SENSITIVE Sensitive     CEFEPIME <=1 SENSITIVE Sensitive     CEFTAZIDIME <=1 SENSITIVE Sensitive     CEFTRIAXONE <=1 SENSITIVE Sensitive     CIPROFLOXACIN <=0.25 SENSITIVE Sensitive     GENTAMICIN <=1 SENSITIVE Sensitive     IMIPENEM <=0.25 SENSITIVE Sensitive     TRIMETH/SULFA <=20 SENSITIVE Sensitive     AMPICILLIN/SULBACTAM <=2 SENSITIVE Sensitive     PIP/TAZO <=4 SENSITIVE Sensitive     * ESCHERICHIA COLI  Culture, blood (routine x 2)     Status: None   Collection Time: 10/26/15 12:05 AM  Result Value Ref Range Status   Specimen Description BLOOD LEFT ARM  Final   Special Requests BOTTLES DRAWN AEROBIC AND ANAEROBIC 5CC  Final   Culture  Setup Time   Final    GRAM NEGATIVE RODS IN BOTH AEROBIC AND ANAEROBIC BOTTLES CRITICAL RESULT CALLED TO, READ BACK BY AND VERIFIED WITH: B RONCAOOO 10/26/15 @ 1145 M [place]ST[place]    Culture   Final    ESCHERICHIA COLI SUSCEPTIBILITIES PERFORMED ON PREVIOUS CULTURE WITHIN THE LAST 5 DAYS.    Report Status 10/28/2015 FINAL  Final  Urine culture     Status: None   Collection Time: 10/26/15  1:15 AM  Result Value Ref Range Status    Specimen Description URINE, CLEAN CATCH  Final   Special Requests NONE  Final   Culture 2,000 COLONIES/mL INSIGNIFICANT GROWTH  Final   Report Status 10/27/2015 FINAL  Final  MRSA PCR Screening  Status: None   Collection Time: 10/26/15  4:09 AM  Result Value Ref Range Status   MRSA by PCR NEGATIVE NEGATIVE Final    Comment:        The GeneXpert MRSA Assay (FDA approved for NASAL specimens only), is one component of a comprehensive MRSA colonization surveillance program. It is not intended to diagnose MRSA infection nor to guide or monitor treatment for MRSA infections.   Culture, blood (routine x 2)     Status: None (Preliminary result)   Collection Time: 10/26/15  6:15 PM  Result Value Ref Range Status   Specimen Description LEFT ANTECUBITAL  Final   Special Requests   Final    BOTTLES DRAWN AEROBIC AND ANAEROBIC 10CC PT ON VANC, AZACTAM   Culture NO GROWTH 3 DAYS  Final   Report Status PENDING  Incomplete  Culture, blood (routine x 2)     Status: None (Preliminary result)   Collection Time: 10/26/15  6:25 PM  Result Value Ref Range Status   Specimen Description BLOOD LEFT WRIST  Final   Special Requests   Final    BOTTLES DRAWN AEROBIC ONLY 10CC PATIENT ON VANC,AZACTAM   Culture NO GROWTH 3 DAYS  Final   Report Status PENDING  Incomplete      Studies/Results: No results found.  Medications:  Scheduled: . allopurinol  300 mg Oral Daily  . ciprofloxacin  400 mg Intravenous Q12H  . enoxaparin (LOVENOX) injection  40 mg Subcutaneous Q24H  . feeding supplement (GLUCERNA SHAKE)  237 mL Oral BID BM  . Influenza vac split quadrivalent PF  0.5 mL Intramuscular Tomorrow-1000  . insulin aspart  0-5 Units Subcutaneous QHS  . insulin aspart  0-9 Units Subcutaneous TID WC  . rosuvastatin  5 mg Oral q1800  . tamsulosin  0.4 mg Oral Daily   Continuous:   [id number] **OR** acetaminophen, alum & mag hydroxide-simeth, HYDROmorphone (DILAUDID) injection, ondansetron  **OR** ondansetron (ZOFRAN) IV, oxyCODONE  Assessment/Plan:  Principal Problem:   Sepsis ([place]) Active Problems:   Diabetes mellitus without complication ([facility])   CAD (coronary artery disease) of artery bypass graft   Essential hypertension   Emphysematous cystitis   Leukocytosis   Hyperkalemia   Hyponatremia   CKD (chronic kidney disease), stage III    Sepsis most likely secondary to emphysematous cystitis/Escherichia coli Bacteremia Overall, patient has improved. Fevers appears to have subsided. Lactic acid is improved. WBC is normal. Blood culture is growing Escherichia coli. Antibiotics deescalated. Continue intravenous antibiotics given his fever overnight of 100.7,  Change to oral in the next 24 hours if remains afebrile. Urology is following. Saline lock IV fluids.   Back pain with recent lumbar surgery 10/12/Abnormal MRI 10/30 Patient underwent lumbar surgery on October 12. MRI report from October 30 revealed possibility of discitis and epidural abscess. Apparently patient saw Dr. Annette Stable in his office to discuss these findings. On 11/5 the case was discussed with Dr. Cyndy Freeze who was covering for Dr. Annette Stable. He reviewed the office notes as well as the MRI himself. He thinks that these changes are postoperative, as was felt by Dr. Annette Stable, and not suggestive of infection or an abscess. Patient does not have any neurological deficits. Plus he has another reason for his fever and sepsis. Continue analgesic agents and monitor closely. Will need follow-up with Dr. Annette Stable in the next 1-2 weeks.  Possible Acute on Chronic kidney disease stage III Creatinine has improved some and is now stable. He continues to have good urine output. Continue  to monitor urine output.   Hyponatremia Improved with IV hydration. Potassium level is also normal. Continue to monitor.  Diabetes mellitus type 2 CBGs are stable. Continue sliding scale coverage. HbA1c is 8.1.  History of coronary artery disease status post  bypass Stable. Continue home medications. He is not noted to be on any antiplatelet agent or a beta blocker or ACE inhibitor based on his home medication list. Not noted on the cardiology office notes as well. He is followed by Dr. Tamala Julian.  History of essential hypertension Blood pressure remained stable. Continue to monitor closely.   DVT Prophylaxis: Lovenox    Code Status: Full code  Family Communication: Discussed with the patient and his wife  Disposition Plan: Continue to mobilize. PT and OT has seen and he will need short-term rehabilitation at skilled nursing facility. Anticipate discharge in 1-2 days.    LOS: 4 days   [facility], [name]  [facility] Pager [phone] 10/30/2015, 11:02 AM  If 7PM-7AM, please contact night-coverage at [email or web address], password [facility]

## 2015-10-30 NOTE — Clinical Social Work Placement (Signed)
   CLINICAL SOCIAL WORK PLACEMENT  NOTE  Date:  10/30/2015  Patient Details  Name: [name] MRN: [id number] Date of Birth: Aug 17, 1934  Clinical Social Work is seeking post-discharge placement for this patient at the [place] level of care (*CSW will initial, date and re-position this form in  chart as items are completed):  Yes   Patient/family provided with [place] Work Department's list of facilities offering this level of care within the geographic area requested by the patient (or if unable, by the patient's family).  Yes   Patient/family informed of their freedom to choose among providers that offer the needed level of care, that participate in Medicare, Medicaid or managed care program needed by the patient, have an available bed and are willing to accept the patient.  Yes   Patient/family informed of [facility]'s ownership interest in [facility] and [facility], as well as of the fact that they are under no obligation to receive care at these facilities.  PASRR submitted to EDS on 10/30/15     PASRR number received on 10/30/15     Existing PASRR number confirmed on       FL2 transmitted to all facilities in geographic area requested by pt/family on       FL2 transmitted to all facilities within larger geographic area on       Patient informed that his/her managed care company has contracts with or will negotiate with certain facilities, including the following:            Patient/family informed of bed offers received.  Patient chooses bed at       Physician recommends and patient chooses bed at      Patient to be transferred to   on  .  Patient to be transferred to facility by       Patient family notified on   of transfer.  Name of family member notified:        PHYSICIAN Please sign FL2     Additional Comment:    _______________________________________________ Greta Doom, LCSW 10/30/2015, 12:08 PM

## 2015-10-31 DIAGNOSIS — I25810 Atherosclerosis of coronary artery bypass graft(s) without angina pectoris: Secondary | ICD-10-CM

## 2015-10-31 LAB — CULTURE, BLOOD (ROUTINE X 2)
Culture: NO GROWTH
Culture: NO GROWTH

## 2015-10-31 LAB — GLUCOSE, CAPILLARY
Glucose-Capillary: 168 mg/dL — ABNORMAL HIGH (ref 65–99)
Glucose-Capillary: 180 mg/dL — ABNORMAL HIGH (ref 65–99)

## 2015-10-31 MED ORDER — HYDROCODONE-ACETAMINOPHEN 10-325 MG PO TABS: 1.5000 | ORAL_TABLET | Freq: Four times a day (QID) | ORAL | Status: DC | PRN

## 2015-10-31 MED ORDER — LACTINEX PO CHEW: 1.0000 | CHEWABLE_TABLET | Freq: Three times a day (TID) | ORAL | Status: AC

## 2015-10-31 MED ORDER — CIPROFLOXACIN HCL 500 MG PO TABS: 500.0000 mg | ORAL_TABLET | Freq: Two times a day (BID) | ORAL | Status: AC

## 2015-10-31 NOTE — Discharge Summary (Addendum)
[name], is a 81 y.o. male  DOB Apr 03, 1934  MRN [id number].  Admission date:  10/25/2015  Admitting Physician  Theressa Millard, MD  Discharge Date:  10/31/2015   Primary MD  Mathews Argyle, MD  Recommendations for primary care physician for things to follow:  - Have basic labs including CBC, BMP during next visit - Patient to follow with urology as an outpatient  Admission Diagnosis  Emphysematous cystitis [N30.80] Sepsis, due to unspecified organism [facility]) [A41.9]   Discharge Diagnosis  Emphysematous cystitis [N30.80] Sepsis, due to unspecified organism [facility]) [A41.9]   Principal Problem:   Sepsis ([place]) Active Problems:   Diabetes mellitus without complication ([place])   CAD (coronary artery disease) of artery bypass graft   Essential hypertension   Emphysematous cystitis   Leukocytosis   Hyperkalemia   Hyponatremia   CKD (chronic kidney disease), stage III      Past Medical History  Diagnosis Date  . Hyperlipidemia   . Gout   . Glaucoma     "high normal"  . Diabetes mellitus without complication ([name])   . Myocardial infarction [name])     "told me I had a small heart attack"  . GERD (gastroesophageal reflux disease)   . Cancer [name]) 2010    prostate  . Arthritis   . H/O blood clots     R Lower leg, right upper leg  . Coronary artery disease   . Low back pain     "severe"  . Polymyalgia ([name])     h/o  . Osteoporosis   . Osteopenia   . Obesity   . Azotemia   . Thrombophlebitis   . Tinnitus   . Joint pain   . Heel spur   . Spinal stenosis   . Renal cyst, left   . Shingles     LEFT ABDOMEN  . Tubular adenoma   . Lesion of left native kidney   . Mild aortic stenosis   . Chondromalacia     Past Surgical History  Procedure Laterality Date  . Prostate surgery      seed implant  . Coronary artery bypass graft  2007  . Vein ligation and stripping Right 1972    right lower leg  . Appendectomy  60 years ago  . Cardiac catheterization    . Robotic assited partial nephrectomy Left 08/31/2013    Procedure: ROBOTIC ASSITED PARTIAL NEPHRECTOMY;  Surgeon: Dutch Gray, MD;  Location: [facility] ORS;  Service: Urology;  Laterality: Left;       History of present illness and  [facility]ons Course:     Kindly see H&P for history of present illness and admission details, please review complete Labs, Consult reports and Test reports for all details in brief  HPI  from the history and physical done on the day of admission 11/10 [name] is a 81 y.o. male with a history of CAD, DM2, HTN and recent Lumbar surgery 10/02/2015 who presents to the ED with confusion. His wife is at the  bedside and gives the history and reports that he has had complaints of lower ABD pain x 1-2 months, and this evening he had N+V x 1 and then became confused. He also had less urination over the past 2 days. He was evaluated in the ED and was found to have a fever to 101, and his lab studies revealed a leukocytosis at 17.9. A Sepsis workup was initiated, and a CT of the ABD/Pelvis was performed which revealed Emphysematous Cystitis. He was placed on IV Vancomycin, Levaquin, and Aztreonam and referred for admission.    Lyons Course   Sepsis most likely secondary to emphysematous cystitis/Escherichia coli Bacteremia Overall, patient has improved. Fevers  have subsided. Lactic acid is improved. WBC is normal. Blood culture is growing Escherichia coli. Antibiotics deescalated. Treated with total of 6 days of IV antibiotic during Lyons stay, initially on Prozac 10, transitioned to IV Cipro, to finish another 7 days of oral ciprofloxacin as an outpatient, repeat blood cultures with no growth to date at time of discharge.  Back pain with recent lumbar surgery 10/12/Abnormal MRI 10/30 Patient underwent lumbar surgery on October 12. MRI report from October 30 revealed possibility of  discitis and epidural abscess. Apparently patient saw Dr. Annette Stable in his office to discuss these findings. On 11/5 the case was discussed with Dr. Cyndy Freeze who was covering for Dr. Annette Stable. He reviewed the office notes as well as the MRI himself. He thinks that these changes are postoperative, as was felt by Dr. Annette Stable, and not suggestive of infection or an abscess. Patient does not have any neurological deficits. Plus he has another reason for his fever and sepsis. Continue analgesic agents and monitor closely. Will need follow-up with Dr. Annette Stable in the next 1-2 weeks.  Possible Acute on Chronic kidney disease stage III Creatinine has improved some and is now stable. Baseline is 2.4, creatinine is 1.6 on discharge.   Hyponatremia Improved with IV hydration. Potassium level is also normal.   Diabetes mellitus type 2  HbA1c is 8.1. CBG is acceptable and insulin sliding scale during Lyons stay, will discharge her home medication glipizide.  History of coronary artery disease status post bypass Stable. Continue home medications. He is not noted to be on any antiplatelet agent or a beta blocker or ACE inhibitor based on his home medication list. Not noted on the cardiology office notes as well. He is followed by Dr. Tamala Julian.  History of essential hypertension Blood pressure remained stable.   Discharge Condition:  Stable   Follow UP  Follow-up Information    Follow up with DAHLSTEDT, Lillette Boxer, MD.   Specialty:  Urology   Why:  call for appointment for approximate one month after discharge to check urine   Contact information:   [place] [address] [phone]       Follow up with Mathews Argyle, MD.   Specialty:  Internal Medicine   Why:  After discharge from SNF   Contact information:   [address] [place] [address] [phone]       Follow up with Earnie Larsson A, MD. Call in 2 weeks.   Specialty:  Neurosurgery   Contact information:   [address] [name] [address] [phone]         Discharge Instructions  and  Discharge Medications         Discharge Instructions    Discharge instructions    Complete by:  As directed  Follow with Primary MD Mathews Argyle, MD after discharge from SNF  Get CBC, CMP,checked  by Primary MD next visit.    Activity: As tolerated with Full fall precautions use walker/cane & assistance as needed   Disposition SNF   Diet: Heart Healthy , carbohydrate modified , with feeding assistance and aspiration precautions.  For Heart failure patients - Check your Weight same time everyday, if you gain over 2 pounds, or you develop in leg swelling, experience more shortness of breath or chest pain, call your Primary MD immediately. Follow Cardiac Low Salt Diet and 1.5 lit/day fluid restriction.   On your next visit with your primary care physician please Get Medicines reviewed and adjusted.   Please request your Prim.MD to go over all [facility] Tests and Procedure/Radiological results at the follow up, please get all Lyons records sent to your Prim MD by signing Lyons release before you go home.   If you experience worsening of your admission symptoms, develop shortness of breath, life threatening emergency, suicidal or homicidal thoughts you must seek medical attention immediately by calling 911 or calling your MD immediately  if symptoms less severe.  You Must read complete instructions/literature along with all the possible adverse reactions/side effects for all the Medicines you take and that have been prescribed to you. Take any new Medicines after you have completely understood and accpet all the possible adverse reactions/side effects.   Do not drive, operating heavy machinery, perform activities at heights, swimming or participation in water activities or provide baby sitting services if your were admitted for syncope or siezures until you have seen by  Primary MD or a Neurologist and advised to do so again.  Do not drive when taking Pain medications.    Do not take more than prescribed Pain, Sleep and Anxiety Medications  Special Instructions: If you have smoked or chewed Tobacco  in the last 2 yrs please stop smoking, stop any regular Alcohol  and or any Recreational drug use.  Wear Seat belts while driving.   Please note  You were cared for by a hospitalist during your [facility]ons stay. If you have any questions about your discharge medications or the care you received while you were in the [facility] after you are discharged, you can call the unit and asked to speak with the hospitalist on call if the hospitalist that took care of you is not available. Once you are discharged, your primary care physician will handle any further medical issues. Please note that NO REFILLS for any discharge medications will be authorized once you are discharged, as it is imperative that you return to your primary care physician (or establish a relationship with a primary care physician if you do not have one) for your aftercare needs so that they can reassess your need for medications and monitor your lab values.     Increase activity slowly    Complete by:  As directed             Medication List    TAKE these medications        acetaminophen 325 MG tablet  Commonly known as:  TYLENOL  Take 1 tablet (325 mg total) by mouth every 6 (six) hours as needed for pain (1-2 tablets q 6 hrs prn pain).     alendronate 70 MG tablet  Commonly known as:  FOSAMAX  Take 70 mg by mouth daily. Take with a full glass of water on an empty stomach. Patient takes this medication once  weekly on Saturday.     allopurinol 300 MG tablet  Commonly known as:  ZYLOPRIM  Take 300 mg by mouth daily.     ciprofloxacin 500 MG tablet  Commonly known as:  CIPRO  Take 1 tablet (500 mg total) by mouth 2 (two) times daily. Take for 1 week then stop     glipiZIDE 10 MG tablet    Commonly known as:  GLUCOTROL  Take 10 mg by mouth 2 (two) times daily before a meal.     HYDROcodone-acetaminophen 10-325 MG tablet  Commonly known as:  NORCO  Take 1.5 tablets by mouth every 6 (six) hours as needed.     lactobacillus acidophilus & bulgar chewable tablet  Chew 1 tablet by mouth 3 (three) times daily with meals.     rosuvastatin 5 MG tablet  Commonly known as:  CRESTOR  Take 5 mg by mouth daily.     tamsulosin 0.4 MG Caps capsule  Commonly known as:  FLOMAX  Take 0.4 mg by mouth daily.          Diet and Activity recommendation: See Discharge Instructions above   Consults obtained -  Urology   Major procedures and Radiology Reports - PLEASE review detailed and final reports for all details, in brief -      Ct Abdomen Pelvis Wo Contrast  10/26/2015  CLINICAL DATA:  81 yr old male with high WBC, Abdominal pain, sepsis, and recent lumbar surgery. EXAM: CT ABDOMEN AND PELVIS WITHOUT CONTRAST TECHNIQUE: Multidetector CT imaging of the abdomen and pelvis was performed following the standard protocol without IV contrast. COMPARISON:  08/21/2015 FINDINGS: Lung bases: Minor subsegmental atelectasis. Otherwise clear. Heart normal size. Liver: Diffuse fatty infiltration.  No mass or focal lesion. Gallbladder and biliary tree:  Unremarkable. Spleen:  Small calcifications otherwise unremarkable. Pancreas:  Unremarkable. Adrenal glands:  No masses. Kidneys, ureters, bladder: Bladder is thick-walled with air within the inner margin of the wall is well is nondependent air within the lumen of the gallbladder. Ureters are normal course and in caliber. Kidney show areas of scarring mostly on the left. There small nonobstructing stones in each kidney. Two small low-density lesions in the right kidney noted consistent with cysts. These findings in the kidneys are stable from the prior CT. Bladder findings are new. Lymph nodes:  No adenopathy. Ascites:  None. Vascular: Dense  atherosclerotic calcifications along a normal caliber aorta its branch vessels. Prostate: Multiple radiation therapy seeds lie within the prostate and prostate bed, stable. Gastrointestinal: There multiple colonic diverticula mostly along the left colon. No diverticulitis. No bowel wall thickening or inflammatory changes. No evidence of obstruction. Normal small bowel and stomach. Musculoskeletal: Laminectomies have been performed at L4 from the recent lumbar spine surgery. There is a minimal amount of epidural air in this location as well as a small amount of air along the right laminectomy defect. There is no evidence a postoperative abscess. No osteoblastic or osteolytic lesions. Degenerative changes noted throughout the lumbar spine. Abdominal wall: Small midline fat containing hernias. No bowel enters these. IMPRESSION: 1. Emphysematous cystitis. Bladder wall is thickened with air along the mucosal margin of the wall. There is also nondependent air within the urinary bladder. 2. No other acute findings. No hydronephrosis. No evidence of a postoperative abscess from the recent lumbar spine surgery. No evidence of diverticulitis. 3. Chronic changes include hepatic steatosis, left renal scarring and multiple colonic diverticula as well as degenerative changes throughout the visualized spine. Electronically Signed  By: Lajean Manes M.D.   On: 10/26/2015 01:18   Mr Lumbar Spine Wo Contrast  10/20/2015  CLINICAL DATA:  Constant pain in lower back for 2 weeks. Previous surgery 10/02/2015. History of diabetes. EXAM: MRI LUMBAR SPINE WITHOUT CONTRAST TECHNIQUE: Multiplanar, multisequence MR imaging of the lumbar spine was performed. No intravenous contrast was administered. COMPARISON:  Preoperative lumbar MRI 08/10/2015. FINDINGS: Segmentation: Normal. Alignment:  Trace retrolisthesis L1-2, otherwise anatomic. Vertebrae: There is abnormally increased edema of the L4 and L5 endplates in association with a T2 and  STIR hyperintense L4-5 disc. Concern is raised for early L4-5 diskitis and L4/L5 osteomyelitis. Conus medullaris: Normal in size, signal, and location. Paraspinal tissues: No evidence for hydronephrosis. Renal cystic disease is incompletely evaluated. The paravertebral soft tissues dorsally at L4 and L5 are markedly inflamed. Disc levels: L1-L2: Trace retrolisthesis. Schmorl's nodes. Severe disc space narrowing. Mild annular bulging. Foraminal disc material and endplate spurring on the RIGHT. RIGHT L1 nerve root could be irritated. L2-L3: Endplate spurring mild to moderate disc space narrowing. Slight bulging of the disc. Mild facet arthropathy is worse on the LEFT. No impingement. L3-L4: Severe disc space narrowing. Endplate spurring. Mild facet arthropathy. Dorsal epidural fluid collection on the LEFT effaces the thecal sac; this measures up to 5 mm thick. L4-L5: There is abnormal T2 hyperintense fluid surrounding thecal sac, which is significantly compressed. The patient has undergone RIGHT and LEFT laminotomies for decompression. Ventral epidural fluid on the RIGHT, and dorsal epidural fluid on the RIGHT and LEFT are contributory. There are small foci of T1 and T2 hypointensity within the wound suggestive of air bubbles, uncertain significance. The disc itself is T2 hyperintense and bulges mildly into the canal. BILATERAL L4 and L5 nerve root impingement are likely. L5-S1: Trace anterolisthesis. Mild bulge. Moderate facet arthropathy. IMPRESSION: Suspected L4-5 diskitis, L4 and L5 osteomyelitis, and epidural abscess resulting in severe compression of the thecal sac. See discussion above. Findings discussed with on-call provider 1:50 p.m. Electronically Signed   By: Staci Righter M.D.   On: 10/20/2015 13:56   Dg Chest Port 1 View  10/26/2015  CLINICAL DATA:  pt from home w/ c/o confusion and AMS, pt w/ recent hx of back surgery and taking vicodin for pain management. Pt has a hx of bypass x4 in 2007 and is  taking medication for type 2 diabetes. EXAM: PORTABLE CHEST 1 VIEW COMPARISON:  06/10/2015 FINDINGS: Changes from CABG surgery, stable. Cardiac silhouette is normal in size and configuration. No mediastinal or hilar masses or evidence of adenopathy. Clear lungs.  No pleural effusion or pneumothorax. Bony thorax is demineralized but grossly intact. IMPRESSION: No acute cardiopulmonary disease. Electronically Signed   By: Lajean Manes M.D.   On: 10/26/2015 00:09    Micro Results    Recent Results (from the past 240 hour(s))  Culture, blood (routine x 2)     Status: None   Collection Time: 10/25/15 11:40 PM  Result Value Ref Range Status   Specimen Description BLOOD RIGHT FOREARM  Final   Special Requests BOTTLES DRAWN AEROBIC AND ANAEROBIC 10CC  Final   Culture  Setup Time   Final    GRAM NEGATIVE RODS IN BOTH AEROBIC AND ANAEROBIC BOTTLES CRITICAL RESULT CALLED TO, READ BACK BY AND VERIFIED WITH: B RONCAOOO 10/26/15 @ [place]  Final   Report Status 10/28/2015 FINAL  Final   Organism ID, Bacteria ESCHERICHIA COLI  Final      Susceptibility  Escherichia coli - MIC*    AMPICILLIN <=2 SENSITIVE Sensitive     CEFAZOLIN <=4 SENSITIVE Sensitive     CEFEPIME <=1 SENSITIVE Sensitive     CEFTAZIDIME <=1 SENSITIVE Sensitive     CEFTRIAXONE <=1 SENSITIVE Sensitive     CIPROFLOXACIN <=0.25 SENSITIVE Sensitive     GENTAMICIN <=1 SENSITIVE Sensitive     IMIPENEM <=0.25 SENSITIVE Sensitive     TRIMETH/SULFA <=20 SENSITIVE Sensitive     AMPICILLIN/SULBACTAM <=2 SENSITIVE Sensitive     PIP/TAZO <=4 SENSITIVE Sensitive     * ESCHERICHIA COLI  Culture, blood (routine x 2)     Status: None   Collection Time: 10/26/15 12:05 AM  Result Value Ref Range Status   Specimen Description BLOOD LEFT ARM  Final   Special Requests BOTTLES DRAWN AEROBIC AND ANAEROBIC 5CC  Final   Culture  Setup Time   Final    GRAM NEGATIVE RODS IN BOTH AEROBIC AND ANAEROBIC BOTTLES CRITICAL  RESULT CALLED TO, READ BACK BY AND VERIFIED WITH: B RONCAOOO 10/26/15 @ 1145 M [place]ST[place]    Culture   Final    ESCHERICHIA COLI SUSCEPTIBILITIES PERFORMED ON PREVIOUS CULTURE WITHIN THE LAST 5 DAYS.    Report Status 10/28/2015 FINAL  Final  Urine culture     Status: None   Collection Time: 10/26/15  1:15 AM  Result Value Ref Range Status   Specimen Description URINE, CLEAN CATCH  Final   Special Requests NONE  Final   Culture 2,000 COLONIES/mL INSIGNIFICANT GROWTH  Final   Report Status 10/27/2015 FINAL  Final  MRSA PCR Screening     Status: None   Collection Time: 10/26/15  4:09 AM  Result Value Ref Range Status   MRSA by PCR NEGATIVE NEGATIVE Final    Comment:        The GeneXpert MRSA Assay (FDA approved for NASAL specimens only), is one component of a comprehensive MRSA colonization surveillance program. It is not intended to diagnose MRSA infection nor to guide or monitor treatment for MRSA infections.   Culture, blood (routine x 2)     Status: None (Preliminary result)   Collection Time: 10/26/15  6:15 PM  Result Value Ref Range Status   Specimen Description LEFT ANTECUBITAL  Final   Special Requests   Final    BOTTLES DRAWN AEROBIC AND ANAEROBIC 10CC PT ON VANC, AZACTAM   Culture NO GROWTH 4 DAYS  Final   Report Status PENDING  Incomplete  Culture, blood (routine x 2)     Status: None (Preliminary result)   Collection Time: 10/26/15  6:25 PM  Result Value Ref Range Status   Specimen Description BLOOD LEFT WRIST  Final   Special Requests   Final    BOTTLES DRAWN AEROBIC ONLY 10CC PATIENT ON VANC,AZACTAM   Culture NO GROWTH 4 DAYS  Final   Report Status PENDING  Incomplete       Today   Subjective:   Maynard Pinta today has no headache,no chest abdominal pain,no new weakness tingling or numbness, feels much better  today.   Objective:   Blood pressure 102/57, pulse 96, temperature 98.4 F (36.9 C), temperature source Oral, resp. rate 20, height 5' 7.32" (1.71  m), weight 90.719 kg (200 lb), SpO2 92 %.   Intake/Output Summary (Last 24 hours) at 10/31/15 1054 Last data filed at 10/31/15 0804  Gross per 24 hour  Intake    580 ml  Output   1350 ml  Net   -770 ml  Exam General appearance: alert, cooperative, appears stated age and no distress Resp: No crackles, wheezing. No rhonchi. Reasonably good air entry bilaterally. Cardio: S1, S2 normal, regular. No S3, S4. No rubs, bruit. Systolic murmur appreciated over the apex. GI: Abdomen soft. Nontender. Nondistended. Bowel sounds are present. No masses or organomegaly Back: Scar in the lower back from recent incision. No erythema. No tenderness over that area. It is not warm to touch. Neurologic: Alert. Cranial nerves II-12 intact. No motor deficits appreciated. Able to lift both legs off the bed.  Data Review   CBC w Diff:  Lab Results  Component Value Date   WBC 7.0 10/30/2015   WBC 6.7 11/21/2008   HGB 9.8* 10/30/2015   HGB 13.7 11/21/2008   HCT 30.3* 10/30/2015   HCT 40.4 11/21/2008   PLT 161 10/30/2015   PLT 233 11/21/2008   LYMPHOPCT 3 10/25/2015   LYMPHOPCT 18.4 11/21/2008   MONOPCT 4 10/25/2015   MONOPCT 6.1 11/21/2008   EOSPCT 0 10/25/2015   EOSPCT 0.7 11/21/2008   BASOPCT 0 10/25/2015   BASOPCT 0.3 11/21/2008    CMP:  Lab Results  Component Value Date   NA 132* 10/30/2015   K 3.9 10/30/2015   CL 95* 10/30/2015   CO2 26 10/30/2015   BUN 18 10/30/2015   CREATININE 1.65* 10/30/2015   PROT 6.9 10/25/2015   ALBUMIN 2.7* 10/25/2015   BILITOT 1.1 10/25/2015   ALKPHOS 85 10/25/2015   AST 26 10/25/2015   ALT 17 10/25/2015  .   Total Time in preparing paper work, data evaluation and todays exam - 35 minutes  ELGERGAWY, DAWOOD M.D on 10/31/2015 at 10:54 AM  [facility]   Office  [phone]

## 2015-10-31 NOTE — Discharge Instructions (Signed)
Follow with Primary MD Mathews Argyle, MD after discharge from SNF  Get CBC, CMP,checked  by Primary MD next visit.    Activity: As tolerated with Full fall precautions use walker/cane & assistance as needed   Disposition SNF   Diet: Heart Healthy  , with feeding assistance and aspiration precautions.  For Heart failure patients - Check your Weight same time everyday, if you gain over 2 pounds, or you develop in leg swelling, experience more shortness of breath or chest pain, call your Primary MD immediately. Follow Cardiac Low Salt Diet and 1.5 lit/day fluid restriction.   On your next visit with your primary care physician please Get Medicines reviewed and adjusted.   Please request your Prim.MD to go over all Hospital Tests and Procedure/Radiological results at the follow up, please get all Hospital records sent to your Prim MD by signing hospital release before you go home.   If you experience worsening of your admission symptoms, develop shortness of breath, life threatening emergency, suicidal or homicidal thoughts you must seek medical attention immediately by calling 911 or calling your MD immediately  if symptoms less severe.  You Must read complete instructions/literature along with all the possible adverse reactions/side effects for all the Medicines you take and that have been prescribed to you. Take any new Medicines after you have completely understood and accpet all the possible adverse reactions/side effects.   Do not drive, operating heavy machinery, perform activities at heights, swimming or participation in water activities or provide baby sitting services if your were admitted for syncope or siezures until you have seen by Primary MD or a Neurologist and advised to do so again.  Do not drive when taking Pain medications.    Do not take more than prescribed Pain, Sleep and Anxiety Medications  Special Instructions: If you have smoked or chewed Tobacco  in the  last 2 yrs please stop smoking, stop any regular Alcohol  and or any Recreational drug use.  Wear Seat belts while driving.   Please note  You were cared for by a hospitalist during your hospital stay. If you have any questions about your discharge medications or the care you received while you were in the hospital after you are discharged, you can call the unit and asked to speak with the hospitalist on call if the hospitalist that took care of you is not available. Once you are discharged, your primary care physician will handle any further medical issues. Please note that NO REFILLS for any discharge medications will be authorized once you are discharged, as it is imperative that you return to your primary care physician (or establish a relationship with a primary care physician if you do not have one) for your aftercare needs so that they can reassess your need for medications and monitor your lab values.

## 2015-10-31 NOTE — Progress Notes (Signed)
Patient resting, wife at bedside, call light within reach.

## 2015-10-31 NOTE — Clinical Social Work Placement (Signed)
   CLINICAL SOCIAL WORK PLACEMENT  NOTE  Date:  10/31/2015  Patient Details  Name: [name] MRN: [id number] Date of Birth: 27-Feb-1934  Clinical Social Work is seeking post-discharge placement for this patient at the [place] level of care (*CSW will initial, date and re-position this form in  chart as items are completed):  Yes   Patient/family provided with [facility] Work Department's list of facilities offering this level of care within the geographic area requested by the patient (or if unable, by the patient's family).  Yes   Patient/family informed of their freedom to choose among providers that offer the needed level of care, that participate in Medicare, Medicaid or managed care program needed by the patient, have an available bed and are willing to accept the patient.  Yes   Patient/family informed of [facility]'s ownership interest in [facility] and [facility], as well as of the fact that they are under no obligation to receive care at these facilities.  PASRR submitted to EDS on 10/30/15     PASRR number received on 10/30/15     Existing PASRR number confirmed on       FL2 transmitted to all facilities in geographic area requested by pt/family on       FL2 transmitted to all facilities within larger geographic area on       Patient informed that his/her managed care company has contracts with or will negotiate with certain facilities, including the following:        Yes   Patient/family informed of bed offers received.  Patient chooses bed at [facility]     Physician recommends and patient chooses bed at      Patient to be transferred to [facility] on 10/31/15.  Patient to be transferred to facility by [facility]      Patient family notified on 10/31/15 of transfer.  Name of family member notified:  PEARLY HANKO, wife      PHYSICIAN       Additional Comment:     _______________________________________________ Greta Doom, LCSW 10/31/2015, 11:33 AM

## 2015-10-31 NOTE — Progress Notes (Signed)
Discharged with instructions, report called to [place] at [place]. Discharged via ambulance/stretcher.

## 2015-11-04 ENCOUNTER — Non-Acute Institutional Stay (SKILLED_NURSING_FACILITY): Payer: Medicare Other | Admitting: Internal Medicine

## 2015-11-04 DIAGNOSIS — E785 Hyperlipidemia, unspecified: Secondary | ICD-10-CM | POA: Diagnosis not present

## 2015-11-04 DIAGNOSIS — N289 Disorder of kidney and ureter, unspecified: Secondary | ICD-10-CM

## 2015-11-04 DIAGNOSIS — R5381 Other malaise: Secondary | ICD-10-CM

## 2015-11-04 DIAGNOSIS — D72829 Elevated white blood cell count, unspecified: Secondary | ICD-10-CM | POA: Diagnosis not present

## 2015-11-04 DIAGNOSIS — M810 Age-related osteoporosis without current pathological fracture: Secondary | ICD-10-CM

## 2015-11-04 DIAGNOSIS — E871 Hypo-osmolality and hyponatremia: Secondary | ICD-10-CM

## 2015-11-04 DIAGNOSIS — M5106 Intervertebral disc disorders with myelopathy, lumbar region: Secondary | ICD-10-CM

## 2015-11-04 DIAGNOSIS — D649 Anemia, unspecified: Secondary | ICD-10-CM | POA: Diagnosis not present

## 2015-11-04 DIAGNOSIS — N3080 Other cystitis without hematuria: Secondary | ICD-10-CM | POA: Diagnosis not present

## 2015-11-04 DIAGNOSIS — E119 Type 2 diabetes mellitus without complications: Secondary | ICD-10-CM | POA: Diagnosis not present

## 2015-11-04 NOTE — Progress Notes (Signed)
Patient ID: LIAD MIU, male   DOB: 1934/07/08, 81 y.o.   MRN: [id number]     Facility: [facility]    PCP: Mathews Argyle, MD  Code Status: full code  Allergies  Allergen Reactions  . Adacel [Diphth-Acell Pertussis-Tetanus]   . Lipitor [Atorvastatin] Other (See Comments)    Muscle pain  . Penicillins     UNKNOWN  . Septra [Sulfamethoxazole-Trimethoprim]   . Simvastatin     MUSCLE PAIN    Chief Complaint  Patient presents with  . New Admit To SNF     HPI:  81 y.o. patient is here for short term rehabilitation post hospital admission from 10/25/15-10/31/15 with empyematous E.coli cystitis and sepsis. he was treated with iv antibiotics and transitioned to po antibiotics on discharge. He was seen by Dr Cyndy Freeze with concern of lumbar abscess and this was thought to be post op changes rather than abscess. Pain management and outpatient follow up was recommended. He had hyponatremia and acute renal failure which responded well to iv fluids. He has PMH of CAD, HTN, DM2 and recent lumbar surgery. He is seen in his room today. He complaints of lower back pain. He mentions current pain regimen to be helpful. He also complaints of dysuria. He feels weak and tired. His friend is present during this visit.   Review of Systems:  Constitutional: Negative for fever, chills, diaphoresis.  HENT: Negative for headache, congestion, nasal discharge Eyes: Negative for eye pain, blurred vision, double vision and discharge.  Respiratory: Negative for cough, shortness of breath and wheezing.   Cardiovascular: Negative for chest pain, palpitations, leg swelling.  Gastrointestinal: Negative for heartburn, nausea, vomiting, abdominal pain. Had bowel movement yesterday Genitourinary: Negative for flank pain and hematuria  Musculoskeletal: Negative for falls in the facility Skin: Negative for itching, rash.  Neurological: Negative for dizziness Psychiatric/Behavioral:  Negative for depression   Past Medical History  Diagnosis Date  . Hyperlipidemia   . Gout   . Glaucoma     "high normal"  . Diabetes mellitus without complication ([place])   . Myocardial infarction [facility])     "told me I had a small heart attack"  . GERD (gastroesophageal reflux disease)   . Cancer [facility]) 2010    prostate  . Arthritis   . H/O blood clots     R Lower leg, right upper leg  . Coronary artery disease   . Low back pain     "severe"  . Polymyalgia ([place])     h/o  . Osteoporosis   . Osteopenia   . Obesity   . Azotemia   . Thrombophlebitis   . Tinnitus   . Joint pain   . Heel spur   . Spinal stenosis   . Renal cyst, left   . Shingles     LEFT ABDOMEN  . Tubular adenoma   . Lesion of left native kidney   . Mild aortic stenosis   . Chondromalacia    Past Surgical History  Procedure Laterality Date  . Prostate surgery      seed implant  . Coronary artery bypass graft  2007  . Vein ligation and stripping Right 1972    right lower leg  . Appendectomy  60 years ago  . Cardiac catheterization    . Robotic assited partial nephrectomy Left 08/31/2013    Procedure: ROBOTIC ASSITED PARTIAL NEPHRECTOMY;  Surgeon: Dutch Gray, MD;  Location: [facility];  Service: Urology;  Laterality: Left;   Social  History:   reports that he quit smoking about 34 years ago. His smoking use included Cigarettes. He has a 90 pack-year smoking history. He has never used smokeless tobacco. He reports that he does not drink alcohol or use illicit drugs.  Family History  Problem Relation Age of Onset  . Heart Problems Mother   . Heart Problems Father     Medications:   Medication List       This list is accurate as of: 11/04/15 10:34 AM.  Always use your most recent med list.               acetaminophen 325 MG tablet  Commonly known as:  TYLENOL  Take 1 tablet (325 mg total) by mouth every 6 (six) hours as needed for pain (1-2 tablets q 6 hrs prn pain).     alendronate 70 MG  tablet  Commonly known as:  FOSAMAX  Take 70 mg by mouth daily. Take with a full glass of water on an empty stomach. Patient takes this medication once weekly on Saturday.     allopurinol 300 MG tablet  Commonly known as:  ZYLOPRIM  Take 300 mg by mouth daily.     ciprofloxacin 500 MG tablet  Commonly known as:  CIPRO  Take 1 tablet (500 mg total) by mouth 2 (two) times daily. Take for 1 week then stop     glipiZIDE 10 MG tablet  Commonly known as:  GLUCOTROL  Take 10 mg by mouth 2 (two) times daily before a meal.     HYDROcodone-acetaminophen 10-325 MG tablet  Commonly known as:  NORCO  Take 1.5 tablets by mouth every 6 (six) hours as needed.     lactobacillus acidophilus & bulgar chewable tablet  Chew 1 tablet by mouth 3 (three) times daily with meals.     rosuvastatin 5 MG tablet  Commonly known as:  CRESTOR  Take 5 mg by mouth daily.     tamsulosin 0.4 MG Caps capsule  Commonly known as:  FLOMAX  Take 0.4 mg by mouth daily.         Physical Exam: Filed Vitals:   11/04/15 1031  BP: 92/57  Pulse: 100  Temp: 98.9 F (37.2 C)  Resp: 18    General- elderly male, obese, in no acute distress Head- normocephalic, atraumatic Nose- no maxillary or frontal sinus tenderness, no nasal discharge Throat- moist mucus membrane  Eyes- PERRLA, EOMI, no pallor, no icterus, no discharge, normal conjunctiva, normal sclera Neck- no cervical lymphadenopathy Cardiovascular- normal s1,s2, no murmurs, no leg edema Respiratory- bilateral clear to auscultation, no wheeze, no rhonchi, no crackles, no use of accessory muscles Abdomen- bowel sounds present, soft, non tender Musculoskeletal- able to move all 4 extremities, generalized weakness +  Neurological- no focal deficit, alert and oriented to person, place and time Skin- warm and dry, surgical incision to the back healing well Psychiatry- normal mood and affect    Labs reviewed: Basic Metabolic Panel:  Recent Labs   10/28/15 0312 10/29/15 0207 10/30/15 0317  NA 134* 133* 132*  K 4.5 4.5 3.9  CL 101 99* 95*  CO2 25 27 26   GLUCOSE 139* 159* 230*  BUN 19 18 18   CREATININE 1.75* 1.77* 1.65*  CALCIUM 8.2* 8.4* 8.5*   Liver Function Tests:  Recent Labs  10/25/15 2330  AST 26  ALT 17  ALKPHOS 85  BILITOT 1.1  PROT 6.9  ALBUMIN 2.7*    Recent Labs  10/25/15 2330  LIPASE  25   No results for input(s): AMMONIA in the last 8760 hours. CBC:  Recent Labs  10/25/15 2330  10/28/15 0312 10/29/15 0207 10/30/15 0317  WBC 17.9*  < > 4.9 5.5 7.0  NEUTROABS 16.6*  --   --   --   --   HGB 12.4*  < > 9.5* 10.0* 9.8*  HCT 37.0*  < > 29.4* 30.8* 30.3*  MCV 95.6  < > 97.0 96.0 95.3  PLT 260  < > 147* 140* 161  < > = values in this interval not displayed. Cardiac Enzymes: No results for input(s): CKTOTAL, CKMB, CKMBINDEX, TROPONINI in the last 8760 hours. BNP: Invalid input(s): POCBNP CBG:  Recent Labs  10/30/15 2133 10/31/15 0608 10/31/15 1125  GLUCAP 190* 168* 180*    Radiological Exams: Ct Abdomen Pelvis Wo Contrast  10/26/2015  CLINICAL DATA:  81 yr old male with high WBC, Abdominal pain, sepsis, and recent lumbar surgery. EXAM: CT ABDOMEN AND PELVIS WITHOUT CONTRAST TECHNIQUE: Multidetector CT imaging of the abdomen and pelvis was performed following the standard protocol without IV contrast. COMPARISON:  08/21/2015 FINDINGS: Lung bases: Minor subsegmental atelectasis. Otherwise clear. Heart normal size. Liver: Diffuse fatty infiltration.  No mass or focal lesion. Gallbladder and biliary tree:  Unremarkable. Spleen:  Small calcifications otherwise unremarkable. Pancreas:  Unremarkable. Adrenal glands:  No masses. Kidneys, ureters, bladder: Bladder is thick-walled with air within the inner margin of the wall is well is nondependent air within the lumen of the gallbladder. Ureters are normal course and in caliber. Kidney show areas of scarring mostly on the left. There small nonobstructing  stones in each kidney. Two small low-density lesions in the right kidney noted consistent with cysts. These findings in the kidneys are stable from the prior CT. Bladder findings are new. Lymph nodes:  No adenopathy. Ascites:  None. Vascular: Dense atherosclerotic calcifications along a normal caliber aorta its branch vessels. Prostate: Multiple radiation therapy seeds lie within the prostate and prostate bed, stable. Gastrointestinal: There multiple colonic diverticula mostly along the left colon. No diverticulitis. No bowel wall thickening or inflammatory changes. No evidence of obstruction. Normal small bowel and stomach. Musculoskeletal: Laminectomies have been performed at L4 from the recent lumbar spine surgery. There is a minimal amount of epidural air in this location as well as a small amount of air along the right laminectomy defect. There is no evidence a postoperative abscess. No osteoblastic or osteolytic lesions. Degenerative changes noted throughout the lumbar spine. Abdominal wall: Small midline fat containing hernias. No bowel enters these. IMPRESSION: 1. Emphysematous cystitis. Bladder wall is thickened with air along the mucosal margin of the wall. There is also nondependent air within the urinary bladder. 2. No other acute findings. No hydronephrosis. No evidence of a postoperative abscess from the recent lumbar spine surgery. No evidence of diverticulitis. 3. Chronic changes include hepatic steatosis, left renal scarring and multiple colonic diverticula as well as degenerative changes throughout the visualized spine. Electronically Signed   By: Lajean Manes M.D.   On: 10/26/2015 01:18   Dg Chest Port 1 View  10/26/2015  CLINICAL DATA:  pt from home w/ c/o confusion and AMS, pt w/ recent hx of back surgery and taking vicodin for pain management. Pt has a hx of bypass x4 in 2007 and is taking medication for type 2 diabetes. EXAM: PORTABLE CHEST 1 VIEW COMPARISON:  06/10/2015 FINDINGS: Changes  from CABG surgery, stable. Cardiac silhouette is normal in size and configuration. No mediastinal or hilar masses  or evidence of adenopathy. Clear lungs.  No pleural effusion or pneumothorax. Bony thorax is demineralized but grossly intact. IMPRESSION: No acute cardiopulmonary disease. Electronically Signed   By: Lajean Manes M.D.   On: 10/26/2015 00:09    Assessment/Plan  Physical deconditioning Will have him work with physical therapy and occupational therapy team to help with gait training and muscle strengthening exercises.fall precautions. Skin care. Encourage to be out of bed.   Empyematous e.coli uti Continue cipro until 11/07/15. Monitor clinically. Maintain hydration. Continue probiotic. Needs urology f/u  Anemia unspecified monitor cbc  Intervertebral lumbar disc disorder Stable, continue norco 10-325 mg one and a half tab q6h prn, back precautions and to work with therapy team. Needs f/u with neurosurgery  Hyponatremia Monitor bmp  Renal impairment Monitor bmp  Osteoporosis Continue weekly fosamax  DM Monitor cbg and continue glipizide for now  HLD Continue crestor   Goals of care: short term rehabilitation   Labs/tests ordered: cbc with diff, cmp, urology, neurosurgery f/u  Family/ staff Communication: reviewed care plan with patient and nursing supervisor    Blanchie Serve, MD  [facility] [phone] (Monday-Friday 8 am - 5 pm) [phone] (afterhours)

## 2015-11-07 ENCOUNTER — Non-Acute Institutional Stay (SKILLED_NURSING_FACILITY): Payer: Medicare Other | Admitting: Internal Medicine

## 2015-11-07 DIAGNOSIS — M62838 Other muscle spasm: Secondary | ICD-10-CM | POA: Diagnosis not present

## 2015-11-07 DIAGNOSIS — M5106 Intervertebral disc disorders with myelopathy, lumbar region: Secondary | ICD-10-CM | POA: Diagnosis not present

## 2015-11-07 DIAGNOSIS — K5900 Constipation, unspecified: Secondary | ICD-10-CM

## 2015-11-08 ENCOUNTER — Other Ambulatory Visit: Payer: Self-pay

## 2015-11-08 MED ORDER — HYDROCODONE-ACETAMINOPHEN 10-325 MG PO TABS: 1.5000 | ORAL_TABLET | Freq: Four times a day (QID) | ORAL | Status: DC | PRN

## 2015-11-10 NOTE — Progress Notes (Signed)
Patient ID: [name], male   DOB: 03-Mar-1934, 81 y.o.   MRN: [id number]    Facility: [facility]   Chief Complaint  Patient presents with  . Acute Visit    uncontrolled pain, abuse of home opioids, constipation   Allergies  Allergen Reactions  . Adacel [Diphth-Acell Pertussis-Tetanus]   . Flexeril [Cyclobenzaprine]     Causes confusion  . Lipitor [Atorvastatin] Other (See Comments)    Muscle pain  . Penicillins     UNKNOWN  . Septra [Sulfamethoxazole-Trimethoprim]   . Simvastatin     MUSCLE PAIN   HPI 81 y/o male patient seen today with acute concerns. His back pain is not under control with current regiemn. He has been noted by staff to take additional narcotic medication from home that his wife has brought. He also has been constipated and feels bloated and uneasy. He complaints of muscle tightness.   Review of Systems  Constitutional: Negative for fever, chills  HENT: Negative for congestion, sore throat.  Eyes: Negative for blurred vision, double vision and discharge.  Respiratory: Negative for cough, shortness of breath and wheezing.  Cardiovascular: Negative for chest pain, palpitations  Gastrointestinal: Negative for heartburn, nausea, vomiting Genitourinary: Negative for dysuria, flank pain.  Musculoskeletal: Negative for falls Neurological: negative for dizziness  Past Medical History  Diagnosis Date  . Hyperlipidemia   . Gout   . Glaucoma     "high normal"  . Diabetes mellitus without complication ([place])   . Myocardial infarction [facility])     "told me I had a small heart attack"  . GERD (gastroesophageal reflux disease)   . Cancer [facility]) 2010    prostate  . Arthritis   . H/O blood clots     R Lower leg, right upper leg  . Coronary artery disease   . Low back pain     "severe"  . Polymyalgia ([place])     h/o  . Osteoporosis   . Osteopenia   . Obesity   . Azotemia   . Thrombophlebitis   . Tinnitus   . Joint pain   . Heel  spur   . Spinal stenosis   . Renal cyst, left   . Shingles     LEFT ABDOMEN  . Tubular adenoma   . Lesion of left native kidney   . Mild aortic stenosis   . Chondromalacia   . Hypertension    Medication reviewed. See [facility]  Physical exam Pulse 89  Temp(Src) 97.4 F (36.3 C)  Resp 16  SpO2 95%  General- elderly male in no acute distress Head- atraumatic, normocephalic Eyes- PERRLA, EOMI, no pallor, no icterus Neck- no lymphadenopathy, no jugular vein distension Cardiovascular- normal s1,s2, no murmurs Respiratory- bilateral clear to auscultation, no wheeze, no rhonchi, no crackles Abdomen- bowel sounds present, soft, non tender, no guarding or rigidity Musculoskeletal- able to move all 4 extremities, paraspinal tenderness to lumbar area with muscle spasm noted Neurological- no focal deficit, alert and oriented to person, place and time, normal mood and affect   Assessment/plan  Low back pain Change his norco to 10-325 mg 1-2 tab q6h prn pain from one and a half tablet q6h prn and monitor. Monitor for sedation. Advised patient and his wife not to take home medication. Both voice understanding this and wife taking all the medications home. Needs follow up with his neurosurgeon  Muscle spasm Add baclofen 5 mg bid x 3 days, then change to q12h prn and monitor. To  work with therapy team  Constipation Add miralax daily with senna s 2 tab at bedtime, hydration encouraged, monitor  Blanchie Serve, MD  [facility] [phone] (Monday-Friday 8 am - 5 pm) [phone] (afterhours)

## 2015-11-11 ENCOUNTER — Other Ambulatory Visit: Payer: Self-pay

## 2015-11-11 NOTE — Telephone Encounter (Signed)
Pharmacy faxed er request to find out which instructions were correct spoke with a pharmacist named Carra told her correct instructions. [phone] )

## 2015-11-12 ENCOUNTER — Non-Acute Institutional Stay (SKILLED_NURSING_FACILITY): Payer: Medicare Other | Admitting: Internal Medicine

## 2015-11-12 DIAGNOSIS — M62830 Muscle spasm of back: Secondary | ICD-10-CM

## 2015-11-12 DIAGNOSIS — M5106 Intervertebral disc disorders with myelopathy, lumbar region: Secondary | ICD-10-CM

## 2015-11-12 NOTE — Progress Notes (Signed)
Patient ID: [name], male   DOB: 1934-10-16, 81 y.o.   MRN: [id number]    Facility: [facility]   Chief Complaint  Patient presents with  . Acute Visit    ongoing back pain with knots  . Allergies  Allergen Reactions  . Adacel [Diphth-Acell Pertussis-Tetanus]   . Lipitor [Atorvastatin] Other (See Comments)    Muscle pain  . Penicillins     UNKNOWN  . Septra [Sulfamethoxazole-Trimethoprim]   . Simvastatin     MUSCLE PAIN    HPI 81 y/o male patient seen today with acute concerns. His back pain is not under control with current regiemn. He feels the medication to be helping but feels the need for some addition in between as the pain is worse when the effect wears off. Denies any urinary complaints. He complaints of tightness in his back along with the pain. He was started on baclofen prn last visit but has not asked for it. He grades his pain at 5/10 at present  Review of Systems  Constitutional: Negative for fever, chills, diaphoresis.  HENT: Negative for congestion, sore throat.   Eyes: Negative for blurred vision, double vision and discharge.  Respiratory: Negative for cough, shortness of breath and wheezing.   Cardiovascular: Negative for chest pain, palpitations  Gastrointestinal: Negative for heartburn, nausea, vomiting Genitourinary: Negative for dysuria, flank pain.  Musculoskeletal: Negative for falls  Past Medical History  Diagnosis Date  . Hyperlipidemia   . Gout   . Glaucoma     "high normal"  . Diabetes mellitus without complication ([place])   . Myocardial infarction [facility])     "told me I had a small heart attack"  . GERD (gastroesophageal reflux disease)   . Cancer [facility]) 2010    prostate  . Arthritis   . H/O blood clots     R Lower leg, right upper leg  . Coronary artery disease   . Low back pain     "severe"  . Polymyalgia ([place])     h/o  . Osteoporosis   . Osteopenia   . Obesity   . Azotemia   . Thrombophlebitis   .  Tinnitus   . Joint pain   . Heel spur   . Spinal stenosis   . Renal cyst, left   . Shingles     LEFT ABDOMEN  . Tubular adenoma   . Lesion of left native kidney   . Mild aortic stenosis   . Chondromalacia    Medication reviewed. See [facility]  Physical exam BP 119/62 mmHg  Pulse 67  Temp(Src) 97.1 F (36.2 C)  Resp 18  SpO2 96%  General- elderly male in no acute distress Head- atraumatic, normocephalic Eyes- PERRLA, EOMI, no pallor, no icterus Neck- no lymphadenopathy, no jugular vein distension Cardiovascular- normal s1,s2, no murmurs Respiratory- bilateral clear to auscultation, no wheeze, no rhonchi, no crackles Abdomen- bowel sounds present, soft, non tender Musculoskeletal- able to move all 4 extremities, paraspinal tenderness to lumbar area with muscle spasm noted Neurological- no focal deficit, alert and oriented to person, place and time, normal mood and affect   Assessment/plan  Lumbar disc degenerative disorder with myelopathy Ongoing. Afebrile. Neurologically intact. Pain limiting participation with therapy. Change his norco to 10-325 mg 1-2 tab q4h prn pain and monitor for sedation. If pain persists, will consider referral to pain clinic. Advised to be out of bed as tolerated. To work with therapy team. Fall and back precautions. Has appointment with neurosurgery  on 11/20/15  Muscle spasm Change his baclofen to 5 mg tid for now and reassess.   Reviewed care plan with patient and he voices understanding this   Blanchie Serve, MD  [facility] [phone] (Monday-Friday 8 am - 5 pm) [phone] (afterhours)

## 2015-11-18 ENCOUNTER — Other Ambulatory Visit: Payer: Self-pay | Admitting: *Deleted

## 2015-11-18 MED ORDER — HYDROCODONE-ACETAMINOPHEN 10-325 MG PO TABS: ORAL_TABLET | ORAL | Status: DC

## 2015-11-18 NOTE — Telephone Encounter (Signed)
[facility] 

## 2015-11-22 ENCOUNTER — Other Ambulatory Visit (HOSPITAL_COMMUNITY): Payer: Self-pay | Admitting: Neurosurgery

## 2015-11-22 ENCOUNTER — Encounter (HOSPITAL_COMMUNITY)
Admission: RE | Admit: 2015-11-22 | Discharge: 2015-11-22 | Disposition: A | Discharge status: Home / Self Care | Payer: Medicare Other | Source: Ambulatory Visit | Attending: Neurosurgery | Admitting: Neurosurgery

## 2015-11-22 ENCOUNTER — Ambulatory Visit (HOSPITAL_COMMUNITY)
Admission: RE | Admit: 2015-11-22 | Discharge: 2015-11-22 | Disposition: A | Discharge status: Home / Self Care | Payer: Medicare Other | Source: Ambulatory Visit | Attending: Neurosurgery | Admitting: Neurosurgery

## 2015-11-22 DIAGNOSIS — Z792 Long term (current) use of antibiotics: Secondary | ICD-10-CM | POA: Insufficient documentation

## 2015-11-22 DIAGNOSIS — M4640 Discitis, unspecified, site unspecified: Secondary | ICD-10-CM

## 2015-11-22 DIAGNOSIS — M4626 Osteomyelitis of vertebra, lumbar region: Secondary | ICD-10-CM | POA: Insufficient documentation

## 2015-11-22 MED ORDER — LIDOCAINE HCL 1 % IJ SOLN
INTRAMUSCULAR | Status: DC
Start: 2015-11-22 — End: 2015-11-23
  Filled 2015-11-22: qty 20

## 2015-11-22 MED ORDER — VANCOMYCIN HCL IN DEXTROSE 1-5 GM/200ML-% IV SOLN: 1000.0000 mg | Freq: Once | INTRAVENOUS | Status: DC

## 2015-11-22 MED ORDER — VANCOMYCIN HCL IN DEXTROSE 1-5 GM/200ML-% IV SOLN
INTRAVENOUS | Status: AC
  Administered 2015-11-22: 1 g
  Filled 2015-11-22: qty 200

## 2015-11-22 MED ORDER — HEPARIN SOD (PORK) LOCK FLUSH 100 UNIT/ML IV SOLN
250.0000 [IU] | INTRAVENOUS | Status: AC | PRN
  Administered 2015-11-22: 250 [IU]

## 2015-11-22 MED ORDER — HEPARIN SOD (PORK) LOCK FLUSH 100 UNIT/ML IV SOLN
INTRAVENOUS | Status: AC
  Filled 2015-11-22: qty 5

## 2015-11-22 MED ORDER — HEPARIN SOD (PORK) LOCK FLUSH 100 UNIT/ML IV SOLN
INTRAVENOUS | Status: AC
  Administered 2015-11-22: 250 [IU]
  Filled 2015-11-22: qty 5

## 2015-11-22 NOTE — Procedures (Signed)
Successful placement of single lumen PICC line to right brachial vein. Length 35cm Tip at lower SVC/RA No complications Ready for use.  Tsosie Billing D PA-C 11/22/2015 1400

## 2015-12-09 ENCOUNTER — Non-Acute Institutional Stay (SKILLED_NURSING_FACILITY): Payer: Medicare Other | Admitting: Nurse Practitioner

## 2015-12-09 DIAGNOSIS — M4646 Discitis, unspecified, lumbar region: Secondary | ICD-10-CM | POA: Diagnosis not present

## 2015-12-09 DIAGNOSIS — M5106 Intervertebral disc disorders with myelopathy, lumbar region: Secondary | ICD-10-CM

## 2015-12-09 NOTE — Progress Notes (Signed)
Patient ID: [name], male   DOB: Jan 08, 1934, 81 y.o.   MRN: [id number]    Nursing Home Location:  [place] of Service: SNF (31)  PCP: Mathews Argyle, MD  Allergies  Allergen Reactions  . Adacel [Diphth-Acell Pertussis-Tetanus]   . Lipitor [Atorvastatin] Other (See Comments)    Muscle pain  . Penicillins     UNKNOWN  . Septra [Sulfamethoxazole-Trimethoprim]   . Simvastatin     MUSCLE PAIN    Chief Complaint  Patient presents with  . Acute Visit    HPI:  Patient is a 81 y.o. male seen today at [facility] at the request of nursing due to uncontrolled pain. Pt with hx of chronic pain, gout, DM, GERD, OA, CAD, OP. Pt recently dx with Osteomyelitis of lumbar spine being treated with IV vanc and doxcycline. Pt reports pain medication is effective but has to take it every 4 hours routinely and does like for it to be late when he is asking for it or the pain is not controlled. conts to work with therapy team. Muscle relaxer was not effective.  Review of Systems:  Review of Systems  Constitutional: Negative for activity change, appetite change, fatigue and unexpected weight change.  HENT: Negative for congestion and hearing loss.   Eyes: Negative.   Respiratory: Negative for cough and shortness of breath.   Cardiovascular: Negative for chest pain, palpitations and leg swelling.  Gastrointestinal: Negative for abdominal pain, diarrhea and constipation.  Genitourinary: Negative for dysuria and difficulty urinating.  Musculoskeletal: Positive for back pain (lumbar pain). Negative for myalgias and arthralgias.  Skin: Negative for color change and wound.  Neurological: Negative for dizziness and weakness.  Psychiatric/Behavioral: Negative for behavioral problems, confusion and agitation.    Past Medical History  Diagnosis Date  . Hyperlipidemia   . Gout   . Glaucoma     "high normal"  . Diabetes mellitus without complication  ([place])   . Myocardial infarction [facility])     "told me I had a small heart attack"  . GERD (gastroesophageal reflux disease)   . Cancer [facility]) 2010    prostate  . Arthritis   . H/O blood clots     R Lower leg, right upper leg  . Coronary artery disease   . Low back pain     "severe"  . Polymyalgia ([place])     h/o  . Osteoporosis   . Osteopenia   . Obesity   . Azotemia   . Thrombophlebitis   . Tinnitus   . Joint pain   . Heel spur   . Spinal stenosis   . Renal cyst, left   . Shingles     LEFT ABDOMEN  . Tubular adenoma   . Lesion of left native kidney   . Mild aortic stenosis   . Chondromalacia    Past Surgical History  Procedure Laterality Date  . Prostate surgery      seed implant  . Coronary artery bypass graft  2007  . Vein ligation and stripping Right 1972    right lower leg  . Appendectomy  60 years ago  . Cardiac catheterization    . Robotic assited partial nephrectomy Left 08/31/2013    Procedure: ROBOTIC ASSITED PARTIAL NEPHRECTOMY;  Surgeon: Dutch Gray, MD;  Location: [facility];  Service: Urology;  Laterality: Left;   Social History:   reports that he quit smoking about 34 years ago. His smoking use  included Cigarettes. He has a 90 pack-year smoking history. He has never used smokeless tobacco. He reports that he does not drink alcohol or use illicit drugs.  Family History  Problem Relation Age of Onset  . Heart Problems Mother   . Heart Problems Father     Medications: Patient's Medications  New Prescriptions   No medications on file  Previous Medications   ACETAMINOPHEN (TYLENOL) 325 MG TABLET    Take 1 tablet (325 mg total) by mouth every 6 (six) hours as needed for pain (1-2 tablets q 6 hrs prn pain).   ALENDRONATE (FOSAMAX) 70 MG TABLET    Take 70 mg by mouth daily. Take with a full glass of water on an empty stomach. Patient takes this medication once weekly on Saturday.   ALLOPURINOL (ZYLOPRIM) 300 MG TABLET    Take 300 mg by mouth daily.   DOXYCYCLINE  (VIBRAMYCIN) 100 MG CAPSULE    Take 100 mg by mouth 2 (two) times daily.   GLIPIZIDE (GLUCOTROL) 10 MG TABLET    Take 10 mg by mouth 2 (two) times daily before a meal.   HYDROCODONE-ACETAMINOPHEN (NORCO) 10-325 MG TABLET    Take one to two tablets by mouth every 4 hours as needed for pain. Hold for sedation. Do not exceed 4gm of Tylenol in 24 hours   ROSUVASTATIN (CRESTOR) 5 MG TABLET    Take 5 mg by mouth daily.   TAMSULOSIN (FLOMAX) 0.4 MG CAPS CAPSULE    Take 0.4 mg by mouth daily.   VANCOMYCIN 1,000 MG IN SODIUM CHLORIDE 0.9 % 250 ML    Inject 1,000 mg into the vein every 12 (twelve) hours. Adjusted per pharmacy  Modified Medications   No medications on file  Discontinued Medications   No medications on file     Physical Exam: Filed Vitals:   12/09/15 1544  BP: 123/68  Pulse: 74  Temp: 97.4 F (36.3 C)  Resp: 20    Physical Exam  Constitutional: He is oriented to person, place, and time. He appears well-developed and well-nourished. No distress.  HENT:  Head: Normocephalic and atraumatic.  Mouth/Throat: Oropharynx is clear and moist. No oropharyngeal exudate.  Eyes: Conjunctivae and EOM are normal. Pupils are equal, round, and reactive to light.  Neck: Normal range of motion. Neck supple.  Cardiovascular: Normal rate, regular rhythm and normal heart sounds.   Pulmonary/Chest: Effort normal and breath sounds normal.  Abdominal: Soft. Bowel sounds are normal.  Musculoskeletal: He exhibits tenderness (to lumber spine and paraspinal tenderness to lumbar area, MAE X4). He exhibits no edema.  Neurological: He is alert and oriented to person, place, and time.  Skin: Skin is warm and dry. He is not diaphoretic.  Psychiatric: He has a normal mood and affect.    Labs reviewed: Basic Metabolic Panel:  Recent Labs  10/28/15 0312 10/29/15 0207 10/30/15 0317  NA 134* 133* 132*  K 4.5 4.5 3.9  CL 101 99* 95*  CO2 25 27 26   GLUCOSE 139* 159* 230*  BUN 19 18 18   CREATININE  1.75* 1.77* 1.65*  CALCIUM 8.2* 8.4* 8.5*   Liver Function Tests:  Recent Labs  10/25/15 2330  AST 26  ALT 17  ALKPHOS 85  BILITOT 1.1  PROT 6.9  ALBUMIN 2.7*    Recent Labs  10/25/15 2330  LIPASE 25   No results for input(s): AMMONIA in the last 8760 hours. CBC:  Recent Labs  10/25/15 2330  10/28/15 0312 10/29/15 0207 10/30/15 [id number]  WBC 17.9*  < > 4.9 5.5 7.0  NEUTROABS 16.6*  --   --   --   --   HGB 12.4*  < > 9.5* 10.0* 9.8*  HCT 37.0*  < > 29.4* 30.8* 30.3*  MCV 95.6  < > 97.0 96.0 95.3  PLT 260  < > 147* 140* 161  < > = values in this interval not displayed. TSH: No results for input(s): TSH in the last 8760 hours. A1C: Lab Results  Component Value Date   HGBA1C 8.1* 10/26/2015   Lipid Panel: No results for input(s): CHOL, HDL, LDLCALC, TRIG, CHOLHDL, LDLDIRECT in the last 8760 hours.    Assessment/Plan 1. Intervertebral lumbar disc disorder with myelopathy, lumbar region Ongoing low back pain, reports needing pain medication routinely every 4 hours. Offered long acting twice daily medication with breakthrough medication as needed however he is unsure if he wants this at this time. Pain is chronic and would benefit from long term follow up. Will refer to pain clinic at this time.   2. Discitis of lumbar region Found on MRI per neurosurgery, conts on vanc and doxycyline for a total of 6 weeks.   30 mins Time TOTAL:  time greater than 50% of total time spent doing pt counseled and coordination of care regarding back and and education and plan of care with pt and staff     Kevaughn Ewing K. Harle Battiest  [facility] [phone] 8 am - 5 pm) [phone] (after hours)

## 2015-12-21 ENCOUNTER — Encounter (HOSPITAL_COMMUNITY): Payer: Self-pay | Admitting: Emergency Medicine

## 2015-12-21 ENCOUNTER — Observation Stay (HOSPITAL_COMMUNITY)
Admission: EM | Admit: 2015-12-21 | Discharge: 2015-12-24 | Disposition: A | Discharge status: Skilled Nursing Facility | Payer: Medicare Other | Attending: Internal Medicine | Admitting: Internal Medicine

## 2015-12-21 DIAGNOSIS — R000 Tachycardia, unspecified: Secondary | ICD-10-CM

## 2015-12-21 DIAGNOSIS — E119 Type 2 diabetes mellitus without complications: Secondary | ICD-10-CM | POA: Diagnosis not present

## 2015-12-21 DIAGNOSIS — I2510 Atherosclerotic heart disease of native coronary artery without angina pectoris: Secondary | ICD-10-CM | POA: Diagnosis not present

## 2015-12-21 DIAGNOSIS — N3080 Other cystitis without hematuria: Secondary | ICD-10-CM

## 2015-12-21 DIAGNOSIS — R1110 Vomiting, unspecified: Secondary | ICD-10-CM | POA: Diagnosis present

## 2015-12-21 DIAGNOSIS — Z7984 Long term (current) use of oral hypoglycemic drugs: Secondary | ICD-10-CM | POA: Insufficient documentation

## 2015-12-21 DIAGNOSIS — I10 Essential (primary) hypertension: Secondary | ICD-10-CM | POA: Diagnosis present

## 2015-12-21 DIAGNOSIS — Z85528 Personal history of other malignant neoplasm of kidney: Secondary | ICD-10-CM | POA: Insufficient documentation

## 2015-12-21 DIAGNOSIS — M353 Polymyalgia rheumatica: Secondary | ICD-10-CM

## 2015-12-21 DIAGNOSIS — Z792 Long term (current) use of antibiotics: Secondary | ICD-10-CM | POA: Insufficient documentation

## 2015-12-21 DIAGNOSIS — N183 Chronic kidney disease, stage 3 unspecified: Secondary | ICD-10-CM

## 2015-12-21 DIAGNOSIS — D72829 Elevated white blood cell count, unspecified: Secondary | ICD-10-CM

## 2015-12-21 DIAGNOSIS — Z6827 Body mass index (BMI) 27.0-27.9, adult: Secondary | ICD-10-CM | POA: Insufficient documentation

## 2015-12-21 DIAGNOSIS — Z951 Presence of aortocoronary bypass graft: Secondary | ICD-10-CM | POA: Insufficient documentation

## 2015-12-21 DIAGNOSIS — R197 Diarrhea, unspecified: Secondary | ICD-10-CM

## 2015-12-21 DIAGNOSIS — Z905 Acquired absence of kidney: Secondary | ICD-10-CM | POA: Insufficient documentation

## 2015-12-21 DIAGNOSIS — T40605A Adverse effect of unspecified narcotics, initial encounter: Secondary | ICD-10-CM | POA: Insufficient documentation

## 2015-12-21 DIAGNOSIS — I129 Hypertensive chronic kidney disease with stage 1 through stage 4 chronic kidney disease, or unspecified chronic kidney disease: Secondary | ICD-10-CM | POA: Diagnosis not present

## 2015-12-21 DIAGNOSIS — Z8546 Personal history of malignant neoplasm of prostate: Secondary | ICD-10-CM | POA: Insufficient documentation

## 2015-12-21 DIAGNOSIS — C801 Malignant (primary) neoplasm, unspecified: Secondary | ICD-10-CM

## 2015-12-21 DIAGNOSIS — M4640 Discitis, unspecified, site unspecified: Secondary | ICD-10-CM | POA: Insufficient documentation

## 2015-12-21 DIAGNOSIS — G253 Myoclonus: Secondary | ICD-10-CM | POA: Diagnosis not present

## 2015-12-21 DIAGNOSIS — Z7983 Long term (current) use of bisphosphonates: Secondary | ICD-10-CM | POA: Insufficient documentation

## 2015-12-21 DIAGNOSIS — E785 Hyperlipidemia, unspecified: Secondary | ICD-10-CM

## 2015-12-21 DIAGNOSIS — M109 Gout, unspecified: Secondary | ICD-10-CM | POA: Diagnosis not present

## 2015-12-21 DIAGNOSIS — Z86718 Personal history of other venous thrombosis and embolism: Secondary | ICD-10-CM

## 2015-12-21 DIAGNOSIS — M1990 Unspecified osteoarthritis, unspecified site: Secondary | ICD-10-CM

## 2015-12-21 DIAGNOSIS — R252 Cramp and spasm: Secondary | ICD-10-CM

## 2015-12-21 DIAGNOSIS — K219 Gastro-esophageal reflux disease without esophagitis: Secondary | ICD-10-CM | POA: Diagnosis not present

## 2015-12-21 DIAGNOSIS — E669 Obesity, unspecified: Secondary | ICD-10-CM | POA: Diagnosis not present

## 2015-12-21 DIAGNOSIS — R634 Abnormal weight loss: Secondary | ICD-10-CM | POA: Insufficient documentation

## 2015-12-21 DIAGNOSIS — H409 Unspecified glaucoma: Secondary | ICD-10-CM | POA: Diagnosis not present

## 2015-12-21 DIAGNOSIS — I25810 Atherosclerosis of coronary artery bypass graft(s) without angina pectoris: Secondary | ICD-10-CM

## 2015-12-21 DIAGNOSIS — Z923 Personal history of irradiation: Secondary | ICD-10-CM | POA: Insufficient documentation

## 2015-12-21 DIAGNOSIS — K5903 Drug induced constipation: Secondary | ICD-10-CM | POA: Insufficient documentation

## 2015-12-21 DIAGNOSIS — E871 Hypo-osmolality and hyponatremia: Secondary | ICD-10-CM

## 2015-12-21 DIAGNOSIS — Z79899 Other long term (current) drug therapy: Secondary | ICD-10-CM | POA: Insufficient documentation

## 2015-12-21 DIAGNOSIS — I252 Old myocardial infarction: Secondary | ICD-10-CM | POA: Insufficient documentation

## 2015-12-21 DIAGNOSIS — Z87891 Personal history of nicotine dependence: Secondary | ICD-10-CM | POA: Insufficient documentation

## 2015-12-21 DIAGNOSIS — E875 Hyperkalemia: Secondary | ICD-10-CM

## 2015-12-21 HISTORY — DX: Essential (primary) hypertension: I10

## 2015-12-21 NOTE — ED Provider Notes (Addendum)
CSN: [id number]     Arrival date & time 12/21/15  2342 History  By signing my name below, I, Sonum Patel, attest that this documentation has been prepared under the direction and in the presence of Orpah Greek, MD. Electronically Signed: Sonum Patel, Education administrator. 12/21/2015. 11:57 PM.    Chief Complaint  Patient presents with  . Back Pain   The history is provided by the patient. No language interpreter was used.     HPI Comments: [name] is a 81 y.o. male who presents to the Emergency Department complaining of constant, unchanged generalized discomfort and feeling fidgety that began earlier this evening. Per EMS, patient had had lower back pain for the past 2 weeks and has been taking a couple of medications for this after having lower back surgery in October 2016. He denies rash, itching.  Past Medical History  Diagnosis Date  . Hyperlipidemia   . Gout   . Glaucoma     "high normal"  . Diabetes mellitus without complication ([place])   . Myocardial infarction [facility])     "told me I had a small heart attack"  . GERD (gastroesophageal reflux disease)   . Cancer [facility]) 2010    prostate  . Arthritis   . H/O blood clots     R Lower leg, right upper leg  . Coronary artery disease   . Low back pain     "severe"  . Polymyalgia ([place])     h/o  . Osteoporosis   . Osteopenia   . Obesity   . Azotemia   . Thrombophlebitis   . Tinnitus   . Joint pain   . Heel spur   . Spinal stenosis   . Renal cyst, left   . Shingles     LEFT ABDOMEN  . Tubular adenoma   . Lesion of left native kidney   . Mild aortic stenosis   . Chondromalacia    Past Surgical History  Procedure Laterality Date  . Prostate surgery      seed implant  . Coronary artery bypass graft  2007  . Vein ligation and stripping Right 1972    right lower leg  . Appendectomy  60 years ago  . Cardiac catheterization    . Robotic assited partial nephrectomy Left 08/31/2013    Procedure: ROBOTIC ASSITED PARTIAL  NEPHRECTOMY;  Surgeon: Dutch Gray, MD;  Location: [facility];  Service: Urology;  Laterality: Left;   Family History  Problem Relation Age of Onset  . Heart Problems Mother   . Heart Problems Father    Social History  Substance Use Topics  . Smoking status: Former Smoker -- 3.00 packs/day for 30 years    Types: Cigarettes    Quit date: 12/21/1980  . Smokeless tobacco: Never Used  . Alcohol Use: No    Review of Systems  Musculoskeletal: Positive for back pain.  Neurological: Positive for tremors.  All other systems reviewed and are negative.     Allergies  Adacel; Lipitor; Penicillins; Septra; and Simvastatin  Home Medications   Prior to Admission medications   Medication Sig Start Date End Date Taking? Authorizing Provider  acetaminophen (TYLENOL) 325 MG tablet Take 1 tablet (325 mg total) by mouth every 6 (six) hours as needed for pain (1-2 tablets q 6 hrs prn pain). 09/03/13   Festus Aloe, MD  alendronate (FOSAMAX) 70 MG tablet Take 70 mg by mouth daily. Take with a full glass of water on an empty stomach. Patient takes  this medication once weekly on Saturday.    Historical Provider, MD  allopurinol (ZYLOPRIM) 300 MG tablet Take 300 mg by mouth daily. 02/07/15   Historical Provider, MD  doxycycline (VIBRAMYCIN) 100 MG capsule Take 100 mg by mouth 2 (two) times daily. 11/22/15 01/03/16  Historical Provider, MD  glipiZIDE (GLUCOTROL) 10 MG tablet Take 10 mg by mouth 2 (two) times daily before a meal.    Historical Provider, MD  HYDROcodone-acetaminophen (NORCO) 10-325 MG tablet Take one to two tablets by mouth every 4 hours as needed for pain. Hold for sedation. Do not exceed 4gm of Tylenol in 24 hours 11/18/15   Tiffany L Reed, DO  rosuvastatin (CRESTOR) 5 MG tablet Take 5 mg by mouth daily. 09/28/15   Historical Provider, MD  tamsulosin (FLOMAX) 0.4 MG CAPS capsule Take 0.4 mg by mouth daily. 01/18/15   Historical Provider, MD  vancomycin 1,000 mg in sodium chloride 0.9 % 250 mL  Inject 1,000 mg into the vein every 12 (twelve) hours. Adjusted per pharmacy 11/22/15 01/03/16  Historical Provider, MD   There were no vitals taken for this visit. Physical Exam  Constitutional: He is oriented to person, place, and time. He appears well-developed and well-nourished. No distress.  HENT:  Head: Normocephalic and atraumatic.  Right Ear: Hearing normal.  Left Ear: Hearing normal.  Nose: Nose normal.  Mouth/Throat: Oropharynx is clear and moist and mucous membranes are normal.  Eyes: Conjunctivae and EOM are normal. Pupils are equal, round, and reactive to light.  Neck: Normal range of motion. Neck supple.  Cardiovascular: Regular rhythm, S1 normal and S2 normal.  Exam reveals no gallop and no friction rub.   No murmur heard. Pulmonary/Chest: Effort normal and breath sounds normal. No respiratory distress. He exhibits no tenderness.  Abdominal: Soft. Normal appearance and bowel sounds are normal. There is no hepatosplenomegaly. There is no tenderness. There is no rebound, no guarding, no tenderness at McBurney's point and negative Murphy's sign. No hernia.  Musculoskeletal: Normal range of motion.  Neurological: He is alert and oriented to person, place, and time. He has normal strength. He displays tremor. No cranial nerve deficit or sensory deficit. Coordination normal. GCS eye subscore is 4. GCS verbal subscore is 5. GCS motor subscore is 6.  intermittent twitching of extremities and flexing of muscles.   Skin: Skin is warm, dry and intact. No rash noted. No cyanosis.  Psychiatric: He has a normal mood and affect. His speech is normal and behavior is normal. Thought content normal.  Nursing note and vitals reviewed.   ED Course  Procedures (including critical care time)  DIAGNOSTIC STUDIES: Oxygen Saturation is 97% on RA, adequate by my interpretation.    COORDINATION OF CARE: 12:06 AM Discussed treatment plan with pt at bedside and pt agreed to plan.   Labs  Review Labs Reviewed - No data to display  Imaging Review No results found. I have personally reviewed and evaluated these lab results as part of my medical decision-making.   EKG Interpretation None      MDM   Final diagnoses:  None   myoclonus  Patient presents to the ER for evaluation of intermittent episodes of twitching of the muscles of the extremities. Patient is exhibiting intermittent myoclonus type jerking motions of the extremities. All 4 extremities are equally involved. Etiology is unclear. Patient is not on any medications that would cause this, although he does state that she recently was started on doxycycline. He does have a history of discitis  for which he has been receiving IV vancomycin and now doxycycline. I do not feel that there is any sign of allergic reaction present.  Patient was anxious and slightly agitated. Patient was administered Benadryl and did not have any significant improvement. He was then administered Ativan and continued to have intermittent twitching episodes. This does not resemble a seizure. He is alert this is occurring and it is bilateral and all 4 extremities at the same time. Etiology is unclear, but his workup is unremarkable at this time.  Patient has become more agitated and confused after the medications. It is felt that this is medication effect, secondary to adverse reaction to Benadryl and Ativan, likely age-related.  He does have recent diagnosis of discitis, but he is not having any fever. Blood counts are normal. Patient has normal strength and sensation in lower extremities, pain in his back is unchanged. Tubes involve the upper extremities as well as the lower extremities. This is not consistent with a lumbar lesion of any sort.  At this point, myoclonus events are of unclear etiology, but he does not appear to have any significant medical abnormality that requires further intervention at this time and can return to the nursing  home.  I personally performed the services described in this documentation, which was scribed in my presence. The recorded information has been reviewed and is accurate.   Orpah Greek, MD 12/22/15 [id number]  Orpah Greek, MD 12/22/15 [phone]

## 2015-12-21 NOTE — ED Notes (Signed)
Pt arrives via EMS from [facility], hx of back surgery and cyst on disc. Pt has been feeling figity for two weeks but worse tonight.

## 2015-12-22 DIAGNOSIS — I25810 Atherosclerosis of coronary artery bypass graft(s) without angina pectoris: Secondary | ICD-10-CM | POA: Diagnosis not present

## 2015-12-22 DIAGNOSIS — R1110 Vomiting, unspecified: Secondary | ICD-10-CM | POA: Diagnosis present

## 2015-12-22 DIAGNOSIS — M4640 Discitis, unspecified, site unspecified: Secondary | ICD-10-CM | POA: Diagnosis present

## 2015-12-22 DIAGNOSIS — N183 Chronic kidney disease, stage 3 unspecified: Secondary | ICD-10-CM

## 2015-12-22 DIAGNOSIS — R252 Cramp and spasm: Secondary | ICD-10-CM

## 2015-12-22 DIAGNOSIS — R197 Diarrhea, unspecified: Secondary | ICD-10-CM | POA: Diagnosis not present

## 2015-12-22 DIAGNOSIS — E119 Type 2 diabetes mellitus without complications: Secondary | ICD-10-CM | POA: Diagnosis not present

## 2015-12-22 DIAGNOSIS — R000 Tachycardia, unspecified: Secondary | ICD-10-CM

## 2015-12-22 HISTORY — DX: Chronic kidney disease, stage 3 unspecified: N18.30

## 2015-12-22 LAB — URINALYSIS, ROUTINE W REFLEX MICROSCOPIC
BILIRUBIN URINE: NEGATIVE
Glucose, UA: NEGATIVE mg/dL
HGB URINE DIPSTICK: NEGATIVE
Ketones, ur: NEGATIVE mg/dL
Leukocytes, UA: NEGATIVE
Nitrite: NEGATIVE
PROTEIN: NEGATIVE mg/dL
SPECIFIC GRAVITY, URINE: 1.017 (ref 1.005–1.030)
pH: 6 (ref 5.0–8.0)

## 2015-12-22 LAB — GASTROINTESTINAL PANEL BY PCR, STOOL (REPLACES STOOL CULTURE)

## 2015-12-22 LAB — BASIC METABOLIC PANEL
Anion gap: 12 (ref 5–15)
BUN: 9 mg/dL (ref 6–20)
CALCIUM: 10.1 mg/dL (ref 8.9–10.3)
CO2: 24 mmol/L (ref 22–32)
CREATININE: 1.51 mg/dL — AB (ref 0.61–1.24)
Chloride: 105 mmol/L (ref 101–111)
GFR, EST AFRICAN AMERICAN: 49 mL/min — AB (ref >60)
GFR, EST NON AFRICAN AMERICAN: 42 mL/min — AB (ref >60)
GLUCOSE: 93 mg/dL (ref 65–99)
Potassium: 4.1 mmol/L (ref 3.5–5.1)
Sodium: 141 mmol/L (ref 135–145)

## 2015-12-22 LAB — CBC WITH DIFFERENTIAL/PLATELET
BASOS PCT: 1 %
Basophils Absolute: 0 10*3/uL (ref 0.0–0.1)
EOS ABS: 0.2 10*3/uL (ref 0.0–0.7)
Eosinophils Relative: 3 %
HEMATOCRIT: 31.9 % — AB (ref 39.0–52.0)
Hemoglobin: 10.4 g/dL — ABNORMAL LOW (ref 13.0–17.0)
LYMPHS ABS: 3 10*3/uL (ref 0.7–4.0)
Lymphocytes Relative: 35 %
MCH: 31.8 pg (ref 26.0–34.0)
MCHC: 32.6 g/dL (ref 30.0–36.0)
MCV: 97.6 fL (ref 78.0–100.0)
MONO ABS: 0.7 10*3/uL (ref 0.1–1.0)
MONOS PCT: 9 %
NEUTROS ABS: 4.6 10*3/uL (ref 1.7–7.7)
Neutrophils Relative %: 52 %
Platelets: 303 10*3/uL (ref 150–400)
RBC: 3.27 MIL/uL — ABNORMAL LOW (ref 4.22–5.81)
RDW: 18 % — AB (ref 11.5–15.5)
WBC: 8.6 10*3/uL (ref 4.0–10.5)

## 2015-12-22 LAB — GLUCOSE, CAPILLARY
GLUCOSE-CAPILLARY: 148 mg/dL — AB (ref 65–99)
Glucose-Capillary: 137 mg/dL — ABNORMAL HIGH (ref 65–99)

## 2015-12-22 LAB — C DIFFICILE QUICK SCREEN W PCR REFLEX
C DIFFICILE (CDIFF) INTERP: NEGATIVE
C Diff antigen: NEGATIVE
C Diff toxin: NEGATIVE

## 2015-12-22 LAB — VANCOMYCIN, RANDOM: Vancomycin Rm: 18 ug/mL

## 2015-12-22 LAB — I-STAT CG4 LACTIC ACID, ED: Lactic Acid, Venous: 1.36 mmol/L (ref 0.5–2.0)

## 2015-12-22 MED ORDER — ONDANSETRON HCL 4 MG/2ML IJ SOLN
4.0000 mg | Freq: Once | INTRAMUSCULAR | Status: AC
  Administered 2015-12-22: 4 mg via INTRAVENOUS
  Filled 2015-12-22: qty 2

## 2015-12-22 MED ORDER — SODIUM CHLORIDE 0.9 % IJ SOLN
10.0000 mL | INTRAMUSCULAR | Status: DC | PRN
  Administered 2015-12-24: 10 mL
  Filled 2015-12-22: qty 40

## 2015-12-22 MED ORDER — ONDANSETRON HCL 4 MG/2ML IJ SOLN
4.0000 mg | Freq: Four times a day (QID) | INTRAMUSCULAR | Status: DC | PRN
  Administered 2015-12-22: 4 mg via INTRAVENOUS
  Filled 2015-12-22: qty 2

## 2015-12-22 MED ORDER — HYDROCODONE-ACETAMINOPHEN 5-325 MG PO TABS
1.0000 | ORAL_TABLET | Freq: Once | ORAL | Status: AC
Start: 2015-12-22 — End: 2015-12-22
  Administered 2015-12-22: 1 via ORAL
  Filled 2015-12-22: qty 1

## 2015-12-22 MED ORDER — SODIUM CHLORIDE 0.9 % IV SOLN
INTRAVENOUS | Status: DC
  Administered 2015-12-22: 16:00:00 via INTRAVENOUS

## 2015-12-22 MED ORDER — HYDROCODONE-ACETAMINOPHEN 10-325 MG PO TABS
1.0000 | ORAL_TABLET | Freq: Four times a day (QID) | ORAL | Status: DC | PRN
  Administered 2015-12-22 – 2015-12-24 (×5): 2 via ORAL
  Filled 2015-12-22 (×6): qty 2

## 2015-12-22 MED ORDER — DOXYCYCLINE HYCLATE 100 MG PO TABS
100.0000 mg | ORAL_TABLET | Freq: Two times a day (BID) | ORAL | Status: DC
  Administered 2015-12-22 – 2015-12-24 (×4): 100 mg via ORAL
  Filled 2015-12-22 (×3): qty 1

## 2015-12-22 MED ORDER — SODIUM CHLORIDE 0.9 % IV BOLUS (SEPSIS)
1000.0000 mL | Freq: Once | INTRAVENOUS | Status: AC
  Administered 2015-12-22: 1000 mL via INTRAVENOUS

## 2015-12-22 MED ORDER — DIPHENHYDRAMINE HCL 25 MG PO CAPS
50.0000 mg | ORAL_CAPSULE | Freq: Once | ORAL | Status: AC
  Administered 2015-12-22: 50 mg via ORAL
  Filled 2015-12-22: qty 2

## 2015-12-22 MED ORDER — ENOXAPARIN SODIUM 40 MG/0.4ML ~~LOC~~ SOLN
40.0000 mg | SUBCUTANEOUS | Status: DC
  Administered 2015-12-22 – 2015-12-23 (×2): 40 mg via SUBCUTANEOUS
  Filled 2015-12-22 (×2): qty 0.4

## 2015-12-22 MED ORDER — LORAZEPAM 1 MG PO TABS
0.5000 mg | ORAL_TABLET | Freq: Once | ORAL | Status: AC
  Administered 2015-12-22: 0.5 mg via ORAL
  Filled 2015-12-22: qty 1

## 2015-12-22 MED ORDER — VANCOMYCIN HCL IN DEXTROSE 1-5 GM/200ML-% IV SOLN
1000.0000 mg | INTRAVENOUS | Status: DC
  Administered 2015-12-22 – 2015-12-23 (×2): 1000 mg via INTRAVENOUS
  Filled 2015-12-22 (×3): qty 200

## 2015-12-22 MED ORDER — INSULIN ASPART 100 UNIT/ML ~~LOC~~ SOLN
0.0000 [IU] | Freq: Three times a day (TID) | SUBCUTANEOUS | Status: DC
  Administered 2015-12-23 (×2): 1 [IU] via SUBCUTANEOUS

## 2015-12-22 MED ORDER — TAMSULOSIN HCL 0.4 MG PO CAPS
0.4000 mg | ORAL_CAPSULE | Freq: Every day | ORAL | Status: DC
  Administered 2015-12-22 – 2015-12-23 (×2): 0.4 mg via ORAL
  Filled 2015-12-22 (×2): qty 1

## 2015-12-22 MED ORDER — SODIUM CHLORIDE 0.9 % IJ SOLN
3.0000 mL | Freq: Two times a day (BID) | INTRAMUSCULAR | Status: DC
  Administered 2015-12-22 – 2015-12-24 (×5): 3 mL via INTRAVENOUS

## 2015-12-22 MED ORDER — ALLOPURINOL 300 MG PO TABS
300.0000 mg | ORAL_TABLET | Freq: Every day | ORAL | Status: DC
  Administered 2015-12-22 – 2015-12-24 (×3): 300 mg via ORAL
  Filled 2015-12-22 (×2): qty 1

## 2015-12-22 MED ORDER — ROSUVASTATIN CALCIUM 5 MG PO TABS
5.0000 mg | ORAL_TABLET | Freq: Every day | ORAL | Status: DC
  Administered 2015-12-22 – 2015-12-23 (×2): 5 mg via ORAL
  Filled 2015-12-22 (×3): qty 1

## 2015-12-22 MED ORDER — ONDANSETRON HCL 4 MG PO TABS: 4.0000 mg | ORAL_TABLET | Freq: Four times a day (QID) | ORAL | Status: DC | PRN

## 2015-12-22 MED ORDER — ALTEPLASE 2 MG IJ SOLR
2.0000 mg | Freq: Once | INTRAMUSCULAR | Status: AC
  Administered 2015-12-22: 2 mg
  Filled 2015-12-22: qty 2

## 2015-12-22 NOTE — ED Notes (Signed)
Attempted to draw labs off PICC, unsuccessful

## 2015-12-22 NOTE — Discharge Instructions (Signed)
°

## 2015-12-22 NOTE — ED Notes (Signed)
Placed order for patient to have PICC Checked. Pt to have fluid bolus after checked

## 2015-12-22 NOTE — ED Provider Notes (Addendum)
Patient was seen by previous provider late last night with plan for discharge. His ED workup was fairly unremarkable. Some renal impairment but this appears to be close to baseline.   He is noted to be persistently tachycardic though. Prior to being able to arrange transport patient has had 5 diarrheal stools and is also vomited. He remains tachycardic after a liter of IV fluids. We'll additionally check a lactic acid. We'll check for C. difficile. At this point I feel it would be more prudent to admit him for observation.    Virgel Manifold, MD 12/22/15 [place], MD 12/22/15 1159

## 2015-12-22 NOTE — ED Notes (Signed)
Received care of this patient at this time

## 2015-12-22 NOTE — ED Notes (Signed)
Spoke with Pollina MD about pts HR. Stated that he was approved to be discharged at [facility]. MD made aware that patient is confused. Pt states he is 82 yrs old and he is at [employer]. Pt is alert and oriented to Person, situation, and States that it is New Years Day. MD approved confusion was medication induced and he was clear to go back to [facility] with this. MD asked for this RN to make sure PICC line flushed before sending back.

## 2015-12-22 NOTE — H&P (Signed)
[facility] History and Physical  [name] [id number] DOB: [birth year] DOA: 12/21/2015  Referring physician: Emergency Department PCP: Mathews Argyle, MD   CHIEF COMPLAINT:   Vomiting and diarrhea       HPI: [name] is a 82 y.o. male with multiple medical problems.   Patient brought in from [facility] last night with jerking of his upper and lower extremities. Symptoms resolved, seizure felt to be unlikely. Patient was waiting for transportation back to [facility] when he developed several episodes of diarrhea and vomiting. He's been tachycardic since arrival to the emergency department. Patient has chronic constipation secondary to narcotics. He takes MiraLAX and laxatives as needed. Patient took MiraLAX yesterday before coming to the emergency department. Wife feels the amount of diarrhea is out of proportion to the MiraLAX dose. Patient had episode of vomiting yesterday, he vomited once in the emergency department this morning. He endorses some fleeting mid lower abdominal pain this morning.   Patient has a PICC line in his right upper extremity or he has been receiving antibiotics for discitis.    ED COURSE:   Fluid bolus x1  Labs:   Electrolytes normal, BUN 9, creatinine 1.5, lactic acid 1.36 Normal white count, hemoglobin 10 point 4                Medications  ondansetron (ZOFRAN) injection 4 mg (not administered)  diphenhydrAMINE (BENADRYL) capsule 50 mg (50 mg Oral Given 12/22/15 0024)  LORazepam (ATIVAN) tablet 0.5 mg (0.5 mg Oral Given 12/22/15 0343)  HYDROcodone-acetaminophen (NORCO/VICODIN) 5-325 MG per tablet 1 tablet (1 tablet Oral Given 12/22/15 0342)  alteplase (CATHFLO ACTIVASE) injection 2 mg (2 mg Intracatheter Given 12/22/15 1111)  sodium chloride 0.9 % bolus 1,000 mL (0 mLs Intravenous Stopped 12/22/15 1108)    Review of Systems  Constitutional: Positive for weight loss.  HENT: Negative.   Eyes: Negative.   Respiratory: Negative.     Cardiovascular: Negative.   Gastrointestinal: Positive for nausea, vomiting and diarrhea.  Genitourinary: Negative.   Musculoskeletal: Positive for back pain.  Skin: Negative.   Neurological: Negative.   Endo/Heme/Allergies: Negative.   Psychiatric/Behavioral: Negative.    Past Medical History  Diagnosis Date  . Hyperlipidemia   . Gout   . Glaucoma     "high normal"  . Diabetes mellitus without complication ([place])   . Myocardial infarction [facility])     "told me I had a small heart attack"  . GERD (gastroesophageal reflux disease)   . Cancer [facility]) 2010    prostate  . Arthritis   . H/O blood clots     R Lower leg, right upper leg  . Coronary artery disease   . Low back pain     "severe"  . Polymyalgia ([place])     h/o  . Osteoporosis   . Osteopenia   . Obesity   . Azotemia   . Thrombophlebitis   . Tinnitus   . Joint pain   . Heel spur   . Spinal stenosis   . Renal cyst, left   . Shingles     LEFT ABDOMEN  . Tubular adenoma   . Lesion of left native kidney   . Mild aortic stenosis   . Chondromalacia    Past Surgical History  Procedure Laterality Date  . Prostate surgery      seed implant  . Coronary artery bypass graft  2007  . Vein ligation and stripping Right 1972    right lower leg  .  Appendectomy  60 years ago  . Cardiac catheterization    . Robotic assited partial nephrectomy Left 08/31/2013    Procedure: ROBOTIC ASSITED PARTIAL NEPHRECTOMY;  Surgeon: Dutch Gray, MD;  Location: [facility] ORS;  Service: Urology;  Laterality: Left;    SOCIAL HISTORY:  reports that he quit smoking about 35 years ago. His smoking use included Cigarettes. He has a 90 pack-year smoking history. He has never used smokeless tobacco. He reports that he does not drink alcohol or use illicit drugs. Lives: at [employer] devices:   Gilford Rile needed for ambulation.   Allergies  Allergen Reactions  . Adacel [Diphth-Acell Pertussis-Tetanus]   . Lipitor [Atorvastatin] Other (See  Comments)    Muscle pain  . Penicillins     UNKNOWN  . Septra [Sulfamethoxazole-Trimethoprim]   . Simvastatin     MUSCLE PAIN    Family History  Problem Relation Age of Onset  . Heart Problems Mother   . Heart Problems Father     Prior to Admission medications   Medication Sig Start Date End Date Taking? Authorizing Provider  acetaminophen (TYLENOL) 325 MG tablet Take 1 tablet (325 mg total) by mouth every 6 (six) hours as needed for pain (1-2 tablets q 6 hrs prn pain). Patient taking differently: Take 650 mg by mouth every 6 (six) hours as needed for moderate pain (1-2 tablets q 6 hrs prn pain).  09/03/13  Yes Festus Aloe, MD  alendronate (FOSAMAX) 70 MG tablet Take 70 mg by mouth daily. Take with a full glass of water on an empty stomach. Patient takes this medication once weekly on Saturday.   Yes Historical Provider, MD  allopurinol (ZYLOPRIM) 300 MG tablet Take 300 mg by mouth daily. 02/07/15  Yes Historical Provider, MD  doxycycline (VIBRAMYCIN) 100 MG capsule Take 100 mg by mouth 2 (two) times daily. 11/22/15 01/03/16 Yes Historical Provider, MD  glipiZIDE (GLUCOTROL) 10 MG tablet Take 10 mg by mouth 2 (two) times daily before a meal.   Yes Historical Provider, MD  HYDROcodone-acetaminophen (NORCO) 10-325 MG tablet Take one to two tablets by mouth every 4 hours as needed for pain. Hold for sedation. Do not exceed 4gm of Tylenol in 24 hours 11/18/15  Yes Tiffany L Reed, DO  LACTOBACILLUS PO Take 1 tablet by mouth daily.   Yes Historical Provider, MD  polyethylene glycol (MIRALAX / GLYCOLAX) packet Take 17 g by mouth daily.   Yes Historical Provider, MD  rosuvastatin (CRESTOR) 5 MG tablet Take 5 mg by mouth daily. 09/28/15  Yes Historical Provider, MD  senna-docusate (SENOKOT-S) 8.6-50 MG tablet Take 2 tablets by mouth at bedtime.   Yes Historical Provider, MD  tamsulosin (FLOMAX) 0.4 MG CAPS capsule Take 0.4 mg by mouth daily. 01/18/15  Yes Historical Provider, MD  vancomycin 500 mg  in sodium chloride 0.9 % 100 mL Inject 500 mg into the vein every 12 (twelve) hours. 12/18/15 01/03/16 Yes Historical Provider, MD   PHYSICAL EXAM: Filed Vitals:   12/22/15 1000 12/22/15 1030 12/22/15 1100 12/22/15 1200  BP: 115/85 127/80 107/60 121/80  Pulse: 117 110 118 115  Temp:      TempSrc:      Resp: 18   22  Height:      Weight:      SpO2: 99% 100% 99% 99%    Wt Readings from Last 3 Encounters:  12/22/15 79.833 kg (176 lb)  11/22/15 78.586 kg (173 lb 4 oz)  10/25/15 90.719 kg (200 lb)  General:  Pleasant white male. Appears calm and comfortable Eyes: PER, normal lids, irises & conjunctiva ENT: grossly normal hearing, lips & tongue Neck: no LAD, no masses Cardiovascular: sinus tach,  Mild RLE edema (ankle).  Respiratory: Respirations even and unlabored. Normal respiratory effort. Lungs CTA bilaterally, no wheezes / rales .   Abdomen: soft, non-distended, non-tender, active bowel sounds. No obvious masses.  Skin: no rash seen on limited exam Musculoskeletal: grossly normal tone BUE/BLE Psychiatric: grossly normal mood and affect, speech fluent and appropriate Neurologic: grossly non-focal.         LABS ON ADMISSION:    Basic Metabolic Panel:  Recent Labs Lab 12/22/15 0059  NA 141  K 4.1  CL 105  CO2 24  GLUCOSE 93  BUN 9  CREATININE 1.51*  CALCIUM 10.1   CBC:  Recent Labs Lab 12/22/15 0059  WBC 8.6  NEUTROABS 4.6  HGB 10.4*  HCT 31.9*  MCV 97.6  PLT 303    CREATININE: 1.51 mg/dL ABNORMAL (12/22/15 0059) Estimated creatinine clearance - 39.8 mL/min  ASSESSMENT / PLAN   Jerking movement of extremities, resolved.   Acute diarrhea, vomiting. Patient has chronic constipation secondary to narcotics. He took MiraLAX yesterday but per wife doesn't usually cause significant diarrhea. Even long-term antibiotic use need to exclude C. Difficile. Rule out other infectious etiology. -Admit to telemetry given tachycardia -Stools for C-diff ordered by  EDP. -Gentle hydration with IV fluids -Hold home MiraLAX and laxatives -IV antiemetics -PT evaluation  Sinus tachycardia -Monitor on telemetry  -IV fluids for gentle hydration. Received one liter bolus in ED, will start maintenance fluids of 31ml/hr now.   -EKG   Discitis following,lumbar surgery in October. On antibiotics via PICC under direction of Dr. Earnie Larsson. -continue home IV antibiotics via PICC -continue home pain medications  Weight loss, probably multifactorial (back surgery, recent admission for sepsis, unappetizing food at facility).   Diabetes, Type 2 -Hold home Glucotrol -Monitor CBG, start sliding scale insulin  Mild RLE swelling, chronic.   CKD, stage II  Cr 1.51, better than baseline -continue to monitor renal function.  Essential hypertension, stable.  -continue home medications. If vomiting and cannot tolerate then will give IV anti-hypertensives.   Gout -continue home zyloprima  History of prostate cancer, s/p radiation  Remote history of renal cancer, status post partial nephrectomy  CAD (coronary artery disease) of artery bypass graft    CONSULTANTS:   none  Code Status: full code DVT Prophylaxis: Lovenox  Family Communication:  Patient alert, oriented and understands plan of care.   Disposition Plan: Discharge to home in 24-48 hours   Time spent: 60 minutes Tye Savoy  NP [facility] Pager [phone]

## 2015-12-22 NOTE — Clinical Social Work Note (Signed)
Clinical Social Work Assessment  Patient Details  Name: [name] MRN: [id number] Date of Birth: 11/02/1934  Date of referral:  12/22/15               Reason for consult:  Facility Placement                Permission sought to share information with:  Case Manager, Family Supports Permission granted to share information::  Yes, Verbal Permission Granted  Name::     Rayborn Maat  Agency::     Relationship::  Wife  Contact Information:  [phone]  Housing/Transportation Living arrangements for the past 2 months:  [place] [employer] transport manager ) Source of Information:  Spouse Patient Interpreter Needed:  None Criminal Activity/Legal Involvement Pertinent to Current Situation/Hospitalization:  No - Comment as needed Significant Relationships:  Spouse Lives with:  Facility Resident Do you feel safe going back to the place where you live?  Yes Need for family participation in patient care:  Yes (Comment)  Care giving concerns:  Per wife, no concerns to be addressed at this time. Wife reports that there is "some virus going on at the facility, and she believes he has it".    Social Worker assessment / plan:  CSW went to speak with patient regarding discharge planning. CSW introduced self and acknowledged the patient. Patient spoke to [place], however did not speak during the assessment. Patient's wife Silva Bandy is at bedside. Patient provided CSW with permission to speak with wife regarding disposition. Wife has requested for patient to return back to [facility] once medically stable and ready for discharge. CSW informed patient and wife that CSW will contact facility to explore any concerns regarding the patient returning. CSW will then fax over requested patient information. CSW to complete FL2 for MD signature, and handoff for unit CSW.   CSW contacted facility representative Crystal from [facility] who voiced that there are no concerns with the patient returning back to the  facility once ready. Crystal was appreciative of the call provided by CSW regarding the patient's admission. No further CSW needs were reported at this time.    Employment status:  Disabled (Comment on whether or not currently receiving Disability) Insurance information:  Managed Medicare PT Recommendations:  Not assessed at this time Information / Referral to community resources:     Patient/Family's Response to care:  Wife reports the patient has been a resident at [facility] for about 6-8 weeks. Wife reports no concerns with the facility. Wife informed CSW that the patient was experiencing "jerking in his nerves and could not be still". Wife reports great concern for the patient so she decided to get the patient checked out.   Patient/Family's Understanding of and Emotional Response to Diagnosis, Current Treatment, and Prognosis:  Wife is aware and understanding of current treatment and prognosis. Wife voiced that she would have liked for the patient to come back home, however believes if short term rehab is recommended then she will stick to that plan. Wife is hopeful for patient's progress and return back home.    Emotional Assessment Appearance:  Appears stated age Attitude/Demeanor/Rapport:   (Unable to assess ) Affect (typically observed):   (Unable to assess) Orientation:   (Unable to assess) Alcohol / Substance use:  Not Applicable Psych involvement (Current and /or in the community):  No (Comment)  Discharge Needs  Concerns to be addressed:  Discharge Planning Concerns Readmission within the last 30 days:  No Current discharge risk:  Other (Further medical work up) Barriers to Discharge:  Other (Further medical work up)   Raymondo Band, LCSW 12/22/2015, 2:47 PM

## 2015-12-22 NOTE — Progress Notes (Signed)
ANTIBIOTIC CONSULT NOTE - INITIAL  Pharmacy Consult for vancomycin Indication: Discitis   Allergies  Allergen Reactions  . Adacel [Diphth-Acell Pertussis-Tetanus]   . Lipitor [Atorvastatin] Other (See Comments)    Muscle pain  . Penicillins     UNKNOWN  . Septra [Sulfamethoxazole-Trimethoprim]   . Simvastatin     MUSCLE PAIN    Patient Measurements: Height: 5' 6.5" (168.9 cm) Weight: 176 lb (79.833 kg) IBW/kg (Calculated) : 64.95  Vital Signs: Temp: 98.6 F (37 C) (01/01 1605) Temp Source: Oral (01/01 1605) BP: 119/64 mmHg (01/01 1605) Pulse Rate: 111 (01/01 1605) Intake/Output from previous day:   Intake/Output from this shift:    Labs:  Recent Labs  12/22/15 0059  WBC 8.6  HGB 10.4*  PLT 303  CREATININE 1.51*   Estimated Creatinine Clearance: 39.8 mL/min (by C-G formula based on Cr of 1.51).  Recent Labs  12/22/15 1617  VANCORANDOM 18     Microbiology: Recent Results (from the past 720 hour(s))  C difficile quick scan w PCR reflex     Status: None   Collection Time: 12/22/15  2:10 PM  Result Value Ref Range Status   C Diff antigen NEGATIVE NEGATIVE Final   C Diff toxin NEGATIVE NEGATIVE Final   C Diff interpretation Negative for toxigenic C. difficile  Final    Medical History: Past Medical History  Diagnosis Date  . Hyperlipidemia   . Gout   . Glaucoma     "high normal"  . Diabetes mellitus without complication ([place])   . Myocardial infarction [facility])     "told me I had a small heart attack"  . GERD (gastroesophageal reflux disease)   . Cancer [facility]) 2010    prostate  . Arthritis   . H/O blood clots     R Lower leg, right upper leg  . Coronary artery disease   . Low back pain     "severe"  . Polymyalgia ([place])     h/o  . Osteoporosis   . Osteopenia   . Obesity   . Azotemia   . Thrombophlebitis   . Tinnitus   . Joint pain   . Heel spur   . Spinal stenosis   . Renal cyst, left   . Shingles     LEFT ABDOMEN  . Tubular adenoma    . Lesion of left native kidney   . Mild aortic stenosis   . Chondromalacia     Assessment: 41 YOM admitted from SNF with diarrhea. He was started on Vancomycin and Doxycycline on 12/2 for lumbar discitis. Doxycycline 100 mg po BID has also been resumed. Per record pt was on Vancomycin 500 mg Q 12 hrs at nursing home, per patient last dose was yesterday at ~ 2000. Pt. With CKD, scr 1.5 close to baseline. Est. crcl ~ 40 ml/min.  His Vancomycin random level is 18, obtained ~20 hours after his last Vancomycin dose.  This indicates that he was likely supratherapeutic on the q12h regimen he was receiving prior to admission.  Goal of Therapy:  Vancomycin trough level 15-20 mcg/ml  Plan:  Vancomycin 1g IV q24h, first dose tonight Watch renal function closely Check Vancomycin trough at steady state  Legrand Como, Pharm.D., BCPS, AAHIVP Clinical Pharmacist Phone: [phone] or [phone] 12/22/2015, 5:26 PM

## 2015-12-22 NOTE — ED Notes (Signed)
IV Team at the bedside to assess PICC Line

## 2015-12-22 NOTE — ED Notes (Signed)
Pt reports not feeling any better post benadryl.

## 2015-12-23 ENCOUNTER — Encounter (HOSPITAL_COMMUNITY): Payer: Self-pay | Admitting: *Deleted

## 2015-12-23 DIAGNOSIS — E119 Type 2 diabetes mellitus without complications: Secondary | ICD-10-CM

## 2015-12-23 DIAGNOSIS — R197 Diarrhea, unspecified: Secondary | ICD-10-CM

## 2015-12-23 DIAGNOSIS — I10 Essential (primary) hypertension: Secondary | ICD-10-CM

## 2015-12-23 DIAGNOSIS — M4640 Discitis, unspecified, site unspecified: Secondary | ICD-10-CM

## 2015-12-23 DIAGNOSIS — R258 Other abnormal involuntary movements: Secondary | ICD-10-CM

## 2015-12-23 LAB — BASIC METABOLIC PANEL
Anion gap: 8 (ref 5–15)
BUN: 7 mg/dL (ref 6–20)
CHLORIDE: 108 mmol/L (ref 101–111)
CO2: 24 mmol/L (ref 22–32)
CREATININE: 1.35 mg/dL — AB (ref 0.61–1.24)
Calcium: 8.9 mg/dL (ref 8.9–10.3)
GFR calc non Af Amer: 48 mL/min — ABNORMAL LOW (ref >60)
GFR, EST AFRICAN AMERICAN: 56 mL/min — AB (ref >60)
Glucose, Bld: 126 mg/dL — ABNORMAL HIGH (ref 65–99)
POTASSIUM: 3.8 mmol/L (ref 3.5–5.1)
SODIUM: 140 mmol/L (ref 135–145)

## 2015-12-23 LAB — CBC
HEMATOCRIT: 28.7 % — AB (ref 39.0–52.0)
Hemoglobin: 9 g/dL — ABNORMAL LOW (ref 13.0–17.0)
MCH: 30.2 pg (ref 26.0–34.0)
MCHC: 31.4 g/dL (ref 30.0–36.0)
MCV: 96.3 fL (ref 78.0–100.0)
PLATELETS: 290 10*3/uL (ref 150–400)
RBC: 2.98 MIL/uL — AB (ref 4.22–5.81)
RDW: 18 % — ABNORMAL HIGH (ref 11.5–15.5)
WBC: 7.6 10*3/uL (ref 4.0–10.5)

## 2015-12-23 LAB — GLUCOSE, CAPILLARY
GLUCOSE-CAPILLARY: 119 mg/dL — AB (ref 65–99)
GLUCOSE-CAPILLARY: 123 mg/dL — AB (ref 65–99)
GLUCOSE-CAPILLARY: 102 mg/dL — AB (ref 65–99)
Glucose-Capillary: 122 mg/dL — ABNORMAL HIGH (ref 65–99)

## 2015-12-23 NOTE — Care Management Obs Status (Signed)
[facility] NOTIFICATION   Patient Details  Name: [name] MRN: [id number] Date of Birth: [birth year]   Medicare Observation Status Notification Given:  Yes    Dawayne Patricia, RN 12/23/2015, 4:04 PM

## 2015-12-23 NOTE — Progress Notes (Addendum)
Physical Therapy Evaluation and DISCHARGE Patient Details Name: [name] MRN: [id number] DOB: 1934/05/18 Today's Date: 12/23/2015   History of Present Illness  pt is a 82 y/o male with h/o prostate CA, CAD, osteoporosis, arthritic pain, recent abx for discitis, admitted with episode of significant shaking which resolved fairly quickly, then pt's status deteriorated with nausea and vomiting.  Clinical Impression  Pt admitted with shaking that resolved quickly, but then status deteriorated with nausea and vomiting.  Pt was admitted for observation.  He presents to therapy in a deconditioned state.  Feel some further therapy to recondition is warranted.  Will sign off from acute PT at this time.    Follow Up Recommendations SNF    Equipment Recommendations       Recommendations for Other Services       Precautions / Restrictions Precautions Precautions: Back;Fall Precaution Comments: advised back precautions Restrictions Weight Bearing Restrictions: No      Mobility  Bed Mobility   Bed Mobility: Rolling;Sidelying to Sit Rolling: Supervision Sidelying to sit: Supervision       General bed mobility comments: cues for log roll, but no assist needed  Transfers Overall transfer level: Needs assistance Equipment used:  (pushed the IV pole) Transfers: Sit to/from Stand Sit to Stand: Min guard            Ambulation/Gait Ambulation/Gait assistance: Min guard Ambulation Distance (Feet): 200 Feet Assistive device:  (pushing iv pole)       General Gait Details: generally steady overall. with SpO2 at 98% and EHR up to 132 bpm  over 2 readings  Stairs            Wheelchair Mobility    Modified Rankin (Stroke Patients Only)       Balance Overall balance assessment: Needs assistance Sitting-balance support: No upper extremity supported Sitting balance-Leahy Scale: Fair     Standing balance support: Single extremity supported;No upper extremity  supported Standing balance-Leahy Scale: Fair Standing balance comment: reliant on assistive device, but statically can maintain balance.                             Pertinent Vitals/Pain      Home Living Family/patient expects to be discharged to:: Private residence Living Arrangements: Spouse/significant other Available Help at Discharge: Family;Available 24 hours/day Type of Home: House Home Access: Stairs to enter Entrance Stairs-Rails: None Entrance Stairs-Number of Steps: 2 Home Layout: One level Home Equipment: Walker - 2 wheels;Cane - single point;Shower seat      Prior Function Level of Independence: Needs assistance               Hand Dominance        Extremity/Trunk Assessment   Upper Extremity Assessment: Defer to OT evaluation           Lower Extremity Assessment: Overall WFL for tasks assessed (L Proximal/hamstring weakness, R functional)         Communication   Communication: No difficulties  Cognition Arousal/Alertness: Awake/alert Behavior During Therapy: WFL for tasks assessed/performed Overall Cognitive Status: Within Functional Limits for tasks assessed                      General Comments      Exercises        Assessment/Plan    PT Assessment All further PT needs can be met in the next venue of care  PT Diagnosis Generalized weakness  PT Problem List Decreased strength;Decreased activity tolerance  PT Treatment Interventions     PT Goals (Current goals can be found in the Care Plan section) Acute Rehab PT Goals Patient Stated Goal: to get better PT Goal Formulation: With patient Time For Goal Achievement: 12/30/15 Potential to Achieve Goals: Fair    Frequency Min 3X/week   Barriers to discharge        Co-evaluation               End of Session     Patient left: in bed;with call bell/phone within reach;with bed alarm set Nurse Communication: Mobility status    Functional Assessment  Tool Used: clinical judgement Functional Limitation: Mobility: Walking and moving around Mobility: Walking and Moving Around Current Status (S4383): At least 1 percent but less than 20 percent impaired, limited or restricted Mobility: Walking and Moving Around Discharge Status [phone]): At least 1 percent but less than 20 percent impaired, limited or restricted    Time: 1419-1437 PT Time Calculation (min) (ACUTE ONLY): 18 min   Charges:   PT Evaluation $Initial PT Evaluation Tier I: 1 Procedure     PT G Codes:   PT G-Codes **NOT FOR INPATIENT CLASS** Functional Assessment Tool Used: clinical judgement Functional Limitation: Mobility: Walking and moving around Mobility: Walking and Moving Around Current Status (S8864): At least 1 percent but less than 20 percent impaired, limited or restricted Mobility: Walking and Moving Around Discharge Status [phone]): At least 1 percent but less than 20 percent impaired, limited or restricted    Sulema Braid, Tessie Fass 12/23/2015, 5:10 PM 12/23/2015  Donnella Sham, PT [phone]  (pager)

## 2015-12-23 NOTE — Progress Notes (Signed)
Facility aware of pt probably DC tomorrow and can accept back pending insurance auth  [employer] auth initiated.  CSW will continue to follow  Domenica Reamer, [place] Social Worker [phone]

## 2015-12-23 NOTE — Progress Notes (Signed)
PROGRESS NOTE  [name] [id number] DOB: 1934-12-08 DOA: 12/21/2015 PCP: Mathews Argyle, MD  Assessment/Plan: Jerking movement of extremities, resolved.   Acute diarrhea, vomiting. Patient has chronic constipation secondary to narcotics. He took MiraLAX yesterday -Stools for C-diff negative -Hold home MiraLAX and laxatives -IV antiemetics -PT evaluation -resolved  Sinus tachycardia -Monitor on telemetry  -IV fluids for gentle hydration. Received one liter bolus in ED -EKG   Discitis following,lumbar surgery in October. On antibiotics via PICC under direction of Dr. Earnie Larsson. -continue home IV antibiotics via PICC -continue home pain medications  Weight loss, probably multifactorial (back surgery, recent admission for sepsis, unappetizing food at facility).   Diabetes, Type 2 -Hold home Glucotrol -Monitor CBG, start sliding scale insulin  Mild RLE swelling, chronic.   CKD, stage II Cr 1.51, better than baseline -continue to monitor renal function.  Essential hypertension, stable.  -continue home medications. If vomiting and cannot tolerate then will give IV anti-hypertensives.   Gout -continue home zyloprima  History of prostate cancer, s/p radiation  Remote history of renal cancer, status post partial nephrectomy  CAD (coronary artery disease) of artery bypass graft  Code Status: full Family Communication: wife at bedside Disposition Plan: watch overnight and back to SNF in AM   Consultants:    Procedures:     HPI/Subjective: Feeling fine No further diarrhea Wife and patient think pain meds are making him confused  Objective: Filed Vitals:   12/22/15 2007 12/23/15 0450  BP: 116/64 112/64  Pulse: 105 100  Temp: 98 F (36.7 C) 98.5 F (36.9 C)  Resp: 18 18    Intake/Output Summary (Last 24 hours) at 12/23/15 1251 Last data filed at 12/23/15 0900  Gross per 24 hour  Intake    240 ml  Output    650 ml  Net   -410 ml    Filed Weights   12/22/15 0002  Weight: 79.833 kg (176 lb)    Exam:   General:  Awake, A+Ox3  Cardiovascular: rrr  Respiratory: clear  Abdomen: +BS, soft  Musculoskeletal: no edema   Data Reviewed: Basic Metabolic Panel:  Recent Labs Lab 12/22/15 0059 12/23/15 0522  NA 141 140  K 4.1 3.8  CL 105 108  CO2 24 24  GLUCOSE 93 126*  BUN 9 7  CREATININE 1.51* 1.35*  CALCIUM 10.1 8.9   Liver Function Tests: No results for input(s): AST, ALT, ALKPHOS, BILITOT, PROT, ALBUMIN in the last 168 hours. No results for input(s): LIPASE, AMYLASE in the last 168 hours. No results for input(s): AMMONIA in the last 168 hours. CBC:  Recent Labs Lab 12/22/15 0059 12/23/15 0522  WBC 8.6 7.6  NEUTROABS 4.6  --   HGB 10.4* 9.0*  HCT 31.9* 28.7*  MCV 97.6 96.3  PLT 303 290   Cardiac Enzymes: No results for input(s): CKTOTAL, CKMB, CKMBINDEX, TROPONINI in the last 168 hours. BNP (last 3 results) No results for input(s): BNP in the last 8760 hours.  ProBNP (last 3 results) No results for input(s): PROBNP in the last 8760 hours.  CBG:  Recent Labs Lab 12/22/15 1604 12/22/15 2111 12/23/15 0655 12/23/15 1126  GLUCAP 137* 148* 122* 119*    Recent Results (from the past 240 hour(s))  Gastrointestinal Panel by PCR , Stool     Status: None   Collection Time: 12/22/15 10:08 AM  Result Value Ref Range Status   Campylobacter species NOT DETECTED NOT DETECTED Final   Plesimonas shigelloides NOT DETECTED NOT DETECTED Final  Salmonella species NOT DETECTED NOT DETECTED Final   Yersinia enterocolitica NOT DETECTED NOT DETECTED Final   Vibrio species NOT DETECTED NOT DETECTED Final   Vibrio cholerae NOT DETECTED NOT DETECTED Final   Enteroaggregative E coli (EAEC) NOT DETECTED NOT DETECTED Final   Enteropathogenic E coli (EPEC) NOT DETECTED NOT DETECTED Final   Enterotoxigenic E coli (ETEC) NOT DETECTED NOT DETECTED Final   Shiga like toxin producing E coli (STEC) NOT  DETECTED NOT DETECTED Final   E. coli O157 NOT DETECTED NOT DETECTED Final   Shigella/Enteroinvasive E coli (EIEC) NOT DETECTED NOT DETECTED Final   Cryptosporidium NOT DETECTED NOT DETECTED Final   Cyclospora cayetanensis NOT DETECTED NOT DETECTED Final   Entamoeba histolytica NOT DETECTED NOT DETECTED Final   Giardia lamblia NOT DETECTED NOT DETECTED Final   Adenovirus F40/41 NOT DETECTED NOT DETECTED Final   Astrovirus NOT DETECTED NOT DETECTED Final   Norovirus GI/GII NOT DETECTED NOT DETECTED Final   Rotavirus A NOT DETECTED NOT DETECTED Final   Sapovirus (I, II, IV, and V) NOT DETECTED NOT DETECTED Final  C difficile quick scan w PCR reflex     Status: None   Collection Time: 12/22/15  2:10 PM  Result Value Ref Range Status   C Diff antigen NEGATIVE NEGATIVE Final   C Diff toxin NEGATIVE NEGATIVE Final   C Diff interpretation Negative for toxigenic C. difficile  Final     Studies: No results found.  Scheduled Meds: . allopurinol  300 mg Oral Daily  . doxycycline  100 mg Oral Q12H  . enoxaparin (LOVENOX) injection  40 mg Subcutaneous Q24H  . insulin aspart  0-9 Units Subcutaneous TID WC  . rosuvastatin  5 mg Oral q1800  . sodium chloride  3 mL Intravenous Q12H  . tamsulosin  0.4 mg Oral QPC supper  . vancomycin  1,000 mg Intravenous Q24H   Continuous Infusions:  Antibiotics Given (last 72 hours)    Date/Time Action Medication Dose Rate   12/22/15 1816 Given   doxycycline (VIBRA-TABS) tablet 100 mg 100 mg    12/22/15 1816 Given   vancomycin (VANCOCIN) IVPB 1000 mg/200 mL premix 1,000 mg 200 mL/hr   12/23/15 1126 Given   doxycycline (VIBRA-TABS) tablet 100 mg 100 mg       Active Problems:   Diabetes mellitus without complication (HCC)   CAD (coronary artery disease) of artery bypass graft   Essential hypertension   CKD (chronic kidney disease), stage III   Vomiting   Diarrhea   Tachycardia   Discitis   Jerking movements of extremities    Time spent: 25  min    [facility] Pager [phone] If 7PM-7AM, please contact night-coverage at [email or web address], password [facility] 12/23/2015, 12:51 PM

## 2015-12-24 DIAGNOSIS — E119 Type 2 diabetes mellitus without complications: Secondary | ICD-10-CM | POA: Diagnosis not present

## 2015-12-24 DIAGNOSIS — R197 Diarrhea, unspecified: Secondary | ICD-10-CM | POA: Diagnosis not present

## 2015-12-24 DIAGNOSIS — M4640 Discitis, unspecified, site unspecified: Secondary | ICD-10-CM | POA: Diagnosis not present

## 2015-12-24 LAB — GLUCOSE, CAPILLARY: Glucose-Capillary: 78 mg/dL (ref 65–99)

## 2015-12-24 MED ORDER — INSULIN ASPART 100 UNIT/ML ~~LOC~~ SOLN: 0.0000 [IU] | Freq: Three times a day (TID) | SUBCUTANEOUS | Status: DC

## 2015-12-24 MED ORDER — DOCUSATE SODIUM 100 MG PO CAPS: 100.0000 mg | ORAL_CAPSULE | Freq: Two times a day (BID) | ORAL | Status: DC

## 2015-12-24 MED ORDER — HYDROCODONE-ACETAMINOPHEN 10-325 MG PO TABS: 1.0000 | ORAL_TABLET | Freq: Four times a day (QID) | ORAL | Status: DC | PRN

## 2015-12-24 NOTE — Discharge Summary (Signed)
Physician Discharge Summary  [name] [id number] DOB: 01-26-1934 DOA: 12/21/2015  PCP: Mathews Argyle, MD  Admit date: 12/21/2015 Discharge date: 12/24/2015  Time spent: 35 minutes  Recommendations for Outpatient Follow-up:  1. Monitor blood sugar with SSI-- off oral and blood sugars controlled 2. Limit pain medications 3. Bowel regimen with colase-- PRN sennakot   Discharge Diagnoses:  Active Problems:   Diabetes mellitus without complication (HCC)   CAD (coronary artery disease) of artery bypass graft   Essential hypertension   CKD (chronic kidney disease), stage III   Vomiting   Diarrhea   Tachycardia   Discitis   Jerking movements of extremities   Discharge Condition: improved  Diet recommendation: cardiac/diabetic  Filed Weights   12/22/15 0002  Weight: 79.833 kg (176 lb)    History of present illness:  [name] is a 82 y.o. male with multiple medical problems. Patient brought in from [facility] last night with jerking of his upper and lower extremities. Symptoms resolved, seizure felt to be unlikely. Patient was waiting for transportation back to [facility] when he developed several episodes of diarrhea and vomiting. He's been tachycardic since arrival to the emergency department. Patient has chronic constipation secondary to narcotics. He takes MiraLAX and laxatives as needed. Patient took MiraLAX yesterday before coming to the emergency department. Wife feels the amount of diarrhea is out of proportion to the MiraLAX dose. Patient had episode of vomiting yesterday, he vomited once in the emergency department this morning. He endorses some fleeting mid lower abdominal pain this morning.   Patient has a PICC line in his right upper extremity or he has been receiving antibiotics for discitis.   Hospital Course:  Jerking movement of extremities, resolved.   Acute diarrhea, vomiting. Patient has chronic constipation secondary to narcotics. He  took MiraLAX yesterday -Stools for C-diff negative -Hold home MiraLAX and laxatives -IV antiemetics -resolved  Sinus tachycardia -Monitor on telemetry  -IV fluids for gentle hydration. Received one liter bolus in ED  Discitis following,lumbar surgery in October. On antibiotics via PICC under direction of Dr. Earnie Larsson. -continue home IV antibiotics via PICC -continue home pain medications  Weight loss, probably multifactorial (back surgery, recent admission for sepsis, unappetizing food at facility).   Diabetes, Type 2 -Hold home Glucotrol -Monitor CBG, start sliding scale insulin - controlled here with diet  Mild RLE swelling, chronic.   CKD, stage II Cr 1.51, better than baseline -continue to monitor renal function.  Essential hypertension, stable.  -continue home medications. If vomiting and cannot tolerate then will give IV anti-hypertensives.   Gout -continue home zyloprima  History of prostate cancer, s/p radiation  Remote history of renal cancer, status post partial nephrectomy  CAD (coronary artery disease) of artery bypass graft  Procedures:    Consultations:    Discharge Exam: Filed Vitals:   12/23/15 2127 12/23/15 2318  BP: 90/55 107/62  Pulse: 91 92  Temp: 99.1 F (37.3 C)   Resp: 18     General: feeling much better, no SOB, no Nausea, no diarhea  Discharge Instructions   Discharge Instructions    Diet - low sodium heart healthy    Complete by:  As directed      Diet Carb Modified    Complete by:  As directed      Discharge instructions    Complete by:  As directed   D/c with PICC Continue abx until stop date Monitor blood sugars with SSI-- stopped oral diabetic med as  BS normal here     Increase activity slowly    Complete by:  As directed           Current Discharge Medication List    START taking these medications   Details  docusate sodium (COLACE) 100 MG capsule Take 1 capsule (100 mg total) by mouth 2 (two) times  daily. Qty: 10 capsule, Refills: 0    insulin aspart (NOVOLOG) 100 UNIT/ML injection Inject 0-9 Units into the skin 3 (three) times daily with meals. Qty: 10 mL, Refills: 11      CONTINUE these medications which have CHANGED   Details  HYDROcodone-acetaminophen (NORCO) 10-325 MG tablet Take 1-2 tablets by mouth every 6 (six) hours as needed for moderate pain. Qty: 10 tablet, Refills: 0      CONTINUE these medications which have NOT CHANGED   Details  acetaminophen (TYLENOL) 325 MG tablet Take 1 tablet (325 mg total) by mouth every 6 (six) hours as needed for pain (1-2 tablets q 6 hrs prn pain). Qty: 30 tablet, Refills: 0    alendronate (FOSAMAX) 70 MG tablet Take 70 mg by mouth daily. Take with a full glass of water on an empty stomach. Patient takes this medication once weekly on Saturday.    allopurinol (ZYLOPRIM) 300 MG tablet Take 300 mg by mouth daily. Refills: 2    doxycycline (VIBRAMYCIN) 100 MG capsule Take 100 mg by mouth 2 (two) times daily.    LACTOBACILLUS PO Take 1 tablet by mouth daily.    rosuvastatin (CRESTOR) 5 MG tablet Take 5 mg by mouth daily. Refills: 5    tamsulosin (FLOMAX) 0.4 MG CAPS capsule Take 0.4 mg by mouth daily. Refills: 0    vancomycin 500 mg in sodium chloride 0.9 % 100 mL Inject 500 mg into the vein every 12 (twelve) hours.      STOP taking these medications     glipiZIDE (GLUCOTROL) 10 MG tablet      polyethylene glycol (MIRALAX / GLYCOLAX) packet      senna-docusate (SENOKOT-S) 8.6-50 MG tablet        Allergies  Allergen Reactions  . Adacel [Diphth-Acell Pertussis-Tetanus]   . Flexeril [Cyclobenzaprine]     Causes confusion  . Lipitor [Atorvastatin] Other (See Comments)    Muscle pain  . Penicillins     UNKNOWN  . Septra [Sulfamethoxazole-Trimethoprim]   . Simvastatin     MUSCLE PAIN      The results of significant diagnostics from this hospitalization (including imaging, microbiology, ancillary and laboratory) are  listed below for reference.    Significant Diagnostic Studies: No results found.  Microbiology: Recent Results (from the past 240 hour(s))  Gastrointestinal Panel by PCR , Stool     Status: None   Collection Time: 12/22/15 10:08 AM  Result Value Ref Range Status   Campylobacter species NOT DETECTED NOT DETECTED Final   Plesimonas shigelloides NOT DETECTED NOT DETECTED Final   Salmonella species NOT DETECTED NOT DETECTED Final   Yersinia enterocolitica NOT DETECTED NOT DETECTED Final   Vibrio species NOT DETECTED NOT DETECTED Final   Vibrio cholerae NOT DETECTED NOT DETECTED Final   Enteroaggregative E coli (EAEC) NOT DETECTED NOT DETECTED Final   Enteropathogenic E coli (EPEC) NOT DETECTED NOT DETECTED Final   Enterotoxigenic E coli (ETEC) NOT DETECTED NOT DETECTED Final   Shiga like toxin producing E coli (STEC) NOT DETECTED NOT DETECTED Final   E. coli O157 NOT DETECTED NOT DETECTED Final   Shigella/Enteroinvasive E coli (EIEC)  NOT DETECTED NOT DETECTED Final   Cryptosporidium NOT DETECTED NOT DETECTED Final   Cyclospora cayetanensis NOT DETECTED NOT DETECTED Final   Entamoeba histolytica NOT DETECTED NOT DETECTED Final   Giardia lamblia NOT DETECTED NOT DETECTED Final   Adenovirus F40/41 NOT DETECTED NOT DETECTED Final   Astrovirus NOT DETECTED NOT DETECTED Final   Norovirus GI/GII NOT DETECTED NOT DETECTED Final   Rotavirus A NOT DETECTED NOT DETECTED Final   Sapovirus (I, II, IV, and V) NOT DETECTED NOT DETECTED Final  C difficile quick scan w PCR reflex     Status: None   Collection Time: 12/22/15  2:10 PM  Result Value Ref Range Status   C Diff antigen NEGATIVE NEGATIVE Final   C Diff toxin NEGATIVE NEGATIVE Final   C Diff interpretation Negative for toxigenic C. difficile  Final     Labs: Basic Metabolic Panel:  Recent Labs Lab 12/22/15 0059 12/23/15 0522  NA 141 140  K 4.1 3.8  CL 105 108  CO2 24 24  GLUCOSE 93 126*  BUN 9 7  CREATININE 1.51* 1.35*   CALCIUM 10.1 8.9   Liver Function Tests: No results for input(s): AST, ALT, ALKPHOS, BILITOT, PROT, ALBUMIN in the last 168 hours. No results for input(s): LIPASE, AMYLASE in the last 168 hours. No results for input(s): AMMONIA in the last 168 hours. CBC:  Recent Labs Lab 12/22/15 0059 12/23/15 0522  WBC 8.6 7.6  NEUTROABS 4.6  --   HGB 10.4* 9.0*  HCT 31.9* 28.7*  MCV 97.6 96.3  PLT 303 290   Cardiac Enzymes: No results for input(s): CKTOTAL, CKMB, CKMBINDEX, TROPONINI in the last 168 hours. BNP: BNP (last 3 results) No results for input(s): BNP in the last 8760 hours.  ProBNP (last 3 results) No results for input(s): PROBNP in the last 8760 hours.  CBG:  Recent Labs Lab 12/23/15 0655 12/23/15 1126 12/23/15 1647 12/23/15 2117 12/24/15 0627  GLUCAP 122* 119* 123* 102* 78       Signed:  Hawley DO  [facility] 12/24/2015, 10:42 AM

## 2015-12-24 NOTE — Progress Notes (Signed)
Patient will discharge to [facility] Anticipated discharge date:1/3 Family notified: at bedside Transportation by family  CSW signing off.  Domenica Reamer, [place] Social Worker [phone]

## 2015-12-24 NOTE — Progress Notes (Signed)
Order received to discharge.  Pt removed from telemetry and CCMD notified.  IV removed with catheter intact.  Discharge education given to Pt with wife at bedside.  Pt discharging back to SNF, report called to SNF.  Pt assisted to exit via [facility] and transferred into vehicle with wife.  Pt stable at discharge, no c/o pain or sob.  No shaking of extremities noted.

## 2015-12-26 ENCOUNTER — Non-Acute Institutional Stay (SKILLED_NURSING_FACILITY): Payer: Medicare Other | Admitting: Internal Medicine

## 2015-12-26 DIAGNOSIS — M5106 Intervertebral disc disorders with myelopathy, lumbar region: Secondary | ICD-10-CM

## 2015-12-26 DIAGNOSIS — E785 Hyperlipidemia, unspecified: Secondary | ICD-10-CM | POA: Diagnosis not present

## 2015-12-26 DIAGNOSIS — M810 Age-related osteoporosis without current pathological fracture: Secondary | ICD-10-CM | POA: Diagnosis not present

## 2015-12-26 DIAGNOSIS — K5900 Constipation, unspecified: Secondary | ICD-10-CM | POA: Diagnosis not present

## 2015-12-26 DIAGNOSIS — N183 Chronic kidney disease, stage 3 unspecified: Secondary | ICD-10-CM

## 2015-12-26 DIAGNOSIS — E46 Unspecified protein-calorie malnutrition: Secondary | ICD-10-CM

## 2015-12-26 DIAGNOSIS — I10 Essential (primary) hypertension: Secondary | ICD-10-CM | POA: Diagnosis not present

## 2015-12-26 DIAGNOSIS — E119 Type 2 diabetes mellitus without complications: Secondary | ICD-10-CM

## 2015-12-26 DIAGNOSIS — R531 Weakness: Secondary | ICD-10-CM | POA: Diagnosis not present

## 2015-12-26 DIAGNOSIS — K219 Gastro-esophageal reflux disease without esophagitis: Secondary | ICD-10-CM | POA: Diagnosis not present

## 2015-12-26 DIAGNOSIS — M4646 Discitis, unspecified, lumbar region: Secondary | ICD-10-CM | POA: Diagnosis not present

## 2015-12-26 NOTE — Progress Notes (Signed)
Patient ID: [name], male   DOB: 1934/10/28, 82 y.o.   MRN: [id number]      Facility: [facility]    PCP: Mathews Argyle, MD    Allergies  Allergen Reactions  . Adacel [Diphth-Acell Pertussis-Tetanus]   . Flexeril [Cyclobenzaprine]     Causes confusion  . Lipitor [Atorvastatin] Other (See Comments)    Muscle pain  . Penicillins     UNKNOWN  . Septra [Sulfamethoxazole-Trimethoprim]   . Simvastatin     MUSCLE PAIN    Chief Complaint  Patient presents with  . Readmit To SNF     HPI:  82 y.o. patient is here for short term rehabilitation post hospital admission from 12/21/15-12/24/15 with abnormal jerking movements. He was further noted to have vomiting and diarrhea. C.diff was negative. His stool softner and laxative were held. He was given iv fluids. His antibiotic for discitis was continued. He is seen in his room today with his wife at bedside. He feels low in terms of energy. His appetite remains poor. His pain is under control with current pain regimen. Of note, he was undergoing rehab here for lumbar disc disorder with myelopathy and lumbar discitis. He has medical history of HTN, DM, CKD among others   Review of Systems:  Constitutional: Negative for fever, chills.  HENT: Negative for headache, congestion, nasal discharge. Eyes: Negative for eye pain, blurred vision, double vision and discharge.  Respiratory: Negative for cough, shortness of breath and wheezing.   Cardiovascular: Negative for chest pain, palpitations, leg swelling.  Gastrointestinal: Negative for nausea, vomiting, abdominal pain. Has heartburn.  Genitourinary: Negative for dysuria  Musculoskeletal: Negative for falls in the facility. Has chronic back pain Skin: Negative for itching, rash.  Neurological: Negative for dizziness. Has weakness and numbness to his legs Psychiatric/Behavioral: Negative for depression   Past Medical History  Diagnosis Date  .  Hyperlipidemia   . Gout   . Glaucoma     "high normal"  . Diabetes mellitus without complication ([place])   . Myocardial infarction [facility])     "told me I had a small heart attack"  . GERD (gastroesophageal reflux disease)   . Cancer [facility]) 2010    prostate  . Arthritis   . H/O blood clots     R Lower leg, right upper leg  . Coronary artery disease   . Low back pain     "severe"  . Polymyalgia ([place])     h/o  . Osteoporosis   . Osteopenia   . Obesity   . Azotemia   . Thrombophlebitis   . Tinnitus   . Joint pain   . Heel spur   . Spinal stenosis   . Renal cyst, left   . Shingles     LEFT ABDOMEN  . Tubular adenoma   . Lesion of left native kidney   . Mild aortic stenosis   . Chondromalacia   . Hypertension    Past Surgical History  Procedure Laterality Date  . Prostate surgery      seed implant  . Coronary artery bypass graft  2007  . Vein ligation and stripping Right 1972    right lower leg  . Appendectomy  60 years ago  . Cardiac catheterization    . Robotic assited partial nephrectomy Left 08/31/2013    Procedure: ROBOTIC ASSITED PARTIAL NEPHRECTOMY;  Surgeon: Dutch Gray, MD;  Location: [facility] ORS;  Service: Urology;  Laterality: Left;   Social History:   reports  that he quit smoking about 35 years ago. His smoking use included Cigarettes. He has a 90 pack-year smoking history. He has never used smokeless tobacco. He reports that he does not drink alcohol or use illicit drugs.  Family History  Problem Relation Age of Onset  . Heart Problems Mother   . Heart Problems Father     Medications:   Medication List       This list is accurate as of: 12/26/15  6:17 PM.  Always use your most recent med list.               acetaminophen 325 MG tablet  Commonly known as:  TYLENOL  Take 1 tablet (325 mg total) by mouth every 6 (six) hours as needed for pain (1-2 tablets q 6 hrs prn pain).     alendronate 70 MG tablet  Commonly known as:  FOSAMAX  Take 70 mg by mouth  daily. Take with a full glass of water on an empty stomach. Patient takes this medication once weekly on Saturday.     allopurinol 300 MG tablet  Commonly known as:  ZYLOPRIM  Take 300 mg by mouth daily.     docusate sodium 100 MG capsule  Commonly known as:  COLACE  Take 1 capsule (100 mg total) by mouth 2 (two) times daily.     doxycycline 100 MG capsule  Commonly known as:  VIBRAMYCIN  Take 100 mg by mouth 2 (two) times daily.     HYDROcodone-acetaminophen 10-325 MG tablet  Commonly known as:  NORCO  Take 1-2 tablets by mouth every 6 (six) hours as needed for moderate pain.     insulin aspart 100 UNIT/ML injection  Commonly known as:  novoLOG  Inject 0-9 Units into the skin 3 (three) times daily with meals.     LACTOBACILLUS PO  Take 1 tablet by mouth daily.     rosuvastatin 5 MG tablet  Commonly known as:  CRESTOR  Take 5 mg by mouth daily.     tamsulosin 0.4 MG Caps capsule  Commonly known as:  FLOMAX  Take 0.4 mg by mouth daily.     vancomycin 500 mg in sodium chloride 0.9 % 100 mL  Inject 500 mg into the vein every 12 (twelve) hours.         Physical Exam: Filed Vitals:   12/26/15 1816  BP: 100/54  Pulse: 88  Temp: 98.3 F (36.8 C)  Resp: 18  SpO2: 98%   General- elderly male in no acute distress Head- atraumatic, normocephalic Eyes- PERRLA, EOMI, no pallor, no icterus Neck- no lymphadenopathy, no jugular vein distension Cardiovascular- normal s1,s2, no murmurs Respiratory- bilateral clear to auscultation, no wheeze, no rhonchi, no crackles Abdomen- bowel sounds present, soft, non tender Musculoskeletal- able to move all 4 extremities, paraspinal tenderness to lumbar area +. RUE PICC line Neurological- no focal deficit, alert and oriented to person, place and time, normal mood and affect Psychiatry- normal mood   Labs reviewed: Basic Metabolic Panel:  Recent Labs  10/30/15 0317 12/22/15 0059 12/23/15 0522  NA 132* 141 140  K 3.9 4.1 3.8    CL 95* 105 108  CO2 26 24 24   GLUCOSE 230* 93 126*  BUN 18 9 7   CREATININE 1.65* 1.51* 1.35*  CALCIUM 8.5* 10.1 8.9   Liver Function Tests:  Recent Labs  10/25/15 2330  AST 26  ALT 17  ALKPHOS 85  BILITOT 1.1  PROT 6.9  ALBUMIN 2.7*  Recent Labs  10/25/15 2330  LIPASE 25   No results for input(s): AMMONIA in the last 8760 hours. CBC:  Recent Labs  10/25/15 2330  10/30/15 0317 12/22/15 0059 12/23/15 0522  WBC 17.9*  < > 7.0 8.6 7.6  NEUTROABS 16.6*  --   --  4.6  --   HGB 12.4*  < > 9.8* 10.4* 9.0*  HCT 37.0*  < > 30.3* 31.9* 28.7*  MCV 95.6  < > 95.3 97.6 96.3  PLT 260  < > 161 303 290  < > = values in this interval not displayed.    Assessment/Plan  Generalized weakness Will have patient work with PT/OT as tolerated to regain strength and restore function.  Fall precautions are in place.  Lumbar discitis Continue norco 10-325 mg 1-2 tab q6h prn pain. Back precautions. Will have him work with physical therapy and occupational therapy team to help with gait training and muscle strengthening exercises.fall precautions. Skin care. Encourage to be out of bed. Continue doxycycline 100 mg bid and vancomycin with f/u on trough level. Check cbc with diff and bmp, afebrile. Has f/u with neurosurgery  Intervertebral lumbar disc disorder Continue current pain regimen, back precautions and to work with therapy team  Constipation Continue colace 100 mg bid  gerd Start omeprazole 20 mg daily  Protein calorie malnutrition Get dietary consult. Start procel 30 cc tid with meals for now  DM Monitor cbg, continue SSI novolog  Osteoporosis Continue weekly fosamax  HLD Continue crestor  CKD stage 3 Monitor bmp  HTN Soft bp reading, off all antihypertensive at present, monitor BP    Goals of care: short term rehabilitation   Labs/tests ordered: cbc with diff, cmp 12/30/15  Family/ staff Communication: reviewed care plan with patient and nursing  supervisor    Blanchie Serve, MD  [place] [phone] (Monday-Friday 8 am - 5 pm) [phone] (afterhours)

## 2016-01-01 ENCOUNTER — Non-Acute Institutional Stay (SKILLED_NURSING_FACILITY): Payer: Medicare Other | Admitting: Nurse Practitioner

## 2016-01-01 DIAGNOSIS — K5900 Constipation, unspecified: Secondary | ICD-10-CM | POA: Diagnosis not present

## 2016-01-01 DIAGNOSIS — E119 Type 2 diabetes mellitus without complications: Secondary | ICD-10-CM | POA: Diagnosis not present

## 2016-01-01 DIAGNOSIS — M109 Gout, unspecified: Secondary | ICD-10-CM | POA: Diagnosis not present

## 2016-01-01 DIAGNOSIS — K219 Gastro-esophageal reflux disease without esophagitis: Secondary | ICD-10-CM

## 2016-01-01 DIAGNOSIS — M4646 Discitis, unspecified, lumbar region: Secondary | ICD-10-CM

## 2016-01-01 DIAGNOSIS — M5106 Intervertebral disc disorders with myelopathy, lumbar region: Secondary | ICD-10-CM

## 2016-01-01 NOTE — Progress Notes (Signed)
Patient ID: [name], male   DOB: September 06, 1934, 82 y.o.   MRN: [id number]    Nursing Home Location:  [place] of Service: SNF (31)  PCP: Mathews Argyle, MD  Allergies  Allergen Reactions  . Adacel [Diphth-Acell Pertussis-Tetanus]   . Flexeril [Cyclobenzaprine]     Causes confusion  . Lipitor [Atorvastatin] Other (See Comments)    Muscle pain  . Penicillins     UNKNOWN  . Septra [Sulfamethoxazole-Trimethoprim]   . Simvastatin     MUSCLE PAIN    Chief Complaint  Patient presents with  . Discharge Note    HPI:  Patient is a 82 y.o. male seen today at [facility]  For discharge home. Pt with hx of chronic pain, gout, DM, GERD, OA, CAD, OP. Pt orginally admitted after hospitalization from 10/25/15-10/31/15 with empyematous E.coli cystitis and sepsis. Then pt dx with Osteomyelitis of lumbar spine being treated with IV vanc and doxycycline following with Dr Trenton Gammon neurology, completes IV vanc on 01/03/16 and doxycyline will cont for another 2 weeks. Pain is controlled on current regimen. Patient currently doing well with therapy, now stable to discharge home with home health.    Review of Systems:  Review of Systems  Constitutional: Negative for activity change, appetite change, fatigue and unexpected weight change.  HENT: Negative for congestion and hearing loss.   Eyes: Negative.   Respiratory: Negative for cough and shortness of breath.   Cardiovascular: Negative for chest pain, palpitations and leg swelling.  Gastrointestinal: Negative for abdominal pain, diarrhea and constipation.  Genitourinary: Negative for dysuria and difficulty urinating.  Musculoskeletal: Positive for back pain (lumbar pain). Negative for myalgias and arthralgias.  Skin: Negative for color change and wound.  Neurological: Negative for dizziness and weakness.  Psychiatric/Behavioral: Negative for behavioral problems, confusion and agitation.    Past  Medical History  Diagnosis Date  . Hyperlipidemia   . Gout   . Glaucoma     "high normal"  . Diabetes mellitus without complication ([place])   . Myocardial infarction [facility])     "told me I had a small heart attack"  . GERD (gastroesophageal reflux disease)   . Cancer [facility]) 2010    prostate  . Arthritis   . H/O blood clots     R Lower leg, right upper leg  . Coronary artery disease   . Low back pain     "severe"  . Polymyalgia ([place])     h/o  . Osteoporosis   . Osteopenia   . Obesity   . Azotemia   . Thrombophlebitis   . Tinnitus   . Joint pain   . Heel spur   . Spinal stenosis   . Renal cyst, left   . Shingles     LEFT ABDOMEN  . Tubular adenoma   . Lesion of left native kidney   . Mild aortic stenosis   . Chondromalacia   . Hypertension    Past Surgical History  Procedure Laterality Date  . Prostate surgery      seed implant  . Coronary artery bypass graft  2007  . Vein ligation and stripping Right 1972    right lower leg  . Appendectomy  60 years ago  . Cardiac catheterization    . Robotic assited partial nephrectomy Left 08/31/2013    Procedure: ROBOTIC ASSITED PARTIAL NEPHRECTOMY;  Surgeon: Dutch Gray, MD;  Location: [facility];  Service: Urology;  Laterality: Left;  Social History:   reports that he quit smoking about 35 years ago. His smoking use included Cigarettes. He has a 90 pack-year smoking history. He has never used smokeless tobacco. He reports that he does not drink alcohol or use illicit drugs.  Family History  Problem Relation Age of Onset  . Heart Problems Mother   . Heart Problems Father     Medications: Patient's Medications  New Prescriptions   No medications on file  Previous Medications   ACETAMINOPHEN (TYLENOL) 325 MG TABLET    Take 1 tablet (325 mg total) by mouth every 6 (six) hours as needed for pain (1-2 tablets q 6 hrs prn pain).   ALENDRONATE (FOSAMAX) 70 MG TABLET    Take 70 mg by mouth daily. Take with a full glass of water on an  empty stomach. Patient takes this medication once weekly on Saturday.   ALLOPURINOL (ZYLOPRIM) 300 MG TABLET    Take 300 mg by mouth daily.   DOCUSATE SODIUM (COLACE) 100 MG CAPSULE    Take 1 capsule (100 mg total) by mouth 2 (two) times daily.   DOXYCYCLINE (VIBRAMYCIN) 100 MG CAPSULE    Take 100 mg by mouth 2 (two) times daily.   HYDROCODONE-ACETAMINOPHEN (NORCO) 10-325 MG TABLET    Take 1-2 tablets by mouth every 6 (six) hours as needed for moderate pain.   INSULIN ASPART (NOVOLOG) 100 UNIT/ML INJECTION    Inject 0-9 Units into the skin 3 (three) times daily with meals.   LACTOBACILLUS PO    Take 1 tablet by mouth daily.   OMEPRAZOLE (PRILOSEC) 20 MG CAPSULE    Take 20 mg by mouth daily.   ROSUVASTATIN (CRESTOR) 5 MG TABLET    Take 5 mg by mouth daily.   TAMSULOSIN (FLOMAX) 0.4 MG CAPS CAPSULE    Take 0.4 mg by mouth daily.   VANCOMYCIN 500 MG IN SODIUM CHLORIDE 0.9 % 100 ML    Inject 500 mg into the vein every 12 (twelve) hours.  Modified Medications   No medications on file  Discontinued Medications   No medications on file     Physical Exam: Filed Vitals:   01/01/16 1316  BP: 110/60  Pulse: 96  Temp: 97.6 F (36.4 C)  Resp: 20    Physical Exam  Constitutional: He is oriented to person, place, and time. He appears well-developed and well-nourished. No distress.  HENT:  Head: Normocephalic and atraumatic.  Mouth/Throat: Oropharynx is clear and moist. No oropharyngeal exudate.  Eyes: Conjunctivae and EOM are normal. Pupils are equal, round, and reactive to light.  Neck: Normal range of motion. Neck supple.  Cardiovascular: Normal rate, regular rhythm and normal heart sounds.   Pulmonary/Chest: Effort normal and breath sounds normal.  Abdominal: Soft. Bowel sounds are normal.  Musculoskeletal: He exhibits tenderness (to lumber spine and paraspinal tenderness to lumbar area, MAE X4). He exhibits no edema.  Neurological: He is alert and oriented to person, place, and time.    Skin: Skin is warm and dry. He is not diaphoretic.  Psychiatric: He has a normal mood and affect.    Labs reviewed: Basic Metabolic Panel:  Recent Labs  10/30/15 0317 12/22/15 0059 12/23/15 0522  NA 132* 141 140  K 3.9 4.1 3.8  CL 95* 105 108  CO2 26 24 24   GLUCOSE 230* 93 126*  BUN 18 9 7   CREATININE 1.65* 1.51* 1.35*  CALCIUM 8.5* 10.1 8.9   Liver Function Tests:  Recent Labs  10/25/15 2330  AST 26  ALT 17  ALKPHOS 85  BILITOT 1.1  PROT 6.9  ALBUMIN 2.7*    Recent Labs  10/25/15 2330  LIPASE 25   No results for input(s): AMMONIA in the last 8760 hours. CBC:  Recent Labs  10/25/15 2330  10/30/15 0317 12/22/15 0059 12/23/15 0522  WBC 17.9*  < > 7.0 8.6 7.6  NEUTROABS 16.6*  --   --  4.6  --   HGB 12.4*  < > 9.8* 10.4* 9.0*  HCT 37.0*  < > 30.3* 31.9* 28.7*  MCV 95.6  < > 95.3 97.6 96.3  PLT 260  < > 161 303 290  < > = values in this interval not displayed. TSH: No results for input(s): TSH in the last 8760 hours. A1C: Lab Results  Component Value Date   HGBA1C 8.1* 10/26/2015   Lipid Panel: No results for input(s): CHOL, HDL, LDLCALC, TRIG, CHOLHDL, LDLDIRECT in the last 8760 hours.    Assessment/Plan 1. Discitis of lumbar region Following with Dr Trenton Gammon Neurology, called to clarify antibiotics with Dr Orvis Brill office. Pt to complete Vanc and cont doxycyline 100 mg BID for additional month.  Staff to remove PICC once IV antibiotics complete Has follow up scheduled with Dr Trenton Gammon.   2. Intervertebral lumbar disc disorder with myelopathy, lumbar region Chronic pain of lumbar spine. conts on norco 10-325 mg 1-2 tablets q 6 hours PRN  3. Gout, unspecified cause, unspecified chronicity, unspecified site No recent flares, conts on allopurinol   4. Constipation, unspecified constipation type Has improved, well controlled at this time, will cont current regimen   5. Gastroesophageal reflux disease, esophagitis presence not specified -stable,  conts on omeprazole 20 mg daily   6. Diabetes mellitus without complication ([place]) Taken off oral medications in hospital and placed on SSI, CBGs at goal not requiring coverage frequently. Will DD SSI upon discharge and pt to follow up with PCP  pt is stable for discharge-will need PT  per home health. No DME needed. Rx written.  will need to follow up with PCP within 2 weeks.     Carlos American. Harle Battiest  [facility] [phone] 8 am - 5 pm) [phone] (after hours)

## 2016-04-30 ENCOUNTER — Other Ambulatory Visit: Payer: Self-pay | Admitting: Neurosurgery

## 2016-04-30 DIAGNOSIS — M47816 Spondylosis without myelopathy or radiculopathy, lumbar region: Secondary | ICD-10-CM

## 2016-05-06 ENCOUNTER — Ambulatory Visit
Admission: RE | Admit: 2016-05-06 | Discharge: 2016-05-06 | Disposition: A | Discharge status: Home / Self Care | Payer: Medicare Other | Source: Ambulatory Visit | Attending: Neurosurgery | Admitting: Neurosurgery

## 2016-05-06 DIAGNOSIS — M47816 Spondylosis without myelopathy or radiculopathy, lumbar region: Secondary | ICD-10-CM

## 2016-09-16 ENCOUNTER — Other Ambulatory Visit: Payer: Self-pay | Admitting: Geriatric Medicine

## 2016-09-16 DIAGNOSIS — I340 Nonrheumatic mitral (valve) insufficiency: Secondary | ICD-10-CM

## 2016-09-20 DIAGNOSIS — Z8679 Personal history of other diseases of the circulatory system: Secondary | ICD-10-CM

## 2016-09-20 HISTORY — DX: Personal history of other diseases of the circulatory system: Z86.79

## 2016-09-29 ENCOUNTER — Other Ambulatory Visit: Payer: Self-pay

## 2016-09-29 ENCOUNTER — Ambulatory Visit (HOSPITAL_COMMUNITY): Payer: Medicare Other | Attending: Cardiovascular Disease

## 2016-09-29 DIAGNOSIS — E119 Type 2 diabetes mellitus without complications: Secondary | ICD-10-CM | POA: Insufficient documentation

## 2016-09-29 DIAGNOSIS — I10 Essential (primary) hypertension: Secondary | ICD-10-CM | POA: Insufficient documentation

## 2016-09-29 DIAGNOSIS — E785 Hyperlipidemia, unspecified: Secondary | ICD-10-CM | POA: Insufficient documentation

## 2016-09-29 DIAGNOSIS — Z951 Presence of aortocoronary bypass graft: Secondary | ICD-10-CM | POA: Diagnosis not present

## 2016-09-29 DIAGNOSIS — Z87891 Personal history of nicotine dependence: Secondary | ICD-10-CM | POA: Insufficient documentation

## 2016-09-29 DIAGNOSIS — E669 Obesity, unspecified: Secondary | ICD-10-CM | POA: Diagnosis not present

## 2016-09-29 DIAGNOSIS — I252 Old myocardial infarction: Secondary | ICD-10-CM | POA: Insufficient documentation

## 2016-09-29 DIAGNOSIS — I2510 Atherosclerotic heart disease of native coronary artery without angina pectoris: Secondary | ICD-10-CM | POA: Diagnosis not present

## 2016-09-29 DIAGNOSIS — Z8249 Family history of ischemic heart disease and other diseases of the circulatory system: Secondary | ICD-10-CM | POA: Diagnosis not present

## 2016-09-29 DIAGNOSIS — Z6828 Body mass index (BMI) 28.0-28.9, adult: Secondary | ICD-10-CM | POA: Diagnosis not present

## 2016-09-29 DIAGNOSIS — I080 Rheumatic disorders of both mitral and aortic valves: Secondary | ICD-10-CM | POA: Diagnosis not present

## 2016-09-29 DIAGNOSIS — I340 Nonrheumatic mitral (valve) insufficiency: Secondary | ICD-10-CM | POA: Diagnosis not present

## 2016-10-07 ENCOUNTER — Encounter: Payer: Self-pay | Admitting: Physician Assistant

## 2016-10-07 NOTE — Progress Notes (Signed)
Cardiology Office Note    Date:  10/14/2016   ID:  [name], DOB [birth year], MRN [id number]  PCP:  Mathews Argyle, MD  Cardiologist:  Dr. Tamala Julian   CC: new systolic CHF  History of Present Illness:  [name] is a 82 y.o. 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   He was last seen by Dr. Tamala Julian in 01/2015. He was doing quite well at that time. He was cleared for back surgery. He had back surgery in 17/9150 which was complicated by discitis requiring abx via PICC line. He was admitted to [facility] in 10/2015 for urosepsis. He was admitted in 12/2015 for myoclonic jerking.   He was recently seen by Dr. Felipa Eth, his PCP. ECHO on 09/29/16 showed EF 20-25% with severe HK of the apicalanteroseptal, inferior and apical myocardium, G1DD, mild AR, mild MR. (Not sure why echo was ordered, patient denies symptoms). He was referred back to [place] with his newly reduced EF.  Today he presents to clinic for follow up. He has had a lot of fatigue since being discharged from the hospital. Just no energy. He denies chest pain or SOB. No LE edema, orthopnea or PND. No dizziness or syncope. No palpitations. He has been feeling normal except for fatigue.    Past Medical History:  Diagnosis Date  . Arthritis   . Azotemia   . Cancer [facility]) 2010   prostate  . Chondromalacia   . Coronary artery disease   . Diabetes mellitus without complication ([place])   . GERD (gastroesophageal reflux disease)   . Glaucoma    "high normal"  . Gout   . H/O blood clots    R Lower leg, right upper leg  . Heel spur   . Hyperlipidemia   . Hypertension   . Joint pain   . Lesion of left native kidney   . Low back pain    "severe"  . Mild aortic stenosis   . Myocardial infarction    "told me I had a small heart attack"  . Obesity   . Osteopenia   .  Osteoporosis   . Polymyalgia ([place])    h/o  . Renal cyst, left   . Shingles    LEFT ABDOMEN  . Spinal stenosis   . Thrombophlebitis   . Tinnitus   . Tubular adenoma     Past Surgical History:  Procedure Laterality Date  . APPENDECTOMY  60 years ago  . CARDIAC CATHETERIZATION    . CORONARY ARTERY BYPASS GRAFT  2007  . PROSTATE SURGERY     seed implant  . ROBOTIC ASSITED PARTIAL NEPHRECTOMY Left 08/31/2013   Procedure: ROBOTIC ASSITED PARTIAL NEPHRECTOMY;  Surgeon: Dutch Gray, MD;  Location: [facility];  Service: Urology;  Laterality: Left;  Marland Kitchen VEIN LIGATION AND STRIPPING Right 1972   right lower leg    Current Medications: Outpatient Medications Prior to Visit  Medication Sig Dispense Refill  . alendronate (FOSAMAX) 70 MG tablet Take 70 mg by mouth daily. Take with a full glass of water on an empty stomach. Patient takes this medication once weekly on Saturday.    Marland Kitchen allopurinol (ZYLOPRIM) 300 MG tablet Take 300 mg by mouth daily.  2  . docusate sodium (COLACE) 100 MG capsule Take 1 capsule (100 mg total) by mouth 2 (two) times daily.  10 capsule 0  . HYDROcodone-acetaminophen (NORCO) 10-325 MG tablet Take 1-2 tablets by mouth every 6 (six) hours as needed for moderate pain. 10 tablet 0  . rosuvastatin (CRESTOR) 5 MG tablet Take 5 mg by mouth daily.  5  . tamsulosin (FLOMAX) 0.4 MG CAPS capsule Take 0.4 mg by mouth daily.  0  . acetaminophen (TYLENOL) 325 MG tablet Take 1 tablet (325 mg total) by mouth every 6 (six) hours as needed for pain (1-2 tablets q 6 hrs prn pain). (Patient taking differently: Take 650 mg by mouth every 6 (six) hours as needed (1-2 tablets q 6 hrs prn pain). ) 30 tablet 0  . insulin aspart (NOVOLOG) 100 UNIT/ML injection Inject 0-9 Units into the skin 3 (three) times daily with meals. 10 mL 11  . LACTOBACILLUS PO Take 1 tablet by mouth daily.    Marland Kitchen omeprazole (PRILOSEC) 20 MG capsule Take 20 mg by mouth daily.     No facility-administered medications prior to  visit.      Allergies:   Adacel [diphth-acell pertussis-tetanus]; Flexeril [cyclobenzaprine]; Lipitor [atorvastatin]; Pantoprazole; Penicillins; Septra [sulfamethoxazole-trimethoprim]; and Simvastatin   Social History   Social History  . Marital status: Married    Spouse name: N/A  . Number of children: N/A  . Years of education: N/A   Social History Main Topics  . Smoking status: Former Smoker    Packs/day: 3.00    Years: 30.00    Types: Cigarettes    Quit date: 12/21/1980  . Smokeless tobacco: Never Used  . Alcohol use No  . Drug use: No  . Sexual activity: Not Asked   Other Topics Concern  . None   Social History Narrative  . None     Family History:  The patient's family history includes Heart Problems in his father and mother.     ROS:   Please see the history of present illness.    ROS All other systems reviewed and are negative.   PHYSICAL EXAM:   VS:  BP 132/82   Pulse 80   Ht 5' 6.5" (1.689 m)   Wt 193 lb (87.5 kg)   BMI 30.68 kg/m    GEN: Well nourished, well developed, in no acute distress  HEENT: normal  Neck: no JVD, carotid bruits, or masses Cardiac: RRR; + murmurs, rubs, or gallops,no edema  Respiratory:  clear to auscultation bilaterally, normal work of breathing GI: soft, nontender, nondistended, + BS MS: no deformity or atrophy  Skin: warm and dry, no rash Neuro:  Alert and Oriented x 3, Strength and sensation are intact Psych: euthymic mood, full affect  Wt Readings from Last 3 Encounters:  10/14/16 193 lb (87.5 kg)  12/22/15 176 lb (79.8 kg)  11/22/15 173 lb 4 oz (78.6 kg)      Studies/Labs Reviewed:   EKG:  EKG is ordered today.  The ekg ordered today demonstrates NSR with LVH with repol changes.   Recent Labs: 10/25/2015: ALT 17 12/23/2015: BUN 7; Creatinine, Ser 1.35; Hemoglobin 9.0; Platelets 290; Potassium 3.8; Sodium 140   Lipid Panel No results found for: CHOL, TRIG, HDL, CHOLHDL, VLDL, LDLCALC, LDLDIRECT  Additional  studies/ records that were reviewed today include:  2D ECHO: 09/29/2016 LV EF: 20% -   25% Study Conclusions - Left ventricle: The cavity size was mildly dilated. Wall   thickness was increased in a pattern of mild LVH. Systolic   function was severely reduced. The estimated ejection fraction   was in the range  of 20% to 25%. Severe hypokinesis of the   apicalanteroseptal, inferior, and apical myocardium. Doppler   parameters are consistent with abnormal left ventricular   relaxation (grade 1 diastolic dysfunction). - Aortic valve: There was mild stenosis. Valve area (VTI): 1.14   cm^2. Valve area (Vmax): 1.27 cm^2. Valve area (Vmean): 0.96   cm^2. - Mitral valve: Moderately calcified annulus. There was mild   regurgitation.  Cath 2007 CONCLUSIONS:  1.  Severe coronary artery disease with chronic total occlusion of right      coronary collateralized from the left atrial recurrent in LAD as well as      an acute marginal branch of the right coronary.  2.  There is 70% stenosis in the distal left main.  There is 80% stenosis in      the proximal LAD and there is 80% stenosis in the ramus intermedius      branch.  Small circumflex is free of significant stenosis.  3.  Overall normal LV function.  PLAN:  The patient needs to have coronary bypass grafting.  He has had a  gradual development of his symptoms over the last 6 months to a year.  He is  having no acute coronary syndrome type symptoms at this time.  We will go  ahead and get him set up to see Dr. Servando Snare as an outpatient as soon as  possible on April 01, 2006.  Hopefully, surgery can be done within the next  7 days or so.  ASSESSMENT & PLAN:    New cardiomyopathy: EF now down to 20-25% which is new. I don't seen any LV assessments prior to this besides LV gram on heart cath which showed normal LV function. He has not had any chest pain and has no s/s volume overload. NHYA functional class 1B -- Will start Coreg 3.125 mg  BID. No ACE/ARB given CKD. Given his CKD with hx of RCC s/p partial nephrectomy and his lack of symptoms, I will start with a nuclear stress test to evaluate for ischemia. Plan to bring him back into the office in a couple weeks for close follow up and titration of BB.   CAD s/p CABG: will arrange nuclear stress test as above. He is on a baby ASA, will add this to his med list. Continue statin. Add BB as above.  HTN: BP well controlled on no medications. WIll add Coreg in the setting of cardiomyopathy.   HLD: continue Crestor  CKD/RCC s/p partial nephrectomy: last creat was ~1.3 in 12/2015. Labs just done at Dr. Carlyle Lipa office, will have these faxed to [place].    Medication Adjustments/Labs and Tests Ordered: Current medicines are reviewed at length with the patient today.  Concerns regarding medicines are outlined above.  Medication changes, Labs and Tests ordered today are listed in the Patient Instructions below. Patient Instructions  Medication Instructions:  Your physician has recommended you make the following change in your medication:  1.  START Coreg 3.125 taking 1 tablet twice a day   Labwork: None ordered  Testing/Procedures: Your physician has requested that you have a lexiscan myoview. For further information please visit [email or web address]. Please follow instruction sheet, as given.   Follow-Up: Your physician recommends that you schedule a follow-up appointment in: 1-2 WEEKS AFTER THE LEXISCAN TO SEE KATIE Elaria Osias, PA-C   Any Other Special Instructions Will Be Listed Below (If Applicable). Pharmacologic Stress Electrocardiogram A pharmacologic stress electrocardiogram is a heart (cardiac) test that uses nuclear imaging to evaluate the  blood supply to your heart. This test may also be called a pharmacologic stress electrocardiography. Pharmacologic means that a medicine is used to increase your heart rate and blood pressure.  This stress test is done to find areas of  poor blood flow to the heart by determining the extent of coronary artery disease (CAD). Some people exercise on a treadmill, which naturally increases the blood flow to the heart. For those people unable to exercise on a treadmill, a medicine is used. This medicine stimulates your heart and will cause your heart to beat harder and more quickly, as if you were exercising.  Pharmacologic stress tests can help determine:  The adequacy of blood flow to your heart during increased levels of activity in order to clear you for discharge home.  The extent of coronary artery blockage caused by CAD.  Your prognosis if you have suffered a heart attack.  The effectiveness of cardiac procedures done, such as an angioplasty, which can increase the circulation in your coronary arteries.  Causes of chest pain or pressure. LET [facility] CARE PROVIDER KNOW ABOUT:  Any allergies you have.  All medicines you are taking, including vitamins, herbs, eye drops, creams, and over-the-counter medicines.  Previous problems you or members of your family have had with the use of anesthetics.  Any blood disorders you have.  Previous surgeries you have had.  Medical conditions you have.  Possibility of pregnancy, if this applies.  If you are currently breastfeeding. RISKS AND COMPLICATIONS Generally, this is a safe procedure. However, as with any procedure, complications can occur. Possible complications include:  You develop pain or pressure in the following areas:  Chest.  Jaw or neck.  Between your shoulder blades.  Radiating down your left arm.  Headache.  Dizziness or light-headedness.  Shortness of breath.  Increased or irregular heartbeat.  Low blood pressure.  Nausea or vomiting.  Flushing.  Redness going up the arm and slight pain during injection of medicine.  Heart attack (rare). BEFORE THE PROCEDURE   Avoid all forms of caffeine for 24 hours before your test or as directed  by your health care provider. This includes coffee, tea (even decaffeinated tea), caffeinated sodas, chocolate, cocoa, and certain pain medicines.  Follow your health care provider's instructions regarding eating and drinking before the test.  Take your medicines as directed at regular times with water unless instructed otherwise. Exceptions may include:  If you have diabetes, ask how you are to take your insulin or pills. It is common to adjust insulin dosing the morning of the test.  If you are taking beta-blocker medicines, it is important to talk to your health care provider about these medicines well before the date of your test. Taking beta-blocker medicines may interfere with the test. In some cases, these medicines need to be changed or stopped 24 hours or more before the test.  If you wear a nitroglycerin patch, it may need to be removed prior to the test. Ask your health care provider if the patch should be removed before the test.  If you use an inhaler for any breathing condition, bring it with you to the test.  If you are an outpatient, bring a snack so you can eat right after the stress phase of the test.  Do not smoke for 4 hours prior to the test or as directed by your health care provider.  Do not apply lotions, powders, creams, or oils on your chest prior to the test.  Wear  comfortable shoes and clothing. Let your health care provider know if you were unable to complete or follow the preparations for your test. PROCEDURE   Multiple patches (electrodes) will be put on your chest. If needed, small areas of your chest may be shaved to get better contact with the electrodes. Once the electrodes are attached to your body, multiple wires will be attached to the electrodes, and your heart rate will be monitored.  An IV access will be started. A nuclear trace (isotope) is given. The isotope may be given intravenously, or it may be swallowed. Nuclear refers to several types of  radioactive isotopes, and the nuclear isotope lights up the arteries so that the nuclear images are clear. The isotope is absorbed by your body. This results in low radiation exposure.  A resting nuclear image is taken to show how your heart functions at rest.  A medicine is given through the IV access.  A second scan is done about 1 hour after the medicine injection and determines how your heart functions under stress.  During this stress phase, you will be connected to an electrocardiogram machine. Your blood pressure and oxygen levels will be monitored. AFTER THE PROCEDURE   Your heart rate and blood pressure will be monitored after the test.  You may return to your normal schedule, including diet,activities, and medicines, unless your health care provider tells you otherwise.   This information is not intended to replace advice given to you by your health care provider. Make sure you discuss any questions you have with your health care provider.   Document Released: 04/25/2009 Document Revised: 12/12/2013 Document Reviewed: 08/14/2013 Elsevier Interactive Patient Education [employer].     If you need a refill on your cardiac medications before your next appointment, please call your pharmacy.      Signed, Angelena Form, PA-C  10/14/2016 9:12 AM    [facility] [place], [place], [place]  [address] Phone: [phone]; Fax: [phone]

## 2016-10-14 ENCOUNTER — Other Ambulatory Visit: Payer: Self-pay

## 2016-10-14 ENCOUNTER — Ambulatory Visit (INDEPENDENT_AMBULATORY_CARE_PROVIDER_SITE_OTHER): Payer: Medicare Other | Admitting: Physician Assistant

## 2016-10-14 ENCOUNTER — Encounter: Payer: Self-pay | Admitting: Physician Assistant

## 2016-10-14 VITALS — BP 132/82 | HR 80 | Ht 66.5 in | Wt 193.0 lb

## 2016-10-14 DIAGNOSIS — N183 Chronic kidney disease, stage 3 unspecified: Secondary | ICD-10-CM

## 2016-10-14 DIAGNOSIS — C642 Malignant neoplasm of left kidney, except renal pelvis: Secondary | ICD-10-CM

## 2016-10-14 DIAGNOSIS — E785 Hyperlipidemia, unspecified: Secondary | ICD-10-CM

## 2016-10-14 DIAGNOSIS — I25810 Atherosclerosis of coronary artery bypass graft(s) without angina pectoris: Secondary | ICD-10-CM

## 2016-10-14 DIAGNOSIS — I10 Essential (primary) hypertension: Secondary | ICD-10-CM

## 2016-10-14 DIAGNOSIS — I429 Cardiomyopathy, unspecified: Secondary | ICD-10-CM | POA: Diagnosis not present

## 2016-10-14 MED ORDER — CARVEDILOL 3.125 MG PO TABS: 3.1250 mg | ORAL_TABLET | Freq: Two times a day (BID) | ORAL | 1 refills | Status: DC

## 2016-10-14 NOTE — Patient Instructions (Addendum)
Medication Instructions:  Your physician has recommended you make the following change in your medication:  1.  START Coreg 3.125 taking 1 tablet twice a day   Labwork: None ordered  Testing/Procedures: Your physician has requested that you have a lexiscan myoview. For further information please visit [email or web address]. Please follow instruction sheet, as given.   Follow-Up: Your physician recommends that you schedule a follow-up appointment in: 1-2 WEEKS AFTER THE LEXISCAN TO SEE Steven THOMPSON, PA-C   Any Other Special Instructions Will Be Listed Below (If Applicable). Pharmacologic Stress Electrocardiogram A pharmacologic stress electrocardiogram is a heart (cardiac) test that uses nuclear imaging to evaluate the blood supply to your heart. This test may also be called a pharmacologic stress electrocardiography. Pharmacologic means that a medicine is used to increase your heart rate and blood pressure.  This stress test is done to find areas of poor blood flow to the heart by determining the extent of coronary artery disease (CAD). Some people exercise on a treadmill, which naturally increases the blood flow to the heart. For those people unable to exercise on a treadmill, a medicine is used. This medicine stimulates your heart and will cause your heart to beat harder and more quickly, as if you were exercising.  Pharmacologic stress tests can help determine:  The adequacy of blood flow to your heart during increased levels of activity in order to clear you for discharge home.  The extent of coronary artery blockage caused by CAD.  Your prognosis if you have suffered a heart attack.  The effectiveness of cardiac procedures done, such as an angioplasty, which can increase the circulation in your coronary arteries.  Causes of chest pain or pressure. LET [facility] CARE PROVIDER KNOW ABOUT:  Any allergies you have.  All medicines you are taking, including vitamins, herbs, eye  drops, creams, and over-the-counter medicines.  Previous problems you or members of your family have had with the use of anesthetics.  Any blood disorders you have.  Previous surgeries you have had.  Medical conditions you have.  Possibility of pregnancy, if this applies.  If you are currently breastfeeding. RISKS AND COMPLICATIONS Generally, this is a safe procedure. However, as with any procedure, complications can occur. Possible complications include:  You develop pain or pressure in the following areas:  Chest.  Jaw or neck.  Between your shoulder blades.  Radiating down your left arm.  Headache.  Dizziness or light-headedness.  Shortness of breath.  Increased or irregular heartbeat.  Low blood pressure.  Nausea or vomiting.  Flushing.  Redness going up the arm and slight pain during injection of medicine.  Heart attack (rare). BEFORE THE PROCEDURE   Avoid all forms of caffeine for 24 hours before your test or as directed by your health care provider. This includes coffee, tea (even decaffeinated tea), caffeinated sodas, chocolate, cocoa, and certain pain medicines.  Follow your health care provider's instructions regarding eating and drinking before the test.  Take your medicines as directed at regular times with water unless instructed otherwise. Exceptions may include:  If you have diabetes, ask how you are to take your insulin or pills. It is common to adjust insulin dosing the morning of the test.  If you are taking beta-blocker medicines, it is important to talk to your health care provider about these medicines well before the date of your test. Taking beta-blocker medicines may interfere with the test. In some cases, these medicines need to be changed or stopped 24 hours or more  before the test.  If you wear a nitroglycerin patch, it may need to be removed prior to the test. Ask your health care provider if the patch should be removed before the  test.  If you use an inhaler for any breathing condition, bring it with you to the test.  If you are an outpatient, bring a snack so you can eat right after the stress phase of the test.  Do not smoke for 4 hours prior to the test or as directed by your health care provider.  Do not apply lotions, powders, creams, or oils on your chest prior to the test.  Wear comfortable shoes and clothing. Let your health care provider know if you were unable to complete or follow the preparations for your test. PROCEDURE   Multiple patches (electrodes) will be put on your chest. If needed, small areas of your chest may be shaved to get better contact with the electrodes. Once the electrodes are attached to your body, multiple wires will be attached to the electrodes, and your heart rate will be monitored.  An IV access will be started. A nuclear trace (isotope) is given. The isotope may be given intravenously, or it may be swallowed. Nuclear refers to several types of radioactive isotopes, and the nuclear isotope lights up the arteries so that the nuclear images are clear. The isotope is absorbed by your body. This results in low radiation exposure.  A resting nuclear image is taken to show how your heart functions at rest.  A medicine is given through the IV access.  A second scan is done about 1 hour after the medicine injection and determines how your heart functions under stress.  During this stress phase, you will be connected to an electrocardiogram machine. Your blood pressure and oxygen levels will be monitored. AFTER THE PROCEDURE   Your heart rate and blood pressure will be monitored after the test.  You may return to your normal schedule, including diet,activities, and medicines, unless your health care provider tells you otherwise.   This information is not intended to replace advice given to you by your health care provider. Make sure you discuss any questions you have with your health  care provider.   Document Released: 04/25/2009 Document Revised: 12/12/2013 Document Reviewed: 08/14/2013 Elsevier Interactive Patient Education [employer].     If you need a refill on your cardiac medications before your next appointment, please call your pharmacy.

## 2016-10-19 ENCOUNTER — Telehealth (HOSPITAL_COMMUNITY): Payer: Self-pay | Admitting: *Deleted

## 2016-10-19 NOTE — Telephone Encounter (Signed)
Patient's wife per DPR given detailed instructions per Myocardial Perfusion Study Information Sheet for the test on 10/22/16 at 0730. Patient notified to arrive 15 minutes early and that it is imperative to arrive on time for appointment to keep from having the test rescheduled.  If you need to cancel or reschedule your appointment, please call the office within 24 hours of your appointment. Failure to do so may result in a cancellation of your appointment, and a $50 no show fee. Patient verbalized understanding.Riaan Toledo, Ranae Palms

## 2016-10-20 ENCOUNTER — Encounter: Payer: Self-pay | Admitting: Physician Assistant

## 2016-10-22 ENCOUNTER — Ambulatory Visit (HOSPITAL_COMMUNITY): Payer: Medicare Other | Attending: Cardiology

## 2016-10-22 DIAGNOSIS — I429 Cardiomyopathy, unspecified: Secondary | ICD-10-CM | POA: Insufficient documentation

## 2016-10-22 DIAGNOSIS — R9439 Abnormal result of other cardiovascular function study: Secondary | ICD-10-CM | POA: Diagnosis not present

## 2016-10-22 DIAGNOSIS — R6889 Other general symptoms and signs: Secondary | ICD-10-CM | POA: Insufficient documentation

## 2016-10-22 LAB — MYOCARDIAL PERFUSION IMAGING
CHL CUP NUCLEAR SRS: 7
CHL CUP NUCLEAR SSS: 8
CSEPPHR: 88 {beats}/min
LV dias vol: 225 mL (ref 62–150)
LV sys vol: 173 mL
RATE: 0.3
Rest HR: 68 {beats}/min
SDS: 4
TID: 1.06

## 2016-10-22 MED ORDER — REGADENOSON 0.4 MG/5ML IV SOLN
0.4000 mg | Freq: Once | INTRAVENOUS | Status: AC
  Administered 2016-10-22: 0.4 mg via INTRAVENOUS

## 2016-10-22 MED ORDER — TECHNETIUM TC 99M TETROFOSMIN IV KIT
10.2000 | PACK | Freq: Once | INTRAVENOUS | Status: AC | PRN
  Administered 2016-10-22: 10.2 via INTRAVENOUS
  Filled 2016-10-22: qty 11

## 2016-10-22 MED ORDER — TECHNETIUM TC 99M TETROFOSMIN IV KIT
31.6000 | PACK | Freq: Once | INTRAVENOUS | Status: AC | PRN
  Administered 2016-10-22: 31.6 via INTRAVENOUS
  Filled 2016-10-22: qty 32

## 2016-10-26 ENCOUNTER — Telehealth: Payer: Self-pay

## 2016-10-26 DIAGNOSIS — Z01812 Encounter for preprocedural laboratory examination: Secondary | ICD-10-CM

## 2016-10-26 NOTE — Telephone Encounter (Signed)
Called patient to set up heart cath. Patient will have done on November 14 th with Dr. Tamala Julian. Given instructions to patient. Patient will come in on Thursday for lab work. Patient verbalized understanding and will also see instructions on MyChart.

## 2016-10-26 NOTE — Telephone Encounter (Signed)
-----   Message from Eileen Stanford, PA-C sent at 10/26/2016  8:49 AM EST ----- Can we get [facility] set up? Discussed with patient and he is okay to just go in for cath.

## 2016-10-26 NOTE — Addendum Note (Signed)
Addended by: Angelena Form R on: 10/26/2016 10:06 AM   Modules accepted: Orders, SmartSet

## 2016-10-29 ENCOUNTER — Other Ambulatory Visit: Payer: Medicare Other | Admitting: *Deleted

## 2016-10-29 DIAGNOSIS — Z01812 Encounter for preprocedural laboratory examination: Secondary | ICD-10-CM

## 2016-10-29 LAB — BASIC METABOLIC PANEL
BUN: 23 mg/dL (ref 7–25)
CO2: 26 mmol/L (ref 20–31)
CREATININE: 1.77 mg/dL — AB (ref 0.70–1.11)
Calcium: 9.4 mg/dL (ref 8.6–10.3)
Chloride: 103 mmol/L (ref 98–110)
Glucose, Bld: 142 mg/dL — ABNORMAL HIGH (ref 65–99)
Potassium: 4.4 mmol/L (ref 3.5–5.3)
Sodium: 140 mmol/L (ref 135–146)

## 2016-10-29 LAB — CBC WITH DIFFERENTIAL/PLATELET
BASOS PCT: 0 %
Basophils Absolute: 0 cells/uL (ref 0–200)
Eosinophils Absolute: 170 cells/uL (ref 15–500)
Eosinophils Relative: 2 %
HCT: 37 % — ABNORMAL LOW (ref 38.5–50.0)
Hemoglobin: 12.4 g/dL — ABNORMAL LOW (ref 13.2–17.1)
LYMPHS PCT: 19 %
Lymphs Abs: 1615 cells/uL (ref 850–3900)
MCH: 33.3 pg — ABNORMAL HIGH (ref 27.0–33.0)
MCHC: 33.5 g/dL (ref 32.0–36.0)
MCV: 99.5 fL (ref 80.0–100.0)
MONOS PCT: 4 %
MPV: 10.1 fL (ref 7.5–12.5)
Monocytes Absolute: 340 cells/uL (ref 200–950)
NEUTROS PCT: 75 %
Neutro Abs: 6375 cells/uL (ref 1500–7800)
PLATELETS: 221 10*3/uL (ref 140–400)
RBC: 3.72 MIL/uL — AB (ref 4.20–5.80)
RDW: 14.9 % (ref 11.0–15.0)
WBC: 8.5 10*3/uL (ref 3.8–10.8)

## 2016-10-29 LAB — PROTIME-INR
INR: 1
PROTHROMBIN TIME: 10.7 s (ref 9.0–11.5)

## 2016-11-02 ENCOUNTER — Ambulatory Visit: Payer: Medicare Other | Admitting: Physician Assistant

## 2016-11-02 DIAGNOSIS — I5023 Acute on chronic systolic (congestive) heart failure: Secondary | ICD-10-CM

## 2016-11-03 ENCOUNTER — Telehealth: Payer: Self-pay | Admitting: Interventional Cardiology

## 2016-11-03 ENCOUNTER — Encounter (HOSPITAL_COMMUNITY)
Admission: RE | Disposition: A | Discharge status: Home / Self Care | Payer: Self-pay | Source: Ambulatory Visit | Attending: Interventional Cardiology

## 2016-11-03 ENCOUNTER — Encounter (HOSPITAL_COMMUNITY): Payer: Self-pay | Admitting: Interventional Cardiology

## 2016-11-03 ENCOUNTER — Ambulatory Visit (HOSPITAL_COMMUNITY)
Admission: RE | Admit: 2016-11-03 | Discharge: 2016-11-03 | Disposition: A | Discharge status: Home / Self Care | Payer: Medicare Other | Source: Ambulatory Visit | Attending: Interventional Cardiology | Admitting: Interventional Cardiology

## 2016-11-03 DIAGNOSIS — I2582 Chronic total occlusion of coronary artery: Secondary | ICD-10-CM | POA: Diagnosis not present

## 2016-11-03 DIAGNOSIS — Z794 Long term (current) use of insulin: Secondary | ICD-10-CM | POA: Diagnosis not present

## 2016-11-03 DIAGNOSIS — Z79899 Other long term (current) drug therapy: Secondary | ICD-10-CM | POA: Diagnosis not present

## 2016-11-03 DIAGNOSIS — I252 Old myocardial infarction: Secondary | ICD-10-CM | POA: Diagnosis not present

## 2016-11-03 DIAGNOSIS — E785 Hyperlipidemia, unspecified: Secondary | ICD-10-CM | POA: Diagnosis not present

## 2016-11-03 DIAGNOSIS — Z905 Acquired absence of kidney: Secondary | ICD-10-CM | POA: Diagnosis not present

## 2016-11-03 DIAGNOSIS — Z85528 Personal history of other malignant neoplasm of kidney: Secondary | ICD-10-CM | POA: Insufficient documentation

## 2016-11-03 DIAGNOSIS — Z8249 Family history of ischemic heart disease and other diseases of the circulatory system: Secondary | ICD-10-CM | POA: Diagnosis not present

## 2016-11-03 DIAGNOSIS — Z923 Personal history of irradiation: Secondary | ICD-10-CM | POA: Insufficient documentation

## 2016-11-03 DIAGNOSIS — N189 Chronic kidney disease, unspecified: Secondary | ICD-10-CM | POA: Diagnosis not present

## 2016-11-03 DIAGNOSIS — Z87891 Personal history of nicotine dependence: Secondary | ICD-10-CM | POA: Diagnosis not present

## 2016-11-03 DIAGNOSIS — I5021 Acute systolic (congestive) heart failure: Secondary | ICD-10-CM | POA: Diagnosis not present

## 2016-11-03 DIAGNOSIS — N183 Chronic kidney disease, stage 3 unspecified: Secondary | ICD-10-CM | POA: Diagnosis present

## 2016-11-03 DIAGNOSIS — I350 Nonrheumatic aortic (valve) stenosis: Secondary | ICD-10-CM | POA: Diagnosis not present

## 2016-11-03 DIAGNOSIS — I5023 Acute on chronic systolic (congestive) heart failure: Secondary | ICD-10-CM

## 2016-11-03 DIAGNOSIS — Z951 Presence of aortocoronary bypass graft: Secondary | ICD-10-CM | POA: Insufficient documentation

## 2016-11-03 DIAGNOSIS — E119 Type 2 diabetes mellitus without complications: Secondary | ICD-10-CM | POA: Diagnosis not present

## 2016-11-03 DIAGNOSIS — Z7982 Long term (current) use of aspirin: Secondary | ICD-10-CM | POA: Insufficient documentation

## 2016-11-03 DIAGNOSIS — I25810 Atherosclerosis of coronary artery bypass graft(s) without angina pectoris: Secondary | ICD-10-CM | POA: Insufficient documentation

## 2016-11-03 DIAGNOSIS — I429 Cardiomyopathy, unspecified: Secondary | ICD-10-CM | POA: Diagnosis not present

## 2016-11-03 DIAGNOSIS — I110 Hypertensive heart disease with heart failure: Secondary | ICD-10-CM | POA: Insufficient documentation

## 2016-11-03 DIAGNOSIS — K219 Gastro-esophageal reflux disease without esophagitis: Secondary | ICD-10-CM | POA: Diagnosis not present

## 2016-11-03 DIAGNOSIS — I219 Acute myocardial infarction, unspecified: Secondary | ICD-10-CM | POA: Diagnosis present

## 2016-11-03 DIAGNOSIS — Z8546 Personal history of malignant neoplasm of prostate: Secondary | ICD-10-CM | POA: Insufficient documentation

## 2016-11-03 DIAGNOSIS — E1169 Type 2 diabetes mellitus with other specified complication: Secondary | ICD-10-CM | POA: Diagnosis present

## 2016-11-03 HISTORY — PX: CARDIAC CATHETERIZATION: SHX172

## 2016-11-03 LAB — POCT I-STAT 3, VENOUS BLOOD GAS (G3P V)
Bicarbonate: 26.1 mmol/L (ref 20.0–28.0)
O2 SAT: 68 %
TCO2: 28 mmol/L (ref 0–100)
pCO2, Ven: 48.9 mmHg (ref 44.0–60.0)
pH, Ven: 7.336 (ref 7.250–7.430)
pO2, Ven: 38 mmHg (ref 32.0–45.0)

## 2016-11-03 LAB — GLUCOSE, CAPILLARY
GLUCOSE-CAPILLARY: 77 mg/dL (ref 65–99)
GLUCOSE-CAPILLARY: 93 mg/dL (ref 65–99)

## 2016-11-03 SURGERY — RIGHT/LEFT HEART CATH AND CORONARY/GRAFT ANGIOGRAPHY

## 2016-11-03 MED ORDER — HEPARIN (PORCINE) IN NACL 2-0.9 UNIT/ML-% IJ SOLN
INTRAMUSCULAR | Status: AC
  Filled 2016-11-03: qty 1000

## 2016-11-03 MED ORDER — HEPARIN SODIUM (PORCINE) 1000 UNIT/ML IJ SOLN
INTRAMUSCULAR | Status: AC
  Filled 2016-11-03: qty 1

## 2016-11-03 MED ORDER — HEPARIN (PORCINE) IN NACL 2-0.9 UNIT/ML-% IJ SOLN
INTRAMUSCULAR | Status: DC | PRN
  Administered 2016-11-03: 1500 mL

## 2016-11-03 MED ORDER — ONDANSETRON HCL 4 MG/2ML IJ SOLN: 4.0000 mg | Freq: Four times a day (QID) | INTRAMUSCULAR | Status: DC | PRN

## 2016-11-03 MED ORDER — SODIUM CHLORIDE 0.9 % IV SOLN: 250.0000 mL | INTRAVENOUS | Status: DC | PRN

## 2016-11-03 MED ORDER — LIDOCAINE HCL (PF) 1 % IJ SOLN
INTRAMUSCULAR | Status: DC | PRN
  Administered 2016-11-03 (×2): 2 mL via INTRADERMAL

## 2016-11-03 MED ORDER — SODIUM CHLORIDE 0.9 % WEIGHT BASED INFUSION: 1.0000 mL/kg/h | INTRAVENOUS | Status: DC

## 2016-11-03 MED ORDER — HEPARIN SODIUM (PORCINE) 1000 UNIT/ML IJ SOLN
INTRAMUSCULAR | Status: DC | PRN
  Administered 2016-11-03: 5000 [IU] via INTRAVENOUS

## 2016-11-03 MED ORDER — HEPARIN (PORCINE) IN NACL 2-0.9 UNIT/ML-% IJ SOLN
INTRAMUSCULAR | Status: DC | PRN
  Administered 2016-11-03: 10 mL via INTRA_ARTERIAL

## 2016-11-03 MED ORDER — SODIUM CHLORIDE 0.9 % WEIGHT BASED INFUSION
3.0000 mL/kg/h | INTRAVENOUS | Status: AC
  Administered 2016-11-03: 3 mL/kg/h via INTRAVENOUS

## 2016-11-03 MED ORDER — VERAPAMIL HCL 2.5 MG/ML IV SOLN
INTRAVENOUS | Status: AC
  Filled 2016-11-03: qty 2

## 2016-11-03 MED ORDER — IOPAMIDOL (ISOVUE-370) INJECTION 76%
INTRAVENOUS | Status: AC
  Filled 2016-11-03: qty 125

## 2016-11-03 MED ORDER — FENTANYL CITRATE (PF) 100 MCG/2ML IJ SOLN
INTRAMUSCULAR | Status: AC
  Filled 2016-11-03: qty 2

## 2016-11-03 MED ORDER — SODIUM CHLORIDE 0.9% FLUSH: 3.0000 mL | INTRAVENOUS | Status: DC | PRN

## 2016-11-03 MED ORDER — SODIUM CHLORIDE 0.9% FLUSH: 3.0000 mL | Freq: Two times a day (BID) | INTRAVENOUS | Status: DC

## 2016-11-03 MED ORDER — ISOSORB DINITRATE-HYDRALAZINE 20-37.5 MG PO TABS: 1.0000 | ORAL_TABLET | Freq: Two times a day (BID) | ORAL | 11 refills | Status: DC

## 2016-11-03 MED ORDER — LIDOCAINE HCL (PF) 1 % IJ SOLN
INTRAMUSCULAR | Status: AC
  Filled 2016-11-03: qty 30

## 2016-11-03 MED ORDER — OXYCODONE-ACETAMINOPHEN 5-325 MG PO TABS: 1.0000 | ORAL_TABLET | ORAL | Status: DC | PRN

## 2016-11-03 MED ORDER — ACETAMINOPHEN 325 MG PO TABS: 650.0000 mg | ORAL_TABLET | ORAL | Status: DC | PRN

## 2016-11-03 MED ORDER — MIDAZOLAM HCL 2 MG/2ML IJ SOLN
INTRAMUSCULAR | Status: DC | PRN
  Administered 2016-11-03 (×2): 1 mg via INTRAVENOUS

## 2016-11-03 MED ORDER — ISOSORBIDE MONONITRATE ER 30 MG PO TB24: 30.0000 mg | ORAL_TABLET | Freq: Every day | ORAL | 3 refills | Status: DC

## 2016-11-03 MED ORDER — ISOSORB DINITRATE-HYDRALAZINE 20-37.5 MG PO TABS: 1.0000 | ORAL_TABLET | Freq: Two times a day (BID) | ORAL | Status: DC

## 2016-11-03 MED ORDER — FENTANYL CITRATE (PF) 100 MCG/2ML IJ SOLN
INTRAMUSCULAR | Status: DC | PRN
  Administered 2016-11-03: 50 ug via INTRAVENOUS

## 2016-11-03 MED ORDER — MIDAZOLAM HCL 2 MG/2ML IJ SOLN
INTRAMUSCULAR | Status: AC
  Filled 2016-11-03: qty 2

## 2016-11-03 MED ORDER — IOPAMIDOL (ISOVUE-370) INJECTION 76%
INTRAVENOUS | Status: DC | PRN
  Administered 2016-11-03: 90 mL via INTRA_ARTERIAL

## 2016-11-03 MED ORDER — HYDRALAZINE HCL 25 MG PO TABS
25.0000 mg | ORAL_TABLET | Freq: Two times a day (BID) | ORAL | 3 refills | Status: DC
Start: 2016-11-03 — End: 2016-11-09

## 2016-11-03 MED ORDER — HEPARIN (PORCINE) IN NACL 2-0.9 UNIT/ML-% IJ SOLN
INTRAMUSCULAR | Status: DC | PRN
  Administered 2016-11-03: 1000 mL

## 2016-11-03 MED ORDER — ASPIRIN 81 MG PO CHEW: 81.0000 mg | CHEWABLE_TABLET | ORAL | Status: AC

## 2016-11-03 SURGICAL SUPPLY — 11 items
CATH BALLN WEDGE 5F 110CM (CATHETERS) ×2 IMPLANT
CATH INFINITI 5FR MULTPACK ANG (CATHETERS) ×2 IMPLANT
DEVICE RAD COMP TR BAND LRG (VASCULAR PRODUCTS) ×2 IMPLANT
GLIDESHEATH SLEND A-KIT 6F 22G (SHEATH) ×2 IMPLANT
GUIDEWIRE INQWIRE 1.5J.035X260 (WIRE) ×1 IMPLANT
INQWIRE 1.5J .035X260CM (WIRE) ×2
KIT HEART LEFT (KITS) ×2 IMPLANT
PACK CARDIAC CATHETERIZATION (CUSTOM PROCEDURE TRAY) ×2 IMPLANT
SHEATH FAST CATH BRACH 5F 5CM (SHEATH) ×2 IMPLANT
TRANSDUCER W/STOPCOCK (MISCELLANEOUS) ×2 IMPLANT
TUBING CIL FLEX 10 FLL-RA (TUBING) ×2 IMPLANT

## 2016-11-03 NOTE — Telephone Encounter (Signed)
Spoke with pt's wife, DPR on file. She states that pharmacy informed them that Bidil was going to cost the pt $250 and they are not able to afford that.  Pharmacist recommended that they contact our office to see if we could order Isosorbide and Hydralazine separately.  Advised I will send message to Dr. Tamala Julian for review and advisement.

## 2016-11-03 NOTE — H&P (View-Only) (Signed)
Cardiology Office Note    Date:  10/14/2016   ID:  [name], DOB 12/08/1934, MRN [id number]  PCP:  Mathews Argyle, MD  Cardiologist:  Dr. Tamala Julian   CC: new systolic CHF  History of Present Illness:  [name] is a 82 y.o. 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   He was last seen by Dr. Tamala Julian in 01/2015. He was doing quite well at that time. He was cleared for back surgery. He had back surgery in [id number]/5638 which was complicated by discitis requiring abx via PICC line. He was admitted to [facility] in 10/2015 for urosepsis. He was admitted in 12/2015 for myoclonic jerking.   He was recently seen by Dr. Felipa Eth, his PCP. ECHO on 09/29/16 showed EF 20-25% with severe HK of the apicalanteroseptal, inferior and apical myocardium, G1DD, mild AR, mild MR. (Not sure why echo was ordered, patient denies symptoms). He was referred back to [place] with his newly reduced EF.  Today he presents to clinic for follow up. He has had a lot of fatigue since being discharged from the hospital. Just no energy. He denies chest pain or SOB. No LE edema, orthopnea or PND. No dizziness or syncope. No palpitations. He has been feeling normal except for fatigue.    Past Medical History:  Diagnosis Date  . Arthritis   . Azotemia   . Cancer [facility]) 2010   prostate  . Chondromalacia   . Coronary artery disease   . Diabetes mellitus without complication ([place])   . GERD (gastroesophageal reflux disease)   . Glaucoma    "high normal"  . Gout   . H/O blood clots    R Lower leg, right upper leg  . Heel spur   . Hyperlipidemia   . Hypertension   . Joint pain   . Lesion of left native kidney   . Low back pain    "severe"  . Mild aortic stenosis   . Myocardial infarction    "told me I had a small heart attack"  . Obesity   . Osteopenia   .  Osteoporosis   . Polymyalgia ([place])    h/o  . Renal cyst, left   . Shingles    LEFT ABDOMEN  . Spinal stenosis   . Thrombophlebitis   . Tinnitus   . Tubular adenoma     Past Surgical History:  Procedure Laterality Date  . APPENDECTOMY  60 years ago  . CARDIAC CATHETERIZATION    . CORONARY ARTERY BYPASS GRAFT  2007  . PROSTATE SURGERY     seed implant  . ROBOTIC ASSITED PARTIAL NEPHRECTOMY Left 08/31/2013   Procedure: ROBOTIC ASSITED PARTIAL NEPHRECTOMY;  Surgeon: Dutch Gray, MD;  Location: [facility] ORS;  Service: Urology;  Laterality: Left;  Marland Kitchen VEIN LIGATION AND STRIPPING Right 1972   right lower leg    Current Medications: Outpatient Medications Prior to Visit  Medication Sig Dispense Refill  . alendronate (FOSAMAX) 70 MG tablet Take 70 mg by mouth daily. Take with a full glass of water on an empty stomach. Patient takes this medication once weekly on Saturday.    Marland Kitchen allopurinol (ZYLOPRIM) 300 MG tablet Take 300 mg by mouth daily.  2  . docusate sodium (COLACE) 100 MG capsule Take 1 capsule (100 mg total) by mouth 2 (two) times daily.  10 capsule 0  . HYDROcodone-acetaminophen (NORCO) 10-325 MG tablet Take 1-2 tablets by mouth every 6 (six) hours as needed for moderate pain. 10 tablet 0  . rosuvastatin (CRESTOR) 5 MG tablet Take 5 mg by mouth daily.  5  . tamsulosin (FLOMAX) 0.4 MG CAPS capsule Take 0.4 mg by mouth daily.  0  . acetaminophen (TYLENOL) 325 MG tablet Take 1 tablet (325 mg total) by mouth every 6 (six) hours as needed for pain (1-2 tablets q 6 hrs prn pain). (Patient taking differently: Take 650 mg by mouth every 6 (six) hours as needed (1-2 tablets q 6 hrs prn pain). ) 30 tablet 0  . insulin aspart (NOVOLOG) 100 UNIT/ML injection Inject 0-9 Units into the skin 3 (three) times daily with meals. 10 mL 11  . LACTOBACILLUS PO Take 1 tablet by mouth daily.    Marland Kitchen omeprazole (PRILOSEC) 20 MG capsule Take 20 mg by mouth daily.     No facility-administered medications prior to  visit.      Allergies:   Adacel [diphth-acell pertussis-tetanus]; Flexeril [cyclobenzaprine]; Lipitor [atorvastatin]; Pantoprazole; Penicillins; Septra [sulfamethoxazole-trimethoprim]; and Simvastatin   Social History   Social History  . Marital status: Married    Spouse name: N/A  . Number of children: N/A  . Years of education: N/A   Social History Main Topics  . Smoking status: Former Smoker    Packs/day: 3.00    Years: 30.00    Types: Cigarettes    Quit date: 12/21/1980  . Smokeless tobacco: Never Used  . Alcohol use No  . Drug use: No  . Sexual activity: Not Asked   Other Topics Concern  . None   Social History Narrative  . None     Family History:  The patient's family history includes Heart Problems in his father and mother.     ROS:   Please see the history of present illness.    ROS All other systems reviewed and are negative.   PHYSICAL EXAM:   VS:  BP 132/82   Pulse 80   Ht 5' 6.5" (1.689 m)   Wt 193 lb (87.5 kg)   BMI 30.68 kg/m    GEN: Well nourished, well developed, in no acute distress  HEENT: normal  Neck: no JVD, carotid bruits, or masses Cardiac: RRR; + murmurs, rubs, or gallops,no edema  Respiratory:  clear to auscultation bilaterally, normal work of breathing GI: soft, nontender, nondistended, + BS MS: no deformity or atrophy  Skin: warm and dry, no rash Neuro:  Alert and Oriented x 3, Strength and sensation are intact Psych: euthymic mood, full affect  Wt Readings from Last 3 Encounters:  10/14/16 193 lb (87.5 kg)  12/22/15 176 lb (79.8 kg)  11/22/15 173 lb 4 oz (78.6 kg)      Studies/Labs Reviewed:   EKG:  EKG is ordered today.  The ekg ordered today demonstrates NSR with LVH with repol changes.   Recent Labs: 10/25/2015: ALT 17 12/23/2015: BUN 7; Creatinine, Ser 1.35; Hemoglobin 9.0; Platelets 290; Potassium 3.8; Sodium 140   Lipid Panel No results found for: CHOL, TRIG, HDL, CHOLHDL, VLDL, LDLCALC, LDLDIRECT  Additional  studies/ records that were reviewed today include:  2D ECHO: 09/29/2016 LV EF: 20% -   25% Study Conclusions - Left ventricle: The cavity size was mildly dilated. Wall   thickness was increased in a pattern of mild LVH. Systolic   function was severely reduced. The estimated ejection fraction   was in the range  of 20% to 25%. Severe hypokinesis of the   apicalanteroseptal, inferior, and apical myocardium. Doppler   parameters are consistent with abnormal left ventricular   relaxation (grade 1 diastolic dysfunction). - Aortic valve: There was mild stenosis. Valve area (VTI): 1.14   cm^2. Valve area (Vmax): 1.27 cm^2. Valve area (Vmean): 0.96   cm^2. - Mitral valve: Moderately calcified annulus. There was mild   regurgitation.  Cath 2007 CONCLUSIONS:  1.  Severe coronary artery disease with chronic total occlusion of right      coronary collateralized from the left atrial recurrent in LAD as well as      an acute marginal branch of the right coronary.  2.  There is 70% stenosis in the distal left main.  There is 80% stenosis in      the proximal LAD and there is 80% stenosis in the ramus intermedius      branch.  Small circumflex is free of significant stenosis.  3.  Overall normal LV function.  PLAN:  The patient needs to have coronary bypass grafting.  He has had a  gradual development of his symptoms over the last 6 months to a year.  He is  having no acute coronary syndrome type symptoms at this time.  We will go  ahead and get him set up to see Dr. Servando Snare as an outpatient as soon as  possible on April 01, 2006.  Hopefully, surgery can be done within the next  7 days or so.  ASSESSMENT & PLAN:    New cardiomyopathy: EF now down to 20-25% which is new. I don't seen any LV assessments prior to this besides LV gram on heart cath which showed normal LV function. He has not had any chest pain and has no s/s volume overload. NHYA functional class 1B -- Will start Coreg 3.125 mg  BID. No ACE/ARB given CKD. Given his CKD with hx of RCC s/p partial nephrectomy and his lack of symptoms, I will start with a nuclear stress test to evaluate for ischemia. Plan to bring him back into the office in a couple weeks for close follow up and titration of BB.   CAD s/p CABG: will arrange nuclear stress test as above. He is on a baby ASA, will add this to his med list. Continue statin. Add BB as above.  HTN: BP well controlled on no medications. WIll add Coreg in the setting of cardiomyopathy.   HLD: continue Crestor  CKD/RCC s/p partial nephrectomy: last creat was ~1.3 in 12/2015. Labs just done at Dr. Carlyle Lipa office, will have these faxed to [place].    Medication Adjustments/Labs and Tests Ordered: Current medicines are reviewed at length with the patient today.  Concerns regarding medicines are outlined above.  Medication changes, Labs and Tests ordered today are listed in the Patient Instructions below. Patient Instructions  Medication Instructions:  Your physician has recommended you make the following change in your medication:  1.  START Coreg 3.125 taking 1 tablet twice a day   Labwork: None ordered  Testing/Procedures: Your physician has requested that you have a lexiscan myoview. For further information please visit [email or web address]. Please follow instruction sheet, as given.   Follow-Up: Your physician recommends that you schedule a follow-up appointment in: 1-2 WEEKS AFTER THE LEXISCAN TO SEE KATIE THOMPSON, PA-C   Any Other Special Instructions Will Be Listed Below (If Applicable). Pharmacologic Stress Electrocardiogram A pharmacologic stress electrocardiogram is a heart (cardiac) test that uses nuclear imaging to evaluate the  blood supply to your heart. This test may also be called a pharmacologic stress electrocardiography. Pharmacologic means that a medicine is used to increase your heart rate and blood pressure.  This stress test is done to find areas of  poor blood flow to the heart by determining the extent of coronary artery disease (CAD). Some people exercise on a treadmill, which naturally increases the blood flow to the heart. For those people unable to exercise on a treadmill, a medicine is used. This medicine stimulates your heart and will cause your heart to beat harder and more quickly, as if you were exercising.  Pharmacologic stress tests can help determine:  The adequacy of blood flow to your heart during increased levels of activity in order to clear you for discharge home.  The extent of coronary artery blockage caused by CAD.  Your prognosis if you have suffered a heart attack.  The effectiveness of cardiac procedures done, such as an angioplasty, which can increase the circulation in your coronary arteries.  Causes of chest pain or pressure. LET [facility] CARE PROVIDER KNOW ABOUT:  Any allergies you have.  All medicines you are taking, including vitamins, herbs, eye drops, creams, and over-the-counter medicines.  Previous problems you or members of your family have had with the use of anesthetics.  Any blood disorders you have.  Previous surgeries you have had.  Medical conditions you have.  Possibility of pregnancy, if this applies.  If you are currently breastfeeding. RISKS AND COMPLICATIONS Generally, this is a safe procedure. However, as with any procedure, complications can occur. Possible complications include:  You develop pain or pressure in the following areas:  Chest.  Jaw or neck.  Between your shoulder blades.  Radiating down your left arm.  Headache.  Dizziness or light-headedness.  Shortness of breath.  Increased or irregular heartbeat.  Low blood pressure.  Nausea or vomiting.  Flushing.  Redness going up the arm and slight pain during injection of medicine.  Heart attack (rare). BEFORE THE PROCEDURE   Avoid all forms of caffeine for 24 hours before your test or as directed  by your health care provider. This includes coffee, tea (even decaffeinated tea), caffeinated sodas, chocolate, cocoa, and certain pain medicines.  Follow your health care provider's instructions regarding eating and drinking before the test.  Take your medicines as directed at regular times with water unless instructed otherwise. Exceptions may include:  If you have diabetes, ask how you are to take your insulin or pills. It is common to adjust insulin dosing the morning of the test.  If you are taking beta-blocker medicines, it is important to talk to your health care provider about these medicines well before the date of your test. Taking beta-blocker medicines may interfere with the test. In some cases, these medicines need to be changed or stopped 24 hours or more before the test.  If you wear a nitroglycerin patch, it may need to be removed prior to the test. Ask your health care provider if the patch should be removed before the test.  If you use an inhaler for any breathing condition, bring it with you to the test.  If you are an outpatient, bring a snack so you can eat right after the stress phase of the test.  Do not smoke for 4 hours prior to the test or as directed by your health care provider.  Do not apply lotions, powders, creams, or oils on your chest prior to the test.  Wear  comfortable shoes and clothing. Let your health care provider know if you were unable to complete or follow the preparations for your test. PROCEDURE   Multiple patches (electrodes) will be put on your chest. If needed, small areas of your chest may be shaved to get better contact with the electrodes. Once the electrodes are attached to your body, multiple wires will be attached to the electrodes, and your heart rate will be monitored.  An IV access will be started. A nuclear trace (isotope) is given. The isotope may be given intravenously, or it may be swallowed. Nuclear refers to several types of  radioactive isotopes, and the nuclear isotope lights up the arteries so that the nuclear images are clear. The isotope is absorbed by your body. This results in low radiation exposure.  A resting nuclear image is taken to show how your heart functions at rest.  A medicine is given through the IV access.  A second scan is done about 1 hour after the medicine injection and determines how your heart functions under stress.  During this stress phase, you will be connected to an electrocardiogram machine. Your blood pressure and oxygen levels will be monitored. AFTER THE PROCEDURE   Your heart rate and blood pressure will be monitored after the test.  You may return to your normal schedule, including diet,activities, and medicines, unless your health care provider tells you otherwise.   This information is not intended to replace advice given to you by your health care provider. Make sure you discuss any questions you have with your health care provider.   Document Released: 04/25/2009 Document Revised: 12/12/2013 Document Reviewed: 08/14/2013 Elsevier Interactive Patient Education [employer].     If you need a refill on your cardiac medications before your next appointment, please call your pharmacy.      Signed, Angelena Form, PA-C  10/14/2016 9:12 AM    [facility] [address], [place], [place]  [address] Phone: [phone]; Fax: [phone]

## 2016-11-03 NOTE — Discharge Instructions (Signed)

## 2016-11-03 NOTE — Telephone Encounter (Signed)
Spoke with wife and advised her of new orders per Dr. Tamala Julian.  Sent prescriptions to preferred pharmacy. Wife verbalized understanding and was appreciative or assistance.

## 2016-11-03 NOTE — Telephone Encounter (Signed)
New Message:   Please call,concerning the medicine Dr Tamala Julian prescribed. It is too expensive,unless you buy two generics.

## 2016-11-03 NOTE — Telephone Encounter (Signed)
Okay. Try Imdur generic 30 mg daily and hydralazine 25 mg BID

## 2016-11-03 NOTE — Interval H&P Note (Signed)
Cath Lab Visit (complete for each Cath Lab visit)  Clinical Evaluation Leading to the Procedure:   ACS: No.  Non-ACS:    Anginal Classification: CCS Lyons  Anti-ischemic medical therapy: Minimal Therapy (1 class of medications)  Non-Invasive Test Results: High-risk stress test findings: cardiac mortality >3%/year  Prior CABG: Previous CABG      History and Physical Interval Note:  11/03/2016 7:31 AM  [name]  has presented today for surgery, with the diagnosis of abnormal mioview  The various methods of treatment have been discussed with the patient and family. After consideration of risks, benefits and other options for treatment, the patient has consented to  Procedure(s): Right/Left Heart Cath and Coronary/Graft Angiography (N/A) as a surgical intervention .  The patient's history has been reviewed, patient examined, no change in status, stable for surgery.  I have reviewed the patient's chart and labs.  Questions were answered to the patient's satisfaction.     [name]

## 2016-11-04 LAB — POCT I-STAT 3, VENOUS BLOOD GAS (G3P V)
BICARBONATE: 25.7 mmol/L (ref 20.0–28.0)
O2 Saturation: 97 %
PCO2 VEN: 47 mmHg (ref 44.0–60.0)
PH VEN: 7.345 (ref 7.250–7.430)
PO2 VEN: 99 mmHg — AB (ref 32.0–45.0)
TCO2: 27 mmol/L (ref 0–100)

## 2016-11-09 ENCOUNTER — Telehealth: Payer: Self-pay | Admitting: Interventional Cardiology

## 2016-11-09 NOTE — Telephone Encounter (Signed)
Pt has been having swelling in his right hand and bilateral feet.  Also has some SOB and slight CP when exerting.  Denies dizziness and lightheadedness.  Vitals are 80-108/50-60, HR 70-80.  Pt states that he just generally has not been feeling well since starting new medications (Coreg, Imdur and Hydralazine).  Pt has not taken Coreg since yesterday morning and did not take Imdur this morning.  States he is feeling much better today.  Advised I would speak with Dr. Tamala Julian and call pt back with recommendations.  Spoke with Dr. Tamala Julian and he said to have pt d/c Hydralazine and update Bonney Leitz, PA-C at his follow up appt.  Spoke with pt and informed him of recommendations.  Pt verbalized understanding.

## 2016-11-09 NOTE — Telephone Encounter (Signed)
[name] is calling in reference to [name] medication . Steven Form PA prescribe medication ( Carvedilol) and new medication in which Dr. Tamala Julian prescribe for him and he has been having some bad side effects and he is wanting to know what should he do. He has stopped taking the Carvedilol . Please call

## 2016-11-16 ENCOUNTER — Telehealth: Payer: Self-pay | Admitting: Interventional Cardiology

## 2016-11-16 NOTE — Telephone Encounter (Signed)
Spoke with pt and he states that he stopped Hydralazine as instructed on 11/20 and had no improvement with his swelling.  Pt then stopped Isosorbide on his own 3 days ago and swelling and SOB has resolved.  Pt has been monitoring vitals and they have been 120-125/63-65, HR 70's.  Denies CP, SOB or other cardiac issues and states that he actually feels the best now that he has since leaving hospital.  Pt states he did his exercises this morning with no problems.  Pt is still taking Coreg.  Advised I will send information to Dr. Tamala Julian for review and will call if he has any new recommendations.  Pt verbalized understanding.

## 2016-11-16 NOTE — Telephone Encounter (Signed)
Spoke with pt and rescheduled appt. Pt verbalized understanding and was in agreement with this plan.

## 2016-11-16 NOTE — Telephone Encounter (Signed)
He needs a follow-up appointment with me over the next 2-4 weeks. This will help [place] structure a treatment plan for systolic heart failure.

## 2016-11-24 ENCOUNTER — Ambulatory Visit: Payer: Medicare Other | Admitting: Physician Assistant

## 2016-12-08 ENCOUNTER — Encounter: Payer: Self-pay | Admitting: Interventional Cardiology

## 2016-12-08 ENCOUNTER — Ambulatory Visit (INDEPENDENT_AMBULATORY_CARE_PROVIDER_SITE_OTHER): Payer: Medicare Other | Admitting: Interventional Cardiology

## 2016-12-08 VITALS — BP 126/78 | HR 82 | Ht 66.5 in | Wt 198.0 lb

## 2016-12-08 DIAGNOSIS — I10 Essential (primary) hypertension: Secondary | ICD-10-CM

## 2016-12-08 DIAGNOSIS — I5021 Acute systolic (congestive) heart failure: Secondary | ICD-10-CM

## 2016-12-08 DIAGNOSIS — I420 Dilated cardiomyopathy: Secondary | ICD-10-CM | POA: Diagnosis not present

## 2016-12-08 DIAGNOSIS — R000 Tachycardia, unspecified: Secondary | ICD-10-CM

## 2016-12-08 DIAGNOSIS — I25810 Atherosclerosis of coronary artery bypass graft(s) without angina pectoris: Secondary | ICD-10-CM

## 2016-12-08 LAB — BASIC METABOLIC PANEL
BUN: 33 mg/dL — AB (ref 7–25)
CHLORIDE: 105 mmol/L (ref 98–110)
CO2: 28 mmol/L (ref 20–31)
Calcium: 9.9 mg/dL (ref 8.6–10.3)
Creat: 1.91 mg/dL — ABNORMAL HIGH (ref 0.70–1.11)
Glucose, Bld: 185 mg/dL — ABNORMAL HIGH (ref 65–99)
POTASSIUM: 4.8 mmol/L (ref 3.5–5.3)
SODIUM: 142 mmol/L (ref 135–146)

## 2016-12-08 MED ORDER — SPIRONOLACTONE 25 MG PO TABS: 25.0000 mg | ORAL_TABLET | Freq: Two times a day (BID) | ORAL | 11 refills | Status: DC

## 2016-12-08 NOTE — Patient Instructions (Signed)
Medication Instructions:  1) START Spironolactone 25mg  twice daily.  Labwork: BMET today  Your physician recommends that you return for lab work in: 1 week (BMET)   Testing/Procedures: None  Follow-Up: Your physician recommends that you schedule a follow-up appointment in: 2 weeks with a PA or NP.    Any Other Special Instructions Will Be Listed Below (If Applicable).     If you need a refill on your cardiac medications before your next appointment, please call your pharmacy.

## 2016-12-08 NOTE — Progress Notes (Signed)
Cardiology Office Note    Date:  12/08/2016   ID:  [name], DOB 21-Jan-1934, MRN [id number]  PCP:  Mathews Argyle, MD  Cardiologist: Sinclair Grooms, MD   Chief Complaint  Patient presents with  . Congestive Heart Failure    History of Present Illness:  [name] is a 82 y.o. male for CAD, prior coronary bypass grafting with graft failure, hypertension, new systolic heart failure with reduced EF of 25% out of proportion to degree of coronary disease and hypertension.  She has been unable to tolerate recent attempts to establish a heart failure regimen. Hydralazine, long-acting nitrates, and carvedilol have been discontinued by the patient because of the areas side effects that include low blood pressure for the first 2 and lower extremity swelling with the beta blocker. He is currently on no heart failure therapy. Denies angina. He has not had angina despite closure of the saphenous vein graft to the circumflex.     Past Medical History:  Diagnosis Date  . Arthritis   . Azotemia   . Cancer [facility]) 2010   prostate  . Chondromalacia   . Coronary artery disease   . Diabetes mellitus without complication ([place])   . GERD (gastroesophageal reflux disease)   . Glaucoma    "high normal"  . Gout   . H/O blood clots    R Lower leg, right upper leg  . Heel spur   . Hyperlipidemia   . Hypertension   . Joint pain   . Lesion of left native kidney   . Low back pain    "severe"  . Mild aortic stenosis   . Myocardial infarction    "told me I had a small heart attack"  . Obesity   . Osteopenia   . Osteoporosis   . Polymyalgia ([place])    h/o  . Renal cyst, left   . Shingles    LEFT ABDOMEN  . Spinal stenosis   . Thrombophlebitis   . Tinnitus   . Tubular adenoma     Past Surgical History:  Procedure Laterality Date  . APPENDECTOMY  60 years ago  . CARDIAC CATHETERIZATION    . CARDIAC CATHETERIZATION N/A 11/03/2016   Procedure: Right/Left Heart Cath and  Coronary/Graft Angiography;  Surgeon: Belva Crome, MD;  Location: [facility];  Service: Cardiovascular;  Laterality: N/A;  . CORONARY ARTERY BYPASS GRAFT  2007  . PROSTATE SURGERY     seed implant  . ROBOTIC ASSITED PARTIAL NEPHRECTOMY Left 08/31/2013   Procedure: ROBOTIC ASSITED PARTIAL NEPHRECTOMY;  Surgeon: Dutch Gray, MD;  Location: [facility];  Service: Urology;  Laterality: Left;  Marland Kitchen VEIN LIGATION AND STRIPPING Right 1972   right lower leg    Current Medications: Outpatient Medications Prior to Visit  Medication Sig Dispense Refill  . alendronate (FOSAMAX) 70 MG tablet Take 70 mg by mouth daily. Take with a full glass of water on an empty stomach. Patient takes this medication once weekly on Saturday.    Marland Kitchen allopurinol (ZYLOPRIM) 300 MG tablet Take 300 mg by mouth daily.  2  . aspirin 81 MG chewable tablet Chew 81 mg by mouth daily.    . Calcium Carbonate-Vitamin D 600-200 MG-UNIT TABS Take 1 tablet by mouth daily.    . Cholecalciferol (VITAMIN D) 2000 units CAPS Take 1 capsule by mouth daily.    Marland Kitchen glipiZIDE (GLUCOTROL) 10 MG tablet Take 10 mg by mouth 2 (two) times daily before a meal.  2  . HYDROcodone-acetaminophen (NORCO) 10-325 MG tablet Take 1-2 tablets by mouth every 6 (six) hours as needed for moderate pain. 10 tablet 0  . indomethacin (INDOCIN) 50 MG capsule Take 50 mg by mouth as needed for mild pain or moderate pain.    . Multiple Vitamin (MULTIVITAMIN) tablet Take 1 tablet by mouth daily.    . predniSONE (DELTASONE) 10 MG tablet Take 10 mg by mouth daily with breakfast.    . carvedilol (COREG) 3.125 MG tablet Take 1 tablet (3.125 mg total) by mouth 2 (two) times daily with a meal. (Patient not taking: Reported on 12/08/2016) 180 tablet 1  . isosorbide mononitrate (IMDUR) 30 MG 24 hr tablet Take 1 tablet (30 mg total) by mouth daily. (Patient not taking: Reported on 12/08/2016) 90 tablet 3  . rosuvastatin (CRESTOR) 5 MG tablet Take 5 mg by mouth daily.  5   No  facility-administered medications prior to visit.      Allergies:   Adacel [diphth-acell pertussis-tetanus]; Flexeril [cyclobenzaprine]; Lipitor [atorvastatin]; Pantoprazole; Penicillins; Septra [sulfamethoxazole-trimethoprim]; and Simvastatin   Social History   Social History  . Marital status: Married    Spouse name: N/A  . Number of children: N/A  . Years of education: N/A   Social History Main Topics  . Smoking status: Former Smoker    Packs/day: 3.00    Years: 30.00    Types: Cigarettes    Quit date: 12/21/1980  . Smokeless tobacco: Never Used  . Alcohol use No  . Drug use: No  . Sexual activity: Not Asked   Other Topics Concern  . None   Social History Narrative  . None     Family History:  The patient's family history includes Heart Problems in his father and mother.   ROS:   Please see the history of present illness.    Since the fatigue, difficulty with balance, back pain, easy bruising, and excessive fatigue.  All other systems reviewed and are negative.   PHYSICAL EXAM:   VS:  BP 126/78   Pulse 82   Ht 5' 6.5" (1.689 m)   Wt 198 lb (89.8 kg)   BMI 31.48 kg/m    GEN: Well nourished, well developed, in no acute distress . Marked abdominal obesity. HEENT: normal  Neck: Moderate JVD, carotid bruits, or masses Cardiac: RRR; 2/6 systolic murmur, rubs, or gallops,no edema  Respiratory:  clear to auscultation bilaterally, normal work of breathing GI: soft, nontender, nondistended, + BS MS: no deformity or atrophy  Skin: warm and dry, no rash Neuro:  Alert and Oriented x 3, Strength and sensation are intact Psych: euthymic mood, full affect  Wt Readings from Last 3 Encounters:  12/08/16 198 lb (89.8 kg)  11/03/16 185 lb (83.9 kg)  10/22/16 193 lb (87.5 kg)      Studies/Labs Reviewed:   EKG:  EKG  EKG is not performed  Recent Labs: 10/29/2016: BUN 23; Creat 1.77; Hemoglobin 12.4; Platelets 221; Potassium 4.4; Sodium 140   Lipid Panel No results  found for: CHOL, TRIG, HDL, CHOLHDL, VLDL, LDLCALC, LDLDIRECT  Additional studies/ records that were reviewed today include:   Coronary angiography 11/04/16: Diagnostic Diagram       Echocardiogram 09/29/16: Study Conclusions  - Left ventricle: The cavity size was mildly dilated. Wall   thickness was increased in a pattern of mild LVH. Systolic   function was severely reduced. The estimated ejection fraction   was in the range of 20% to 25%. Severe hypokinesis of the  apicalanteroseptal, inferior, and apical myocardium. Doppler   parameters are consistent with abnormal left ventricular   relaxation (grade 1 diastolic dysfunction). - Aortic valve: There was mild stenosis. Valve area (VTI): 1.14   cm^2. Valve area (Vmax): 1.27 cm^2. Valve area (Vmean): 0.96   cm^2. - Mitral valve: Moderately calcified annulus. There was mild   regurgitation.   ASSESSMENT:    1. Coronary artery disease involving coronary bypass graft of native heart without angina pectoris   2. Acute systolic heart failure ([place])   3. Dilated cardiomyopathy ([place])   4. Tachycardia   5. Essential hypertension      PLAN:  In order of problems listed above:  1. Has bypass graft failure with 1 graft down and other grafts are open. LV dysfunction is out of proportion to the degree of coronary disease. 2. Acute reduction in LV systolic function seems out of proportion to the degree of coronary disease. He is symptomatic with exertional dyspnea and has a 13 pound weight gain over the past 2-3 months. We will check a basic metabolic panel today and if appropriate, start spironolactone 25 mg by mouth twice a day. He'll return in 2 weeks at which time we should resume low-dose beta blocker therapy. Hopefully with the protection of diuretic therapy, the lower extremity edema that he experienced previously will not recur. Hopefully interest token be started if kidney function remained stable. We need to be on a stable beta  blocker dose initially. 3. 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 4. Not addressed as there is no issue currently. 5. Relatively low blood pressure has been an issue rather than hypertension. Blood pressure is stable today.    Medication Adjustments/Labs and Tests Ordered: Current medicines are reviewed at length with the patient today.  Concerns regarding medicines are outlined above.  Medication changes, Labs and Tests ordered today are listed in the Patient Instructions below. Patient Instructions  Medication Instructions:  1) START Spironolactone 25mg  twice daily.  Labwork: BMET today  Your physician recommends that you return for lab work in: 1 week (BMET)   Testing/Procedures: None  Follow-Up: Your physician recommends that you schedule a follow-up appointment in: 2 weeks with a PA or NP.    Any Other Special Instructions Will Be Listed Below (If Applicable).     If you need a refill on your cardiac medications before your next appointment, please call your pharmacy.      Signed, Sinclair Grooms, MD  12/08/2016 8:40 AM    [facility] [place], [place], [place]  [address] Phone: [phone]; Fax: [phone]

## 2016-12-09 ENCOUNTER — Other Ambulatory Visit: Payer: Self-pay | Admitting: Neurosurgery

## 2016-12-09 DIAGNOSIS — M47816 Spondylosis without myelopathy or radiculopathy, lumbar region: Secondary | ICD-10-CM

## 2016-12-10 ENCOUNTER — Telehealth: Payer: Self-pay | Admitting: Interventional Cardiology

## 2016-12-10 MED ORDER — SPIRONOLACTONE 25 MG PO TABS: 25.0000 mg | ORAL_TABLET | Freq: Every day | ORAL | 11 refills | Status: DC

## 2016-12-10 NOTE — Telephone Encounter (Signed)
Pt wife is calling to see if it okay for her husband to take a muscle relaxer lethocarbamol

## 2016-12-10 NOTE — Telephone Encounter (Signed)
Will route to Dr. Smith for review and advisement.  

## 2016-12-12 NOTE — Telephone Encounter (Signed)
Okay with me 

## 2016-12-16 NOTE — Telephone Encounter (Signed)
Left message of okay to take

## 2016-12-18 ENCOUNTER — Other Ambulatory Visit: Payer: Medicare Other | Admitting: *Deleted

## 2016-12-18 ENCOUNTER — Ambulatory Visit
Admission: RE | Admit: 2016-12-18 | Discharge: 2016-12-18 | Disposition: A | Discharge status: Home / Self Care | Payer: Medicare Other | Source: Ambulatory Visit | Attending: Neurosurgery | Admitting: Neurosurgery

## 2016-12-18 DIAGNOSIS — M47816 Spondylosis without myelopathy or radiculopathy, lumbar region: Secondary | ICD-10-CM

## 2016-12-18 DIAGNOSIS — I5021 Acute systolic (congestive) heart failure: Secondary | ICD-10-CM

## 2016-12-18 LAB — BASIC METABOLIC PANEL
BUN: 33 mg/dL — AB (ref 7–25)
CHLORIDE: 100 mmol/L (ref 98–110)
CO2: 25 mmol/L (ref 20–31)
Calcium: 10.1 mg/dL (ref 8.6–10.3)
Creat: 1.83 mg/dL — ABNORMAL HIGH (ref 0.70–1.11)
GLUCOSE: 246 mg/dL — AB (ref 65–99)
POTASSIUM: 4.3 mmol/L (ref 3.5–5.3)
Sodium: 138 mmol/L (ref 135–146)

## 2016-12-18 MED ORDER — GADOBENATE DIMEGLUMINE 529 MG/ML IV SOLN
8.0000 mL | Freq: Once | INTRAVENOUS | Status: AC | PRN
  Administered 2016-12-18: 8 mL via INTRAVENOUS

## 2016-12-20 NOTE — Progress Notes (Signed)
Cardiology Office Note    Date:  12/23/2016   ID:  [name], DOB 1934/05/09, MRN [id number]  PCP:  Mathews Argyle, MD  Cardiologist:  Dr. Tamala Julian   CC: systolic CHF  History of Present Illness:  [name] is a 82 y.o. 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   I saw him in clinic for newly diagnosed systolic CHF (EF 97-67%). I ordered a nuclear stress test which returned high risk due to severely depressed LV function but no definite ischemia. He subsequently underwent L/RHC which showed closure of his SVG to LCx. His cardiomyopathy was felt to be out of proportion to his CAD. He saw Dr. Tamala Julian back in the office on 12/08/16 for follow up. He was unable to tolerate heart failure regimen including hydralazine/nitrates and coreg given hypotension and LE edema respectively. D.r Tamala Julian started spiro 25mg  daily. Follow up BMET was stable. Plan was to restart Coreg 3.25mg  BID at follow up.   Today he presents to clinic for follow up. He has had some diarrhea but that has cleared up. He is feeling a lot better on the spironolactone. He lost weight on this and breathing has improved greatly. He has gained weight since 10/2016 which he attributes to prednisone use for LBP. He is seriously dieting now because he has felt so much better on the spiro. LE edema has also improved on spirolactone. No orthopnea or PND. No dizziness or syncope. No blood in stool or urine. No chest pain. No palpitations.      Past Medical History:  Diagnosis Date  . Arthritis   . Azotemia   . Cancer [facility] 2010   prostate  . Chondromalacia   . Coronary artery disease   . Diabetes mellitus without complication ([place])   . GERD (gastroesophageal reflux disease)   . Glaucoma    "high normal"  . Gout   . H/O blood clots    R Lower leg, right upper leg    . Heel spur   . Hyperlipidemia   . Hypertension   . Joint pain   . Lesion of left native kidney   . Low back pain    "severe"  . Mild aortic stenosis   . Myocardial infarction    "told me I had a small heart attack"  . Obesity   . Osteopenia   . Osteoporosis   . Polymyalgia ([place])    h/o  . Renal cyst, left   . Shingles    LEFT ABDOMEN  . Spinal stenosis   . Thrombophlebitis   . Tinnitus   . Tubular adenoma     Past Surgical History:  Procedure Laterality Date  . APPENDECTOMY  60 years ago  . CARDIAC CATHETERIZATION    . CARDIAC CATHETERIZATION N/A 11/03/2016   Procedure: Right/Left Heart Cath and Coronary/Graft Angiography;  Surgeon: Belva Crome, MD;  Location: [facility];  Service: Cardiovascular;  Laterality: N/A;  . CORONARY ARTERY BYPASS GRAFT  2007  . PROSTATE SURGERY     seed implant  . ROBOTIC ASSITED PARTIAL NEPHRECTOMY Left 08/31/2013   Procedure: ROBOTIC ASSITED PARTIAL NEPHRECTOMY;  Surgeon: Dutch Gray, MD;  Location: [facility];  Service: Urology;  Laterality: Left;  Marland Kitchen VEIN LIGATION AND STRIPPING Right 1972   right lower leg    Current Medications: Outpatient Medications  Prior to Visit  Medication Sig Dispense Refill  . alendronate (FOSAMAX) 70 MG tablet Take 70 mg by mouth daily. Take with a full glass of water on an empty stomach. Patient takes this medication once weekly on Saturday.    Marland Kitchen allopurinol (ZYLOPRIM) 300 MG tablet Take 300 mg by mouth daily.  2  . aspirin 81 MG chewable tablet Chew 81 mg by mouth daily.    . Calcium Carbonate-Vitamin D 600-200 MG-UNIT TABS Take 1 tablet by mouth daily.    . Cholecalciferol (VITAMIN D) 2000 units CAPS Take 1 capsule by mouth daily.    Marland Kitchen glipiZIDE (GLUCOTROL) 10 MG tablet Take 10 mg by mouth 2 (two) times daily before a meal.   2  . HYDROcodone-acetaminophen (NORCO) 10-325 MG tablet Take 1-2 tablets by mouth every 6 (six) hours as needed for moderate pain. 10 tablet 0  . indomethacin (INDOCIN) 50 MG  capsule Take 50 mg by mouth as needed for mild pain or moderate pain.    . Multiple Vitamin (MULTIVITAMIN) tablet Take 1 tablet by mouth daily.    . predniSONE (DELTASONE) 10 MG tablet Take 10 mg by mouth daily with breakfast.    . rosuvastatin (CRESTOR) 10 MG tablet Take 10 mg by mouth daily.    Marland Kitchen spironolactone (ALDACTONE) 25 MG tablet Take 1 tablet (25 mg total) by mouth daily. 30 tablet 11   No facility-administered medications prior to visit.      Allergies:   Adacel [diphth-acell pertussis-tetanus]; Flexeril [cyclobenzaprine]; Lipitor [atorvastatin]; Pantoprazole; Penicillins; Septra [sulfamethoxazole-trimethoprim]; and Simvastatin   Social History   Social History  . Marital status: Married    Spouse name: N/A  . Number of children: N/A  . Years of education: N/A   Social History Main Topics  . Smoking status: Former Smoker    Packs/day: 3.00    Years: 30.00    Types: Cigarettes    Quit date: 12/21/1980  . Smokeless tobacco: Never Used  . Alcohol use No  . Drug use: No  . Sexual activity: Not Asked   Other Topics Concern  . None   Social History Narrative  . None     Family History:  The patient's family history includes Heart Problems in his father and mother.     ROS:   Please see the history of present illness.    ROS All other systems reviewed and are negative.   PHYSICAL EXAM:   VS:  BP 130/70   Pulse 84   Ht 5' 6.5" (1.689 m)   Wt 194 lb 1.9 oz (88.1 kg)   BMI 30.86 kg/m    GEN: Well nourished, well developed, in no acute distress  HEENT: normal  Neck: no JVD, carotid bruits, or masses Cardiac: RRR; + murmur, rubs, or gallops,no edema  Respiratory:  clear to auscultation bilaterally, normal work of breathing GI: soft, nontender, nondistended, + BS MS: no deformity or atrophy  Skin: warm and dry, no rash Neuro:  Alert and Oriented x 3, Strength and sensation are intact Psych: euthymic mood, full affect  Wt Readings from Last 3 Encounters:    12/23/16 194 lb 1.9 oz (88.1 kg)  12/08/16 198 lb (89.8 kg)  11/03/16 185 lb (83.9 kg)      Studies/Labs Reviewed:   EKG:  EKG is not ordered today.    Recent Labs: 10/29/2016: Hemoglobin 12.4; Platelets 221 12/18/2016: BUN 33; Creat 1.83; Potassium 4.3; Sodium 138   Lipid Panel No results found for: CHOL, TRIG, HDL,  CHOLHDL, VLDL, LDLCALC, LDLDIRECT  Additional studies/ records that were reviewed today include:  2D ECHO: 09/29/2016 LV EF: 20% -   25% Study Conclusions - Left ventricle: The cavity size was mildly dilated. Wall   thickness was increased in a pattern of mild LVH. Systolic   function was severely reduced. The estimated ejection fraction   was in the range of 20% to 25%. Severe hypokinesis of the   apicalanteroseptal, inferior, and apical myocardium. Doppler   parameters are consistent with abnormal left ventricular   relaxation (grade 1 diastolic dysfunction). - Aortic valve: There was mild stenosis. Valve area (VTI): 1.14   cm^2. Valve area (Vmax): 1.27 cm^2. Valve area (Vmean): 0.96   cm^2. - Mitral valve: Moderately calcified annulus. There was mild   regurgitation.   Nuclear stress test 10/22/16 Study Highlights    Nuclear stress EF: 23%.  There was no ST segment deviation noted during stress.  Defect 1: There is a small defect of mild severity present in the basal inferior location.  This is a high risk study.   High risk study due to severely depressed left ventricular systolic function. No reversible ischemia is seen.  There is a small fixed inferior defect that is likely due to diaphragmatic attenuation, although cannot exclude a small infarction.. Study suggestive of nonischemic cardiomyopathy, but cannot exclude multivessel CAD with "balanced ischemia".     11/03/16 Right/Left Heart Cath and Coronary/Graft Angiography  Conclusion   Severe native vessel coronary disease with total occlusion of the left main and native right  coronary.  Bypass graft failure with occlusion of the saphenous vein graft to the ramus intermedius.  Widely patent saphenous vein graft to the PDA and PL branch.  Widely patent LIMA to the LAD  Normal pulmonary artery pressures including capillary wedge.      ASSESSMENT & PLAN:   Chronic systolic CHF: felt to be a mixed ICM/NCM but LV dysfunction felt to be out of proportion to degree of CAD. Continue spiro 25mg  daily. Will add Coreg 3.25mg  BID. He will call us back if he gets any swelling. No ACE/ARB 2/2 CKD. He did not tolerate hydralazine/nitrates due to hypotension. Will repeat echo at follow up to see if there has been improvement of LV function.   CAD s/p CABG: cath on 11/03/16 with failure of SVG--> RI. Continue asa and statin. No chest pain.   HTN: more recently has had hypotension. We are trying to add medications to treat his CHF. Tolerating spiro 25mg  daily. BP 130/70. Will add back Coreg 3.25mg  BID and see how he does  HLD: continue Crestor  CKD/RCC s/p partial nephrectomy: creat 1.83 on 12/18/16.   DMT2: continue Glipizide. HgA1c ~ 6.9 last. He is working hard on diet and exercise.   Medication Adjustments/Labs and Tests Ordered: Current medicines are reviewed at length with the patient today.  Concerns regarding medicines are outlined above.  Medication changes, Labs and Tests ordered today are listed in the Patient Instructions below. Patient Instructions  Medication Instructions:  Start taking Coreg 3.125 mg twice daily. All other medications remain the same.  Labwork: None  Testing/Procedures: None  Follow-Up: Your physician wants you to follow-up in: 3 months. You will receive a reminder letter in the mail two months in advance. If you don't receive a letter, please call our office to schedule the follow-up appointment.      If you need a refill on your cardiac medications before your next appointment, please call your pharmacy.  Signed, Angelena Form, PA-C  12/23/2016 1:14 PM    [facility] [place], [place], [place]  [address] Phone: [phone]; Fax: [phone]

## 2016-12-23 ENCOUNTER — Ambulatory Visit (INDEPENDENT_AMBULATORY_CARE_PROVIDER_SITE_OTHER): Payer: PRIVATE HEALTH INSURANCE | Admitting: Physician Assistant

## 2016-12-23 ENCOUNTER — Encounter: Payer: Self-pay | Admitting: Physician Assistant

## 2016-12-23 VITALS — BP 130/70 | HR 84 | Ht 66.5 in | Wt 194.1 lb

## 2016-12-23 DIAGNOSIS — I25810 Atherosclerosis of coronary artery bypass graft(s) without angina pectoris: Secondary | ICD-10-CM | POA: Diagnosis not present

## 2016-12-23 DIAGNOSIS — E785 Hyperlipidemia, unspecified: Secondary | ICD-10-CM | POA: Diagnosis not present

## 2016-12-23 DIAGNOSIS — I5022 Chronic systolic (congestive) heart failure: Secondary | ICD-10-CM | POA: Diagnosis not present

## 2016-12-23 DIAGNOSIS — I10 Essential (primary) hypertension: Secondary | ICD-10-CM

## 2016-12-23 DIAGNOSIS — C642 Malignant neoplasm of left kidney, except renal pelvis: Secondary | ICD-10-CM

## 2016-12-23 DIAGNOSIS — N183 Chronic kidney disease, stage 3 unspecified: Secondary | ICD-10-CM

## 2016-12-23 DIAGNOSIS — E119 Type 2 diabetes mellitus without complications: Secondary | ICD-10-CM

## 2016-12-23 MED ORDER — CARVEDILOL 3.125 MG PO TABS: 3.1250 mg | ORAL_TABLET | Freq: Two times a day (BID) | ORAL | 3 refills | Status: DC

## 2016-12-23 NOTE — Patient Instructions (Addendum)
Medication Instructions:  Start taking Coreg 3.125 mg twice daily. All other medications remain the same.  Labwork: None  Testing/Procedures: None  Follow-Up: Your physician wants you to follow-up in: 3 months. You will receive a reminder letter in the mail two months in advance. If you don't receive a letter, please call our office to schedule the follow-up appointment.      If you need a refill on your cardiac medications before your next appointment, please call your pharmacy.

## 2016-12-30 ENCOUNTER — Ambulatory Visit: Payer: Medicare Other | Admitting: Cardiovascular Disease

## 2016-12-31 DIAGNOSIS — M47816 Spondylosis without myelopathy or radiculopathy, lumbar region: Secondary | ICD-10-CM | POA: Diagnosis not present

## 2017-01-11 ENCOUNTER — Ambulatory Visit: Payer: Medicare Other | Admitting: Interventional Cardiology

## 2017-01-15 ENCOUNTER — Telehealth: Payer: Self-pay | Admitting: Physician Assistant

## 2017-01-15 NOTE — Telephone Encounter (Signed)
New message  Pt wife call requesting to speak with RN about up coming appt. Please call back to discuss

## 2017-01-15 NOTE — Telephone Encounter (Signed)
Returned Mrs. [place], [place] on file, call.  She has been advised for pt to keep his f/u appt with Nell Range, PA-C to follow-up on pt slow heart rate  She verbalized understanding and appreciation for the call.

## 2017-01-19 DIAGNOSIS — M461 Sacroiliitis, not elsewhere classified: Secondary | ICD-10-CM | POA: Diagnosis not present

## 2017-01-19 DIAGNOSIS — R030 Elevated blood-pressure reading, without diagnosis of hypertension: Secondary | ICD-10-CM | POA: Diagnosis not present

## 2017-01-21 DIAGNOSIS — Z85820 Personal history of malignant melanoma of skin: Secondary | ICD-10-CM | POA: Diagnosis not present

## 2017-01-21 DIAGNOSIS — Z85828 Personal history of other malignant neoplasm of skin: Secondary | ICD-10-CM | POA: Diagnosis not present

## 2017-01-21 DIAGNOSIS — L570 Actinic keratosis: Secondary | ICD-10-CM | POA: Diagnosis not present

## 2017-01-21 DIAGNOSIS — D1801 Hemangioma of skin and subcutaneous tissue: Secondary | ICD-10-CM | POA: Diagnosis not present

## 2017-01-21 DIAGNOSIS — D485 Neoplasm of uncertain behavior of skin: Secondary | ICD-10-CM | POA: Diagnosis not present

## 2017-01-21 DIAGNOSIS — L308 Other specified dermatitis: Secondary | ICD-10-CM | POA: Diagnosis not present

## 2017-01-21 DIAGNOSIS — D225 Melanocytic nevi of trunk: Secondary | ICD-10-CM | POA: Diagnosis not present

## 2017-01-21 DIAGNOSIS — L821 Other seborrheic keratosis: Secondary | ICD-10-CM | POA: Diagnosis not present

## 2017-01-24 NOTE — Progress Notes (Signed)
Cardiology Office Note    Date:  01/25/2017   ID:  KAAN TOSH, DOB 02-Apr-1934, MRN [id number]  PCP:  Mathews Argyle, MD  Cardiologist:  Dr. Tamala Julian   CC: follow up  History of Present Illness:  [name] is a 83 y.o. 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   I saw him in clinic for newly diagnosed systolic CHF (EF 47-42%). I ordered a nuclear stress test which returned high risk due to severely depressed LV function but no definite ischemia. He subsequently underwent L/RHC which showed closure of his SVG to LCx. His cardiomyopathy was felt to be out of proportion to his CAD. He saw Dr. Tamala Julian back in the office on 12/08/16 for follow up. He was unable to tolerate heart failure regimen including hydralazine/nitrates and coreg given hypotension and LE edema respectively. Dr Tamala Julian started spiro 25mg  daily. Follow up BMET was stable.  I saw him in clinic on 12/23/16 and he was doing quite well and feeling much better on the spirolactone. I added back Coreg 3.125mg  BID.   Today he presents to clinic for follow up. He is not sure why he is here today. Told he had to follow up. He doesn't feel any better then when I saw him last. Still very SOB, only with exertion. No chest pain. No LE edema, orthopnea or PND. No dizziness or syncope. No palpitations. Has chronic back pain that he takes prednisone for.    Past Medical History:  Diagnosis Date  . Arthritis   . Azotemia   . Cancer [facility]) 2010   prostate  . Chondromalacia   . Coronary artery disease   . Diabetes mellitus without complication ([place])   . GERD (gastroesophageal reflux disease)   . Glaucoma    "high normal"  . Gout   . H/O blood clots    R Lower leg, right upper leg  . Heel spur   . Hyperlipidemia   . Hypertension   . Joint pain   . Lesion of  left native kidney   . Low back pain    "severe"  . Mild aortic stenosis   . Myocardial infarction    "told me I had a small heart attack"  . Obesity   . Osteopenia   . Osteoporosis   . Polymyalgia ([place])    h/o  . Renal cyst, left   . Shingles    LEFT ABDOMEN  . Spinal stenosis   . Thrombophlebitis   . Tinnitus   . Tubular adenoma     Past Surgical History:  Procedure Laterality Date  . APPENDECTOMY  60 years ago  . CARDIAC CATHETERIZATION    . CARDIAC CATHETERIZATION N/A 11/03/2016   Procedure: Right/Left Heart Cath and Coronary/Graft Angiography;  Surgeon: Belva Crome, MD;  Location: [facility];  Service: Cardiovascular;  Laterality: N/A;  . CORONARY ARTERY BYPASS GRAFT  2007  . PROSTATE SURGERY     seed implant  . ROBOTIC ASSITED PARTIAL NEPHRECTOMY Left 08/31/2013   Procedure: ROBOTIC ASSITED PARTIAL NEPHRECTOMY;  Surgeon: Dutch Gray, MD;  Location: [facility];  Service: Urology;  Laterality: Left;  Marland Kitchen VEIN LIGATION AND STRIPPING Right 1972   right lower leg    Current Medications: Outpatient Medications Prior to Visit  Medication Sig Dispense Refill  . alendronate (FOSAMAX)  70 MG tablet Take 70 mg by mouth daily. Take with a full glass of water on an empty stomach. Patient takes this medication once weekly on Saturday.    Marland Kitchen allopurinol (ZYLOPRIM) 300 MG tablet Take 300 mg by mouth daily.  2  . aspirin 81 MG chewable tablet Chew 81 mg by mouth daily.    . Calcium Carbonate-Vitamin D 600-200 MG-UNIT TABS Take 1 tablet by mouth daily.    . carvedilol (COREG) 3.125 MG tablet Take 1 tablet (3.125 mg total) by mouth 2 (two) times daily. 60 tablet 3  . Cholecalciferol (VITAMIN D) 2000 units CAPS Take 1 capsule by mouth daily.    Marland Kitchen glipiZIDE (GLUCOTROL) 10 MG tablet Take 10 mg by mouth 2 (two) times daily before a meal.   2  . HYDROcodone-acetaminophen (NORCO) 10-325 MG tablet Take 1-2 tablets by mouth every 6 (six) hours as needed for moderate pain. 10 tablet 0  .  indomethacin (INDOCIN) 50 MG capsule Take 50 mg by mouth as needed for mild pain or moderate pain.    . Multiple Vitamin (MULTIVITAMIN) tablet Take 1 tablet by mouth daily.    . predniSONE (DELTASONE) 10 MG tablet Take 10 mg by mouth daily with breakfast.    . rosuvastatin (CRESTOR) 10 MG tablet Take 10 mg by mouth daily.    Marland Kitchen spironolactone (ALDACTONE) 25 MG tablet Take 1 tablet (25 mg total) by mouth daily. 30 tablet 11   No facility-administered medications prior to visit.      Allergies:   Adacel [diphth-acell pertussis-tetanus]; Flexeril [cyclobenzaprine]; Lipitor [atorvastatin]; Pantoprazole; Penicillins; Septra [sulfamethoxazole-trimethoprim]; and Simvastatin   Social History   Social History  . Marital status: Married    Spouse name: N/A  . Number of children: N/A  . Years of education: N/A   Social History Main Topics  . Smoking status: Former Smoker    Packs/day: 3.00    Years: 30.00    Types: Cigarettes    Quit date: 12/21/1980  . Smokeless tobacco: Never Used  . Alcohol use No  . Drug use: No  . Sexual activity: Not on file   Other Topics Concern  . Not on file   Social History Narrative  . No narrative on file     Family History:  The patient's *family history includes Heart Problems in his father and mother.      ROS:   Please see the history of present illness.    ROS All other systems reviewed and are negative.   PHYSICAL EXAM:   VS:  BP 134/74   Pulse 78   Ht 5' 6.5" (1.689 m)   Wt 198 lb (89.8 kg)   BMI 31.48 kg/m    GEN: Well nourished, well developed, in no acute distress  HEENT: normal  Neck: no JVD, carotid bruits, or masses Cardiac: RRR; + murmur, rubs, or gallops,no edema  Respiratory:  clear to auscultation bilaterally, normal work of breathing GI: soft, nontender, nondistended, + BS MS: no deformity or atrophy  Skin: warm and dry, no rash Neuro:  Alert and Oriented x 3, Strength and sensation are intact Psych: euthymic mood, full  affect    Wt Readings from Last 3 Encounters:  01/25/17 198 lb (89.8 kg)  12/23/16 194 lb 1.9 oz (88.1 kg)  12/08/16 198 lb (89.8 kg)      Studies/Labs Reviewed:   EKG:  EKG is NOT ordered today.    Recent Labs: 10/29/2016: Hemoglobin 12.4; Platelets 221 12/18/2016: BUN 33; Creat  1.83; Potassium 4.3; Sodium 138   Lipid Panel No results found for: CHOL, TRIG, HDL, CHOLHDL, VLDL, LDLCALC, LDLDIRECT  Additional studies/ records that were reviewed today include:  2D ECHO: 09/29/2016 LV EF: 20% - 25% Study Conclusions - Left ventricle: The cavity size was mildly dilated. Wall thickness was increased in a pattern of mild LVH. Systolic function was severely reduced. The estimated ejection fraction was in the range of 20% to 25%. Severe hypokinesis of the apicalanteroseptal, inferior, and apical myocardium. Doppler parameters are consistent with abnormal left ventricular relaxation (grade 1 diastolic dysfunction). - Aortic valve: There was mild stenosis. Valve area (VTI): 1.14 cm^2. Valve area (Vmax): 1.27 cm^2. Valve area (Vmean): 0.96 cm^2. - Mitral valve: Moderately calcified annulus. There was mild regurgitation.   Nuclear stress test 10/22/16 Study Highlights    Nuclear stress EF: 23%.  There was no ST segment deviation noted during stress.  Defect 1: There is a small defect of mild severity present in the basal inferior location.  This is a high risk study.  High risk study due to severely depressed left ventricular systolic function. No reversible ischemia is seen.  There is a small fixed inferior defect that is likely due to diaphragmatic attenuation, although cannot exclude a small infarction.. Study suggestive of nonischemic cardiomyopathy, but cannot exclude multivessel CAD with "balanced ischemia".     11/03/16 Right/Left Heart Cath and Coronary/Graft Angiography  Conclusion   Severe native vessel coronary disease with total  occlusion of the left main and native right coronary.  Bypass graft failure with occlusion of the saphenous vein graft to the ramus intermedius.  Widely patent saphenous vein graft to the PDA and PL branch.  Widely patent LIMA to the LAD  Normal pulmonary artery pressures including capillary wedge.      ASSESSMENT & PLAN:   SOB: will obtain PFTs and BNP for further eval of SOB.   Chronic systolic CHF:  appears euvolemic but still very SOB, will continue spiro and coreg. He does not tolerate hydralazine/nitrates. No ACE/ARB due to CKD. Will check BNP   CAD s/p CABG: cath on 11/03/16 with failure of SVG--> RI. Continue ASA and statin. No chest pain but a lot of DOE  HTN: more recently has had hypotension. We are trying to add medications to treat his CHF. Tolerating spiro 25mg  daily and Coreg 3.25mg BID   HLD: continue statin   CKD/RCC s/p partial nephrectomy: check BMET today  DMT2: continue current regimen     Medication Adjustments/Labs and Tests Ordered: Current medicines are reviewed at length with the patient today.  Concerns regarding medicines are outlined above.  Medication changes, Labs and Tests ordered today are listed in the Patient Instructions below. Patient Instructions  Medication Instructions:  Your physician recommends that you continue on your current medications as directed. Please refer to the Current Medication list given to you today.   Labwork: TODAY:  BMP & PRO BNP  Testing/Procedures: Your physician has recommended that you have a pulmonary function test. Pulmonary Function Tests are a group of tests that measure how well air moves in and out of your lungs.    Follow-Up: Your physician recommends that you schedule a follow-up appointment in: [place]    Any Other Special Instructions Will Be Listed Below (If Applicable).  \Pulmonary Function Tests Pulmonary function tests (PFTs) measure how well your lungs are  working. The tests can help to identify the causes of lung problems. They can also help your health  care provider select the best treatment for you. Your health care provider may order pulmonary function for any of the following reasons:  When an illness involving the lungs is suspected.  To follow changes in your lung function over time if you are known to have a chronic lung disease.  For industrial plant workers to examine the effects of being exposed to chemicals over a long period of time.  To assess lung function prior to surgery or other procedures.  For people who are smokers. Your measured lung function will be compared to the expected lung function of someone with healthy lungs who is similar to you in age, gender, size, and other factors. This is used to determine your "percent predicted" lung function, which is how your health care provider knows if your lung function is normal or abnormal. If you have had prior pulmonary function testing performed, your health care provider will also compare your current results with past tests to see if your lung function is better, worse, or staying the same. This can sometimes be useful to see if treatments are working.  LET [facility] CARE PROVIDER KNOW ABOUT:  Any allergies you have.  All medicines you are taking, including inhaler or nebulizer medicines, vitamins, herbs, eye drops, creams, and over-the-counter medicines.  Any blood disorders you have.  Previous surgeries you have had, especially recent eye surgery, abdominal surgery, or chest surgery. These can make performing pulmonary function tests difficult or unsafe.  Medical conditions you have.  Chest pain or heart problems.  Tuberculosis or respiratory infections, such as pneumonia, a cold, or the flu. If you think you will have difficulty performing any of the breathing maneuvers, ask your health care provider if you should reschedule the test. RISKS AND  COMPLICATIONS: Generally, pulmonary function testing is a safe procedure. However, as with any procedure, complications can occur. Possible complications include:  Lightheadedness due to overbreathing (hyperventilation).  An asthmatic attack from deep breathing. BEFORE THE PROCEDURE  Take medicine as directed by your health care provider. If you take inhaler or nebulizer medicines, ask your health care provider which medicines you should take on the day of your test. Some inhaler medicines may interfere with pulmonary function tests, such as bronchodilator testing, if taken shortly before the test.  Avoid eating a large meal before your test.  Do not smoke before your test.  Wear comfortable clothing which will not interfere with breathing. PROCEDURE  You will be given a soft nose clip to wear during the procedure. This is done so that all of your breaths will go through your mouth instead of your nose.  You will be given a germ-free (sterile) mouthpiece. It will be attached to a spirometer. The spirometer is the machine that measures your breathing.  You will be instructed to perform various breathing maneuvers. The maneuvers will be done by breathing in (inhaling) and breathing out (exhaling). Depending on what measurements are ordered, you may be asked to repeat the maneuvers several times before the test is completed.  It is important to follow the instructions exactly to obtain accurate results. Make sure to blow as hard and as fast as you can when you are instructed to do so.  You may be given a bronchodilator after testing has been performed. A bronchodilator is a medicine which makes the small air passages in your lungs larger. These medicines usually make it easier to breathe. The tests are then repeated several minutes later after the bronchodilator has taken effect.  You will be monitored carefully during the procedure for faintness, dizziness, difficulty breathing, or any other  problems. AFTER THE PROCEDURE   You may resume your usual diet, medicines, and activities as directed by your health care provider.  Your health care provider will go over your test results with you and determine what treatments may be helpful. This information is not intended to replace advice given to you by your health care provider. Make sure you discuss any questions you have with your health care provider. Document Released: 07/30/2004 Document Revised: 09/27/2013 Document Reviewed: 07/06/2013 Elsevier Interactive Patient Education  2017 [employer].    If you need a refill on your cardiac medications before your next appointment, please call your pharmacy.      Signed, Angelena Form, PA-C  01/25/2017 4:02 PM    [facility] [address], [place], [place]  [address] Phone: [phone]; Fax: [phone]

## 2017-01-25 ENCOUNTER — Other Ambulatory Visit: Payer: Self-pay | Admitting: Physician Assistant

## 2017-01-25 ENCOUNTER — Ambulatory Visit (INDEPENDENT_AMBULATORY_CARE_PROVIDER_SITE_OTHER): Payer: PPO | Admitting: Physician Assistant

## 2017-01-25 VITALS — BP 134/74 | HR 78 | Ht 66.5 in | Wt 198.0 lb

## 2017-01-25 DIAGNOSIS — R0602 Shortness of breath: Secondary | ICD-10-CM | POA: Diagnosis not present

## 2017-01-25 DIAGNOSIS — C642 Malignant neoplasm of left kidney, except renal pelvis: Secondary | ICD-10-CM | POA: Diagnosis not present

## 2017-01-25 DIAGNOSIS — N183 Chronic kidney disease, stage 3 unspecified: Secondary | ICD-10-CM

## 2017-01-25 DIAGNOSIS — I10 Essential (primary) hypertension: Secondary | ICD-10-CM

## 2017-01-25 DIAGNOSIS — I5022 Chronic systolic (congestive) heart failure: Secondary | ICD-10-CM | POA: Diagnosis not present

## 2017-01-25 DIAGNOSIS — E785 Hyperlipidemia, unspecified: Secondary | ICD-10-CM

## 2017-01-25 DIAGNOSIS — E119 Type 2 diabetes mellitus without complications: Secondary | ICD-10-CM

## 2017-01-25 MED ORDER — SPIRONOLACTONE 25 MG PO TABS: 25.0000 mg | ORAL_TABLET | Freq: Every day | ORAL | 11 refills | Status: DC

## 2017-01-25 MED ORDER — SPIRONOLACTONE 25 MG PO TABS: 25.0000 mg | ORAL_TABLET | Freq: Every day | ORAL | 0 refills | Status: DC

## 2017-01-25 NOTE — Patient Instructions (Addendum)
Medication Instructions:  Your physician recommends that you continue on your current medications as directed. Please refer to the Current Medication list given to you today.   Labwork: TODAY:  BMP & PRO BNP  Testing/Procedures: Your physician has recommended that you have a pulmonary function test. Pulmonary Function Tests are a group of tests that measure how well air moves in and out of your lungs.    Follow-Up: Your physician recommends that you schedule a follow-up appointment in: [place]    Any Other Special Instructions Will Be Listed Below (If Applicable).  \Pulmonary Function Tests Pulmonary function tests (PFTs) measure how well your lungs are working. The tests can help to identify the causes of lung problems. They can also help your health care provider select the best treatment for you. Your health care provider may order pulmonary function for any of the following reasons:  When an illness involving the lungs is suspected.  To follow changes in your lung function over time if you are known to have a chronic lung disease.  For industrial plant workers to examine the effects of being exposed to chemicals over a long period of time.  To assess lung function prior to surgery or other procedures.  For people who are smokers. Your measured lung function will be compared to the expected lung function of someone with healthy lungs who is similar to you in age, gender, size, and other factors. This is used to determine your "percent predicted" lung function, which is how your health care provider knows if your lung function is normal or abnormal. If you have had prior pulmonary function testing performed, your health care provider will also compare your current results with past tests to see if your lung function is better, worse, or staying the same. This can sometimes be useful to see if treatments are working.  LET [facility] CARE PROVIDER KNOW  ABOUT:  Any allergies you have.  All medicines you are taking, including inhaler or nebulizer medicines, vitamins, herbs, eye drops, creams, and over-the-counter medicines.  Any blood disorders you have.  Previous surgeries you have had, especially recent eye surgery, abdominal surgery, or chest surgery. These can make performing pulmonary function tests difficult or unsafe.  Medical conditions you have.  Chest pain or heart problems.  Tuberculosis or respiratory infections, such as pneumonia, a cold, or the flu. If you think you will have difficulty performing any of the breathing maneuvers, ask your health care provider if you should reschedule the test. RISKS AND COMPLICATIONS: Generally, pulmonary function testing is a safe procedure. However, as with any procedure, complications can occur. Possible complications include:  Lightheadedness due to overbreathing (hyperventilation).  An asthmatic attack from deep breathing. BEFORE THE PROCEDURE  Take medicine as directed by your health care provider. If you take inhaler or nebulizer medicines, ask your health care provider which medicines you should take on the day of your test. Some inhaler medicines may interfere with pulmonary function tests, such as bronchodilator testing, if taken shortly before the test.  Avoid eating a large meal before your test.  Do not smoke before your test.  Wear comfortable clothing which will not interfere with breathing. PROCEDURE  You will be given a soft nose clip to wear during the procedure. This is done so that all of your breaths will go through your mouth instead of your nose.  You will be given a germ-free (sterile) mouthpiece. It will be attached to a spirometer. The spirometer  is the machine that measures your breathing.  You will be instructed to perform various breathing maneuvers. The maneuvers will be done by breathing in (inhaling) and breathing out (exhaling). Depending on what  measurements are ordered, you may be asked to repeat the maneuvers several times before the test is completed.  It is important to follow the instructions exactly to obtain accurate results. Make sure to blow as hard and as fast as you can when you are instructed to do so.  You may be given a bronchodilator after testing has been performed. A bronchodilator is a medicine which makes the small air passages in your lungs larger. These medicines usually make it easier to breathe. The tests are then repeated several minutes later after the bronchodilator has taken effect.  You will be monitored carefully during the procedure for faintness, dizziness, difficulty breathing, or any other problems. AFTER THE PROCEDURE   You may resume your usual diet, medicines, and activities as directed by your health care provider.  Your health care provider will go over your test results with you and determine what treatments may be helpful. This information is not intended to replace advice given to you by your health care provider. Make sure you discuss any questions you have with your health care provider. Document Released: 07/30/2004 Document Revised: 09/27/2013 Document Reviewed: 07/06/2013 Elsevier Interactive Patient Education  2017 [employer].    If you need a refill on your cardiac medications before your next appointment, please call your pharmacy.

## 2017-01-26 ENCOUNTER — Telehealth: Payer: Self-pay | Admitting: *Deleted

## 2017-01-26 DIAGNOSIS — I270 Primary pulmonary hypertension: Secondary | ICD-10-CM

## 2017-01-26 DIAGNOSIS — I10 Essential (primary) hypertension: Secondary | ICD-10-CM

## 2017-01-26 LAB — BASIC METABOLIC PANEL
BUN / CREAT RATIO: 19 (ref 10–24)
BUN: 28 mg/dL — AB (ref 8–27)
CALCIUM: 10.5 mg/dL — AB (ref 8.6–10.2)
CO2: 24 mmol/L (ref 18–29)
Chloride: 100 mmol/L (ref 96–106)
Creatinine, Ser: 1.45 mg/dL — ABNORMAL HIGH (ref 0.76–1.27)
GFR, EST AFRICAN AMERICAN: 52 mL/min/{1.73_m2} — AB (ref >59)
GFR, EST NON AFRICAN AMERICAN: 45 mL/min/{1.73_m2} — AB (ref >59)
Glucose: 196 mg/dL — ABNORMAL HIGH (ref 65–99)
Potassium: 4.9 mmol/L (ref 3.5–5.2)
Sodium: 142 mmol/L (ref 134–144)

## 2017-01-26 LAB — PRO B NATRIURETIC PEPTIDE: NT-Pro BNP: 1209 pg/mL — ABNORMAL HIGH (ref 0–486)

## 2017-01-26 MED ORDER — FUROSEMIDE 20 MG PO TABS
20.0000 mg | ORAL_TABLET | Freq: Every day | ORAL | 3 refills | Status: DC
Start: 2017-01-26 — End: 2017-02-03

## 2017-01-26 NOTE — Telephone Encounter (Signed)
-----   Message from Eileen Stanford, PA-C sent at 01/26/2017  8:32 AM EST ----- Creat is better. BNP is elevated which could definitely be contributing to SOB. Lets add lasix 20mg  daily and get a BMET in a week and see how he is breathing.

## 2017-01-27 ENCOUNTER — Ambulatory Visit (INDEPENDENT_AMBULATORY_CARE_PROVIDER_SITE_OTHER): Payer: PPO | Admitting: Interventional Cardiology

## 2017-01-27 ENCOUNTER — Encounter: Payer: Self-pay | Admitting: Interventional Cardiology

## 2017-01-27 VITALS — BP 126/76 | HR 70 | Ht 66.5 in | Wt 196.4 lb

## 2017-01-27 DIAGNOSIS — I25810 Atherosclerosis of coronary artery bypass graft(s) without angina pectoris: Secondary | ICD-10-CM

## 2017-01-27 DIAGNOSIS — I5021 Acute systolic (congestive) heart failure: Secondary | ICD-10-CM | POA: Diagnosis not present

## 2017-01-27 DIAGNOSIS — I10 Essential (primary) hypertension: Secondary | ICD-10-CM

## 2017-01-27 DIAGNOSIS — I350 Nonrheumatic aortic (valve) stenosis: Secondary | ICD-10-CM

## 2017-01-27 NOTE — Patient Instructions (Signed)
Medication Instructions:  Start your Furosemide today.  Labwork: Keep your upcoming lab appointment  Testing/Procedures: None  Follow-Up: Your physician recommends that you schedule a follow-up appointment in: 1 month with Dr. Tamala Julian or Bonney Leitz, PA-C.   Any Other Special Instructions Will Be Listed Below (If Applicable).     If you need a refill on your cardiac medications before your next appointment, please call your pharmacy.

## 2017-01-27 NOTE — Progress Notes (Addendum)
Cardiology Office Note    Date:  01/27/2017   ID:  [name], DOB 05-22-1934, MRN [id number]  PCP:  Mathews Argyle, MD  Cardiologist: Sinclair Grooms, MD   No chief complaint on file.   History of Present Illness:  [name] is a 83 y.o. 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 Cardiac catheterization revealed bypass graft closure but extent of LV dysfunction was out of proportion to coronary disease.  Unable to tolerate long-acting nitrates and hydralazine. Spironolactone was started and there was gradual improvement in lower extremity edema and dyspnea. Dyspnea still a significant problem. He denies orthopnea. He was last seen 48 hours ago by Nell Range, PAC and furosemide 20 mg per day was started. He has not yet tried the furosemide because he wanted to see what I felt about the addition of therapy.  Heavy physical activity least a substernal burning. Says better since starting spironolactone and having some fluid removal.   Past Medical History:  Diagnosis Date  . Arthritis   . Azotemia   . Cancer [facility]) 2010   prostate  . Chondromalacia   . Coronary artery disease   . Diabetes mellitus without complication ([place])   . GERD (gastroesophageal reflux disease)   . Glaucoma    "high normal"  . Gout   . H/O blood clots    R Lower leg, right upper leg  . Heel spur   . Hyperlipidemia   . Hypertension   . Joint pain   . Lesion of left native kidney   . Low back pain    "severe"  . Mild aortic stenosis   . Myocardial infarction    "told me I had a small heart attack"  . Obesity   . Osteopenia   . Osteoporosis   . Polymyalgia ([place])    h/o  . Renal cyst, left   . Shingles    LEFT ABDOMEN  . Spinal stenosis   . Thrombophlebitis   . Tinnitus   . Tubular adenoma     Past  Surgical History:  Procedure Laterality Date  . APPENDECTOMY  60 years ago  . CARDIAC CATHETERIZATION    . CARDIAC CATHETERIZATION N/A 11/03/2016   Procedure: Right/Left Heart Cath and Coronary/Graft Angiography;  Surgeon: Belva Crome, MD;  Location: [place] [facility];  Service: Cardiovascular;  Laterality: N/A;  . CORONARY ARTERY BYPASS GRAFT  2007  . PROSTATE SURGERY     seed implant  . ROBOTIC ASSITED PARTIAL NEPHRECTOMY Left 08/31/2013   Procedure: ROBOTIC ASSITED PARTIAL NEPHRECTOMY;  Surgeon: Dutch Gray, MD;  Location: [facility];  Service: Urology;  Laterality: Left;  Marland Kitchen VEIN LIGATION AND STRIPPING Right 1972   right lower leg    Current Medications: Outpatient Medications Prior to Visit  Medication Sig Dispense Refill  . allopurinol (ZYLOPRIM) 300 MG tablet Take 300 mg by mouth daily.  2  . aspirin 81 MG chewable tablet Chew 81 mg by mouth daily.    . Calcium Carbonate-Vitamin D 600-200 MG-UNIT TABS Take 1 tablet by mouth daily.    . carvedilol (COREG) 3.125 MG tablet Take 1 tablet (3.125 mg total) by mouth 2 (two) times daily. 60 tablet 3  . Cholecalciferol (VITAMIN D) 2000 units CAPS Take 1 capsule by mouth daily.    . furosemide (LASIX)  20 MG tablet Take 1 tablet (20 mg total) by mouth daily. 90 tablet 3  . glipiZIDE (GLUCOTROL) 10 MG tablet Take 10 mg by mouth 2 (two) times daily before a meal.   2  . HYDROcodone-acetaminophen (NORCO) 10-325 MG tablet Take 1-2 tablets by mouth every 6 (six) hours as needed for moderate pain. 10 tablet 0  . indomethacin (INDOCIN) 50 MG capsule Take 50 mg by mouth as needed for mild pain or moderate pain.    . methocarbamol (ROBAXIN) 500 MG tablet Take 500 mg by mouth every 6 (six) hours as needed for muscle spasms.    . Multiple Vitamin (MULTIVITAMIN) tablet Take 1 tablet by mouth daily.    . predniSONE (DELTASONE) 10 MG tablet Take 10 mg by mouth daily with breakfast.    . rosuvastatin (CRESTOR) 10 MG tablet Take 10 mg by mouth daily.    Marland Kitchen  spironolactone (ALDACTONE) 25 MG tablet Take 1 tablet (25 mg total) by mouth daily. 90 tablet 0  . tamsulosin (FLOMAX) 0.4 MG CAPS capsule Take 0.4 mg by mouth daily after supper.    . vitamin B-12 (CYANOCOBALAMIN) 1000 MCG tablet Take 1,000 mcg by mouth daily.    Marland Kitchen alendronate (FOSAMAX) 70 MG tablet Take 70 mg by mouth daily. Take with a full glass of water on an empty stomach. Patient takes this medication once weekly on Saturday.     No facility-administered medications prior to visit.      Allergies:   Adacel [diphth-acell pertussis-tetanus]; Flexeril [cyclobenzaprine]; Lipitor [atorvastatin]; Pantoprazole; Penicillins; Septra [sulfamethoxazole-trimethoprim]; and Simvastatin   Social History   Social History  . Marital status: Married    Spouse name: N/A  . Number of children: N/A  . Years of education: N/A   Social History Main Topics  . Smoking status: Former Smoker    Packs/day: 3.00    Years: 30.00    Types: Cigarettes    Quit date: 12/21/1980  . Smokeless tobacco: Never Used  . Alcohol use No  . Drug use: No  . Sexual activity: Not Asked   Other Topics Concern  . None   Social History Narrative  . None     Family History:  The patient's family history includes Heart Problems in his father and mother.   ROS:   Please see the history of present illness.    Frequent low volume urination. Chronic severe low back discomfort. Easy bruising. Excessive fatigue.  All other systems reviewed and are negative.   PHYSICAL EXAM:   VS:  BP 126/76 (BP Location: Right Arm)   Pulse 70   Ht 5' 6.5" (1.689 m)   Wt 196 lb 6.4 oz (89.1 kg)   BMI 31.22 kg/m    GEN: Well nourished, well developed, in no acute distress . Obese. HEENT: normal  Neck: no JVD, carotid bruits, or masses Cardiac: RRR; 2/6 SEM murmur. No rubs, or gallops,no edema  Respiratory:  clear to auscultation bilaterally, normal work of breathing GI: soft, nontender, nondistended, + BS MS: no deformity or  atrophy  Skin: warm and dry, no rash Neuro:  Alert and Oriented x 3, Strength and sensation are intact Psych: euthymic mood, full affect  Wt Readings from Last 3 Encounters:  01/27/17 196 lb 6.4 oz (89.1 kg)  01/25/17 198 lb (89.8 kg)  12/23/16 194 lb 1.9 oz (88.1 kg)      Studies/Labs Reviewed:   EKG:  EKG  Performed on today's visit.  Recent Labs: 10/29/2016: Hemoglobin 12.4; Platelets 221  01/25/2017: BUN 28; Creatinine, Ser 1.45; NT-Pro BNP 1,209; Potassium 4.9; Sodium 142   Lipid Panel No results found for: CHOL, TRIG, HDL, CHOLHDL, VLDL, LDLCALC, LDLDIRECT  Additional studies/ records that were reviewed today include:  None performed that are new.    ASSESSMENT:    1. Acute systolic heart failure ([place])   2. Coronary artery disease involving coronary bypass graft of native heart without angina pectoris   3. Essential hypertension      PLAN:  In order of problems listed above:  1. Slow progress with medication titration. He is currently on spironolactone and beta blocker therapy. Beta blocker therapy has not been optimized. Furosemide was added 2 days ago, which I agree with. This may help further decrease limitations due to dyspnea. He will return in one month at which time ARB or ACE therapy should be considered, depending on kidney function. If kidney function will not allow, we should reinstitute low-dose nitrates and hydralazine. I would love for him to be on Entresto. Hopefully low dose of this medication can be started at the next visit. Basic metabolic panel will be done in 7 days to monitor the effects of furosemide on kidney function. 2. Angina with moderate activity. 3. Adequate blood pressure to add additional therapy is needed. 4.  Mild by recent echo.  Plan is for clinical follow-up with me or Nell Range in 4 weeks. Blood work will be done in one week to monitor the impact of furosemide on kidney function. Hopefully, Entresto low dose can be  started.    Medication Adjustments/Labs and Tests Ordered: Current medicines are reviewed at length with the patient today.  Concerns regarding medicines are outlined above.  Medication changes, Labs and Tests ordered today are listed in the Patient Instructions below. There are no Patient Instructions on file for this visit.   Signed, Sinclair Grooms, MD  01/27/2017 9:22 AM    [facility] [address], [place], [place]  [address] Phone: [phone]; Fax: [phone]

## 2017-01-29 ENCOUNTER — Ambulatory Visit (INDEPENDENT_AMBULATORY_CARE_PROVIDER_SITE_OTHER): Payer: PPO | Admitting: Internal Medicine

## 2017-01-29 DIAGNOSIS — R0602 Shortness of breath: Secondary | ICD-10-CM | POA: Diagnosis not present

## 2017-01-29 LAB — PULMONARY FUNCTION TEST
DL/VA % pred: 98 %
DL/VA: 4.26 ml/min/mmHg/L
DLCO COR % PRED: 75 %
DLCO UNC: 20.4 ml/min/mmHg
DLCO cor: 20.4 ml/min/mmHg
DLCO unc % pred: 75 %
FEF 25-75 POST: 2.7 L/s
FEF 25-75 Pre: 1.89 L/sec
FEF2575-%Change-Post: 42 %
FEF2575-%Pred-Post: 170 %
FEF2575-%Pred-Pre: 119 %
FEV1-%CHANGE-POST: 7 %
FEV1-%Pred-Post: 98 %
FEV1-%Pred-Pre: 92 %
FEV1-POST: 2.32 L
FEV1-PRE: 2.16 L
FEV1FVC-%CHANGE-POST: 5 %
FEV1FVC-%Pred-Pre: 111 %
FEV6-%Change-Post: 1 %
FEV6-%PRED-PRE: 88 %
FEV6-%Pred-Post: 89 %
FEV6-POST: 2.78 L
FEV6-PRE: 2.74 L
FEV6FVC-%PRED-POST: 107 %
FEV6FVC-%PRED-PRE: 107 %
FVC-%CHANGE-POST: 2 %
FVC-%PRED-PRE: 81 %
FVC-%Pred-Post: 83 %
FVC-POST: 2.8 L
FVC-PRE: 2.74 L
POST FEV6/FVC RATIO: 100 %
PRE FEV6/FVC RATIO: 100 %
Post FEV1/FVC ratio: 83 %
Pre FEV1/FVC ratio: 79 %
RV % PRED: 120 %
RV: 2.94 L
TLC % PRED: 89 %
TLC: 5.58 L

## 2017-02-02 ENCOUNTER — Other Ambulatory Visit: Payer: PPO | Admitting: *Deleted

## 2017-02-02 DIAGNOSIS — I10 Essential (primary) hypertension: Secondary | ICD-10-CM | POA: Diagnosis not present

## 2017-02-02 LAB — BASIC METABOLIC PANEL
BUN/Creatinine Ratio: 22 (ref 10–24)
BUN: 49 mg/dL — ABNORMAL HIGH (ref 8–27)
CO2: 23 mmol/L (ref 18–29)
CREATININE: 2.26 mg/dL — AB (ref 0.76–1.27)
Calcium: 9.8 mg/dL (ref 8.6–10.2)
Chloride: 98 mmol/L (ref 96–106)
GFR calc Af Amer: 31 mL/min/{1.73_m2} — ABNORMAL LOW (ref >59)
GFR calc non Af Amer: 26 mL/min/{1.73_m2} — ABNORMAL LOW (ref >59)
GLUCOSE: 125 mg/dL — AB (ref 65–99)
POTASSIUM: 4.1 mmol/L (ref 3.5–5.2)
SODIUM: 144 mmol/L (ref 134–144)

## 2017-02-03 ENCOUNTER — Telehealth: Payer: Self-pay | Admitting: Interventional Cardiology

## 2017-02-03 DIAGNOSIS — I10 Essential (primary) hypertension: Secondary | ICD-10-CM

## 2017-02-03 NOTE — Telephone Encounter (Signed)
Spoke with pt and went over recommendations per Dr. Tamala Julian.  Pt in agreement to come for labs on Friday.

## 2017-02-03 NOTE — Telephone Encounter (Signed)
Stop furosemide. Hold spironolactone. Basic metabolic panel on Friday New instructions concerning diuretic therapy will be given after blood work is stressed

## 2017-02-03 NOTE — Telephone Encounter (Signed)
New Message   Pt c/o medication issue:  1. Name of Medication:   furosemide (LASIX) 20 MG tablet   2. How are you currently taking this medication (dosage and times per day)? Every morning  3. Are you having a reaction (difficulty breathing--STAT)? No  4. What is your medication issue? Per wife pt having leg craps, dry mouth, and has no energy

## 2017-02-03 NOTE — Telephone Encounter (Signed)
Pt states he has been having leg cramps, dry mouth and no energy since this weekend.  Cramps started last Friday.  He takes a med that helps with leg cramps and they resolve (doesnt remember the name).  Yesterday morning BP was 131/68, HR 61.  Denies other cardiac symptoms.  Had labs drawn yesterday. States these symptoms started after starting Furosemide. Advised pt I would send message to Dr. Tamala Julian for review and advisement.  Labs are available in pt's chart.  BUN and crea are elevated.

## 2017-02-05 ENCOUNTER — Other Ambulatory Visit: Payer: PPO | Admitting: *Deleted

## 2017-02-05 DIAGNOSIS — I25810 Atherosclerosis of coronary artery bypass graft(s) without angina pectoris: Secondary | ICD-10-CM

## 2017-02-05 DIAGNOSIS — E7800 Pure hypercholesterolemia, unspecified: Secondary | ICD-10-CM

## 2017-02-05 LAB — BASIC METABOLIC PANEL
BUN/Creatinine Ratio: 21 (ref 10–24)
BUN: 51 mg/dL — ABNORMAL HIGH (ref 8–27)
CALCIUM: 10.4 mg/dL — AB (ref 8.6–10.2)
CHLORIDE: 98 mmol/L (ref 96–106)
CO2: 24 mmol/L (ref 18–29)
Creatinine, Ser: 2.39 mg/dL — ABNORMAL HIGH (ref 0.76–1.27)
GFR calc Af Amer: 29 mL/min/{1.73_m2} — ABNORMAL LOW (ref >59)
GFR calc non Af Amer: 25 mL/min/{1.73_m2} — ABNORMAL LOW (ref >59)
Glucose: 92 mg/dL (ref 65–99)
Potassium: 4.3 mmol/L (ref 3.5–5.2)
SODIUM: 143 mmol/L (ref 134–144)

## 2017-02-05 NOTE — Addendum Note (Signed)
Addended by: Eulis Foster on: 02/05/2017 10:45 AM   Modules accepted: Orders

## 2017-02-08 ENCOUNTER — Telehealth: Payer: Self-pay | Admitting: *Deleted

## 2017-02-08 DIAGNOSIS — I5021 Acute systolic (congestive) heart failure: Secondary | ICD-10-CM

## 2017-02-08 DIAGNOSIS — I10 Essential (primary) hypertension: Secondary | ICD-10-CM

## 2017-02-08 NOTE — Telephone Encounter (Signed)
Spoke with pt and went over recommendations per Dr. Smith.  Pt verbalized understanding and was in agreement with this plan.   

## 2017-02-08 NOTE — Telephone Encounter (Signed)
-----   Message from Belva Crome, MD sent at 02/06/2017 10:40 AM EST ----- Unable to tolerate even low dose lasix.He is now off lasix and spironolactone. Please repeat BMET Thursday and so we can decide hen to resume Arlyce Harman, Plan to hold aldactone until kidney function back to baseline, then resume 25 mg daily

## 2017-02-11 ENCOUNTER — Other Ambulatory Visit: Payer: PPO | Admitting: *Deleted

## 2017-02-11 DIAGNOSIS — M8589 Other specified disorders of bone density and structure, multiple sites: Secondary | ICD-10-CM | POA: Diagnosis not present

## 2017-02-11 DIAGNOSIS — Z79899 Other long term (current) drug therapy: Secondary | ICD-10-CM | POA: Diagnosis not present

## 2017-02-11 DIAGNOSIS — E118 Type 2 diabetes mellitus with unspecified complications: Secondary | ICD-10-CM | POA: Diagnosis not present

## 2017-02-11 DIAGNOSIS — N183 Chronic kidney disease, stage 3 (moderate): Secondary | ICD-10-CM | POA: Diagnosis not present

## 2017-02-11 DIAGNOSIS — M353 Polymyalgia rheumatica: Secondary | ICD-10-CM | POA: Diagnosis not present

## 2017-02-11 DIAGNOSIS — I10 Essential (primary) hypertension: Secondary | ICD-10-CM

## 2017-02-11 DIAGNOSIS — M545 Low back pain: Secondary | ICD-10-CM | POA: Diagnosis not present

## 2017-02-11 LAB — BASIC METABOLIC PANEL
BUN/Creatinine Ratio: 24 (ref 10–24)
BUN: 42 mg/dL — ABNORMAL HIGH (ref 8–27)
CALCIUM: 9.8 mg/dL (ref 8.6–10.2)
CO2: 26 mmol/L (ref 18–29)
CREATININE: 1.75 mg/dL — AB (ref 0.76–1.27)
Chloride: 97 mmol/L (ref 96–106)
GFR calc Af Amer: 42 — ABNORMAL LOW (ref >59)
GFR, EST NON AFRICAN AMERICAN: 36 — AB (ref >59)
Glucose: 229 mg/dL — ABNORMAL HIGH (ref 65–99)
Potassium: 5 mmol/L (ref 3.5–5.2)
Sodium: 138 mmol/L (ref 134–144)

## 2017-02-15 ENCOUNTER — Telehealth: Payer: Self-pay | Admitting: *Deleted

## 2017-02-15 NOTE — Telephone Encounter (Signed)
Lmtcb to go over lab results and recommendations.  

## 2017-02-16 ENCOUNTER — Telehealth: Payer: Self-pay | Admitting: Physician Assistant

## 2017-02-16 DIAGNOSIS — M461 Sacroiliitis, not elsewhere classified: Secondary | ICD-10-CM | POA: Diagnosis not present

## 2017-02-16 DIAGNOSIS — I5021 Acute systolic (congestive) heart failure: Secondary | ICD-10-CM

## 2017-02-16 NOTE — Telephone Encounter (Signed)
New message    Pt returning call to [place] about labs.

## 2017-02-16 NOTE — Telephone Encounter (Signed)
Spoke with pt and informed him of lab results.  Pt agreeable to come on 03/02/17 for repeat labs.

## 2017-02-23 NOTE — Progress Notes (Signed)
Cardiology Office Note    Date:  02/24/2017   ID:  [name], DOB 1934-09-18, MRN [id number]  PCP:  Mathews Argyle, MD  Cardiologist:  Dr. Tamala Julian   CC: follow up  History of Present Illness:  [name] is a 83 y.o. 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   I saw him in clinic for newly diagnosed systolic CHF (EF 85-46%). I ordered a nuclear stress test which returned high risk due to severely depressed LV function but no definite ischemia. He subsequently underwent L/RHC which showed closure of his SVG to LCx. His cardiomyopathy was felt to be out of proportion to his CAD. He saw Dr. Tamala Julian back in the office on 12/08/16 for follow up. He was unable to tolerate heart failure regimen including hydralazine/nitrates and coreg given hypotension and LE edema respectively. Dr Tamala Julian started spiro 25mg  daily. Follow up BMET was stable.  I saw him in clinic on 12/23/16 and he was doing quite well and feeling much better on the spirolactone. I added back Coreg 3.125mg  BID.   I saw him in clinic on 01/25/17. He was still SOB. I ordered a BNP and PFTs for further evaluation of his SOB. PFTs showed a small perfusion defect but did not explain significant SOB. His BNP was elevated to 1209. I started him on lasix 20mg  daily. He saw Dr. Tamala Julian 48 hours later (for unclear reasons) who agreed with plan. He then developed severe weakness and leg cramps and both lasix and spiro were held. Follow up BMET showed his creat went up from 1.45 to 2.39. He was instructed to continue holding lasix and spiro until creat stabilized. Creat went back to 1.75 on 02/11/17 and he was restarted on aldactone 25mg  daily.  Today he presents to clinic for follow up. He has been feeling pretty good. The only time he gets SOB is with exertion. He  has a little swelling in his ankles. No chest pain. No orthopnea or PND. No dizziness or syncope. No blood in stool or urine. No palpitations. No exertional chest pain but still with SOB.     Past Medical History:  Diagnosis Date  . Arthritis   . Azotemia   . Cancer [facility]) 2010   prostate  . Chondromalacia   . Coronary artery disease   . Diabetes mellitus without complication ([place])   . GERD (gastroesophageal reflux disease)   . Glaucoma    "high normal"  . Gout   . H/O blood clots    R Lower leg, right upper leg  . Heel spur   . Hyperlipidemia   . Hypertension   . Joint pain   . Lesion of left native kidney   . Low back pain    "severe"  . Mild aortic stenosis   . Myocardial infarction    "told me I had a small heart attack"  . Obesity   . Osteopenia   . Osteoporosis   . Polymyalgia ([place])    h/o  . Renal cyst, left   . Shingles    LEFT ABDOMEN  . Spinal stenosis   . Thrombophlebitis   . Tinnitus   . Tubular adenoma     Past Surgical History:  Procedure Laterality Date  . APPENDECTOMY  60 years ago  . CARDIAC CATHETERIZATION    .  CARDIAC CATHETERIZATION N/A 11/03/2016   Procedure: Right/Left Heart Cath and Coronary/Graft Angiography;  Surgeon: Belva Crome, MD;  Location: [place] [facility];  Service: Cardiovascular;  Laterality: N/A;  . CORONARY ARTERY BYPASS GRAFT  2007  . PROSTATE SURGERY     seed implant  . ROBOTIC ASSITED PARTIAL NEPHRECTOMY Left 08/31/2013   Procedure: ROBOTIC ASSITED PARTIAL NEPHRECTOMY;  Surgeon: Dutch Gray, MD;  Location: [facility];  Service: Urology;  Laterality: Left;  Marland Kitchen VEIN LIGATION AND STRIPPING Right 1972   right lower leg    Current Medications: Outpatient Medications Prior to Visit  Medication Sig Dispense Refill  . alendronate (FOSAMAX) 70 MG tablet Take 70 mg by mouth once a week. Take with a full glass of water on an empty stomach.    Marland Kitchen allopurinol (ZYLOPRIM) 300 MG tablet Take 300 mg by mouth daily.  2  . aspirin 81 MG  chewable tablet Chew 81 mg by mouth daily.    . Calcium Carbonate-Vitamin D 600-200 MG-UNIT TABS Take 1 tablet by mouth daily.    . carvedilol (COREG) 3.125 MG tablet Take 1 tablet (3.125 mg total) by mouth 2 (two) times daily. 60 tablet 3  . Cholecalciferol (VITAMIN D) 2000 units CAPS Take 1 capsule by mouth daily.    Marland Kitchen glipiZIDE (GLUCOTROL) 10 MG tablet Take 10 mg by mouth 2 (two) times daily before a meal.   2  . HYDROcodone-acetaminophen (NORCO) 10-325 MG tablet Take 1-2 tablets by mouth every 6 (six) hours as needed for moderate pain. 10 tablet 0  . indomethacin (INDOCIN) 50 MG capsule Take 50 mg by mouth as needed for mild pain or moderate pain.    . methocarbamol (ROBAXIN) 500 MG tablet Take 500 mg by mouth every 6 (six) hours as needed for muscle spasms.    . Multiple Vitamin (MULTIVITAMIN) tablet Take 1 tablet by mouth daily.    . predniSONE (DELTASONE) 10 MG tablet Take 10 mg by mouth daily with breakfast.    . rosuvastatin (CRESTOR) 10 MG tablet Take 10 mg by mouth daily.    Marland Kitchen spironolactone (ALDACTONE) 25 MG tablet Take 1 tablet (25 mg total) by mouth daily. 90 tablet 0  . tamsulosin (FLOMAX) 0.4 MG CAPS capsule Take 0.4 mg by mouth daily after supper.    . vitamin B-12 (CYANOCOBALAMIN) 1000 MCG tablet Take 1,000 mcg by mouth daily.     No facility-administered medications prior to visit.      Allergies:   Adacel [diphth-acell pertussis-tetanus]; Flexeril [cyclobenzaprine]; Lipitor [atorvastatin]; Pantoprazole; Penicillins; Septra [sulfamethoxazole-trimethoprim]; and Simvastatin   Social History   Social History  . Marital status: Married    Spouse name: N/A  . Number of children: N/A  . Years of education: N/A   Social History Main Topics  . Smoking status: Former Smoker    Packs/day: 3.00    Years: 30.00    Types: Cigarettes    Quit date: 12/21/1980  . Smokeless tobacco: Never Used  . Alcohol use No  . Drug use: No  . Sexual activity: Not Asked   Other Topics  Concern  . None   Social History Narrative  . None     Family History:  The patient's *family history includes Heart Problems in his father and mother.      ROS:   Please see the history of present illness.    ROS All other systems reviewed and are negative.   PHYSICAL EXAM:   VS:  BP 130/64   Pulse  71   Ht 5\' 6"  (1.676 m)   Wt 197 lb (89.4 kg)   SpO2 97%   BMI 31.80 kg/m    GEN: Well nourished, well developed, in no acute distress  HEENT: normal  Neck: no JVD, carotid bruits, or masses Cardiac: RRR; + murmur, rubs, or gallops,no edema  Respiratory:  clear to auscultation bilaterally, normal work of breathing GI: soft, nontender, nondistended, + BS MS: no deformity or atrophy  Skin: warm and dry, no rash Neuro:  Alert and Oriented x 3, Strength and sensation are intact Psych: euthymic mood, full affect    Wt Readings from Last 3 Encounters:  02/24/17 197 lb (89.4 kg)  01/27/17 196 lb 6.4 oz (89.1 kg)  01/25/17 198 lb (89.8 kg)      Studies/Labs Reviewed:   EKG:  EKG is NOT ordered today.    Recent Labs: 10/29/2016: Hemoglobin 12.4; Platelets 221 01/25/2017: NT-Pro BNP 1,209 02/11/2017: BUN 42; Creatinine, Ser 1.75; Potassium 5.0; Sodium 138   Lipid Panel No results found for: CHOL, TRIG, HDL, CHOLHDL, VLDL, LDLCALC, LDLDIRECT  Additional studies/ records that were reviewed today include:  2D ECHO: 09/29/2016 LV EF: 20% - 25% Study Conclusions - Left ventricle: The cavity size was mildly dilated. Wall thickness was increased in a pattern of mild LVH. Systolic function was severely reduced. The estimated ejection fraction was in the range of 20% to 25%. Severe hypokinesis of the apicalanteroseptal, inferior, and apical myocardium. Doppler parameters are consistent with abnormal left ventricular relaxation (grade 1 diastolic dysfunction). - Aortic valve: There was mild stenosis. Valve area (VTI): 1.14 cm^2. Valve area (Vmax): 1.27 cm^2.  Valve area (Vmean): 0.96 cm^2. - Mitral valve: Moderately calcified annulus. There was mild regurgitation.   Nuclear stress test 10/22/16 Study Highlights    Nuclear stress EF: 23%.  There was no ST segment deviation noted during stress.  Defect 1: There is a small defect of mild severity present in the basal inferior location.  This is a high risk study.  High risk study due to severely depressed left ventricular systolic function. No reversible ischemia is seen.  There is a small fixed inferior defect that is likely due to diaphragmatic attenuation, although cannot exclude a small infarction.. Study suggestive of nonischemic cardiomyopathy, but cannot exclude multivessel CAD with "balanced ischemia".     11/03/16 Right/Left Heart Cath and Coronary/Graft Angiography  Conclusion   Severe native vessel coronary disease with total occlusion of the left main and native right coronary.  Bypass graft failure with occlusion of the saphenous vein graft to the ramus intermedius.  Widely patent saphenous vein graft to the PDA and PL branch.  Widely patent LIMA to the LAD  Normal pulmonary artery pressures including capillary wedge.      ASSESSMENT & PLAN:   SOB: this has actually marginally improved. Will just keep him on spiro 25mg  daily.   Chronic systolic CHF: appears euvolemic. BNP was elevated at last visit but lasix caused worsening LE edema, weakness and leg cramps as well as AKI. Will add this to his allergy list. Will continue spiro and coreg. He does not tolerate hydralazine/nitrates. No ACE/ARB due to CKD.   CAD s/p CABG: stable. No chest pain. cath on 11/03/16 with failure of SVG--> RI. Continue ASA and statin.   HTN: more recently has had hypotension. We are trying to add medications to treat his CHF. Tolerating spiro 25mg  daily and Coreg 3.25mg  BID  HLD: continue statin   CKD/RCC s/p partial nephrectomy: will  check a BMET today  DMT2:  continue current regimen     Medication Adjustments/Labs and Tests Ordered: Current medicines are reviewed at length with the patient today.  Concerns regarding medicines are outlined above.  Medication changes, Labs and Tests ordered today are listed in the Patient Instructions below. Patient Instructions  Medication Instructions:  Your physician recommends that you continue on your current medications as directed. Please refer to the Current Medication list given to you today.   Labwork: TODAY:  BMET   Testing/Procedures: Your physician has requested that you have an echocardiogram. Echocardiography is a painless test that uses sound waves to create images of your heart. It provides your doctor with information about the size and shape of your heart and how well your heart's chambers and valves are working. This procedure takes approximately one hour. There are no restrictions for this procedure.    Follow-Up: Your physician recommends that you schedule a follow-up appointment in: [place]   Any Other Special Instructions Will Be Listed Below (If Applicable). Echocardiogram An echocardiogram, or echocardiography, uses sound waves (ultrasound) to produce an image of your heart. The echocardiogram is simple, painless, obtained within a short period of time, and offers valuable information to your health care provider. The images from an echocardiogram can provide information such as:  Evidence of coronary artery disease (CAD).  Heart size.  Heart muscle function.  Heart valve function.  Aneurysm detection.  Evidence of a past heart attack.  Fluid buildup around the heart.  Heart muscle thickening.  Assess heart valve function. Tell a health care provider about:  Any allergies you have.  All medicines you are taking, including vitamins, herbs, eye drops, creams, and over-the-counter medicines.  Any problems you or family members have had with  anesthetic medicines.  Any blood disorders you have.  Any surgeries you have had.  Any medical conditions you have.  Whether you are pregnant or may be pregnant. What happens before the procedure? No special preparation is needed. Eat and drink normally. What happens during the procedure?  In order to produce an image of your heart, gel will be applied to your chest and a wand-like tool (transducer) will be moved over your chest. The gel will help transmit the sound waves from the transducer. The sound waves will harmlessly bounce off your heart to allow the heart images to be captured in real-time motion. These images will then be recorded.  You may need an IV to receive a medicine that improves the quality of the pictures. What happens after the procedure? You may return to your normal schedule including diet, activities, and medicines, unless your health care provider tells you otherwise. This information is not intended to replace advice given to you by your health care provider. Make sure you discuss any questions you have with your health care provider. Document Released: 12/04/2000 Document Revised: 07/25/2016 Document Reviewed: 08/14/2013 Elsevier Interactive Patient Education  2017 [employer].     If you need a refill on your cardiac medications before your next appointment, please call your pharmacy.      Signed, Angelena Form, PA-C  02/24/2017 10:26 AM    [facility] [place], [place], [place]  [address] Phone: [phone]; Fax: [phone]

## 2017-02-24 ENCOUNTER — Ambulatory Visit: Payer: PPO | Admitting: Interventional Cardiology

## 2017-02-24 ENCOUNTER — Ambulatory Visit (INDEPENDENT_AMBULATORY_CARE_PROVIDER_SITE_OTHER): Payer: PPO | Admitting: Physician Assistant

## 2017-02-24 ENCOUNTER — Encounter: Payer: Self-pay | Admitting: Physician Assistant

## 2017-02-24 VITALS — BP 130/64 | HR 71 | Ht 66.0 in | Wt 197.0 lb

## 2017-02-24 DIAGNOSIS — I25119 Atherosclerotic heart disease of native coronary artery with unspecified angina pectoris: Secondary | ICD-10-CM | POA: Diagnosis not present

## 2017-02-24 DIAGNOSIS — E118 Type 2 diabetes mellitus with unspecified complications: Secondary | ICD-10-CM | POA: Diagnosis not present

## 2017-02-24 DIAGNOSIS — R0602 Shortness of breath: Secondary | ICD-10-CM

## 2017-02-24 DIAGNOSIS — I10 Essential (primary) hypertension: Secondary | ICD-10-CM | POA: Diagnosis not present

## 2017-02-24 DIAGNOSIS — I5022 Chronic systolic (congestive) heart failure: Secondary | ICD-10-CM

## 2017-02-24 DIAGNOSIS — N189 Chronic kidney disease, unspecified: Secondary | ICD-10-CM

## 2017-02-24 DIAGNOSIS — E785 Hyperlipidemia, unspecified: Secondary | ICD-10-CM | POA: Diagnosis not present

## 2017-02-24 LAB — BASIC METABOLIC PANEL
BUN / CREAT RATIO: 16 (ref 10–24)
BUN: 25 mg/dL (ref 8–27)
CO2: 23 mmol/L (ref 18–29)
CREATININE: 1.52 mg/dL — AB (ref 0.76–1.27)
Calcium: 10.3 mg/dL — ABNORMAL HIGH (ref 8.6–10.2)
Chloride: 102 mmol/L (ref 96–106)
GFR, EST AFRICAN AMERICAN: 49 mL/min/{1.73_m2} — AB (ref >59)
GFR, EST NON AFRICAN AMERICAN: 43 mL/min/{1.73_m2} — AB (ref >59)
Glucose: 87 mg/dL (ref 65–99)
Potassium: 5.3 mmol/L — ABNORMAL HIGH (ref 3.5–5.2)
Sodium: 143 mmol/L (ref 134–144)

## 2017-02-24 NOTE — Patient Instructions (Signed)
Medication Instructions:  Your physician recommends that you continue on your current medications as directed. Please refer to the Current Medication list given to you today.   Labwork: TODAY:  BMET   Testing/Procedures: Your physician has requested that you have an echocardiogram. Echocardiography is a painless test that uses sound waves to create images of your heart. It provides your doctor with information about the size and shape of your heart and how well your heart's chambers and valves are working. This procedure takes approximately one hour. There are no restrictions for this procedure.    Follow-Up: Your physician recommends that you schedule a follow-up appointment in: [place]   Any Other Special Instructions Will Be Listed Below (If Applicable). Echocardiogram An echocardiogram, or echocardiography, uses sound waves (ultrasound) to produce an image of your heart. The echocardiogram is simple, painless, obtained within a short period of time, and offers valuable information to your health care provider. The images from an echocardiogram can provide information such as:  Evidence of coronary artery disease (CAD).  Heart size.  Heart muscle function.  Heart valve function.  Aneurysm detection.  Evidence of a past heart attack.  Fluid buildup around the heart.  Heart muscle thickening.  Assess heart valve function. Tell a health care provider about:  Any allergies you have.  All medicines you are taking, including vitamins, herbs, eye drops, creams, and over-the-counter medicines.  Any problems you or family members have had with anesthetic medicines.  Any blood disorders you have.  Any surgeries you have had.  Any medical conditions you have.  Whether you are pregnant or may be pregnant. What happens before the procedure? No special preparation is needed. Eat and drink normally. What happens during the procedure?  In order to produce an  image of your heart, gel will be applied to your chest and a wand-like tool (transducer) will be moved over your chest. The gel will help transmit the sound waves from the transducer. The sound waves will harmlessly bounce off your heart to allow the heart images to be captured in real-time motion. These images will then be recorded.  You may need an IV to receive a medicine that improves the quality of the pictures. What happens after the procedure? You may return to your normal schedule including diet, activities, and medicines, unless your health care provider tells you otherwise. This information is not intended to replace advice given to you by your health care provider. Make sure you discuss any questions you have with your health care provider. Document Released: 12/04/2000 Document Revised: 07/25/2016 Document Reviewed: 08/14/2013 Elsevier Interactive Patient Education  2017 [employer].     If you need a refill on your cardiac medications before your next appointment, please call your pharmacy.

## 2017-02-25 ENCOUNTER — Other Ambulatory Visit: Payer: PPO

## 2017-02-25 ENCOUNTER — Telehealth: Payer: Self-pay | Admitting: *Deleted

## 2017-02-25 DIAGNOSIS — E875 Hyperkalemia: Secondary | ICD-10-CM

## 2017-02-25 NOTE — Telephone Encounter (Signed)
-----   Message from Eileen Stanford, PA-C sent at 02/24/2017  8:23 PM EST ----- Creat better but now potassium a little high. Lets check BMET again on Friday to make sure it is better.

## 2017-02-26 ENCOUNTER — Other Ambulatory Visit: Payer: PPO | Admitting: *Deleted

## 2017-02-26 DIAGNOSIS — E875 Hyperkalemia: Secondary | ICD-10-CM

## 2017-02-28 LAB — BASIC METABOLIC PANEL
BUN / CREAT RATIO: 15 (ref 10–24)
BUN: 28 mg/dL — AB (ref 8–27)
CO2: 27 mmol/L (ref 18–29)
CREATININE: 1.85 mg/dL — AB (ref 0.76–1.27)
Calcium: 9.8 mg/dL (ref 8.6–10.2)
Chloride: 100 mmol/L (ref 96–106)
GFR, EST AFRICAN AMERICAN: 39 mL/min/{1.73_m2} — AB (ref >59)
GFR, EST NON AFRICAN AMERICAN: 34 mL/min/{1.73_m2} — AB (ref >59)
Glucose: 104 mg/dL — ABNORMAL HIGH (ref 65–99)
Potassium: 4.2 mmol/L (ref 3.5–5.2)
Sodium: 143 mmol/L (ref 134–144)

## 2017-03-02 ENCOUNTER — Other Ambulatory Visit: Payer: PPO

## 2017-03-02 DIAGNOSIS — M47816 Spondylosis without myelopathy or radiculopathy, lumbar region: Secondary | ICD-10-CM | POA: Diagnosis not present

## 2017-03-02 DIAGNOSIS — M461 Sacroiliitis, not elsewhere classified: Secondary | ICD-10-CM | POA: Diagnosis not present

## 2017-03-08 DIAGNOSIS — H4311 Vitreous hemorrhage, right eye: Secondary | ICD-10-CM | POA: Diagnosis not present

## 2017-03-09 DIAGNOSIS — H43811 Vitreous degeneration, right eye: Secondary | ICD-10-CM | POA: Diagnosis not present

## 2017-03-09 DIAGNOSIS — E119 Type 2 diabetes mellitus without complications: Secondary | ICD-10-CM | POA: Diagnosis not present

## 2017-03-10 DIAGNOSIS — E1142 Type 2 diabetes mellitus with diabetic polyneuropathy: Secondary | ICD-10-CM | POA: Diagnosis not present

## 2017-03-10 DIAGNOSIS — G8929 Other chronic pain: Secondary | ICD-10-CM | POA: Diagnosis not present

## 2017-03-10 DIAGNOSIS — Z7984 Long term (current) use of oral hypoglycemic drugs: Secondary | ICD-10-CM | POA: Diagnosis not present

## 2017-03-10 DIAGNOSIS — N62 Hypertrophy of breast: Secondary | ICD-10-CM | POA: Diagnosis not present

## 2017-03-10 DIAGNOSIS — M545 Low back pain: Secondary | ICD-10-CM | POA: Diagnosis not present

## 2017-03-10 DIAGNOSIS — I5022 Chronic systolic (congestive) heart failure: Secondary | ICD-10-CM | POA: Diagnosis not present

## 2017-03-10 DIAGNOSIS — E1121 Type 2 diabetes mellitus with diabetic nephropathy: Secondary | ICD-10-CM | POA: Diagnosis not present

## 2017-03-22 DIAGNOSIS — M48061 Spinal stenosis, lumbar region without neurogenic claudication: Secondary | ICD-10-CM | POA: Diagnosis not present

## 2017-03-22 DIAGNOSIS — M47816 Spondylosis without myelopathy or radiculopathy, lumbar region: Secondary | ICD-10-CM | POA: Diagnosis not present

## 2017-03-22 DIAGNOSIS — M419 Scoliosis, unspecified: Secondary | ICD-10-CM | POA: Diagnosis not present

## 2017-03-23 ENCOUNTER — Ambulatory Visit
Admission: RE | Admit: 2017-03-23 | Discharge: 2017-03-23 | Disposition: A | Discharge status: Home / Self Care | Payer: PPO | Source: Ambulatory Visit | Attending: Neurosurgery | Admitting: Neurosurgery

## 2017-03-23 ENCOUNTER — Other Ambulatory Visit: Payer: Self-pay | Admitting: Neurosurgery

## 2017-03-23 DIAGNOSIS — M48061 Spinal stenosis, lumbar region without neurogenic claudication: Secondary | ICD-10-CM

## 2017-03-23 DIAGNOSIS — M545 Low back pain: Secondary | ICD-10-CM | POA: Diagnosis not present

## 2017-03-24 DIAGNOSIS — M47816 Spondylosis without myelopathy or radiculopathy, lumbar region: Secondary | ICD-10-CM | POA: Diagnosis not present

## 2017-03-24 DIAGNOSIS — M419 Scoliosis, unspecified: Secondary | ICD-10-CM | POA: Diagnosis not present

## 2017-03-24 DIAGNOSIS — M48061 Spinal stenosis, lumbar region without neurogenic claudication: Secondary | ICD-10-CM | POA: Diagnosis not present

## 2017-03-29 ENCOUNTER — Ambulatory Visit (HOSPITAL_COMMUNITY): Payer: PPO | Attending: Cardiovascular Disease

## 2017-03-29 ENCOUNTER — Other Ambulatory Visit: Payer: Self-pay

## 2017-03-29 DIAGNOSIS — I080 Rheumatic disorders of both mitral and aortic valves: Secondary | ICD-10-CM | POA: Diagnosis not present

## 2017-03-29 DIAGNOSIS — I5022 Chronic systolic (congestive) heart failure: Secondary | ICD-10-CM | POA: Diagnosis not present

## 2017-03-29 MED ORDER — PERFLUTREN LIPID MICROSPHERE
1.0000 mL | INTRAVENOUS | Status: AC | PRN
  Administered 2017-03-29: 2 mL via INTRAVENOUS

## 2017-03-30 ENCOUNTER — Telehealth: Payer: Self-pay | Admitting: *Deleted

## 2017-03-30 DIAGNOSIS — I5021 Acute systolic (congestive) heart failure: Secondary | ICD-10-CM

## 2017-03-30 NOTE — Telephone Encounter (Signed)
-----   Message from Belva Crome, MD sent at 03/30/2017  1:09 PM EDT ----- Let the patient know the echocardiogram reveals continued reduction in LV function and possible contribution from aortic valve stenosis. Please schedule a dobutamine echo to rule out low gradient aortic stenosis. If confirmed, may need to consider TAVR. A copy will be sent to Mathews Argyle, MD

## 2017-03-30 NOTE — Telephone Encounter (Signed)
Spoke with pt and went over echo results and recommendations per Dr. Tamala Julian. Went over instructions for stress echo.  Pt verbalized understanding and was in agreement with this plan.

## 2017-04-02 ENCOUNTER — Telehealth (HOSPITAL_COMMUNITY): Payer: Self-pay | Admitting: Interventional Cardiology

## 2017-04-06 NOTE — Telephone Encounter (Signed)
04/02/2017 12:40 PM Phone (Outgoing) Bryley, Kovacevic (Self) [phone] (H)   Left Message - Called pt and lmsg for him to CB to schedule stress echo.    By Verdene Rio

## 2017-04-14 ENCOUNTER — Telehealth: Payer: Self-pay | Admitting: Interventional Cardiology

## 2017-04-14 DIAGNOSIS — Z79899 Other long term (current) drug therapy: Secondary | ICD-10-CM

## 2017-04-14 MED ORDER — SPIRONOLACTONE 25 MG PO TABS: 25.0000 mg | ORAL_TABLET | Freq: Two times a day (BID) | ORAL | 3 refills | Status: DC

## 2017-04-14 NOTE — Telephone Encounter (Signed)
Left message for patient to call back  

## 2017-04-14 NOTE — Telephone Encounter (Signed)
Called patient and he states that he has stopped taking the prednisone and that he has been taking spironolactone 25 mg QD, but he is still having swelling in his feet and legs. Patient denies having any weight gain or SOB. Patient states that he only becomes SOB when he over exerts himself. Patient states that he has been keeping his legs elevated at night. Patient states that he has been avoiding salt in his diet. Patient made aware that the message would be sent to Dr. Tamala Julian for review and recommendation. Patient verbalized understanding.

## 2017-04-14 NOTE — Telephone Encounter (Signed)
Patient calling back. Patient made aware of Dr. Thompson Caul recommendations to increase spironolactone 25 mg BID and to come back for a BMET. Patient states that he cannot come on Friday but he can on Monday. BMET ordered and appointment made for Monday April 30th. Patient verbalized understanding.

## 2017-04-14 NOTE — Telephone Encounter (Signed)
Patient wife calling, states that patient has stopped taking the prednisone medication. Patient is taking the fluid medication prescribed by Dr.Smith but still has swelling in his feet and legs. Mrs. Odenthal would like to know what patient should do about the swelling. Thanks.

## 2017-04-14 NOTE — Telephone Encounter (Signed)
Increase spironolactone to BID and repeat BMET Friday and Monday.

## 2017-04-19 ENCOUNTER — Other Ambulatory Visit: Payer: Self-pay | Admitting: Physician Assistant

## 2017-04-19 ENCOUNTER — Other Ambulatory Visit: Payer: PPO | Admitting: *Deleted

## 2017-04-19 ENCOUNTER — Other Ambulatory Visit: Payer: Self-pay | Admitting: *Deleted

## 2017-04-19 DIAGNOSIS — Z79899 Other long term (current) drug therapy: Secondary | ICD-10-CM | POA: Diagnosis not present

## 2017-04-19 LAB — BASIC METABOLIC PANEL
BUN/Creatinine Ratio: 14 (ref 10–24)
BUN: 30 mg/dL — AB (ref 8–27)
CALCIUM: 9.9 mg/dL (ref 8.6–10.2)
CO2: 21 mmol/L (ref 18–29)
Chloride: 103 mmol/L (ref 96–106)
Creatinine, Ser: 2.21 mg/dL — ABNORMAL HIGH (ref 0.76–1.27)
GFR calc Af Amer: 31 mL/min/{1.73_m2} — ABNORMAL LOW (ref >59)
GFR, EST NON AFRICAN AMERICAN: 27 mL/min/{1.73_m2} — AB (ref >59)
GLUCOSE: 105 mg/dL — AB (ref 65–99)
Potassium: 4.4 mmol/L (ref 3.5–5.2)
Sodium: 142 mmol/L (ref 134–144)

## 2017-04-19 MED ORDER — SPIRONOLACTONE 25 MG PO TABS: 25.0000 mg | ORAL_TABLET | Freq: Every day | ORAL | 3 refills | Status: DC

## 2017-04-22 ENCOUNTER — Telehealth: Payer: Self-pay | Admitting: Interventional Cardiology

## 2017-04-22 ENCOUNTER — Telehealth (HOSPITAL_COMMUNITY): Payer: Self-pay | Admitting: *Deleted

## 2017-04-22 MED ORDER — EPLERENONE 25 MG PO TABS: 25.0000 mg | ORAL_TABLET | Freq: Every day | ORAL | 3 refills | Status: DC

## 2017-04-22 NOTE — Telephone Encounter (Signed)
°  New Message ° ° pt verbalized that he is calling for rn  °

## 2017-04-22 NOTE — Telephone Encounter (Signed)
Patient given detailed instructions per Stress Test Requisition Sheet for test on 04/26/17 at 2:30.Patient Notified to arrive 30 minutes early, and that it is imperative to arrive on time for appointment to keep from having the test rescheduled.  Patient verbalized understanding. Veronia Beets

## 2017-04-22 NOTE — Telephone Encounter (Signed)
Spoke with pt and he was calling to see if we received the letter he dropped off for Dr. Tamala Julian to review.  Advised pt that Dr. Tamala Julian had just reviewed it and most of his complaints on there were known and that's why we set him up to see the CHF clinic.  Advised pt that Dr. Tamala Julian did recommend d/c'ing Spironolactone and starting Eplerenone 25mg  once daily. Advised pt that Meloxicam could be worsening his swelling.  Pt states that he has not taken that since last Friday.  Advised pt to try this new medication and if no improvement to please contact our office.  Pt verbalized understanding and was appreciative for call.

## 2017-04-26 ENCOUNTER — Ambulatory Visit (HOSPITAL_COMMUNITY): Payer: PPO

## 2017-04-26 ENCOUNTER — Ambulatory Visit (HOSPITAL_COMMUNITY): Payer: PPO | Attending: Cardiology

## 2017-04-26 ENCOUNTER — Other Ambulatory Visit: Payer: Self-pay

## 2017-04-26 DIAGNOSIS — I350 Nonrheumatic aortic (valve) stenosis: Secondary | ICD-10-CM | POA: Insufficient documentation

## 2017-04-26 DIAGNOSIS — I5021 Acute systolic (congestive) heart failure: Secondary | ICD-10-CM

## 2017-04-26 DIAGNOSIS — I517 Cardiomegaly: Secondary | ICD-10-CM | POA: Insufficient documentation

## 2017-04-26 LAB — ECHOCARDIOGRAM STRESS TEST
AV Area VTI: 0.85 cm2
AV Area mean vel: 0.85 cm2
AV VEL mean LVOT/AV: 0.22
AV area mean vel ind: 0.41 cm2/m2
AV pk vel: 358 cm/s
AV vel: 0.99
AVA: 0.99 cm2
AVAREAVTIIND: 0.48 cm2/m2
AVG: 28 mmHg
AVPG: 51 mmHg
Ao pk vel: 0.22 m/s
CHL CUP AV PEAK INDEX: 0.41
CHL CUP AV VALUE AREA INDEX: 0.48
CHL CUP DOP CALC LVOT VTI: 13.1 cm
DOP CAL AO MEAN VELOCITY: 252 cm/s
FS: 20 % — AB (ref 28–44)
IVS/LV PW RATIO, ED: 1.02
LA ID, A-P, ES: 46 mm
LA diam index: 2.22 cm/m2
LDCA: 3.8 cm2
LEFT ATRIUM END SYS DIAM: 46 mm
LV PW d: 11.9 mm — AB (ref 0.6–1.1)
LVOT SV: 50 mL
LVOT diameter: 22 mm
LVOT peak VTI: 0.26 cm
LVOT peak grad rest: 3 mmHg
LVOTPV: 79.8 cm/s
VTI: 50.5 cm

## 2017-04-26 MED ORDER — SODIUM CHLORIDE 0.9 % IV SOLN
10.0000 ug/kg/min | INTRAVENOUS | Status: AC
  Administered 2017-04-26: 20 ug/kg/min via INTRAVENOUS

## 2017-05-03 ENCOUNTER — Telehealth: Payer: Self-pay | Admitting: Interventional Cardiology

## 2017-05-03 DIAGNOSIS — I5021 Acute systolic (congestive) heart failure: Secondary | ICD-10-CM

## 2017-05-03 NOTE — Telephone Encounter (Signed)
Start Demadex 20 mg daily. Bmet in one week.

## 2017-05-03 NOTE — Telephone Encounter (Signed)
Received call from pt who states he has been experiencing SOB for the past 3 weeks and it has gotten much worse in the week. He states from just standing up and walking to the bathroom, he gets extremely SOB. He is aware that his ejection fraction is 20-25% and that his heart has weakened. He states in the past 3-4 days that drinking coffee and juice upsets his stomach. He has a lot of swelling mostly in his feet, ankles, lower legs, and hands. He states that his feet look like "balloons with 5 fingers sticking out". Advised patient to keep appointment with [facility] on 5/29. Pt is aware and verbalized understanding. He states the last fluid pill he was put on (Eplerenone) has made his symptoms much worse than the last pill. Advised patient I would forward message to Dr. Tamala Julian for further review. Pt thanked me for my call. Made pt aware that Dr. Thompson Caul nurse is aware of his symptoms.

## 2017-05-03 NOTE — Telephone Encounter (Signed)
Pt c/o swelling: STAT is pt has developed SOB within 24 hours  1. How long have you been experiencing swelling? About 1 week , it's been up and down  2. Where is the swelling located? Both legs, feet, hands  3.  Are you currently taking a "fluid pill"? Yes   4.  Are you currently SOB? Yes and fatigue  5.  Have you traveled recently? No

## 2017-05-05 MED ORDER — TORSEMIDE 20 MG PO TABS: 20.0000 mg | ORAL_TABLET | Freq: Once | ORAL | 11 refills | Status: DC

## 2017-05-05 NOTE — Telephone Encounter (Signed)
Spoke with pt and went over recommendations per Dr. Tamala Julian.  Clarified with Dr. Tamala Julian about Epleronone as well.  Dr. Tamala Julian wants him to continue Eplernone and add Demadex.  Pt agreeable to come 5/24 for labs.  Pt made aware to monitor BP and call us if it drops too low.  Pt appreciative for call.

## 2017-05-05 NOTE — Addendum Note (Signed)
Addended by: Loren Racer on: 05/05/2017 01:15 PM   Modules accepted: Orders

## 2017-05-10 ENCOUNTER — Telehealth: Payer: Self-pay | Admitting: Interventional Cardiology

## 2017-05-10 MED ORDER — TORSEMIDE 20 MG PO TABS: ORAL_TABLET | ORAL | 3 refills | Status: DC

## 2017-05-10 MED ORDER — EPLERENONE 25 MG PO TABS: 25.0000 mg | ORAL_TABLET | ORAL | 3 refills | Status: DC

## 2017-05-10 NOTE — Telephone Encounter (Signed)
Spoke with pt and went over recommendations per Dr. Smith.  Pt verbalized understanding and was in agreement with this plan.   

## 2017-05-10 NOTE — Telephone Encounter (Signed)
Spoke with pt and he states that he has not been back to work since last Thurs d/t fatigue and weakness.  Pt is afraid this is from the torsemide and eplerenone.  States swelling is much better but he has been shaky and nervous.  BP has been 100-110/65-75, HR 70's.  States he is occasionally dizzy when he first stands up and just has to take his time to get going.  Advised I would send message to Dr. Tamala Julian for review and advisement.

## 2017-05-10 NOTE — Telephone Encounter (Signed)
Please have him to change Epleranone known to Monday, Wednesday, and Friday and decrease torsemide to 20 mg on Tuesday, Thursday, and Saturday.

## 2017-05-10 NOTE — Telephone Encounter (Signed)
Follow Up:    Returning your call from 05-05-17.

## 2017-05-13 ENCOUNTER — Other Ambulatory Visit: Payer: PPO | Admitting: *Deleted

## 2017-05-13 DIAGNOSIS — I5021 Acute systolic (congestive) heart failure: Secondary | ICD-10-CM | POA: Diagnosis not present

## 2017-05-14 ENCOUNTER — Telehealth: Payer: Self-pay | Admitting: Interventional Cardiology

## 2017-05-14 LAB — BASIC METABOLIC PANEL
BUN/Creatinine Ratio: 24 (ref 10–24)
BUN: 56 mg/dL — AB (ref 8–27)
CO2: 25 mmol/L (ref 18–29)
CREATININE: 2.29 mg/dL — AB (ref 0.76–1.27)
Calcium: 11.6 mg/dL — ABNORMAL HIGH (ref 8.6–10.2)
Chloride: 98 mmol/L (ref 96–106)
GFR calc Af Amer: 30 mL/min/{1.73_m2} — ABNORMAL LOW (ref >59)
GFR calc non Af Amer: 26 mL/min/{1.73_m2} — ABNORMAL LOW (ref >59)
GLUCOSE: 131 mg/dL — AB (ref 65–99)
Potassium: 4.7 mmol/L (ref 3.5–5.2)
Sodium: 140 mmol/L (ref 134–144)

## 2017-05-14 NOTE — Telephone Encounter (Signed)
I agree with this recommendation.

## 2017-05-14 NOTE — Telephone Encounter (Signed)
Patient wife calling, states that patient is constantly fatigue and has no energy. Patient wife states that his memory is not the same since taking eplerenone and torsemide medications together. Please call to discuss, thanks.

## 2017-05-14 NOTE — Telephone Encounter (Signed)
Spoke with pt and he states that he is still fatigued and has no energy.  Eplerenone was changed to every MWF and Torsemide TThSa on 05/10/17.  Pt states fatigue and energy seem to get a little better everyday but is still more fatigued then he would like to be.  Pt has appt in HF in Tuesday.  Advised medication change was recent so it may take a little more time to get energy level built back up. Advised pt to continue monitoring sx and update HF clinic when he sees them Tuesday.  Will route to Dr. Tamala Julian to see if he has further recommendations.

## 2017-05-18 ENCOUNTER — Ambulatory Visit (HOSPITAL_COMMUNITY)
Admission: RE | Admit: 2017-05-18 | Discharge: 2017-05-18 | Disposition: A | Discharge status: Home / Self Care | Payer: PPO | Source: Ambulatory Visit | Attending: Cardiology | Admitting: Cardiology

## 2017-05-18 ENCOUNTER — Encounter (HOSPITAL_COMMUNITY): Payer: Self-pay

## 2017-05-18 VITALS — BP 121/67 | HR 75 | Wt 189.5 lb

## 2017-05-18 DIAGNOSIS — Z7984 Long term (current) use of oral hypoglycemic drugs: Secondary | ICD-10-CM | POA: Diagnosis not present

## 2017-05-18 DIAGNOSIS — M545 Low back pain: Secondary | ICD-10-CM | POA: Diagnosis not present

## 2017-05-18 DIAGNOSIS — I25810 Atherosclerosis of coronary artery bypass graft(s) without angina pectoris: Secondary | ICD-10-CM

## 2017-05-18 DIAGNOSIS — I130 Hypertensive heart and chronic kidney disease with heart failure and stage 1 through stage 4 chronic kidney disease, or unspecified chronic kidney disease: Secondary | ICD-10-CM | POA: Insufficient documentation

## 2017-05-18 DIAGNOSIS — N183 Chronic kidney disease, stage 3 (moderate): Secondary | ICD-10-CM | POA: Insufficient documentation

## 2017-05-18 DIAGNOSIS — M109 Gout, unspecified: Secondary | ICD-10-CM | POA: Insufficient documentation

## 2017-05-18 DIAGNOSIS — I2510 Atherosclerotic heart disease of native coronary artery without angina pectoris: Secondary | ICD-10-CM | POA: Insufficient documentation

## 2017-05-18 DIAGNOSIS — E785 Hyperlipidemia, unspecified: Secondary | ICD-10-CM | POA: Diagnosis not present

## 2017-05-18 DIAGNOSIS — Z905 Acquired absence of kidney: Secondary | ICD-10-CM | POA: Diagnosis not present

## 2017-05-18 DIAGNOSIS — E1122 Type 2 diabetes mellitus with diabetic chronic kidney disease: Secondary | ICD-10-CM | POA: Insufficient documentation

## 2017-05-18 DIAGNOSIS — N189 Chronic kidney disease, unspecified: Secondary | ICD-10-CM

## 2017-05-18 DIAGNOSIS — I959 Hypotension, unspecified: Secondary | ICD-10-CM | POA: Insufficient documentation

## 2017-05-18 DIAGNOSIS — Z7982 Long term (current) use of aspirin: Secondary | ICD-10-CM | POA: Diagnosis not present

## 2017-05-18 DIAGNOSIS — Z85528 Personal history of other malignant neoplasm of kidney: Secondary | ICD-10-CM | POA: Diagnosis not present

## 2017-05-18 DIAGNOSIS — Z87891 Personal history of nicotine dependence: Secondary | ICD-10-CM | POA: Insufficient documentation

## 2017-05-18 DIAGNOSIS — I5022 Chronic systolic (congestive) heart failure: Secondary | ICD-10-CM | POA: Insufficient documentation

## 2017-05-18 DIAGNOSIS — Z8546 Personal history of malignant neoplasm of prostate: Secondary | ICD-10-CM | POA: Diagnosis not present

## 2017-05-18 DIAGNOSIS — I350 Nonrheumatic aortic (valve) stenosis: Secondary | ICD-10-CM | POA: Insufficient documentation

## 2017-05-18 DIAGNOSIS — Z951 Presence of aortocoronary bypass graft: Secondary | ICD-10-CM | POA: Insufficient documentation

## 2017-05-18 LAB — BASIC METABOLIC PANEL
Anion gap: 9 (ref 5–15)
BUN: 37 mg/dL — AB (ref 6–20)
CHLORIDE: 101 mmol/L (ref 101–111)
CO2: 28 mmol/L (ref 22–32)
Calcium: 10.6 mg/dL — ABNORMAL HIGH (ref 8.9–10.3)
Creatinine, Ser: 1.99 mg/dL — ABNORMAL HIGH (ref 0.61–1.24)
GFR calc Af Amer: 35 mL/min — ABNORMAL LOW (ref >60)
GFR calc non Af Amer: 30 mL/min — ABNORMAL LOW (ref >60)
GLUCOSE: 122 mg/dL — AB (ref 65–99)
POTASSIUM: 4 mmol/L (ref 3.5–5.1)
SODIUM: 138 mmol/L (ref 135–145)

## 2017-05-18 MED ORDER — TORSEMIDE 20 MG PO TABS: 20.0000 mg | ORAL_TABLET | ORAL | 3 refills | Status: DC

## 2017-05-18 NOTE — Patient Instructions (Signed)
Take Torsemide 20 mg every other day   Labs today  Labs in 10 days  You have been referred to Dr Burt Knack   Your physician recommends that you schedule a follow-up appointment in: 3 weeks

## 2017-05-18 NOTE — Progress Notes (Signed)
PCP: Dr. Felipa Eth Cardiology: Dr. Tamala Julian HF Cardiology: Dr. Aundra Dubin  Mr Curfman was referred by Dr. Tamala Julian for evaluation of CHF.   83 yo with history of CAD s/p CABG, chronic systolic CHF, CKD stage 3, and suspected severe aortic stenosis presents for CHF clinic evaluation.  Patient had CABG in 2007.  In 10/17, he had an echo showing EF down to 20-25%.  With fall in EF, he ended up having a right and left heart cath done in 11/17.  The cath showed normal right and left heart filling pressures.  The LIMA-LAD and SVG-PDA were patent but the SVG-ramus was occluded.  The fall in EF was thought to be disproportionate to the coronary disease.   He has had a hard time getting on cardiac meds.  Hydralazine/nitrates were not tolerated due to hypotension.  He has not been on ACEI/ARB/ARNI due to elevated creatinine.  He did not tolerate spironolactone.  Most recently, he was on torsemide, eplerenone, and low dose Coreg.   He felt sleepy/over-sedated and creatinine rose, so he stopped both torsemide and eplerenone.  He feels like torsemide helped his peripheral edema a lot.   His main complaint currently is low back pain.  It is quite severe.  He sees Dr Carloyn Manner, apparently there are not a lot of options at this point.  He is short of breath after walking about 100 yards or walking up a flight of stairs. No orthopnea/PND.  No lightheadedness/syncope. No falls.  No chest pain.   ECG (personally reviewed): NSR, LVH with repolarization abnormality.   Labs (5/18): K 4.7, creatinine 2.29  PMH: 1. Low back pain: Severe, from arthritis.  2. Gout 3. Hyperlipidemia: Myalgias with simvastatin and atorvastatin.  4. Prostate cancer s/p radiation.  5. Renal cell carcinoma s/p left partial nephrectomy.  6. HTN 7. Type II diabetes 8. CAD: s/p CABG in 2007.  - LHC/RHC (11/17): Totally occluded left main and RCA. Totally occluded SVG-ramus.  Patent SVG-PDA and LIMA-LAD.  Mean RA 7, PA 21/4, mean PCWP 6, CI 2.72.  9. CKD:  Stage 3.  10. Chronic systolic CHF: Ischemic cardiomyopathy, though severe AS may play a roll.  - Echo (10/17): EF 20-25%, aortic stenosis reported as mild.  - Echo (4/18): EF 25-30%, low gradient severe AS suspected, mild MR, RV function mildly reduced.  11. Aortic stenosis: Suspect low gradient severe AS.  - Low dose DSE (5/18): EF 20-25%, mean gradient across the aortic valve rose from 25 mmHg => 41 mmHg and AVA remained 0.8 cm^2.    SH: Lives with wife in [facility].  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  He has not smoked in > 30 years.   Family History  Problem Relation Age of Onset  . Heart Problems Mother   . Heart Problems Father    ROS: All systems reviewed and negative except as per HPI.   Current Outpatient Prescriptions  Medication Sig Dispense Refill  . allopurinol (ZYLOPRIM) 300 MG tablet Take 300 mg by mouth daily.  2  . aspirin 81 MG chewable tablet Chew 81 mg by mouth daily.    . Calcium Carbonate-Vitamin D 600-200 MG-UNIT TABS Take 1 tablet by mouth daily.    . carvedilol (COREG) 3.125 MG tablet TAKE 1 TABLET BY MOUTH TWICE DAILY 60 tablet 0  . Cholecalciferol (VITAMIN D) 2000 units CAPS Take 1 capsule by mouth daily.    Marland Kitchen glipiZIDE (GLUCOTROL) 10 MG tablet Take 10 mg by mouth 2 (two) times daily before  a meal.   2  . HYDROcodone-acetaminophen (NORCO) 10-325 MG tablet Take 1-2 tablets by mouth every 6 (six) hours as needed for moderate pain. 10 tablet 0  . Multiple Vitamin (MULTIVITAMIN) tablet Take 1 tablet by mouth daily.    . rosuvastatin (CRESTOR) 10 MG tablet Take 10 mg by mouth daily.    . tamsulosin (FLOMAX) 0.4 MG CAPS capsule Take 0.4 mg by mouth daily after supper.    Marland Kitchen alendronate (FOSAMAX) 70 MG tablet Take 70 mg by mouth once a week. Take with a full glass of water on an empty stomach.    Marland Kitchen eplerenone (INSPRA) 25 MG tablet Take 1 tablet (25 mg total) by mouth every Monday, Wednesday, and Friday. (Patient not taking: Reported on 05/18/2017) 45 tablet  3  . torsemide (DEMADEX) 20 MG tablet Take 1 tablet (20 mg total) by mouth every other day. 45 tablet 3   No current facility-administered medications for this encounter.    BP 121/67   Pulse 75   Wt 189 lb 8 oz (86 kg)   SpO2 99%   BMI 30.59 kg/m  General: NAD Neck: No JVD, no thyromegaly or thyroid nodule.  Lungs: Clear to auscultation bilaterally with normal respiratory effort. CV: Nondisplaced PMI.  Heart regular S1/S2, no S3/S4, 3/6 crescendo-decrescendo murmur RUSB with clear S2.  1+ ankle edema.  Bilateral lower leg varicosities. No carotid bruit.  Normal pedal pulses.  Abdomen: Soft, nontender, no hepatosplenomegaly, no distention.  Skin: Intact without lesions or rashes.  Neurologic: Alert and oriented x 3.  Psych: Normal affect. Extremities: No clubbing or cyanosis.  HEENT: Normal.    Assessment/Plan: 1. CAD: s/p CABG.  Last cath in 11/17 with patent LIMA-LAD, patent SVG-PDA, and occluded SVG-ramus.  No intervention.  Patient has had no chest pain.  - Continue ASA 81 and Crestor.  2. CKD: Stage 3.  Followed by Dr. Justin Mend.  3. Chronic systolic CHF: Ischemic and possibly valvular (low gradient severe AS) cardiomyopathy.  Most recent echo in 4/18 with EF 25-30%.  On exam, he is only mildly volume overloaded.  NYHA class III symptoms but hard to tell for sure as he is actually more limited by his back.  He has tolerated cardiac meds poorly and is only on Coreg currently.  - Continue Coreg 3.125 mg bid. - Hold off on ACEI/ARB/ARNI with elevated creatinine.  - Hold off on hydralazine/Imdur for now, caused hypotension.  - Eventually will restart him on eplerenone.  - Today, start torsemide 20 mg every other day.  BMET today and again in 10 days.  4. Aortic stenosis: Suspect low gradient, severe AS based on DSE from 5/18.  This may actually be part of the reason why he tolerates BP-active meds so poorly.  - I think that he is going to need TAVR.  I will refer him to Dr. Burt Knack for  evaluation.  5. Hyperlipidemia: Continue Crestor.   Followup in 3 wks.   Loralie Champagne 05/19/2017

## 2017-05-19 NOTE — Progress Notes (Signed)
Please arrange for Dr. Burt Knack to see and consider TAVR.

## 2017-05-19 NOTE — Progress Notes (Signed)
Thanks for seeing.  I did not appropriately review the stress ECHO report. Just looked at the [facility] and thought it was a "repeat echo" having forgotten I ordered stress echo through [place]. Agree TAVR is appropriate!!  Sorry to waste your time, but he should be happy I did.

## 2017-05-25 ENCOUNTER — Encounter: Payer: Self-pay | Admitting: Cardiovascular Disease

## 2017-05-26 DIAGNOSIS — M47816 Spondylosis without myelopathy or radiculopathy, lumbar region: Secondary | ICD-10-CM | POA: Diagnosis not present

## 2017-05-28 ENCOUNTER — Other Ambulatory Visit: Payer: Self-pay | Admitting: *Deleted

## 2017-05-28 ENCOUNTER — Encounter: Payer: Self-pay | Admitting: Cardiovascular Disease

## 2017-05-28 ENCOUNTER — Ambulatory Visit (INDEPENDENT_AMBULATORY_CARE_PROVIDER_SITE_OTHER): Payer: PPO | Admitting: Cardiovascular Disease

## 2017-05-28 ENCOUNTER — Other Ambulatory Visit (HOSPITAL_COMMUNITY): Payer: PPO

## 2017-05-28 VITALS — BP 128/64 | HR 74 | Ht 66.5 in | Wt 192.0 lb

## 2017-05-28 DIAGNOSIS — I350 Nonrheumatic aortic (valve) stenosis: Secondary | ICD-10-CM | POA: Diagnosis not present

## 2017-05-28 DIAGNOSIS — I5022 Chronic systolic (congestive) heart failure: Secondary | ICD-10-CM

## 2017-05-28 DIAGNOSIS — N289 Disorder of kidney and ureter, unspecified: Secondary | ICD-10-CM

## 2017-05-28 NOTE — Patient Instructions (Addendum)
Medication Instructions:  Your physician recommends that you continue on your current medications as directed. Please refer to the Current Medication list given to you today.  Labwork: Your physician recommends that you have lab work today: [facility]  Your physician recommends that you return for lab work on 06/09/17 (BMP), this can be done at the CHF clinic appointment  Testing/Procedures: Non-Cardiac CT Angiography (CTA), is a special type of CT scan that uses a computer to produce multi-dimensional views of major blood vessels throughout the body. In CT angiography, a contrast material is injected through an IV to help visualize the blood vessels (CTA Chest/abdomen/pelvis) Please go to Admitting at [facility] (Entrance A, Main Entrance) on 06/03/17 at 10:30 AM.  If this is a day you are due to take Demadex please skip this day and restart medication the day after test (per pt this is not a day Demadex is due)   Your physician has requested that you have cardiac CT. Cardiac computed tomography (CT) is a painless test that uses an x-ray machine to take clear, detailed pictures of your heart. For further information please visit [facility]. Please follow instruction sheet as given. Please go to Admitting at [facility] (Entrance A, Main Entrance) on 06/11/17 at 8:00 AM. If this is a day you are due to take Demadex please skip this day and restart medication the day after test. (Demadex is due this day the pt was advised to take dosage Saturday morning)  Pre CT scan instructions: No solid foods, caffeine or smoking 4 hours prior to CT.  Liquids are okay but no caffeine. No herbal supplements by mouth 24 hours prior to CT. No sexual enhancement drugs 72 hours prior to test.   Your physician has requested that you have a carotid duplex. This test is an ultrasound of the carotid arteries in your neck. It looks at blood flow through these arteries that supply the brain with blood.  Allow one hour for this exam. There are no restrictions or special instructions. This will be done on 06/03/17 at 3:00 PM after you complete your CTA.   Your physician has requested that you have a physical therapy evaluation.  This is performed at the [facility] office at [address] [place], [place] [address] [phone])  Follow-Up: You have been referred to Dr Roxy Manns and Dr Cyndia Bent at [facility] for further TAVR evaluation.  Levonne Spiller RN is the contact in this office [phone])  Please schedule an appointment with your Dentist within the next 2 weeks.  If they cannot see you please contact the office at [phone] so that we can refer you to Dr Enrique Sack.    Any Other Special Instructions Will Be Listed Below (If Applicable).     If you need a refill on your cardiac medications before your next appointment, please call your pharmacy.

## 2017-05-28 NOTE — Progress Notes (Signed)
Cardiology Office Note Date:  05/28/2017   ID:  [name], DOB [birth year], MRN [id number]  PCP:  Lajean Manes, MD  Cardiologist:  Dr Tamala Julian  Chief Complaint  Patient presents with  . Shortness of Breath     History of Present Illness: [name] is a 83 y.o. male who presents for TAVR evaluation. He is referred by Dr Tamala Julian and Dr Aundra Dubin for further evaluation of aortic stenosis.   The patient has a long history of coronary artery disease. He underwent multivessel CABG in 2007. Other medical problems include chronic kidney disease, prostate cancer status post radiation therapy, partial left nephrectomy, and development of chronic systolic heart failure. The patient was noted to have worsening LV function in 2017 with an ejection fraction of 20-25%. At that time he underwent cardiac catheterization demonstrating patency of his LIMA to LAD graft and saphenous vein graft to PDA. Her the vein graft to the ramus intermedius was occluded. The patient's reduced ejection fraction was felt to be far out of proportion to the stent of coronary artery disease. The patient has been very difficult to manage because of an intolerance to his heart failure regimen. He has developed hypotension with hydralazine and nitrates and his creatinine has worsened with use of an ACE or ARB. He also could not tolerate spironolactone.  In October 2016 he had back surgery and had a very difficulty recovery. He developed septic shock and was hospitalized for a few months. He's been dependent on oxycodone because of chronic low back pain ever since that time. Since he's fully recovered from surgery and gotten back to normal activities, he's felt progressive fatigue and shortness of breath over the last 9 months. 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 His work has become more difficult over recent months because of fatigue and shortness of breath with activity. He complains of leg swelling  but denies orthopnea, PND, or resting symptoms of shortness of breath. He denies symptoms of chest pain, chest pressure, lightheadedness, or syncope.   He was diagnosed with prostate cancer approximately 4 years ago and was treated with radiation seed implants. About 2 years ago he was diagnosed with renal cell carcinoma and underwent right partial nephrectomy.   Past Medical History:  Diagnosis Date  . Arthritis   . Azotemia   . Cancer [facility]) 2010   prostate  . Chondromalacia   . Coronary artery disease   . Diabetes mellitus without complication ([place])   . GERD (gastroesophageal reflux disease)   . Glaucoma    "high normal"  . Gout   . H/O blood clots    R Lower leg, right upper leg  . Heel spur   . Hyperlipidemia   . Hypertension   . Joint pain   . Lesion of left native kidney   . Low back pain    "severe"  . Mild aortic stenosis   . Myocardial infarction [facility])    "told me I had a small heart attack"  . Obesity   . Osteopenia   . Osteoporosis   . Polymyalgia ([place])    h/o  . Renal cyst, left   . Shingles    LEFT ABDOMEN  . Spinal stenosis   . Thrombophlebitis   . Tinnitus   . Tubular adenoma     Past Surgical History:  Procedure Laterality Date  . APPENDECTOMY  60 years ago  . CARDIAC CATHETERIZATION    . CARDIAC CATHETERIZATION N/A  11/03/2016   Procedure: Right/Left Heart Cath and Coronary/Graft Angiography;  Surgeon: Belva Crome, MD;  Location: [facility];  Service: Cardiovascular;  Laterality: N/A;  . CORONARY ARTERY BYPASS GRAFT  2007  . PROSTATE SURGERY     seed implant  . ROBOTIC ASSITED PARTIAL NEPHRECTOMY Left 08/31/2013   Procedure: ROBOTIC ASSITED PARTIAL NEPHRECTOMY;  Surgeon: Dutch Gray, MD;  Location: [facility];  Service: Urology;  Laterality: Left;  Marland Kitchen VEIN LIGATION AND STRIPPING Right 1972   right lower leg    Current Outpatient Prescriptions  Medication Sig Dispense Refill  . alendronate (FOSAMAX) 70 MG tablet Take 70 mg by mouth once a  week. Take with a full glass of water on an empty stomach.    Marland Kitchen allopurinol (ZYLOPRIM) 300 MG tablet Take 300 mg by mouth daily.  2  . aspirin 81 MG chewable tablet Chew 81 mg by mouth daily.    . Calcium Carbonate-Vitamin D 600-200 MG-UNIT TABS Take 1 tablet by mouth daily.    . carvedilol (COREG) 3.125 MG tablet TAKE 1 TABLET BY MOUTH TWICE DAILY 60 tablet 0  . Cholecalciferol (VITAMIN D) 2000 units CAPS Take 1 capsule by mouth daily.    Marland Kitchen glipiZIDE (GLUCOTROL) 10 MG tablet Take 10 mg by mouth 2 (two) times daily before a meal.   2  . HYDROcodone-acetaminophen (NORCO) 10-325 MG tablet Take 1-2 tablets by mouth every 6 (six) hours as needed for moderate pain. 10 tablet 0  . Multiple Vitamin (MULTIVITAMIN) tablet Take 1 tablet by mouth daily.    . rosuvastatin (CRESTOR) 10 MG tablet Take 10 mg by mouth daily.    . tamsulosin (FLOMAX) 0.4 MG CAPS capsule Take 0.4 mg by mouth daily after supper.    . torsemide (DEMADEX) 20 MG tablet Take 1 tablet (20 mg total) by mouth every other day. 45 tablet 3   No current facility-administered medications for this visit.     Allergies:   Adacel [diphth-acell pertussis-tetanus]; Flexeril [cyclobenzaprine]; Lasix [furosemide]; Lipitor [atorvastatin]; Pantoprazole; Penicillins; Septra [sulfamethoxazole-trimethoprim]; and Simvastatin   Social History:  The patient  reports that he quit smoking about 36 years ago. His smoking use included Cigarettes. He has a 90.00 pack-year smoking history. He has never used smokeless tobacco. He reports that he does not drink alcohol or use drugs.   Family History:  The patient's family history includes Heart Problems in his father and mother.    ROS:  Please see the history of present illness.  Otherwise, review of systems is positive for Leg swelling, excessive fatigue, urinary hesitancy.  All other systems are reviewed and negative.    PHYSICAL EXAM: VS:  BP 128/64   Pulse 74   Ht 5' 6.5" (1.689 m)   Wt 192 lb (87.1  kg)   SpO2 95%   BMI 30.53 kg/m  , BMI Body mass index is 30.53 kg/m. GEN: Well nourished, well developed, pleasant elderly male in no acute distress  HEENT: normal  Neck: no JVD, no masses.  Cardiac: RRR with 3/6 harsh late peaking systolic murmur at the right upper sternal border, no diastolic murmur             Respiratory:  clear to auscultation bilaterally, normal work of breathing GI: soft, nontender, nondistended, + BS MS: no deformity or atrophy  Ext: 1+ pretibial edema, pedal pulses 2+= bilaterally Skin: warm and dry, no rash Neuro:  Strength and sensation are intact Psych: euthymic mood, full affect  EKG:  EKG  is not ordered today.  Recent Labs: 10/29/2016: Hemoglobin 12.4; Platelets 221 01/25/2017: NT-Pro BNP 1,209 05/18/2017: BUN 37; Creatinine, Ser 1.99; Potassium 4.0; Sodium 138   Lipid Panel  No results found for: CHOL, TRIG, HDL, CHOLHDL, VLDL, LDLCALC, LDLDIRECT    Wt Readings from Last 3 Encounters:  05/28/17 192 lb (87.1 kg)  05/18/17 189 lb 8 oz (86 kg)  04/26/17 197 lb (89.4 kg)     Cardiac Studies Reviewed: 2D Echo 03/29/2017: Left ventricle:  The cavity size was mildly dilated. Systolic function was severely reduced. The estimated ejection fraction was in the range of 25% to 30%.  Severe diffuse hypokinesis with distinct regional wall motion abnormalities.  Regional wall motion abnormalities:  Severe hypokinesis of the basal-mid anteroseptal and anterior myocardium; consistent with ischemia in the distribution of the native left anterior descending coronary artery, with a patent distal bypass graft.  Hypokinesis of the inferolateral myocardium. The pulmonary vein flow pattern was normal. Doppler parameters are consistent with abnormal left ventricular relaxation (grade 1 diastolic dysfunction). There was no evidence of elevated ventricular filling pressure by  Doppler parameters.  ------------------------------------------------------------------- Aortic valve:   Trileaflet; moderately thickened, moderately calcified leaflets. Valve mobility was severely restricted. Doppler:  There was at least moderate stenosis. The ejection jet is mid peaking. There was trivial regurgitation.    VTI ratio of LVOT to aortic valve: 0.18. Valve area (VTI): 0.79 cm^2. Indexed valve area (VTI): 0.38 cm^2/m^2. Peak velocity ratio of LVOT to aortic valve: 0.16. Valve area (Vmax): 0.7 cm^2. Indexed valve area (Vmax): 0.34 cm^2/m^2. Mean velocity ratio of LVOT to aortic valve: 0.18. Valve area (Vmean): 0.78 cm^2. Indexed valve area (Vmean): 0.38 cm^2/m^2.    Mean gradient (S): 26 mm Hg. Peak gradient (S): 42 mm Hg.  ------------------------------------------------------------------- Aorta:  Aortic root: The aortic root was normal in size. Ascending aorta: The ascending aorta was normal in size.  ------------------------------------------------------------------- Mitral valve:   Structurally normal valve.   Leaflet separation was normal.  Doppler:  Transvalvular velocity was within the normal range. There was no evidence for stenosis. There was mild regurgitation.    Peak gradient (D): 1 mm Hg.  ------------------------------------------------------------------- Left atrium:  The atrium was moderately dilated.  ------------------------------------------------------------------- Right ventricle:  The cavity size was normal. Systolic function was mildly reduced.  ------------------------------------------------------------------- Pulmonic valve:    Structurally normal valve.   Cusp separation was normal.  Doppler:  Transvalvular velocity was within the normal range. There was no regurgitation.  ------------------------------------------------------------------- Tricuspid valve:   Structurally normal valve.   Leaflet separation was normal.  Doppler:   Transvalvular velocity was within the normal range. There was no regurgitation.  ------------------------------------------------------------------- Pulmonary artery:    Systolic pressure could not be accurately estimated.  ------------------------------------------------------------------- Right atrium:  The atrium was normal in size.  ------------------------------------------------------------------- Pericardium:  There was no pericardial effusion.  ------------------------------------------------------------------- Systemic veins: Inferior vena cava: The vessel was normal in size. The respirophasic diameter changes were in the normal range (= 50%), consistent with normal central venous pressure.  Cardiac Cath 11-03-2016: Conclusion    Severe native vessel coronary disease with total occlusion of the left main and native right coronary.  Bypass graft failure with occlusion of the saphenous vein graft to the ramus intermedius.  Widely patent saphenous vein graft to the PDA and PL branch.  Widely patent LIMA to the LAD  Normal pulmonary artery pressures including capillary wedge.   Indications   Acute systolic heart failure (HCC) [I50.21 (ICD-10-CM)]  Coronary artery disease  involving autologous vein coronary bypass graft without angina pectoris [I25.810 (ICD-10-CM)]  Procedural Details/Technique   Technical Details The left radial area was sterilely prepped and draped. Intravenous sedation with Versed and fentanyl was administered. 1% Xylocaine was infiltrated to achieve local analgesia. A double wall stick with an angiocath was utilized to obtain intra-arterial access. The modified Seldinger technique was used to place a 66F " Slender" sheath in the left radial artery. Weight based heparin was administered. Coronary angiography was done using 5 F catheters. Right coronary angiography was performed with a JR4. Left ventricular hemodymic recordings were not possible. We  used multiple catheters in an attempt to cross the aortic valve but was unsuccessful. There was inability to free torque control due to the angulation from the subclavian into the aorta. Left coronary angiography was performed with a JL 3.5 cm. Left ventriculography was not attempted due to CKD.  Right heart catheterization was performed by exchanging an antecubital angiocath IV for a 5 French brachial sheath. 1% Xylocaine local infiltration at the IV site was given prior to sheath exchange. A double glove technique was used. The modified Seldinger technique was employed. After sheath insertion, right heart cath was performed using a 5 French balloon tipped catheter. Pressures were recorded in each chamber. A main pulmonary artery O2 saturation was obtained. I wedge pressure was also measured.   Hemostasis was achieved using a pneumatic band.  During this procedure the patient is administered a total of Versed 2 mg and Fentanyl 50 mg to achieve and maintain moderate conscious sedation. The patient's heart rate, blood pressure, and oxygen saturation are monitored continuously during the procedure. The period of conscious sedation is 46 minutes, of which I was present face-to-face 100% of this time.   Estimated blood loss <50 mL.  During this procedure the patient was administered the following to achieve and maintain moderate conscious sedation: Versed 2 mg, Fentanyl 50 mcg, while the patient's heart rate, blood pressure, and oxygen saturation were continuously monitored. The period of conscious sedation was 46 minutes, of which I was present face-to-face 100% of this time.    Coronary Findings   Dominance: Right  Left Main  LM lesion, 100% stenosed.  Left Anterior Descending  First Diagonal Branch  Ost 1st Diag lesion, 100% stenosed.  Second Diagonal Branch  Vessel is small in size.  Third Diagonal Branch  Vessel is small in size.  Right Coronary Artery  Prox RCA lesion, 100% stenosed.   Mid RCA to Dist RCA lesion, 100% stenosed.  Right Posterior Descending Artery  Vessel is small in size.  First Right Posterolateral  Vessel is small in size.  Graft Angiography  Free LIMA Graft to Dist LAD  LIMA.  Graft to Post Atrio  and is very large.  saphenous Graft to Ramus  SVG.  Prox Graft lesion, 100% stenosed.  Right Heart   Right Heart Pressures LV EDP is normal. Right heart pressures were normal.    Left Heart   Left Ventricle LVEF known to be 25% by echocardiography. Contrast ventriculography was not performed due to inability to cross aortic valve from the left arm approach and chronic kidney disease.    Coronary Diagrams   Diagnostic Diagram        Dobutamine Echo 04/26/2017: Study Conclusions  - Left ventricle: The cavity size was mildly dilated. There was   mild concentric hypertrophy. Systolic function was severely   reduced. The estimated ejection fraction was in the range of 20%   to  25%. Diffuse hypokinesis. - Aortic valve: Valve mobility was restricted. There was severe   stenosis. There was trivial regurgitation. - Left atrium: The atrium was mildly dilated. - Stress ECG conclusions: The stress ECG was normal.  Impressions:  - LV function severely depressed at baseline which improved with   dobutamine; mean gradient 25 mmHg at baseline and increased to 41   mmHg with dobutamine; AVA 0.8 cm 2 at baseline and with peak   infusion; dimensionless index 0.2 at baseline and peak infusion;   findings are consistent with severe AS.  STS RISK CALCULATOR: Procedure: AV Replacement  Risk of Mortality: 7.529%  Morbidity or Mortality: 35.139%  Long Length of Stay: 16.528%  Short Length of Stay: 15.819%  Permanent Stroke: 2.293%  Prolonged Ventilation: 23.715%  DSW Infection: 0.704%  Renal Failure: 14.561%  Reoperation: 11.421%   ASSESSMENT AND PLAN: This is an 83 year old gentleman with progressive, severe stage D aortic stenosis. The patient  has severe low gradient aortic stenosis associated with severe LV dysfunction, diagnosis confirmed with dobutamine stress echocardiography.  I have personally reviewed the patients echo and cardiac catheterization images. He has severe global and segmental LV systolic dysfunction probably related to a combination of ischemic and valvular heart disease. Cardiac catheterization was notable for total occlusion of the native coronary arteries and continued patency of the vein graft to PDA and LIMA to LAD with occlusion of the vein graft to ramus intermedius. There are no targets for coronary intervention or further revascularization.  I have reviewed the natural history of aortic stenosis with the patient and his wife who is present today. We have discussed the limitations of medical therapy and the poor prognosis associated with symptomatic aortic stenosis. We have reviewed potential treatment options, including palliative medical therapy, conventional surgical aortic valve replacement, and transcatheter aortic valve replacement. We discussed treatment options in the context of this patient's specific comorbid medical conditions. The patient would be at high risk for cardiac surgery with his history of previous sternotomy, advanced age, chronic kidney disease, advanced left ventricular dysfunction, and diabetes. TAVR would be a reasonable treatment alternative pending further evaluation. Without treatment, the patient has a very poor prognosis with medical therapy alone.  The patient and his wife would like to move forward with further evaluation and treatment. A gated cardiac CTA and a CTA of the chest, abdomen, and pelvis will be arranged. These studies will be staged in the context of the patient's chronic kidney disease. The sodium bicarbonate protocol will be utilized. Following completion of all studies, the patient will be referred for outpatient cardiac surgical evaluation. Dr. Servando Snare performed the  patient's initial heart surgery. The patient has partial dentures with poor dentition of his remaining teeth and I have recommended an evaluation by his dentist who he hasn't seen a long time. This will need to be done prior to valve surgery.   I reviewed transcatheter aortic valve replacement at length with the patient and his wife today. They understand the procedural expectations, typical hospital course, and average recovery period.  The patient has been advised of a variety of complications that might develop including but not limited to risks of death, stroke, paravalvular leak, aortic dissection or other major vascular complications, aortic annulus rupture, device embolization, cardiac rupture or perforation, mitral regurgitation, acute myocardial infarction, arrhythmia, heart block or bradycardia requiring permanent pacemaker placement, congestive heart failure, respiratory failure, renal failure, pneumonia, infection, other late complications related to structural valve deterioration or migration, or other complications that  might ultimately cause a temporary or permanent loss of functional independence or other long term morbidity.  All of his questions are answered today.   Current medicines are reviewed with the patient today.  The patient does not have concerns regarding medicines.  Labs/ tests ordered today include:   Orders Placed This Encounter  Procedures  . CT CORONARY MORPH W/CTA COR W/SCORE W/CA W/CM &/OR WO/CM  . CT ANGIO CHEST AORTA W &/OR WO CONTRAST  . CT Angio Abd/Pel w/ and/or w/o  . Basic metabolic panel  . Ambulatory referral to Physical Therapy    Disposition:   FU pending further test results  Signed, Sherren Mocha, MD  05/28/2017 1:59 PM    [facility] [address] [place], [place], [place]  [address] Phone: [phone]; Fax: [phone]

## 2017-05-29 LAB — BASIC METABOLIC PANEL
BUN/Creatinine Ratio: 17 (ref 10–24)
BUN: 31 mg/dL — ABNORMAL HIGH (ref 8–27)
CALCIUM: 10 mg/dL (ref 8.6–10.2)
CO2: 24 mmol/L (ref 18–29)
CREATININE: 1.84 mg/dL — AB (ref 0.76–1.27)
Chloride: 102 mmol/L (ref 96–106)
GFR calc Af Amer: 39 mL/min/{1.73_m2} — ABNORMAL LOW (ref >59)
GFR, EST NON AFRICAN AMERICAN: 34 mL/min/{1.73_m2} — AB (ref >59)
Glucose: 82 mg/dL (ref 65–99)
Potassium: 4.3 mmol/L (ref 3.5–5.2)
SODIUM: 142 mmol/L (ref 134–144)

## 2017-06-01 ENCOUNTER — Telehealth: Payer: Self-pay | Admitting: Interventional Cardiology

## 2017-06-01 NOTE — Telephone Encounter (Signed)
Pt states he thought Anderson Malta called him Friday to see how he was doing.  Pt states he is calling to report in the last week he has had small  improvement in his shortness of breath and lower extremity edema, pt does not weigh daily but feels his weight is not up. Pt states he is taking torsemide 20 mg 3 x/week.  Pt asked me to forward this information to [facility] and Dr Tamala Julian.

## 2017-06-01 NOTE — Telephone Encounter (Signed)
New Message   Also someone called him and he is not sure why?  Pt c/o swelling: STAT is pt has developed SOB within 24 hours  1. How long have you been experiencing swelling? It comes and goes   2. Where is the swelling located?  Both feet and legs   3.  Are you currently taking a "fluid pill"? Yes 3x a week  4.  Are you currently SOB? no  5.  Have you traveled recently? no

## 2017-06-01 NOTE — Telephone Encounter (Incomplete)
Pt states he thought [name] called him Friday to see how he was doing.  Pt states he is calling to report in the last week he has had small  improvement in his shortness of breath and lower extremity edema, pt does not weigh daily but feels his weight is not up. Pt states he is taking torsemide 20 mg 3 x/week.  Pt asked me to forward this information to [place] and D

## 2017-06-02 NOTE — Telephone Encounter (Signed)
Spoke with pt and went over recommendations per Dr. Smith.  Pt verbalized understanding and was in agreement with this plan.   

## 2017-06-02 NOTE — Telephone Encounter (Signed)
It would be okay to use Demadex daily if swelling and shortness of breath worsen. He will only be able to do this for limited time frame, perhaps 3 consecutive days. The limitation is because previously, every day therapy leads to significant kidney impairment.

## 2017-06-03 ENCOUNTER — Ambulatory Visit (HOSPITAL_BASED_OUTPATIENT_CLINIC_OR_DEPARTMENT_OTHER)
Admission: RE | Admit: 2017-06-03 | Discharge: 2017-06-03 | Disposition: A | Discharge status: Home / Self Care | Payer: PPO | Source: Ambulatory Visit | Attending: Cardiovascular Disease | Admitting: Cardiovascular Disease

## 2017-06-03 ENCOUNTER — Ambulatory Visit (HOSPITAL_COMMUNITY): Payer: PPO

## 2017-06-03 ENCOUNTER — Ambulatory Visit (HOSPITAL_COMMUNITY)
Admission: RE | Admit: 2017-06-03 | Discharge: 2017-06-03 | Disposition: A | Discharge status: Home / Self Care | Payer: PPO | Source: Ambulatory Visit | Attending: Cardiovascular Disease | Admitting: Cardiovascular Disease

## 2017-06-03 DIAGNOSIS — K5730 Diverticulosis of large intestine without perforation or abscess without bleeding: Secondary | ICD-10-CM | POA: Insufficient documentation

## 2017-06-03 DIAGNOSIS — Z4682 Encounter for fitting and adjustment of non-vascular catheter: Secondary | ICD-10-CM | POA: Diagnosis not present

## 2017-06-03 DIAGNOSIS — I350 Nonrheumatic aortic (valve) stenosis: Secondary | ICD-10-CM | POA: Diagnosis not present

## 2017-06-03 DIAGNOSIS — N289 Disorder of kidney and ureter, unspecified: Secondary | ICD-10-CM

## 2017-06-03 DIAGNOSIS — I6523 Occlusion and stenosis of bilateral carotid arteries: Secondary | ICD-10-CM | POA: Insufficient documentation

## 2017-06-03 DIAGNOSIS — R911 Solitary pulmonary nodule: Secondary | ICD-10-CM | POA: Diagnosis not present

## 2017-06-03 LAB — VAS US CAROTID
LCCADDIAS: -21 cm/s
LCCADSYS: -87 cm/s
LCCAPDIAS: 17 cm/s
LEFT ECA DIAS: -12 cm/s
LEFT VERTEBRAL DIAS: -20 cm/s
Left CCA prox sys: 104 cm/s
Left ICA dist dias: -27 cm/s
Left ICA dist sys: -81 cm/s
RCCADSYS: -105 cm/s
RIGHT ECA DIAS: -16 cm/s
RIGHT VERTEBRAL DIAS: -23 cm/s
Right CCA prox dias: 19 cm/s
Right CCA prox sys: 89 cm/s

## 2017-06-03 MED ORDER — IOPAMIDOL (ISOVUE-300) INJECTION 61%
INTRAVENOUS | Status: AC
  Filled 2017-06-03: qty 100

## 2017-06-03 MED ORDER — IOPAMIDOL (ISOVUE-370) INJECTION 76%
INTRAVENOUS | Status: AC
  Administered 2017-06-03: 100 mL via INTRAVENOUS
  Filled 2017-06-03: qty 100

## 2017-06-03 MED ORDER — SODIUM BICARBONATE 8.4 % IV SOLN
INTRAVENOUS | Status: AC
  Administered 2017-06-03: 14:00:00 via INTRAVENOUS
  Filled 2017-06-03: qty 500

## 2017-06-03 MED ORDER — SODIUM BICARBONATE BOLUS VIA INFUSION
INTRAVENOUS | Status: AC
  Administered 2017-06-03: 37.5 meq via INTRAVENOUS
  Filled 2017-06-03: qty 1

## 2017-06-07 ENCOUNTER — Other Ambulatory Visit: Payer: Self-pay | Admitting: *Deleted

## 2017-06-07 DIAGNOSIS — N289 Disorder of kidney and ureter, unspecified: Secondary | ICD-10-CM

## 2017-06-07 DIAGNOSIS — I350 Nonrheumatic aortic (valve) stenosis: Secondary | ICD-10-CM

## 2017-06-09 ENCOUNTER — Ambulatory Visit (HOSPITAL_COMMUNITY)
Admission: RE | Admit: 2017-06-09 | Discharge: 2017-06-09 | Disposition: A | Discharge status: Home / Self Care | Payer: PPO | Source: Ambulatory Visit | Attending: Cardiology | Admitting: Cardiology

## 2017-06-09 ENCOUNTER — Encounter (HOSPITAL_COMMUNITY): Payer: Self-pay

## 2017-06-09 VITALS — BP 119/61 | HR 71 | Wt 197.8 lb

## 2017-06-09 DIAGNOSIS — E785 Hyperlipidemia, unspecified: Secondary | ICD-10-CM | POA: Insufficient documentation

## 2017-06-09 DIAGNOSIS — I5022 Chronic systolic (congestive) heart failure: Secondary | ICD-10-CM | POA: Diagnosis not present

## 2017-06-09 DIAGNOSIS — Z8546 Personal history of malignant neoplasm of prostate: Secondary | ICD-10-CM | POA: Diagnosis not present

## 2017-06-09 DIAGNOSIS — Z7982 Long term (current) use of aspirin: Secondary | ICD-10-CM | POA: Insufficient documentation

## 2017-06-09 DIAGNOSIS — Z79899 Other long term (current) drug therapy: Secondary | ICD-10-CM | POA: Insufficient documentation

## 2017-06-09 DIAGNOSIS — I350 Nonrheumatic aortic (valve) stenosis: Secondary | ICD-10-CM | POA: Diagnosis not present

## 2017-06-09 DIAGNOSIS — Z905 Acquired absence of kidney: Secondary | ICD-10-CM | POA: Diagnosis not present

## 2017-06-09 DIAGNOSIS — I130 Hypertensive heart and chronic kidney disease with heart failure and stage 1 through stage 4 chronic kidney disease, or unspecified chronic kidney disease: Secondary | ICD-10-CM | POA: Insufficient documentation

## 2017-06-09 DIAGNOSIS — Z7984 Long term (current) use of oral hypoglycemic drugs: Secondary | ICD-10-CM | POA: Insufficient documentation

## 2017-06-09 DIAGNOSIS — I2510 Atherosclerotic heart disease of native coronary artery without angina pectoris: Secondary | ICD-10-CM | POA: Insufficient documentation

## 2017-06-09 DIAGNOSIS — Z87891 Personal history of nicotine dependence: Secondary | ICD-10-CM | POA: Insufficient documentation

## 2017-06-09 DIAGNOSIS — N183 Chronic kidney disease, stage 3 (moderate): Secondary | ICD-10-CM | POA: Diagnosis not present

## 2017-06-09 DIAGNOSIS — E1122 Type 2 diabetes mellitus with diabetic chronic kidney disease: Secondary | ICD-10-CM | POA: Insufficient documentation

## 2017-06-09 DIAGNOSIS — N189 Chronic kidney disease, unspecified: Secondary | ICD-10-CM

## 2017-06-09 DIAGNOSIS — M109 Gout, unspecified: Secondary | ICD-10-CM | POA: Insufficient documentation

## 2017-06-09 LAB — BASIC METABOLIC PANEL
Anion gap: 5 (ref 5–15)
BUN: 24 mg/dL — ABNORMAL HIGH (ref 6–20)
CALCIUM: 9.7 mg/dL (ref 8.9–10.3)
CO2: 31 mmol/L (ref 22–32)
CREATININE: 1.7 mg/dL — AB (ref 0.61–1.24)
Chloride: 104 mmol/L (ref 101–111)
GFR calc non Af Amer: 36 mL/min — ABNORMAL LOW (ref >60)
GFR, EST AFRICAN AMERICAN: 42 mL/min — AB (ref >60)
Glucose, Bld: 110 mg/dL — ABNORMAL HIGH (ref 65–99)
Potassium: 4.2 mmol/L (ref 3.5–5.1)
Sodium: 140 mmol/L (ref 135–145)

## 2017-06-09 LAB — BRAIN NATRIURETIC PEPTIDE: B Natriuretic Peptide: 175.4 pg/mL — ABNORMAL HIGH (ref 0.0–100.0)

## 2017-06-09 MED ORDER — TORSEMIDE 20 MG PO TABS: 20.0000 mg | ORAL_TABLET | Freq: Every day | ORAL | 3 refills | Status: DC

## 2017-06-09 MED ORDER — MAGNESIUM OXIDE -MG SUPPLEMENT 200 MG PO TABS: 200.0000 mg | ORAL_TABLET | Freq: Every day | ORAL | 0 refills | Status: DC

## 2017-06-09 MED ORDER — MAGNESIUM OXIDE -MG SUPPLEMENT 200 MG PO TABS: 200.0000 mg | ORAL_TABLET | Freq: Every day | ORAL | 6 refills | Status: DC

## 2017-06-09 NOTE — Telephone Encounter (Signed)
New Message ° ° pt verbalized that she is returning call for rn °

## 2017-06-09 NOTE — Telephone Encounter (Signed)
Pt states that he was seen by Dr. Aundra Dubin today and he changed his Torsemide.  Pt was concerned about these changes because of his kidney fx going up when previously on higher doses.  Advised pt that is why Dr. Aundra Dubin has him coming back in a week to check his labs.  Advised pt to continue with plan outlined by Dr. Aundra Dubin and have labs drawn next week.  Pt appreciative for call.

## 2017-06-09 NOTE — Patient Instructions (Addendum)
Increase Torsemide to 40 mg (2 tabs) FOR TODAY ONLY, then take 20 mg (1 tab) DAILY  Start Mag-ox 200 mg daily  Labs today  Labs in 1 week  Please wear compression hose daily  Your physician recommends that you schedule a follow-up appointment in: 1 month

## 2017-06-09 NOTE — Progress Notes (Signed)
PCP: Dr. Felipa Eth Cardiology: Dr. Tamala Julian HF Cardiology: Dr. Aundra Dubin   83 yo with history of CAD s/p CABG, chronic systolic CHF, CKD stage 3, and suspected severe aortic stenosis presents for CHF clinic followup.  Patient had CABG in 2007.  In 10/17, he had an echo showing EF down to 20-25%.  With fall in EF, he ended up having a right and left heart cath done in 11/17.  The cath showed normal right and left heart filling pressures.  The LIMA-LAD and SVG-PDA were patent but the SVG-ramus was occluded.  The fall in EF was thought to be disproportionate to the coronary disease. Echo in 4/18 was concerning for low gradient severe AS.  This was confirmed in 5/18 by low dose DSE.   He has had a hard time getting on cardiac meds.  Hydralazine/nitrates were not tolerated due to hypotension.  He has not been on ACEI/ARB/ARNI due to elevated creatinine.  He did not tolerate spironolactone.  He was then on the combination of torsemide, eplerenone, and low dose Coreg.   He felt sleepy/over-sedated and creatinine rose, so he stopped both torsemide and eplerenone.  When I saw him initially, I put him back on torsemide 20 mg every other day.   He has seen Dr Burt Knack, and TAVR is planned tentatively depending on the outcome of his CT scans (still awaiting the coronary CT).     His main complaint currently is low back pain.  It is quite severe.  He sees Dr Carloyn Manner, apparently there are not a lot of options at this point.    He is short of breath after walking about 200 feet.  He still has peripheral edema and is convinced that torsemide makes him swell more.  Weight is up.  No orthopnea/PND.  No lightheadedness/syncope. No falls.  No chest pain.   Labs (5/18): K 4.7, creatinine 2.29 Labs (6/18): K 4.3, creatinine 1.84  PMH: 1. Low back pain: Severe, from arthritis.  2. Gout 3. Hyperlipidemia: Myalgias with simvastatin and atorvastatin.  4. Prostate cancer s/p radiation.  5. Renal cell carcinoma s/p left partial  nephrectomy.  6. HTN 7. Type II diabetes 8. CAD: s/p CABG in 2007.  - LHC/RHC (11/17): Totally occluded left main and RCA. Totally occluded SVG-ramus.  Patent SVG-PDA and LIMA-LAD.  Mean RA 7, PA 21/4, mean PCWP 6, CI 2.72.  9. CKD: Stage 3.  10. Chronic systolic CHF: Ischemic cardiomyopathy, though severe AS may play a roll.  - Echo (10/17): EF 20-25%, aortic stenosis reported as mild.  - Echo (4/18): EF 25-30%, low gradient severe AS suspected, mild MR, RV function mildly reduced.  11. Aortic stenosis: Suspect low gradient severe AS.  - Low dose DSE (5/18): EF 20-25%, mean gradient across the aortic valve rose from 25 mmHg => 41 mmHg and AVA remained 0.8 cm^2.   12. Carotid dopplers (6/18) with mild bilateral carotid stenosis.   SH: Lives with wife in [facility].  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  He has not smoked in > 30 years.   Family History  Problem Relation Age of Onset  . Heart Problems Mother   . Heart Problems Father    ROS: All systems reviewed and negative except as per HPI.   Current Outpatient Prescriptions  Medication Sig Dispense Refill  . alendronate (FOSAMAX) 70 MG tablet Take 70 mg by mouth once a week. Take with a full glass of water on an empty stomach.    Marland Kitchen allopurinol (ZYLOPRIM)  300 MG tablet Take 300 mg by mouth daily.  2  . aspirin 81 MG chewable tablet Chew 81 mg by mouth daily.    . Calcium Carbonate-Vitamin D 600-200 MG-UNIT TABS Take 1 tablet by mouth daily.    . carvedilol (COREG) 3.125 MG tablet TAKE 1 TABLET BY MOUTH TWICE DAILY 60 tablet 0  . Cholecalciferol (VITAMIN D) 2000 units CAPS Take 1 capsule by mouth daily.    Marland Kitchen glipiZIDE (GLUCOTROL) 10 MG tablet Take 10 mg by mouth 2 (two) times daily before a meal.   2  . HYDROcodone-acetaminophen (NORCO) 10-325 MG tablet Take 1-2 tablets by mouth every 6 (six) hours as needed for moderate pain. 10 tablet 0  . Multiple Vitamin (MULTIVITAMIN) tablet Take 1 tablet by mouth daily.    .  rosuvastatin (CRESTOR) 10 MG tablet Take 10 mg by mouth daily.    . tamsulosin (FLOMAX) 0.4 MG CAPS capsule Take 0.4 mg by mouth daily after supper.    . torsemide (DEMADEX) 20 MG tablet Take 1 tablet (20 mg total) by mouth daily. 45 tablet 3  . Magnesium Oxide 200 MG TABS Take 1 tablet (200 mg total) by mouth daily. 30 tablet 6   No current facility-administered medications for this encounter.    BP 119/61   Pulse 71   Wt 197 lb 12 oz (89.7 kg)   SpO2 99%   BMI 31.44 kg/m  General: NAD Neck: JVP 9-10 cm with HJR, no thyromegaly or thyroid nodule.  Lungs: Clear to auscultation bilaterally with normal respiratory effort. CV: Nondisplaced PMI.  Heart regular S1/S2, no S3/S4, 3/6 crescendo-decrescendo murmur RUSB with clear S2.  1+edema to the knees bilaterally.  Bilateral lower leg varicosities. No carotid bruit.  Normal pedal pulses.  Abdomen: Soft, nontender, no hepatosplenomegaly, no distention.  Skin: Intact without lesions or rashes.  Neurologic: Alert and oriented x 3.  Psych: Normal affect. Extremities: No clubbing or cyanosis.  HEENT: Normal.    Assessment/Plan: 1. CAD: s/p CABG.  Last cath in 11/17 with patent LIMA-LAD, patent SVG-PDA, and occluded SVG-ramus.  No intervention.  Patient has had no chest pain.  - Continue ASA 81 and Crestor.  2. CKD: Stage 3.  Followed by Dr. Justin Mend. CTs are being spaced out to try to lessen renal risk.  3. Chronic systolic CHF: Ischemic and possibly valvular (low gradient severe AS) cardiomyopathy.  Most recent echo in 4/18 with EF 25-30%.  He is volume overloaded on exam, more so than last appointment.  Severe AS may be making volume management difficult.  He has tolerated cardiac meds poorly and is only on Coreg currently.  - Continue Coreg 3.125 mg bid. - Hold off on ACEI/ARB/ARNI with elevated creatinine.  - Hold off on hydralazine/Imdur for now, caused hypotension.  - Eventually will restart him on eplerenone.  - He will take torsemide 40 mg  po today then start on 20 mg po daily rather than every other day.  Follow creatinine closely, will check BMET today and again in 10 days.  He will also wear graded compression stockings.  4. Aortic stenosis: Suspect low gradient, severe AS based on DSE from 5/18.  This may actually be part of the reason why he tolerates BP-active meds so poorly.  - TAVR planned, currently undergoing workup.  5. Hyperlipidemia: Continue Crestor.   Followup in 3 wks.   Loralie Champagne 06/09/2017

## 2017-06-11 ENCOUNTER — Other Ambulatory Visit: Payer: Self-pay | Admitting: *Deleted

## 2017-06-11 ENCOUNTER — Ambulatory Visit (HOSPITAL_COMMUNITY)
Admission: RE | Admit: 2017-06-11 | Discharge: 2017-06-11 | Disposition: A | Discharge status: Home / Self Care | Payer: PPO | Source: Ambulatory Visit | Attending: Cardiovascular Disease | Admitting: Cardiovascular Disease

## 2017-06-11 ENCOUNTER — Encounter (HOSPITAL_COMMUNITY): Payer: Self-pay

## 2017-06-11 DIAGNOSIS — I350 Nonrheumatic aortic (valve) stenosis: Secondary | ICD-10-CM | POA: Diagnosis not present

## 2017-06-11 LAB — GLUCOSE, CAPILLARY: Glucose-Capillary: 106 mg/dL — ABNORMAL HIGH (ref 65–99)

## 2017-06-11 MED ORDER — IOPAMIDOL (ISOVUE-370) INJECTION 76%
INTRAVENOUS | Status: AC
  Administered 2017-06-11: 100 mL
  Filled 2017-06-11: qty 100

## 2017-06-11 MED ORDER — SODIUM BICARBONATE BOLUS VIA INFUSION
INTRAVENOUS | Status: AC
  Administered 2017-06-11: 75 meq via INTRAVENOUS
  Filled 2017-06-11: qty 1

## 2017-06-11 MED ORDER — DEXTROSE 5 % IV SOLN
INTRAVENOUS | Status: AC
  Administered 2017-06-11: 12:00:00 via INTRAVENOUS
  Filled 2017-06-11: qty 500

## 2017-06-11 NOTE — Addendum Note (Signed)
Addended by: Dorothy Spark on: 06/11/2017 08:56 AM   Modules accepted: Orders, SmartSet

## 2017-06-11 NOTE — Addendum Note (Signed)
Encounter addended by: Dorothy Spark, MD on: 06/11/2017  8:55 AM<BR>    Actions taken: Order list changed

## 2017-06-14 ENCOUNTER — Telehealth (HOSPITAL_COMMUNITY): Payer: Self-pay

## 2017-06-14 NOTE — Telephone Encounter (Signed)
Patient left VM on CHF clinic triage line reporting bilateral hand numbness and left hand pain overnight, worsening through morning. States he got up and walked around and symptoms have improved.  Medication regimen has not changed, does not report any other difference to daily regimen. No other s/s to report.   Will move lab apt scheduled for Wednesday to recheck bmet to tomorrow and forward to Dr. Aundra Dubin to advise. Advised patient to report to ED to eval but patient would rather wait to hear from Dr. Aundra Dubin.  Advised if s/s become worse to report to ED.  Renee Pain, RN

## 2017-06-14 NOTE — Telephone Encounter (Signed)
Bilateral hand numbness more likely neuropathic than cardiac-related.  If not chest pain, would follow.

## 2017-06-15 ENCOUNTER — Institutional Professional Consult (permissible substitution) (INDEPENDENT_AMBULATORY_CARE_PROVIDER_SITE_OTHER): Payer: PPO | Admitting: Cardiothoracic Surgery

## 2017-06-15 ENCOUNTER — Ambulatory Visit: Payer: PPO | Attending: Geriatric Medicine | Admitting: Physical Therapy

## 2017-06-15 ENCOUNTER — Encounter: Payer: Self-pay | Admitting: Cardiothoracic Surgery

## 2017-06-15 ENCOUNTER — Inpatient Hospital Stay (HOSPITAL_COMMUNITY): Admission: RE | Admit: 2017-06-15 | Payer: PPO | Source: Ambulatory Visit

## 2017-06-15 ENCOUNTER — Encounter: Payer: Self-pay | Admitting: Physical Therapy

## 2017-06-15 VITALS — BP 101/65 | HR 67 | Resp 16 | Ht 66.5 in | Wt 195.0 lb

## 2017-06-15 DIAGNOSIS — R293 Abnormal posture: Secondary | ICD-10-CM | POA: Insufficient documentation

## 2017-06-15 DIAGNOSIS — R2689 Other abnormalities of gait and mobility: Secondary | ICD-10-CM | POA: Insufficient documentation

## 2017-06-15 DIAGNOSIS — Z951 Presence of aortocoronary bypass graft: Secondary | ICD-10-CM | POA: Diagnosis not present

## 2017-06-15 DIAGNOSIS — I350 Nonrheumatic aortic (valve) stenosis: Secondary | ICD-10-CM | POA: Diagnosis not present

## 2017-06-15 NOTE — Progress Notes (Signed)
[name]       [place] [address]             [phone]                    [name] [facility] Medical Record #[id number] Date of Birth: 12-Sep-1934  Referring: Steven Dresser, MD Primary Care: Lajean Manes, MD  Chief Complaint:    Chief Complaint  Patient presents with  . Aortic Stenosis    Surgical eval for TAVR, review all studies, Surgery scheduled for 06/22/17,HX of CABG 2007    History of Present Illness:    [name] 83 y.o. male is seen in the office  today for Critical aortic stenosis. Valve area by echocardiogram 0.8 cm. The patient is a previous history of coronary artery bypass grafting done in 2007. At that time he had a sequential reverse saphenous vein graft to the distal right coronary and posterior lateral, vein graft to the intermediate and left internal mammary to the left anterior descending coronary artery. Follow-up cardiac catheterization in November 2017 demonstrated aneurysmal degeneration of the vein graft to the right intermediate vein graft is occluded patient has a large left internal mammary artery supplying the LAD EF 20-25%. He notes since October 2016, when he was admitted in septic shock after back surgery he is been bothered by progressive shortness of breath with exertion and mild chest discomfort. He denies syncope or presyncope. Serial echocardiograms confirm critical aortic stenosis.    He was diagnosed with prostate cancer approximately 4 years ago and was treated with radiation seed implants. About 2 years ago he was diagnosed with renal cell carcinoma and underwent right partial nephrectomy.    Current Activity/ Functional Status:  Patient is independent with mobility/ambulation, transfers, ADL's, IADL's.   Zubrod Score: At the time of surgery this patient's most appropriate activity status/level should be described as: []     0    Normal activity, no symptoms []     1    Restricted in physical  strenuous activity but ambulatory, able to do out light work [x]     2    Ambulatory and capable of self care, unable to do work activities, up and about               >50 % of waking hours                              []     3    Only limited self care, in bed greater than 50% of waking hours []     4    Completely disabled, no self care, confined to bed or chair []     5    Moribund   Past Medical History:  Diagnosis Date  . Arthritis   . Azotemia   . Cancer [facility]) 2010   prostate  . Chondromalacia   . Coronary artery disease   . Diabetes mellitus without complication ([place])   . GERD (gastroesophageal reflux disease)   . Glaucoma    "high normal"  . Gout   . H/O blood clots    R Lower leg, right upper leg  . Heel spur   . Hyperlipidemia   . Hypertension   . Joint pain   . Lesion of left native kidney   . Low back pain    "severe"  . Mild aortic stenosis   .  Myocardial infarction [facility])    "told me I had a small heart attack"  . Obesity   . Osteopenia   . Osteoporosis   . Polymyalgia ([place])    h/o  . Renal cyst, left   . Shingles    LEFT ABDOMEN  . Spinal stenosis   . Thrombophlebitis   . Tinnitus   . Tubular adenoma     Past Surgical History:  Procedure Laterality Date  . APPENDECTOMY  60 years ago  . CARDIAC CATHETERIZATION    . CARDIAC CATHETERIZATION N/A 11/03/2016   Procedure: Right/Left Heart Cath and Coronary/Graft Angiography;  Surgeon: Belva Crome, MD;  Location: [place] [facility];  Service: Cardiovascular;  Laterality: N/A;  . CORONARY ARTERY BYPASS GRAFT  2007  . PROSTATE SURGERY     seed implant  . ROBOTIC ASSITED PARTIAL NEPHRECTOMY Left 08/31/2013   Procedure: ROBOTIC ASSITED PARTIAL NEPHRECTOMY;  Surgeon: Dutch Gray, MD;  Location: [facility];  Service: Urology;  Laterality: Left;  Marland Kitchen VEIN LIGATION AND STRIPPING Right 1972   right lower leg    Family History  Problem Relation Age of Onset  . Heart Problems Mother   . Heart Problems Father      Social History   Social History  . Marital status: Married    Spouse name: N/A  . Number of children: N/A  . Years of education: N/A   Occupational History  . Not on file.   Social History Main Topics  . Smoking status: Former Smoker    Packs/day: 3.00    Years: 30.00    Types: Cigarettes    Quit date: 12/21/1980  . Smokeless tobacco: Never Used  . Alcohol use No  . Drug use: No  . Sexual activity: Not on file   Other Topics Concern  . Not on file   Social History Narrative  . No narrative on file    History  Smoking Status  . Former Smoker  . Packs/day: 3.00  . Years: 30.00  . Types: Cigarettes  . Quit date: 12/21/1980  Smokeless Tobacco  . Never Used    History  Alcohol Use No     Allergies  Allergen Reactions  . Adacel [Diphth-Acell Pertussis-Tetanus]     Pt not sure of reaction  . Flexeril [Cyclobenzaprine]     Causes confusion  . Lasix [Furosemide] Swelling  . Lipitor [Atorvastatin] Other (See Comments)    Muscle pain  . Pantoprazole Diarrhea  . Penicillins Swelling    UNKNOWN Swelling of the feet  Has patient had a PCN reaction causing immediate rash, facial/tongue/throat swelling, SOB or lightheadedness with hypotension: no Has patient had a PCN reaction causing severe rash involving mucus membranes or skin necrosis: {no Has patient had a PCN reaction that required hospitalization {no Has patient had a PCN reaction occurring within the last 10 years: {no If all of the above answers are "NO", then may proceed with Cephalosporin use.  Sarina Ill [Sulfamethoxazole-Trimethoprim]     Pt unsure of reaction   . Simvastatin     MUSCLE PAIN    Current Outpatient Prescriptions  Medication Sig Dispense Refill  . alendronate (FOSAMAX) 70 MG tablet Take 70 mg by mouth once a week. Take with a full glass of water on an empty stomach.    Marland Kitchen allopurinol (ZYLOPRIM) 300 MG tablet Take 300 mg by mouth daily.  2  . aspirin 81 MG chewable tablet Chew 81 mg by  mouth every evening.     Marland Kitchen  Calcium Carbonate-Vitamin D 600-200 MG-UNIT TABS Take 1 tablet by mouth daily.    . carvedilol (COREG) 3.125 MG tablet TAKE 1 TABLET BY MOUTH TWICE DAILY 60 tablet 0  . diclofenac sodium (VOLTAREN) 1 % GEL Apply 2 g topically daily as needed (pain).    Marland Kitchen glipiZIDE (GLUCOTROL) 10 MG tablet Take 5-10 mg by mouth 2 (two) times daily before a meal. 10 mg in the morning and 5 mg in the evening  2  . HYDROcodone-acetaminophen (NORCO) 10-325 MG tablet Take 1-2 tablets by mouth every 6 (six) hours as needed for moderate pain. (Patient taking differently: Take 1-2 tablets by mouth 3 (three) times daily as needed for moderate pain. Depends on pain level if takes 1-2 tablets) 10 tablet 0  . Liniments (SALONPAS PAIN RELIEF PATCH EX) Apply 1 patch topically daily as needed (pain).    . Magnesium Oxide 200 MG TABS Take 1 tablet (200 mg total) by mouth daily. 30 tablet 6  . Multiple Vitamin (MULTIVITAMIN) tablet Take 1 tablet by mouth daily.    . rosuvastatin (CRESTOR) 10 MG tablet Take 10 mg by mouth daily.    . tamsulosin (FLOMAX) 0.4 MG CAPS capsule Take 0.4 mg by mouth daily after supper.    . torsemide (DEMADEX) 20 MG tablet Take 1 tablet (20 mg total) by mouth daily. (Patient taking differently: Take 20 mg by mouth every evening. ) 45 tablet 3   No current facility-administered medications for this visit.       Review of Systems:     Cardiac Review of Systems: Y or N  Chest Pain Blue.Reese    ]  Resting SOB [ n  ] Exertional SOB  Blue.Reese  ]  Orthopnea Blue.Reese  ]   Pedal Edema [ y  ]    Palpitations [ y ] Syncope  Florencio.Farrier  ]   Presyncope [ n  ]  General Review of Systems: [Y] = yes [  ]=no Constitional: recent weight change [  ];  Wt loss over the last 3 months [   ] anorexia [  ]; fatigue [  ]; nausea [  ]; night sweats [n  ]; fever [  ]; or chills [  ];          Dental: poor dentition[  ]; Last Dentist visit:   Eye : blurred vision [  ]; diplopia [   ]; vision changes [  ];  Amaurosis fugax[   ]; Resp: cough [  ];  wheezing[  ];  hemoptysis[  ]; shortness of breath[ y ]; paroxysmal nocturnal dyspnea[  ]; dyspnea on exertion[y  ]; or orthopnea[ y ];  GI:  gallstones[  ], vomiting[  ];  dysphagia[  ]; melena[n  ];  hematochezia [  ]; heartburn[  ];   Hx of  Colonoscopy[  ]; GU: kidney stones [  ]; hematuria[  ];   dysuria [  ];  nocturia[  ];  history of     obstruction [  ]; urinary frequency [  ]             Skin: rash, swelling[  ];, hair loss[  ];  peripheral edema[  ];  or itching[  ]; Musculosketetal: myalgias[  ];  joint swelling[  ];  joint erythema[  ];  joint pain[  ];  back pain[severe  ];  Heme/Lymph: bruising[  ];  bleeding[  ];  anemia[  ];  Neuro: TIA[  ];  headaches[  ];  stroke[n  ];  vertigo[  ];  seizures[  ];   paresthesias[  ];  difficulty walking[ y ];  Psych:depression[  ]; anxiety[  ];  Endocrine: diabetes[  ];  thyroid dysfunction[  ];  Immunizations: Flu up to date [ y ]; Pneumococcal up to date [  y];  Other:  Physical Exam: BP 101/65 (BP Location: Right Arm, Patient Position: Sitting, Cuff Size: Large)   Pulse 67   Resp 16   Ht 5' 6.5" (1.689 m)   Wt 195 lb (88.5 kg)   SpO2 97% Comment: RA  BMI 31.00 kg/m   PHYSICAL EXAMINATION: General appearance: alert, cooperative and appears older than stated age Head: Normocephalic, without obvious abnormality, atraumatic Neck: no adenopathy, no carotid bruit, no JVD, supple, symmetrical, trachea midline and thyroid not enlarged, symmetric, no tenderness/mass/nodules Lymph nodes: Cervical, supraclavicular, and axillary nodes normal. Resp: diminished breath sounds bibasilar Back: symmetric, no curvature. ROM normal. No CVA tenderness. Cardio: systolic murmur: holosystolic 4/6, crescendo throughout the precordium GI: soft, non-tender; bowel sounds normal; no masses,  no organomegaly Extremities: varicose veins noted Neurologic: Grossly normal  Diagnostic Studies & Laboratory data:     Recent Radiology  Findings:   Ct Coronary Morph W/cta Cor W/score W/ca W/cm &/or Wo/cm  Addendum Date: 06/11/2017   ADDENDUM REPORT: 06/11/2017 16:08 CLINICAL DATA:  83 year old male with severe aortic stenosis being evaluated for TAVR. EXAM: Cardiac TAVR CT TECHNIQUE: The patient was scanned on a Philips 256 scanner. A 120 kV retrospective scan was triggered in the descending thoracic aorta at 111 HU's. Gantry rotation speed was 270 msecs and collimation was .9 mm. 10 mg of iv Metoprolol and no nitro were given. The 3D data set was reconstructed in 5% intervals of the R-R cycle. Systolic and diastolic phases were analyzed on a dedicated work station using MPR, MIP and VRT modes. The patient received 80 cc of contrast. FINDINGS: Aortic Valve: Trileaflet, moderately thickened and calcified aortic valve with severely restricted leaflet opening. No calcifications extending into the LVOT. LVOT is large. Aorta:  Normal size, trivial calcifications.  No dissection. Sinotubular Junction:  30 x 28 mm Ascending Thoracic Aorta:  37 x 36 mm Aortic Arch:  Not visualized Descending Thoracic Aorta:  26 x 23 mm Sinus of Valsalva Measurements: Non-coronary:  34 mm Right -coronary:  29 mm Left -coronary:  34 mm Coronary Artery Height above Annulus: Left Main:  14 mm Right Coronary:  13 mm Virtual Basal Annulus Measurements: Maximum/Minimum Diameter:  28 x 24 mm Perimeter:  84 mm Area:  535 mm2 Optimum Fluoroscopic Angle for Delivery:  LAO 0 CAU 0 IMPRESSION: 1. Trileaflet, moderately thickened and calcified aortic valve with severely restricted leaflet opening. No calcifications extending into the LVOT. LVOT is large and makes annular sizing challenging. Annular measurements suitable for a delivery of a 26 mm Edwards-SAPIEN 3 valve. 2.  Sufficient annulus to coronary distance. 3. Optimum Fluoroscopic Angle for Delivery:  LAO 0 CAU 0 4.  No thrombus in the left atrial appendage. Ena Dawley Electronically Signed   By: Ena Dawley   On:  06/11/2017 16:08   Result Date: 06/11/2017 EXAM: OVER-READ INTERPRETATION  CT CHEST The following report is an over-read performed by radiologist Dr. Collene Leyden [facility], [place] on 06/11/2017. This over-read does not include interpretation of cardiac or coronary anatomy or pathology. The coronary CTA interpretation by the cardiologist is attached. COMPARISON:  06/03/2017 FINDINGS: Cardiovascular: Heart is normal size. Extensive calcifications throughout the aorta which is  normal caliber. No dissection. Mediastinum/Nodes: No adenopathy in the visualized lower mediastinum or hila. Calcified left hilar lymph nodes. Lungs/Pleura: Visualized lungs are clear.  No effusions. Upper Abdomen: Imaging into the upper abdomen shows no acute findings. Musculoskeletal: No acute bony abnormality. Chest wall soft tissues unremarkable. Prior median sternotomy and CABG. IMPRESSION: No acute or significant extracardiac abnormality. Electronically Signed: By: Rolm Baptise M.D. On: 06/11/2017 14:41   Ct Angio Chest Aorta W &/or Wo Contrast  Result Date: 06/03/2017 CLINICAL DATA:  83 year old male with history of severe aortic stenosis. Preprocedural study prior to potential transcatheter aortic valve replacement (TAVR) procedure. EXAM: CT ANGIOGRAPHY CHEST, ABDOMEN AND PELVIS TECHNIQUE: Multidetector CT imaging through the chest, abdomen and pelvis was performed using the standard protocol during bolus administration of intravenous contrast. Multiplanar reconstructed images and MIPs were obtained and reviewed to evaluate the vascular anatomy. CONTRAST:  80 mL of Isovue 370. COMPARISON:  CT the abdomen and pelvis 11/10/2016. FINDINGS: CTA CHEST FINDINGS Cardiovascular: Heart size is normal. There is no significant pericardial fluid, thickening or pericardial calcification. There is aortic atherosclerosis, as well as atherosclerosis of the great vessels of the mediastinum and the coronary arteries, including calcified  atherosclerotic plaque in the left main, left anterior descending, left circumflex and right coronary arteries. Status post median sternotomy for CABG including LIMA to the LAD. Severe thickening calcification of the aortic valve. Calcification of the mitral valve and mitral annulus. Mediastinum/Lymph Nodes: No pathologically enlarged mediastinal or hilar lymph nodes. Esophagus is unremarkable in appearance. No axillary lymphadenopathy. Lungs/Pleura: 6 mm subpleural nodule in the posterior aspect of the right upper lobe (axial image 65 of series 6). No acute consolidative airspace disease. No pleural effusions. Musculoskeletal/Soft Tissues: Median sternotomy wires. There are no aggressive appearing lytic or blastic lesions noted in the visualized portions of the skeleton. CTA ABDOMEN AND PELVIS FINDINGS Hepatobiliary: No cystic or solid hepatic lesions. No intra or extrahepatic biliary ductal dilatation. Gallbladder is normal in appearance. Pancreas: No pancreatic mass. No pancreatic ductal dilatation. No pancreatic or peripancreatic fluid or inflammatory changes. Spleen: Small calcified granulomas in the spleen. Adrenals/Urinary Tract: Small nonobstructive calculi are present within the collecting systems of the kidneys bilaterally, largest of which measures 3 mm in the interpolar region of the right kidney. Mild atrophy and multifocal cortical thinning in the kidneys bilaterally. Multiple subcentimeter low-attenuation lesions in the kidneys bilaterally, too small to characterize, but favored to represent cysts. In addition, there is an exophytic low-attenuation lesion in the lower pole of the right kidney, compatible with a simple cyst. No hydroureteronephrosis. Urinary bladder is normal in appearance. Bilateral adrenal glands are normal in appearance. Stomach/Bowel: The appearance of the stomach is normal. There is no pathologic dilatation of small bowel or colon. Numerous colonic diverticulae are noted, without  surrounding inflammatory changes to suggest an acute diverticulitis at this time. The appendix is not confidently identified and may be surgically absent. Regardless, there are no inflammatory changes noted adjacent to the cecum to suggest the presence of an acute appendicitis at this time. Vascular/Lymphatic: Extensive aortic atherosclerosis throughout the abdominal and pelvic vasculature, with vascular findings and measurements pertinent to potential TAVR procedure, as detailed below. No aneurysm or dissection noted in the abdomen or pelvis. Celiac axis, superior mesenteric artery and inferior mesenteric artery are all widely patent without hemodynamically significant stenosis. Bilateral renal arteries are widely patent. No lymphadenopathy noted in the abdomen or pelvis. Reproductive: Numerous brachytherapy implants are noted throughout the prostate gland. Seminal vesicles are unremarkable  in appearance. Other: 2 tiny supraumbilical ventral hernias containing only omental fat. No significant volume of ascites. No pneumoperitoneum. Musculoskeletal: There are no aggressive appearing lytic or blastic lesions noted in the visualized portions of the skeleton. VASCULAR MEASUREMENTS PERTINENT TO TAVR: AORTA: Minimal Aortic Diameter -  13 x 13 mm Severity of Aortic Calcification -  severe RIGHT PELVIS: Right Common Iliac Artery - Minimal Diameter - 3.9 x 3.7 mm Tortuosity - mild Calcification - severe Right External Iliac Artery - Minimal Diameter - 7.1 x 6.9 mm Tortuosity - mild Calcification - mild to moderate Right Common Femoral Artery - Minimal Diameter - 8.8 x 5.8 mm Tortuosity - mild Calcification - moderate LEFT PELVIS: Left Common Iliac Artery - Minimal Diameter - 8.1 x 8.0 mm Tortuosity - moderate Calcification - moderate Left External Iliac Artery - Minimal Diameter - 7.8 x 7.7 mm Tortuosity - moderate Calcification - mild to moderate Left Common Femoral Artery - Minimal Diameter - 9.0 x 8.1 mm Tortuosity -  mild Calcification - moderate Review of the MIP images confirms the above findings. IMPRESSION: 1. Vascular findings and measurements pertinent to potential TAVR procedure, as detailed above. The patient has suitable pelvic arterial access on the left side. 2. Severe thickening calcification of the aortic valve, compatible with the reported clinical history of severe aortic stenosis. 3. Colonic diverticulosis without evidence of acute diverticulitis at this time. 4. 6 mm subpleural nodule in the posterior aspect of the right upper lobe. Non-contrast chest CT at 6-12 months is recommended. If the nodule is stable at time of repeat CT, then future CT at 18-24 months (from today's scan) is considered optional for low-risk patients, but is recommended for high-risk patients. This recommendation follows the consensus statement: Guidelines for Management of Incidental Pulmonary Nodules Detected on CT Images: From the Fleischner Society 2017; Radiology 2017; 284:228-243. 5. Additional incidental findings, as above. Electronically Signed   By: Vinnie Langton M.D.   On: 06/03/2017 15:48   Ct Angio Abd/pel W/ And/or W/o  Result Date: 06/03/2017 CLINICAL DATA:  83 year old male with history of severe aortic stenosis. Preprocedural study prior to potential transcatheter aortic valve replacement (TAVR) procedure. EXAM: CT ANGIOGRAPHY CHEST, ABDOMEN AND PELVIS TECHNIQUE: Multidetector CT imaging through the chest, abdomen and pelvis was performed using the standard protocol during bolus administration of intravenous contrast. Multiplanar reconstructed images and MIPs were obtained and reviewed to evaluate the vascular anatomy. CONTRAST:  80 mL of Isovue 370. COMPARISON:  CT the abdomen and pelvis 11/10/2016. FINDINGS: CTA CHEST FINDINGS Cardiovascular: Heart size is normal. There is no significant pericardial fluid, thickening or pericardial calcification. There is aortic atherosclerosis, as well as atherosclerosis of the  great vessels of the mediastinum and the coronary arteries, including calcified atherosclerotic plaque in the left main, left anterior descending, left circumflex and right coronary arteries. Status post median sternotomy for CABG including LIMA to the LAD. Severe thickening calcification of the aortic valve. Calcification of the mitral valve and mitral annulus. Mediastinum/Lymph Nodes: No pathologically enlarged mediastinal or hilar lymph nodes. Esophagus is unremarkable in appearance. No axillary lymphadenopathy. Lungs/Pleura: 6 mm subpleural nodule in the posterior aspect of the right upper lobe (axial image 65 of series 6). No acute consolidative airspace disease. No pleural effusions. Musculoskeletal/Soft Tissues: Median sternotomy wires. There are no aggressive appearing lytic or blastic lesions noted in the visualized portions of the skeleton. CTA ABDOMEN AND PELVIS FINDINGS Hepatobiliary: No cystic or solid hepatic lesions. No intra or extrahepatic biliary ductal dilatation. Gallbladder is  normal in appearance. Pancreas: No pancreatic mass. No pancreatic ductal dilatation. No pancreatic or peripancreatic fluid or inflammatory changes. Spleen: Small calcified granulomas in the spleen. Adrenals/Urinary Tract: Small nonobstructive calculi are present within the collecting systems of the kidneys bilaterally, largest of which measures 3 mm in the interpolar region of the right kidney. Mild atrophy and multifocal cortical thinning in the kidneys bilaterally. Multiple subcentimeter low-attenuation lesions in the kidneys bilaterally, too small to characterize, but favored to represent cysts. In addition, there is an exophytic low-attenuation lesion in the lower pole of the right kidney, compatible with a simple cyst. No hydroureteronephrosis. Urinary bladder is normal in appearance. Bilateral adrenal glands are normal in appearance. Stomach/Bowel: The appearance of the stomach is normal. There is no pathologic  dilatation of small bowel or colon. Numerous colonic diverticulae are noted, without surrounding inflammatory changes to suggest an acute diverticulitis at this time. The appendix is not confidently identified and may be surgically absent. Regardless, there are no inflammatory changes noted adjacent to the cecum to suggest the presence of an acute appendicitis at this time. Vascular/Lymphatic: Extensive aortic atherosclerosis throughout the abdominal and pelvic vasculature, with vascular findings and measurements pertinent to potential TAVR procedure, as detailed below. No aneurysm or dissection noted in the abdomen or pelvis. Celiac axis, superior mesenteric artery and inferior mesenteric artery are all widely patent without hemodynamically significant stenosis. Bilateral renal arteries are widely patent. No lymphadenopathy noted in the abdomen or pelvis. Reproductive: Numerous brachytherapy implants are noted throughout the prostate gland. Seminal vesicles are unremarkable in appearance. Other: 2 tiny supraumbilical ventral hernias containing only omental fat. No significant volume of ascites. No pneumoperitoneum. Musculoskeletal: There are no aggressive appearing lytic or blastic lesions noted in the visualized portions of the skeleton. VASCULAR MEASUREMENTS PERTINENT TO TAVR: AORTA: Minimal Aortic Diameter -  13 x 13 mm Severity of Aortic Calcification -  severe RIGHT PELVIS: Right Common Iliac Artery - Minimal Diameter - 3.9 x 3.7 mm Tortuosity - mild Calcification - severe Right External Iliac Artery - Minimal Diameter - 7.1 x 6.9 mm Tortuosity - mild Calcification - mild to moderate Right Common Femoral Artery - Minimal Diameter - 8.8 x 5.8 mm Tortuosity - mild Calcification - moderate LEFT PELVIS: Left Common Iliac Artery - Minimal Diameter - 8.1 x 8.0 mm Tortuosity - moderate Calcification - moderate Left External Iliac Artery - Minimal Diameter - 7.8 x 7.7 mm Tortuosity - moderate Calcification - mild to  moderate Left Common Femoral Artery - Minimal Diameter - 9.0 x 8.1 mm Tortuosity - mild Calcification - moderate Review of the MIP images confirms the above findings. IMPRESSION: 1. Vascular findings and measurements pertinent to potential TAVR procedure, as detailed above. The patient has suitable pelvic arterial access on the left side. 2. Severe thickening calcification of the aortic valve, compatible with the reported clinical history of severe aortic stenosis. 3. Colonic diverticulosis without evidence of acute diverticulitis at this time. 4. 6 mm subpleural nodule in the posterior aspect of the right upper lobe. Non-contrast chest CT at 6-12 months is recommended. If the nodule is stable at time of repeat CT, then future CT at 18-24 months (from today's scan) is considered optional for low-risk patients, but is recommended for high-risk patients. This recommendation follows the consensus statement: Guidelines for Management of Incidental Pulmonary Nodules Detected on CT Images: From the Fleischner Society 2017; Radiology 2017; 284:228-243. 5. Additional incidental findings, as above. Electronically Signed   By: Vinnie Langton M.D.   On:  06/03/2017 15:48     I have independently reviewed the above radiologic studies.  Recent Lab Findings: Lab Results  Component Value Date   WBC 8.5 10/29/2016   HGB 12.4 (L) 10/29/2016   HCT 37.0 (L) 10/29/2016   PLT 221 10/29/2016   GLUCOSE 110 (H) 06/09/2017   ALT 17 10/25/2015   AST 26 10/25/2015   NA 140 06/09/2017   K 4.2 06/09/2017   CL 104 06/09/2017   CREATININE 1.70 (H) 06/09/2017   BUN 24 (H) 06/09/2017   CO2 31 06/09/2017   INR 1.0 10/29/2016   HGBA1C 8.1 (H) 10/26/2015   Procedures   Right/Left Heart Cath and Coronary/Graft Angiography  Conclusion    Severe native vessel coronary disease with total occlusion of the left main and native right coronary.  Bypass graft failure with occlusion of the saphenous vein graft to the ramus  intermedius.  Widely patent saphenous vein graft to the PDA and PL branch.  Widely patent LIMA to the LAD  Normal pulmonary artery pressures including capillary wedge.   Indications   Acute systolic heart failure (HCC) [I50.21 (ICD-10-CM)]  Coronary artery disease involving autologous vein coronary bypass graft without angina pectoris [I25.810 (ICD-10-CM)]  Procedural Details/Technique   Technical Details The left radial area was sterilely prepped and draped. Intravenous sedation with Versed and fentanyl was administered. 1% Xylocaine was infiltrated to achieve local analgesia. A double wall stick with an angiocath was utilized to obtain intra-arterial access. The modified Seldinger technique was used to place a 65F " Slender" sheath in the left radial artery. Weight based heparin was administered. Coronary angiography was done using 5 F catheters. Right coronary angiography was performed with a JR4. Left ventricular hemodymic recordings were not possible. We used multiple catheters in an attempt to cross the aortic valve but was unsuccessful. There was inability to free torque control due to the angulation from the subclavian into the aorta. Left coronary angiography was performed with a JL 3.5 cm. Left ventriculography was not attempted due to CKD.  Right heart catheterization was performed by exchanging an antecubital angiocath IV for a 5 French brachial sheath. 1% Xylocaine local infiltration at the IV site was given prior to sheath exchange. A double glove technique was used. The modified Seldinger technique was employed. After sheath insertion, right heart cath was performed using a 5 French balloon tipped catheter. Pressures were recorded in each chamber. A main pulmonary artery O2 saturation was obtained. I wedge pressure was also measured.   Hemostasis was achieved using a pneumatic band.  During this procedure the patient is administered a total of Versed 2 mg and Fentanyl 50 mg to  achieve and maintain moderate conscious sedation. The patient's heart rate, blood pressure, and oxygen saturation are monitored continuously during the procedure. The period of conscious sedation is 46 minutes, of which I was present face-to-face 100% of this time.   Estimated blood loss <50 mL.  During this procedure the patient was administered the following to achieve and maintain moderate conscious sedation: Versed 2 mg, Fentanyl 50 mcg, while the patient's heart rate, blood pressure, and oxygen saturation were continuously monitored. The period of conscious sedation was 46 minutes, of which I was present face-to-face 100% of this time.    Coronary Findings   Dominance: Right  Left Main  LM lesion, 100% stenosed.  Left Anterior Descending  First Diagonal Branch  Ost 1st Diag lesion, 100% stenosed.  Second Diagonal Branch  Vessel is small in size.  Third  Diagonal Branch  Vessel is small in size.  Right Coronary Artery  Prox RCA lesion, 100% stenosed.  Mid RCA to Dist RCA lesion, 100% stenosed.  Right Posterior Descending Artery  Vessel is small in size.  First Right Posterolateral  Vessel is small in size.  Graft Angiography  Free LIMA Graft to Dist LAD  LIMA.  Graft to Post Atrio  and is very large.  saphenous Graft to Ramus  SVG.  Prox Graft lesion, 100% stenosed.  Right Heart   Right Heart Pressures LV EDP is normal. Right heart pressures were normal.    Left Heart   Left Ventricle LVEF known to be 25% by echocardiography. Contrast ventriculography was not performed due to inability to cross aortic valve from the left arm approach and chronic kidney disease.    Coronary Diagrams   Diagnostic Diagram       Implants     No implant documentation for this case   ECHO:  Result status: Final result                           [name]                        [address], [place] [address]                             [phone]  ------------------------------------------------------------------- Stress Echocardiography  Patient:    [name], [name] MR #:       [id number] Study Date: 04/26/2017 Gender:     M Age:        83 Height:     167.6 cm Weight:     89.4 kg BSA:        2.07 m^2 Pt. Status: Room:   ATTENDING    Belva Crome, MD  Livia Snellen     Belva Crome, MD  [place], MD  SONOGRAPHER  Cindy Hazy, RDCS  PERFORMING   [facility], Outpatient  cc:  ------------------------------------------------------------------- LV EF: 20% -   25%  ------------------------------------------------------------------- Indications:      I35.9 Aortic Valve Disorder.  ASSESS FOR LOW GRADIENT AORTIC STENOSIS.  ------------------------------------------------------------------- History:   PMH:  CAD. Myocardial Infarction.  Risk factors: Hypertension. Diabetes mellitus. Dyslipidemia.  ------------------------------------------------------------------- Study Conclusions  - Left ventricle: The cavity size was mildly dilated. There was   mild concentric hypertrophy. Systolic function was severely   reduced. The estimated ejection fraction was in the range of 20%   to 25%. Diffuse hypokinesis. - Aortic valve: Valve mobility was restricted. There was severe   stenosis. There was trivial regurgitation. - Left atrium: The atrium was mildly dilated. - Stress ECG conclusions: The stress ECG was normal.  Impressions:  - LV function severely depressed at baseline which improved with   dobutamine; mean gradient 25 mmHg at baseline and increased to 41   mmHg with dobutamine; AVA 0.8 cm 2 at baseline and with peak   infusion; dimensionless index 0.2 at baseline and peak infusion;   findings are consistent with severe AS.  ------------------------------------------------------------------- Study data:   Study status:  Routine.  Consent:  The risks, benefits, and  alternatives to the procedure were explained to the  patient and informed consent was obtained.  Procedure:  The patient reported no pain pre or post test. Initial setup. The patient was brought to the laboratory. A baseline ECG was recorded. Surface ECG leads and automatic cuff blood pressure measurements were monitored.  Dobutamine stress test. Stress testing was performed, with dobutamine infusion from 5 to 20 mcg/kg/min by 5 mcg/kg/min increments. The infusion was terminated due to maximal dose administration. Transthoracic stress echocardiography for left ventricular function evaluation and assessment of valvular function. Images were captured at baseline, low dose, peak dose, and recovery.  Study completion:  The patient tolerated the procedure well. There were no complications.          Dobutamine. Stress echocardiography.  2D.  Birthdate:  Patient birthdate: [birth year].  Age:  Patient is 83 yr old.  Sex:  Gender: male. BMI: 31.8 kg/m^2.  Blood pressure:     112/66  Patient status: Outpatient.  Study date:  Study date: 04/26/2017. Study time: 02:24 PM.  -------------------------------------------------------------------  ------------------------------------------------------------------- Left ventricle:  The cavity size was mildly dilated. There was mild concentric hypertrophy. Systolic function was severely reduced. The estimated ejection fraction was in the range of 20% to 25%. Diffuse hypokinesis.  ------------------------------------------------------------------- Aortic valve:   Trileaflet; severely calcified leaflets. Valve mobility was restricted.  Doppler:   There was severe stenosis. There was trivial regurgitation.    VTI ratio of LVOT to aortic valve: 0.26. Valve area (VTI): 0.99 cm^2. Indexed valve area (VTI): 0.48 cm^2/m^2. Peak velocity ratio of LVOT to aortic valve: 0.22. Valve area (Vmax): 0.85 cm^2. Indexed valve area (Vmax): 0.41 cm^2/m^2. Mean velocity  ratio of LVOT to aortic valve: 0.22. Valve area (Vmean): 0.85 cm^2. Indexed valve area (Vmean): 0.41 cm^2/m^2.    Mean gradient (S): 28 mm Hg. Peak gradient (S): 51 mm Hg.   ------------------------------------------------------------------- Left atrium:  The atrium was mildly dilated.  ------------------------------------------------------------------- Right ventricle:  The cavity size was normal. Systolic function was normal.  ------------------------------------------------------------------- Right atrium:  The atrium was normal in size.  ------------------------------------------------------------------- Baseline ECG:  Normal sinus rhythm, septal MI, nonspecific ST changes.  ------------------------------------------------------------------- Stress protocol:  +-----------------------+---+------------+--------+ !Stage                  !HR !BP (mmHg)   !Symptoms! +-----------------------+---+------------+--------+ !Baseline               !85 !112/66 (81) !None    ! +-----------------------+---+------------+--------+ !Dobutamine 5 ug/kg/min !82 !137/70 (92) !None    ! +-----------------------+---+------------+--------+ !Dobutamine 10 ug/kg/min!82 !141/69 (93) !None    ! +-----------------------+---+------------+--------+ !Dobutamine 20 ug/kg/min!89 !152/69 (97) !None    ! +-----------------------+---+------------+--------+ !Immediate post stress  !80 !------------!None    ! +-----------------------+---+------------+--------+ !Recovery; 1 min        !80 !159/70 (100)!None    ! +-----------------------+---+------------+--------+ !Recovery; 2 min        !75 !------------!None    ! +-----------------------+---+------------+--------+ !Recovery; 3 min        !90 !158/72 (101)!None    ! +-----------------------+---+------------+--------+ !Recovery; 4 min        !100!------------!None    ! +-----------------------+---+------------+--------+ !Recovery; 5 min        !96 !113/70  (84) !None    ! +-----------------------+---+------------+--------+  ------------------------------------------------------------------- Stress results:   Maximal heart rate during stress was 100 bpm (71% of maximal predicted heart rate). The maximal predicted heart rate was 140 bpm.The target heart rate was achieved. The heart rate response to stress was normal. There was a normal resting blood pressure. Normal blood pressure response  to dobutamine. The rate-pressure product for the peak heart rate and blood pressure was 14220 mm Hg/min.  The patient experienced no chest pain during stress.  ------------------------------------------------------------------- Stress ECG:   The stress ECG was normal.  ------------------------------------------------------------------- Baseline:  - LV size was mildly enlarged. - LV global systolic function was severely depressed. The estimated   LV ejection fraction was 20%. - There was diffuse LV hypokinesis. LVOT VTI 60 cm/s; AV VTI 305   cm/s; Mean gradient 25 mmHg; AVA 0.8 cm2; DI 0.2.  Low dose: 10 micrograms/Kg/min-LVOT VTI 60 cm/s; AV VTI 360 cm/s; mean gradient 37 mmHg; AVA 0.63 cm2; DI 0.17 cm2. Peak stress:  - LVOT VTI 76 cm/2; AV VTI 384 cm2; mean gradient 41 mmHg: AVA 0.8   cm2; DI 0.2. - LV global systolic function was mildly to moderately depressed.   The estimated LV ejection fraction was 40-45%.  Recovery:  ------------------------------------------------------------------- Measurements   Left ventricle                            Value          Reference  LV ID, ED, PLAX chordal           (H)     55.7  mm       43 - 52  LV ID, ES, PLAX chordal           (H)     44.8  mm       23 - 38  LV fx shortening, PLAX chordal    (L)     20    %        >=29  LV PW thickness, ED                       11.9  mm       ---------  IVS/LV PW ratio, ED                       1.02           <=1.3  Stroke volume, 2D                          50    ml       ---------  Stroke volume/bsa, 2D                     24    ml/m^2   ---------    Ventricular septum                        Value          Reference  IVS thickness, ED                         12.1  mm       ---------    LVOT                                      Value          Reference  LVOT ID, S  22    mm       ---------  LVOT area                                 3.8   cm^2     ---------  LVOT peak velocity, S                     79.8  cm/s     ---------  LVOT mean velocity, S                     56.3  cm/s     ---------  LVOT VTI, S                               13.1  cm       ---------  LVOT peak gradient, S                     3     mm Hg    ---------    Aortic valve                              Value          Reference  Aortic valve peak velocity, S             358   cm/s     ---------  Aortic valve mean velocity, S             252   cm/s     ---------  Aortic valve VTI, S                       50.5  cm       ---------  Aortic mean gradient, S                   28    mm Hg    ---------  Aortic peak gradient, S                   51    mm Hg    ---------  VTI ratio, LVOT/AV                        0.26           ---------  Aortic valve area, VTI                    0.99  cm^2     ---------  Aortic valve area/bsa, VTI                0.48  cm^2/m^2 ---------  Velocity ratio, peak, LVOT/AV             0.22           ---------  Aortic valve area, peak velocity          0.85  cm^2     ---------  Aortic valve area/bsa, peak               0.41  cm^2/m^2 ---------  velocity  Velocity ratio, mean, LVOT/AV             0.22           ---------  Aortic valve area, mean velocity          0.85  cm^2     ---------  Aortic valve area/bsa, mean               0.41  cm^2/m^2 ---------  velocity    Aorta                                     Value          Reference  Aortic root ID, ED                        35    mm       ---------    Left atrium                                Value          Reference  LA ID, A-P, ES                            46    mm       ---------  LA ID/bsa, A-P                    (H)     2.22  cm/m^2   <=2.2  Legend: (L)  and  (H)  mark values outside specified reference range.  ------------------------------------------------------------------- Prepared and Electronically Authenticated by  Kirk Ruths 2018-05-07T16:25:31     Assessment / Plan:   1/ Progressive severe stage D aortic stenosis associated with severe LV dysfunction  2/ coronary artery disease with some aneurysmal degeneration of the sequential vein graft to the distal right and posterior lateral branches, the internal mammary artery is large and patent, left main is totally occluded   The patient has significant comorbidities I would make standard redo aortic valve coronary artery bypass grafting high risk in nature with expected prolonged recovery. I agree with his progressive symptoms of critical aortic stenosis [place] of TVAR offers him the best chance of symptomatic relief at the lowest risk.   I  spent 30 minutes counseling the patient face to face and 50% or more the  time was spent in counseling and coordination of care. The total time spent in the appointment was 40 minutes.  Grace Isaac MD      [address] ,[place] [address] Office [phone]   Beeper [phone]  06/15/2017 4:15 PM

## 2017-06-15 NOTE — Therapy (Signed)
[place], [place], [address] Phone: [phone]   Fax:  [phone]  Physical Therapy Evaluation  Patient Details  Name: [name] MRN: [id number] Date of Birth: 05-Oct-1934 Referring Provider: Dr. Sherren Mocha  Encounter Date: 06/15/2017      PT End of Session - 06/15/17 1434    Visit Number 1   PT Start Time 1430   PT Stop Time 1505   PT Time Calculation (min) 35 min   Equipment Utilized During Treatment Gait belt      Past Medical History:  Diagnosis Date  . Arthritis   . Azotemia   . Cancer [facility]) 2010   prostate  . Chondromalacia   . Coronary artery disease   . Diabetes mellitus without complication ([place])   . GERD (gastroesophageal reflux disease)   . Glaucoma    "high normal"  . Gout   . H/O blood clots    R Lower leg, right upper leg  . Heel spur   . Hyperlipidemia   . Hypertension   . Joint pain   . Lesion of left native kidney   . Low back pain    "severe"  . Mild aortic stenosis   . Myocardial infarction [facility])    "told me I had a small heart attack"  . Obesity   . Osteopenia   . Osteoporosis   . Polymyalgia ([place])    h/o  . Renal cyst, left   . Shingles    LEFT ABDOMEN  . Spinal stenosis   . Thrombophlebitis   . Tinnitus   . Tubular adenoma     Past Surgical History:  Procedure Laterality Date  . APPENDECTOMY  60 years ago  . CARDIAC CATHETERIZATION    . CARDIAC CATHETERIZATION N/A 11/03/2016   Procedure: Right/Left Heart Cath and Coronary/Graft Angiography;  Surgeon: Belva Crome, MD;  Location: [facility];  Service: Cardiovascular;  Laterality: N/A;  . CORONARY ARTERY BYPASS GRAFT  2007  . PROSTATE SURGERY     seed implant  . ROBOTIC ASSITED PARTIAL NEPHRECTOMY Left 08/31/2013   Procedure: ROBOTIC ASSITED PARTIAL NEPHRECTOMY;  Surgeon: Dutch Gray, MD;  Location: [facility];  Service: Urology;  Laterality: Left;  Marland Kitchen VEIN LIGATION AND STRIPPING Right  1972   right lower leg    There were no vitals filed for this visit.       Subjective Assessment - 06/15/17 1435    Subjective Pt reports progressive fatigue and shortness of breath activity - walking distances as short as 200-300'. Pt reports slight improvement since beginning fluid pills. Still really noticed with stairs and inclines.    Patient Stated Goals to fix heart   Currently in Pain? No/denies            [facility] PT Assessment - 06/15/17 0001      Assessment   Medical Diagnosis severe aortic stenosis   Referring Provider Dr. Sherren Mocha   Onset Date/Surgical Date --  approximately 6 months ago     Precautions   Precautions None     Restrictions   Weight Bearing Restrictions No     Balance Screen   Has the patient fallen in the past 6 months No   Has the patient had a decrease in activity level because of a fear of falling?  No   Is the patient reluctant to leave their home because of a fear of falling?  No     Home Environment  Living Environment Private residence   Living Arrangements Spouse/significant other   Home Access Stairs to enter   Entrance Stairs-Number of Steps 3   Entrance Stairs-Rails None  grabs Bell One level     Prior Function   Level of [place] Independent with community mobility without device     Posture/Postural Control   Posture/Postural Control Postural limitations   Postural Limitations Forward head;Rounded Shoulders     ROM / Strength   AROM / PROM / Strength AROM;Strength     AROM   Overall AROM Comments grossly WNL throughout     Strength   Overall Strength Comments grossly 4+/5 throughout   Strength Assessment Site Hand   Right/Left hand Right;Left   Right Hand Grip (lbs) 54  R hand dominant   Left Hand Grip (lbs) 56     Ambulation/Gait   Gait Comments Pt ambulates without assistive device. He demonstrates decrease foot clearance bil and decr. step length bil. Gait distance is limited by 63%  for age/gender due to shortness of breath.           [facility] Pre-Surgical Assessment - 06/15/17 0001    5 Meter Walk Test- trial 1 7 sec   5 Meter Walk Test- trial 2 6 sec.    5 Meter Walk Test- trial 3 6 sec.   5 meter walk test average 6.33 sec   4 Stage Balance Test tolerated for:  4 sec.   4 Stage Balance Test Position 4   Sit To Stand Test- trial 1 12 sec.   ADL/IADL Independent with: Bathing;Dressing;Meal prep;Finances   ADL/IADL Needs Assistance with: Valla Leaver work   ADL/IADL Fraility Index Vulnerable   6 Minute Walk- Baseline yes   BP (mmHg) 139/67   HR (bpm) 64   02 Sat (%RA) 96 %   Modified Borg Scale for Dyspnea 0- Nothing at all   Perceived Rate of Exertion (Borg) 6-   6 Minute Walk Post Test yes   BP (mmHg) 134/70   HR (bpm) 119   02 Sat (%RA) 96 %   Modified Borg Scale for Dyspnea 4- somewhat severe   Perceived Rate of Exertion (Borg) 11- Fairly light   Aerobic Endurance Distance Walked 635          Objective measurements completed on examination: See above findings.                               Plan - 06/15/17 1517    Clinical Impression Statement see below   PT Frequency One time visit   Consulted and Agree with Plan of Care Patient     Clinical Impression Statement: Pt is an 83 yo male presenting to OP PT for evaluation prior to possible TAVR surgery due to severe aortic stenosis. Pt reports onset of shortness of breath with activity approximately 6 months ago. Symptoms are limiting walking any distance or up inclines and climbing stairs. Pt reports also limited with working and is very sedentary now. Pt presents with good ROM and strength, good balance and is not at high fall risk 4 stage balance test, good walking speed and poor aerobic endurance per 6 minute walk test. Pt ambulated 370 feet in 2:30 before requesting a seated rest beak lasting 1:30 due to shortness of breath.  Pt ambulated a total of 635 feet in 6 minute walk. Based  on the Short Physical Performance Battery, patient has a  frailty rating of 11/12 with </= 5/12 considered frail.   Patient demonstrated the following deficits and impairments:     Visit Diagnosis: Other abnormalities of gait and mobility  Abnormal posture      G-Codes - 06-21-2017 1517    Functional Assessment Tool Used (Outpatient Only) 6 minute walk 635'   Functional Limitation Mobility: Walking and moving around   Mobility: Walking and Moving Around Current Status [phone]) At least 60 percent but less than 80 percent impaired, limited or restricted   Mobility: Walking and Moving Around Goal Status [phone]) At least 60 percent but less than 80 percent impaired, limited or restricted   Mobility: Walking and Moving Around Discharge Status [phone]) At least 60 percent but less than 80 percent impaired, limited or restricted       Problem List Patient Active Problem List   Diagnosis Date Noted  . Cardiomyopathy ([place])   . Acute systolic heart failure ([place]) 11/02/2016  . Discitis 12/22/2015  . 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 CKD (chronic kidney disease), stage III 10/26/2015  . Essential hypertension 02/11/2015  . Hyperlipidemia   . Diabetes mellitus without complication ([place])   . Myocardial infarction   . GERD (gastroesophageal reflux disease)   . Cancer ([place])   . Arthritis   . H/O blood clots   . Coronary artery disease involving coronary bypass graft of native heart without angina pectoris   . Low back pain   . Polymyalgia ([place])   . Gout   . Glaucoma     Anthony, PT 2017/06/21, 3:18 PM  [facility] [address] [place], [place], [address] Phone: [phone]   Fax:  [phone]  Name: Jonavin Seder MRN: [id number] Date of Birth: 06/05/1934

## 2017-06-15 NOTE — Telephone Encounter (Signed)
Patient made aware to follow up with PCP regarding this issue.  Renee Pain, RN

## 2017-06-16 ENCOUNTER — Other Ambulatory Visit (HOSPITAL_COMMUNITY): Payer: PPO

## 2017-06-16 ENCOUNTER — Other Ambulatory Visit: Payer: Self-pay | Admitting: *Deleted

## 2017-06-16 DIAGNOSIS — I350 Nonrheumatic aortic (valve) stenosis: Secondary | ICD-10-CM

## 2017-06-16 NOTE — Pre-Procedure Instructions (Addendum)
[name]  06/16/2017      [facility] - [place] - [address] [place] [address] Phone: [phone] Fax: [phone]  [place], [place] [address] [place] [address] Phone: [phone] Fax: [phone]    Your procedure is scheduled on July 3  Report to [place] at [place].M.  Call this number if you have problems the morning of surgery:  [phone]   Remember:  Do not eat food or drink liquids after midnight.   Take these medicines the morning of surgery with A SIP OF WATER allopurinol (ZYLOPRIM), HYDROcodone-acetaminophen (Americus) if needed, tamsulosin [facility])  Follow your doctors instructions regarding your Aspirin.  If no instructions were given by the doctor you will need to call the office to get instructions.  Your pre admission RN will also call for those instructions   7 days prior to surgery STOP taking any  Aleve, Naproxen, Ibuprofen, Motrin, Advil, Goody's, BC's, all herbal medications, fish oil, and all vitamins       How to Manage Your Diabetes Before and After Surgery  Why is it important to control my blood sugar before and after surgery? . Improving blood sugar levels before and after surgery helps healing and can limit problems. . A way of improving blood sugar control is eating a healthy diet by: o  Eating less sugar and carbohydrates o  Increasing activity/exercise o  Talking with your doctor about reaching your blood sugar goals . High blood sugars (greater than 180 mg/dL) can raise your risk of infections and slow your recovery, so you will need to focus on controlling your diabetes during the weeks before surgery. . Make sure that the doctor who takes care of your diabetes knows about your planned surgery including the date and location.  How do I manage my blood sugar before  surgery? . Check your blood sugar at least 4 times a day, starting 2 days before surgery, to make sure that the level is not too high or low. o Check your blood sugar the morning of your surgery when you wake up and every 2 hours until you get to the Short Stay unit. . If your blood sugar is less than 70 mg/dL, you will need to treat for low blood sugar: o Do not take insulin. o Treat a low blood sugar (less than 70 mg/dL) with  cup of clear juice (cranberry or apple), 4 glucose tablets, OR glucose gel. o Recheck blood sugar in 15 minutes after treatment (to make sure it is greater than 70 mg/dL). If your blood sugar is not greater than 70 mg/dL on recheck, call [phone] for further instructions. . Report your blood sugar to the short stay nurse when you get to Short Stay.  . If you are admitted to the hospital after surgery: o Your blood sugar will be checked by the staff and you will probably be given insulin after surgery (instead of oral diabetes medicines) to make sure you have good blood sugar levels. o The goal for blood sugar control after surgery is 80-180 mg/dL.              WHAT DO I DO ABOUT MY DIABETES MEDICATION?    Marland Kitchen Do not take oral diabetes medicines (pills) the morning of surgery. glipiZIDE (GLUCOTROL)   DO NOT TAKE EVENING DOSE OF glipiZIDE (GLUCOTROL)  Do not wear jewelry  Do not wear lotions, powders, or cologne, or deoderant.  Men may shave face and neck.  Do not bring valuables to the hospital.  [facility] is not responsible for any belongings or valuables.  Contacts, dentures or bridgework may not be worn into surgery.  Leave your suitcase in the car.  After surgery it may be brought to your room.  For patients admitted to the hospital, discharge time will be determined by your treatment team.  Patients discharged the day of surgery will not be allowed to drive home.    Special instructions:   [place]- Preparing For  Surgery  Before surgery, you can play an important role. Because skin is not sterile, your skin needs to be as free of germs as possible. You can reduce the number of germs on your skin by washing with CHG (chlorahexidine gluconate) Soap before surgery.  CHG is an antiseptic cleaner which kills germs and bonds with the skin to continue killing germs even after washing.  Please do not use if you have an allergy to CHG or antibacterial soaps. If your skin becomes reddened/irritated stop using the CHG.  Do not shave (including legs and underarms) for at least 48 hours prior to first CHG shower. It is OK to shave your face.  Please follow these instructions carefully.   1. Shower the NIGHT BEFORE SURGERY and the MORNING OF SURGERY with CHG.   2. If you chose to wash your hair, wash your hair first as usual with your normal shampoo.  3. After you shampoo, rinse your hair and body thoroughly to remove the shampoo.  4. Use CHG as you would any other liquid soap. You can apply CHG directly to the skin and wash gently with a scrungie or a clean washcloth.   5. Apply the CHG Soap to your body ONLY FROM THE NECK DOWN.  Do not use on open wounds or open sores. Avoid contact with your eyes, ears, mouth and genitals (private parts). Wash genitals (private parts) with your normal soap.  6. Wash thoroughly, paying special attention to the area where your surgery will be performed.  7. Thoroughly rinse your body with warm water from the neck down.  8. DO NOT shower/wash with your normal soap after using and rinsing off the CHG Soap.  9. Pat yourself dry with a CLEAN TOWEL.   10. Wear CLEAN PAJAMAS   11. Place CLEAN SHEETS on your bed the night of your first shower and DO NOT SLEEP WITH PETS.    Day of Surgery: Do not apply any deodorants/lotions. Please wear clean clothes to the hospital/surgery center.      Please read over the following fact sheets that you were given.

## 2017-06-17 ENCOUNTER — Institutional Professional Consult (permissible substitution) (INDEPENDENT_AMBULATORY_CARE_PROVIDER_SITE_OTHER): Payer: PPO | Admitting: Surgery

## 2017-06-17 ENCOUNTER — Ambulatory Visit (HOSPITAL_COMMUNITY)
Admission: RE | Admit: 2017-06-17 | Discharge: 2017-06-17 | Disposition: A | Discharge status: Home / Self Care | Payer: PPO | Source: Ambulatory Visit | Attending: Cardiovascular Disease | Admitting: Cardiovascular Disease

## 2017-06-17 ENCOUNTER — Encounter: Payer: PPO | Admitting: Surgery

## 2017-06-17 ENCOUNTER — Encounter: Payer: Self-pay | Admitting: Surgery

## 2017-06-17 ENCOUNTER — Ambulatory Visit: Payer: PPO | Admitting: Interventional Cardiology

## 2017-06-17 ENCOUNTER — Encounter (HOSPITAL_COMMUNITY): Payer: Self-pay

## 2017-06-17 ENCOUNTER — Encounter (HOSPITAL_COMMUNITY)
Admission: RE | Admit: 2017-06-17 | Discharge: 2017-06-17 | Disposition: A | Discharge status: Home / Self Care | Payer: PPO | Source: Ambulatory Visit | Attending: Cardiovascular Disease | Admitting: Cardiovascular Disease

## 2017-06-17 ENCOUNTER — Other Ambulatory Visit: Payer: Self-pay | Admitting: *Deleted

## 2017-06-17 VITALS — BP 95/55 | HR 74 | Resp 16 | Ht 66.5 in | Wt 195.0 lb

## 2017-06-17 DIAGNOSIS — K219 Gastro-esophageal reflux disease without esophagitis: Secondary | ICD-10-CM | POA: Insufficient documentation

## 2017-06-17 DIAGNOSIS — C801 Malignant (primary) neoplasm, unspecified: Secondary | ICD-10-CM | POA: Diagnosis not present

## 2017-06-17 DIAGNOSIS — H409 Unspecified glaucoma: Secondary | ICD-10-CM | POA: Diagnosis not present

## 2017-06-17 DIAGNOSIS — Z86718 Personal history of other venous thrombosis and embolism: Secondary | ICD-10-CM | POA: Insufficient documentation

## 2017-06-17 DIAGNOSIS — Z01812 Encounter for preprocedural laboratory examination: Secondary | ICD-10-CM | POA: Insufficient documentation

## 2017-06-17 DIAGNOSIS — I350 Nonrheumatic aortic (valve) stenosis: Secondary | ICD-10-CM

## 2017-06-17 DIAGNOSIS — E785 Hyperlipidemia, unspecified: Secondary | ICD-10-CM | POA: Insufficient documentation

## 2017-06-17 DIAGNOSIS — E119 Type 2 diabetes mellitus without complications: Secondary | ICD-10-CM | POA: Diagnosis not present

## 2017-06-17 DIAGNOSIS — I25810 Atherosclerosis of coronary artery bypass graft(s) without angina pectoris: Secondary | ICD-10-CM | POA: Diagnosis not present

## 2017-06-17 DIAGNOSIS — Z01810 Encounter for preprocedural cardiovascular examination: Secondary | ICD-10-CM | POA: Insufficient documentation

## 2017-06-17 DIAGNOSIS — Z951 Presence of aortocoronary bypass graft: Secondary | ICD-10-CM | POA: Diagnosis not present

## 2017-06-17 DIAGNOSIS — I517 Cardiomegaly: Secondary | ICD-10-CM | POA: Insufficient documentation

## 2017-06-17 DIAGNOSIS — Z01818 Encounter for other preprocedural examination: Secondary | ICD-10-CM | POA: Insufficient documentation

## 2017-06-17 LAB — BLOOD GAS, ARTERIAL
Acid-Base Excess: 1.2 mmol/L (ref 0.0–2.0)
Bicarbonate: 25.1 mmol/L (ref 20.0–28.0)
DRAWN BY: 449841
FIO2: 21
O2 Saturation: 94.9 %
PCO2 ART: 38.7 mmHg (ref 32.0–48.0)
PH ART: 7.428 (ref 7.350–7.450)
Patient temperature: 98.6
pO2, Arterial: 77.4 mmHg — ABNORMAL LOW (ref 83.0–108.0)

## 2017-06-17 LAB — COMPREHENSIVE METABOLIC PANEL
ALBUMIN: 3.7 g/dL (ref 3.5–5.0)
ALK PHOS: 42 U/L (ref 38–126)
AST: 13 U/L — AB (ref 15–41)
Anion gap: 10 (ref 5–15)
BILIRUBIN TOTAL: 0.4 mg/dL (ref 0.3–1.2)
BUN: 42 mg/dL — AB (ref 6–20)
CALCIUM: 9.7 mg/dL (ref 8.9–10.3)
CO2: 24 mmol/L (ref 22–32)
CREATININE: 2.43 mg/dL — AB (ref 0.61–1.24)
Chloride: 107 mmol/L (ref 101–111)
GFR calc Af Amer: 27 mL/min — ABNORMAL LOW (ref >60)
GFR, EST NON AFRICAN AMERICAN: 24 mL/min — AB (ref >60)
Glucose, Bld: 155 mg/dL — ABNORMAL HIGH (ref 65–99)
Potassium: 4.2 mmol/L (ref 3.5–5.1)
Sodium: 141 mmol/L (ref 135–145)
TOTAL PROTEIN: 6.6 g/dL (ref 6.5–8.1)

## 2017-06-17 LAB — SURGICAL PCR SCREEN
MRSA, PCR: NEGATIVE
Staphylococcus aureus: NEGATIVE

## 2017-06-17 LAB — CBC
HEMATOCRIT: 37.6 % — AB (ref 39.0–52.0)
HEMOGLOBIN: 11.8 g/dL — AB (ref 13.0–17.0)
MCH: 32.7 pg (ref 26.0–34.0)
MCHC: 31.4 g/dL (ref 30.0–36.0)
MCV: 104.2 fL — AB (ref 78.0–100.0)
Platelets: 253 10*3/uL (ref 150–400)
RBC: 3.61 MIL/uL — ABNORMAL LOW (ref 4.22–5.81)
RDW: 14.4 % (ref 11.5–15.5)
WBC: 7.8 10*3/uL (ref 4.0–10.5)

## 2017-06-17 LAB — URINALYSIS, ROUTINE W REFLEX MICROSCOPIC
Bilirubin Urine: NEGATIVE
GLUCOSE, UA: NEGATIVE mg/dL
Hgb urine dipstick: NEGATIVE
KETONES UR: NEGATIVE mg/dL
Leukocytes, UA: NEGATIVE
NITRITE: NEGATIVE
PROTEIN: NEGATIVE mg/dL
Specific Gravity, Urine: 1.013 (ref 1.005–1.030)
pH: 5 (ref 5.0–8.0)

## 2017-06-17 LAB — PROTIME-INR
INR: 0.99
Prothrombin Time: 13.1 seconds (ref 11.4–15.2)

## 2017-06-17 LAB — APTT: aPTT: 36 seconds (ref 24–36)

## 2017-06-17 LAB — GLUCOSE, CAPILLARY: GLUCOSE-CAPILLARY: 167 mg/dL — AB (ref 65–99)

## 2017-06-17 NOTE — Progress Notes (Signed)
Call to TCT regarding order for cardiac order for meds the morning of surger, TCT will f/u & cal pt. , pt. informed of same.

## 2017-06-18 ENCOUNTER — Encounter: Payer: PPO | Admitting: Surgery

## 2017-06-18 LAB — HEMOGLOBIN A1C
HEMOGLOBIN A1C: 6.2 % — AB (ref 4.8–5.6)
MEAN PLASMA GLUCOSE: 131 mg/dL

## 2017-06-18 NOTE — Progress Notes (Signed)
Anesthesia Chart Review: Patient is a 83 year old male scheduled for TAVR, transfemoral approach on 06/22/17 by Dr. Burt Knack and Dr. Cyndia Bent.  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  PCP is Dr. Lajean Manes. Cardiologist is Dr. Daneen Schick. HF Cardiologist is Dr. Loralie Champagne.  Nephrologist is Dr. [name].  Urologist is Dr. Dutch Gray.  EKG 06/17/17: NSR, LVH with QRS widening and repolarization abnormality.   Cardiac CT 06/11/17: IMPRESSION: 1. Trileaflet, moderately thickened and calcified aortic valve with severely restricted leaflet opening. No calcifications extending into the LVOT. LVOT is large and makes annular sizing challenging. Annular measurements suitable for a delivery of a 26 mm Edwards-SAPIEN 3 valve. 2.  Sufficient annulus to coronary distance. 3. Optimum Fluoroscopic Angle for Delivery:  LAO 0 CAU 0 4.  No thrombus in the left atrial appendage.  Stress echo 04/26/17: Impressions: - LV function severely depressed at baseline which improved with   dobutamine; mean gradient 25 mmHg at baseline and increased to 41   mmHg with dobutamine; AVA 0.8 cm 2 at baseline and with peak   infusion; dimensionless index 0.2 at baseline and peak infusion;   findings are consistent with severe AS.  Echo 03/29/17: Study Conclusions - Left ventricle: The cavity size was mildly dilated. Systolic   function was severely reduced. The estimated ejection fraction   was in the range of 25% to 30%. Severe diffuse hypokinesis with   distinct regional wall motion abnormalities. Severe hypokinesis   of  the basal-mid anteroseptal and anterior myocardium; consistent   with ischemia in the distribution of the native left anterior   descending coronary artery, with a patent distal bypass graft.   Hypokinesis of the inferolateral myocardium. Doppler parameters   are consistent with abnormal left ventricular relaxation (grade 1   diastolic dysfunction). - Aortic valve: Valve mobility was severely restricted. There was   at least moderate stenosis. The ejection jet is mid peaking.   There was trivial regurgitation. - Mitral valve: There was mild regurgitation. - Left atrium: The atrium was moderately dilated. - Right ventricle: Systolic function was mildly reduced. Impressions: - Possible low gradient severe aortic stenosis. If the patient is a   candidate for TAVR/SAVR, conisder dobutamine echo for further   assessment.  Cardiac cath 11/03/16:  Severe native vessel coronary disease with total occlusion of the left main and native right coronary.  Bypass graft failure with occlusion of the saphenous vein graft to the ramus intermedius.  Widely patent saphenous vein graft to the PDA and PL branch.  Widely patent LIMA to the LAD  Normal pulmonary artery pressures including capillary wedge. LVEF known to be 25% by echocardiography. Contrast ventriculography was not performed due to inability to cross aortic valve from the left arm approach and chronic kidney disease.  Carotid U/S 06/03/17: Summary: - The vertebral arteries appear patent with antegrade flow. - Findings consistent with a 1- 56 percent stenosis involving the   right internal carotid artery and the left internal carotid   artery.  CTA chest/abd/pelvis 06/03/17: IMPRESSION: 1. Vascular findings and measurements pertinent to potential TAVR procedure, as detailed above. The patient has suitable pelvic arterial access on the left side. 2. Severe thickening calcification of the aortic  valve, compatible with the reported  clinical history of severe aortic stenosis. 3. Colonic diverticulosis without evidence of acute diverticulitis at this time. 4. 6 mm subpleural nodule in the posterior aspect of the right upper lobe. Non-contrast chest CT at 6-12 months is recommended. If the nodule is stable at time of repeat CT, then future CT at 18-24 months (from today's scan) is considered optional for low-risk patients, but is recommended for high-risk patients. This recommendation follows the consensus statement: Guidelines for Management of Incidental Pulmonary Nodules Detected on CT Images: From the Fleischner Society 2017; Radiology 2017; 284:228-243. 5. Additional incidental findings, as above.  CXR 06/17/17: IMPRESSION: No active cardiopulmonary disease.  PFTs 01/29/17: FVC 2.74 (81%), FEV1 2.16 (92%), DLCO unc 20.40 (75%).   Preoperative labs noted. BUN 42, Cr 2.43 (up from BUN 24, Cr 1.70 on 06/09/17; Cr trends since 11/2016 1.45-2.39--most often in the ~ 1.7-2.0 range). PT/PTT WNL. A1c 6.2. H/H 11.8/37.6. PLT 253. Per RN Ryan at [facility], due to increase in BUN/Cr, patient's surgery is being moved from 7:30 AM start to 8:30 AM start so patient can arrive at 5:30 AM for IV hydration. Will also get STAT repeat BMET.  George Hugh [facility] Short Stay Center/Anesthesiology Phone [phone] 06/18/2017 11:16 AM

## 2017-06-20 ENCOUNTER — Encounter: Payer: Self-pay | Admitting: Surgery

## 2017-06-20 NOTE — Progress Notes (Signed)
Patient ID: [name], male   DOB: 1934-03-30, 83 y.o.   MRN: [id number]  [place] SURGERY CONSULTATION REPORT  Referring Provider is Larey Dresser, MD PCP is Lajean Manes, MD  Chief Complaint  Patient presents with  . Aortic Stenosis    2ND TAVR EVAL .Marland Kitchenconsulted with Dr. Servando Snare on 06/15/17.Marland Kitchen    HPI:  The patient is an 83 year old gentleman with DM, hypertension, stage 4 chronic kidney disease, spinal stenosis s/p surgery complicated by septic shock, CAD s/p CABG in 2007 by Dr. Servando Snare, and aortic stenosis. He notes that for the past 1-2 years he has developed progressive shortness of breath and mild chest discomfort with exertion. He had an echo on 03/29/2017 that showed a mean aortic valve gradient of 26 mm Hg and a peak of 42 mm Hg. The valve was trileaflet with moderately calcified and thickened leaflets with severe restriction of leaflet mobility. His LVEF was 25-30%. His mean gradient was 19 mm Hg on the prior echo in 09/2016 and his EF was about the same. He had a cath in 10/2016 showing severe native vessel coronary artery disease with total occlusion of the LM and native RCA. The vein graft to the Ramus was occluded. There was a widely patent vein graft to PDA and PL and the LIMA to the LAD was patent. He had a dobutamine  Stress echo on 04/26/2017 that showed an increase in the mean gradient from 25 at baseline to 41 mm Hg at peak dobutamine.  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 He has a hx of prostate cancer diagnosed about 4 yrs ago and treated with radioactive seeds. He also was diagnosed with renal cell cancer two years ago and had a partial right nephrectomy.  Past Medical History:  Diagnosis Date  . Arthritis    RA & OA- hands, back, neck- achiness   . Azotemia   . Cancer [facility]) 2010   prostate, renal   . Chondromalacia   . Coronary artery disease   .  Diabetes mellitus without complication ([place])   . Dyspnea   . GERD (gastroesophageal reflux disease)   . Glaucoma    "high normal"  . Gout   . H/O blood clots    R Lower leg, right upper leg  . Heart murmur   . Heel spur   . Hyperlipidemia   . Hypertension   . Joint pain   . Lesion of left native kidney   . Low back pain    "severe"  . Mild aortic stenosis   . Myocardial infarction [facility])    "told me I had a small heart attack"  . Obesity   . Osteopenia   . Osteoporosis   . Peripheral vascular disease ([place]) 2012   R leg- treated with oral blood thinner  . Polymyalgia ([place])    h/o  . Renal cyst, left   . Shingles    LEFT ABDOMEN  . Spinal stenosis   . Thrombophlebitis   . Tinnitus   . Tubular adenoma     Past Surgical History:  Procedure Laterality Date  . APPENDECTOMY  60 years ago  . BACK SURGERY    . CARDIAC CATHETERIZATION    . CARDIAC CATHETERIZATION N/A 11/03/2016   Procedure: Right/Left Heart Cath and Coronary/Graft Angiography;  Surgeon: Belva Crome, MD;  Location: [facility];  Service: Cardiovascular;  Laterality: N/A;  . CORONARY ARTERY BYPASS GRAFT  2007  . EYE SURGERY Right    /w IOL, post cataracts   . PROSTATE SURGERY     seed implant  . ROBOTIC ASSITED PARTIAL NEPHRECTOMY Left 08/31/2013   Procedure: ROBOTIC ASSITED PARTIAL NEPHRECTOMY;  Surgeon: Dutch Gray, MD;  Location: [facility];  Service: Urology;  Laterality: Left;  Marland Kitchen VEIN LIGATION AND STRIPPING Right 1972   right lower leg    Family History  Problem Relation Age of Onset  . Heart Problems Mother   . Heart Problems Father     Social History   Social History  . Marital status: Married    Spouse name: N/A  . Number of children: N/A  . Years of education: N/A   Occupational History  . Not on file.   Social History Main Topics  . Smoking status: Former Smoker    Packs/day: 3.00    Years: 30.00    Types: Cigarettes    Quit date: 12/21/1980  . Smokeless tobacco: Never Used    . Alcohol use No  . Drug use: No  . Sexual activity: Not on file   Other Topics Concern  . Not on file   Social History Narrative  . No narrative on file    Current Outpatient Prescriptions  Medication Sig Dispense Refill  . alendronate (FOSAMAX) 70 MG tablet Take 70 mg by mouth once a week. Take with a full glass of water on an empty stomach.    Marland Kitchen allopurinol (ZYLOPRIM) 300 MG tablet Take 300 mg by mouth daily.  2  . aspirin 81 MG chewable tablet Chew 81 mg by mouth every evening.     . Calcium Carbonate-Vitamin D 600-200 MG-UNIT TABS Take 1 tablet by mouth daily.    . carvedilol (COREG) 3.125 MG tablet TAKE 1 TABLET BY MOUTH TWICE DAILY 60 tablet 0  . diclofenac sodium (VOLTAREN) 1 % GEL Apply 2 g topically daily as needed (pain).    Marland Kitchen glipiZIDE (GLUCOTROL) 10 MG tablet Take 5-10 mg by mouth 2 (two) times daily before a meal. 10 mg in the morning and 5 mg in the evening  2  . HYDROcodone-acetaminophen (NORCO) 10-325 MG tablet Take 1-2 tablets by mouth every 6 (six) hours as needed for moderate pain. (Patient taking differently: Take 1-2 tablets by mouth 3 (three) times daily as needed for moderate pain. Depends on pain level if takes 1-2 tablets) 10 tablet 0  . Liniments (SALONPAS PAIN RELIEF PATCH EX) Apply 1 patch topically daily as needed (pain).    . Magnesium Oxide 200 MG TABS Take 1 tablet (200 mg total) by mouth daily. 30 tablet 6  . Multiple Vitamin (MULTIVITAMIN) tablet Take 1 tablet by mouth daily.    . rosuvastatin (CRESTOR) 10 MG tablet Take 10 mg by mouth daily.    . tamsulosin (FLOMAX) 0.4 MG CAPS capsule Take 0.4 mg by mouth daily after supper.    . torsemide (DEMADEX) 20 MG tablet Take 1 tablet (20 mg total) by mouth daily. (Patient taking differently: Take 20 mg by mouth every evening. ) 45 tablet 3   No current facility-administered medications for this visit.     Allergies  Allergen Reactions  . Adacel [Diphth-Acell Pertussis-Tetanus]     Pt not sure of  reaction  . Fish Allergy Swelling    Dark fish- salmon, tuna   . Flexeril [Cyclobenzaprine]     Causes confusion  . Lasix [Furosemide] Swelling  . Lipitor [Atorvastatin]  Other (See Comments)    Muscle pain  . Pantoprazole Diarrhea  . Penicillins Swelling    UNKNOWN Swelling of the feet  Has patient had a PCN reaction causing immediate rash, facial/tongue/throat swelling, SOB or lightheadedness with hypotension: no Has patient had a PCN reaction causing severe rash involving mucus membranes or skin necrosis: {no Has patient had a PCN reaction that required hospitalization {no Has patient had a PCN reaction occurring within the last 10 years: {no If all of the above answers are "NO", then may proceed with Cephalosporin use.  Sarina Ill [Sulfamethoxazole-Trimethoprim]     Pt unsure of reaction   . Simvastatin     MUSCLE PAIN      Review of Systems:   General:  good appetite, reduced energy, no weight gain, some weight loss, no fever  Cardiac:  has chest pain with exertion, no chest pain at rest, has SOB with mild exertion, no resting SOB, no PND, some orthopnea, no palpitations, no arrhythmia, no atrial fibrillation, some LE edema, no dizzy spells, no syncope  Respiratory:  exertional shortness of breath, no home oxygen, no productive cough, no dry cough, no bronchitis, no wheezing, no hemoptysis, no asthma, no pain with inspiration or cough, no sleep apnea, no CPAP at night  GI:   no difficulty swallowing, no reflux, no frequent heartburn, no hiatal hernia, no abdominal pain, no constipation, no diarrhea, no hematochezia, no hematemesis, no melena  GU:   no dysuria,  no frequency, no urinary tract infection, no hematuria, no enlarged prostate, no kidney stones, hx of partial nephrectomy for renal cell cancer  Vascular:  no pain suggestive of claudication, no pain in feet, no leg cramps, no varicose veins, history of  DVT, no non-healing foot ulcer  Neuro:   no stroke, no TIA's, no  seizures, no headaches, no temporary blindness one eye,  no slurred speech, no peripheral neuropathy, has chronic pain, has instability of gait, no memory/cognitive dysfunction  Musculoskeletal: has arthritis, no joint swelling, no myalgias, has difficulty walking, reduced mobility   Skin:   no rash, no itching, no skin infections, no pressure sores or ulcerations  Psych:   no anxiety, no depression, no nervousness, no unusual recent stress  Eyes:   no blurry vision, no floaters, no recent vision changes  wears glasses   ENT:   no hearing loss, no loose or painful teeth, no dentures, last saw dentist this year  Hematologic:  no easy bruising, no abnormal bleeding, no clotting disorder, no frequent epistaxis  Endocrine:  has diabetes, does check CBG's at home     Physical Exam:   BP (!) 95/55 (BP Location: Right Arm, Patient Position: Sitting, Cuff Size: Large)   Pulse 74 Comment: ON RA  Resp 16   Ht 5' 6.5" (1.689 m)   Wt 195 lb (88.5 kg)   SpO2 93% Comment: ON RA  BMI 31.00 kg/m   General:  Elderly but  well-appearing  HEENT:  Unremarkable, NCAT, PERLA, EOMI, oropharynx clear, teeth in fair condition  Neck:   no JVD, no bruits, no adenopathy or thyromegaly  Chest:   clear to auscultation, symmetrical breath sounds, no wheezes, no rhonchi   CV:   RRR, grade III/VI crescendo/decrescendo murmur heard best at RSB,  no diastolic murmur  Abdomen:  soft, non-tender, no masses or organomegaly  Extremities:  warm, well-perfused, pulses palpable in feet, mild LE edema  Rectal/GU  Deferred  Neuro:   Grossly non-focal and symmetrical throughout  Skin:  Clean and dry, no rashes, no breakdown   Diagnostic Tests:   [facility]                        [address], [place] [address]                            [phone]  ------------------------------------------------------------------- Transthoracic Echocardiography  Patient:    Gunther, Zawadzki MR #:       [id number] Study Date: 03/29/2017 Gender:     M Age:        13 Height:     167.6 cm Weight:     89.4 kg BSA:        2.07 m^2 Pt. Status: Room:   SONOGRAPHER  Cindy Hazy, RDCS  PERFORMING   [facility], Outpatient  ATTENDING    Loney Laurence  REFERRING    Angelena Form R  cc:  ------------------------------------------------------------------- LV EF: 25% -   30%  ------------------------------------------------------------------- Indications:      O13.08 Chronic Systolic Heart Failure.  ECHO WITH DEFINITY.  ------------------------------------------------------------------- History:   PMH:  Acquired from the patient and from the patient&'s chart.  PMH:  CAD. Myocardial Infarction.  Risk factors: Hypertension. Diabetes mellitus. Dyslipidemia.  ------------------------------------------------------------------- Study Conclusions  - Left ventricle: The cavity size was mildly dilated. Systolic   function was severely reduced. The estimated ejection fraction   was in the range of 25% to 30%. Severe diffuse hypokinesis with   distinct regional wall motion abnormalities. Severe hypokinesis   of the basal-mid anteroseptal and anterior myocardium; consistent   with ischemia in the distribution of the native left anterior   descending coronary artery, with a patent distal bypass graft.   Hypokinesis of the inferolateral myocardium. Doppler parameters   are consistent with abnormal left ventricular relaxation (grade 1   diastolic dysfunction). - Aortic valve: Valve mobility was severely restricted. There was   at least moderate stenosis. The ejection jet is mid peaking.   There was trivial regurgitation. - Mitral valve: There was mild regurgitation. - Left atrium: The atrium was moderately dilated. - Right ventricle: Systolic function was mildly reduced.  Impressions:  - Possible low gradient severe aortic  stenosis. If the patient is a   candidate for TAVR/SAVR, conisder dobutamine echo for further   assessment.  ------------------------------------------------------------------- Study data:   Study status:  Routine.  Procedure:  The patient reported no pain pre or post test. Transthoracic echocardiography for left ventricular function evaluation, for right ventricular function evaluation, and for assessment of valvular function. Image quality was adequate. Intravenous contrast (Definity) was administered to enhance regional wall motion assessment and opacify the LV.  Study completion:  There were no complications. Transthoracic echocardiography.  M-mode, complete 2D, spectral Doppler, and color Doppler.  Birthdate:  Patient birthdate: 1934-07-06.  Age:  Patient is 83 yr old.  Sex:  Gender: male. BMI: 31.8 kg/m^2.  Blood pressure:     130/64  Patient status: Outpatient.  Study date:  Study date: 03/29/2017. Study time: 10:50 AM.  Location:  [facility]  -------------------------------------------------------------------  ------------------------------------------------------------------- Left ventricle:  The cavity size was mildly dilated. Systolic function was severely reduced. The estimated ejection fraction was in the  range of 25% to 30%.  Severe diffuse hypokinesis with distinct regional wall motion abnormalities.  Regional wall motion abnormalities:  Severe hypokinesis of the basal-mid anteroseptal and anterior myocardium; consistent with ischemia in the distribution of the native left anterior descending coronary artery, with a patent distal bypass graft.  Hypokinesis of the inferolateral myocardium. The pulmonary vein flow pattern was normal. Doppler parameters are consistent with abnormal left ventricular relaxation (grade 1 diastolic dysfunction). There was no evidence of elevated ventricular filling pressure by  Doppler parameters.  ------------------------------------------------------------------- Aortic valve:   Trileaflet; moderately thickened, moderately calcified leaflets. Valve mobility was severely restricted. Doppler:  There was at least moderate stenosis. The ejection jet is mid peaking. There was trivial regurgitation.    VTI ratio of LVOT to aortic valve: 0.18. Valve area (VTI): 0.79 cm^2. Indexed valve area (VTI): 0.38 cm^2/m^2. Peak velocity ratio of LVOT to aortic valve: 0.16. Valve area (Vmax): 0.7 cm^2. Indexed valve area (Vmax): 0.34 cm^2/m^2. Mean velocity ratio of LVOT to aortic valve: 0.18. Valve area (Vmean): 0.78 cm^2. Indexed valve area (Vmean): 0.38 cm^2/m^2.    Mean gradient (S): 26 mm Hg. Peak gradient (S): 42 mm Hg.  ------------------------------------------------------------------- Aorta:  Aortic root: The aortic root was normal in size. Ascending aorta: The ascending aorta was normal in size.  ------------------------------------------------------------------- Mitral valve:   Structurally normal valve.   Leaflet separation was normal.  Doppler:  Transvalvular velocity was within the normal range. There was no evidence for stenosis. There was mild regurgitation.    Peak gradient (D): 1 mm Hg.  ------------------------------------------------------------------- Left atrium:  The atrium was moderately dilated.  ------------------------------------------------------------------- Right ventricle:  The cavity size was normal. Systolic function was mildly reduced.  ------------------------------------------------------------------- Pulmonic valve:    Structurally normal valve.   Cusp separation was normal.  Doppler:  Transvalvular velocity was within the normal range. There was no regurgitation.  ------------------------------------------------------------------- Tricuspid valve:   Structurally normal valve.   Leaflet separation was normal.  Doppler:   Transvalvular velocity was within the normal range. There was no regurgitation.  ------------------------------------------------------------------- Pulmonary artery:    Systolic pressure could not be accurately estimated.  ------------------------------------------------------------------- Right atrium:  The atrium was normal in size.  ------------------------------------------------------------------- Pericardium:  There was no pericardial effusion.  ------------------------------------------------------------------- Systemic veins: Inferior vena cava: The vessel was normal in size. The respirophasic diameter changes were in the normal range (= 50%), consistent with normal central venous pressure.  ------------------------------------------------------------------- Measurements   Left ventricle                            Value          Reference  LV ID, ED, PLAX chordal           (H)     58.43 mm       43 - 52  LV ID, ES, PLAX chordal           (H)     51.14 mm       23 - 38  LV fx shortening, PLAX chordal    (L)     12    %        >=29  LV PW thickness, ED                       11.6  mm       ---------  IVS/LV PW ratio, ED  1.06           <=1.3  Stroke volume, 2D                         55    ml       ---------  Stroke volume/bsa, 2D                     27    ml/m^2   ---------  LV e&', lateral                            4.23  cm/s     ---------  LV E/e&', lateral                          11.31          ---------  LV e&', medial                             5.05  cm/s     ---------  LV E/e&', medial                           9.47           ---------  LV e&', average                            4.64  cm/s     ---------  LV E/e&', average                          10.31          ---------    Ventricular septum                        Value          Reference  IVS thickness, ED                         12.3  mm       ---------    LVOT                                       Value          Reference  LVOT ID, S                                23.6  mm       ---------  LVOT area                                 4.37  cm^2     ---------  LVOT ID                                   22    mm       ---------  LVOT peak velocity, S  53.3  cm/s     ---------  LVOT mean velocity, S                     44.2  cm/s     ---------  LVOT VTI, S                               13.01 cm       ---------  Stroke volume (SV), LVOT DP               56.9  ml       ---------  Stroke index (SV/bsa), LVOT DP            27.5  ml/m^2   ---------    Aortic valve                              Value          Reference  Aortic valve peak velocity, S             324   cm/s     ---------  Aortic valve mean velocity, S             247   cm/s     ---------  Aortic valve VTI, S                       74    cm       ---------  Aortic mean gradient, S                   26    mm Hg    ---------  Aortic peak gradient, S                   42    mm Hg    ---------  VTI ratio, LVOT/AV                        0.18           ---------  Aortic valve area, VTI                    0.79  cm^2     ---------  Aortic valve area/bsa, VTI                0.38  cm^2/m^2 ---------  Velocity ratio, peak, LVOT/AV             0.16           ---------  Aortic valve area, peak velocity          0.7   cm^2     ---------  Aortic valve area/bsa, peak               0.34  cm^2/m^2 ---------  velocity  Velocity ratio, mean, LVOT/AV             0.18           ---------  Aortic valve area, mean velocity          0.78  cm^2     ---------  Aortic valve area/bsa, mean               0.38  cm^2/m^2 ---------  velocity  Aortic regurg pressure half-time  473   ms       ---------    Aorta                                     Value          Reference  Aortic root ID, ED                        37    mm       ---------  Ascending aorta ID, A-P, S                35    mm       ---------    Left atrium                                Value          Reference  LA ID, A-P, ES                            47    mm       ---------  LA ID/bsa, A-P                    (H)     2.27  cm/m^2   <=2.2  LA volume, S                              61    ml       ---------  LA volume/bsa, S                          29.5  ml/m^2   ---------  LA volume, ES, 1-p A4C                    58    ml       ---------  LA volume/bsa, ES, 1-p A4C                28    ml/m^2   ---------  LA volume, ES, 1-p A2C                    64    ml       ---------  LA volume/bsa, ES, 1-p A2C                30.9  ml/m^2   ---------    Mitral valve                              Value          Reference  Mitral E-wave peak velocity               47.84 cm/s     ---------  Mitral A-wave peak velocity               87    cm/s     ---------  Mitral deceleration time          (H)     310   ms       150 -  230  Mitral peak gradient, D                   1     mm Hg    ---------  Mitral E/A ratio, peak                    0.55           ---------    Right ventricle                           Value          Reference  RV s&', lateral, S                         10.7  cm/s     ---------  Legend: (L)  and  (H)  mark values outside specified reference range.  ------------------------------------------------------------------- Prepared and Electronically Authenticated by  Sanda Klein, MD 2018-04-09T14:41:52  Physicians   Panel Physicians Referring Physician Case Authorizing Physician  Belva Crome, MD (Primary)    Procedures   Right/Left Heart Cath and Coronary/Graft Angiography  Conclusion    Severe native vessel coronary disease with total occlusion of the left main and native right coronary.  Bypass graft failure with occlusion of the saphenous vein graft to the ramus intermedius.  Widely patent saphenous vein graft to the PDA and PL branch.  Widely patent LIMA to the LAD  Normal pulmonary artery pressures including  capillary wedge.   Indications   Acute systolic heart failure (HCC) [I50.21 (ICD-10-CM)]  Coronary artery disease involving autologous vein coronary bypass graft without angina pectoris [I25.810 (ICD-10-CM)]  Procedural Details/Technique   Technical Details The left radial area was sterilely prepped and draped. Intravenous sedation with Versed and fentanyl was administered. 1% Xylocaine was infiltrated to achieve local analgesia. A double wall stick with an angiocath was utilized to obtain intra-arterial access. The modified Seldinger technique was used to place a 47F " Slender" sheath in the left radial artery. Weight based heparin was administered. Coronary angiography was done using 5 F catheters. Right coronary angiography was performed with a JR4. Left ventricular hemodymic recordings were not possible. We used multiple catheters in an attempt to cross the aortic valve but was unsuccessful. There was inability to free torque control due to the angulation from the subclavian into the aorta. Left coronary angiography was performed with a JL 3.5 cm. Left ventriculography was not attempted due to CKD.  Right heart catheterization was performed by exchanging an antecubital angiocath IV for a 5 French brachial sheath. 1% Xylocaine local infiltration at the IV site was given prior to sheath exchange. A double glove technique was used. The modified Seldinger technique was employed. After sheath insertion, right heart cath was performed using a 5 French balloon tipped catheter. Pressures were recorded in each chamber. A main pulmonary artery O2 saturation was obtained. I wedge pressure was also measured.   Hemostasis was achieved using a pneumatic band.  During this procedure the patient is administered a total of Versed 2 mg and Fentanyl 50 mg to achieve and maintain moderate conscious sedation. The patient's heart rate, blood pressure, and oxygen saturation are monitored continuously during the  procedure. The period of conscious sedation is 46 minutes, of which I was present face-to-face 100% of this time.   Estimated blood loss <50 mL.  During this procedure the patient was administered the  following to achieve and maintain moderate conscious sedation: Versed 2 mg, Fentanyl 50 mcg, while the patient's heart rate, blood pressure, and oxygen saturation were continuously monitored. The period of conscious sedation was 46 minutes, of which I was present face-to-face 100% of this time.    Coronary Findings   Dominance: Right  Left Main  LM lesion, 100% stenosed.  Left Anterior Descending  First Diagonal Branch  Ost 1st Diag lesion, 100% stenosed.  Second Diagonal Branch  Vessel is small in size.  Third Diagonal Branch  Vessel is small in size.  Right Coronary Artery  Prox RCA lesion, 100% stenosed.  Mid RCA to Dist RCA lesion, 100% stenosed.  Right Posterior Descending Artery  Vessel is small in size.  First Right Posterolateral  Vessel is small in size.  Graft Angiography  Free LIMA Graft to Dist LAD  LIMA.  Graft to Post Atrio  and is very large.  saphenous Graft to Ramus  SVG.  Prox Graft lesion, 100% stenosed.  Right Heart   Right Heart Pressures LV EDP is normal. Right heart pressures were normal.    Left Heart   Left Ventricle LVEF known to be 25% by echocardiography. Contrast ventriculography was not performed due to inability to cross aortic valve from the left arm approach and chronic kidney disease.    Coronary Diagrams   Diagnostic Diagram       Implants     No implant documentation for this case.  PACS Images   Show images for Cardiac catheterization   Link to Procedure Log   Procedure Log    Hemo Data    Most Recent Value  Fick Cardiac Output 5.33 L/min  Fick Cardiac Output Index 2.72 (L/min)/BSA  RA A Wave 7 mmHg  RA V Wave 7 mmHg  RA Mean 6 mmHg  RV Systolic Pressure 25 mmHg  RV Diastolic Pressure 4 mmHg  RV EDP 9 mmHg  PA  Systolic Pressure 21 mmHg  PA Diastolic Pressure 4 mmHg  PA Mean 12 mmHg  PW A Wave 10 mmHg  PW V Wave 11 mmHg  PW Mean 6 mmHg  AO Systolic Pressure 696 mmHg  AO Diastolic Pressure 56 mmHg  AO Mean 80 mmHg  QP/QS 1  TPVR Index 5.51 HRUI  TSVR Index 29.41 HRUI  PVR SVR Ratio 0.09  TPVR/TSVR Ratio 0.19      *[facility]*                        [address], [place] [address]                            [phone]  ------------------------------------------------------------------- Stress Echocardiography  Patient:    Kaydon, Husby MR #:       [id number] Study Date: 04/26/2017 Gender:     M Age:        15 Height:     167.6 cm Weight:     89.4 kg BSA:        2.07 m^2 Pt. Status: Room:   ATTENDING    Belva Crome, MD  Livia Snellen     Belva Crome, MD  [place]atah, MD  SONOGRAPHER  Cindy Hazy, RDCS  PERFORMING   [facility],  Outpatient  cc:  ------------------------------------------------------------------- LV EF: 20% -   25%  ------------------------------------------------------------------- Indications:      I35.9 Aortic Valve Disorder.  ASSESS FOR LOW GRADIENT AORTIC STENOSIS.  ------------------------------------------------------------------- History:   PMH:  CAD. Myocardial Infarction.  Risk factors: Hypertension. Diabetes mellitus. Dyslipidemia.  ------------------------------------------------------------------- Study Conclusions  - Left ventricle: The cavity size was mildly dilated. There was   mild concentric hypertrophy. Systolic function was severely   reduced. The estimated ejection fraction was in the range of 20%   to 25%. Diffuse hypokinesis. - Aortic valve: Valve mobility was restricted. There was severe   stenosis. There was trivial regurgitation. - Left atrium: The atrium was mildly dilated. - Stress ECG conclusions: The stress ECG was normal.  Impressions:  - LV  function severely depressed at baseline which improved with   dobutamine; mean gradient 25 mmHg at baseline and increased to 41   mmHg with dobutamine; AVA 0.8 cm 2 at baseline and with peak   infusion; dimensionless index 0.2 at baseline and peak infusion;   findings are consistent with severe AS.  ------------------------------------------------------------------- Study data:   Study status:  Routine.  Consent:  The risks, benefits, and alternatives to the procedure were explained to the patient and informed consent was obtained.  Procedure:  The patient reported no pain pre or post test. Initial setup. The patient was brought to the laboratory. A baseline ECG was recorded. Surface ECG leads and automatic cuff blood pressure measurements were monitored.  Dobutamine stress test. Stress testing was performed, with dobutamine infusion from 5 to 20 mcg/kg/min by 5 mcg/kg/min increments. The infusion was terminated due to maximal dose administration. Transthoracic stress echocardiography for left ventricular function evaluation and assessment of valvular function. Images were captured at baseline, low dose, peak dose, and recovery.  Study completion:  The patient tolerated the procedure well. There were no complications.          Dobutamine. Stress echocardiography.  2D.  Birthdate:  Patient birthdate: [birth year].  Age:  Patient is 83 yr old.  Sex:  Gender: male. BMI: 31.8 kg/m^2.  Blood pressure:     112/66  Patient status: Outpatient.  Study date:  Study date: 04/26/2017. Study time: 02:24 PM.  -------------------------------------------------------------------  ------------------------------------------------------------------- Left ventricle:  The cavity size was mildly dilated. There was mild concentric hypertrophy. Systolic function was severely reduced. The estimated ejection fraction was in the range of 20% to 25%.  Diffuse hypokinesis.  ------------------------------------------------------------------- Aortic valve:   Trileaflet; severely calcified leaflets. Valve mobility was restricted.  Doppler:   There was severe stenosis. There was trivial regurgitation.    VTI ratio of LVOT to aortic valve: 0.26. Valve area (VTI): 0.99 cm^2. Indexed valve area (VTI): 0.48 cm^2/m^2. Peak velocity ratio of LVOT to aortic valve: 0.22. Valve area (Vmax): 0.85 cm^2. Indexed valve area (Vmax): 0.41 cm^2/m^2. Mean velocity ratio of LVOT to aortic valve: 0.22. Valve area (Vmean): 0.85 cm^2. Indexed valve area (Vmean): 0.41 cm^2/m^2.    Mean gradient (S): 28 mm Hg. Peak gradient (S): 51 mm Hg.   ------------------------------------------------------------------- Left atrium:  The atrium was mildly dilated.  ------------------------------------------------------------------- Right ventricle:  The cavity size was normal. Systolic function was normal.  ------------------------------------------------------------------- Right atrium:  The atrium was normal in size.  ------------------------------------------------------------------- Baseline ECG:  Normal sinus rhythm, septal MI, nonspecific ST changes.  ------------------------------------------------------------------- Stress protocol:  +-----------------------+---+------------+--------+ !Stage                  !HR !BP (mmHg)   !Symptoms! +-----------------------+---+------------+--------+ !  Baseline               !85 !112/66 (81) !None    ! +-----------------------+---+------------+--------+ !Dobutamine 5 ug/kg/min !82 !137/70 (92) !None    ! +-----------------------+---+------------+--------+ !Dobutamine 10 ug/kg/min!82 !141/69 (93) !None    ! +-----------------------+---+------------+--------+ !Dobutamine 20 ug/kg/min!89 !152/69 (97) !None    ! +-----------------------+---+------------+--------+ !Immediate post stress  !80 !------------!None     ! +-----------------------+---+------------+--------+ !Recovery; 1 min        !80 !159/70 (100)!None    ! +-----------------------+---+------------+--------+ !Recovery; 2 min        !75 !------------!None    ! +-----------------------+---+------------+--------+ !Recovery; 3 min        !90 !158/72 (101)!None    ! +-----------------------+---+------------+--------+ !Recovery; 4 min        !100!------------!None    ! +-----------------------+---+------------+--------+ !Recovery; 5 min        !96 !113/70 (84) !None    ! +-----------------------+---+------------+--------+  ------------------------------------------------------------------- Stress results:   Maximal heart rate during stress was 100 bpm (71% of maximal predicted heart rate). The maximal predicted heart rate was 140 bpm.The target heart rate was achieved. The heart rate response to stress was normal. There was a normal resting blood pressure. Normal blood pressure response to dobutamine. The rate-pressure product for the peak heart rate and blood pressure was 14220 mm Hg/min.  The patient experienced no chest pain during stress.  ------------------------------------------------------------------- Stress ECG:   The stress ECG was normal.  ------------------------------------------------------------------- Baseline:  - LV size was mildly enlarged. - LV global systolic function was severely depressed. The estimated   LV ejection fraction was 20%. - There was diffuse LV hypokinesis. LVOT VTI 60 cm/s; AV VTI 305   cm/s; Mean gradient 25 mmHg; AVA 0.8 cm2; DI 0.2.  Low dose: 10 micrograms/Kg/min-LVOT VTI 60 cm/s; AV VTI 360 cm/s; mean gradient 37 mmHg; AVA 0.63 cm2; DI 0.17 cm2. Peak stress:  - LVOT VTI 76 cm/2; AV VTI 384 cm2; mean gradient 41 mmHg: AVA 0.8   cm2; DI 0.2. - LV global systolic function was mildly to moderately depressed.   The estimated LV ejection fraction was  40-45%.  Recovery:  ------------------------------------------------------------------- Measurements   Left ventricle                            Value          Reference  LV ID, ED, PLAX chordal           (H)     55.7  mm       43 - 52  LV ID, ES, PLAX chordal           (H)     44.8  mm       23 - 38  LV fx shortening, PLAX chordal    (L)     20    %        >=29  LV PW thickness, ED                       11.9  mm       ---------  IVS/LV PW ratio, ED                       1.02           <=1.3  Stroke volume, 2D  50    ml       ---------  Stroke volume/bsa, 2D                     24    ml/m^2   ---------    Ventricular septum                        Value          Reference  IVS thickness, ED                         12.1  mm       ---------    LVOT                                      Value          Reference  LVOT ID, S                                22    mm       ---------  LVOT area                                 3.8   cm^2     ---------  LVOT peak velocity, S                     79.8  cm/s     ---------  LVOT mean velocity, S                     56.3  cm/s     ---------  LVOT VTI, S                               13.1  cm       ---------  LVOT peak gradient, S                     3     mm Hg    ---------    Aortic valve                              Value          Reference  Aortic valve peak velocity, S             358   cm/s     ---------  Aortic valve mean velocity, S             252   cm/s     ---------  Aortic valve VTI, S                       50.5  cm       ---------  Aortic mean gradient, S                   28    mm Hg    ---------  Aortic peak gradient, S  51    mm Hg    ---------  VTI ratio, LVOT/AV                        0.26           ---------  Aortic valve area, VTI                    0.99  cm^2     ---------  Aortic valve area/bsa, VTI                0.48  cm^2/m^2 ---------  Velocity ratio, peak, LVOT/AV             0.22            ---------  Aortic valve area, peak velocity          0.85  cm^2     ---------  Aortic valve area/bsa, peak               0.41  cm^2/m^2 ---------  velocity  Velocity ratio, mean, LVOT/AV             0.22           ---------  Aortic valve area, mean velocity          0.85  cm^2     ---------  Aortic valve area/bsa, mean               0.41  cm^2/m^2 ---------  velocity    Aorta                                     Value          Reference  Aortic root ID, ED                        35    mm       ---------    Left atrium                               Value          Reference  LA ID, A-P, ES                            46    mm       ---------  LA ID/bsa, A-P                    (H)     2.22  cm/m^2   <=2.2  Legend: (L)  and  (H)  mark values outside specified reference range.  ------------------------------------------------------------------- Prepared and Electronically Authenticated by  Kirk Ruths 2018-05-07T16:25:31   CT CORONARY MORPH W/CTA COR W/SCORE W/CA W/CM &/OR WO/CM (Accession [id number]) (Order [id number])  Imaging  Date: 06/11/2017 Department: [facility] CT IMAGING Released By: Michael Boston Authorizing: Sherren Mocha, MD  Exam Information   Status Exam Begun  Exam Ended   Final [99] 06/11/2017 11:06 AM 06/11/2017 12:49 PM  PACS Images   Show images for CT CORONARY MORPH W/CTA COR W/SCORE W/CA W/CM &/OR WO/CM  Addendum   ADDENDUM REPORT: 06/11/2017 16:08  CLINICAL DATA:  83 year old male with severe aortic stenosis being evaluated for TAVR.  EXAM: Cardiac  TAVR CT  TECHNIQUE: The patient was scanned on a Philips 256 scanner. A 120 kV retrospective scan was triggered in the descending thoracic aorta at 111 HU's. Gantry rotation speed was 270 msecs and collimation was .9 mm. 10 mg of iv Metoprolol and no nitro were given. The 3D data set was reconstructed in 5% intervals of the R-R cycle. Systolic and diastolic phases were  analyzed on a dedicated work station using MPR, MIP and VRT modes. The patient received 80 cc of contrast.  FINDINGS: Aortic Valve: Trileaflet, moderately thickened and calcified aortic valve with severely restricted leaflet opening. No calcifications extending into the LVOT. LVOT is large.  Aorta:  Normal size, trivial calcifications.  No dissection.  Sinotubular Junction:  30 x 28 mm  Ascending Thoracic Aorta:  37 x 36 mm  Aortic Arch:  Not visualized  Descending Thoracic Aorta:  26 x 23 mm  Sinus of Valsalva Measurements:  Non-coronary:  34 mm  Right -coronary:  29 mm  Left -coronary:  34 mm  Coronary Artery Height above Annulus:  Left Main:  14 mm  Right Coronary:  13 mm  Virtual Basal Annulus Measurements:  Maximum/Minimum Diameter:  28 x 24 mm  Perimeter:  84 mm  Area:  535 mm2  Optimum Fluoroscopic Angle for Delivery:  LAO 0 CAU 0  IMPRESSION: 1. Trileaflet, moderately thickened and calcified aortic valve with severely restricted leaflet opening. No calcifications extending into the LVOT. LVOT is large and makes annular sizing challenging. Annular measurements suitable for a delivery of a 26 mm Edwards-SAPIEN 3 valve.  2.  Sufficient annulus to coronary distance.  3. Optimum Fluoroscopic Angle for Delivery:  LAO 0 CAU 0  4.  No thrombus in the left atrial appendage.  Ena Dawley   Electronically Signed   By: Ena Dawley   On: 06/11/2017 16:08   Addended by Dorothy Spark, MD on 06/11/2017 4:10 PM    Study Result   EXAM: OVER-READ INTERPRETATION  CT CHEST  The following report is an over-read performed by radiologist Dr. Collene Leyden [facility] [place] on 06/11/2017. This over-read does not include interpretation of cardiac or coronary anatomy or pathology. The coronary CTA interpretation by the cardiologist is attached.  COMPARISON:  06/03/2017  FINDINGS: Cardiovascular: Heart is  normal size. Extensive calcifications throughout the aorta which is normal caliber. No dissection.  Mediastinum/Nodes: No adenopathy in the visualized lower mediastinum or hila. Calcified left hilar lymph nodes.  Lungs/Pleura: Visualized lungs are clear.  No effusions.  Upper Abdomen: Imaging into the upper abdomen shows no acute findings.  Musculoskeletal: No acute bony abnormality. Chest wall soft tissues unremarkable. Prior median sternotomy and CABG.  IMPRESSION: No acute or significant extracardiac abnormality.  Electronically Signed: By: Rolm Baptise M.D. On: 06/11/2017 14:41       CT Angio Abd/Pel w/ and/or w/o (Accession [id number]) (Order [id number])  Imaging  Date: 06/03/2017 Department: [facility] CT IMAGING Released By: Jillyn Hidden Authorizing: Sherren Mocha, MD  Exam Information   Status Exam Begun  Exam Ended   Final [99] 06/03/2017 1:53 PM 06/03/2017 2:17 PM  Study Result   CLINICAL DATA:  83 year old male with history of severe aortic stenosis. Preprocedural study prior to potential transcatheter aortic valve replacement (TAVR) procedure.  EXAM: CT ANGIOGRAPHY CHEST, ABDOMEN AND PELVIS  TECHNIQUE: Multidetector CT imaging through the chest, abdomen and pelvis was performed using the standard protocol during bolus administration of intravenous contrast. Multiplanar reconstructed images and MIPs were  obtained and reviewed to evaluate the vascular anatomy.  CONTRAST:  80 mL of Isovue 370.  COMPARISON:  CT the abdomen and pelvis 11/10/2016.  FINDINGS: CTA CHEST FINDINGS  Cardiovascular: Heart size is normal. There is no significant pericardial fluid, thickening or pericardial calcification. There is aortic atherosclerosis, as well as atherosclerosis of the great vessels of the mediastinum and the coronary arteries, including calcified atherosclerotic plaque in the left main, left anterior descending, left  circumflex and right coronary arteries. Status post median sternotomy for CABG including LIMA to the LAD. Severe thickening calcification of the aortic valve. Calcification of the mitral valve and mitral annulus.  Mediastinum/Lymph Nodes: No pathologically enlarged mediastinal or hilar lymph nodes. Esophagus is unremarkable in appearance. No axillary lymphadenopathy.  Lungs/Pleura: 6 mm subpleural nodule in the posterior aspect of the right upper lobe (axial image 65 of series 6). No acute consolidative airspace disease. No pleural effusions.  Musculoskeletal/Soft Tissues: Median sternotomy wires. There are no aggressive appearing lytic or blastic lesions noted in the visualized portions of the skeleton.  CTA ABDOMEN AND PELVIS FINDINGS  Hepatobiliary: No cystic or solid hepatic lesions. No intra or extrahepatic biliary ductal dilatation. Gallbladder is normal in appearance.  Pancreas: No pancreatic mass. No pancreatic ductal dilatation. No pancreatic or peripancreatic fluid or inflammatory changes.  Spleen: Small calcified granulomas in the spleen.  Adrenals/Urinary Tract: Small nonobstructive calculi are present within the collecting systems of the kidneys bilaterally, largest of which measures 3 mm in the interpolar region of the right kidney. Mild atrophy and multifocal cortical thinning in the kidneys bilaterally. Multiple subcentimeter low-attenuation lesions in the kidneys bilaterally, too small to characterize, but favored to represent cysts. In addition, there is an exophytic low-attenuation lesion in the lower pole of the right kidney, compatible with a simple cyst. No hydroureteronephrosis. Urinary bladder is normal in appearance. Bilateral adrenal glands are normal in appearance.  Stomach/Bowel: The appearance of the stomach is normal. There is no pathologic dilatation of small bowel or colon. Numerous colonic diverticulae are noted, without surrounding  inflammatory changes to suggest an acute diverticulitis at this time. The appendix is not confidently identified and may be surgically absent. Regardless, there are no inflammatory changes noted adjacent to the cecum to suggest the presence of an acute appendicitis at this time.  Vascular/Lymphatic: Extensive aortic atherosclerosis throughout the abdominal and pelvic vasculature, with vascular findings and measurements pertinent to potential TAVR procedure, as detailed below. No aneurysm or dissection noted in the abdomen or pelvis. Celiac axis, superior mesenteric artery and inferior mesenteric artery are all widely patent without hemodynamically significant stenosis. Bilateral renal arteries are widely patent. No lymphadenopathy noted in the abdomen or pelvis.  Reproductive: Numerous brachytherapy implants are noted throughout the prostate gland. Seminal vesicles are unremarkable in appearance.  Other: 2 tiny supraumbilical ventral hernias containing only omental fat. No significant volume of ascites. No pneumoperitoneum.  Musculoskeletal: There are no aggressive appearing lytic or blastic lesions noted in the visualized portions of the skeleton.  VASCULAR MEASUREMENTS PERTINENT TO TAVR:  AORTA:  Minimal Aortic Diameter -  13 x 13 mm  Severity of Aortic Calcification -  severe  RIGHT PELVIS:  Right Common Iliac Artery -  Minimal Diameter - 3.9 x 3.7 mm  Tortuosity - mild  Calcification - severe  Right External Iliac Artery -  Minimal Diameter - 7.1 x 6.9 mm  Tortuosity - mild  Calcification - mild to moderate  Right Common Femoral Artery -  Minimal Diameter - 8.8 x  5.8 mm  Tortuosity - mild  Calcification - moderate  LEFT PELVIS:  Left Common Iliac Artery -  Minimal Diameter - 8.1 x 8.0 mm  Tortuosity - moderate  Calcification - moderate  Left External Iliac Artery -  Minimal Diameter - 7.8 x 7.7 mm  Tortuosity -  moderate  Calcification - mild to moderate  Left Common Femoral Artery -  Minimal Diameter - 9.0 x 8.1 mm  Tortuosity - mild  Calcification - moderate  Review of the MIP images confirms the above findings.  IMPRESSION: 1. Vascular findings and measurements pertinent to potential TAVR procedure, as detailed above. The patient has suitable pelvic arterial access on the left side. 2. Severe thickening calcification of the aortic valve, compatible with the reported clinical history of severe aortic stenosis. 3. Colonic diverticulosis without evidence of acute diverticulitis at this time. 4. 6 mm subpleural nodule in the posterior aspect of the right upper lobe. Non-contrast chest CT at 6-12 months is recommended. If the nodule is stable at time of repeat CT, then future CT at 18-24 months (from today's scan) is considered optional for low-risk patients, but is recommended for high-risk patients. This recommendation follows the consensus statement: Guidelines for Management of Incidental Pulmonary Nodules Detected on CT Images: From the Fleischner Society 2017; Radiology 2017; 284:228-243. 5. Additional incidental findings, as above.   Electronically Signed   By: Vinnie Langton M.D.   On: 06/03/2017 15:48     RISK SCORES\pardAbout the STS Risk Calculator\pardProcedure: AV Replacement\cb3 Risk of Mortality: 10.772%\cb3 Morbidity or Mortality: 42.603%\cb3 Long Length of Stay: 21.098%\cb3 Short Length of Stay: 11.981%\cb3 Permanent Stroke: 2.805%\cb3 Prolonged Ventilation: 31.324%\cb3 DSW Infection: 0.9%\cb3 Renal Failure: 22.671%\cb3 Reoperation: 12.531% Impression:  This 83 year old gentleman has stage D severe, symptomatic aortic stenosis and severe LV dysfunction with NYHA class III symptoms of exertional shortness of breath and fatigue with mild exertion. I have personally reviewed and interpreted his echo, cath and CT studies. He has a trileaflet aortic valve with  moderately calcified and thickened leaflets with severe restriction of leaflet mobility. His LVEF is 25-30%. His mean gradient went from 25 mm Hg to 41 mm Hg with peak stress. He has a patent vein graft to his RCA territory and a patent LIMA to the LAD. I agree that AVR is indicated in this gentleman. He would be at high risk for redo sternotomy for open surgical AVR due to his age, redo status, severe LV systolic dysfunction, chronic kidney disease, reduced mobility and other comorbid medical conditions and I think TAVR would be a much better option for him. His cardiac CT shows anatomy favorable for TAVR using a Sapien 3 valve and his abdominal and pelvic CT shows anatomy suitable for transfemoral insertion.   The patient and his wife were counseled at length regarding treatment alternatives for management of severe symptomatic aortic stenosis. The risks and benefits of surgical intervention has been discussed in detail. Long-term prognosis with medical therapy was discussed. Alternative approaches such as conventional surgical aortic valve replacement, transcatheter aortic valve replacement, and palliative medical therapy were compared and contrasted at length. This discussion was placed in the context of the patient's own specific clinical presentation and past medical history. All of their questions been addressed. The patient is eager to proceed with surgical management as soon as possible.   Following the decision to proceed with transcatheter aortic valve replacement, a discussion was held regarding what types of management strategies would be attempted intraoperatively in the event of  life-threatening complications, including whether or not the patient would be considered a candidate for the use of cardiopulmonary bypass and/or conversion to open sternotomy for attempted surgical intervention.    The patient has been advised of a variety of complications that might develop including but not limited to  risks of death, stroke, paravalvular leak, aortic dissection or other major vascular complications, aortic annulus rupture, device embolization, cardiac rupture or perforation, mitral regurgitation, acute myocardial infarction, arrhythmia, heart block or bradycardia requiring permanent pacemaker placement, congestive heart failure, respiratory failure, renal failure, pneumonia, infection, other late complications related to structural valve deterioration or migration, or other complications that might ultimately cause a temporary or permanent loss of functional independence or other long term morbidity. The patient provides full informed consent for the procedure as described and all questions were answered.     Plan:  Transfemoral TAVR on Tuesday 06/22/2017. He will need hydration the morning of surgery and contrast to be limited as much as possible.    I spent 60 minutes performing this consultation and > 50% of this time was spent face to face counseling and coordinating the care of this patient's severe aortic stenosis.    Gaye Pollack, MD 06/17/2017

## 2017-06-21 MED ORDER — SODIUM CHLORIDE 0.9 % IV SOLN: INTRAVENOUS | Status: DC

## 2017-06-21 MED ORDER — SODIUM CHLORIDE 0.9 % IV SOLN
INTRAVENOUS | Status: DC
  Filled 2017-06-21: qty 1

## 2017-06-21 MED ORDER — CHLORHEXIDINE GLUCONATE 0.12 % MT SOLN
15.0000 mL | Freq: Once | OROMUCOSAL | Status: AC
  Administered 2017-06-22: 15 mL via OROMUCOSAL
  Filled 2017-06-21: qty 15

## 2017-06-21 MED ORDER — DOPAMINE-DEXTROSE 3.2-5 MG/ML-% IV SOLN
0.0000 ug/kg/min | INTRAVENOUS | Status: DC
  Filled 2017-06-21: qty 250

## 2017-06-21 MED ORDER — NITROGLYCERIN IN D5W 200-5 MCG/ML-% IV SOLN
2.0000 ug/min | INTRAVENOUS | Status: AC
  Administered 2017-06-22: 10 ug/min via INTRAVENOUS
  Filled 2017-06-21: qty 250

## 2017-06-21 MED ORDER — MAGNESIUM SULFATE 50 % IJ SOLN
40.0000 meq | INTRAMUSCULAR | Status: DC
  Filled 2017-06-21: qty 10

## 2017-06-21 MED ORDER — NOREPINEPHRINE BITARTRATE 1 MG/ML IV SOLN
0.0000 ug/min | INTRAVENOUS | Status: AC
  Administered 2017-06-22: 1 ug/min via INTRAVENOUS
  Filled 2017-06-21: qty 4

## 2017-06-21 MED ORDER — DEXTROSE 5 % IV SOLN
1.5000 g | INTRAVENOUS | Status: AC
  Administered 2017-06-22: 1.5 g via INTRAVENOUS
  Filled 2017-06-21: qty 1.5

## 2017-06-21 MED ORDER — VANCOMYCIN HCL 10 G IV SOLR
1500.0000 mg | INTRAVENOUS | Status: AC
  Administered 2017-06-22: 1500 mg via INTRAVENOUS
  Filled 2017-06-21: qty 1500

## 2017-06-21 MED ORDER — SODIUM CHLORIDE 0.9 % IV SOLN
INTRAVENOUS | Status: DC
  Filled 2017-06-21: qty 30

## 2017-06-21 MED ORDER — POTASSIUM CHLORIDE 2 MEQ/ML IV SOLN
80.0000 meq | INTRAVENOUS | Status: DC
  Filled 2017-06-21: qty 40

## 2017-06-21 MED ORDER — SODIUM CHLORIDE 0.9 % IV SOLN
30.0000 ug/min | INTRAVENOUS | Status: AC
  Administered 2017-06-22: 10 ug/min via INTRAVENOUS
  Filled 2017-06-21: qty 2

## 2017-06-21 MED ORDER — DEXTROSE 5 % IV SOLN
0.0000 ug/min | INTRAVENOUS | Status: DC
  Filled 2017-06-21: qty 4

## 2017-06-21 MED ORDER — DEXMEDETOMIDINE HCL IN NACL 400 MCG/100ML IV SOLN
0.1000 ug/kg/h | INTRAVENOUS | Status: AC
  Administered 2017-06-22: .5 ug/kg/h via INTRAVENOUS
  Filled 2017-06-21: qty 100

## 2017-06-22 ENCOUNTER — Encounter (HOSPITAL_COMMUNITY): Payer: Self-pay

## 2017-06-22 ENCOUNTER — Encounter (HOSPITAL_COMMUNITY)
Admission: RE | Disposition: A | Discharge status: Home / Self Care | Payer: Self-pay | Source: Ambulatory Visit | Attending: Cardiovascular Disease

## 2017-06-22 ENCOUNTER — Other Ambulatory Visit: Payer: Self-pay

## 2017-06-22 ENCOUNTER — Inpatient Hospital Stay (HOSPITAL_COMMUNITY): Payer: PPO

## 2017-06-22 ENCOUNTER — Inpatient Hospital Stay (HOSPITAL_COMMUNITY): Payer: PPO | Admitting: Vascular Surgery

## 2017-06-22 ENCOUNTER — Inpatient Hospital Stay (HOSPITAL_COMMUNITY)
Admission: RE | Admit: 2017-06-22 | Discharge: 2017-06-24 | DRG: 266 | Disposition: A | Discharge status: Home / Self Care | Payer: PPO | Source: Ambulatory Visit | Attending: Cardiovascular Disease | Admitting: Cardiovascular Disease

## 2017-06-22 DIAGNOSIS — M4800 Spinal stenosis, site unspecified: Secondary | ICD-10-CM | POA: Diagnosis present

## 2017-06-22 DIAGNOSIS — K219 Gastro-esophageal reflux disease without esophagitis: Secondary | ICD-10-CM | POA: Diagnosis present

## 2017-06-22 DIAGNOSIS — Z91013 Allergy to seafood: Secondary | ICD-10-CM | POA: Diagnosis not present

## 2017-06-22 DIAGNOSIS — Z954 Presence of other heart-valve replacement: Secondary | ICD-10-CM | POA: Diagnosis not present

## 2017-06-22 DIAGNOSIS — I130 Hypertensive heart and chronic kidney disease with heart failure and stage 1 through stage 4 chronic kidney disease, or unspecified chronic kidney disease: Secondary | ICD-10-CM | POA: Diagnosis present

## 2017-06-22 DIAGNOSIS — E1151 Type 2 diabetes mellitus with diabetic peripheral angiopathy without gangrene: Secondary | ICD-10-CM | POA: Diagnosis present

## 2017-06-22 DIAGNOSIS — E669 Obesity, unspecified: Secondary | ICD-10-CM | POA: Diagnosis present

## 2017-06-22 DIAGNOSIS — E1122 Type 2 diabetes mellitus with diabetic chronic kidney disease: Secondary | ICD-10-CM | POA: Diagnosis not present

## 2017-06-22 DIAGNOSIS — I5023 Acute on chronic systolic (congestive) heart failure: Secondary | ICD-10-CM | POA: Diagnosis not present

## 2017-06-22 DIAGNOSIS — I350 Nonrheumatic aortic (valve) stenosis: Secondary | ICD-10-CM

## 2017-06-22 DIAGNOSIS — N184 Chronic kidney disease, stage 4 (severe): Secondary | ICD-10-CM | POA: Diagnosis not present

## 2017-06-22 DIAGNOSIS — I10 Essential (primary) hypertension: Secondary | ICD-10-CM | POA: Diagnosis present

## 2017-06-22 DIAGNOSIS — E785 Hyperlipidemia, unspecified: Secondary | ICD-10-CM | POA: Diagnosis present

## 2017-06-22 DIAGNOSIS — E1169 Type 2 diabetes mellitus with other specified complication: Secondary | ICD-10-CM | POA: Diagnosis present

## 2017-06-22 DIAGNOSIS — I25810 Atherosclerosis of coronary artery bypass graft(s) without angina pectoris: Secondary | ICD-10-CM | POA: Diagnosis present

## 2017-06-22 DIAGNOSIS — I5022 Chronic systolic (congestive) heart failure: Secondary | ICD-10-CM | POA: Diagnosis present

## 2017-06-22 DIAGNOSIS — M25562 Pain in left knee: Secondary | ICD-10-CM | POA: Diagnosis not present

## 2017-06-22 DIAGNOSIS — N183 Chronic kidney disease, stage 3 unspecified: Secondary | ICD-10-CM | POA: Diagnosis present

## 2017-06-22 DIAGNOSIS — M109 Gout, unspecified: Secondary | ICD-10-CM | POA: Diagnosis not present

## 2017-06-22 DIAGNOSIS — M353 Polymyalgia rheumatica: Secondary | ICD-10-CM | POA: Diagnosis present

## 2017-06-22 DIAGNOSIS — E119 Type 2 diabetes mellitus without complications: Secondary | ICD-10-CM

## 2017-06-22 DIAGNOSIS — I2510 Atherosclerotic heart disease of native coronary artery without angina pectoris: Secondary | ICD-10-CM | POA: Diagnosis not present

## 2017-06-22 DIAGNOSIS — Z006 Encounter for examination for normal comparison and control in clinical research program: Secondary | ICD-10-CM | POA: Diagnosis not present

## 2017-06-22 DIAGNOSIS — Z85528 Personal history of other malignant neoplasm of kidney: Secondary | ICD-10-CM | POA: Diagnosis not present

## 2017-06-22 DIAGNOSIS — Z880 Allergy status to penicillin: Secondary | ICD-10-CM | POA: Diagnosis not present

## 2017-06-22 DIAGNOSIS — Z888 Allergy status to other drugs, medicaments and biological substances status: Secondary | ICD-10-CM

## 2017-06-22 DIAGNOSIS — Z905 Acquired absence of kidney: Secondary | ICD-10-CM | POA: Diagnosis not present

## 2017-06-22 DIAGNOSIS — C61 Malignant neoplasm of prostate: Secondary | ICD-10-CM | POA: Diagnosis not present

## 2017-06-22 DIAGNOSIS — I110 Hypertensive heart disease with heart failure: Secondary | ICD-10-CM | POA: Diagnosis not present

## 2017-06-22 DIAGNOSIS — Z952 Presence of prosthetic heart valve: Secondary | ICD-10-CM

## 2017-06-22 DIAGNOSIS — I5042 Chronic combined systolic (congestive) and diastolic (congestive) heart failure: Secondary | ICD-10-CM | POA: Diagnosis present

## 2017-06-22 HISTORY — PX: TEE WITHOUT CARDIOVERSION: SHX5443

## 2017-06-22 HISTORY — DX: Nonrheumatic aortic (valve) stenosis: I35.0

## 2017-06-22 HISTORY — PX: TRANSCATHETER AORTIC VALVE REPLACEMENT, TRANSFEMORAL: SHX6400

## 2017-06-22 HISTORY — DX: Malignant neoplasm of prostate: C61

## 2017-06-22 HISTORY — DX: Personal history of other malignant neoplasm of kidney: Z85.528

## 2017-06-22 HISTORY — DX: Chronic systolic (congestive) heart failure: I50.22

## 2017-06-22 HISTORY — DX: Type 2 diabetes mellitus without complications: E11.9

## 2017-06-22 LAB — POCT I-STAT 3, ART BLOOD GAS (G3+)
Acid-base deficit: 4 mmol/L — ABNORMAL HIGH (ref 0.0–2.0)
Acid-base deficit: 4 mmol/L — ABNORMAL HIGH (ref 0.0–2.0)
BICARBONATE: 20.6 mmol/L (ref 20.0–28.0)
Bicarbonate: 26.8 mmol/L (ref 20.0–28.0)
Bicarbonate: 22.6 mmol/L (ref 20.0–28.0)
O2 SAT: 94 %
O2 SAT: 97 %
O2 Saturation: 100 %
PCO2 ART: 51.6 mmHg — AB (ref 32.0–48.0)
PCO2 ART: 36 mmHg (ref 32.0–48.0)
PCO2 ART: 45.2 mmHg (ref 32.0–48.0)
PH ART: 7.324 — AB (ref 7.350–7.450)
PH ART: 7.305 — AB (ref 7.350–7.450)
PO2 ART: 98 mmHg (ref 83.0–108.0)
Patient temperature: 97.7
TCO2: 28 mmol/L (ref 0–100)
TCO2: 22 mmol/L (ref 0–100)
TCO2: 24 mmol/L (ref 0–100)
pH, Arterial: 7.367 (ref 7.350–7.450)
pO2, Arterial: 436 mmHg — ABNORMAL HIGH (ref 83.0–108.0)
pO2, Arterial: 77 mmHg — ABNORMAL LOW (ref 83.0–108.0)

## 2017-06-22 LAB — POCT I-STAT, CHEM 8
BUN: 43 mg/dL — ABNORMAL HIGH (ref 6–20)
BUN: 40 mg/dL — AB (ref 6–20)
BUN: 40 mg/dL — ABNORMAL HIGH (ref 6–20)
CALCIUM ION: 1.35 mmol/L (ref 1.15–1.40)
CALCIUM ION: 1.33 mmol/L (ref 1.15–1.40)
CHLORIDE: 107 mmol/L (ref 101–111)
CHLORIDE: 107 mmol/L (ref 101–111)
Calcium, Ion: 1.3 mmol/L (ref 1.15–1.40)
Chloride: 106 mmol/L (ref 101–111)
Creatinine, Ser: 1.4 mg/dL — ABNORMAL HIGH (ref 0.61–1.24)
Creatinine, Ser: 1.6 mg/dL — ABNORMAL HIGH (ref 0.61–1.24)
Creatinine, Ser: 1.6 mg/dL — ABNORMAL HIGH (ref 0.61–1.24)
GLUCOSE: 133 mg/dL — AB (ref 65–99)
Glucose, Bld: 150 mg/dL — ABNORMAL HIGH (ref 65–99)
Glucose, Bld: 155 mg/dL — ABNORMAL HIGH (ref 65–99)
HCT: 29 % — ABNORMAL LOW (ref 39.0–52.0)
HCT: 26 % — ABNORMAL LOW (ref 39.0–52.0)
HEMATOCRIT: 26 % — AB (ref 39.0–52.0)
HEMOGLOBIN: 9.9 g/dL — AB (ref 13.0–17.0)
Hemoglobin: 8.8 g/dL — ABNORMAL LOW (ref 13.0–17.0)
Hemoglobin: 8.8 g/dL — ABNORMAL LOW (ref 13.0–17.0)
POTASSIUM: 4.3 mmol/L (ref 3.5–5.1)
POTASSIUM: 4.3 mmol/L (ref 3.5–5.1)
Potassium: 4.8 mmol/L (ref 3.5–5.1)
SODIUM: 140 mmol/L (ref 135–145)
SODIUM: 140 mmol/L (ref 135–145)
SODIUM: 140 mmol/L (ref 135–145)
TCO2: 27 mmol/L (ref 0–100)
TCO2: 22 mmol/L (ref 0–100)
TCO2: 23 mmol/L (ref 0–100)

## 2017-06-22 LAB — POCT I-STAT 4, (NA,K, GLUC, HGB,HCT)
Glucose, Bld: 140 mg/dL — ABNORMAL HIGH (ref 65–99)
HEMATOCRIT: 29 % — AB (ref 39.0–52.0)
Hemoglobin: 9.9 g/dL — ABNORMAL LOW (ref 13.0–17.0)
Potassium: 4.1 mmol/L (ref 3.5–5.1)
SODIUM: 140 mmol/L (ref 135–145)

## 2017-06-22 LAB — GLUCOSE, CAPILLARY: GLUCOSE-CAPILLARY: 141 mg/dL — AB (ref 65–99)

## 2017-06-22 LAB — BASIC METABOLIC PANEL
Anion gap: 10 (ref 5–15)
BUN: 51 mg/dL — AB (ref 6–20)
CHLORIDE: 106 mmol/L (ref 101–111)
CO2: 23 mmol/L (ref 22–32)
Calcium: 10 mg/dL (ref 8.9–10.3)
Creatinine, Ser: 1.95 mg/dL — ABNORMAL HIGH (ref 0.61–1.24)
GFR calc Af Amer: 36 mL/min — ABNORMAL LOW (ref >60)
GFR calc non Af Amer: 31 mL/min — ABNORMAL LOW (ref >60)
GLUCOSE: 143 mg/dL — AB (ref 65–99)
POTASSIUM: 4.3 mmol/L (ref 3.5–5.1)
SODIUM: 139 mmol/L (ref 135–145)

## 2017-06-22 LAB — PREPARE RBC (CROSSMATCH)

## 2017-06-22 SURGERY — IMPLANTATION, AORTIC VALVE, TRANSCATHETER, FEMORAL APPROACH
Anesthesia: General | Site: Chest

## 2017-06-22 MED ORDER — ALLOPURINOL 300 MG PO TABS
300.0000 mg | ORAL_TABLET | Freq: Every day | ORAL | Status: DC
  Administered 2017-06-23 – 2017-06-24 (×2): 300 mg via ORAL
  Filled 2017-06-22 (×2): qty 1

## 2017-06-22 MED ORDER — SODIUM CHLORIDE 0.9 % IV SOLN
1.0000 mL/kg/h | INTRAVENOUS | Status: AC
  Administered 2017-06-22: 1 mL/kg/h via INTRAVENOUS

## 2017-06-22 MED ORDER — LACTATED RINGERS IV SOLN
INTRAVENOUS | Status: DC
  Administered 2017-06-22 (×2): via INTRAVENOUS

## 2017-06-22 MED ORDER — CHLORHEXIDINE GLUCONATE 4 % EX LIQD: 30.0000 mL | CUTANEOUS | Status: DC

## 2017-06-22 MED ORDER — ONDANSETRON HCL 4 MG/2ML IJ SOLN
4.0000 mg | Freq: Four times a day (QID) | INTRAMUSCULAR | Status: DC | PRN
Start: 2017-06-22 — End: 2017-06-23

## 2017-06-22 MED ORDER — SODIUM CHLORIDE 0.9 % IV SOLN: 250.0000 mL | INTRAVENOUS | Status: DC | PRN

## 2017-06-22 MED ORDER — PHENYLEPHRINE HCL 10 MG/ML IJ SOLN
INTRAMUSCULAR | Status: DC | PRN
  Administered 2017-06-22: 40 ug via INTRAVENOUS

## 2017-06-22 MED ORDER — FENTANYL CITRATE (PF) 100 MCG/2ML IJ SOLN
INTRAMUSCULAR | Status: DC | PRN
  Administered 2017-06-22 (×2): 50 ug via INTRAVENOUS

## 2017-06-22 MED ORDER — SODIUM CHLORIDE 0.9% FLUSH: 3.0000 mL | INTRAVENOUS | Status: DC | PRN

## 2017-06-22 MED ORDER — TAMSULOSIN HCL 0.4 MG PO CAPS
0.4000 mg | ORAL_CAPSULE | Freq: Every day | ORAL | Status: DC
  Administered 2017-06-22 – 2017-06-23 (×2): 0.4 mg via ORAL
  Filled 2017-06-22 (×2): qty 1

## 2017-06-22 MED ORDER — LIDOCAINE 2% (20 MG/ML) 5 ML SYRINGE
INTRAMUSCULAR | Status: AC
  Filled 2017-06-22: qty 5

## 2017-06-22 MED ORDER — LACTATED RINGERS IV SOLN: 500.0000 mL | Freq: Once | INTRAVENOUS | Status: DC | PRN

## 2017-06-22 MED ORDER — ROCURONIUM BROMIDE 10 MG/ML (PF) SYRINGE
PREFILLED_SYRINGE | INTRAVENOUS | Status: AC
  Filled 2017-06-22: qty 5

## 2017-06-22 MED ORDER — CHLORHEXIDINE GLUCONATE 4 % EX LIQD: 60.0000 mL | Freq: Once | CUTANEOUS | Status: DC

## 2017-06-22 MED ORDER — SODIUM CHLORIDE 0.9% FLUSH: 10.0000 mL | INTRAVENOUS | Status: DC | PRN

## 2017-06-22 MED ORDER — CLOPIDOGREL BISULFATE 75 MG PO TABS
75.0000 mg | ORAL_TABLET | Freq: Every day | ORAL | Status: DC
  Administered 2017-06-23 – 2017-06-24 (×2): 75 mg via ORAL
  Filled 2017-06-22 (×2): qty 1

## 2017-06-22 MED ORDER — LACTATED RINGERS IV SOLN
INTRAVENOUS | Status: DC | PRN
  Administered 2017-06-22: 09:00:00 via INTRAVENOUS

## 2017-06-22 MED ORDER — MORPHINE SULFATE (PF) 2 MG/ML IV SOLN: 2.0000 mg | INTRAVENOUS | Status: DC | PRN

## 2017-06-22 MED ORDER — HEPARIN SODIUM (PORCINE) 1000 UNIT/ML IJ SOLN
INTRAMUSCULAR | Status: DC | PRN
  Administered 2017-06-22: 10000 [IU] via INTRAVENOUS

## 2017-06-22 MED ORDER — SUCCINYLCHOLINE CHLORIDE 20 MG/ML IJ SOLN
INTRAMUSCULAR | Status: DC | PRN
  Administered 2017-06-22: 100 mg via INTRAVENOUS

## 2017-06-22 MED ORDER — OXYCODONE HCL 5 MG PO TABS
5.0000 mg | ORAL_TABLET | ORAL | Status: DC | PRN
  Administered 2017-06-22 – 2017-06-23 (×6): 10 mg via ORAL
  Filled 2017-06-22 (×6): qty 2

## 2017-06-22 MED ORDER — SUGAMMADEX SODIUM 200 MG/2ML IV SOLN
INTRAVENOUS | Status: DC | PRN
  Administered 2017-06-22: 180 mg via INTRAVENOUS

## 2017-06-22 MED ORDER — VANCOMYCIN HCL IN DEXTROSE 1-5 GM/200ML-% IV SOLN
1000.0000 mg | Freq: Once | INTRAVENOUS | Status: AC
  Administered 2017-06-22: 1000 mg via INTRAVENOUS
  Filled 2017-06-22: qty 200

## 2017-06-22 MED ORDER — INSULIN ASPART 100 UNIT/ML ~~LOC~~ SOLN
0.0000 [IU] | SUBCUTANEOUS | Status: DC
  Administered 2017-06-22: 3 [IU] via SUBCUTANEOUS
  Administered 2017-06-23: 2 [IU] via SUBCUTANEOUS

## 2017-06-22 MED ORDER — 0.9 % SODIUM CHLORIDE (POUR BTL) OPTIME
TOPICAL | Status: DC | PRN
  Administered 2017-06-22: 1000 mL

## 2017-06-22 MED ORDER — LEVOFLOXACIN IN D5W 750 MG/150ML IV SOLN
750.0000 mg | INTRAVENOUS | Status: AC
  Administered 2017-06-23: 750 mg via INTRAVENOUS
  Filled 2017-06-22: qty 150

## 2017-06-22 MED ORDER — PROPOFOL 10 MG/ML IV BOLUS
INTRAVENOUS | Status: AC
  Filled 2017-06-22: qty 20

## 2017-06-22 MED ORDER — FAMOTIDINE IN NACL 20-0.9 MG/50ML-% IV SOLN: 20.0000 mg | Freq: Two times a day (BID) | INTRAVENOUS | Status: DC

## 2017-06-22 MED ORDER — MIDAZOLAM HCL 2 MG/2ML IJ SOLN: 2.0000 mg | INTRAMUSCULAR | Status: DC | PRN

## 2017-06-22 MED ORDER — METOPROLOL TARTRATE 12.5 MG HALF TABLET
12.5000 mg | ORAL_TABLET | Freq: Two times a day (BID) | ORAL | Status: DC
  Administered 2017-06-22: 12.5 mg via ORAL
  Filled 2017-06-22: qty 1

## 2017-06-22 MED ORDER — HEPARIN SODIUM (PORCINE) 5000 UNIT/ML IJ SOLN
INTRAMUSCULAR | Status: DC | PRN
  Administered 2017-06-22: 1500 mL

## 2017-06-22 MED ORDER — MORPHINE SULFATE (PF) 4 MG/ML IV SOLN: 2.0000 mg | INTRAVENOUS | Status: DC | PRN

## 2017-06-22 MED ORDER — FENTANYL CITRATE (PF) 250 MCG/5ML IJ SOLN
INTRAMUSCULAR | Status: AC
  Filled 2017-06-22: qty 5

## 2017-06-22 MED ORDER — METOPROLOL TARTRATE 5 MG/5ML IV SOLN: 2.5000 mg | INTRAVENOUS | Status: DC | PRN

## 2017-06-22 MED ORDER — ASPIRIN 81 MG PO CHEW
81.0000 mg | CHEWABLE_TABLET | Freq: Every evening | ORAL | Status: DC
  Administered 2017-06-23: 81 mg via ORAL
  Filled 2017-06-22: qty 1

## 2017-06-22 MED ORDER — SODIUM CHLORIDE 0.9 % IV SOLN
0.0000 ug/min | INTRAVENOUS | Status: DC
  Filled 2017-06-22: qty 2

## 2017-06-22 MED ORDER — CARVEDILOL 3.125 MG PO TABS: 3.1250 mg | ORAL_TABLET | Freq: Two times a day (BID) | ORAL | Status: DC

## 2017-06-22 MED ORDER — HEPARIN SODIUM (PORCINE) 1000 UNIT/ML IJ SOLN
INTRAMUSCULAR | Status: AC
  Filled 2017-06-22: qty 1

## 2017-06-22 MED ORDER — CHLORHEXIDINE GLUCONATE CLOTH 2 % EX PADS
6.0000 | MEDICATED_PAD | Freq: Every day | CUTANEOUS | Status: DC
  Administered 2017-06-22: 6 via TOPICAL

## 2017-06-22 MED ORDER — ONDANSETRON HCL 4 MG/2ML IJ SOLN
INTRAMUSCULAR | Status: DC | PRN
  Administered 2017-06-22: 4 mg via INTRAVENOUS

## 2017-06-22 MED ORDER — SODIUM CHLORIDE 0.9% FLUSH: 3.0000 mL | Freq: Two times a day (BID) | INTRAVENOUS | Status: DC

## 2017-06-22 MED ORDER — ROSUVASTATIN CALCIUM 10 MG PO TABS
10.0000 mg | ORAL_TABLET | Freq: Every day | ORAL | Status: DC
  Administered 2017-06-22 – 2017-06-23 (×2): 10 mg via ORAL
  Filled 2017-06-22 (×2): qty 1

## 2017-06-22 MED ORDER — PROPOFOL 10 MG/ML IV BOLUS
INTRAVENOUS | Status: DC | PRN
  Administered 2017-06-22: 100 mg via INTRAVENOUS

## 2017-06-22 MED ORDER — LIDOCAINE HCL (PF) 1 % IJ SOLN
INTRAMUSCULAR | Status: AC
  Filled 2017-06-22: qty 30

## 2017-06-22 MED ORDER — ORAL CARE MOUTH RINSE
15.0000 mL | Freq: Two times a day (BID) | OROMUCOSAL | Status: DC
  Administered 2017-06-22 – 2017-06-23 (×2): 15 mL via OROMUCOSAL

## 2017-06-22 MED ORDER — IODIXANOL 320 MG/ML IV SOLN
INTRAVENOUS | Status: DC | PRN
  Administered 2017-06-22: 30.6 mL via INTRA_ARTERIAL

## 2017-06-22 MED ORDER — ALBUMIN HUMAN 5 % IV SOLN: 250.0000 mL | INTRAVENOUS | Status: DC | PRN

## 2017-06-22 MED ORDER — TRAMADOL HCL 50 MG PO TABS: 50.0000 mg | ORAL_TABLET | ORAL | Status: DC | PRN

## 2017-06-22 MED ORDER — ONDANSETRON HCL 4 MG/2ML IJ SOLN
INTRAMUSCULAR | Status: AC
  Filled 2017-06-22: qty 2

## 2017-06-22 MED ORDER — PROTAMINE SULFATE 10 MG/ML IV SOLN
INTRAVENOUS | Status: AC
  Filled 2017-06-22: qty 10

## 2017-06-22 MED ORDER — ADULT MULTIVITAMIN W/MINERALS CH
1.0000 | ORAL_TABLET | Freq: Every day | ORAL | Status: DC
  Administered 2017-06-23 – 2017-06-24 (×2): 1 via ORAL
  Filled 2017-06-22 (×2): qty 1

## 2017-06-22 MED ORDER — LACTATED RINGERS IV SOLN
INTRAVENOUS | Status: DC | PRN
  Administered 2017-06-22: 08:00:00 via INTRAVENOUS

## 2017-06-22 MED ORDER — CHLORHEXIDINE GLUCONATE 0.12 % MT SOLN
15.0000 mL | OROMUCOSAL | Status: AC
Start: 2017-06-22 — End: 2017-06-22
  Administered 2017-06-22: 15 mL via OROMUCOSAL

## 2017-06-22 MED ORDER — METOPROLOL TARTRATE 25 MG/10 ML ORAL SUSPENSION: 12.5000 mg | Freq: Two times a day (BID) | ORAL | Status: DC

## 2017-06-22 MED ORDER — ROCURONIUM BROMIDE 100 MG/10ML IV SOLN
INTRAVENOUS | Status: DC | PRN
  Administered 2017-06-22: 40 mg via INTRAVENOUS

## 2017-06-22 MED ORDER — NITROGLYCERIN IN D5W 200-5 MCG/ML-% IV SOLN: 0.0000 ug/min | INTRAVENOUS | Status: DC

## 2017-06-22 MED ORDER — SODIUM CHLORIDE 0.9% FLUSH
10.0000 mL | Freq: Two times a day (BID) | INTRAVENOUS | Status: DC
  Administered 2017-06-22: 10 mL

## 2017-06-22 MED ORDER — PROTAMINE SULFATE 10 MG/ML IV SOLN
INTRAVENOUS | Status: DC | PRN
  Administered 2017-06-22: 25 mg via INTRAVENOUS
  Administered 2017-06-22: 10 mg via INTRAVENOUS
  Administered 2017-06-22: 20 mg via INTRAVENOUS
  Administered 2017-06-22: 10 mg via INTRAVENOUS
  Administered 2017-06-22: 25 mg via INTRAVENOUS
  Administered 2017-06-22: 10 mg via INTRAVENOUS

## 2017-06-22 SURGICAL SUPPLY — 101 items
ADAPTER UNIV SWAN GANZ BIP (ADAPTER) ×2 IMPLANT
ADAPTER UNV SWAN GANZ BIP (ADAPTER) ×1
ATTRACTOMAT 16X20 MAGNETIC DRP (DRAPES) IMPLANT
BAG BANDED W/RUBBER/TAPE 36X54 (MISCELLANEOUS) ×3 IMPLANT
BAG DECANTER FOR FLEXI CONT (MISCELLANEOUS) IMPLANT
BAG SNAP BAND KOVER 36X36 (MISCELLANEOUS) ×6 IMPLANT
BLADE 10 SAFETY STRL DISP (BLADE) ×3 IMPLANT
BLADE CLIPPER SURG (BLADE) IMPLANT
BLADE STERNUM SYSTEM 6 (BLADE) ×3 IMPLANT
CABLE ADAPT CONN TEMP 6FT (ADAPTER) ×3 IMPLANT
CABLE PACING FASLOC BIEGE (MISCELLANEOUS) ×3 IMPLANT
CABLE PACING FASLOC BLUE (MISCELLANEOUS) ×3 IMPLANT
CANISTER SUCT 3000ML PPV (MISCELLANEOUS) IMPLANT
CANNULA FEM VENOUS REMOTE 22FR (CANNULA) IMPLANT
CANNULA OPTISITE PERFUSION 16F (CANNULA) IMPLANT
CANNULA OPTISITE PERFUSION 18F (CANNULA) IMPLANT
CATH DIAG EXPO 6F VENT PIG 145 (CATHETERS) ×6 IMPLANT
CATH EXPO 5FR AL1 (CATHETERS) ×3 IMPLANT
CATH S G BIP PACING (SET/KITS/TRAYS/PACK) ×6 IMPLANT
CLIP TI MEDIUM 24 (CLIP) ×3 IMPLANT
CLIP TI WIDE RED SMALL 24 (CLIP) ×3 IMPLANT
CONT SPEC 4OZ CLIKSEAL STRL BL (MISCELLANEOUS) ×6 IMPLANT
COVER BACK TABLE 60X90IN (DRAPES) ×3 IMPLANT
COVER BACK TABLE 80X110 HD (DRAPES) ×3 IMPLANT
COVER DOME SNAP 22 D (MISCELLANEOUS) ×3 IMPLANT
COVER MAYO STAND STRL (DRAPES) ×3 IMPLANT
CRADLE DONUT ADULT HEAD (MISCELLANEOUS) ×3 IMPLANT
DERMABOND ADVANCED (GAUZE/BANDAGES/DRESSINGS) ×1
DERMABOND ADVANCED .7 DNX12 (GAUZE/BANDAGES/DRESSINGS) ×2 IMPLANT
DEVICE CLOSURE PERCLS PRGLD 6F (VASCULAR PRODUCTS) IMPLANT
DRAPE INCISE IOBAN 66X45 STRL (DRAPES) IMPLANT
DRAPE SLUSH MACHINE 52X66 (DRAPES) ×3 IMPLANT
DRSG TEGADERM 4X4.75 (GAUZE/BANDAGES/DRESSINGS) ×6 IMPLANT
ELECT REM PT RETURN 9FT ADLT (ELECTROSURGICAL) ×6
ELECTRODE REM PT RTRN 9FT ADLT (ELECTROSURGICAL) ×4 IMPLANT
FELT TEFLON 6X6 (MISCELLANEOUS) ×3 IMPLANT
FEMORAL VENOUS CANN RAP (CANNULA) IMPLANT
GAUZE SPONGE 4X4 12PLY STRL (GAUZE/BANDAGES/DRESSINGS) ×3 IMPLANT
GAUZE SPONGE 4X4 12PLY STRL LF (GAUZE/BANDAGES/DRESSINGS) ×6 IMPLANT
GLOVE BIO SURGEON STRL SZ7.5 (GLOVE) ×3 IMPLANT
GLOVE BIO SURGEON STRL SZ8 (GLOVE) ×6 IMPLANT
GLOVE EUDERMIC 7 POWDERFREE (GLOVE) ×3 IMPLANT
GLOVE ORTHO TXT STRL SZ7.5 (GLOVE) ×3 IMPLANT
GOWN STRL REUS W/ TWL LRG LVL3 (GOWN DISPOSABLE) ×6 IMPLANT
GOWN STRL REUS W/ TWL XL LVL3 (GOWN DISPOSABLE) ×12 IMPLANT
GOWN STRL REUS W/TWL LRG LVL3 (GOWN DISPOSABLE) ×3
GOWN STRL REUS W/TWL XL LVL3 (GOWN DISPOSABLE) ×6
GUIDEWIRE SAF TJ AMPL .035X180 (WIRE) ×3 IMPLANT
GUIDEWIRE SAFE TJ AMPLATZ EXST (WIRE) ×3 IMPLANT
GUIDEWIRE STRAIGHT .035 260CM (WIRE) ×3 IMPLANT
INSERT FOGARTY 61MM (MISCELLANEOUS) ×3 IMPLANT
INSERT FOGARTY SM (MISCELLANEOUS) IMPLANT
INSERT FOGARTY XLG (MISCELLANEOUS) IMPLANT
KIT BASIN OR (CUSTOM PROCEDURE TRAY) ×3 IMPLANT
KIT DILATOR VASC 18G NDL (KITS) IMPLANT
KIT HEART LEFT (KITS) ×3 IMPLANT
KIT ROOM TURNOVER OR (KITS) ×3 IMPLANT
KIT SUCTION CATH 14FR (SUCTIONS) ×6 IMPLANT
NEEDLE 22X1 1/2 (OR ONLY) (NEEDLE) ×3 IMPLANT
NEEDLE PERC 18GX7CM (NEEDLE) ×3 IMPLANT
NS IRRIG 1000ML POUR BTL (IV SOLUTION) ×9 IMPLANT
PACK AORTA (CUSTOM PROCEDURE TRAY) ×3 IMPLANT
PAD ARMBOARD 7.5X6 YLW CONV (MISCELLANEOUS) ×6 IMPLANT
PAD ELECT DEFIB RADIOL ZOLL (MISCELLANEOUS) ×3 IMPLANT
PATCH TACHOSII LRG 9.5X4.8 (VASCULAR PRODUCTS) IMPLANT
PERCLOSE PROGLIDE 6F (VASCULAR PRODUCTS)
SET MICROPUNCTURE 5F STIFF (MISCELLANEOUS) ×3 IMPLANT
SHEATH AVANTI 11CM 8FR (MISCELLANEOUS) ×3 IMPLANT
SHEATH PINNACLE 6F 10CM (SHEATH) ×6 IMPLANT
SLEEVE REPOSITIONING LENGTH 30 (MISCELLANEOUS) ×3 IMPLANT
SPONGE LAP 4X18 X RAY DECT (DISPOSABLE) ×3 IMPLANT
STOPCOCK MORSE 400PSI 3WAY (MISCELLANEOUS) ×18 IMPLANT
SUT ETHIBOND X763 2 0 SH 1 (SUTURE) IMPLANT
SUT GORETEX CV 4 TH 22 36 (SUTURE) IMPLANT
SUT GORETEX CV4 TH-18 (SUTURE) IMPLANT
SUT GORETEX TH-18 36 INCH (SUTURE) IMPLANT
SUT MNCRL AB 3-0 PS2 18 (SUTURE) IMPLANT
SUT PROLENE 3 0 SH1 36 (SUTURE) IMPLANT
SUT PROLENE 4 0 RB 1 (SUTURE)
SUT PROLENE 4-0 RB1 .5 CRCL 36 (SUTURE) IMPLANT
SUT PROLENE 5 0 C 1 36 (SUTURE) IMPLANT
SUT PROLENE 6 0 C 1 30 (SUTURE) IMPLANT
SUT SILK  1 MH (SUTURE) ×1
SUT SILK 1 MH (SUTURE) ×2 IMPLANT
SUT SILK 2 0 SH CR/8 (SUTURE) IMPLANT
SUT VIC AB 2-0 CT1 27 (SUTURE)
SUT VIC AB 2-0 CT1 TAPERPNT 27 (SUTURE) IMPLANT
SUT VIC AB 2-0 CTX 36 (SUTURE) IMPLANT
SUT VIC AB 3-0 SH 8-18 (SUTURE) IMPLANT
SYR 10ML LL (SYRINGE) ×9 IMPLANT
SYR 30ML LL (SYRINGE) ×6 IMPLANT
SYR 50ML LL SCALE MARK (SYRINGE) ×3 IMPLANT
SYR CONTROL 10ML LL (SYRINGE) ×3 IMPLANT
TOWEL OR 17X26 10 PK STRL BLUE (TOWEL DISPOSABLE) ×6 IMPLANT
TRANSDUCER W/STOPCOCK (MISCELLANEOUS) ×6 IMPLANT
TRAY FOLEY SILVER 14FR TEMP (SET/KITS/TRAYS/PACK) ×3 IMPLANT
TUBE SUCT INTRACARD DLP 20F (MISCELLANEOUS) IMPLANT
TUBING HIGH PRESSURE 120CM (CONNECTOR) ×3 IMPLANT
VALVE HEART TRANSCATH SZ3 29MM (Prosthesis & Implant Heart) ×3 IMPLANT
WIRE AMPLATZ SS-J .035X180CM (WIRE) ×3 IMPLANT
WIRE BENTSON .035X145CM (WIRE) ×3 IMPLANT

## 2017-06-22 NOTE — Anesthesia Postprocedure Evaluation (Signed)
Anesthesia Post Note  Patient: [name]  Procedure(s) Performed: Procedure(s) (LRB): TRANSCATHETER AORTIC VALVE REPLACEMENT, TRANSFEMORAL (N/A) TRANSESOPHAGEAL ECHOCARDIOGRAM (TEE) (N/A)     Patient location during evaluation: SICU Anesthesia Type: General Level of consciousness: awake, sedated and oriented Pain management: pain level controlled Vital Signs Assessment: post-procedure vital signs reviewed and stable Respiratory status: spontaneous breathing, nonlabored ventilation, respiratory function stable and patient connected to nasal cannula oxygen Cardiovascular status: blood pressure returned to baseline and stable Postop Assessment: no signs of nausea or vomiting Anesthetic complications: no    Last Vitals:  Vitals:   06/22/17 1545 06/22/17 1600  BP: (!) 131/59 126/63  Pulse: 82 73  Resp: 17 (!) 22  Temp:      Last Pain:  Vitals:   06/22/17 1545  TempSrc:   PainSc: 6                  [name],JAMES TERRILL

## 2017-06-22 NOTE — Progress Notes (Signed)
  Echocardiogram Echocardiogram Transesophageal has been performed.  [name] 06/22/2017, 10:38 AM

## 2017-06-22 NOTE — Anesthesia Procedure Notes (Signed)
Arterial Line Insertion Start/End7/02/2017 7:24 AM, 06/22/2017 7:32 AM Performed by: Barrington Ellison, CRNA  Patient location: Pre-op. Preanesthetic checklist: patient identified and IV checked Lidocaine 1% used for infiltration and patient sedated Right, radial was placed Catheter size: 20 G Hand hygiene performed  and maximum sterile barriers used  Allen's test indicative of satisfactory collateral circulation Attempts: 1 Procedure performed without using ultrasound guided technique. Following insertion, dressing applied. Post procedure assessment: normal  Patient tolerated the procedure well with no immediate complications.

## 2017-06-22 NOTE — Anesthesia Procedure Notes (Signed)
Central Venous Catheter Insertion Performed by: Effie Berkshire, anesthesiologist Start/End7/02/2017 7:40 AM, 06/22/2017 7:50 AM Patient location: Pre-op. Preanesthetic checklist: patient identified, IV checked, site marked, risks and benefits discussed, surgical consent, monitors and equipment checked, pre-op evaluation, timeout performed and anesthesia consent Position: Trendelenburg Lidocaine 1% used for infiltration and patient sedated Hand hygiene performed , maximum sterile barriers used  and Seldinger technique used Catheter size: 8 Fr Total catheter length 16. Central line was placed.Double lumen Procedure performed using ultrasound guided technique. Ultrasound Notes:anatomy identified, needle tip was noted to be adjacent to the nerve/plexus identified, no ultrasound evidence of intravascular and/or intraneural injection and image(s) printed for medical record Attempts: 1 Following insertion, dressing applied, line sutured and Biopatch. Post procedure assessment: blood return through all ports  Patient tolerated the procedure well with no immediate complications.

## 2017-06-22 NOTE — H&P (Signed)
[address]       [place] [address]             [phone]      Cardiothoracic Surgery Admission History and Physical   Referring Provider is Larey Dresser, MD  PCP is Lajean Manes, MD      Chief Complaint  Patient presents with  . Aortic Stenosis       HPI:  The patient is an 83 year old gentleman with DM, hypertension, stage 4 chronic kidney disease, spinal stenosis s/p surgery complicated by septic shock, CAD s/p CABG in 2007 by Dr. Servando Snare, and aortic stenosis. He notes that for the past 1-2 years he has developed progressive shortness of breath and mild chest discomfort with exertion. He had an echo on 03/29/2017 that showed a mean aortic valve gradient of 26 mm Hg and a peak of 42 mm Hg. The valve was trileaflet with moderately calcified and thickened leaflets with severe restriction of leaflet mobility. His LVEF was 25-30%. His mean gradient was 19 mm Hg on the prior echo in 09/2016 and his EF was about the same. He had a cath in 10/2016 showing severe native vessel coronary artery disease with total occlusion of the LM and native RCA. The vein graft to the Ramus was occluded. There was a widely patent vein graft to PDA and PL and the LIMA to the LAD was patent. He had a dobutamine Stress echo on 04/26/2017 that showed an increase in the mean gradient from 25 at baseline to 41 mm Hg at peak dobutamine.  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 He has a hx of prostate cancer diagnosed about 4 yrs ago and treated with radioactive seeds. He also was diagnosed with renal cell cancer two years ago and had a partial right nephrectomy.      Past Medical History:  Diagnosis Date  . Arthritis    RA & OA- hands, back, neck- achiness   . Azotemia   . Cancer [facility]) 2010   prostate, renal   . Chondromalacia   . Coronary artery disease   . Diabetes mellitus without complication ([place])   . Dyspnea   . GERD (gastroesophageal reflux  disease)   . Glaucoma    "high normal"  . Gout   . H/O blood clots    R Lower leg, right upper leg  . Heart murmur   . Heel spur   . Hyperlipidemia   . Hypertension   . Joint pain   . Lesion of left native kidney   . Low back pain    "severe"  . Mild aortic stenosis   . Myocardial infarction [facility])    "told me I had a small heart attack"  . Obesity   . Osteopenia   . Osteoporosis   . Peripheral vascular disease ([place]) 2012   R leg- treated with oral blood thinner  . Polymyalgia ([place])    h/o  . Renal cyst, left   . Shingles    LEFT ABDOMEN  . Spinal stenosis   . Thrombophlebitis   . Tinnitus   . Tubular adenoma         Past Surgical History:  Procedure Laterality Date  . APPENDECTOMY  60 years ago  . BACK SURGERY    . CARDIAC CATHETERIZATION    . CARDIAC CATHETERIZATION N/A 11/03/2016   Procedure: Right/Left Heart Cath and Coronary/Graft Angiography; Surgeon:  Belva Crome, MD; Location: [facility]; Service: Cardiovascular; Laterality: N/A;  . CORONARY ARTERY BYPASS GRAFT  2007  . EYE SURGERY Right    /w IOL, post cataracts   . PROSTATE SURGERY     seed implant  . ROBOTIC ASSITED PARTIAL NEPHRECTOMY Left 08/31/2013   Procedure: ROBOTIC ASSITED PARTIAL NEPHRECTOMY; Surgeon: Dutch Gray, MD; Location: [facility]; Service: Urology; Laterality: Left;  Marland Kitchen VEIN LIGATION AND STRIPPING Right 1972   right lower leg        Family History  Problem Relation Age of Onset  . Heart Problems Mother   . Heart Problems Father    Social History        Social History  . Marital status: Married    Spouse name: N/A  . Number of children: N/A  . Years of education: N/A      Occupational History  . Not on file.        Social History Main Topics  . Smoking status: Former Smoker    Packs/day: 3.00    Years: 30.00    Types: Cigarettes    Quit date: 12/21/1980  . Smokeless tobacco: Never Used  . Alcohol use No  . Drug use: No  . Sexual activity: Not on file         Other Topics Concern  . Not on file      Social History Narrative  . No narrative on file         Current Outpatient Prescriptions  Medication Sig Dispense Refill  . alendronate (FOSAMAX) 70 MG tablet Take 70 mg by mouth once a week. Take with a full glass of water on an empty stomach.    Marland Kitchen allopurinol (ZYLOPRIM) 300 MG tablet Take 300 mg by mouth daily.  2  . aspirin 81 MG chewable tablet Chew 81 mg by mouth every evening.     . Calcium Carbonate-Vitamin D 600-200 MG-UNIT TABS Take 1 tablet by mouth daily.    . carvedilol (COREG) 3.125 MG tablet TAKE 1 TABLET BY MOUTH TWICE DAILY 60 tablet 0  . diclofenac sodium (VOLTAREN) 1 % GEL Apply 2 g topically daily as needed (pain).    Marland Kitchen glipiZIDE (GLUCOTROL) 10 MG tablet Take 5-10 mg by mouth 2 (two) times daily before a meal. 10 mg in the morning and 5 mg in the evening  2  . HYDROcodone-acetaminophen (NORCO) 10-325 MG tablet Take 1-2 tablets by mouth every 6 (six) hours as needed for moderate pain. (Patient taking differently: Take 1-2 tablets by mouth 3 (three) times daily as needed for moderate pain. Depends on pain level if takes 1-2 tablets) 10 tablet 0  . Liniments (SALONPAS PAIN RELIEF PATCH EX) Apply 1 patch topically daily as needed (pain).    . Magnesium Oxide 200 MG TABS Take 1 tablet (200 mg total) by mouth daily. 30 tablet 6  . Multiple Vitamin (MULTIVITAMIN) tablet Take 1 tablet by mouth daily.    . rosuvastatin (CRESTOR) 10 MG tablet Take 10 mg by mouth daily.    . tamsulosin (FLOMAX) 0.4 MG CAPS capsule Take 0.4 mg by mouth daily after supper.    . torsemide (DEMADEX) 20 MG tablet Take 1 tablet (20 mg total) by mouth daily. (Patient taking differently: Take 20 mg by mouth every evening. ) 45 tablet 3   No current facility-administered medications for this visit.         Allergies  Allergen Reactions  . Adacel [Diphth-Acell Pertussis-Tetanus]  Pt not sure of reaction  . Fish Allergy Swelling    Dark fish- salmon, tuna    . Flexeril [Cyclobenzaprine]     Causes confusion  . Lasix [Furosemide] Swelling  . Lipitor [Atorvastatin] Other (See Comments)    Muscle pain  . Pantoprazole Diarrhea  . Penicillins Swelling    UNKNOWN  Swelling of the feet  Has patient had a PCN reaction causing immediate rash, facial/tongue/throat swelling, SOB or lightheadedness with hypotension: no  Has patient had a PCN reaction causing severe rash involving mucus membranes or skin necrosis: {no  Has patient had a PCN reaction that required hospitalization {no  Has patient had a PCN reaction occurring within the last 10 years: {no  If all of the above answers are "NO", then may proceed with Cephalosporin use.  Sarina Ill [Sulfamethoxazole-Trimethoprim]     Pt unsure of reaction   . Simvastatin     MUSCLE PAIN   Review of Systems:  General: good appetite, reduced energy, no weight gain, some weight loss, no fever  Cardiac: has chest pain with exertion, no chest pain at rest, has SOB with mild exertion, no resting SOB, no PND, some orthopnea, no palpitations, no arrhythmia, no atrial fibrillation, some LE edema, no dizzy spells, no syncope  Respiratory: exertional shortness of breath, no home oxygen, no productive cough, no dry cough, no bronchitis, no wheezing, no hemoptysis, no asthma, no pain with inspiration or cough, no sleep apnea, no CPAP at night  GI: no difficulty swallowing, no reflux, no frequent heartburn, no hiatal hernia, no abdominal pain, no constipation, no diarrhea, no hematochezia, no hematemesis, no melena  GU: no dysuria, no frequency, no urinary tract infection, no hematuria, no enlarged prostate, no kidney stones, hx of partial nephrectomy for renal cell cancer  Vascular: no pain suggestive of claudication, no pain in feet, no leg cramps, no varicose veins, history of DVT, no non-healing foot ulcer  Neuro: no stroke, no TIA's, no seizures, no headaches, no temporary blindness one eye, no slurred speech, no  peripheral neuropathy, has chronic pain, has instability of gait, no memory/cognitive dysfunction  Musculoskeletal: has arthritis, no joint swelling, no myalgias, has difficulty walking, reduced mobility  Skin: no rash, no itching, no skin infections, no pressure sores or ulcerations  Psych: no anxiety, no depression, no nervousness, no unusual recent stress  Eyes: no blurry vision, no floaters, no recent vision changes wears glasses  ENT: no hearing loss, no loose or painful teeth, no dentures, last saw dentist this year  Hematologic: no easy bruising, no abnormal bleeding, no clotting disorder, no frequent epistaxis  Endocrine: has diabetes, does check CBG's at home  Physical Exam:  BP (!) 95/55 (BP Location: Right Arm, Patient Position: Sitting, Cuff Size: Large)  Pulse 74 Comment: ON RA  Resp 16  Ht 5' 6.5" (1.689 m)  Wt 195 lb (88.5 kg)  SpO2 93% Comment: ON RA  BMI 31.00 kg/m  General: Elderly but well-appearing  HEENT: Unremarkable, NCAT, PERLA, EOMI, oropharynx clear, teeth in fair condition  Neck: no JVD, no bruits, no adenopathy or thyromegaly  Chest: clear to auscultation, symmetrical breath sounds, no wheezes, no rhonchi  CV: RRR, grade III/VI crescendo/decrescendo murmur heard best at RSB, no diastolic murmur  Abdomen: soft, non-tender, no masses or organomegaly  Extremities: warm, well-perfused, pulses palpable in feet, mild LE edema  Rectal/GU Deferred  Neuro: Grossly non-focal and symmetrical throughout  Skin: Clean and dry, no rashes, no breakdown  Diagnostic Tests:  [facility]  [address], [place] [address]  [phone]  -------------------------------------------------------------------  Transthoracic Echocardiography  Patient: Olyn, Landstrom  MR #: [id number]  Study Date: 03/29/2017  Gender: M  Age: 85  Height: 167.6 cm  Weight: 89.4 kg  BSA: 2.07 m^2  Pt. Status:  Room:  SONOGRAPHER Cindy Hazy, RDCS  PERFORMING [facility],  Outpatient  ATTENDING Loney Laurence  REFERRING Angelena Form R  cc:  -------------------------------------------------------------------  LV EF: 25% - 30%  -------------------------------------------------------------------  Indications: O87.86 Chronic Systolic Heart Failure. ECHO WITH  DEFINITY.  -------------------------------------------------------------------  History: PMH: Acquired from the patient and from the patient&'s  chart. PMH: CAD. Myocardial Infarction. Risk factors:  Hypertension. Diabetes mellitus. Dyslipidemia.  -------------------------------------------------------------------  Study Conclusions  - Left ventricle: The cavity size was mildly dilated. Systolic  function was severely reduced. The estimated ejection fraction  was in the range of 25% to 30%. Severe diffuse hypokinesis with  distinct regional wall motion abnormalities. Severe hypokinesis  of the basal-mid anteroseptal and anterior myocardium; consistent  with ischemia in the distribution of the native left anterior  descending coronary artery, with a patent distal bypass graft.  Hypokinesis of the inferolateral myocardium. Doppler parameters  are consistent with abnormal left ventricular relaxation (grade 1  diastolic dysfunction).  - Aortic valve: Valve mobility was severely restricted. There was  at least moderate stenosis. The ejection jet is mid peaking.  There was trivial regurgitation.  - Mitral valve: There was mild regurgitation.  - Left atrium: The atrium was moderately dilated.  - Right ventricle: Systolic function was mildly reduced.  Impressions:  - Possible low gradient severe aortic stenosis. If the patient is a  candidate for TAVR/SAVR, conisder dobutamine echo for further  assessment.  -------------------------------------------------------------------  Study data: Study status: Routine. Procedure: The patient  reported no pain pre or post  test. Transthoracic echocardiography  for left ventricular function evaluation, for right ventricular  function evaluation, and for assessment of valvular function. Image  quality was adequate. Intravenous contrast (Definity) was  administered to enhance regional wall motion assessment and opacify  the LV. Study completion: There were no complications.  Transthoracic echocardiography. M-mode, complete 2D, spectral  Doppler, and color Doppler. Birthdate: Patient birthdate:  [birth year]. Age: Patient is 83 yr old. Sex: Gender: male.  BMI: 31.8 kg/m^2. Blood pressure: 130/64 Patient status:  Outpatient. Study date: Study date: 03/29/2017. Study time: 10:50  AM. Location: [place] [facility]  -------------------------------------------------------------------  -------------------------------------------------------------------  Left ventricle: The cavity size was mildly dilated. Systolic  function was severely reduced. The estimated ejection fraction was  in the range of 25% to 30%. Severe diffuse hypokinesis with  distinct regional wall motion abnormalities. Regional wall motion  abnormalities: Severe hypokinesis of the basal-mid anteroseptal  and anterior myocardium; consistent with ischemia in the  distribution of the native left anterior descending coronary  artery, with a patent distal bypass graft. Hypokinesis of the  inferolateral myocardium. The pulmonary vein flow pattern was  normal. Doppler parameters are consistent with abnormal left  ventricular relaxation (grade 1 diastolic dysfunction). There was  no evidence of elevated ventricular filling pressure by Doppler  parameters.  -------------------------------------------------------------------  Aortic valve: Trileaflet; moderately thickened, moderately  calcified leaflets. Valve mobility was severely restricted.  Doppler: There was at least moderate stenosis. The ejection jet is  mid peaking. There was trivial regurgitation. VTI  ratio of LVOT  to aortic valve: 0.18. Valve area (VTI): 0.79 cm^2. Indexed valve  area (  VTI): 0.38 cm^2/m^2. Peak velocity ratio of LVOT to aortic  valve: 0.16. Valve area (Vmax): 0.7 cm^2. Indexed valve area  (Vmax): 0.34 cm^2/m^2. Mean velocity ratio of LVOT to aortic valve:  0.18. Valve area (Vmean): 0.78 cm^2. Indexed valve area (Vmean):  0.38 cm^2/m^2. Mean gradient (S): 26 mm Hg. Peak gradient (S):  42 mm Hg.  -------------------------------------------------------------------  Aorta: Aortic root: The aortic root was normal in size.  Ascending aorta: The ascending aorta was normal in size.  -------------------------------------------------------------------  Mitral valve: Structurally normal valve. Leaflet separation was  normal. Doppler: Transvalvular velocity was within the normal  range. There was no evidence for stenosis. There was mild  regurgitation. Peak gradient (D): 1 mm Hg.  -------------------------------------------------------------------  Left atrium: The atrium was moderately dilated.  -------------------------------------------------------------------  Right ventricle: The cavity size was normal. Systolic function was  mildly reduced.  -------------------------------------------------------------------  Pulmonic valve: Structurally normal valve. Cusp separation was  normal. Doppler: Transvalvular velocity was within the normal  range. There was no regurgitation.  -------------------------------------------------------------------  Tricuspid valve: Structurally normal valve. Leaflet separation  was normal. Doppler: Transvalvular velocity was within the normal  range. There was no regurgitation.  -------------------------------------------------------------------  Pulmonary artery: Systolic pressure could not be accurately  estimated.  -------------------------------------------------------------------  Right atrium: The atrium was normal in size.    -------------------------------------------------------------------  Pericardium: There was no pericardial effusion.  -------------------------------------------------------------------  Systemic veins:  Inferior vena cava: The vessel was normal in size. The  respirophasic diameter changes were in the normal range (= 50%),  consistent with normal central venous pressure.  -------------------------------------------------------------------  Measurements  Left ventricle Value Reference  LV ID, ED, PLAX chordal (H) 58.43 mm 43 - 52  LV ID, ES, PLAX chordal (H) 51.14 mm 23 - 38  LV fx shortening, PLAX chordal (L) 12 % >=29  LV PW thickness, ED 11.6 mm ---------  IVS/LV PW ratio, ED 1.06 <=1.3  Stroke volume, 2D 55 ml ---------  Stroke volume/bsa, 2D 27 ml/m^2 ---------  LV e&', lateral 4.23 cm/s ---------  LV E/e&', lateral 11.31 ---------  LV e&', medial 5.05 cm/s ---------  LV E/e&', medial 9.47 ---------  LV e&', average 4.64 cm/s ---------  LV E/e&', average 10.31 ---------  Ventricular septum Value Reference  IVS thickness, ED 12.3 mm ---------  LVOT Value Reference  LVOT ID, S 23.6 mm ---------  LVOT area 4.37 cm^2 ---------  LVOT ID 22 mm ---------  LVOT peak velocity, S 53.3 cm/s ---------  LVOT mean velocity, S 44.2 cm/s ---------  LVOT VTI, S 13.01 cm ---------  Stroke volume (SV), LVOT DP 56.9 ml ---------  Stroke index (SV/bsa), LVOT DP 27.5 ml/m^2 ---------  Aortic valve Value Reference  Aortic valve peak velocity, S 324 cm/s ---------  Aortic valve mean velocity, S 247 cm/s ---------  Aortic valve VTI, S 74 cm ---------  Aortic mean gradient, S 26 mm Hg ---------  Aortic peak gradient, S 42 mm Hg ---------  VTI ratio, LVOT/AV 0.18 ---------  Aortic valve area, VTI 0.79 cm^2 ---------  Aortic valve area/bsa, VTI 0.38 cm^2/m^2 ---------  Velocity ratio, peak, LVOT/AV 0.16 ---------  Aortic valve area, peak velocity 0.7 cm^2 ---------  Aortic valve area/bsa, peak  0.34 cm^2/m^2 ---------  velocity  Velocity ratio, mean, LVOT/AV 0.18 ---------  Aortic valve area, mean velocity 0.78 cm^2 ---------  Aortic valve area/bsa, mean 0.38 cm^2/m^2 ---------  velocity  Aortic regurg pressure half-time 473 ms ---------  Aorta Value Reference  Aortic root ID, ED 37 mm ---------  Ascending aorta ID, A-P, S 35 mm ---------  Left atrium Value Reference  LA ID, A-P, ES 47 mm ---------  LA ID/bsa, A-P (H) 2.27 cm/m^2 <=2.2  LA volume, S 61 ml ---------  LA volume/bsa, S 29.5 ml/m^2 ---------  LA volume, ES, 1-p A4C 58 ml ---------  LA volume/bsa, ES, 1-p A4C 28 ml/m^2 ---------  LA volume, ES, 1-p A2C 64 ml ---------  LA volume/bsa, ES, 1-p A2C 30.9 ml/m^2 ---------  Mitral valve Value Reference  Mitral E-wave peak velocity 47.84 cm/s ---------  Mitral A-wave peak velocity 87 cm/s ---------  Mitral deceleration time (H) 310 ms 150 - 230  Mitral peak gradient, D 1 mm Hg ---------  Mitral E/A ratio, peak 0.55 ---------  Right ventricle Value Reference  RV s&', lateral, S 10.7 cm/s ---------  Legend:  (L) and (H) mark values outside specified reference range.  -------------------------------------------------------------------  Prepared and Electronically Authenticated by  Sanda Klein, MD  2018-04-09T14:41:52  Physicians  Panel Physicians Referring Physician Case Authorizing Physician  Belva Crome, MD (Primary)    Procedures  Right/Left Heart Cath and Coronary/Graft Angiography  Conclusion  Severe native vessel coronary disease with total occlusion of the left main and native right coronary.  Bypass graft failure with occlusion of the saphenous vein graft to the ramus intermedius.  Widely patent saphenous vein graft to the PDA and PL branch.  Widely patent LIMA to the LAD  Normal pulmonary artery pressures including capillary wedge.  Indications  Acute systolic heart failure (HCC) [I50.21 (ICD-10-CM)]  Coronary artery disease involving  autologous vein coronary bypass graft without angina pectoris [I25.810 (ICD-10-CM)]  Procedural Details/Technique  Technical Details The left radial area was sterilely prepped and draped. Intravenous sedation with Versed and fentanyl was administered. 1% Xylocaine was infiltrated to achieve local analgesia. A double wall stick with an angiocath was utilized to obtain intra-arterial access. The modified Seldinger technique was used to place a 10F " Slender" sheath in the left radial artery. Weight based heparin was administered. Coronary angiography was done using 5 F catheters. Right coronary angiography was performed with a JR4. Left ventricular hemodymic recordings were not possible. We used multiple catheters in an attempt to cross the aortic valve but was unsuccessful. There was inability to free torque control due to the angulation from the subclavian into the aorta. Left coronary angiography was performed with a JL 3.5 cm. Left ventriculography was not attempted due to CKD.  Right heart catheterization was performed by exchanging an antecubital angiocath IV for a 5 French brachial sheath. 1% Xylocaine local infiltration at the IV site was given prior to sheath exchange. A double glove technique was used. The modified Seldinger technique was employed. After sheath insertion, right heart cath was performed using a 5 French balloon tipped catheter. Pressures were recorded in each chamber. A main pulmonary artery O2 saturation was obtained. I wedge pressure was also measured.   Hemostasis was achieved using a pneumatic band.  During this procedure the patient is administered a total of Versed 2 mg and Fentanyl 50 mg to achieve and maintain moderate conscious sedation. The patient's heart rate, blood pressure, and oxygen saturation are monitored continuously during the procedure. The period of conscious sedation is 46 minutes, of which I was present face-to-face 100% of this time.   Estimated blood loss  <50 mL.  During this procedure the patient was administered the following to achieve and maintain moderate conscious sedation: Versed 2 mg, Fentanyl 50 mcg, while the patient's heart  rate, blood pressure, and oxygen saturation were continuously monitored. The period of conscious sedation was 46 minutes, of which I was present face-to-face 100% of this time.  Coronary Findings  Dominance: Right  Left Main  LM lesion, 100% stenosed.  Left Anterior Descending  First Diagonal Branch  Ost 1st Diag lesion, 100% stenosed.  Second Diagonal Branch  Vessel is small in size.  Third Diagonal Branch  Vessel is small in size.  Right Coronary Artery  Prox RCA lesion, 100% stenosed.  Mid RCA to Dist RCA lesion, 100% stenosed.  Right Posterior Descending Artery  Vessel is small in size.  First Right Posterolateral  Vessel is small in size.  Graft Angiography  Free LIMA Graft to Dist LAD  LIMA.  Graft to Post Atrio  and is very large.  saphenous Graft to Ramus  SVG.  Prox Graft lesion, 100% stenosed.  Right Heart  Right Heart Pressures LV EDP is normal. Right heart pressures were normal.  Left Heart  Left Ventricle LVEF known to be 25% by echocardiography. Contrast ventriculography was not performed due to inability to cross aortic valve from the left arm approach and chronic kidney disease.  Coronary Diagrams  Diagnostic Diagram     Implants     No implant documentation for this case.  PACS Images  Link to Procedure Log   Show images for Cardiac catheterization Procedure Log  Hemo Data   Most Recent Value  Fick Cardiac Output 5.33 L/min  Fick Cardiac Output Index 2.72 (L/min)/BSA  RA A Wave 7 mmHg  RA V Wave 7 mmHg  RA Mean 6 mmHg  RV Systolic Pressure 25 mmHg  RV Diastolic Pressure 4 mmHg  RV EDP 9 mmHg  PA Systolic Pressure 21 mmHg  PA Diastolic Pressure 4 mmHg  PA Mean 12 mmHg  PW A Wave 10 mmHg  PW V Wave 11 mmHg  PW Mean 6 mmHg  AO Systolic Pressure 759 mmHg  AO  Diastolic Pressure 56 mmHg  AO Mean 80 mmHg  QP/QS 1  TPVR Index 5.51 HRUI  TSVR Index 29.41 HRUI  PVR SVR Ratio 0.09  TPVR/TSVR Ratio 0.19  *[place] [facility]*  [address], [place] [address]  [phone]  -------------------------------------------------------------------  Stress Echocardiography  Patient: Izac, Faulkenberry  MR #: [id number]  Study Date: 04/26/2017  Gender: M  Age: 43  Height: 167.6 cm  Weight: 89.4 kg  BSA: 2.07 m^2  Pt. Status:  Room:  ATTENDING Belva Crome, MD  Livia Snellen Belva Crome, MD  [place], MD  SONOGRAPHER Cindy Hazy, RDCS  PERFORMING [facility], Outpatient  cc:  -------------------------------------------------------------------  LV EF: 20% - 25%  -------------------------------------------------------------------  Indications: I35.9 Aortic Valve Disorder. ASSESS FOR LOW  GRADIENT AORTIC STENOSIS.  -------------------------------------------------------------------  History: PMH: CAD. Myocardial Infarction. Risk factors:  Hypertension. Diabetes mellitus. Dyslipidemia.  -------------------------------------------------------------------  Study Conclusions  - Left ventricle: The cavity size was mildly dilated. There was  mild concentric hypertrophy. Systolic function was severely  reduced. The estimated ejection fraction was in the range of 20%  to 25%. Diffuse hypokinesis.  - Aortic valve: Valve mobility was restricted. There was severe  stenosis. There was trivial regurgitation.  - Left atrium: The atrium was mildly dilated.  - Stress ECG conclusions: The stress ECG was normal.  Impressions:  - LV function severely depressed at baseline which improved with  dobutamine; mean gradient 25 mmHg at baseline and increased to 41  mmHg with dobutamine; AVA  0.8 cm 2 at baseline and with peak  infusion; dimensionless index 0.2 at baseline and peak infusion;  findings are consistent with severe AS.    -------------------------------------------------------------------  Study data: Study status: Routine. Consent: The risks,  benefits, and alternatives to the procedure were explained to the  patient and informed consent was obtained. Procedure: The patient  reported no pain pre or post test. Initial setup. The patient was  brought to the laboratory. A baseline ECG was recorded. Surface ECG  leads and automatic cuff blood pressure measurements were  monitored. Dobutamine stress test. Stress testing was performed,  with dobutamine infusion from 5 to 20 mcg/kg/min by 5 mcg/kg/min  increments. The infusion was terminated due to maximal dose  administration. Transthoracic stress echocardiography for left  ventricular function evaluation and assessment of valvular  function. Images were captured at baseline, low dose, peak dose,  and recovery. Study completion: The patient tolerated the  procedure well. There were no complications. Dobutamine.  Stress echocardiography. 2D. Birthdate: Patient birthdate:  1934/01/05. Age: Patient is 83 yr old. Sex: Gender: male.  BMI: 31.8 kg/m^2. Blood pressure: 112/66 Patient status:  Outpatient. Study date: Study date: 04/26/2017. Study time: 02:24  PM.  -------------------------------------------------------------------  -------------------------------------------------------------------  Left ventricle: The cavity size was mildly dilated. There was mild  concentric hypertrophy. Systolic function was severely reduced. The  estimated ejection fraction was in the range of 20% to 25%. Diffuse  hypokinesis.  -------------------------------------------------------------------  Aortic valve: Trileaflet; severely calcified leaflets. Valve  mobility was restricted. Doppler: There was severe stenosis.  There was trivial regurgitation. VTI ratio of LVOT to aortic  valve: 0.26. Valve area (VTI): 0.99 cm^2. Indexed valve area (VTI):  0.48 cm^2/m^2. Peak velocity  ratio of LVOT to aortic valve: 0.22.  Valve area (Vmax): 0.85 cm^2. Indexed valve area (Vmax): 0.41  cm^2/m^2. Mean velocity ratio of LVOT to aortic valve: 0.22. Valve  area (Vmean): 0.85 cm^2. Indexed valve area (Vmean): 0.41 cm^2/m^2.  Mean gradient (S): 28 mm Hg. Peak gradient (S): 51 mm Hg.  -------------------------------------------------------------------  Left atrium: The atrium was mildly dilated.  -------------------------------------------------------------------  Right ventricle: The cavity size was normal. Systolic function was  normal.  -------------------------------------------------------------------  Right atrium: The atrium was normal in size.  -------------------------------------------------------------------  Baseline ECG: Normal sinus rhythm, septal MI, nonspecific ST  changes.  -------------------------------------------------------------------  Stress protocol:  +-----------------------+---+------------+--------+  !Stage !HR !BP (mmHg) !Symptoms!  +-----------------------+---+------------+--------+  !Baseline !85 !112/66 (81) !None !  +-----------------------+---+------------+--------+  !Dobutamine 5 ug/kg/min !82 !137/70 (92) !None !  +-----------------------+---+------------+--------+  !Dobutamine 10 ug/kg/min!82 !141/69 (93) !None !  +-----------------------+---+------------+--------+  !Dobutamine 20 ug/kg/min!89 !152/69 (97) !None !  +-----------------------+---+------------+--------+  !Immediate post stress !80 !------------!None !  +-----------------------+---+------------+--------+  !Recovery; 1 min !80 !159/70 (100)!None !  +-----------------------+---+------------+--------+  !Recovery; 2 min !75 !------------!None !  +-----------------------+---+------------+--------+  !Recovery; 3 min !90 !158/72 (101)!None !  +-----------------------+---+------------+--------+  !Recovery; 4 min !100!------------!None !    +-----------------------+---+------------+--------+  !Recovery; 5 min !96 !113/70 (84) !None !  +-----------------------+---+------------+--------+  -------------------------------------------------------------------  Stress results: Maximal heart rate during stress was 100 bpm (71%  of maximal predicted heart rate). The maximal predicted heart rate  was 140 bpm.The target heart rate was achieved. The heart rate  response to stress was normal. There was a normal resting blood  pressure. Normal blood pressure response to dobutamine. The  rate-pressure product for the peak heart rate and blood pressure  was 14220 mm Hg/min. The patient experienced no chest pain during  stress.  -------------------------------------------------------------------  Stress ECG:  The stress ECG was normal.  -------------------------------------------------------------------  Baseline:  - LV size was mildly enlarged.  - LV global systolic function was severely depressed. The estimated  LV ejection fraction was 20%.  - There was diffuse LV hypokinesis. LVOT VTI 60 cm/s; AV VTI 305  cm/s; Mean gradient 25 mmHg; AVA 0.8 cm2; DI 0.2.  Low dose: 10 micrograms/Kg/min-LVOT VTI 60 cm/s; AV VTI 360 cm/s;  mean gradient 37 mmHg; AVA 0.63 cm2; DI 0.17 cm2.  Peak stress:  - LVOT VTI 76 cm/2; AV VTI 384 cm2; mean gradient 41 mmHg: AVA 0.8  cm2; DI 0.2.  - LV global systolic function was mildly to moderately depressed.  The estimated LV ejection fraction was 40-45%.  Recovery:  -------------------------------------------------------------------  Measurements  Left ventricle Value Reference  LV ID, ED, PLAX chordal (H) 55.7 mm 43 - 52  LV ID, ES, PLAX chordal (H) 44.8 mm 23 - 38  LV fx shortening, PLAX chordal (L) 20 % >=29  LV PW thickness, ED 11.9 mm ---------  IVS/LV PW ratio, ED 1.02 <=1.3  Stroke volume, 2D 50 ml ---------  Stroke volume/bsa, 2D 24 ml/m^2 ---------  Ventricular septum Value Reference  IVS  thickness, ED 12.1 mm ---------  LVOT Value Reference  LVOT ID, S 22 mm ---------  LVOT area 3.8 cm^2 ---------  LVOT peak velocity, S 79.8 cm/s ---------  LVOT mean velocity, S 56.3 cm/s ---------  LVOT VTI, S 13.1 cm ---------  LVOT peak gradient, S 3 mm Hg ---------  Aortic valve Value Reference  Aortic valve peak velocity, S 358 cm/s ---------  Aortic valve mean velocity, S 252 cm/s ---------  Aortic valve VTI, S 50.5 cm ---------  Aortic mean gradient, S 28 mm Hg ---------  Aortic peak gradient, S 51 mm Hg ---------  VTI ratio, LVOT/AV 0.26 ---------  Aortic valve area, VTI 0.99 cm^2 ---------  Aortic valve area/bsa, VTI 0.48 cm^2/m^2 ---------  Velocity ratio, peak, LVOT/AV 0.22 ---------  Aortic valve area, peak velocity 0.85 cm^2 ---------  Aortic valve area/bsa, peak 0.41 cm^2/m^2 ---------  velocity  Velocity ratio, mean, LVOT/AV 0.22 ---------  Aortic valve area, mean velocity 0.85 cm^2 ---------  Aortic valve area/bsa, mean 0.41 cm^2/m^2 ---------  velocity  Aorta Value Reference  Aortic root ID, ED 35 mm ---------  Left atrium Value Reference  LA ID, A-P, ES 46 mm ---------  LA ID/bsa, A-P (H) 2.22 cm/m^2 <=2.2  Legend:  (L) and (H) mark values outside specified reference range.  -------------------------------------------------------------------  Prepared and Electronically Authenticated by  Kirk Ruths  2018-05-07T16:25:31  CT CORONARY MORPH W/CTA COR W/SCORE W/CA W/CM &/OR WO/CM (Accession [id number]) (Order [id number])  Imaging  Date: 06/11/2017 Department: [facility] CT IMAGING Released By: Michael Boston Authorizing: Sherren Mocha, MD  Exam Information  Status Exam Begun  Exam Ended   Final [99] 06/11/2017 11:06 AM 06/11/2017 12:49 PM  PACS Images  Show images for CT CORONARY MORPH W/CTA COR W/SCORE W/CA W/CM &/OR WO/CM  Addendum   ADDENDUM REPORT: 06/11/2017 16:08  CLINICAL DATA: 83 year old male with severe aortic stenosis being    evaluated for TAVR.  EXAM:  Cardiac TAVR CT  TECHNIQUE:  The patient was scanned on a Philips 256 scanner. A 120 kV  retrospective scan was triggered in the descending thoracic aorta at  111 HU's. Gantry rotation speed was 270 msecs and collimation was .9  mm. 10 mg of iv Metoprolol and no nitro were given. The 3D data set  was reconstructed  in 5% intervals of the R-R cycle. Systolic and  diastolic phases were analyzed on a dedicated work station using  MPR, MIP and VRT modes. The patient received 80 cc of contrast.  FINDINGS:  Aortic Valve: Trileaflet, moderately thickened and calcified aortic  valve with severely restricted leaflet opening. No calcifications  extending into the LVOT. LVOT is large.  Aorta: Normal size, trivial calcifications. No dissection.  Sinotubular Junction: 30 x 28 mm  Ascending Thoracic Aorta: 37 x 36 mm  Aortic Arch: Not visualized  Descending Thoracic Aorta: 26 x 23 mm  Sinus of Valsalva Measurements:  Non-coronary: 34 mm  Right -coronary: 29 mm  Left -coronary: 34 mm  Coronary Artery Height above Annulus:  Left Main: 14 mm  Right Coronary: 13 mm  Virtual Basal Annulus Measurements:  Maximum/Minimum Diameter: 28 x 24 mm  Perimeter: 84 mm  Area: 535 mm2  Optimum Fluoroscopic Angle for Delivery: LAO 0 CAU 0  IMPRESSION:  1. Trileaflet, moderately thickened and calcified aortic valve with  severely restricted leaflet opening. No calcifications extending  into the LVOT. LVOT is large and makes annular sizing challenging.  Annular measurements suitable for a delivery of a 26 mm  Edwards-SAPIEN 3 valve.  2. Sufficient annulus to coronary distance.  3. Optimum Fluoroscopic Angle for Delivery: LAO 0 CAU 0  4. No thrombus in the left atrial appendage.  Ena Dawley  Electronically Signed  By: Ena Dawley  On: 06/11/2017 16:08   Addended by Dorothy Spark, MD on 06/11/2017 4:10 PM  Study Result   EXAM:  OVER-READ INTERPRETATION CT CHEST   The following report is an over-read performed by radiologist Dr.  Collene Leyden [facility] [place] on 06/11/2017. This over-read  does not include interpretation of cardiac or coronary anatomy or  pathology. The coronary CTA interpretation by the cardiologist is  attached.  COMPARISON: 06/03/2017  FINDINGS:  Cardiovascular: Heart is normal size. Extensive calcifications  throughout the aorta which is normal caliber. No dissection.  Mediastinum/Nodes: No adenopathy in the visualized lower mediastinum  or hila. Calcified left hilar lymph nodes.  Lungs/Pleura: Visualized lungs are clear. No effusions.  Upper Abdomen: Imaging into the upper abdomen shows no acute  findings.  Musculoskeletal: No acute bony abnormality. Chest wall soft tissues  unremarkable. Prior median sternotomy and CABG.  IMPRESSION:  No acute or significant extracardiac abnormality.  Electronically Signed:  By: Rolm Baptise M.D.  On: 06/11/2017 14:41   CT Angio Abd/Pel w/ and/or w/o (Accession [id number]) (Order [id number])  Imaging  Date: 06/03/2017 Department: [facility] CT IMAGING Released By: Jillyn Hidden Authorizing: Sherren Mocha, MD  Exam Information  Status Exam Begun  Exam Ended   Final [99] 06/03/2017 1:53 PM 06/03/2017 2:17 PM  Study Result  CLINICAL DATA: 83 year old male with history of severe aortic  stenosis. Preprocedural study prior to potential transcatheter  aortic valve replacement (TAVR) procedure.  EXAM:  CT ANGIOGRAPHY CHEST, ABDOMEN AND PELVIS  TECHNIQUE:  Multidetector CT imaging through the chest, abdomen and pelvis was  performed using the standard protocol during bolus administration of  intravenous contrast. Multiplanar reconstructed images and MIPs were  obtained and reviewed to evaluate the vascular anatomy.  CONTRAST: 80 mL of Isovue 370.  COMPARISON: CT the abdomen and pelvis 11/10/2016.  FINDINGS:  CTA CHEST FINDINGS  Cardiovascular: Heart size is  normal. There is no significant  pericardial fluid, thickening or pericardial calcification. There is  aortic atherosclerosis, as well as atherosclerosis of the great  vessels  of the mediastinum and the coronary arteries, including  calcified atherosclerotic plaque in the left main, left anterior  descending, left circumflex and right coronary arteries. Status post  median sternotomy for CABG including LIMA to the LAD. Severe  thickening calcification of the aortic valve. Calcification of the  mitral valve and mitral annulus.  Mediastinum/Lymph Nodes: No pathologically enlarged mediastinal or  hilar lymph nodes. Esophagus is unremarkable in appearance. No  axillary lymphadenopathy.  Lungs/Pleura: 6 mm subpleural nodule in the posterior aspect of the  right upper lobe (axial image 65 of series 6). No acute  consolidative airspace disease. No pleural effusions.  Musculoskeletal/Soft Tissues: Median sternotomy wires. There are no  aggressive appearing lytic or blastic lesions noted in the  visualized portions of the skeleton.  CTA ABDOMEN AND PELVIS FINDINGS  Hepatobiliary: No cystic or solid hepatic lesions. No intra or  extrahepatic biliary ductal dilatation. Gallbladder is normal in  appearance.  Pancreas: No pancreatic mass. No pancreatic ductal dilatation. No  pancreatic or peripancreatic fluid or inflammatory changes.  Spleen: Small calcified granulomas in the spleen.  Adrenals/Urinary Tract: Small nonobstructive calculi are present  within the collecting systems of the kidneys bilaterally, largest of  which measures 3 mm in the interpolar region of the right kidney.  Mild atrophy and multifocal cortical thinning in the kidneys  bilaterally. Multiple subcentimeter low-attenuation lesions in the  kidneys bilaterally, too small to characterize, but favored to  represent cysts. In addition, there is an exophytic low-attenuation  lesion in the lower pole of the right kidney, compatible  with a  simple cyst. No hydroureteronephrosis. Urinary bladder is normal in  appearance. Bilateral adrenal glands are normal in appearance.  Stomach/Bowel: The appearance of the stomach is normal. There is no  pathologic dilatation of small bowel or colon. Numerous colonic  diverticulae are noted, without surrounding inflammatory changes to  suggest an acute diverticulitis at this time. The appendix is not  confidently identified and may be surgically absent. Regardless,  there are no inflammatory changes noted adjacent to the cecum to  suggest the presence of an acute appendicitis at this time.  Vascular/Lymphatic: Extensive aortic atherosclerosis throughout the  abdominal and pelvic vasculature, with vascular findings and  measurements pertinent to potential TAVR procedure, as detailed  below. No aneurysm or dissection noted in the abdomen or pelvis.  Celiac axis, superior mesenteric artery and inferior mesenteric  artery are all widely patent without hemodynamically significant  stenosis. Bilateral renal arteries are widely patent. No  lymphadenopathy noted in the abdomen or pelvis.  Reproductive: Numerous brachytherapy implants are noted throughout  the prostate gland. Seminal vesicles are unremarkable in appearance.  Other: 2 tiny supraumbilical ventral hernias containing only omental  fat. No significant volume of ascites. No pneumoperitoneum.  Musculoskeletal: There are no aggressive appearing lytic or blastic  lesions noted in the visualized portions of the skeleton.  VASCULAR MEASUREMENTS PERTINENT TO TAVR:  AORTA:  Minimal Aortic Diameter - 13 x 13 mm  Severity of Aortic Calcification - severe  RIGHT PELVIS:  Right Common Iliac Artery -  Minimal Diameter - 3.9 x 3.7 mm  Tortuosity - mild  Calcification - severe  Right External Iliac Artery -  Minimal Diameter - 7.1 x 6.9 mm  Tortuosity - mild  Calcification - mild to moderate  Right Common Femoral Artery -  Minimal  Diameter - 8.8 x 5.8 mm  Tortuosity - mild  Calcification - moderate  LEFT PELVIS:  Left Common Iliac Artery -  Minimal  Diameter - 8.1 x 8.0 mm  Tortuosity - moderate  Calcification - moderate  Left External Iliac Artery -  Minimal Diameter - 7.8 x 7.7 mm  Tortuosity - moderate  Calcification - mild to moderate  Left Common Femoral Artery -  Minimal Diameter - 9.0 x 8.1 mm  Tortuosity - mild  Calcification - moderate  Review of the MIP images confirms the above findings.  IMPRESSION:  1. Vascular findings and measurements pertinent to potential TAVR  procedure, as detailed above. The patient has suitable pelvic  arterial access on the left side.  2. Severe thickening calcification of the aortic valve, compatible  with the reported clinical history of severe aortic stenosis.  3. Colonic diverticulosis without evidence of acute diverticulitis  at this time.  4. 6 mm subpleural nodule in the posterior aspect of the right upper  lobe. Non-contrast chest CT at 6-12 months is recommended. If the  nodule is stable at time of repeat CT, then future CT at 18-24  months (from today's scan) is considered optional for low-risk  patients, but is recommended for high-risk patients. This  recommendation follows the consensus statement: Guidelines for  Management of Incidental Pulmonary Nodules Detected on CT Images:  From the Fleischner Society 2017; Radiology 2017; 284:228-243.  5. Additional incidental findings, as above.  Electronically Signed  By: Vinnie Langton M.D.  On: 06/03/2017 15:48  RISK SCORES\pardAbout the STS Risk Calculator\pardProcedure: AV Replacement\cb3 Risk of Mortality: 10.772%\cb3 Morbidity or Mortality: 42.603%\cb3 Long Length of Stay: 21.098%\cb3 Short Length of Stay: 11.981%\cb3 Permanent Stroke: 2.805%\cb3 Prolonged Ventilation: 31.324%\cb3 DSW Infection: 0.9%\cb3 Renal Failure: 22.671%\cb3 Reoperation: 12.531%    Impression:   This 83 year old gentleman has  stage D severe, symptomatic aortic stenosis and severe LV dysfunction with NYHA class III symptoms of exertional shortness of breath and fatigue with mild exertion. I have personally reviewed and interpreted his echo, cath and CT studies. He has a trileaflet aortic valve with moderately calcified and thickened leaflets with severe restriction of leaflet mobility. His LVEF is 25-30%. His mean gradient went from 25 mm Hg to 41 mm Hg with peak stress. He has a patent vein graft to his RCA territory and a patent LIMA to the LAD. I agree that AVR is indicated in this gentleman. He would be at high risk for redo sternotomy for open surgical AVR due to his age, redo status, severe LV systolic dysfunction, chronic kidney disease, reduced mobility and other comorbid medical conditions and I think TAVR would be a much better option for him. His cardiac CT shows anatomy favorable for TAVR using a Sapien 3 valve and his abdominal and pelvic CT shows anatomy suitable for transfemoral insertion.  The patient and his wife were counseled at length regarding treatment alternatives for management of severe symptomatic aortic stenosis. The risks and benefits of surgical intervention has been discussed in detail. Long-term prognosis with medical therapy was discussed. Alternative approaches such as conventional surgical aortic valve replacement, transcatheter aortic valve replacement, and palliative medical therapy were compared and contrasted at length. This discussion was placed in the context of the patient's own specific clinical presentation and past medical history. All of their questions been addressed. The patient is eager to proceed with surgical management as soon as possible.  Following the decision to proceed with transcatheter aortic valve replacement, a discussion was held regarding what types of management strategies would be attempted intraoperatively in the event of life-threatening complications, including whether  or not the patient would be  considered a candidate for the use of cardiopulmonary bypass and/or conversion to open sternotomy for attempted surgical intervention.  The patient has been advised of a variety of complications that might develop including but not limited to risks of death, stroke, paravalvular leak, aortic dissection or other major vascular complications, aortic annulus rupture, device embolization, cardiac rupture or perforation, mitral regurgitation, acute myocardial infarction, arrhythmia, heart block or bradycardia requiring permanent pacemaker placement, congestive heart failure, respiratory failure, renal failure, pneumonia, infection, other late complications related to structural valve deterioration or migration, or other complications that might ultimately cause a temporary or permanent loss of functional independence or other long term morbidity. The patient provides full informed consent for the procedure as described and all questions were answered.   Plan:   Transfemoral TAVR on Tuesday 06/22/2017. Gaye Pollack, MD

## 2017-06-22 NOTE — Interval H&P Note (Signed)
History and Physical Interval Note:  06/22/2017 6:41 AM  [name]  has presented today for surgery, with the diagnosis of SEVERE AS  The various methods of treatment have been discussed with the patient and family. After consideration of risks, benefits and other options for treatment, the patient has consented to  Procedure(s): TRANSCATHETER AORTIC VALVE REPLACEMENT, TRANSFEMORAL (N/A) TRANSESOPHAGEAL ECHOCARDIOGRAM (TEE) (N/A) as a surgical intervention .  The patient's history has been reviewed, patient examined, no change in status, stable for surgery.  I have reviewed the patient's chart and labs.  Questions were answered to the patient's satisfaction.     Gaye Pollack

## 2017-06-22 NOTE — Transfer of Care (Signed)
Immediate Anesthesia Transfer of Care Note  Patient: [name]  Procedure(s) Performed: Procedure(s): TRANSCATHETER AORTIC VALVE REPLACEMENT, TRANSFEMORAL (N/A) TRANSESOPHAGEAL ECHOCARDIOGRAM (TEE) (N/A)  Patient Location: ICU  Anesthesia Type:General  Level of Consciousness: awake, alert  and oriented  Airway & Oxygen Therapy: Patient Spontanous Breathing and Patient connected to face mask oxygen  Post-op Assessment: Report given to RN  Post vital signs: Reviewed and stable  Last Vitals:  Vitals:   06/22/17 0609 06/22/17 0715  BP: (!) 108/56   Pulse: 73 73  Resp: 18 17  Temp: 36.7 C     Last Pain:  Vitals:   06/22/17 0609  TempSrc: Oral         Complications: No apparent anesthesia complications

## 2017-06-22 NOTE — Anesthesia Preprocedure Evaluation (Addendum)
Anesthesia Evaluation  Patient identified by MRN, date of birth, ID band Patient awake    Reviewed: Allergy & Precautions, NPO status , Patient's Chart, lab work & pertinent test results  Airway Mallampati: II   Neck ROM: Full    Dental no notable dental hx. (+) Partial Upper, Partial Lower, Dental Advisory Given   Pulmonary shortness of breath, former smoker,    breath sounds clear to auscultation       Cardiovascular hypertension, + CAD, + Past MI and + Peripheral Vascular Disease  + Valvular Problems/Murmurs AS  Rhythm:Regular Rate:Normal + Systolic murmurs    Neuro/Psych    GI/Hepatic GERD  ,  Endo/Other  diabetes  Renal/GU      Musculoskeletal  (+) Arthritis ,   Abdominal   Peds  Hematology   Anesthesia Other Findings   Reproductive/Obstetrics                           Anesthesia Physical Anesthesia Plan  ASA: IV  Anesthesia Plan: General   Post-op Pain Management:    Induction:   PONV Risk Score and Plan: 3 and Ondansetron, Dexamethasone, Propofol and Midazolam  Airway Management Planned: Oral ETT  Additional Equipment:   Intra-op Plan:   Post-operative Plan: Extubation in OR  Informed Consent: I have reviewed the patients History and Physical, chart, labs and discussed the procedure including the risks, benefits and alternatives for the proposed anesthesia with the patient or authorized representative who has indicated his/her understanding and acceptance.   Dental advisory given  Plan Discussed with: CRNA  Anesthesia Plan Comments:         Anesthesia Quick Evaluation

## 2017-06-22 NOTE — Op Note (Signed)
[facility]   MULTIDISCIPLINARY HEART VALVE TEAM   TAVR OPERATIVE NOTE   Date of Procedure:  06/22/2017  Preoperative Diagnosis: Severe Aortic Stenosis   Postoperative Diagnosis: Same   Procedure:    Transcatheter Aortic Valve Replacement - Percutaneous Left Transfemoral Approach  Edwards Sapien 3 THV (size 29 mm, model # 9600TFX, serial # [id number])   Co-Surgeons:             Gaye Pollack, MD and Sherren Mocha, MD    Anesthesiologist:  Rica Koyanagi, MD  Echocardiographer:  Loralie Champagne, MD  Pre-operative Echo Findings:  Severe aortic stenosis  Severe left ventricular systolic dysfunction   Post-operative Echo Findings:  Trivial paravalvular leak   Improved left ventricular systolic function   BRIEF CLINICAL NOTE AND INDICATIONS FOR SURGERY  The patient is an 83 year old gentleman with DM, hypertension, stage 4 chronic kidney disease, spinal stenosis s/p surgery complicated by septic shock, CAD s/p CABG in 2007 by Dr. Servando Snare, and aortic stenosis. He notes that for the past 1-2 years he has developed progressive shortness of breath and mild chest discomfort with exertion. He had an echo on 03/29/2017 that showed a mean aortic valve gradient of 26 mm Hg and a peak of 42 mm Hg. The valve was trileaflet with moderately calcified and thickened leaflets with severe restriction of leaflet mobility. His LVEF was 25-30%. His mean gradient was 19 mm Hg on the prior echo in 09/2016 and his EF was about the same. He had a cath in 10/2016 showing severe native vessel coronary artery disease with total occlusion of the LM and native RCA. The vein graft to the Ramus was occluded. There was a widely patent vein graft to PDA and PL and the LIMA to the LAD was patent. He had a dobutamine Stress echo on 04/26/2017 that showed an increase in the mean gradient from 25 at baseline to 41 mm Hg at peak dobutamine.   He has stage D severe, symptomatic aortic stenosis and severe  LV dysfunction with NYHA class III symptoms of exertional shortness of breath and fatigue with mild exertion. I have personally reviewed and interpreted his echo, cath and CT studies. He has a trileaflet aortic valve with moderately calcified and thickened leaflets with severe restriction of leaflet mobility. His LVEF is 25-30%. His mean gradient went from 25 mm Hg to 41 mm Hg with peak stress. He has a patent vein graft to his RCA territory and a patent LIMA to the LAD. I agree that AVR is indicated in this gentleman. He would be at high risk for redo sternotomy for open surgical AVR due to his age, redo status, severe LV systolic dysfunction, chronic kidney disease, reduced mobility and other comorbid medical conditions and I think TAVR would be a much better option for him. His cardiac CT shows anatomy favorable for TAVR using a Sapien 3 valve and his abdominal and pelvic CT shows anatomy suitable for transfemoral insertion.  The patient and his wife were counseled at length regarding treatment alternatives for management of severe symptomatic aortic stenosis. The risks and benefits of surgical intervention has been discussed in detail. Long-term prognosis with medical therapy was discussed. Alternative approaches such as conventional surgical aortic valve replacement, transcatheter aortic valve replacement, and palliative medical therapy were compared and contrasted at length. This discussion was placed in the context of the patient's own specific clinical presentation and past medical history. All of their questions been addressed. The patient is eager  to proceed with surgical management as soon as possible.   Following the decision to proceed with transcatheter aortic valve replacement, a discussion was held regarding what types of management strategies would be attempted intraoperatively in the event of life-threatening complications, including whether or not the patient would be considered a candidate for  the use of cardiopulmonary bypass and/or conversion to open sternotomy for attempted surgical intervention.   The patient has been advised of a variety of complications that might develop including but not limited to risks of death, stroke, paravalvular leak, aortic dissection or other major vascular complications, aortic annulus rupture, device embolization, cardiac rupture or perforation, mitral regurgitation, acute myocardial infarction, arrhythmia, heart block or bradycardia requiring permanent pacemaker placement, congestive heart failure, respiratory failure, renal failure, pneumonia, infection, other late complications related to structural valve deterioration or migration, or other complications that might ultimately cause a temporary or permanent loss of functional independence or other long term morbidity. The patient provides full informed consent for the procedure as described and all questions were answered.      DETAILS OF THE OPERATIVE PROCEDURE  PREPARATION:    The patient is brought to the operating room on the above mentioned date and central monitoring was established by the anesthesia team including placement of a central venous line and radial arterial line. The patient is placed in the supine position on the operating table.  Intravenous antibiotics are administered. General endotracheal anesthesia is induced uneventfully.  A Foley catheter is placed.  Baseline transesophageal echocardiogram was performed. The patient's chest, abdomen, both groins, and both lower extremities are prepared and draped in a sterile manner. A time out procedure is performed.   PERIPHERAL ACCESS:    Using the modified Seldinger technique, femoral arterial and venous access was obtained with placement of 6 Fr sheaths on the right side.  A pigtail diagnostic catheter was passed through the right arterial sheath under fluoroscopic guidance into the aortic root.  A temporary transvenous pacemaker  catheter was passed through the right femoral venous sheath under fluoroscopic guidance into the right ventricle.  The pacemaker was tested to ensure stable lead placement and pacemaker capture. Aortic root angiography was performed in order to determine the optimal angiographic angle for valve deployment.   TRANSFEMORAL ACCESS:   Percutaneous transfemoral access and sheath placement was performed by Dr Burt Knack. Please see his separate operative note for details. The patient was heparinized systemically and ACT verified > 250 seconds.    A 16 Fr transfemoral E-sheath was introduced into the left femoral artery after progressively dilating over an Amplatz superstiff wire. An AL-1 catheter was used to direct a straight-tip exchange length wire across the native aortic valve into the left ventricle. This was exchanged out for a pigtail catheter and position was confirmed in the LV apex. Simultaneous LV and Ao pressures were recorded.  The pigtail catheter was exchanged for an Amplatz Extra-stiff wire in the LV apex.  Echocardiography was utilized to confirm appropriate wire position and no sign of entanglement in the mitral subvalvular apparatus.   BALLOON AORTIC VALVULOPLASTY:   Not performed   TRANSCATHETER HEART VALVE DEPLOYMENT:   An Edwards Sapien 3 transcatheter heart valve (size 29 mm, model #9600TFX, serial #[id number]) was prepared and crimped per manufacturer's guidelines, and the proper orientation of the valve is confirmed on the [employer] delivery system. The valve was advanced through the introducer sheath using normal technique until in an appropriate position in the abdominal aorta beyond the sheath tip. The  balloon was then retracted and using the fine-tuning wheel was centered on the valve. The valve was then advanced across the aortic arch using appropriate flexion of the catheter. The valve was carefully positioned across the aortic valve annulus. The Commander catheter was  retracted using normal technique. Once final position of the valve has been confirmed by angiographic assessment, the valve is deployed while temporarily holding ventilation and during rapid ventricular pacing to maintain systolic blood pressure < 50 mmHg and pulse pressure < 10 mmHg. The balloon inflation is held for >3 seconds after reaching full deployment volume. Once the balloon has fully deflated the balloon is retracted into the ascending aorta and valve function is assessed using echocardiography. There is felt to be trivial paravalvular leak and no central aortic insufficiency.  The patient's hemodynamic recovery following valve deployment is good.  The deployment balloon and guidewire are both removed. Final intraoperative TEE demostrated acceptable post-procedural gradients, stable mitral valve function, trivial aortic insufficiency, and stable LV systolic function.   PROCEDURE COMPLETION:   The sheath was removed and femoral artery closure performed by Dr Burt Knack. Please see his separate report for details.  Protamine was administered once femoral arterial repair was complete. The temporary pacemaker, pigtail catheters and femoral sheaths were removed with manual pressure used for hemostasis.   The patient tolerated the procedure well and is transported to the surgical intensive care in stable condition. There were no immediate intraoperative complications. All sponge instrument and needle counts are verified correct at completion of the operation.   No blood products were administered during the operation.  The patient received a total of 30.6 mL of intravenous contrast during the procedure.   Gaye Pollack, MD 06/22/2017 11:18 AM

## 2017-06-22 NOTE — Op Note (Signed)
[facility]   MULTIDISCIPLINARY HEART VALVE TEAM   TAVR OPERATIVE NOTE   Date of Procedure:  06/22/2017  Preoperative Diagnosis: Severe Aortic Stenosis   Postoperative Diagnosis: Same   Procedure:   Transcatheter Aortic Valve Replacement - Percutaneous  Transfemoral Approach  Edwards Sapien 3 THV (size 29 mm, model # 9600TFX, serial # [id number])   Co-Surgeons:  Gilford Raid, MD and Sherren Mocha, MD  Anesthesiologist:  Rica Koyanagi, MD  Echocardiographer:  Loralie Champagne, MD  Pre-operative Echo Findings:  Severe aortic stenosis  Severe left ventricular systolic dysfunction  Post-operative Echo Findings:  trivial paravalvular leak  improved left ventricular systolic function  BRIEF CLINICAL NOTE AND INDICATIONS FOR SURGERY 83 y.o. male who presents for TAVR. He was referred by Dr Tamala Julian and Dr Aundra Dubin for further evaluation of low flow low gradient aortic stenosis.   The patient has a long history of coronary artery disease. He underwent multivessel CABG in 2007. Other medical problems include chronic kidney disease, prostate cancer status post radiation therapy, partial left nephrectomy, and development of chronic systolic heart failure. The patient was noted to have worsening LV function in 2017 with an ejection fraction of 20-25%. At that time he underwent cardiac catheterization demonstrating patency of his LIMA to LAD graft and saphenous vein graft to PDA. The vein graft to the ramus intermedius was occluded. The patient's reduced ejection fraction was felt to be far out of proportion to the extent of coronary artery disease. The patient has been very difficult to manage because of an intolerance to his heart failure regimen. He has developed hypotension with hydralazine and nitrates and his creatinine has worsened with use of an ACE or ARB. He also could not tolerate spironolactone. Dobutamine echo met criterial for severe LFLG aortic stenosis and the patient  has reported progressive symptoms of dyspnea and fatigue consistent with acute on chronic systolic heart failure, NYHA III.   During the course of the patient's preoperative work up they have been evaluated comprehensively by a multidisciplinary team of specialists coordinated through the [facility] in the [facility].  They have been demonstrated to suffer from symptomatic severe aortic stenosis as noted above. The patient has been counseled extensively as to the relative risks and benefits of all options for the treatment of severe aortic stenosis including long term medical therapy, conventional surgery for aortic valve replacement, and transcatheter aortic valve replacement.  The patient has been independently evaluated by two cardiac surgeons including Dr. Servando Snare and Dr. Cyndia Bent, and they are felt to be at high risk for conventional surgical aortic valve replacement based upon a predicted risk of mortality using the Society of Thor[employer] Surgeons risk calculator of 7.5%.  Based upon review of all of the patient's preoperative diagnostic tests they are felt to be candidate for transcatheter aortic valve replacement using the transfemoral approach as an alternative to high risk conventional surgery.    Following the decision to proceed with transcatheter aortic valve replacement, a discussion has been held regarding what types of management strategies would be attempted intraoperatively in the event of life-threatening complications, including whether or not the patient would be considered a candidate for the use of cardiopulmonary bypass and/or conversion to open sternotomy for attempted surgical intervention.  The patient has been advised of a variety of complications that might develop peculiar to this approach including but not limited to risks of death, stroke, paravalvular leak, aortic dissection or other major vascular complications, aortic  annulus  rupture, device embolization, cardiac rupture or perforation, acute myocardial infarction, arrhythmia, heart block or bradycardia requiring permanent pacemaker placement, congestive heart failure, respiratory failure, renal failure, pneumonia, infection, other late complications related to structural valve deterioration or migration, or other complications that might ultimately cause a temporary or permanent loss of functional independence or other long term morbidity.  The patient provides full informed consent for the procedure as described and all questions were answered preoperatively.  DETAILS OF THE OPERATIVE PROCEDURE PREPARATION:   The patient is brought to the operating room on the above mentioned date and central monitoring was established by the anesthesia team including placement of Swan-Ganz catheter and radial arterial line. The patient is placed in the supine position on the operating table.  Intravenous antibiotics are administered. General endotracheal anesthesia is induced uneventfully.  Baseline transesophageal echocardiogram was performed. The patient's chest, abdomen, both groins, and both lower extremities are prepared and draped in a sterile manner. A time out procedure is performed.  PERIPHERAL ACCESS:   Using the modified Seldinger technique and ultrasound guidance, femoral arterial and venous access was obtained with placement of 6 Fr sheaths on the right side.  A pigtail diagnostic catheter was passed through the right femoral arterial sheath under fluoroscopic guidance into the aortic root.  A temporary transvenous pacemaker catheter was passed through the right femoral venous sheath under fluoroscopic guidance into the right ventricle.  The pacemaker was tested to ensure stable lead placement and pacemaker capture. Aortic root angiography was performed in order to determine the optimal angiographic angle for valve deployment.  TRANSFEMORAL ACCESS:  A micropuncture technique  is used to access the left femoral artery under fluoroscopic and ultrasound guidance.   2 Perclose devices are deployed at 10' and 2' positions to 'PreClose' the femoral artery. An 8 French sheath is placed and then an Amplatz Superstiff wire is advanced through the sheath. This is changed out for a 16 French transfemoral E-Sheath after progressively dilating over the Superstiff wire.  An AL-2 catheter was used to direct a straight-tip exchange length wire across the native aortic valve into the left ventricle. This was exchanged out for a pigtail catheter and position was confirmed in the LV apex. Simultaneous LV and Ao pressures were recorded.  The pigtail catheter was exchanged for an Amplatz Extra-stiff wire in the LV apex.  Echocardiography was utilized to confirm appropriate wire position and no sign of entanglement in the mitral subvalvular apparatus.  TRANSCATHETER HEART VALVE DEPLOYMENT:  An Edwards Sapien 3 transcatheter heart valve (size 29 mm, model #9600TFX, serial #[id number]) was prepared and crimped per manufacturer's guidelines, and the proper orientation of the valve is confirmed on the [employer] delivery system. The valve was advanced through the introducer sheath using normal technique until in an appropriate position in the abdominal aorta beyond the sheath tip. The balloon was then retracted and using the fine-tuning wheel was centered on the valve. The valve was then advanced across the aortic arch using appropriate flexion of the catheter. The valve was carefully positioned across the aortic valve annulus. The Commander catheter was retracted using normal technique. Once final position of the valve has been confirmed by angiographic assessment, the valve is deployed while temporarily holding ventilation and during rapid ventricular pacing to maintain systolic blood pressure < 50 mmHg and pulse pressure < 10 mmHg. The balloon inflation is held for >3 seconds after reaching full  deployment volume. Once the balloon has fully deflated the balloon is retracted into  the ascending aorta and valve function is assessed using echocardiography. There is felt to be trivial paravalvular leak and no central aortic insufficiency.  The patient's hemodynamic recovery following valve deployment is good.  The deployment balloon and guidewire are both removed. Echo demostrated acceptable post-procedural gradients, stable mitral valve function, and trivial aortic insufficiency.  PROCEDURE COMPLETION:  The sheath was removed and femoral artery closure is performed using the 2 previously deployed Perclose devices.   There is complete hemostasis after the sutures are tightened. Protamine was administered once femoral arterial repair was complete. The temporary pacemaker, pigtail catheters and femoral sheaths were removed with manual pressure used for hemostasis.   The patient tolerated the procedure well and is transported to the surgical intensive care in stable condition. There were no immediate intraoperative complications. All sponge instrument and needle counts are verified correct at completion of the operation.   The patient received a total of 30 mL of intravenous contrast during the procedure.  Sherren Mocha, MD 06/22/2017 11:02 AM

## 2017-06-22 NOTE — Anesthesia Procedure Notes (Signed)
Procedure Name: Intubation Date/Time: 06/22/2017 8:55 AM Performed by: Barrington Ellison Pre-anesthesia Checklist: Patient identified, Emergency Drugs available, Suction available and Patient being monitored Patient Re-evaluated:Patient Re-evaluated prior to inductionOxygen Delivery Method: Circle System Utilized Preoxygenation: Pre-oxygenation with 100% oxygen Intubation Type: IV induction Ventilation: Mask ventilation without difficulty Laryngoscope Size: Mac and 4 Grade View: Grade I Tube type: Oral Tube size: 8.0 mm Number of attempts: 1 Airway Equipment and Method: Stylet Placement Confirmation: ETT inserted through vocal cords under direct vision,  positive ETCO2 and breath sounds checked- equal and bilateral Secured at: 22 cm Tube secured with: Tape Dental Injury: Teeth and Oropharynx as per pre-operative assessment  Comments: Intubated by Imagene Riches, SRNA under supervision of MDA and CRNA.

## 2017-06-22 NOTE — Progress Notes (Signed)
Patient ID: [name], male   DOB: [birth year], 83 y.o.   MRN: [id number]  SICU Evening Rounds:  Hemodynamically stable in sinus rhythm  Awake and alert, neuro intact  Groin sites ok

## 2017-06-23 ENCOUNTER — Inpatient Hospital Stay (HOSPITAL_COMMUNITY): Payer: PPO

## 2017-06-23 ENCOUNTER — Encounter (HOSPITAL_COMMUNITY): Payer: Self-pay | Admitting: Cardiovascular Disease

## 2017-06-23 DIAGNOSIS — Z954 Presence of other heart-valve replacement: Secondary | ICD-10-CM

## 2017-06-23 DIAGNOSIS — I350 Nonrheumatic aortic (valve) stenosis: Principal | ICD-10-CM

## 2017-06-23 DIAGNOSIS — I5023 Acute on chronic systolic (congestive) heart failure: Secondary | ICD-10-CM

## 2017-06-23 LAB — POCT I-STAT, CHEM 8
BUN: 24 mg/dL — AB (ref 6–20)
CREATININE: 0.7 mg/dL (ref 0.61–1.24)
Calcium, Ion: 1.27 mmol/L (ref 1.15–1.40)
Chloride: 100 mmol/L — ABNORMAL LOW (ref 101–111)
GLUCOSE: 114 mg/dL — AB (ref 65–99)
HEMATOCRIT: 31 % — AB (ref 39.0–52.0)
Hemoglobin: 10.5 g/dL — ABNORMAL LOW (ref 13.0–17.0)
POTASSIUM: 4.4 mmol/L (ref 3.5–5.1)
Sodium: 139 mmol/L (ref 135–145)
TCO2: 30 mmol/L (ref 0–100)

## 2017-06-23 LAB — CBC
HEMATOCRIT: 31 % — AB (ref 39.0–52.0)
HEMOGLOBIN: 9.7 g/dL — AB (ref 13.0–17.0)
MCH: 32.6 pg (ref 26.0–34.0)
MCHC: 31.3 g/dL (ref 30.0–36.0)
MCV: 104 fL — AB (ref 78.0–100.0)
Platelets: 162 10*3/uL (ref 150–400)
RBC: 2.98 MIL/uL — AB (ref 4.22–5.81)
RDW: 14 % (ref 11.5–15.5)
WBC: 6.8 10*3/uL (ref 4.0–10.5)

## 2017-06-23 LAB — POCT I-STAT 3, ART BLOOD GAS (G3+)
ACID-BASE EXCESS: 7 mmol/L — AB (ref 0.0–2.0)
BICARBONATE: 31.6 mmol/L — AB (ref 20.0–28.0)
O2 SAT: 100 %
PO2 ART: 237 mmHg — AB (ref 83.0–108.0)
TCO2: 33 mmol/L (ref 0–100)
pCO2 arterial: 46 mmHg (ref 32.0–48.0)
pH, Arterial: 7.446 (ref 7.350–7.450)

## 2017-06-23 LAB — BASIC METABOLIC PANEL
Anion gap: 6 (ref 5–15)
BUN: 33 mg/dL — AB (ref 6–20)
CHLORIDE: 107 mmol/L (ref 101–111)
CO2: 26 mmol/L (ref 22–32)
CREATININE: 1.66 mg/dL — AB (ref 0.61–1.24)
Calcium: 9 mg/dL (ref 8.9–10.3)
GFR calc Af Amer: 43 mL/min — ABNORMAL LOW (ref >60)
GFR calc non Af Amer: 37 mL/min — ABNORMAL LOW (ref >60)
GLUCOSE: 142 mg/dL — AB (ref 65–99)
POTASSIUM: 4.3 mmol/L (ref 3.5–5.1)
Sodium: 139 mmol/L (ref 135–145)

## 2017-06-23 LAB — GLUCOSE, CAPILLARY
GLUCOSE-CAPILLARY: 136 mg/dL — AB (ref 65–99)
GLUCOSE-CAPILLARY: 162 mg/dL — AB (ref 65–99)
Glucose-Capillary: 136 mg/dL — ABNORMAL HIGH (ref 65–99)
Glucose-Capillary: 103 mg/dL — ABNORMAL HIGH (ref 65–99)

## 2017-06-23 LAB — MAGNESIUM: MAGNESIUM: 2.2 mg/dL (ref 1.7–2.4)

## 2017-06-23 LAB — ECHOCARDIOGRAM COMPLETE
Height: 66.5 in
Weight: 3097.02 oz

## 2017-06-23 MED ORDER — CARVEDILOL 3.125 MG PO TABS
3.1250 mg | ORAL_TABLET | Freq: Two times a day (BID) | ORAL | Status: DC
  Administered 2017-06-23 – 2017-06-24 (×3): 3.125 mg via ORAL
  Filled 2017-06-23 (×3): qty 1

## 2017-06-23 MED ORDER — BISACODYL 10 MG RE SUPP: 10.0000 mg | Freq: Every day | RECTAL | Status: DC | PRN

## 2017-06-23 MED ORDER — DOCUSATE SODIUM 100 MG PO CAPS
200.0000 mg | ORAL_CAPSULE | Freq: Every day | ORAL | Status: DC
  Administered 2017-06-23 – 2017-06-24 (×2): 200 mg via ORAL
  Filled 2017-06-23 (×2): qty 2

## 2017-06-23 MED ORDER — BISACODYL 5 MG PO TBEC: 10.0000 mg | DELAYED_RELEASE_TABLET | Freq: Every day | ORAL | Status: DC | PRN

## 2017-06-23 MED ORDER — PERFLUTREN LIPID MICROSPHERE
1.0000 mL | INTRAVENOUS | Status: AC | PRN
  Administered 2017-06-23: 2 mL via INTRAVENOUS
  Filled 2017-06-23: qty 10

## 2017-06-23 MED ORDER — ONDANSETRON HCL 4 MG/2ML IJ SOLN: 4.0000 mg | Freq: Four times a day (QID) | INTRAMUSCULAR | Status: DC | PRN

## 2017-06-23 MED ORDER — GLIPIZIDE 5 MG PO TABS
5.0000 mg | ORAL_TABLET | Freq: Every day | ORAL | Status: DC
  Administered 2017-06-23: 5 mg via ORAL
  Filled 2017-06-23 (×2): qty 1

## 2017-06-23 MED ORDER — SODIUM CHLORIDE 0.9% FLUSH
3.0000 mL | Freq: Two times a day (BID) | INTRAVENOUS | Status: DC
  Administered 2017-06-23 – 2017-06-24 (×2): 3 mL via INTRAVENOUS

## 2017-06-23 MED ORDER — ACETAMINOPHEN 325 MG PO TABS: 650.0000 mg | ORAL_TABLET | Freq: Four times a day (QID) | ORAL | Status: DC | PRN

## 2017-06-23 MED ORDER — SODIUM CHLORIDE 0.9 % IV SOLN: 250.0000 mL | INTRAVENOUS | Status: DC | PRN

## 2017-06-23 MED ORDER — ONDANSETRON HCL 4 MG PO TABS: 4.0000 mg | ORAL_TABLET | Freq: Four times a day (QID) | ORAL | Status: DC | PRN

## 2017-06-23 MED ORDER — SODIUM CHLORIDE 0.9% FLUSH: 3.0000 mL | INTRAVENOUS | Status: DC | PRN

## 2017-06-23 MED ORDER — SODIUM CHLORIDE 0.9% FLUSH
3.0000 mL | Freq: Two times a day (BID) | INTRAVENOUS | Status: DC
  Administered 2017-06-23: 3 mL via INTRAVENOUS

## 2017-06-23 MED ORDER — FAMOTIDINE 20 MG PO TABS
20.0000 mg | ORAL_TABLET | Freq: Two times a day (BID) | ORAL | Status: DC
  Administered 2017-06-23 – 2017-06-24 (×3): 20 mg via ORAL
  Filled 2017-06-23 (×3): qty 1

## 2017-06-23 MED ORDER — GLIPIZIDE 10 MG PO TABS
10.0000 mg | ORAL_TABLET | Freq: Every day | ORAL | Status: DC
  Administered 2017-06-23 – 2017-06-24 (×2): 10 mg via ORAL
  Filled 2017-06-23 (×2): qty 1

## 2017-06-23 MED ORDER — OXYCODONE HCL 5 MG PO TABS
10.0000 mg | ORAL_TABLET | ORAL | Status: DC | PRN
  Administered 2017-06-23 (×3): 10 mg via ORAL
  Filled 2017-06-23 (×3): qty 2

## 2017-06-23 MED ORDER — TRAMADOL HCL 50 MG PO TABS
50.0000 mg | ORAL_TABLET | Freq: Four times a day (QID) | ORAL | Status: DC | PRN
  Administered 2017-06-23: 50 mg via ORAL
  Filled 2017-06-23: qty 1

## 2017-06-23 NOTE — Care Management Note (Signed)
Case Management Note Marvetta Gibbons RN, BSN Unit 2W-Case Manager-- Glide coverage [phone]   Patient Details  Name: [name] MRN: [id number] Date of Birth: [birth year]  Subjective/Objective:     Pt admitted s/p TAVR on 06/22/17               Action/Plan: PTA pt lived at home with spouse- anticipate home- CM to follow  Expected Discharge Date:                  Expected Discharge Plan:  Home/Self Care  In-House Referral:     Discharge planning Services  CM Consult  Post Acute Care Choice:    Choice offered to:     DME Arranged:    DME Agency:     HH Arranged:    HH Agency:     Status of Service:  In process, will continue to follow  If discussed at Long Length of Stay Meetings, dates discussed:    Additional Comments:  Dawayne Patricia, RN 06/23/2017, 8:40 AM

## 2017-06-23 NOTE — Progress Notes (Addendum)
Progress Note  Patient Name: [name] Date of Encounter: 06/23/2017  Primary Cardiologist: Dr Tamala Julian  Subjective   No CP or dyspnea. Feeling well this am.   Inpatient Medications    Scheduled Meds: . allopurinol  300 mg Oral Daily  . aspirin  81 mg Oral QPM  . carvedilol  3.125 mg Oral BID WC  . Chlorhexidine Gluconate Cloth  6 each Topical Daily  . clopidogrel  75 mg Oral Q breakfast  . glipiZIDE  10 mg Oral QAC breakfast  . glipiZIDE  5 mg Oral Q supper  . mouth rinse  15 mL Mouth Rinse BID  . multivitamin with minerals  1 tablet Oral Daily  . rosuvastatin  10 mg Oral q1800  . sodium chloride flush  10-40 mL Intracatheter Q12H  . sodium chloride flush  3 mL Intravenous Q12H  . tamsulosin  0.4 mg Oral QPC supper   Continuous Infusions: . sodium chloride 250 mL (06/23/17 0600)  . albumin human    . famotidine (PEPCID) IV    . lactated ringers    . levofloxacin (LEVAQUIN) IV    . phenylephrine (NEO-SYNEPHRINE) Adult infusion Stopped (06/22/17 1045)   PRN Meds: sodium chloride, albumin human, lactated ringers, morphine injection, ondansetron (ZOFRAN) IV, oxyCODONE, sodium chloride flush, sodium chloride flush, traMADol   Vital Signs    Vitals:   06/23/17 0400 06/23/17 0401 06/23/17 0500 06/23/17 0600  BP: (!) 111/55  (!) 109/47 (!) 108/52  Pulse: 65  64 71  Resp: 16  15 17   Temp:  99.3 F (37.4 C)    TempSrc:  Oral    SpO2: 97%  96% 92%  Weight:   193 lb 9 oz (87.8 kg)   Height:   5' 6.5" (1.689 m)     Intake/Output Summary (Last 24 hours) at 06/23/17 0827 Last data filed at 06/23/17 0600  Gross per 24 hour  Intake          2664.08 ml  Output             1765 ml  Net           899.08 ml   Filed Weights   06/22/17 0609 06/23/17 0500  Weight: 195 lb (88.5 kg) 193 lb 9 oz (87.8 kg)    Telemetry    NSR, no arrhythmia - Personally Reviewed   Physical Exam  A&Ox4, NAD GEN: No acute distress.   Neck: No JVD Cardiac: RRR, 2/6 SEM at the RUSB,  no diastolic murmur Respiratory: Clear to auscultation bilaterally. GI: Soft, nontender, non-distended  MS: No edema; No deformity. BL groin sites clear Neuro:  Nonfocal  Psych: Normal affect   Labs    Chemistry Recent Labs Lab 06/17/17 0924 06/22/17 0619  06/22/17 0956 06/22/17 1026 06/22/17 1116 06/23/17 0345  NA 141 139  < > 140 140 140 139  K 4.2 4.3  < > 4.3 4.3 4.1 4.3  CL 107 106  < > 107 107  --  107  CO2 24 23  --   --   --   --  26  GLUCOSE 155* 143*  < > 150* 155* 140* 142*  BUN 42* 51*  < > 40* 40*  --  33*  CREATININE 2.43* 1.95*  < > 1.60* 1.60*  --  1.66*  CALCIUM 9.7 10.0  --   --   --   --  9.0  PROT 6.6  --   --   --   --   --   --  ALBUMIN 3.7  --   --   --   --   --   --   AST 13*  --   --   --   --   --   --   ALT <5*  --   --   --   --   --   --   ALKPHOS 42  --   --   --   --   --   --   BILITOT 0.4  --   --   --   --   --   --   GFRNONAA 24* 31*  --   --   --   --  37*  GFRAA 27* 36*  --   --   --   --  43*  ANIONGAP 10 10  --   --   --   --  6  < > = values in this interval not displayed.   Hematology Recent Labs Lab 06/17/17 0924  06/22/17 1026 06/22/17 1116 06/23/17 0345  WBC 7.8  --   --   --  6.8  RBC 3.61*  --   --   --  2.98*  HGB 11.8*  < > 8.8* 9.9* 9.7*  HCT 37.6*  < > 26.0* 29.0* 31.0*  MCV 104.2*  --   --   --  104.0*  MCH 32.7  --   --   --  32.6  MCHC 31.4  --   --   --  31.3  RDW 14.4  --   --   --  14.0  PLT 253  --   --   --  162  < > = values in this interval not displayed.  Cardiac EnzymesNo results for input(s): TROPONINI in the last 168 hours. No results for input(s): TROPIPOC in the last 168 hours.   BNPNo results for input(s): BNP, PROBNP in the last 168 hours.   DDimer No results for input(s): DDIMER in the last 168 hours.   Radiology    Dg Chest Port 1 View  Result Date: 06/23/2017 CLINICAL DATA:  TAVR. EXAM: PORTABLE CHEST 1 VIEW COMPARISON:  06/22/2017 FINDINGS: Sternotomy wires and right IJ central  venous catheter unchanged. Evidence of previous aortic valve repair unchanged. Lungs are adequately inflated and otherwise clear. Cardiomediastinal silhouette and remainder of the exam is unchanged. IMPRESSION: No acute cardiopulmonary disease. Right IJ central venous catheter unchanged. Electronically Signed   By: Marin Olp M.D.   On: 06/23/2017 07:52   Dg Chest Port 1 View  Result Date: 06/22/2017 CLINICAL DATA:  Transcatheter aortic valve replacement EXAM: PORTABLE CHEST 1 VIEW COMPARISON:  Chest x-ray of 06/17/2017 FINDINGS: No active infiltrate or effusion is seen with mild left basilar linear atelectasis present. The aortic valve prosthetic replacement is noted. The heart size is normal. Right IJ central venous line tip overlies the mid upper SVC. IMPRESSION: 1. No active lung disease. 2. Right IJ central venous line tip overlies the mid upper SVC Electronically Signed   By: Ivar Drape M.D.   On: 06/22/2017 11:29    Cardiac Studies   2D Echo pending  Patient Profile     83 y.o. male with severe low-flow low-gradient aortic stenosis, underwent TAVR 06/22/17  Assessment & Plan    1. Severe aortic stenosis: POD #1 from TAVR. Progressing well. ASA/Plavix. 2D echo this am.   2. Acute on chronic systolic heart failure with progressive LV dysfunction. CXR clear this am.  Hold diuretics in setting of his chronic kidney disease. Seems to be improved post-TAVR. Reassess LV function on today's echo. Continue carvedilol. No ACE/ARB with CKD and hx intolerance in past.  3. Type II DM: on glipizide and SSI. CBG's 106-167  4. CKD 3: creatinine stable today. Hold demadex.  Dispo: tx 2W today - orders written by Dr Cyndia Bent. Anticipate DC home tomorrow.  Deatra James, MD  06/23/2017, 8:27 AM

## 2017-06-23 NOTE — Progress Notes (Signed)
  Echocardiogram 2D Echocardiogram has been performed.  [name] 06/23/2017, 9:23 AM

## 2017-06-23 NOTE — Progress Notes (Signed)
1 Day Post-Op Procedure(s) (LRB): TRANSCATHETER AORTIC VALVE REPLACEMENT, TRANSFEMORAL (N/A) TRANSESOPHAGEAL ECHOCARDIOGRAM (TEE) (N/A) Subjective: No complaints  Objective: Vital signs in last 24 hours: Temp:  [97.9 F (36.6 C)-99.3 F (37.4 C)] 99.3 F (37.4 C) (07/04 0401) Pulse Rate:  [62-94] 71 (07/04 0600) Cardiac Rhythm: Normal sinus rhythm (07/04 0600) Resp:  [10-22] 17 (07/04 0600) BP: (101-143)/(43-63) 108/52 (07/04 0600) SpO2:  [92 %-100 %] 92 % (07/04 0600) Arterial Line BP: (143-168)/(48-60) 151/56 (07/03 1545) Weight:  [87.8 kg (193 lb 9 oz)] 87.8 kg (193 lb 9 oz) (07/04 0500)  Hemodynamic parameters for last 24 hours:    Intake/Output from previous day: 07/03 0701 - 07/04 0700 In: 2664.1 [P.O.:440; I.V.:2024.1; IV Piggyback:200] Out: 1765 [Urine:1755; Blood:10] Intake/Output this shift: No intake/output data recorded.  General appearance: alert and cooperative Neurologic: intact Heart: regular rate and rhythm, S1, S2 normal, no murmur, click, rub or gallop Lungs: clear to auscultation bilaterally Extremities: extremities normal, atraumatic, no cyanosis or edema Wound: groin access sites look good  Lab Results:  Recent Labs  06/22/17 1116 06/23/17 0345  WBC  --  6.8  HGB 9.9* 9.7*  HCT 29.0* 31.0*  PLT  --  162   BMET:  Recent Labs  06/22/17 0619  06/22/17 1026 06/22/17 1116 06/23/17 0345  NA 139  < > 140 140 139  K 4.3  < > 4.3 4.1 4.3  CL 106  < > 107  --  107  CO2 23  --   --   --  26  GLUCOSE 143*  < > 155* 140* 142*  BUN 51*  < > 40*  --  33*  CREATININE 1.95*  < > 1.60*  --  1.66*  CALCIUM 10.0  --   --   --  9.0  < > = values in this interval not displayed.  PT/INR: No results for input(s): LABPROT, INR in the last 72 hours. ABG    Component Value Date/Time   PHART 7.305 (L) 06/22/2017 1111   HCO3 22.6 06/22/2017 1111   TCO2 24 06/22/2017 1111   ACIDBASEDEF 4.0 (H) 06/22/2017 1111   O2SAT 94.0 06/22/2017 1111   CBG (last  3)   Recent Labs  06/22/17 0608 06/22/17 2356 06/23/17 0345  GLUCAP 141* 162* 136*   CXR: clear  Assessment/Plan: S/P Procedure(s) (LRB): TRANSCATHETER AORTIC VALVE REPLACEMENT, TRANSFEMORAL (N/A) TRANSESOPHAGEAL ECHOCARDIOGRAM (TEE) (N/A)  He is hemodynamically stable in sinus rhythm. Resume previous dose of Coreg.  Stage 4 CKD: creat stable at 1.6. Hold off on diuretic  DM: glucose under control. Resume home meds  ASA and Plavix  Echo today  DC central line and transfer to 2W.     LOS: 1 day    Gaye Pollack 06/23/2017

## 2017-06-24 ENCOUNTER — Encounter (HOSPITAL_COMMUNITY): Payer: Self-pay | Admitting: Physician Assistant

## 2017-06-24 DIAGNOSIS — Z85528 Personal history of other malignant neoplasm of kidney: Secondary | ICD-10-CM

## 2017-06-24 DIAGNOSIS — I5022 Chronic systolic (congestive) heart failure: Secondary | ICD-10-CM | POA: Diagnosis present

## 2017-06-24 DIAGNOSIS — C61 Malignant neoplasm of prostate: Secondary | ICD-10-CM | POA: Diagnosis present

## 2017-06-24 DIAGNOSIS — E119 Type 2 diabetes mellitus without complications: Secondary | ICD-10-CM

## 2017-06-24 DIAGNOSIS — I5042 Chronic combined systolic (congestive) and diastolic (congestive) heart failure: Secondary | ICD-10-CM | POA: Diagnosis present

## 2017-06-24 DIAGNOSIS — I2510 Atherosclerotic heart disease of native coronary artery without angina pectoris: Secondary | ICD-10-CM | POA: Diagnosis present

## 2017-06-24 LAB — BASIC METABOLIC PANEL
Anion gap: 7 (ref 5–15)
BUN: 33 mg/dL — ABNORMAL HIGH (ref 6–20)
CO2: 26 mmol/L (ref 22–32)
Calcium: 8.9 mg/dL (ref 8.9–10.3)
Chloride: 105 mmol/L (ref 101–111)
Creatinine, Ser: 1.86 mg/dL — ABNORMAL HIGH (ref 0.61–1.24)
GFR, EST AFRICAN AMERICAN: 38 mL/min — AB (ref >60)
GFR, EST NON AFRICAN AMERICAN: 33 mL/min — AB (ref >60)
GLUCOSE: 70 mg/dL (ref 65–99)
POTASSIUM: 4.2 mmol/L (ref 3.5–5.1)
Sodium: 138 mmol/L (ref 135–145)

## 2017-06-24 MED ORDER — HYDROCODONE-ACETAMINOPHEN 10-325 MG PO TABS
2.0000 | ORAL_TABLET | Freq: Once | ORAL | Status: AC
  Administered 2017-06-24: 2 via ORAL
  Filled 2017-06-24: qty 2

## 2017-06-24 MED ORDER — CLOPIDOGREL BISULFATE 75 MG PO TABS: 75.0000 mg | ORAL_TABLET | Freq: Every day | ORAL | 1 refills | Status: DC

## 2017-06-24 MED ORDER — TORSEMIDE 20 MG PO TABS
20.0000 mg | ORAL_TABLET | ORAL | 3 refills | Status: DC
Start: 2017-06-24 — End: 2017-07-08

## 2017-06-24 MED ORDER — INDOMETHACIN 25 MG PO CAPS
50.0000 mg | ORAL_CAPSULE | Freq: Once | ORAL | Status: DC
  Filled 2017-06-24: qty 2

## 2017-06-24 NOTE — Care Management Note (Signed)
Case Management Note  Patient Details  Name: [name] MRN: [id number] Date of Birth: 01-05-1934  Subjective/Objective:                 Patient with order to DC to home. Chart reviewed, no CM consults, HH needs, Medication needs, or orders.    Action/Plan:  DC to home.  Expected Discharge Date:  06/24/17               Expected Discharge Plan:  Home/Self Care  In-House Referral:     Discharge planning Services  CM Consult  Post Acute Care Choice:    Choice offered to:     DME Arranged:    DME Agency:     HH Arranged:    HH Agency:     Status of Service:  Completed, signed off  If discussed at [employer] of Stay Meetings, dates discussed:    Additional Comments:  Carles Collet, RN 06/24/2017, 12:06 PM

## 2017-06-24 NOTE — Progress Notes (Signed)
      [address]       [place] [address]             [phone]      2 Days Post-Op Procedure(s) (LRB): TRANSCATHETER AORTIC VALVE REPLACEMENT, TRANSFEMORAL (N/A) TRANSESOPHAGEAL ECHOCARDIOGRAM (TEE) (N/A) Subjective: Feels good no issues overnight.   Objective: Vital signs in last 24 hours: Temp:  [99 F (37.2 C)-99.9 F (37.7 C)] 99.3 F (37.4 C) (07/05 0511) Pulse Rate:  [71-81] 73 (07/05 0511) Cardiac Rhythm: Normal sinus rhythm (07/04 2200) Resp:  [15-19] 19 (07/05 0511) BP: (96-126)/(45-61) 120/45 (07/05 0511) SpO2:  [92 %-98 %] 93 % (07/05 0511) Weight:  [93.5 kg (206 lb 1.6 oz)] 93.5 kg (206 lb 1.6 oz) (07/05 0511)     Intake/Output from previous day: 07/04 0701 - 07/05 0700 In: 873 [P.O.:720; I.V.:3; IV Piggyback:150] Out: 425 [Urine:425] Intake/Output this shift: No intake/output data recorded.  General appearance: alert, cooperative and no distress Heart: regular rate and rhythm, S1, S2 normal, no murmur, click, rub or gallop Lungs: clear to auscultation bilaterally Abdomen: soft, non-tender; bowel sounds normal; no masses,  no organomegaly Extremities: extremities normal, atraumatic, no cyanosis or edema Wound: groins without hematoma  Lab Results:  Recent Labs  06/22/17 1203 06/23/17 0345  WBC  --  6.8  HGB 10.5* 9.7*  HCT 31.0* 31.0*  PLT  --  162   BMET:  Recent Labs  06/23/17 0345 06/24/17 0352  NA 139 138  K 4.3 4.2  CL 107 105  CO2 26 26  GLUCOSE 142* 70  BUN 33* 33*  CREATININE 1.66* 1.86*  CALCIUM 9.0 8.9    PT/INR: No results for input(s): LABPROT, INR in the last 72 hours. ABG    Component Value Date/Time   PHART 7.446 06/22/2017 1208   HCO3 31.6 (H) 06/22/2017 1208   TCO2 33 06/22/2017 1208   ACIDBASEDEF 4.0 (H) 06/22/2017 1111   O2SAT 100.0 06/22/2017 1208   CBG (last 3)   Recent Labs  06/23/17 0345 06/23/17 0826 06/23/17 1305  GLUCAP 136* 136* 103*    Assessment/Plan: S/P  Procedure(s) (LRB): TRANSCATHETER AORTIC VALVE REPLACEMENT, TRANSFEMORAL (N/A) TRANSESOPHAGEAL ECHOCARDIOGRAM (TEE) (N/A)  CV-NSR 60s, BP stable, on Coreg and Crestor. Estimated EF 40-45%. A Bioprosthesis was present. Mobility was not restricted. No stenosis. No AI.  Pulm-tolerating room air without issue.  Renal-weight correct? Needs to re-weigh for accurate measurement. Creatinine 1.86, baseline appears to be 1.95.  H and H stable Endo-blood glucose level well controlled.  Plan: Continue DAPT with Plavix and ASA. Likely home today.    LOS: 2 days    Elgie Collard 06/24/2017

## 2017-06-24 NOTE — Discharge Instructions (Signed)
ACTIVITY AND EXERCISE °• Daily activity and exercise are an important part of your recovery. People recover at different rates depending on their general health and type of valve procedure. °• Most people require six to 10 weeks to feel recovered. °• No lifting, pushing, pulling more than 10 pounds (examples to avoid: groceries, vacuuming, gardening, golfing): °            - For one week with a procedure through the groin. °            - For six weeks for procedures through the chest wall. °            - For three months for procedures through the breast-bone. °NOTE: You will typically see one of our providers 7-10 days after your procedure to discuss WHEN TO RESUME the above activities.  °  °  °DRIVING °• Do not drive for four weeks after the date of your procedure. °• If you have been told by your doctor in the past that you may not drive, you must talk with him/her before you begin driving again. °• When you resume driving, you must have someone with you. °  °  °DRESSING °• Groin site: you may leave the clear dressing over the site for up to one week or until it falls off. °  °  °HYGIENE °• If you had a femoral (leg) procedure, you may take a shower when you return home. After the shower, pat the site dry. Do NOT use powder, oils or lotions in your groin area until the site has completely healed. °• If you had a chest procedure, you may shower when you return home unless specifically instructed not to by your discharging practitioner. °            - DO NOT scrub incision; pat dry with a towel °            - DO NOT apply any lotions, oils, powders to the incision °            - No tub baths / swimming for at least six weeks. °• If you notice any fevers, chills, increased pain, swelling, bleeding or pus, please contact your doctor. °  °ADDITIONAL INFORMATION °• If you are going to have an upcoming dental procedure, please contact our office as you may require antibiotics ahead of time to prevent infection on your  heart valve.  °  °

## 2017-06-24 NOTE — Progress Notes (Signed)
CARDIAC REHAB PHASE I   PRE:  Rate/Rhythm: 83 SR  BP:  Sitting: 117/50        SaO2: 93 RA  MODE:  Ambulation: to chair  Pt in bed, agreeable to walk, pt stood, took a few steps, c/o severe pain in R knee from gout flare, requested to sit down. Cardiac surgery discharge education completed with pt and wife at bedside. Reviewed IS, sternal precautions, activity progression, exercise, heart healthy and diabetes diet handouts, sodium restrictions, s/s heart failure, daily weights and phase 2 cardiac rehab. Pt verbalized understanding.  Pt agrees to phase 2 cardiac rehab referral, will send to [facility] per pt request. Pt in recliner, feet elevated, call bell within reach.   [phone] Lenna Sciara, RN, BSN 06/24/2017 9:36 AM

## 2017-06-24 NOTE — Progress Notes (Signed)
Discharge instructions, RX's and follow up appts explained and provided to patient and wife verbalized understanding. Patient left floor via wheelchair accompanied by staff no c/o pain or shortness of breath at d/c.  Denaly Gatling Lynn, RN  

## 2017-06-24 NOTE — Discharge Summary (Signed)
Discharge Summary    Patient ID: [name],  MRN: [id number], DOB/AGE: [id number] y.o.  Admit date: 06/22/2017 Discharge date: 06/24/2017  Primary Care Provider: Lajean Manes Primary Cardiologist: Dr. Tamala Julian / Dr. Aundra Dubin (CHF) / Dr. Burt Knack (TAVR)  Discharge Diagnoses    Principal Problem:   S/P TAVR (transcatheter aortic valve replacement) Active Problems:   Gout   Hyperlipidemia   GERD (gastroesophageal reflux disease)   Coronary artery disease involving coronary bypass graft of native heart without angina pectoris   Essential hypertension   CKD (chronic kidney disease), stage III   Acute on chronic systolic heart failure (HCC)   Severe aortic stenosis   History of renal cell cancer   Diabetes mellitus (HCC)   Prostate cancer (HCC)   Coronary artery disease   Chronic systolic CHF (congestive heart failure) (HCC)   Allergies Allergies  Allergen Reactions  . Fish Allergy Swelling    Dark fish- salmon, tuna   . Lipitor [Atorvastatin] Other (See Comments)    Muscle pain  . Penicillins Swelling    SWELLING OF THE FEET  Has patient had a PCN reaction causing immediate rash, facial/tongue/throat swelling, SOB or lightheadedness with hypotension: no Has patient had a PCN reaction causing severe rash involving mucus membranes or skin necrosis: no Has patient had a PCN reaction that required hospitalization no Has patient had a PCN reaction occurring within the last 10 years: no If all of the above answers are "NO", then may proceed with Cephalosporin use.  . Simvastatin Other (See Comments)    MUSCLE PAIN  . Adacel [Diphth-Acell Pertussis-Tetanus]     UNSPECIFIED REACTION   . Lasix [Furosemide] Swelling    SWELLING REACTION UNSPECIFIED   . Septra [Sulfamethoxazole-Trimethoprim]     UNSPECIFIED REACTION   . Flexeril [Cyclobenzaprine] Other (See Comments)    Causes confusion  . Pantoprazole Diarrhea    Diagnostic Studies/Procedures    Procedure:  TAVR  06/22/17  Transcatheter Aortic Valve Replacement - Percutaneous Left Transfemoral Approach             Edwards Sapien 3 THV (size 29 mm, model # 9600TFX, serial # [id number])              Co-Surgeons: Gaye Pollack, MD and Sherren Mocha, MD  Pre-operative Echo Findings: ? Severe aortic stenosis ? Severe left ventricular systolic dysfunction   Post-operative Echo Findings: ? Trivial paravalvular leak ?  Improved left ventricular systolic function  _____________  2D ECHO: 06/23/2017 LV EF: 40% -   45% Study Conclusions - Left ventricle: The cavity size was normal. Wall thickness was   increased in a pattern of mild LVH. Systolic function was mildly   to moderately reduced. The estimated ejection fraction was in the   range of 40% to 45%. Diffuse hypokinesis. Doppler parameters are   consistent with abnormal left ventricular relaxation (grade 1   diastolic dysfunction). Doppler parameters are consistent with   high ventricular filling pressure. - Aortic valve: A bioprosthesis was present. - Aortic root: The aortic root was mildly dilated. - Mitral valve: Calcified annulus. Impressions: - Technically difficult; definity used; mild to moderate global   reduction in LV systolic function; mild diastolic dysfunction   with elevated LV filling pressure; mild LVH; mildly dilated   aortic root; s/p TAVR with normal gradients and no AI.    History of Present Illness    [name] is a 83 y.o. male with a history of DMT2, HTN, stage  IV CKD, protate cancer s/p radiation, RCC s/p partial L nephrectomy, HLD, CAD s/p CABG (2007) known SVG--> Ramus occlusion, chronic systolic CHF and severe stage D low gradient aortic stenosis who presented to [facility] on 06/22/17 for planned TAVR.   Patient had CABG in 2007. In 09/2016, he had an echo showing EF down to 20-25%.  With fall in EF, he ended up having a right and left heart cath done in 10/2016.  The cath showed normal right and left heart  filling pressures.  The LIMA-LAD and SVG-PDA were patent but the SVG-ramus was occluded.  The fall in EF was thought to be disproportionate to the coronary disease.   The patient has been very difficult to manage because of an intolerance to his heart failure regimen. He has developed hypotension with hydralazine and nitrates and his creatinine has worsened with use of an ACE or ARB. He also could not tolerate spironolactone.  Repeat 2D ECHO 03/29/17 showed EF 25-30% and possibly low flow severe AS. Dobutamine stress echo 04/26/17 confirmed low gradient severe AS. He was referred to [place] for evaluation for TAVR. At that time he complained of progressive fatigue and shortness of breath over the last 9 months making daily activities more difficult. Dr. Burt Knack felt his severe global and segmental LV dysfunction was related to a combination of ischemic and valvular heart disease. In terms of his CAD with known occlusion of SVG--> ramus, there were no good targets for coronary intervention or further revascularization.  He was felt to be high risk for cardiac surgery with his history of previous sternotomy, advanced age, chronic kidney disease, advanced left ventricular dysfunction, and diabetes and TAVR was recommended.   His cardiac CT showed anatomy favorable for TAVR using a Sapien 3 valve and his abdominal and pelvic CT showed anatomy suitable for transfemoral insertion. He was seen by Dr. Servando Snare and Dr. Cyndia Bent with [facility] who were both in agreement and he was set up for TAVR on 06/22/17.   Hospital Course     Consultants:  [facility]  1. Severe aortic stenosis: progressing well after surgery. He has been placed on ASA/Plavix. 2D echo today shows normal function of the aortic bioprosthesis without paravalvular regurgitation. He will be seen in 1 month by Dr. Burt Knack with a repeat echo   2. Acute on chronic systolic heart failure with progressive LV dysfunction: some improvement of LV systolic dysfunction  now with LVEF estimated at 45%. Continue coreg. Hold ACE/ARB with CKD and past intolerance. Resume demadex but take every other day. He has a previously scheduled CHF appointment on 07/08/17. He will need a BMET at this appointment.  3. Type II DM: on glipizide and SSI while admitted. CBGs have been in good range.   4. CKD 3: creatinine 1.86 today, essentially at baseline  5. Left knee pain (likely gout): has responded well to Indocin in the past (which is not a good option for extended treatment with CKD.) However, he reported a good response generally with only 1-2 doses so he was treated with one dose while admitted.   _____________  Discharge Vitals Blood pressure (!) 120/45, pulse 73, temperature 99.3 F (37.4 C), temperature source Oral, resp. rate 19, height 5' 6.5" (1.689 m), weight 206 lb 1.6 oz (93.5 kg), SpO2 93 %.  Filed Weights   06/22/17 0609 06/23/17 0500 06/24/17 0511  Weight: 195 lb (88.5 kg) 193 lb 9 oz (87.8 kg) 206 lb 1.6 oz (93.5 kg)    Labs & Radiologic Studies  CBC  Recent Labs  06/22/17 1203 06/23/17 0345  WBC  --  6.8  HGB 10.5* 9.7*  HCT 31.0* 31.0*  MCV  --  104.0*  PLT  --  426   Basic Metabolic Panel  Recent Labs  06/23/17 0345 06/24/17 0352  NA 139 138  K 4.3 4.2  CL 107 105  CO2 26 26  GLUCOSE 142* 70  BUN 33* 33*  CREATININE 1.66* 1.86*  CALCIUM 9.0 8.9  MG 2.2  --    Liver Function Tests No results for input(s): AST, ALT, ALKPHOS, BILITOT, PROT, ALBUMIN in the last 72 hours. No results for input(s): LIPASE, AMYLASE in the last 72 hours. Cardiac Enzymes No results for input(s): CKTOTAL, CKMB, CKMBINDEX, TROPONINI in the last 72 hours. BNP Invalid input(s): POCBNP D-Dimer No results for input(s): DDIMER in the last 72 hours. Hemoglobin A1C No results for input(s): HGBA1C in the last 72 hours. Fasting Lipid Panel No results for input(s): CHOL, HDL, LDLCALC, TRIG, CHOLHDL, LDLDIRECT in the last 72 hours. Thyroid Function  Tests No results for input(s): TSH, T4TOTAL, T3FREE, THYROIDAB in the last 72 hours.  Invalid input(s): FREET3 _____________  Dg Chest 2 View  Result Date: 06/17/2017 CLINICAL DATA:  Preop evaluation for upcoming aortic valve replacement EXAM: CHEST  2 VIEW COMPARISON:  06/11/2017 FINDINGS: Cardiac shadows within normal limits. Postsurgical changes are noted. The lungs are well-aerated without focal infiltrate or sizable effusion. No bony abnormality is noted. IMPRESSION: No active cardiopulmonary disease. Electronically Signed   By: Inez Catalina M.D.   On: 06/17/2017 10:43   Ct Coronary Morph W/cta Cor W/score W/ca W/cm &/or Wo/cm  Addendum Date: 06/11/2017   ADDENDUM REPORT: 06/11/2017 16:08 CLINICAL DATA:  83 year old male with severe aortic stenosis being evaluated for TAVR. EXAM: Cardiac TAVR CT TECHNIQUE: The patient was scanned on a Philips 256 scanner. A 120 kV retrospective scan was triggered in the descending thoracic aorta at 111 HU's. Gantry rotation speed was 270 msecs and collimation was .9 mm. 10 mg of iv Metoprolol and no nitro were given. The 3D data set was reconstructed in 5% intervals of the R-R cycle. Systolic and diastolic phases were analyzed on a dedicated work station using MPR, MIP and VRT modes. The patient received 80 cc of contrast. FINDINGS: Aortic Valve: Trileaflet, moderately thickened and calcified aortic valve with severely restricted leaflet opening. No calcifications extending into the LVOT. LVOT is large. Aorta:  Normal size, trivial calcifications.  No dissection. Sinotubular Junction:  30 x 28 mm Ascending Thoracic Aorta:  37 x 36 mm Aortic Arch:  Not visualized Descending Thoracic Aorta:  26 x 23 mm Sinus of Valsalva Measurements: Non-coronary:  34 mm Right -coronary:  29 mm Left -coronary:  34 mm Coronary Artery Height above Annulus: Left Main:  14 mm Right Coronary:  13 mm Virtual Basal Annulus Measurements: Maximum/Minimum Diameter:  28 x 24 mm Perimeter:  84 mm  Area:  535 mm2 Optimum Fluoroscopic Angle for Delivery:  LAO 0 CAU 0 IMPRESSION: 1. Trileaflet, moderately thickened and calcified aortic valve with severely restricted leaflet opening. No calcifications extending into the LVOT. LVOT is large and makes annular sizing challenging. Annular measurements suitable for a delivery of a 26 mm Edwards-SAPIEN 3 valve. 2.  Sufficient annulus to coronary distance. 3. Optimum Fluoroscopic Angle for Delivery:  LAO 0 CAU 0 4.  No thrombus in the left atrial appendage. Ena Dawley Electronically Signed   By: Ena Dawley   On: 06/11/2017 16:08  Result Date: 06/11/2017 EXAM: OVER-READ INTERPRETATION  CT CHEST The following report is an over-read performed by radiologist Dr. Collene Leyden [facility], [place] on 06/11/2017. This over-read does not include interpretation of cardiac or coronary anatomy or pathology. The coronary CTA interpretation by the cardiologist is attached. COMPARISON:  06/03/2017 FINDINGS: Cardiovascular: Heart is normal size. Extensive calcifications throughout the aorta which is normal caliber. No dissection. Mediastinum/Nodes: No adenopathy in the visualized lower mediastinum or hila. Calcified left hilar lymph nodes. Lungs/Pleura: Visualized lungs are clear.  No effusions. Upper Abdomen: Imaging into the upper abdomen shows no acute findings. Musculoskeletal: No acute bony abnormality. Chest wall soft tissues unremarkable. Prior median sternotomy and CABG. IMPRESSION: No acute or significant extracardiac abnormality. Electronically Signed: By: Rolm Baptise M.D. On: 06/11/2017 14:41   Dg Chest Port 1 View  Result Date: 06/23/2017 CLINICAL DATA:  TAVR. EXAM: PORTABLE CHEST 1 VIEW COMPARISON:  06/22/2017 FINDINGS: Sternotomy wires and right IJ central venous catheter unchanged. Evidence of previous aortic valve repair unchanged. Lungs are adequately inflated and otherwise clear. Cardiomediastinal silhouette and remainder of the exam is  unchanged. IMPRESSION: No acute cardiopulmonary disease. Right IJ central venous catheter unchanged. Electronically Signed   By: Marin Olp M.D.   On: 06/23/2017 07:52   Dg Chest Port 1 View  Result Date: 06/22/2017 CLINICAL DATA:  Transcatheter aortic valve replacement EXAM: PORTABLE CHEST 1 VIEW COMPARISON:  Chest x-ray of 06/17/2017 FINDINGS: No active infiltrate or effusion is seen with mild left basilar linear atelectasis present. The aortic valve prosthetic replacement is noted. The heart size is normal. Right IJ central venous line tip overlies the mid upper SVC. IMPRESSION: 1. No active lung disease. 2. Right IJ central venous line tip overlies the mid upper SVC Electronically Signed   By: Ivar Drape M.D.   On: 06/22/2017 11:29   Ct Angio Chest Aorta W &/or Wo Contrast  Result Date: 06/03/2017 CLINICAL DATA:  83 year old male with history of severe aortic stenosis. Preprocedural study prior to potential transcatheter aortic valve replacement (TAVR) procedure. EXAM: CT ANGIOGRAPHY CHEST, ABDOMEN AND PELVIS TECHNIQUE: Multidetector CT imaging through the chest, abdomen and pelvis was performed using the standard protocol during bolus administration of intravenous contrast. Multiplanar reconstructed images and MIPs were obtained and reviewed to evaluate the vascular anatomy. CONTRAST:  80 mL of Isovue 370. COMPARISON:  CT the abdomen and pelvis 11/10/2016. FINDINGS: CTA CHEST FINDINGS Cardiovascular: Heart size is normal. There is no significant pericardial fluid, thickening or pericardial calcification. There is aortic atherosclerosis, as well as atherosclerosis of the great vessels of the mediastinum and the coronary arteries, including calcified atherosclerotic plaque in the left main, left anterior descending, left circumflex and right coronary arteries. Status post median sternotomy for CABG including LIMA to the LAD. Severe thickening calcification of the aortic valve. Calcification of the  mitral valve and mitral annulus. Mediastinum/Lymph Nodes: No pathologically enlarged mediastinal or hilar lymph nodes. Esophagus is unremarkable in appearance. No axillary lymphadenopathy. Lungs/Pleura: 6 mm subpleural nodule in the posterior aspect of the right upper lobe (axial image 65 of series 6). No acute consolidative airspace disease. No pleural effusions. Musculoskeletal/Soft Tissues: Median sternotomy wires. There are no aggressive appearing lytic or blastic lesions noted in the visualized portions of the skeleton. CTA ABDOMEN AND PELVIS FINDINGS Hepatobiliary: No cystic or solid hepatic lesions. No intra or extrahepatic biliary ductal dilatation. Gallbladder is normal in appearance. Pancreas: No pancreatic mass. No pancreatic ductal dilatation. No pancreatic or peripancreatic fluid or inflammatory changes. Spleen:  Small calcified granulomas in the spleen. Adrenals/Urinary Tract: Small nonobstructive calculi are present within the collecting systems of the kidneys bilaterally, largest of which measures 3 mm in the interpolar region of the right kidney. Mild atrophy and multifocal cortical thinning in the kidneys bilaterally. Multiple subcentimeter low-attenuation lesions in the kidneys bilaterally, too small to characterize, but favored to represent cysts. In addition, there is an exophytic low-attenuation lesion in the lower pole of the right kidney, compatible with a simple cyst. No hydroureteronephrosis. Urinary bladder is normal in appearance. Bilateral adrenal glands are normal in appearance. Stomach/Bowel: The appearance of the stomach is normal. There is no pathologic dilatation of small bowel or colon. Numerous colonic diverticulae are noted, without surrounding inflammatory changes to suggest an acute diverticulitis at this time. The appendix is not confidently identified and may be surgically absent. Regardless, there are no inflammatory changes noted adjacent to the cecum to suggest the presence  of an acute appendicitis at this time. Vascular/Lymphatic: Extensive aortic atherosclerosis throughout the abdominal and pelvic vasculature, with vascular findings and measurements pertinent to potential TAVR procedure, as detailed below. No aneurysm or dissection noted in the abdomen or pelvis. Celiac axis, superior mesenteric artery and inferior mesenteric artery are all widely patent without hemodynamically significant stenosis. Bilateral renal arteries are widely patent. No lymphadenopathy noted in the abdomen or pelvis. Reproductive: Numerous brachytherapy implants are noted throughout the prostate gland. Seminal vesicles are unremarkable in appearance. Other: 2 tiny supraumbilical ventral hernias containing only omental fat. No significant volume of ascites. No pneumoperitoneum. Musculoskeletal: There are no aggressive appearing lytic or blastic lesions noted in the visualized portions of the skeleton. VASCULAR MEASUREMENTS PERTINENT TO TAVR: AORTA: Minimal Aortic Diameter -  13 x 13 mm Severity of Aortic Calcification -  severe RIGHT PELVIS: Right Common Iliac Artery - Minimal Diameter - 3.9 x 3.7 mm Tortuosity - mild Calcification - severe Right External Iliac Artery - Minimal Diameter - 7.1 x 6.9 mm Tortuosity - mild Calcification - mild to moderate Right Common Femoral Artery - Minimal Diameter - 8.8 x 5.8 mm Tortuosity - mild Calcification - moderate LEFT PELVIS: Left Common Iliac Artery - Minimal Diameter - 8.1 x 8.0 mm Tortuosity - moderate Calcification - moderate Left External Iliac Artery - Minimal Diameter - 7.8 x 7.7 mm Tortuosity - moderate Calcification - mild to moderate Left Common Femoral Artery - Minimal Diameter - 9.0 x 8.1 mm Tortuosity - mild Calcification - moderate Review of the MIP images confirms the above findings. IMPRESSION: 1. Vascular findings and measurements pertinent to potential TAVR procedure, as detailed above. The patient has suitable pelvic arterial access on the left  side. 2. Severe thickening calcification of the aortic valve, compatible with the reported clinical history of severe aortic stenosis. 3. Colonic diverticulosis without evidence of acute diverticulitis at this time. 4. 6 mm subpleural nodule in the posterior aspect of the right upper lobe. Non-contrast chest CT at 6-12 months is recommended. If the nodule is stable at time of repeat CT, then future CT at 18-24 months (from today's scan) is considered optional for low-risk patients, but is recommended for high-risk patients. This recommendation follows the consensus statement: Guidelines for Management of Incidental Pulmonary Nodules Detected on CT Images: From the Fleischner Society 2017; Radiology 2017; 284:228-243. 5. Additional incidental findings, as above. Electronically Signed   By: Vinnie Langton M.D.   On: 06/03/2017 15:48   Ct Angio Abd/pel W/ And/or W/o  Result Date: 06/03/2017 CLINICAL DATA:  83 year old male with  history of severe aortic stenosis. Preprocedural study prior to potential transcatheter aortic valve replacement (TAVR) procedure. EXAM: CT ANGIOGRAPHY CHEST, ABDOMEN AND PELVIS TECHNIQUE: Multidetector CT imaging through the chest, abdomen and pelvis was performed using the standard protocol during bolus administration of intravenous contrast. Multiplanar reconstructed images and MIPs were obtained and reviewed to evaluate the vascular anatomy. CONTRAST:  80 mL of Isovue 370. COMPARISON:  CT the abdomen and pelvis 11/10/2016. FINDINGS: CTA CHEST FINDINGS Cardiovascular: Heart size is normal. There is no significant pericardial fluid, thickening or pericardial calcification. There is aortic atherosclerosis, as well as atherosclerosis of the great vessels of the mediastinum and the coronary arteries, including calcified atherosclerotic plaque in the left main, left anterior descending, left circumflex and right coronary arteries. Status post median sternotomy for CABG including LIMA to the  LAD. Severe thickening calcification of the aortic valve. Calcification of the mitral valve and mitral annulus. Mediastinum/Lymph Nodes: No pathologically enlarged mediastinal or hilar lymph nodes. Esophagus is unremarkable in appearance. No axillary lymphadenopathy. Lungs/Pleura: 6 mm subpleural nodule in the posterior aspect of the right upper lobe (axial image 65 of series 6). No acute consolidative airspace disease. No pleural effusions. Musculoskeletal/Soft Tissues: Median sternotomy wires. There are no aggressive appearing lytic or blastic lesions noted in the visualized portions of the skeleton. CTA ABDOMEN AND PELVIS FINDINGS Hepatobiliary: No cystic or solid hepatic lesions. No intra or extrahepatic biliary ductal dilatation. Gallbladder is normal in appearance. Pancreas: No pancreatic mass. No pancreatic ductal dilatation. No pancreatic or peripancreatic fluid or inflammatory changes. Spleen: Small calcified granulomas in the spleen. Adrenals/Urinary Tract: Small nonobstructive calculi are present within the collecting systems of the kidneys bilaterally, largest of which measures 3 mm in the interpolar region of the right kidney. Mild atrophy and multifocal cortical thinning in the kidneys bilaterally. Multiple subcentimeter low-attenuation lesions in the kidneys bilaterally, too small to characterize, but favored to represent cysts. In addition, there is an exophytic low-attenuation lesion in the lower pole of the right kidney, compatible with a simple cyst. No hydroureteronephrosis. Urinary bladder is normal in appearance. Bilateral adrenal glands are normal in appearance. Stomach/Bowel: The appearance of the stomach is normal. There is no pathologic dilatation of small bowel or colon. Numerous colonic diverticulae are noted, without surrounding inflammatory changes to suggest an acute diverticulitis at this time. The appendix is not confidently identified and may be surgically absent. Regardless, there  are no inflammatory changes noted adjacent to the cecum to suggest the presence of an acute appendicitis at this time. Vascular/Lymphatic: Extensive aortic atherosclerosis throughout the abdominal and pelvic vasculature, with vascular findings and measurements pertinent to potential TAVR procedure, as detailed below. No aneurysm or dissection noted in the abdomen or pelvis. Celiac axis, superior mesenteric artery and inferior mesenteric artery are all widely patent without hemodynamically significant stenosis. Bilateral renal arteries are widely patent. No lymphadenopathy noted in the abdomen or pelvis. Reproductive: Numerous brachytherapy implants are noted throughout the prostate gland. Seminal vesicles are unremarkable in appearance. Other: 2 tiny supraumbilical ventral hernias containing only omental fat. No significant volume of ascites. No pneumoperitoneum. Musculoskeletal: There are no aggressive appearing lytic or blastic lesions noted in the visualized portions of the skeleton. VASCULAR MEASUREMENTS PERTINENT TO TAVR: AORTA: Minimal Aortic Diameter -  13 x 13 mm Severity of Aortic Calcification -  severe RIGHT PELVIS: Right Common Iliac Artery - Minimal Diameter - 3.9 x 3.7 mm Tortuosity - mild Calcification - severe Right External Iliac Artery - Minimal Diameter - 7.1 x 6.9  mm Tortuosity - mild Calcification - mild to moderate Right Common Femoral Artery - Minimal Diameter - 8.8 x 5.8 mm Tortuosity - mild Calcification - moderate LEFT PELVIS: Left Common Iliac Artery - Minimal Diameter - 8.1 x 8.0 mm Tortuosity - moderate Calcification - moderate Left External Iliac Artery - Minimal Diameter - 7.8 x 7.7 mm Tortuosity - moderate Calcification - mild to moderate Left Common Femoral Artery - Minimal Diameter - 9.0 x 8.1 mm Tortuosity - mild Calcification - moderate Review of the MIP images confirms the above findings. IMPRESSION: 1. Vascular findings and measurements pertinent to potential TAVR procedure, as  detailed above. The patient has suitable pelvic arterial access on the left side. 2. Severe thickening calcification of the aortic valve, compatible with the reported clinical history of severe aortic stenosis. 3. Colonic diverticulosis without evidence of acute diverticulitis at this time. 4. 6 mm subpleural nodule in the posterior aspect of the right upper lobe. Non-contrast chest CT at 6-12 months is recommended. If the nodule is stable at time of repeat CT, then future CT at 18-24 months (from today's scan) is considered optional for low-risk patients, but is recommended for high-risk patients. This recommendation follows the consensus statement: Guidelines for Management of Incidental Pulmonary Nodules Detected on CT Images: From the [employer] 2017; Radiology 2017; 284:228-243. 5. Additional incidental findings, as above. Electronically Signed   By: Vinnie Langton M.D.   On: 06/03/2017 15:48   Disposition   Pt is being discharged home today in good condition.  Follow-up Plans & Appointments    Follow-up Information    Larey Dresser, MD. Go on 07/08/2017.   Specialty:  Cardiology Why:  @ 10:30am Contact information: [place] [address] [phone]        Sherren Mocha, MD. Daphane Shepherd on 07/15/2017.   Specialty:  Cardiology Why:  @ 3pm for an echo and then follow up with Dr. Barnie Mort information: [address] [phone]          Discharge Instructions    Amb Referral to Cardiac Rehabilitation    Complete by:  As directed    Diagnosis:  Valve Replacement   Valve:  Aortic Comment - s/p TAVR   Diet - low sodium heart healthy    Complete by:  As directed    Discharge instructions    Complete by:  As directed    ACTIVITY AND EXERCISE . Daily activity and exercise are an important part of your recovery. People recover at different rates depending on their general health and type of valve  procedure. . Most people require six to 10 weeks to feel recovered. . No lifting, pushing, pulling more than 10 pounds (examples to avoid: groceries, vacuuming, gardening, golfing):             - For one week with a procedure through the groin.             - For six weeks for procedures through the chest wall.             - For three months for procedures through the breast-bone. NOTE: You will typically see one of our providers 7-10 days after your procedure to discuss [place] the above activities.    DRIVING . Do not drive for four weeks after the date of your procedure. . If you have been told by your doctor in the past that you may not drive,  you must talk with him/her before you begin driving again. . When you resume driving, you must have someone with you.   DRESSING . Groin site: you may leave the clear dressing over the site for up to one week or until it falls off.   HYGIENE . If you had a femoral (leg) procedure, you may take a shower when you return home. After the shower, pat the site dry. Do NOT use powder, oils or lotions in your groin area until the site has completely healed. . If you had a chest procedure, you may shower when you return home unless specifically instructed not to by your discharging practitioner.             - DO NOT scrub incision; pat dry with a towel             - DO NOT apply any lotions, oils, powders to the incision             - No tub baths / swimming for at least six weeks. . If you notice any fevers, chills, increased pain, swelling, bleeding or pus, please contact your doctor.  ADDITIONAL INFORMATION . If you are going to have an upcoming dental procedure, please contact our office as you may require antibiotics ahead of time to prevent infection on your heart valve.     Increase activity slowly    Complete by:  As directed       Discharge Medications   Current Discharge Medication List    START taking these medications    Details  clopidogrel (PLAVIX) 75 MG tablet Take 1 tablet (75 mg total) by mouth daily with breakfast. Qty: 90 tablet, Refills: 1      CONTINUE these medications which have CHANGED   Details  torsemide (DEMADEX) 20 MG tablet Take 1 tablet (20 mg total) by mouth every other day. Qty: 45 tablet, Refills: 3      CONTINUE these medications which have NOT CHANGED   Details  alendronate (FOSAMAX) 70 MG tablet Take 70 mg by mouth once a week. Take with a full glass of water on an empty stomach.    allopurinol (ZYLOPRIM) 300 MG tablet Take 300 mg by mouth daily. Refills: 2    aspirin 81 MG chewable tablet Chew 81 mg by mouth every evening.     Calcium Carbonate-Vitamin D 600-200 MG-UNIT TABS Take 1 tablet by mouth daily.    carvedilol (COREG) 3.125 MG tablet TAKE 1 TABLET BY MOUTH TWICE DAILY Qty: 60 tablet, Refills: 0    diclofenac sodium (VOLTAREN) 1 % GEL Apply 2 g topically daily as needed (pain).    glipiZIDE (GLUCOTROL) 10 MG tablet Take 5-10 mg by mouth 2 (two) times daily before a meal. 10 mg in the morning and 5 mg in the evening Refills: 2    HYDROcodone-acetaminophen (NORCO) 10-325 MG tablet Take 1-2 tablets by mouth every 6 (six) hours as needed for moderate pain. Qty: 10 tablet, Refills: 0    Liniments (SALONPAS PAIN RELIEF PATCH EX) Apply 1 patch topically daily as needed (pain).    Multiple Vitamin (MULTIVITAMIN) tablet Take 1 tablet by mouth daily.    rosuvastatin (CRESTOR) 10 MG tablet Take 10 mg by mouth daily.    tamsulosin (FLOMAX) 0.4 MG CAPS capsule Take 0.4 mg by mouth daily after supper.      STOP taking these medications     Magnesium Oxide 200 MG TABS  Outstanding Labs/Studies   BMET  Duration of Discharge Encounter   Greater than 30 minutes including physician time.  Signed, Angelena Form NP 06/24/2017, 12:00 PM

## 2017-06-24 NOTE — Progress Notes (Addendum)
Progress Note  Patient Name: [name] Date of Encounter: 06/24/2017  Primary Cardiologist: Dr Tamala Julian  Subjective   Denies chest pain or shortness of breath. Complaining of left knee pain.  Inpatient Medications    Scheduled Meds: . allopurinol  300 mg Oral Daily  . aspirin  81 mg Oral QPM  . carvedilol  3.125 mg Oral BID WC  . clopidogrel  75 mg Oral Q breakfast  . docusate sodium  200 mg Oral Daily  . famotidine  20 mg Oral BID  . glipiZIDE  10 mg Oral QAC breakfast  . glipiZIDE  5 mg Oral Q supper  . multivitamin with minerals  1 tablet Oral Daily  . rosuvastatin  10 mg Oral q1800  . sodium chloride flush  3 mL Intravenous Q12H  . sodium chloride flush  3 mL Intravenous Q12H  . tamsulosin  0.4 mg Oral QPC supper   Continuous Infusions: . sodium chloride     PRN Meds: sodium chloride, acetaminophen, bisacodyl **OR** bisacodyl, ondansetron **OR** ondansetron (ZOFRAN) IV, oxyCODONE, sodium chloride flush, traMADol   Vital Signs    Vitals:   06/23/17 1428 06/23/17 1754 06/23/17 2020 06/24/17 0511  BP: (!) 126/57  118/61 (!) 120/45  Pulse: 81  75 73  Resp:   18 19  Temp: 99.1 F (37.3 C) 99.5 F (37.5 C) 99.7 F (37.6 C) 99.3 F (37.4 C)  TempSrc: Oral Oral Oral Oral  SpO2: 96%  93% 93%  Weight:    206 lb 1.6 oz (93.5 kg)  Height:        Intake/Output Summary (Last 24 hours) at 06/24/17 0746 Last data filed at 06/23/17 2100  Gross per 24 hour  Intake              873 ml  Output              425 ml  Net              448 ml   Filed Weights   06/22/17 0609 06/23/17 0500 06/24/17 0511  Weight: 195 lb (88.5 kg) 193 lb 9 oz (87.8 kg) 206 lb 1.6 oz (93.5 kg)    Telemetry    Normal sinus rhythm heart rate in the 70s, no arrhythmia appreciated - Personally Reviewed   Physical Exam  My exam today: Vitals:   06/23/17 2020 06/24/17 0511  BP: 118/61 (!) 120/45  Pulse: 75 73  Resp: 18 19  Temp: 99.7 F (37.6 C) 99.3 F (37.4 C)   Pt is alert and  oriented, NAD HEENT: normal Neck: JVP - normal Lungs: CTA bilaterally CV: RRR with 1/6 systolic murmur at the RUSB Abd: soft, NT, Positive BS, no hepatomegaly Ext: no C/C/E, distal pulses intact and equal, bilateral groin sites clear. Left knee swelling noted Skin: warm/dry no rash  Labs    Chemistry Recent Labs Lab 06/17/17 0924 06/22/17 0619  06/22/17 1203 06/23/17 0345 06/24/17 0352  NA 141 139  < > 139 139 138  K 4.2 4.3  < > 4.4 4.3 4.2  CL 107 106  < > 100* 107 105  CO2 24 23  --   --  26 26  GLUCOSE 155* 143*  < > 114* 142* 70  BUN 42* 51*  < > 24* 33* 33*  CREATININE 2.43* 1.95*  < > 0.70 1.66* 1.86*  CALCIUM 9.7 10.0  --   --  9.0 8.9  PROT 6.6  --   --   --   --   --  ALBUMIN 3.7  --   --   --   --   --   AST 13*  --   --   --   --   --   ALT <5*  --   --   --   --   --   ALKPHOS 42  --   --   --   --   --   BILITOT 0.4  --   --   --   --   --   GFRNONAA 24* 31*  --   --  37* 33*  GFRAA 27* 36*  --   --  43* 38*  ANIONGAP 10 10  --   --  6 7  < > = values in this interval not displayed.   Hematology Recent Labs Lab 06/17/17 0924  06/22/17 1116 06/22/17 1203 06/23/17 0345  WBC 7.8  --   --   --  6.8  RBC 3.61*  --   --   --  2.98*  HGB 11.8*  < > 9.9* 10.5* 9.7*  HCT 37.6*  < > 29.0* 31.0* 31.0*  MCV 104.2*  --   --   --  104.0*  MCH 32.7  --   --   --  32.6  MCHC 31.4  --   --   --  31.3  RDW 14.4  --   --   --  14.0  PLT 253  --   --   --  162  < > = values in this interval not displayed.  Cardiac EnzymesNo results for input(s): TROPONINI in the last 168 hours. No results for input(s): TROPIPOC in the last 168 hours.   BNPNo results for input(s): BNP, PROBNP in the last 168 hours.   DDimer No results for input(s): DDIMER in the last 168 hours.   Radiology    Dg Chest Port 1 View  Result Date: 06/23/2017 CLINICAL DATA:  TAVR. EXAM: PORTABLE CHEST 1 VIEW COMPARISON:  06/22/2017 FINDINGS: Sternotomy wires and right IJ central venous catheter  unchanged. Evidence of previous aortic valve repair unchanged. Lungs are adequately inflated and otherwise clear. Cardiomediastinal silhouette and remainder of the exam is unchanged. IMPRESSION: No acute cardiopulmonary disease. Right IJ central venous catheter unchanged. Electronically Signed   By: Marin Olp M.D.   On: 06/23/2017 07:52   Dg Chest Port 1 View  Result Date: 06/22/2017 CLINICAL DATA:  Transcatheter aortic valve replacement EXAM: PORTABLE CHEST 1 VIEW COMPARISON:  Chest x-ray of 06/17/2017 FINDINGS: No active infiltrate or effusion is seen with mild left basilar linear atelectasis present. The aortic valve prosthetic replacement is noted. The heart size is normal. Right IJ central venous line tip overlies the mid upper SVC. IMPRESSION: 1. No active lung disease. 2. Right IJ central venous line tip overlies the mid upper SVC Electronically Signed   By: Ivar Drape M.D.   On: 06/22/2017 11:29    Cardiac Studies   2D Echo: Left ventricle:  The cavity size was normal. Wall thickness was increased in a pattern of mild LVH. Systolic function was mildly to moderately reduced. The estimated ejection fraction was in the range of 40% to 45%. Diffuse hypokinesis. Doppler parameters are consistent with abnormal left ventricular relaxation (grade 1 diastolic dysfunction). Doppler parameters are consistent with high ventricular filling pressure.  ------------------------------------------------------------------- Aortic valve:  A bioprosthesis was present. Mobility was not restricted.  Doppler:  Transvalvular velocity was within the normal range. There was no stenosis. There  was no regurgitation.    VTI ratio of LVOT to aortic valve: 0.76. Peak velocity ratio of LVOT to aortic valve: 0.7. Mean velocity ratio of LVOT to aortic valve: 0.69.    Mean gradient (S): 13 mm Hg. Peak gradient (S): 21 mm Hg.   ------------------------------------------------------------------- Aorta:  Aortic  root: The aortic root was mildly dilated.  ------------------------------------------------------------------- Mitral valve:   Calcified annulus. Mobility was not restricted. Doppler:  Transvalvular velocity was within the normal range. There was no evidence for stenosis. There was trivial regurgitation. Peak gradient (D): 6 mm Hg.  ------------------------------------------------------------------- Left atrium:  The atrium was normal in size.  ------------------------------------------------------------------- Right ventricle:  The cavity size was normal. Systolic function was normal.  ------------------------------------------------------------------- Pulmonic valve:    Doppler:  Transvalvular velocity was within the normal range. There was no evidence for stenosis.  ------------------------------------------------------------------- Tricuspid valve:   Structurally normal valve.    Doppler: Transvalvular velocity was within the normal range. There was no regurgitation.  ------------------------------------------------------------------- Right atrium:  The atrium was normal in size.  ------------------------------------------------------------------- Pericardium:  There was no pericardial effusion.  ------------------------------------------------------------------- Systemic veins: Inferior vena cava: The vessel was mildly dilated.  ------------------------------------------------------------------- Measurements   Left ventricle                           Value        Reference  LV ID, ED, PLAX chordal                  46.2  mm     43 - 52  LV ID, ES, PLAX chordal          (H)     39    mm     23 - 38  LV fx shortening, PLAX chordal   (L)     16    %      >=29  LV PW thickness, ED                      13.5  mm     ----------  IVS/LV PW ratio, ED                      1.19         <=1.3  LV ejection fraction, 1-p A4C            35    %      ----------  LV  end-diastolic volume, 2-p             109   ml     ----------  LV end-systolic volume, 2-p              73    ml     ----------  LV ejection fraction, 2-p                33    %      ----------  Stroke volume, 2-p                       36    ml     ----------  LV end-diastolic volume/bsa, 2-p         53    ml/m^2 ----------  LV end-systolic volume/bsa, 2-p          36    ml/m^2 ----------  Stroke volume/bsa, 2-p  17.3  ml/m^2 ----------  LV e&', lateral                           12.1  cm/s   ----------  LV E/e&', lateral                         10.33        ----------  LV s&', lateral                           9.2   cm/s   ----------  LV e&', medial                            6.05  cm/s   ----------  LV E/e&', medial                          20.66        ----------  LV e&', average                           9.08  cm/s   ----------  LV E/e&', average                         13.77        ----------    Ventricular septum                       Value        Reference  IVS thickness, ED                        16.1  mm     ----------    LVOT                                     Value        Reference  LVOT peak velocity, S                    161   cm/s   ----------  LVOT mean velocity, S                    116   cm/s   ----------  LVOT VTI, S                              33.4  cm     ----------  LVOT peak gradient, S                    10    mm Hg  ----------    Aortic valve                             Value        Reference  Aortic valve peak velocity, S            230   cm/s   ----------  Aortic valve mean velocity, S  169   cm/s   ----------  Aortic valve VTI, S                      44.2  cm     ----------  Aortic mean gradient, S                  13    mm Hg  ----------  Aortic peak gradient, S                  21    mm Hg  ----------  VTI ratio, LVOT/AV                       0.76         ----------  Velocity ratio, peak, LVOT/AV            0.7           ----------  Velocity ratio, mean, LVOT/AV            0.69         ----------    Aorta                                    Value        Reference  Aortic root ID, ED                       42    mm     ----------  Ascending aorta ID, A-P, S               32    mm     ----------    Left atrium                              Value        Reference  LA ID, A-P, ES                           35    mm     ----------  LA ID/bsa, A-P                           1.7   cm/m^2 <=2.2  LA volume, S                             49.1  ml     ----------  LA volume/bsa, S                         23.9  ml/m^2 ----------  LA volume, ES, 1-p A4C                   37.2  ml     ----------  LA volume/bsa, ES, 1-p A4C               18.1  ml/m^2 ----------  LA volume, ES, 1-p A2C                   59.2  ml     ----------  LA volume/bsa, ES, 1-p A2C  28.8  ml/m^2 ----------    Mitral valve                             Value        Reference  Mitral E-wave peak velocity              125   cm/s   ----------  Mitral A-wave peak velocity              103   cm/s   ----------  Mitral deceleration time                 180   ms     150 - 230  Mitral peak gradient, D                  6     mm Hg  ----------  Mitral E/A ratio, peak                   1.1          ----------    Right atrium                             Value        Reference  RA ID, S-I, ES, A4C              (H)     57    mm     34 - 49  RA area, ES, A4C                         19    cm^2   8.3 - 19.5  RA volume, ES, A/L                       52.3  ml     ----------  RA volume/bsa, ES, A/L                   25.4  ml/m^2 ----------    Systemic veins                           Value        Reference  Estimated CVP                            3     mm Hg  ----------    Right ventricle                          Value        Reference  TAPSE                                    12.7  mm     ----------  RV s&', lateral, S                        10.3  cm/s    ----------   Patient Profile     83 y.o. male with severe low-flow low-gradient aortic stenosis, underwent TAVR 06/22/17  Assessment & Plan  1. Severe aortic stenosis: POD #2  from TAVR. Progressing well. ASA/Plavix. 2D echo reviewed and shows normal function of the aortic bioprosthesis without paravalvular regurgitation.  2. Acute on chronic systolic heart failure with progressive LV dysfunction. Some improvement of LV systolic dysfunction now with LVEF estimated at 45%. Continue coreg. Hold ACE/ARB with CKD and past intolerance. Resume demadex but take every other day. Should have FU BMET 1-2 weeks.  3. Type II DM: on glipizide and SSI. CBG's 103-136 - well-controlled.  4. CKD 3: creatinine 1.86 today essentially at baseline  5. Left knee pain likely gout: has responded well to Indocin in the past - explained this is not a good option for extended treatment with CKD. He reports a good response generally with only 1-2 doses. Will give once now.  Dispo: appears suitable for discharge today. Should have FU in 1-2 weeks with a BMET and his 30 day valve clinic appt/echo will be arranged. Looks like he has an appt already arranged in HF clinic 7/19. Please do BMET prior to that appt. thanks  Signed, Sherren Mocha, MD  06/24/2017, 7:46 AM

## 2017-06-25 ENCOUNTER — Telehealth (HOSPITAL_COMMUNITY): Payer: Self-pay

## 2017-06-25 LAB — TYPE AND SCREEN
ABO/RH(D): O POS
ANTIBODY SCREEN: NEGATIVE
UNIT DIVISION: 0
Unit division: 0

## 2017-06-25 LAB — BPAM RBC
BLOOD PRODUCT EXPIRATION DATE: 201807252359
Blood Product Expiration Date: 201807252359
ISSUE DATE / TIME: 201807030833
ISSUE DATE / TIME: 201807030833
UNIT TYPE AND RH: 5100
Unit Type and Rh: 5100

## 2017-06-25 NOTE — Telephone Encounter (Signed)
Patient insurance is active and benefits verified. Patient has [employer] - $15.00 co-pay, no deductible, out of pocket $3400/$1031.32 has been met, no co-insurance, no pre-authorization and no limit on visit. Spoke with Mickel Baas @ [employer] on 06/25/17 reference #[id number].  Patient will be contacted and scheduled after their follow up appointment with the cardiologist office on 07/15/17, upon review by [facility] RN navigator.

## 2017-06-29 MED FILL — Insulin Regular (Human) Inj 100 Unit/ML: INTRAMUSCULAR | Qty: 1 | Status: AC

## 2017-06-29 MED FILL — Sodium Chloride IV Soln 0.9%: INTRAVENOUS | Qty: 100 | Status: AC

## 2017-06-30 ENCOUNTER — Telehealth: Payer: Self-pay

## 2017-06-30 NOTE — Telephone Encounter (Signed)
I spoke with the pt in regards to rash after TAVR. The pt has red rash on left lower leg, below his knee. The pt denies itching at site of rash and he does not have any other signs of rash on his body.  The pt states that he has started to notice improvement in the rash over the past few days.  Per Dr Burt Knack at this time the pt should continue with observation.  The pt can take Benadryl if he develops itching at site. Pt agreed with plan.

## 2017-07-02 DIAGNOSIS — Z952 Presence of prosthetic heart valve: Secondary | ICD-10-CM

## 2017-07-02 HISTORY — DX: Presence of prosthetic heart valve: Z95.2

## 2017-07-07 NOTE — Progress Notes (Signed)
PCP: Dr. Felipa Eth Cardiology: Dr. Tamala Julian HF Cardiology: Dr. Aundra Dubin   83 yo with history of CAD s/p CABG, chronic systolic CHF, CKD stage 3, and suspected severe aortic stenosis presents for CHF clinic followup.  Patient had CABG in 2007.  In 10/17, he had an echo showing EF down to 20-25%.  With fall in EF, he ended up having a right and left heart cath done in 11/17.  The cath showed normal right and left heart filling pressures.  The LIMA-LAD and SVG-PDA were patent but the SVG-ramus was occluded.  The fall in EF was thought to be disproportionate to the coronary disease. Echo in 4/18 was concerning for low gradient severe AS.  This was confirmed in 5/18 by low dose DSE.   He has had a hard time getting on cardiac meds.  Hydralazine/nitrates were not tolerated due to hypotension.  He has not been on ACEI/ARB/ARNI due to elevated creatinine.  He did not tolerate spironolactone.  He was then on the combination of torsemide, eplerenone, and low dose Coreg.   He felt sleepy/over-sedated and creatinine rose, so he stopped both torsemide and eplerenone.  When I saw him initially, I put him back on torsemide 20 mg every other day.   He has seen Dr Burt Knack, and TAVR is planned tentatively depending on the outcome of his CT scans (still awaiting the coronary CT).       He is S/P TAVR 06/22/2017. ECHO EF 40-45%.   Today he returns for HF follow up. Complaining of day time fatigue. Complaining of intermittent dry cough.  Complaining of back pain. Complaining of day time fatigue. No chest pain. Weight at home 180 pounds. Weight has been going down. Denies N/V. No fever or chills .Appetite ok. Taking all medications.    Labs (5/18): K 4.7, creatinine 2.29 Labs (6/18): K 4.3, creatinine 1.84 Labs 06/24/2017: K 4.2 Creatinine 1.86   PMH: 1. Low back pain: Severe, from arthritis.  2. Gout 3. Hyperlipidemia: Myalgias with simvastatin and atorvastatin.  4. Prostate cancer s/p radiation.  5. Renal cell carcinoma  s/p left partial nephrectomy.  6. HTN 7. Type II diabetes 8. CAD: s/p CABG in 2007.  - LHC/RHC (11/17): Totally occluded left main and RCA. Totally occluded SVG-ramus.  Patent SVG-PDA and LIMA-LAD.  Mean RA 7, PA 21/4, mean PCWP 6, CI 2.72.  9. CKD: Stage 3.  10. Chronic systolic CHF: Ischemic cardiomyopathy, though severe AS may play a roll.  - Echo (10/17): EF 20-25%, aortic stenosis reported as mild.  - Echo (4/18): EF 25-30%, low gradient severe AS suspected, mild MR, RV function mildly reduced.  ECHO 06/23/2017: EF 40-45% Grade IDD. Diffuse HK. AV bioprosthesis present.  11. Aortic stenosis: Suspect low gradient severe AS.  - Low dose DSE (5/18): EF 20-25%, mean gradient across the aortic valve rose from 25 mmHg => 41 mmHg and AVA remained 0.8 cm^2.   12. Carotid dopplers (6/18) with mild bilateral carotid stenosis.  51. S/P TAVR 07/07/2017  SH: Lives with wife in [facility].  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  He has not smoked in > 30 years.   Family History  Problem Relation Age of Onset  . Heart Problems Mother   . Heart Problems Father    ROS: All systems reviewed and negative except as per HPI.   Current Outpatient Prescriptions  Medication Sig Dispense Refill  . alendronate (FOSAMAX) 70 MG tablet Take 70 mg by mouth once a week. Take with a  full glass of water on an empty stomach.    Marland Kitchen allopurinol (ZYLOPRIM) 300 MG tablet Take 300 mg by mouth daily.  2  . aspirin 81 MG chewable tablet Chew 81 mg by mouth every evening.     . Calcium Carbonate-Vitamin D 600-200 MG-UNIT TABS Take 1 tablet by mouth daily.    . carvedilol (COREG) 3.125 MG tablet TAKE 1 TABLET BY MOUTH TWICE DAILY 60 tablet 0  . clopidogrel (PLAVIX) 75 MG tablet Take 1 tablet (75 mg total) by mouth daily with breakfast. 90 tablet 1  . diclofenac sodium (VOLTAREN) 1 % GEL Apply 2 g topically daily as needed (pain).    Marland Kitchen glipiZIDE (GLUCOTROL) 10 MG tablet Take 5-10 mg by mouth 2 (two) times daily before a  meal. 10 mg in the morning and 5 mg in the evening  2  . HYDROcodone-acetaminophen (NORCO) 10-325 MG tablet Take 1-2 tablets by mouth every 6 (six) hours as needed for moderate pain. (Patient taking differently: Take 1-2 tablets by mouth 3 (three) times daily as needed for moderate pain. Depends on pain level if takes 1-2 tablets) 10 tablet 0  . Liniments (SALONPAS PAIN RELIEF PATCH EX) Apply 1 patch topically daily as needed (pain).    . methocarbamol (ROBAXIN) 500 MG tablet Take 500 mg by mouth as needed for muscle spasms.    . Multiple Vitamin (MULTIVITAMIN) tablet Take 1 tablet by mouth daily.    . rosuvastatin (CRESTOR) 10 MG tablet Take 10 mg by mouth daily.    . tamsulosin (FLOMAX) 0.4 MG CAPS capsule Take 0.4 mg by mouth daily after supper.    . torsemide (DEMADEX) 20 MG tablet Take 20 mg by mouth 3 (three) times a week. Mon-Wed-Fri     No current facility-administered medications for this encounter.    BP (!) 106/54   Pulse 77   Wt 182 lb 4 oz (82.7 kg)   SpO2 98%   BMI 28.98 kg/m   Filed Weights   07/08/17 1035  Weight: 182 lb 4 oz (82.7 kg)   General:  Well appearing. No resp difficulty. Ambulated in the clinic without difficulty HEENT: normal Neck: supple. JVP 6-7. Carotids 2+ bilat; no bruits. No lymphadenopathy or thryomegaly appreciated. Cor: PMI nondisplaced. Regular rate & rhythm. No rubs, gallops or murmurs. Lungs: clear Abdomen: soft, nontender, nondistended. No hepatosplenomegaly. No bruits or masses. Good bowel sounds. Extremities: no cyanosis, clubbing, rash, edema Neuro: alert & orientedx3, cranial nerves grossly intact. moves all 4 extremities w/o difficulty. Affect pleasant   Assessment/Plan: 1. CAD: s/p CABG.  Last cath in 11/17 with patent LIMA-LAD, patent SVG-PDA, and occluded SVG-ramus.  No intervention.  No S/S ischemia. Continue ASA and statin.    2. CKD: Stage 3.  Followed by Dr. Justin Mend. CTs are being spaced out to try to lessen renal risk. Check BMET  today.  3. Chronic systolic CHF: Ischemic and possibly valvular (low gradient severe AS) cardiomyopathy.  Most recent echo in 4/18 with EF 25-30% -NYHA II. Volume status stable. Continue low dose carvedilol will not increase with fatigue.  - Continue Coreg 3.125 mg bid. - Hold off on ACEI/ARB/ARNI with elevated creatinine.  - Hold off on hydralazine/Imdur for now, caused hypotension.  Hold off on eplerenone with CK.   4. Aortic stenosis: - S/P TAVR 06/22/2017. ECHO EF improved to 40-45% from 20-25%. 5. Hyperlipidemia: Continue Crestor.  6. Day Time Fatigue- Needs sleep study but he wants to hold off.   Follow up in 6  weeks with Dr Aundra Dubin.   Greater than 50% of the (total minutes 26) visit spent in counseling/coordination of care regarding heart fialure, diuretics, and sleep apnea.    I reviewed BMET. Creatinine higher. Hold torsemide and restart next Monday. He will then start torsemide Monday and Friday.    Etan Vasudevan NP-C  07/07/2017

## 2017-07-08 ENCOUNTER — Telehealth (HOSPITAL_COMMUNITY): Payer: Self-pay | Admitting: *Deleted

## 2017-07-08 ENCOUNTER — Ambulatory Visit (HOSPITAL_COMMUNITY)
Admission: RE | Admit: 2017-07-08 | Discharge: 2017-07-08 | Disposition: A | Discharge status: Home / Self Care | Payer: PPO | Source: Ambulatory Visit | Attending: Cardiology | Admitting: Cardiology

## 2017-07-08 VITALS — BP 106/54 | HR 77 | Wt 182.2 lb

## 2017-07-08 DIAGNOSIS — Z7984 Long term (current) use of oral hypoglycemic drugs: Secondary | ICD-10-CM | POA: Diagnosis not present

## 2017-07-08 DIAGNOSIS — Z905 Acquired absence of kidney: Secondary | ICD-10-CM | POA: Insufficient documentation

## 2017-07-08 DIAGNOSIS — E1122 Type 2 diabetes mellitus with diabetic chronic kidney disease: Secondary | ICD-10-CM | POA: Insufficient documentation

## 2017-07-08 DIAGNOSIS — G4730 Sleep apnea, unspecified: Secondary | ICD-10-CM | POA: Diagnosis not present

## 2017-07-08 DIAGNOSIS — I350 Nonrheumatic aortic (valve) stenosis: Secondary | ICD-10-CM | POA: Diagnosis not present

## 2017-07-08 DIAGNOSIS — Z8249 Family history of ischemic heart disease and other diseases of the circulatory system: Secondary | ICD-10-CM | POA: Diagnosis not present

## 2017-07-08 DIAGNOSIS — Z79899 Other long term (current) drug therapy: Secondary | ICD-10-CM | POA: Diagnosis not present

## 2017-07-08 DIAGNOSIS — I130 Hypertensive heart and chronic kidney disease with heart failure and stage 1 through stage 4 chronic kidney disease, or unspecified chronic kidney disease: Secondary | ICD-10-CM | POA: Diagnosis not present

## 2017-07-08 DIAGNOSIS — N189 Chronic kidney disease, unspecified: Secondary | ICD-10-CM

## 2017-07-08 DIAGNOSIS — E785 Hyperlipidemia, unspecified: Secondary | ICD-10-CM | POA: Insufficient documentation

## 2017-07-08 DIAGNOSIS — I25810 Atherosclerosis of coronary artery bypass graft(s) without angina pectoris: Secondary | ICD-10-CM | POA: Diagnosis not present

## 2017-07-08 DIAGNOSIS — Z87891 Personal history of nicotine dependence: Secondary | ICD-10-CM | POA: Insufficient documentation

## 2017-07-08 DIAGNOSIS — Z7982 Long term (current) use of aspirin: Secondary | ICD-10-CM | POA: Diagnosis not present

## 2017-07-08 DIAGNOSIS — I5022 Chronic systolic (congestive) heart failure: Secondary | ICD-10-CM

## 2017-07-08 DIAGNOSIS — Z8546 Personal history of malignant neoplasm of prostate: Secondary | ICD-10-CM | POA: Insufficient documentation

## 2017-07-08 DIAGNOSIS — Z952 Presence of prosthetic heart valve: Secondary | ICD-10-CM | POA: Diagnosis not present

## 2017-07-08 DIAGNOSIS — N183 Chronic kidney disease, stage 3 (moderate): Secondary | ICD-10-CM | POA: Insufficient documentation

## 2017-07-08 DIAGNOSIS — M109 Gout, unspecified: Secondary | ICD-10-CM | POA: Diagnosis not present

## 2017-07-08 LAB — BASIC METABOLIC PANEL
Anion gap: 10 (ref 5–15)
BUN: 30 mg/dL — ABNORMAL HIGH (ref 6–20)
CALCIUM: 10.6 mg/dL — AB (ref 8.9–10.3)
CO2: 28 mmol/L (ref 22–32)
CREATININE: 2.05 mg/dL — AB (ref 0.61–1.24)
Chloride: 102 mmol/L (ref 101–111)
GFR calc non Af Amer: 29 mL/min — ABNORMAL LOW (ref >60)
GFR, EST AFRICAN AMERICAN: 34 mL/min — AB (ref >60)
Glucose, Bld: 108 mg/dL — ABNORMAL HIGH (ref 65–99)
Potassium: 4.3 mmol/L (ref 3.5–5.1)
SODIUM: 140 mmol/L (ref 135–145)

## 2017-07-08 MED ORDER — TORSEMIDE 20 MG PO TABS: ORAL_TABLET | ORAL | 3 refills | Status: DC

## 2017-07-08 NOTE — Patient Instructions (Signed)
Follow up in 6 weeks with Dr Aundra Dubin  Do the following things EVERYDAY: 1) Weigh yourself in the morning before breakfast. Write it down and keep it in a log. 2) Take your medicines as prescribed 3) Eat low salt foods-Limit salt (sodium) to 2000 mg per day.  4) Stay as active as you can everyday 5) Limit all fluids for the day to less than 2 liters

## 2017-07-08 NOTE — Telephone Encounter (Signed)
-----   Message from Steven Carlsborg, NP sent at 07/08/2017  1:05 PM EDT ----- Please cut back torsemide to twice a week. Renal function elevated. Ask him to hold torsemide this Friday then start torsemide 20 mg every Monday and Friday. Repeat BMET in 1 week.

## 2017-07-15 ENCOUNTER — Other Ambulatory Visit: Payer: Self-pay

## 2017-07-15 ENCOUNTER — Ambulatory Visit (HOSPITAL_COMMUNITY): Payer: PPO | Attending: Cardiology

## 2017-07-15 ENCOUNTER — Ambulatory Visit (INDEPENDENT_AMBULATORY_CARE_PROVIDER_SITE_OTHER): Payer: PPO | Admitting: Cardiovascular Disease

## 2017-07-15 ENCOUNTER — Encounter: Payer: Self-pay | Admitting: Cardiovascular Disease

## 2017-07-15 ENCOUNTER — Other Ambulatory Visit (HOSPITAL_COMMUNITY): Payer: PPO

## 2017-07-15 VITALS — BP 122/68 | HR 70 | Ht 66.5 in | Wt 182.8 lb

## 2017-07-15 DIAGNOSIS — Z952 Presence of prosthetic heart valve: Secondary | ICD-10-CM | POA: Diagnosis not present

## 2017-07-15 DIAGNOSIS — I5022 Chronic systolic (congestive) heart failure: Secondary | ICD-10-CM

## 2017-07-15 DIAGNOSIS — R911 Solitary pulmonary nodule: Secondary | ICD-10-CM | POA: Diagnosis not present

## 2017-07-15 DIAGNOSIS — I350 Nonrheumatic aortic (valve) stenosis: Secondary | ICD-10-CM

## 2017-07-15 LAB — ECHOCARDIOGRAM COMPLETE
AV Peak grad: 19 mmHg
AV VEL mean LVOT/AV: 0.46
AV pk vel: 220 cm/s
AVG: 11 mmHg
Ao pk vel: 0.46 m/s
CHL CUP DOP CALC LVOT VTI: 21.9 cm
CHL CUP REG VEL DIAS: 122 cm/s
DOP CAL AO MEAN VELOCITY: 155 cm/s
EERAT: 5.03
EWDT: 169 ms
FS: 8 % — AB (ref 28–44)
IV/PV OW: 1.13
LA diam end sys: 42 mm
LA diam index: 2.18 cm/m2
LASIZE: 42 mm
LAVOL: 42.4 mL
LAVOLA4C: 50.6 mL
LAVOLIN: 22 mL/m2
LV E/e' medial: 5.03
LV E/e'average: 5.03
LV e' LATERAL: 11 cm/s
LVOT peak VTI: 0.46 cm
LVOT peak vel: 101 cm/s
Lateral S' vel: 7.3 cm/s
MV Dec: 169
MVPKAVEL: 99.8 m/s
MVPKEVEL: 55.3 m/s
PV Reg grad dias: 6 mmHg
PW: 9.5 mm — AB (ref 0.6–1.1)
RV TAPSE: 19.3 mm
TDI e' lateral: 11
TDI e' medial: 3.73
VTI: 47.4 cm

## 2017-07-15 MED ORDER — PERFLUTREN LIPID MICROSPHERE
1.0000 mL | INTRAVENOUS | Status: AC | PRN
  Administered 2017-07-15: 2 mL via INTRAVENOUS

## 2017-07-15 MED ORDER — CLINDAMYCIN HCL 300 MG PO CAPS
ORAL_CAPSULE | ORAL | 1 refills | Status: DC
Start: 2017-07-15 — End: 2021-01-14

## 2017-07-15 NOTE — Progress Notes (Signed)
Cardiology Office Note Date:  07/15/2017   ID:  [name], DOB [birth year], MRN [id number]  PCP:  Lajean Manes, MD  Cardiologist:  Sherren Mocha, MD    Chief Complaint  Patient presents with  . Aortic Stenosis    s/p TAVR     History of Present Illness: [name] is a 83 y.o. male who presents for 30 day TAVR follow-up evaluation. The patient has a history of coronary artery disease and multivessel CABG in 2007. He developed progressive heart failure and LV dysfunction and was found to have severe low flow low gradient aortic stenosis based on dobutamine echo assessment. He ultimately was treated with TAVR on 06/22/2017 via a percutaneous transfemoral approach. A 29 mm Sapien 3 transcatheter heart valve was used.   The patient has been unable to tolerate an aggressive heart failure regimen because of hypotension and worsening renal function with different medications. He is here with his wife today. He's doing well since TAVR. He has reduced his torsemide to 20 mg twice weekly. He has no edema at present. His breathing and energy level are both improved. He still experiences shortness of breath with activity and fatigue but clearly states he is better than he was prior to TAVR. He's had no recent chest pain or pressure.   Past Medical History:  Diagnosis Date  . Chondromalacia   . Chronic systolic CHF (congestive heart failure) ([place])   . Coronary artery disease    a. 2007: s/p CABG x3V (LIMA--> LAD, SVG--> PDA, SVG--> ramus)  c. 2017: LHC with 2/3 patent bypass grafts with occuluded SVG--> Ramus   . Diabetes mellitus ([place])   . GERD (gastroesophageal reflux disease)   . Glaucoma    "high normal"  . Gout   . H/O blood clots    R Lower leg, right upper leg  . History of renal cell cancer   . Hyperlipidemia   . Hypertension   . Joint pain   . Low back pain    "severe"  . Obesity   . Osteoporosis   . Polymyalgia ([place])    h/o  . Prostate cancer ([place])   .  Severe aortic stenosis    a. 06/2017: s/p TAVR  . Spinal stenosis   . Thrombophlebitis   . Tubular adenoma     Past Surgical History:  Procedure Laterality Date  . APPENDECTOMY  60 years ago  . BACK SURGERY    . CARDIAC CATHETERIZATION    . CARDIAC CATHETERIZATION N/A 11/03/2016   Procedure: Right/Left Heart Cath and Coronary/Graft Angiography;  Surgeon: Belva Crome, MD;  Location: [facility];  Service: Cardiovascular;  Laterality: N/A;  . CORONARY ARTERY BYPASS GRAFT  2007  . EYE SURGERY Right    /w IOL, post cataracts   . PROSTATE SURGERY     seed implant  . ROBOTIC ASSITED PARTIAL NEPHRECTOMY Left 08/31/2013   Procedure: ROBOTIC ASSITED PARTIAL NEPHRECTOMY;  Surgeon: Dutch Gray, MD;  Location: [facility];  Service: Urology;  Laterality: Left;  . TEE WITHOUT CARDIOVERSION N/A 06/22/2017   Procedure: TRANSESOPHAGEAL ECHOCARDIOGRAM (TEE);  Surgeon: Sherren Mocha, MD;  Location: [facility];  Service: Open Heart Surgery;  Laterality: N/A;  . TRANSCATHETER AORTIC VALVE REPLACEMENT, TRANSFEMORAL N/A 06/22/2017   Procedure: TRANSCATHETER AORTIC VALVE REPLACEMENT, TRANSFEMORAL;  Surgeon: Sherren Mocha, MD;  Location: [facility];  Service: Open Heart Surgery;  Laterality: N/A;  . VEIN LIGATION AND STRIPPING Right 1972   right lower leg  Current Outpatient Prescriptions  Medication Sig Dispense Refill  . alendronate (FOSAMAX) 70 MG tablet Take 70 mg by mouth once a week. Take with a full glass of water on an empty stomach.    Marland Kitchen allopurinol (ZYLOPRIM) 300 MG tablet Take 300 mg by mouth daily.  2  . aspirin 81 MG chewable tablet Chew 81 mg by mouth every evening.     . Calcium Carbonate-Vitamin D 600-200 MG-UNIT TABS Take 1 tablet by mouth daily.    . carvedilol (COREG) 3.125 MG tablet TAKE 1 TABLET BY MOUTH TWICE DAILY 60 tablet 0  . clopidogrel (PLAVIX) 75 MG tablet Take 1 tablet (75 mg total) by mouth daily with breakfast. 90 tablet 1  . diclofenac sodium (VOLTAREN) 1 % GEL Apply 2 g  topically daily as needed (pain).    Marland Kitchen glipiZIDE (GLUCOTROL) 10 MG tablet Take 5-10 mg by mouth 2 (two) times daily before a meal. 10 mg in the morning and 5 mg in the evening  2  . HYDROcodone-acetaminophen (NORCO) 10-325 MG tablet Take 1-2 tablets by mouth every 6 (six) hours as needed (pain).    . Liniments (SALONPAS PAIN RELIEF PATCH EX) Apply 1 patch topically daily as needed (pain).    . methocarbamol (ROBAXIN) 500 MG tablet Take 500 mg by mouth as needed for muscle spasms.    . Multiple Vitamin (MULTIVITAMIN) tablet Take 1 tablet by mouth daily.    . rosuvastatin (CRESTOR) 10 MG tablet Take 10 mg by mouth daily.    . tamsulosin (FLOMAX) 0.4 MG CAPS capsule Take 0.4 mg by mouth daily after supper.    . torsemide (DEMADEX) 20 MG tablet Take 20 mg by mouth 2 (two) times a week. On Monday and Friday    . clindamycin (CLEOCIN) 300 MG capsule Take 2 capsules by mouth one hour prior to dental appointment 6 capsule 1   No current facility-administered medications for this visit.     Allergies:   Fish allergy; Lipitor [atorvastatin]; Penicillins; Simvastatin; Adacel [diphth-acell pertussis-tetanus]; Lasix [furosemide]; Septra [sulfamethoxazole-trimethoprim]; Flexeril [cyclobenzaprine]; and Pantoprazole   Social History:  The patient  reports that he quit smoking about 36 years ago. His smoking use included Cigarettes. He has a 90.00 pack-year smoking history. He has never used smokeless tobacco. He reports that he does not drink alcohol or use drugs.   Family History:  The patient's  family history includes Heart Problems in his father and mother.    ROS:  Please see the history of present illness.  Otherwise, review of systems is positive for cough, rash, constipation.  All other systems are reviewed and negative.    PHYSICAL EXAM: VS:  BP 122/68   Pulse 70   Ht 5' 6.5" (1.689 m)   Wt 182 lb 12.8 oz (82.9 kg)   BMI 29.06 kg/m  , BMI Body mass index is 29.06 kg/m. GEN: Well nourished,  well developed, in no acute distress  HEENT: normal  Neck: no JVD, no masses. No carotid bruits Cardiac: RRR without murmur or gallop                Respiratory:  clear to auscultation bilaterally, normal work of breathing GI: soft, nontender, nondistended, + BS MS: no deformity or atrophy  Ext: no pretibial edema, pedal pulses 2+= bilaterally Skin: warm and dry, no rash Neuro:  Strength and sensation are intact Psych: euthymic mood, full affect  EKG:  EKG is ordered today. The ekg ordered today shows normal sinus rhythm 70  bpm, left bundle branch block with QRS duration 188 ms  Recent Labs: 01/25/2017: NT-Pro BNP 1,209 06/09/2017: B Natriuretic Peptide 175.4 06/17/2017: ALT <5 06/23/2017: Hemoglobin 9.7; Magnesium 2.2; Platelets 162 07/08/2017: BUN 30; Creatinine, Ser 2.05; Potassium 4.3; Sodium 140   Lipid Panel  No results found for: CHOL, TRIG, HDL, CHOLHDL, VLDL, LDLCALC, LDLDIRECT    Wt Readings from Last 3 Encounters:  07/15/17 182 lb 12.8 oz (82.9 kg)  07/08/17 182 lb 4 oz (82.7 kg)  06/24/17 206 lb 1.6 oz (93.5 kg)     Cardiac Studies Reviewed: 2D Echo 07/15/2017: Study Conclusions  - Procedure narrative: Transthoracic echocardiography. Image   quality was adequate. Intravenous contrast (Definity) was   administered. - Left ventricle: The cavity size was severely dilated. Systolic   function was severely reduced. The estimated ejection fraction   was 30%. Severe diffuse hypokinesis. There was an increased   relative contribution of atrial contraction to ventricular   filling. Doppler parameters are consistent with abnormal left   ventricular relaxation (grade 1 diastolic dysfunction). - Ventricular septum: Septal motion showed paradox. These changes   are consistent with intraventricular conduction delay. - Aortic valve: S/P TAVR with stable bioprosthetic AVR. The mean AV   gradient is 69mmHg and Peak AV gradient is86mmHg. There is no   evidence of perivalvular  leak. - Mitral valve: Calcified annulus with thickened and calcified   chordae tendinae There was mild regurgitation. - Pulmonic valve: There was trivial regurgitation.  ASSESSMENT AND PLAN: 1.  Aortic valve disease status post TAVR: The patient's echo is reviewed and demonstrates normal function of his transcatheter heart valve with a mean gradient of 11 mmHg and no paravalvular regurgitation. He understands to follow SBE prophylaxis. He will continue aspirin and Plavix for at least 6 months.  2. Chronic systolic heart failure, New York Heart Association functional class IIB symptoms. I personally reviewed the patients echo study today. His LV appears dyssynchronous. Preoperatively the patient had a QRS duration of about 120 ms and he now has a complete left bundle branch block with QRS 188 ms. He is intolerant to multiple heart failure medications including ACE inhibitor is, ARB's, hydralazine, and Imdur. It might be reasonable to consider cardiac resynchronization. He will discuss with Dr. Aundra Dubin and Dr. Tamala Julian at follow-up.  3. Chronic kidney disease stage III: Followed by Dr. Justin Mend.  Current medicines are reviewed with the patient today.  The patient does not have concerns regarding medicines.  Labs/ tests ordered today include:   Orders Placed This Encounter  Procedures  . CT Chest Wo Contrast  . EKG 12-Lead    Disposition:   FU one year. Keep FU with Dr Aundra Dubin and Dr Tamala Julian as planned  Signed, Sherren Mocha, MD  07/15/2017 5:39 PM    [facility] [address], [place], [place]  [address] Phone: [phone]; Fax: [phone]

## 2017-07-15 NOTE — Progress Notes (Signed)
Yes, looks like CRT would benefit him based on ECG and echo.  I think he needs to see EP.  I would be glad to see him in office to discuss or could go straight to EP.

## 2017-07-15 NOTE — Patient Instructions (Addendum)
Medication Instructions:  Your physician recommends that you continue on your current medications as directed. Please refer to the Current Medication list given to you today.  Labwork: No new orders.   Testing/Procedures: Your physician has requested that you have an echocardiogram in 1 YEAR. Echocardiography is a painless test that uses sound waves to create images of your heart. It provides your doctor with information about the size and shape of your heart and how well your heart's chambers and valves are working. This procedure takes approximately one hour. There are no restrictions for this procedure.  Non-Cardiac CT scanning, (CAT scanning), is a noninvasive, special x-ray that produces cross-sectional images of the body using x-rays and a computer. CT scans help physicians diagnose and treat medical conditions. For some CT exams, a contrast material is used to enhance visibility in the area of the body being studied. CT scans provide greater clarity and reveal more details than regular x-ray exams. (CT Chest without Contrast due in December)  Follow-Up: Your physician recommends that you keep your scheduled follow-up appointment with Dr Aundra Dubin.   Your physician recommends that you schedule a follow-up appointment in: October with Dr Tamala Julian  Your physician wants you to follow-up in: 1 YEAR with Dr Burt Knack. You will receive a reminder letter in the mail two months in advance. If you don't receive a letter, please call our office to schedule the follow-up appointment.   Any Other Special Instructions Will Be Listed Below (If Applicable).  Your physician discussed the importance of taking an antibiotic prior to any dental, gastrointestinal, genitourinary procedures to prevent damage to the heart valves from infection. You were given a prescription for an antibiotic based on current SBE prophylaxis guidelines.   If you need a refill on your cardiac medications before your next appointment,  please call your pharmacy.

## 2017-07-20 ENCOUNTER — Other Ambulatory Visit: Payer: Self-pay | Admitting: *Deleted

## 2017-07-20 NOTE — Patient Outreach (Signed)
[facility] Screening call, unsuccessful but left a message for a return call. If I do not hear back from the member I will try again within the week.  Tanja Gift C. Joahan Swatzell, MSN, GNP-BC Gerontological Nurse Practitioner [facility] Care Management [phone]  

## 2017-07-21 DIAGNOSIS — D1801 Hemangioma of skin and subcutaneous tissue: Secondary | ICD-10-CM | POA: Diagnosis not present

## 2017-07-21 DIAGNOSIS — L570 Actinic keratosis: Secondary | ICD-10-CM | POA: Diagnosis not present

## 2017-07-21 DIAGNOSIS — Z85828 Personal history of other malignant neoplasm of skin: Secondary | ICD-10-CM | POA: Diagnosis not present

## 2017-07-21 DIAGNOSIS — L814 Other melanin hyperpigmentation: Secondary | ICD-10-CM | POA: Diagnosis not present

## 2017-07-21 DIAGNOSIS — Z85820 Personal history of malignant melanoma of skin: Secondary | ICD-10-CM | POA: Diagnosis not present

## 2017-07-21 DIAGNOSIS — L821 Other seborrheic keratosis: Secondary | ICD-10-CM | POA: Diagnosis not present

## 2017-07-21 DIAGNOSIS — D692 Other nonthrombocytopenic purpura: Secondary | ICD-10-CM | POA: Diagnosis not present

## 2017-07-22 ENCOUNTER — Encounter: Payer: Self-pay | Admitting: *Deleted

## 2017-07-22 ENCOUNTER — Other Ambulatory Visit: Payer: Self-pay | Admitting: *Deleted

## 2017-07-22 NOTE — Patient Outreach (Signed)
[facility] Screen. I spoke to [name] briefly on the phone. He advised me he and his wife are doing well and do not lack any medical resources. I advised him I will send him information about our services for their future reference.  [name]. Myrtie Neither, MSN, [facility] Gerontological Nurse Practitioner [facility] Care Management [phone]

## 2017-08-06 DIAGNOSIS — N189 Chronic kidney disease, unspecified: Secondary | ICD-10-CM | POA: Diagnosis not present

## 2017-08-06 DIAGNOSIS — M353 Polymyalgia rheumatica: Secondary | ICD-10-CM | POA: Diagnosis not present

## 2017-08-06 DIAGNOSIS — M1990 Unspecified osteoarthritis, unspecified site: Secondary | ICD-10-CM | POA: Diagnosis not present

## 2017-08-06 DIAGNOSIS — M549 Dorsalgia, unspecified: Secondary | ICD-10-CM | POA: Diagnosis not present

## 2017-08-06 DIAGNOSIS — M8580 Other specified disorders of bone density and structure, unspecified site: Secondary | ICD-10-CM | POA: Diagnosis not present

## 2017-08-20 ENCOUNTER — Ambulatory Visit (HOSPITAL_COMMUNITY)
Admission: RE | Admit: 2017-08-20 | Discharge: 2017-08-20 | Disposition: A | Discharge status: Home / Self Care | Payer: PPO | Source: Ambulatory Visit | Attending: Cardiology | Admitting: Cardiology

## 2017-08-20 ENCOUNTER — Encounter (HOSPITAL_COMMUNITY): Payer: Self-pay | Admitting: Cardiology

## 2017-08-20 VITALS — BP 132/65 | HR 73 | Wt 185.2 lb

## 2017-08-20 DIAGNOSIS — Z7984 Long term (current) use of oral hypoglycemic drugs: Secondary | ICD-10-CM | POA: Diagnosis not present

## 2017-08-20 DIAGNOSIS — I255 Ischemic cardiomyopathy: Secondary | ICD-10-CM | POA: Insufficient documentation

## 2017-08-20 DIAGNOSIS — Z905 Acquired absence of kidney: Secondary | ICD-10-CM | POA: Diagnosis not present

## 2017-08-20 DIAGNOSIS — I447 Left bundle-branch block, unspecified: Secondary | ICD-10-CM | POA: Diagnosis not present

## 2017-08-20 DIAGNOSIS — Z953 Presence of xenogenic heart valve: Secondary | ICD-10-CM | POA: Diagnosis not present

## 2017-08-20 DIAGNOSIS — Z951 Presence of aortocoronary bypass graft: Secondary | ICD-10-CM | POA: Diagnosis not present

## 2017-08-20 DIAGNOSIS — Z7982 Long term (current) use of aspirin: Secondary | ICD-10-CM | POA: Diagnosis not present

## 2017-08-20 DIAGNOSIS — M109 Gout, unspecified: Secondary | ICD-10-CM | POA: Insufficient documentation

## 2017-08-20 DIAGNOSIS — Z952 Presence of prosthetic heart valve: Secondary | ICD-10-CM | POA: Diagnosis not present

## 2017-08-20 DIAGNOSIS — I130 Hypertensive heart and chronic kidney disease with heart failure and stage 1 through stage 4 chronic kidney disease, or unspecified chronic kidney disease: Secondary | ICD-10-CM | POA: Insufficient documentation

## 2017-08-20 DIAGNOSIS — R5383 Other fatigue: Secondary | ICD-10-CM | POA: Insufficient documentation

## 2017-08-20 DIAGNOSIS — Z79899 Other long term (current) drug therapy: Secondary | ICD-10-CM | POA: Insufficient documentation

## 2017-08-20 DIAGNOSIS — Z87891 Personal history of nicotine dependence: Secondary | ICD-10-CM | POA: Diagnosis not present

## 2017-08-20 DIAGNOSIS — N183 Chronic kidney disease, stage 3 (moderate): Secondary | ICD-10-CM | POA: Diagnosis not present

## 2017-08-20 DIAGNOSIS — I25810 Atherosclerosis of coronary artery bypass graft(s) without angina pectoris: Secondary | ICD-10-CM | POA: Insufficient documentation

## 2017-08-20 DIAGNOSIS — Z7952 Long term (current) use of systemic steroids: Secondary | ICD-10-CM | POA: Diagnosis not present

## 2017-08-20 DIAGNOSIS — E1122 Type 2 diabetes mellitus with diabetic chronic kidney disease: Secondary | ICD-10-CM | POA: Diagnosis not present

## 2017-08-20 DIAGNOSIS — I5022 Chronic systolic (congestive) heart failure: Secondary | ICD-10-CM

## 2017-08-20 DIAGNOSIS — Z7902 Long term (current) use of antithrombotics/antiplatelets: Secondary | ICD-10-CM | POA: Insufficient documentation

## 2017-08-20 DIAGNOSIS — E785 Hyperlipidemia, unspecified: Secondary | ICD-10-CM | POA: Diagnosis not present

## 2017-08-20 DIAGNOSIS — Z8546 Personal history of malignant neoplasm of prostate: Secondary | ICD-10-CM | POA: Insufficient documentation

## 2017-08-20 DIAGNOSIS — I2510 Atherosclerotic heart disease of native coronary artery without angina pectoris: Secondary | ICD-10-CM | POA: Diagnosis present

## 2017-08-20 DIAGNOSIS — I350 Nonrheumatic aortic (valve) stenosis: Secondary | ICD-10-CM | POA: Diagnosis not present

## 2017-08-20 LAB — BASIC METABOLIC PANEL
ANION GAP: 4 — AB (ref 5–15)
BUN: 34 mg/dL — ABNORMAL HIGH (ref 6–20)
CO2: 30 mmol/L (ref 22–32)
Calcium: 10.2 mg/dL (ref 8.9–10.3)
Chloride: 105 mmol/L (ref 101–111)
Creatinine, Ser: 1.8 mg/dL — ABNORMAL HIGH (ref 0.61–1.24)
GFR calc Af Amer: 39 mL/min — ABNORMAL LOW (ref >60)
GFR calc non Af Amer: 34 mL/min — ABNORMAL LOW (ref >60)
GLUCOSE: 216 mg/dL — AB (ref 65–99)
POTASSIUM: 5.1 mmol/L (ref 3.5–5.1)
Sodium: 139 mmol/L (ref 135–145)

## 2017-08-20 MED ORDER — CARVEDILOL 6.25 MG PO TABS: 6.2500 mg | ORAL_TABLET | Freq: Two times a day (BID) | ORAL | 6 refills | Status: DC

## 2017-08-20 NOTE — Patient Instructions (Addendum)
INCREASE Carvedilol (Coreg) to 6.25 mg twice daily. Can double up on your current 3.125 mg tablets (Take 2 tabs twice daily). New Rx has been sent to your pharmacy for 6.25 mg tablets (Take 1 tab twice daily).  Routine lab work today. Will notify you of abnormal results, otherwise no news is good news!  Follow up 6-8 weeks with Dr. Aundra Dubin. Take all medication as prescribed the day of your appointment. Bring all medications with you to your appointment.  Do the following things EVERYDAY: 1) Weigh yourself in the morning before breakfast. Write it down and keep it in a log. 2) Take your medicines as prescribed 3) Eat low salt foods-Limit salt (sodium) to 2000 mg per day.  4) Stay as active as you can everyday 5) Limit all fluids for the day to less than 2 liters

## 2017-08-20 NOTE — Progress Notes (Signed)
PCP: Dr. Felipa Eth Cardiology: Dr. Tamala Julian HF Cardiology: Dr. Aundra Dubin   83 yo with history of CAD s/p CABG, chronic systolic CHF, CKD stage 3, and suspected severe aortic stenosis presents for CHF clinic followup.  Patient had CABG in 2007.  In 10/17, he had an echo showing EF down to 20-25%.  With fall in EF, he ended up having a right and left heart cath done in 11/17.  The cath showed normal right and left heart filling pressures.  The LIMA-LAD and SVG-PDA were patent but the SVG-ramus was occluded.  The fall in EF was thought to be disproportionate to the coronary disease. Echo in 4/18 was concerning for low gradient severe AS.  This was confirmed in 5/18 by low dose DSE.   He has had a hard time getting on cardiac meds.  Hydralazine/nitrates were not tolerated due to hypotension.  He has not been on ACEI/ARB/ARNI due to elevated creatinine.  He did not tolerate spironolactone.  He was then on the combination of torsemide, eplerenone, and low dose Coreg.   He felt sleepy/over-sedated and creatinine rose, so he stopped both torsemide and eplerenone.  When seen initially, he was put back on torsemide 20 mg every other day, now he is taking it twice a week.   He is S/P TAVR 06/22/2017.  Echo in 7/18 showed EF 30% with severe LV dilation, prominent dyssynchrony, s/p TAVR with mean gradient 11 mmHg and no AI.  In 7/18 post-TAVR, QRS was noted to widen to LBBB at 188 msec => suspect this led to the dyssynchrony seen on 7/18 echo.   Today, ECG shows QRS more narrow at 132 msec.  He feels like he is breathing much better since TAVR.  He is able to do all ADLs without problems.  No dyspnea walking on flat ground.  No orthopnea/PND. No chest pain.  Main complaint is low back pain.   - EKG 07/15/17 (personally reviewed): Sinus rhythm 70 bpm, LBBB QRS 188 - EKC 8/18 (personally reviewed): NSR, IVCD 132 msec.     Labs (5/18): K 4.7, creatinine 2.29 Labs (6/18): K 4.3, creatinine 1.84 Labs (7/18): K 4.3, creatinine  2.05  PMH: 1. Low back pain: Severe, from arthritis.  2. Gout 3. Hyperlipidemia: Myalgias with simvastatin and atorvastatin.  4. Prostate cancer s/p radiation.  5. Renal cell carcinoma s/p left partial nephrectomy.  6. HTN 7. Type II diabetes 8. CAD: s/p CABG in 2007.  - LHC/RHC (11/17): Totally occluded left main and RCA. Totally occluded SVG-ramus.  Patent SVG-PDA and LIMA-LAD.  Mean RA 7, PA 21/4, mean PCWP 6, CI 2.72.  9. CKD: Stage 3.  10. Chronic systolic CHF: Ischemic cardiomyopathy, though severe AS may play a roll. Gynecomastia with spironolactone.  - Echo (10/17): EF 20-25%, aortic stenosis reported as mild.  - Echo (4/18): EF 25-30%, low gradient severe AS suspected, mild MR, RV function mildly reduced.  - Echo (7/18) with EF 30%, severe LV dilation, septal-lateral dyssynchrony, s/p TAVR with mean gradient 11 mmHg, no AI, mild MR.   11. Aortic stenosis: Suspect low gradient severe AS.  - Low dose DSE (5/18): EF 20-25%, mean gradient across the aortic valve rose from 25 mmHg => 41 mmHg and AVA remained 0.8 cm^2.   - TAVR (7/18) with 29 mm Edwards Sapien 3 valve.  12. Carotid dopplers (6/18) with mild bilateral carotid stenosis.   SH: Lives with wife in [facility].  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  He has not smoked  in > 30 years.   ROS: All systems reviewed and negative except as per HPI.   Family History  Problem Relation Age of Onset  . Heart Problems Mother   . Heart Problems Father    Review of systems complete and found to be negative unless listed in HPI.    Current Outpatient Prescriptions  Medication Sig Dispense Refill  . alendronate (FOSAMAX) 70 MG tablet Take 70 mg by mouth once a week. Take with a full glass of water on an empty stomach.    Marland Kitchen allopurinol (ZYLOPRIM) 300 MG tablet Take 300 mg by mouth daily.  2  . aspirin 81 MG chewable tablet Chew 81 mg by mouth every evening.     . Calcium Carbonate-Vitamin D 600-200 MG-UNIT TABS Take 1 tablet  by mouth daily.    . carvedilol (COREG) 3.125 MG tablet TAKE 1 TABLET BY MOUTH TWICE DAILY 60 tablet 0  . clindamycin (CLEOCIN) 300 MG capsule Take 2 capsules by mouth one hour prior to dental appointment 6 capsule 1  . clopidogrel (PLAVIX) 75 MG tablet Take 1 tablet (75 mg total) by mouth daily with breakfast. 90 tablet 1  . diclofenac sodium (VOLTAREN) 1 % GEL Apply 2 g topically daily as needed (pain).    Marland Kitchen glipiZIDE (GLUCOTROL) 10 MG tablet Take 5-10 mg by mouth 2 (two) times daily before a meal. 10 mg in the morning and 5 mg in the evening  2  . HYDROcodone-acetaminophen (NORCO) 10-325 MG tablet Take 1-2 tablets by mouth every 6 (six) hours as needed (pain).    . Liniments (SALONPAS PAIN RELIEF PATCH EX) Apply 1 patch topically daily as needed (pain).    . methocarbamol (ROBAXIN) 500 MG tablet Take 500 mg by mouth as needed for muscle spasms.    . Multiple Vitamin (MULTIVITAMIN) tablet Take 1 tablet by mouth daily.    . predniSONE (DELTASONE) 10 MG tablet Take 10 mg by mouth daily with breakfast.    . rosuvastatin (CRESTOR) 10 MG tablet Take 10 mg by mouth daily.    . tamsulosin (FLOMAX) 0.4 MG CAPS capsule Take 0.4 mg by mouth daily after supper.    . torsemide (DEMADEX) 20 MG tablet Take 20 mg by mouth 2 (two) times a week. On Monday and Friday     No current facility-administered medications for this encounter.    Vitals:   08/20/17 1442  BP: 132/65  Pulse: 73  SpO2: 98%  Weight: 185 lb 4 oz (84 kg)   Wt Readings from Last 3 Encounters:  08/20/17 185 lb 4 oz (84 kg)  07/15/17 182 lb 12.8 oz (82.9 kg)  07/08/17 182 lb 4 oz (82.7 kg)   General: Well appearing. No resp difficulty. HEENT: Normal Neck: Supple. JVP 6-7. Carotids 2+ bilat; no bruits. No thyromegaly or nodule noted. Cor: PMI nondisplaced. RRR, 2/6 early SEM RUSB.  Lungs: CTAB, normal effort. Abdomen: Soft, non-tender, non-distended, no HSM. No bruits or masses. +BS  Extremities: No cyanosis, clubbing, rash, R and  LLE no edema.  Neuro: Alert & orientedx3, cranial nerves grossly intact. moves all 4 extremities w/o difficulty. Affect pleasant   Assessment/Plan: 1. CAD: s/p CABG.  Last cath in 11/17 with patent LIMA-LAD, patent SVG-PDA, and occluded SVG-ramus.  No intervention.  No chest pain.  - Continue ASA and statin.     2. CKD: Stage 3.  Followed by Dr. Justin Mend. - BMET today.   3. Chronic systolic CHF: Ischemic and possibly valvular (low gradient severe  AS) cardiomyopathy.  He had TAVR in 7/18.  Echo in 7/18 after TAVR showed EF 30% with septal-lateral dyssynchrony.  When last echo was done, ECG showed QRS 188 msec, LBBB.  Today, ECG showed QRS 132 msec with IVCD.  Suspect inflammation/swelling peri-TAVR drove the worsening LBBB, now improved again.  It is possible that EF may be improved with resolution of wide LBBB.  NYHA class II symptoms, not volume overloaded on exam.  - Increase Coreg to 6.25 mg bid. - Hold off on ACEI/ARB/ARNI with elevated creatinine.  - Hold off on hydralazine/Imdur for now, caused hypotension.  - Hold off on eplerenone with CKD.  Spironolactone caused gynecomastia.  - He will need a repeat echo after next appointment to look for EF improvement with improvement in LBBB. Given age, would not recommend ICD.  4. Aortic stenosis: s/p TAVR with Edwards Sapien 3 bioprosthesis.  5. Hyperlipidemia: Continue crestor.  6. Day Time Fatigue: Needs sleep study but has refused scheduling thus far.   Followup in 6 wks.   Loralie Champagne 08/23/2017

## 2017-08-24 ENCOUNTER — Telehealth (HOSPITAL_COMMUNITY): Payer: Self-pay | Admitting: *Deleted

## 2017-08-24 NOTE — Telephone Encounter (Signed)
-----   Message from Larey Dresser, MD sent at 08/24/2017  2:45 PM EDT ----- Regarding: RE: Readiness to participate in Cardiac Rehab yes ----- Message ----- From: Rowe Pavy, RN Sent: 08/24/2017   1:10 PM To: Larey Dresser, MD Subject: Readiness to participate in Cardiac Rehab      Dr. Aundra Dubin,  The above patient referred to cardiac rehab s/p 06/22/17 TAVR.  Pt seen by you on 8/31.  Office note reviewed, unclear if pt is ready to participate in Cardiac rehab. In previous follow up notes, there was a mention of cardiac resynchronization.  Based upon your assessment, is pt ready to participate in cardiac rehab?  Thank you for your input Maurice Small RN, BSN Cardiac and Pulmonary Rehab Nurse Navigator

## 2017-08-25 ENCOUNTER — Telehealth (HOSPITAL_COMMUNITY): Payer: Self-pay

## 2017-08-25 NOTE — Telephone Encounter (Signed)
Notes recorded by Shirley Muscat, RN on 08/25/2017 at 10:41 AM EDT Pt is aware and agreeable to stop K supplements  ------  Notes recorded by Harvie Junior, CMA on 08/24/2017 at 2:55 PM EDT No answer/no voicemail will try patient again tomorrow ------  Notes recorded by Larey Dresser, MD on 08/23/2017 at 10:23 AM EDT Low K diet, stop any K supplement.

## 2017-08-26 ENCOUNTER — Telehealth (HOSPITAL_COMMUNITY): Payer: Self-pay

## 2017-08-26 NOTE — Telephone Encounter (Signed)
I called and spoke to patient about scheduling for cardiac rehab. Patient declined. Patient is currently having problems with his back. Referral closed.

## 2017-09-16 DIAGNOSIS — M47816 Spondylosis without myelopathy or radiculopathy, lumbar region: Secondary | ICD-10-CM | POA: Diagnosis not present

## 2017-09-21 DIAGNOSIS — M8588 Other specified disorders of bone density and structure, other site: Secondary | ICD-10-CM | POA: Diagnosis not present

## 2017-09-21 DIAGNOSIS — E1142 Type 2 diabetes mellitus with diabetic polyneuropathy: Secondary | ICD-10-CM | POA: Diagnosis not present

## 2017-09-21 DIAGNOSIS — E7800 Pure hypercholesterolemia, unspecified: Secondary | ICD-10-CM | POA: Diagnosis not present

## 2017-09-21 DIAGNOSIS — Z0000 Encounter for general adult medical examination without abnormal findings: Secondary | ICD-10-CM | POA: Diagnosis not present

## 2017-09-21 DIAGNOSIS — Z6830 Body mass index (BMI) 30.0-30.9, adult: Secondary | ICD-10-CM | POA: Diagnosis not present

## 2017-09-21 DIAGNOSIS — E1121 Type 2 diabetes mellitus with diabetic nephropathy: Secondary | ICD-10-CM | POA: Diagnosis not present

## 2017-09-21 DIAGNOSIS — R202 Paresthesia of skin: Secondary | ICD-10-CM | POA: Diagnosis not present

## 2017-09-21 DIAGNOSIS — E669 Obesity, unspecified: Secondary | ICD-10-CM | POA: Diagnosis not present

## 2017-09-21 DIAGNOSIS — Z79899 Other long term (current) drug therapy: Secondary | ICD-10-CM | POA: Diagnosis not present

## 2017-09-21 DIAGNOSIS — M109 Gout, unspecified: Secondary | ICD-10-CM | POA: Diagnosis not present

## 2017-09-21 DIAGNOSIS — Z23 Encounter for immunization: Secondary | ICD-10-CM | POA: Diagnosis not present

## 2017-09-21 DIAGNOSIS — D696 Thrombocytopenia, unspecified: Secondary | ICD-10-CM | POA: Diagnosis not present

## 2017-09-21 DIAGNOSIS — Z1389 Encounter for screening for other disorder: Secondary | ICD-10-CM | POA: Diagnosis not present

## 2017-10-06 ENCOUNTER — Encounter (HOSPITAL_COMMUNITY): Payer: Self-pay | Admitting: Cardiology

## 2017-10-06 ENCOUNTER — Ambulatory Visit (HOSPITAL_COMMUNITY)
Admission: RE | Admit: 2017-10-06 | Discharge: 2017-10-06 | Disposition: A | Discharge status: Home / Self Care | Payer: PPO | Source: Ambulatory Visit | Attending: Cardiology | Admitting: Cardiology

## 2017-10-06 VITALS — BP 155/77 | HR 70 | Wt 191.5 lb

## 2017-10-06 DIAGNOSIS — E1122 Type 2 diabetes mellitus with diabetic chronic kidney disease: Secondary | ICD-10-CM | POA: Diagnosis not present

## 2017-10-06 DIAGNOSIS — N183 Chronic kidney disease, stage 3 (moderate): Secondary | ICD-10-CM | POA: Diagnosis not present

## 2017-10-06 DIAGNOSIS — Z951 Presence of aortocoronary bypass graft: Secondary | ICD-10-CM | POA: Diagnosis not present

## 2017-10-06 DIAGNOSIS — I517 Cardiomegaly: Secondary | ICD-10-CM | POA: Diagnosis not present

## 2017-10-06 DIAGNOSIS — Z905 Acquired absence of kidney: Secondary | ICD-10-CM | POA: Insufficient documentation

## 2017-10-06 DIAGNOSIS — I447 Left bundle-branch block, unspecified: Secondary | ICD-10-CM | POA: Diagnosis not present

## 2017-10-06 DIAGNOSIS — I2582 Chronic total occlusion of coronary artery: Secondary | ICD-10-CM | POA: Diagnosis not present

## 2017-10-06 DIAGNOSIS — I130 Hypertensive heart and chronic kidney disease with heart failure and stage 1 through stage 4 chronic kidney disease, or unspecified chronic kidney disease: Secondary | ICD-10-CM | POA: Diagnosis not present

## 2017-10-06 DIAGNOSIS — Z7982 Long term (current) use of aspirin: Secondary | ICD-10-CM | POA: Diagnosis not present

## 2017-10-06 DIAGNOSIS — N189 Chronic kidney disease, unspecified: Secondary | ICD-10-CM | POA: Diagnosis not present

## 2017-10-06 DIAGNOSIS — I5022 Chronic systolic (congestive) heart failure: Secondary | ICD-10-CM | POA: Insufficient documentation

## 2017-10-06 DIAGNOSIS — M791 Myalgia, unspecified site: Secondary | ICD-10-CM | POA: Insufficient documentation

## 2017-10-06 DIAGNOSIS — M545 Low back pain: Secondary | ICD-10-CM | POA: Diagnosis not present

## 2017-10-06 DIAGNOSIS — Z8546 Personal history of malignant neoplasm of prostate: Secondary | ICD-10-CM | POA: Insufficient documentation

## 2017-10-06 DIAGNOSIS — M109 Gout, unspecified: Secondary | ICD-10-CM | POA: Insufficient documentation

## 2017-10-06 DIAGNOSIS — E785 Hyperlipidemia, unspecified: Secondary | ICD-10-CM | POA: Diagnosis not present

## 2017-10-06 DIAGNOSIS — N62 Hypertrophy of breast: Secondary | ICD-10-CM | POA: Insufficient documentation

## 2017-10-06 DIAGNOSIS — I2510 Atherosclerotic heart disease of native coronary artery without angina pectoris: Secondary | ICD-10-CM | POA: Diagnosis not present

## 2017-10-06 DIAGNOSIS — Z952 Presence of prosthetic heart valve: Secondary | ICD-10-CM | POA: Diagnosis not present

## 2017-10-06 DIAGNOSIS — Z923 Personal history of irradiation: Secondary | ICD-10-CM | POA: Diagnosis not present

## 2017-10-06 DIAGNOSIS — I350 Nonrheumatic aortic (valve) stenosis: Secondary | ICD-10-CM | POA: Diagnosis not present

## 2017-10-06 DIAGNOSIS — Z7984 Long term (current) use of oral hypoglycemic drugs: Secondary | ICD-10-CM | POA: Insufficient documentation

## 2017-10-06 LAB — LIPID PANEL
Cholesterol: 163 mg/dL (ref 0–200)
HDL: 69 mg/dL (ref >40)
LDL CALC: 71 mg/dL (ref 0–99)
TRIGLYCERIDES: 113 mg/dL (ref <150)
Total CHOL/HDL Ratio: 2.4 RATIO
VLDL: 23 mg/dL (ref 0–40)

## 2017-10-06 LAB — BASIC METABOLIC PANEL
ANION GAP: 8 (ref 5–15)
BUN: 29 mg/dL — ABNORMAL HIGH (ref 6–20)
CHLORIDE: 102 mmol/L (ref 101–111)
CO2: 27 mmol/L (ref 22–32)
Calcium: 10 mg/dL (ref 8.9–10.3)
Creatinine, Ser: 1.84 mg/dL — ABNORMAL HIGH (ref 0.61–1.24)
GFR calc Af Amer: 38 mL/min — ABNORMAL LOW (ref >60)
GFR calc non Af Amer: 33 mL/min — ABNORMAL LOW (ref >60)
Glucose, Bld: 191 mg/dL — ABNORMAL HIGH (ref 65–99)
Potassium: 4.7 mmol/L (ref 3.5–5.1)
Sodium: 137 mmol/L (ref 135–145)

## 2017-10-06 MED ORDER — CARVEDILOL 12.5 MG PO TABS: 12.5000 mg | ORAL_TABLET | Freq: Two times a day (BID) | ORAL | 3 refills | Status: DC

## 2017-10-06 MED ORDER — TORSEMIDE 20 MG PO TABS: 20.0000 mg | ORAL_TABLET | ORAL | 3 refills | Status: DC

## 2017-10-06 NOTE — Patient Instructions (Signed)
Increase Carvedilol 12.5 mg (1 tab), twice a day  Torsemide 20 mg (1 tab) every Monday and Friday  Labs drawn today (if we do not call you, then your lab work was stable)   Your physician has requested that you have an echocardiogram. Echocardiography is a painless test that uses sound waves to create images of your heart. It provides your doctor with information about the size and shape of your heart and how well your heart's chambers and valves are working. This procedure takes approximately one hour. There are no restrictions for this procedure.   Your physician recommends that you schedule a follow-up appointment in: 3 months

## 2017-10-07 NOTE — Progress Notes (Signed)
PCP: Dr. Felipa Eth Cardiology: Dr. Tamala Julian HF Cardiology: Dr. Aundra Dubin   83 yo with history of CAD s/p CABG, chronic systolic CHF, CKD stage 3, and suspected severe aortic stenosis presents for followup of CHF and AS.  Patient had CABG in 2007.  In 10/17, he had an echo showing EF down to 20-25%.  With fall in EF, he ended up having a right and left heart cath done in 11/17.  The cath showed normal right and left heart filling pressures.  The LIMA-LAD and SVG-PDA were patent but the SVG-ramus was occluded.  The fall in EF was thought to be disproportionate to the coronary disease. Echo in 4/18 was concerning for low gradient severe AS.  This was confirmed in 5/18 by low dose DSE.   He has had a hard time getting on cardiac meds.  Hydralazine/nitrates were not tolerated due to hypotension.  He has not been on ACEI/ARB/ARNI due to elevated creatinine.  He did not tolerate spironolactone.  He was then on the combination of torsemide, eplerenone, and low dose Coreg.   He felt sleepy/over-sedated and creatinine rose, so he stopped both torsemide and eplerenone.     He is S/P TAVR 06/22/2017.  Echo in 7/18 showed EF 30% with severe LV dilation, prominent dyssynchrony, s/p TAVR with mean gradient 11 mmHg and no AI.  In 7/18 post-TAVR, QRS was noted to widen to LBBB at 188 msec => suspect this led to the dyssynchrony seen on 7/18 echo.  However, more recent ECGs (including today's) have shown narrowing of the QRS again.   Weight is up 6 lbs. He is short of breath only with heavy exertion.  Mild bendopnea.  No chest pain.  He has significant joint pain from arthritis. No orthopnea/PND.  No lightheadedness.  No palpitations.  He has not been taking torsemide.   - EKG 07/15/17: Sinus rhythm 70 bpm, LBBB QRS 188 - EKG 8/18: NSR, IVCD 132 msec.   - EKG (personally reviewed, 10/18): NSR, LVH with repolarization abnormality, QRS 122 msec   Labs (5/18): K 4.7, creatinine 2.29 Labs (6/18): K 4.3, creatinine 1.84 Labs  (7/18): K 4.3, creatinine 2.05 Labs (8/18): K 5.1, creatinine 1.8  PMH: 1. Low back pain: Severe, from arthritis.  2. Gout 3. Hyperlipidemia: Myalgias with simvastatin and atorvastatin.  4. Prostate cancer s/p radiation.  5. Renal cell carcinoma s/p left partial nephrectomy.  6. HTN 7. Type II diabetes 8. CAD: s/p CABG in 2007.  - LHC/RHC (11/17): Totally occluded left main and RCA. Totally occluded SVG-ramus.  Patent SVG-PDA and LIMA-LAD.  Mean RA 7, PA 21/4, mean PCWP 6, CI 2.72.  9. CKD: Stage 3.  10. Chronic systolic CHF: Ischemic cardiomyopathy, though severe AS may play a roll. Gynecomastia with spironolactone.  - Echo (10/17): EF 20-25%, aortic stenosis reported as mild.  - Echo (4/18): EF 25-30%, low gradient severe AS suspected, mild MR, RV function mildly reduced.  - Echo (7/18) with EF 30%, severe LV dilation, septal-lateral dyssynchrony, s/p TAVR with mean gradient 11 mmHg, no AI, mild MR.   11. Aortic stenosis: Suspect low gradient severe AS.  - Low dose DSE (5/18): EF 20-25%, mean gradient across the aortic valve rose from 25 mmHg => 41 mmHg and AVA remained 0.8 cm^2.   - TAVR (7/18) with 29 mm Edwards Sapien 3 valve.  12. Carotid dopplers (6/18) with mild bilateral carotid stenosis.   SH: Lives with wife in [facility].  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   ROS: All systems reviewed and negative except as per HPI.   Family History  Problem Relation Age of Onset  . Heart Problems Mother   . Heart Problems Father    Review of systems complete and found to be negative unless listed in HPI.    Current Outpatient Prescriptions  Medication Sig Dispense Refill  . alendronate (FOSAMAX) 70 MG tablet Take 70 mg by mouth once a week. Take with a full glass of water on an empty stomach.    Marland Kitchen allopurinol (ZYLOPRIM) 300 MG tablet Take 300 mg by mouth daily.  2  . aspirin 81 MG chewable tablet Chew 81 mg by mouth every evening.     .  Calcium Carbonate-Vitamin D 600-200 MG-UNIT TABS Take 1 tablet by mouth daily.    . carvedilol (COREG) 12.5 MG tablet Take 1 tablet (12.5 mg total) by mouth 2 (two) times daily with a meal. 60 tablet 3  . clindamycin (CLEOCIN) 300 MG capsule Take 2 capsules by mouth one hour prior to dental appointment 6 capsule 1  . clopidogrel (PLAVIX) 75 MG tablet Take 1 tablet (75 mg total) by mouth daily with breakfast. 90 tablet 1  . diclofenac sodium (VOLTAREN) 1 % GEL Apply 2 g topically daily as needed (pain).    Marland Kitchen glipiZIDE (GLUCOTROL) 10 MG tablet Take 5-10 mg by mouth 2 (two) times daily before a meal. 10 mg in the morning and 5 mg in the evening  2  . HYDROcodone-acetaminophen (NORCO) 10-325 MG tablet Take 1-2 tablets by mouth every 6 (six) hours as needed (pain).    . Liniments (SALONPAS PAIN RELIEF PATCH EX) Apply 1 patch topically daily as needed (pain).    . methocarbamol (ROBAXIN) 500 MG tablet Take 500 mg by mouth as needed for muscle spasms.    . Multiple Vitamin (MULTIVITAMIN) tablet Take 1 tablet by mouth daily.    . predniSONE (DELTASONE) 1 MG tablet Take 8 mg by mouth daily with breakfast.    . rosuvastatin (CRESTOR) 10 MG tablet Take 10 mg by mouth daily.    . tamsulosin (FLOMAX) 0.4 MG CAPS capsule Take 0.4 mg by mouth daily after supper.    . torsemide (DEMADEX) 20 MG tablet Take 1 tablet (20 mg total) by mouth 2 (two) times a week. On Monday and Friday 8 tablet 3   No current facility-administered medications for this encounter.    Vitals:   10/06/17 1422  BP: (!) 155/77  Pulse: 70  SpO2: 99%  Weight: 191 lb 8 oz (86.9 kg)   Wt Readings from Last 3 Encounters:  10/06/17 191 lb 8 oz (86.9 kg)  08/20/17 185 lb 4 oz (84 kg)  07/15/17 182 lb 12.8 oz (82.9 kg)   General: NAD Neck: JVP 8 cm with HJR, no thyromegaly or thyroid nodule.  Lungs: Clear to auscultation bilaterally with normal respiratory effort. CV: Nondisplaced PMI.  Heart regular S1/S2, no S3/S4, 2/6 early SEM RUSB  with clear S2.  Trace edema.  No carotid bruit.  Normal pedal pulses.  Abdomen: Soft, nontender, no hepatosplenomegaly, no distention.  Skin: Intact without lesions or rashes.  Neurologic: Alert and oriented x 3.  Psych: Normal affect. Extremities: No clubbing or cyanosis.  HEENT: Normal.   Assessment/Plan: 1. CAD: s/p CABG.  Last cath in 11/17 with patent LIMA-LAD, patent SVG-PDA, and occluded SVG-ramus.  No intervention.  No chest pain.  - Continue ASA and statin.  2. CKD: Stage 3.  Followed by Dr. Justin Mend. - BMET today.   3. Chronic systolic CHF: Ischemic and possibly valvular (low gradient severe AS) cardiomyopathy.  He had TAVR in 7/18.  Echo in 7/18 after TAVR showed EF 30% with septal-lateral dyssynchrony.  When last echo was done, ECG showed QRS 188 msec, LBBB.  Today, ECG showed QRS down to 122 msec.  Suspect inflammation/swelling peri-TAVR drove the worsening LBBB, now improved again.  It is possible that EF may be improved with resolution of wide LBBB.  NYHA class II symptoms, mildly volume overloaded on exam.  Weight is up.  - Increase Coreg to 12.5 mg bid. - Increase torsemide back to twice weekly scheduled (had not been taking).  - Hold off on ACEI/ARB/ARNI with elevated creatinine and mildly elevated K.  - Hold off on hydralazine/Imdur for now, caused hypotension.  - Hold off on eplerenone with CKD/elevated K.  Spironolactone caused gynecomastia.  - I will get an echo to assess for improvement in EF with narrowing of QRS. Given age, would not recommend ICD.  4. Aortic stenosis: s/p TAVR with Edwards Sapien 3 bioprosthesis.  5. Hyperlipidemia: Continue crestor.  6. Daytime fatigue: Needs sleep study but has refused scheduling thus far.   Followup in 3 months.   Loralie Champagne 10/07/2017

## 2017-10-14 ENCOUNTER — Encounter (HOSPITAL_COMMUNITY): Payer: Self-pay

## 2017-10-19 ENCOUNTER — Encounter: Payer: Self-pay | Admitting: Interventional Cardiology

## 2017-10-19 ENCOUNTER — Ambulatory Visit (INDEPENDENT_AMBULATORY_CARE_PROVIDER_SITE_OTHER): Payer: PPO | Admitting: Interventional Cardiology

## 2017-10-19 VITALS — BP 154/78 | HR 58 | Ht 66.5 in | Wt 197.2 lb

## 2017-10-19 DIAGNOSIS — N183 Chronic kidney disease, stage 3 unspecified: Secondary | ICD-10-CM

## 2017-10-19 DIAGNOSIS — I10 Essential (primary) hypertension: Secondary | ICD-10-CM

## 2017-10-19 DIAGNOSIS — I25810 Atherosclerosis of coronary artery bypass graft(s) without angina pectoris: Secondary | ICD-10-CM | POA: Diagnosis not present

## 2017-10-19 DIAGNOSIS — I350 Nonrheumatic aortic (valve) stenosis: Secondary | ICD-10-CM

## 2017-10-19 DIAGNOSIS — I5022 Chronic systolic (congestive) heart failure: Secondary | ICD-10-CM | POA: Diagnosis not present

## 2017-10-19 NOTE — Patient Instructions (Signed)
Medication Instructions:  Your physician has recommended you make the following change in your medication:   CHANGE: Take Coreg 6.25 mg daily each morning and 12.5 mg daily at night.   Labwork: None ordered   Testing/Procedures: None ordered   Follow-Up: Your physician wants you to follow-up in: 9 months with Dr. Tamala Julian. You will receive a reminder letter in the mail two months in advance. If you don't receive a letter, please call our office to schedule the follow-up appointment.  Any Other Special Instructions Will Be Listed Below (If Applicable).     If you need a refill on your cardiac medications before your next appointment, please call your pharmacy.

## 2017-10-19 NOTE — Progress Notes (Signed)
Cardiology Office Note    Date:  10/19/2017   ID:  [name], DOB [birth year], MRN [id number]  PCP:  Lajean Manes, MD  Cardiologist: Sinclair Grooms, MD   Chief Complaint  Patient presents with  . Congestive Heart Failure  . Cardiac Valve Problem    Status post aortic valve replacement    History of Present Illness:  [name] is a 83 y.o. 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   He is status post T AVR in July.  There was immediate improvement and EF 45%.  The most recent echocardiogram EF was back down at 30%.  Dr. Aundra Dubin has attempted to further titrate beta-blocker therapy.  Exertional tolerance is improved.  He did not tolerate 12.5 mg twice daily of carvedilol and on his own, decrease the dose back to 6.25 mg twice daily.  His concern was that he felt unsteady.  He denies orthopnea, PND, dizziness, and syncope.  Past Medical History:  Diagnosis Date  . Chondromalacia   . Chronic systolic CHF (congestive heart failure) ([place])   . Coronary artery disease    a. 2007: s/p CABG x3V (LIMA--> LAD, SVG--> PDA, SVG--> ramus)  c. 2017: LHC with 2/3 patent bypass grafts with occuluded SVG--> Ramus   . Diabetes mellitus ([place])   . GERD (gastroesophageal reflux disease)   . Glaucoma    "high normal"  . Gout   . H/O blood clots    R Lower leg, right upper leg  . History of renal cell cancer   . Hyperlipidemia   . Hypertension   . Joint pain   . Low back pain    "severe"  . Obesity   . Osteoporosis   . Polymyalgia ([place])    h/o  . Prostate cancer ([place])   . Severe aortic stenosis    a. 06/2017: s/p TAVR  . Spinal stenosis   . Thrombophlebitis   . Tubular adenoma     Past Surgical History:  Procedure Laterality Date  . APPENDECTOMY  60 years ago  . BACK SURGERY    . CARDIAC  CATHETERIZATION    . CARDIAC CATHETERIZATION N/A 11/03/2016   Procedure: Right/Left Heart Cath and Coronary/Graft Angiography;  Surgeon: Belva Crome, MD;  Location: [facility];  Service: Cardiovascular;  Laterality: N/A;  . CORONARY ARTERY BYPASS GRAFT  2007  . EYE SURGERY Right    /w IOL, post cataracts   . PROSTATE SURGERY     seed implant  . ROBOTIC ASSITED PARTIAL NEPHRECTOMY Left 08/31/2013   Procedure: ROBOTIC ASSITED PARTIAL NEPHRECTOMY;  Surgeon: Dutch Gray, MD;  Location: [facility];  Service: Urology;  Laterality: Left;  . TEE WITHOUT CARDIOVERSION N/A 06/22/2017   Procedure: TRANSESOPHAGEAL ECHOCARDIOGRAM (TEE);  Surgeon: Sherren Mocha, MD;  Location: [facility];  Service: Open Heart Surgery;  Laterality: N/A;  . TRANSCATHETER AORTIC VALVE REPLACEMENT, TRANSFEMORAL N/A 06/22/2017   Procedure: TRANSCATHETER AORTIC VALVE REPLACEMENT, TRANSFEMORAL;  Surgeon: Sherren Mocha, MD;  Location: [facility];  Service: Open Heart Surgery;  Laterality: N/A;  . VEIN LIGATION AND STRIPPING Right 1972   right lower leg    Current Medications: Outpatient Medications Prior to Visit  Medication Sig Dispense Refill  . alendronate (FOSAMAX) 70 MG tablet Take 70 mg by mouth once a week. Take with a  full glass of water on an empty stomach.    Marland Kitchen allopurinol (ZYLOPRIM) 300 MG tablet Take 300 mg by mouth daily.  2  . aspirin 81 MG chewable tablet Chew 81 mg by mouth every evening.     . Calcium Carbonate-Vitamin D 600-200 MG-UNIT TABS Take 1 tablet by mouth daily.    . clindamycin (CLEOCIN) 300 MG capsule Take 2 capsules by mouth one hour prior to dental appointment 6 capsule 1  . clopidogrel (PLAVIX) 75 MG tablet Take 1 tablet (75 mg total) by mouth daily with breakfast. 90 tablet 1  . diclofenac sodium (VOLTAREN) 1 % GEL Apply 2 g topically daily as needed (pain).    Marland Kitchen glipiZIDE (GLUCOTROL) 10 MG tablet Take 5-10 mg by mouth 2 (two) times daily before a meal. 10 mg in the morning and 5 mg in the evening  2    . HYDROcodone-acetaminophen (NORCO) 10-325 MG tablet Take 1-2 tablets by mouth every 6 (six) hours as needed (pain).    . Liniments (SALONPAS PAIN RELIEF PATCH EX) Apply 1 patch topically daily as needed (pain).    . methocarbamol (ROBAXIN) 500 MG tablet Take 500 mg by mouth as needed for muscle spasms.    . Multiple Vitamin (MULTIVITAMIN) tablet Take 1 tablet by mouth daily.    . predniSONE (DELTASONE) 1 MG tablet Take 8 mg by mouth daily with breakfast.    . rosuvastatin (CRESTOR) 10 MG tablet Take 10 mg by mouth daily.    . tamsulosin (FLOMAX) 0.4 MG CAPS capsule Take 0.4 mg by mouth daily after supper.    . torsemide (DEMADEX) 20 MG tablet Take 1 tablet (20 mg total) by mouth 2 (two) times a week. On Monday and Friday 8 tablet 3  . carvedilol (COREG) 12.5 MG tablet Take 1 tablet (12.5 mg total) by mouth 2 (two) times daily with a meal. (Patient not taking: Reported on 10/19/2017) 60 tablet 3   No facility-administered medications prior to visit.      Allergies:   Fish allergy; Lipitor [atorvastatin]; Penicillins; Simvastatin; Adacel [diphth-acell pertussis-tetanus]; Lasix [furosemide]; Septra [sulfamethoxazole-trimethoprim]; Flexeril [cyclobenzaprine]; and Pantoprazole   Social History   Social History  . Marital status: Married    Spouse name: N/A  . Number of children: N/A  . Years of education: N/A   Social History Main Topics  . Smoking status: Former Smoker    Packs/day: 3.00    Years: 30.00    Types: Cigarettes    Quit date: 12/21/1980  . Smokeless tobacco: Never Used  . Alcohol use No  . Drug use: No  . Sexual activity: Not Asked   Other Topics Concern  . None   Social History Narrative  . None     Family History:  The patient's family history includes Heart Problems in his father and mother.   ROS:   Please see the history of present illness.    Back pain is limiting his ability to ambulate.  He also has constipation, difficulty urinating, easy bruising, and  leg swelling.  He denies orthopnea. All other systems reviewed and are negative.   PHYSICAL EXAM:   VS:  BP (!) 154/78 (BP Location: Left Arm)   Pulse (!) 58   Ht 5' 6.5" (1.689 m)   Wt 197 lb 3.2 oz (89.4 kg)   BMI 31.35 kg/m    GEN: Well nourished, well developed, in no acute distress  HEENT: normal  Neck: no JVD, carotid bruits, or masses Cardiac: RRR; there  is a 2/6 crescendo decrescendo systolic murmur.  No rub, gallops,or edema. Respiratory:  clear to auscultation bilaterally, normal work of breathing GI: soft, nontender, nondistended, + BS MS: no deformity or atrophy  Skin: warm and dry, no rash Neuro:  Alert and Oriented x 3, Strength and sensation are intact Psych: euthymic mood, full affect  Wt Readings from Last 3 Encounters:  10/19/17 197 lb 3.2 oz (89.4 kg)  10/06/17 191 lb 8 oz (86.9 kg)  08/20/17 185 lb 4 oz (84 kg)      Studies/Labs Reviewed:   EKG:  EKG not repeated  Recent Labs: 01/25/2017: NT-Pro BNP 1,209 06/09/2017: B Natriuretic Peptide 175.4 06/17/2017: ALT <5 06/23/2017: Hemoglobin 9.7; Magnesium 2.2; Platelets 162 10/06/2017: BUN 29; Creatinine, Ser 1.84; Potassium 4.7; Sodium 137   Lipid Panel    Component Value Date/Time   CHOL 163 10/06/2017 1441   TRIG 113 10/06/2017 1441   HDL 69 10/06/2017 1441   CHOLHDL 2.4 10/06/2017 1441   VLDL 23 10/06/2017 1441   LDLCALC 71 10/06/2017 1441    Additional studies/ records that were reviewed today include:  2D Doppler echocardiogram July 15, 2017: ------------------------------------------------------------------- Study Conclusions  - Procedure narrative: Transthoracic echocardiography. Image   quality was adequate. Intravenous contrast (Definity) was   administered. - Left ventricle: The cavity size was severely dilated. Systolic   function was severely reduced. The estimated ejection fraction   was 30%. Severe diffuse hypokinesis. There was an increased   relative contribution of atrial  contraction to ventricular   filling. Doppler parameters are consistent with abnormal left   ventricular relaxation (grade 1 diastolic dysfunction). - Ventricular septum: Septal motion showed paradox. These changes   are consistent with intraventricular conduction delay. - Aortic valve: S/P TAVR with stable bioprosthetic AVR. The mean AV   gradient is 57mmHg and Peak AV gradient is18mmHg. There is no   evidence of perivalvular leak. - Mitral valve: Calcified annulus with thickened and calcified   chordae tendinae There was mild regurgitation. - Pulmonic valve: There was trivial regurgitation.      ASSESSMENT:    1. Chronic systolic CHF (congestive heart failure) ([place])   2. Coronary artery disease involving coronary bypass graft of native heart without angina pectoris   3. Essential hypertension   4. CKD (chronic kidney disease), stage III ([place])   5. Severe aortic stenosis      PLAN:  In order of problems listed above:  1. Most recent EF was 30%.  EF was 45% immediately post valve.  Carvedilol was recently increased by Dr. Aundra Dubin.  Patient is decreased back to 6.25 mg twice daily.  Unable to use ARB/ACE therapy due to chronic kidney disease.  I have recommended that the patient resume the higher dose of atenolol by taking 12.5 mg each p.m. along with 6.25 each a.m. and monitor how he feels.  If he tolerates this then he should go back to  12.5 mg twice daily.  I explained to the patient that I will leave heart failure medication adjustments to Dr.McLean. 2. Asymptomatic. 3. Elevated today.  May need to add hydralazine/long-acting nitrates.  As noted above I asked the patient to increase carvedilol back to the prescribed dose over time.  He is on twice weekly torsemide.  This may also need to be increased but will leave to the management of Dr. Algernon Huxley.  I know there is concern about CKD. 4. The most recent creatinine was 1.84 on October 17. 5. Status post TAVR with clinically  normal  function.  No aortic regurgitation is heard.  Advanced heart failure clinic will be in charge of heart failure therapy.  I discussed this with the patient.  I will plan to see him back in 9 months.  He should call if angina.  Gradually increase carvedilol to 12.5 mg twice daily.  For the time being we will use 6.25 each a.m. and increase the p.m. dose to 12.5 mg.  After a 1-2-week period of adjustment, return to 12.5 mg twice daily.    Medication Adjustments/Labs and Tests Ordered: Current medicines are reviewed at length with the patient today.  Concerns regarding medicines are outlined above.  Medication changes, Labs and Tests ordered today are listed in the Patient Instructions below. Patient Instructions  Medication Instructions:  Your physician has recommended you make the following change in your medication:   CHANGE: Take Coreg 6.25 mg daily each morning and 12.5 mg daily at night.   Labwork: None ordered   Testing/Procedures: None ordered   Follow-Up: Your physician wants you to follow-up in: 9 months with Dr. Tamala Julian. You will receive a reminder letter in the mail two months in advance. If you don't receive a letter, please call our office to schedule the follow-up appointment.  Any Other Special Instructions Will Be Listed Below (If Applicable).     If you need a refill on your cardiac medications before your next appointment, please call your pharmacy.     Signed, Sinclair Grooms, MD  10/19/2017 11:29 AM    [address], [place], [place]  [address] Phone: [phone]; Fax: [phone]

## 2017-10-27 DIAGNOSIS — D696 Thrombocytopenia, unspecified: Secondary | ICD-10-CM | POA: Diagnosis not present

## 2017-11-09 DIAGNOSIS — N189 Chronic kidney disease, unspecified: Secondary | ICD-10-CM | POA: Diagnosis not present

## 2017-11-09 DIAGNOSIS — M1990 Unspecified osteoarthritis, unspecified site: Secondary | ICD-10-CM | POA: Diagnosis not present

## 2017-11-09 DIAGNOSIS — M65322 Trigger finger, left index finger: Secondary | ICD-10-CM | POA: Diagnosis not present

## 2017-11-09 DIAGNOSIS — M8580 Other specified disorders of bone density and structure, unspecified site: Secondary | ICD-10-CM | POA: Diagnosis not present

## 2017-11-09 DIAGNOSIS — Z8546 Personal history of malignant neoplasm of prostate: Secondary | ICD-10-CM | POA: Diagnosis not present

## 2017-11-09 DIAGNOSIS — M549 Dorsalgia, unspecified: Secondary | ICD-10-CM | POA: Diagnosis not present

## 2017-11-09 DIAGNOSIS — M353 Polymyalgia rheumatica: Secondary | ICD-10-CM | POA: Diagnosis not present

## 2017-11-09 DIAGNOSIS — C61 Malignant neoplasm of prostate: Secondary | ICD-10-CM | POA: Diagnosis not present

## 2017-11-10 ENCOUNTER — Ambulatory Visit (HOSPITAL_COMMUNITY)
Admission: RE | Admit: 2017-11-10 | Discharge: 2017-11-10 | Disposition: A | Discharge status: Home / Self Care | Payer: PPO | Source: Ambulatory Visit | Attending: Cardiology | Admitting: Cardiology

## 2017-11-10 DIAGNOSIS — I5022 Chronic systolic (congestive) heart failure: Secondary | ICD-10-CM | POA: Diagnosis not present

## 2017-11-10 DIAGNOSIS — E119 Type 2 diabetes mellitus without complications: Secondary | ICD-10-CM | POA: Diagnosis not present

## 2017-11-10 DIAGNOSIS — I517 Cardiomegaly: Secondary | ICD-10-CM | POA: Insufficient documentation

## 2017-11-10 MED ORDER — PERFLUTREN LIPID MICROSPHERE
1.0000 mL | INTRAVENOUS | Status: AC | PRN
  Administered 2017-11-10: 2 mL via INTRAVENOUS
  Filled 2017-11-10: qty 10

## 2017-11-10 NOTE — Progress Notes (Signed)
  Echocardiogram 2D Echocardiogram with Definity has been performed.  [name] 11/10/2017, 2:45 PM

## 2017-11-19 DIAGNOSIS — Z8546 Personal history of malignant neoplasm of prostate: Secondary | ICD-10-CM | POA: Diagnosis not present

## 2017-11-19 DIAGNOSIS — N401 Enlarged prostate with lower urinary tract symptoms: Secondary | ICD-10-CM | POA: Diagnosis not present

## 2017-11-19 DIAGNOSIS — C642 Malignant neoplasm of left kidney, except renal pelvis: Secondary | ICD-10-CM | POA: Diagnosis not present

## 2017-11-19 DIAGNOSIS — R351 Nocturia: Secondary | ICD-10-CM | POA: Diagnosis not present

## 2017-11-24 ENCOUNTER — Telehealth (HOSPITAL_COMMUNITY): Payer: Self-pay

## 2017-11-24 NOTE — Telephone Encounter (Signed)
Pt aware of results  Notes recorded by Shirley Muscat, RN on 11/23/2017 at 4:15 PM EST Left VM  ------  Notes recorded by Larey Dresser, MD on 11/14/2017 at 7:46 PM EST EF improved, 40-45%.

## 2017-11-26 DIAGNOSIS — Z79899 Other long term (current) drug therapy: Secondary | ICD-10-CM | POA: Diagnosis not present

## 2017-11-26 DIAGNOSIS — N183 Chronic kidney disease, stage 3 (moderate): Secondary | ICD-10-CM | POA: Diagnosis not present

## 2017-12-06 ENCOUNTER — Ambulatory Visit (INDEPENDENT_AMBULATORY_CARE_PROVIDER_SITE_OTHER)
Admission: RE | Admit: 2017-12-06 | Discharge: 2017-12-06 | Disposition: A | Discharge status: Home / Self Care | Payer: PPO | Source: Ambulatory Visit | Attending: Cardiovascular Disease | Admitting: Cardiovascular Disease

## 2017-12-06 DIAGNOSIS — R911 Solitary pulmonary nodule: Secondary | ICD-10-CM

## 2017-12-07 DIAGNOSIS — M069 Rheumatoid arthritis, unspecified: Secondary | ICD-10-CM | POA: Diagnosis not present

## 2017-12-07 DIAGNOSIS — M47816 Spondylosis without myelopathy or radiculopathy, lumbar region: Secondary | ICD-10-CM | POA: Diagnosis not present

## 2017-12-20 NOTE — Telephone Encounter (Signed)
error 

## 2017-12-24 DIAGNOSIS — N183 Chronic kidney disease, stage 3 (moderate): Secondary | ICD-10-CM | POA: Diagnosis not present

## 2018-01-14 DIAGNOSIS — Z79899 Other long term (current) drug therapy: Secondary | ICD-10-CM | POA: Diagnosis not present

## 2018-01-24 ENCOUNTER — Telehealth: Payer: Self-pay | Admitting: Interventional Cardiology

## 2018-01-24 MED ORDER — CLOPIDOGREL BISULFATE 75 MG PO TABS: 75.0000 mg | ORAL_TABLET | Freq: Every day | ORAL | 2 refills | Status: DC

## 2018-01-24 NOTE — Telephone Encounter (Signed)
Pt's medication was sent to pt's pharmacy as requested. Confirmation received.  °

## 2018-01-24 NOTE — Telephone Encounter (Signed)
New Message   *STAT* If patient is at the pharmacy, call can be transferred to refill team.   1. Which medications need to be refilled? (please list name of each medication and dose if known) Plavix 75mg   2. Which pharmacy/location (including street and city if local pharmacy) is medication to be sent to?* [facility]  3. Do they need a 30 day or 90 day supply? [place]

## 2018-01-26 DIAGNOSIS — L570 Actinic keratosis: Secondary | ICD-10-CM | POA: Diagnosis not present

## 2018-01-26 DIAGNOSIS — L821 Other seborrheic keratosis: Secondary | ICD-10-CM | POA: Diagnosis not present

## 2018-01-26 DIAGNOSIS — L814 Other melanin hyperpigmentation: Secondary | ICD-10-CM | POA: Diagnosis not present

## 2018-01-26 DIAGNOSIS — D225 Melanocytic nevi of trunk: Secondary | ICD-10-CM | POA: Diagnosis not present

## 2018-01-26 DIAGNOSIS — D1801 Hemangioma of skin and subcutaneous tissue: Secondary | ICD-10-CM | POA: Diagnosis not present

## 2018-01-26 DIAGNOSIS — Z85828 Personal history of other malignant neoplasm of skin: Secondary | ICD-10-CM | POA: Diagnosis not present

## 2018-02-09 DIAGNOSIS — M1990 Unspecified osteoarthritis, unspecified site: Secondary | ICD-10-CM | POA: Diagnosis not present

## 2018-02-09 DIAGNOSIS — N189 Chronic kidney disease, unspecified: Secondary | ICD-10-CM | POA: Diagnosis not present

## 2018-02-09 DIAGNOSIS — D539 Nutritional anemia, unspecified: Secondary | ICD-10-CM | POA: Diagnosis not present

## 2018-02-09 DIAGNOSIS — M8580 Other specified disorders of bone density and structure, unspecified site: Secondary | ICD-10-CM | POA: Diagnosis not present

## 2018-02-09 DIAGNOSIS — M549 Dorsalgia, unspecified: Secondary | ICD-10-CM | POA: Diagnosis not present

## 2018-02-09 DIAGNOSIS — M353 Polymyalgia rheumatica: Secondary | ICD-10-CM | POA: Diagnosis not present

## 2018-02-09 DIAGNOSIS — M79643 Pain in unspecified hand: Secondary | ICD-10-CM | POA: Diagnosis not present

## 2018-03-01 DIAGNOSIS — M47816 Spondylosis without myelopathy or radiculopathy, lumbar region: Secondary | ICD-10-CM | POA: Diagnosis not present

## 2018-03-01 DIAGNOSIS — M069 Rheumatoid arthritis, unspecified: Secondary | ICD-10-CM | POA: Diagnosis not present

## 2018-03-18 ENCOUNTER — Ambulatory Visit
Admission: RE | Admit: 2018-03-18 | Discharge: 2018-03-18 | Disposition: A | Discharge status: Home / Self Care | Payer: PPO | Source: Ambulatory Visit | Attending: Geriatric Medicine | Admitting: Geriatric Medicine

## 2018-03-18 ENCOUNTER — Other Ambulatory Visit: Payer: Self-pay | Admitting: Geriatric Medicine

## 2018-03-18 DIAGNOSIS — K5901 Slow transit constipation: Secondary | ICD-10-CM | POA: Diagnosis not present

## 2018-03-18 DIAGNOSIS — N200 Calculus of kidney: Secondary | ICD-10-CM | POA: Diagnosis not present

## 2018-03-18 NOTE — Telephone Encounter (Signed)
error 

## 2018-04-07 DIAGNOSIS — I5022 Chronic systolic (congestive) heart failure: Secondary | ICD-10-CM | POA: Diagnosis not present

## 2018-04-07 DIAGNOSIS — K5901 Slow transit constipation: Secondary | ICD-10-CM | POA: Diagnosis not present

## 2018-04-11 ENCOUNTER — Other Ambulatory Visit (HOSPITAL_COMMUNITY): Payer: Self-pay | Admitting: Cardiology

## 2018-05-11 DIAGNOSIS — M1990 Unspecified osteoarthritis, unspecified site: Secondary | ICD-10-CM | POA: Diagnosis not present

## 2018-05-11 DIAGNOSIS — N189 Chronic kidney disease, unspecified: Secondary | ICD-10-CM | POA: Diagnosis not present

## 2018-05-11 DIAGNOSIS — M353 Polymyalgia rheumatica: Secondary | ICD-10-CM | POA: Diagnosis not present

## 2018-05-11 DIAGNOSIS — M549 Dorsalgia, unspecified: Secondary | ICD-10-CM | POA: Diagnosis not present

## 2018-05-11 DIAGNOSIS — Z7952 Long term (current) use of systemic steroids: Secondary | ICD-10-CM | POA: Diagnosis not present

## 2018-05-11 DIAGNOSIS — M25519 Pain in unspecified shoulder: Secondary | ICD-10-CM | POA: Diagnosis not present

## 2018-05-11 DIAGNOSIS — M542 Cervicalgia: Secondary | ICD-10-CM | POA: Diagnosis not present

## 2018-05-11 DIAGNOSIS — M8580 Other specified disorders of bone density and structure, unspecified site: Secondary | ICD-10-CM | POA: Diagnosis not present

## 2018-05-12 ENCOUNTER — Ambulatory Visit (HOSPITAL_COMMUNITY)
Admission: RE | Admit: 2018-05-12 | Discharge: 2018-05-12 | Disposition: A | Discharge status: Home / Self Care | Payer: PPO | Source: Ambulatory Visit | Attending: Cardiology | Admitting: Cardiology

## 2018-05-12 ENCOUNTER — Other Ambulatory Visit: Payer: Self-pay

## 2018-05-12 ENCOUNTER — Encounter (HOSPITAL_COMMUNITY): Payer: Self-pay | Admitting: Cardiology

## 2018-05-12 VITALS — BP 135/65 | HR 63 | Wt 194.0 lb

## 2018-05-12 DIAGNOSIS — Z8249 Family history of ischemic heart disease and other diseases of the circulatory system: Secondary | ICD-10-CM | POA: Insufficient documentation

## 2018-05-12 DIAGNOSIS — Z7982 Long term (current) use of aspirin: Secondary | ICD-10-CM | POA: Diagnosis not present

## 2018-05-12 DIAGNOSIS — Z87891 Personal history of nicotine dependence: Secondary | ICD-10-CM | POA: Diagnosis not present

## 2018-05-12 DIAGNOSIS — I130 Hypertensive heart and chronic kidney disease with heart failure and stage 1 through stage 4 chronic kidney disease, or unspecified chronic kidney disease: Secondary | ICD-10-CM | POA: Diagnosis not present

## 2018-05-12 DIAGNOSIS — I2582 Chronic total occlusion of coronary artery: Secondary | ICD-10-CM | POA: Insufficient documentation

## 2018-05-12 DIAGNOSIS — I25810 Atherosclerosis of coronary artery bypass graft(s) without angina pectoris: Secondary | ICD-10-CM | POA: Diagnosis not present

## 2018-05-12 DIAGNOSIS — I350 Nonrheumatic aortic (valve) stenosis: Secondary | ICD-10-CM | POA: Diagnosis not present

## 2018-05-12 DIAGNOSIS — Z7952 Long term (current) use of systemic steroids: Secondary | ICD-10-CM | POA: Insufficient documentation

## 2018-05-12 DIAGNOSIS — Z79891 Long term (current) use of opiate analgesic: Secondary | ICD-10-CM | POA: Diagnosis not present

## 2018-05-12 DIAGNOSIS — E1122 Type 2 diabetes mellitus with diabetic chronic kidney disease: Secondary | ICD-10-CM | POA: Diagnosis not present

## 2018-05-12 DIAGNOSIS — Z8546 Personal history of malignant neoplasm of prostate: Secondary | ICD-10-CM | POA: Insufficient documentation

## 2018-05-12 DIAGNOSIS — E785 Hyperlipidemia, unspecified: Secondary | ICD-10-CM | POA: Insufficient documentation

## 2018-05-12 DIAGNOSIS — N183 Chronic kidney disease, stage 3 unspecified: Secondary | ICD-10-CM

## 2018-05-12 DIAGNOSIS — Z7902 Long term (current) use of antithrombotics/antiplatelets: Secondary | ICD-10-CM | POA: Insufficient documentation

## 2018-05-12 DIAGNOSIS — Z952 Presence of prosthetic heart valve: Secondary | ICD-10-CM | POA: Insufficient documentation

## 2018-05-12 DIAGNOSIS — Z905 Acquired absence of kidney: Secondary | ICD-10-CM | POA: Diagnosis not present

## 2018-05-12 DIAGNOSIS — I5022 Chronic systolic (congestive) heart failure: Secondary | ICD-10-CM | POA: Diagnosis not present

## 2018-05-12 DIAGNOSIS — M545 Low back pain: Secondary | ICD-10-CM | POA: Insufficient documentation

## 2018-05-12 DIAGNOSIS — N62 Hypertrophy of breast: Secondary | ICD-10-CM | POA: Diagnosis not present

## 2018-05-12 DIAGNOSIS — M791 Myalgia, unspecified site: Secondary | ICD-10-CM | POA: Diagnosis not present

## 2018-05-12 DIAGNOSIS — Z79899 Other long term (current) drug therapy: Secondary | ICD-10-CM | POA: Insufficient documentation

## 2018-05-12 DIAGNOSIS — M109 Gout, unspecified: Secondary | ICD-10-CM | POA: Diagnosis not present

## 2018-05-12 DIAGNOSIS — I447 Left bundle-branch block, unspecified: Secondary | ICD-10-CM | POA: Diagnosis not present

## 2018-05-12 LAB — BASIC METABOLIC PANEL
Anion gap: 8 (ref 5–15)
BUN: 33 mg/dL — ABNORMAL HIGH (ref 6–20)
CHLORIDE: 105 mmol/L (ref 101–111)
CO2: 31 mmol/L (ref 22–32)
CREATININE: 1.77 mg/dL — AB (ref 0.61–1.24)
Calcium: 9.7 mg/dL (ref 8.9–10.3)
GFR, EST AFRICAN AMERICAN: 40 mL/min — AB (ref >60)
GFR, EST NON AFRICAN AMERICAN: 34 mL/min — AB (ref >60)
Glucose, Bld: 127 mg/dL — ABNORMAL HIGH (ref 65–99)
Potassium: 4.3 mmol/L (ref 3.5–5.1)
SODIUM: 144 mmol/L (ref 135–145)

## 2018-05-12 MED ORDER — CARVEDILOL 12.5 MG PO TABS: 12.5000 mg | ORAL_TABLET | Freq: Two times a day (BID) | ORAL | 3 refills | Status: DC

## 2018-05-12 NOTE — Patient Instructions (Signed)
Increase Carvadilol 12.5 mg (1 tab), twice a day  Labs drawn today (if we do not call you, then your lab work was stable)   Your physician has requested that you have an echocardiogram. Echocardiography is a painless test that uses sound waves to create images of your heart. It provides your doctor with information about the size and shape of your heart and how well your heart's chambers and valves are working. This procedure takes approximately one hour. There are no restrictions for this procedure.  Your physician recommends that you schedule a follow-up appointment in: November,2018 with Dr. Aundra Dubin  Please Call an Schedule Appointment (Call in August,2019)

## 2018-05-15 NOTE — Progress Notes (Signed)
PCP: Dr. Felipa Eth Cardiology: Dr. Tamala Julian HF Cardiology: Dr. Aundra Dubin   84 yo with history of CAD s/p CABG, chronic systolic CHF, CKD stage 3, and suspected severe aortic stenosis presents for followup of CHF and AS.  Patient had CABG in 2007.  In 10/17, he had an echo showing EF down to 20-25%.  With fall in EF, he ended up having a right and left heart cath done in 11/17.  The cath showed normal right and left heart filling pressures.  The LIMA-LAD and SVG-PDA were patent but the SVG-ramus was occluded.  The fall in EF was thought to be disproportionate to the coronary disease. Echo in 4/18 was concerning for low gradient severe AS.  This was confirmed in 5/18 by low dose DSE.   He has had a hard time getting on cardiac meds.  Hydralazine/nitrates were not tolerated due to hypotension.  He has not been on ACEI/ARB/ARNI due to elevated creatinine.  He did not tolerate spironolactone.  He was then on the combination of torsemide, eplerenone, and low dose Coreg.   He felt sleepy/over-sedated and creatinine rose, so he stopped both torsemide and eplerenone.     He is S/P TAVR 06/22/2017.  Echo in 7/18 showed EF 30% with severe LV dilation, prominent dyssynchrony, s/p TAVR with mean gradient 11 mmHg and no AI.  In 7/18 post-TAVR, QRS was noted to widen to LBBB at 188 msec => suspect this led to the dyssynchrony seen on 7/18 echo.  However, more recent ECGs have shown narrowing of the QRS again.  Last echo in 11/18 showed EF up to 40-45% with normal bioprosthetic aortic valve s/p TAVR.   He has been doing well recently.  Main complaint is back pain which limits his exercise.  No dyspnea walking though not particularly active.  He can get up a flight of stairs with no dyspnea (just back pain).  No lightheadedness.  No chest pain.  No orthopnea/PND.     - EKG 07/15/17: Sinus rhythm 70 bpm, LBBB QRS 188 - EKG 8/18: NSR, IVCD 132 msec.   - EKG (10/18): NSR, LVH with repolarization abnormality, QRS 122 msec   Labs  (5/18): K 4.7, creatinine 2.29 Labs (6/18): K 4.3, creatinine 1.84 Labs (7/18): K 4.3, creatinine 2.05 Labs (8/18): K 5.1, creatinine 1.8 Labs (10/18): LDL 71, HDL 69, K 4.7, creatinine 1.84  PMH: 1. Low back pain: Severe, from arthritis.  2. Gout 3. Hyperlipidemia: Myalgias with simvastatin and atorvastatin.  4. Prostate cancer s/p radiation.  5. Renal cell carcinoma s/p left partial nephrectomy.  6. HTN 7. Type II diabetes 8. CAD: s/p CABG in 2007.  - LHC/RHC (11/17): Totally occluded left main and RCA. Totally occluded SVG-ramus.  Patent SVG-PDA and LIMA-LAD.  Mean RA 7, PA 21/4, mean PCWP 6, CI 2.72.  9. CKD: Stage 3.  10. Chronic systolic CHF: Ischemic cardiomyopathy, though severe AS may play a roll. Gynecomastia with spironolactone.  - Echo (10/17): EF 20-25%, aortic stenosis reported as mild.  - Echo (4/18): EF 25-30%, low gradient severe AS suspected, mild MR, RV function mildly reduced.  - Echo (7/18) with EF 30%, severe LV dilation, septal-lateral dyssynchrony, s/p TAVR with mean gradient 11 mmHg, no AI, mild MR.   11. Aortic stenosis: Suspect low gradient severe AS.  - Low dose DSE (5/18): EF 20-25%, mean gradient across the aortic valve rose from 25 mmHg => 41 mmHg and AVA remained 0.8 cm^2.   - TAVR (7/18) with 29 mm Edwards Sapien  3 valve.  - Echo (11/18): EF 40-45%, mild LV dilation, normal bioprosthetic aortic valve s/p TAVR with mean gradient 9 mmHg, RV moderately dilated.   12. Carotid dopplers (6/18) with mild bilateral carotid stenosis.   SH: Lives with wife in [facility].  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  He has not smoked in > 30 years.   ROS: All systems reviewed and negative except as per HPI.   Family History  Problem Relation Age of Onset  . Heart Problems Mother   . Heart Problems Father    Review of systems complete and found to be negative unless listed in HPI.    Current Outpatient Medications  Medication Sig Dispense Refill  .  alendronate (FOSAMAX) 70 MG tablet Take 70 mg by mouth once a week. Take with a full glass of water on an empty stomach.    Marland Kitchen allopurinol (ZYLOPRIM) 300 MG tablet Take 300 mg by mouth daily.  2  . aspirin 81 MG chewable tablet Chew 81 mg by mouth every evening.     . Calcium Carbonate-Vitamin D 600-200 MG-UNIT TABS Take 1 tablet by mouth daily.    . carvedilol (COREG) 12.5 MG tablet Take 1 tablet (12.5 mg total) by mouth 2 (two) times daily with a meal. 60 tablet 3  . clindamycin (CLEOCIN) 300 MG capsule Take 2 capsules by mouth one hour prior to dental appointment 6 capsule 1  . clopidogrel (PLAVIX) 75 MG tablet Take 1 tablet (75 mg total) by mouth daily with breakfast. 90 tablet 2  . diclofenac sodium (VOLTAREN) 1 % GEL Apply 2 g topically daily as needed (pain).    Marland Kitchen glipiZIDE (GLUCOTROL) 10 MG tablet Take 5-10 mg by mouth 2 (two) times daily before a meal. 10 mg in the morning and 5 mg in the evening  2  . HYDROcodone-acetaminophen (NORCO) 10-325 MG tablet Take 1-2 tablets by mouth every 6 (six) hours as needed (pain).    . Liniments (SALONPAS PAIN RELIEF PATCH EX) Apply 1 patch topically daily as needed (pain).    . methocarbamol (ROBAXIN) 500 MG tablet Take 500 mg by mouth as needed for muscle spasms.    . Multiple Vitamin (MULTIVITAMIN) tablet Take 1 tablet by mouth daily.    . predniSONE (DELTASONE) 1 MG tablet Take 8 mg by mouth daily with breakfast.    . rosuvastatin (CRESTOR) 10 MG tablet Take 10 mg by mouth daily.    . tamsulosin (FLOMAX) 0.4 MG CAPS capsule Take 0.4 mg by mouth daily after supper.    . torsemide (DEMADEX) 20 MG tablet Take 1 tablet (20 mg total) by mouth 2 (two) times a week. On Monday and Friday 8 tablet 3   No current facility-administered medications for this encounter.    Vitals:   05/12/18 1535  BP: 135/65  Pulse: 63  SpO2: 97%  Weight: 194 lb (88 kg)   Wt Readings from Last 3 Encounters:  05/12/18 194 lb (88 kg)  10/19/17 197 lb 3.2 oz (89.4 kg)   10/06/17 191 lb 8 oz (86.9 kg)   General: NAD Neck: No JVD, no thyromegaly or thyroid nodule.  Lungs: Clear to auscultation bilaterally with normal respiratory effort. CV: Nondisplaced PMI.  Heart regular S1/S2, no S3/S4, 2/6 SEM RUSB.  Trace ankle edema.  No carotid bruit.  Normal pedal pulses.  Abdomen: Soft, nontender, no hepatosplenomegaly, no distention.  Skin: Intact without lesions or rashes.  Neurologic: Alert and oriented x 3.  Psych: Normal affect.  Extremities: No clubbing or cyanosis.  HEENT: Normal.   Assessment/Plan: 1. CAD: s/p CABG.  Last cath in 11/17 with patent LIMA-LAD, patent SVG-PDA, and occluded SVG-ramus.  No intervention.  No chest pain.  - Continue ASA and statin.     2. CKD: Stage 3.  Followed by Dr. Justin Mend. - BMET today.   3. Chronic systolic CHF: Ischemic and possibly valvular (low gradient severe AS) cardiomyopathy.  He had TAVR in 7/18.  Echo in 7/18 after TAVR showed EF 30% with septal-lateral dyssynchrony.  When 7/18 echo was done, ECG showed QRS 188 msec, LBBB.  In 10/18, ECG showed QRS down to 122 msec.  Suspect inflammation/swelling peri-TAVR drove the worsening LBBB, now improved again.  EF up to 40-45% on 11/18 echo.  NYHA class II symptoms, mainly limited by low back pain. He is not volume overloaded on exam.   - Increase Coreg back to 12.5 mg bid. - Continue torsemide twice weekly (BMET today).  - Hold off on ACEI/ARB/ARNI with elevated creatinine and mildly elevated K.  - Hold off on hydralazine/Imdur for now, caused hypotension.  - Hold off on eplerenone with CKD/elevated K.  Spironolactone caused gynecomastia.  - Repeat echo in 11/19. If EF remains low and creatinine stabilizes, may try eplerenone +/- low dose losartan.   4. Aortic stenosis: s/p TAVR with Edwards Sapien 3 bioprosthesis.  5. Hyperlipidemia: Continue crestor.   Followup in 11/18 with echo.   Loralie Champagne 05/15/2018

## 2018-05-25 DIAGNOSIS — N183 Chronic kidney disease, stage 3 (moderate): Secondary | ICD-10-CM | POA: Diagnosis not present

## 2018-05-25 DIAGNOSIS — D692 Other nonthrombocytopenic purpura: Secondary | ICD-10-CM | POA: Diagnosis not present

## 2018-05-25 DIAGNOSIS — Z7984 Long term (current) use of oral hypoglycemic drugs: Secondary | ICD-10-CM | POA: Diagnosis not present

## 2018-05-25 DIAGNOSIS — E1142 Type 2 diabetes mellitus with diabetic polyneuropathy: Secondary | ICD-10-CM | POA: Diagnosis not present

## 2018-05-25 DIAGNOSIS — M85851 Other specified disorders of bone density and structure, right thigh: Secondary | ICD-10-CM | POA: Diagnosis not present

## 2018-05-25 DIAGNOSIS — R42 Dizziness and giddiness: Secondary | ICD-10-CM | POA: Diagnosis not present

## 2018-05-25 DIAGNOSIS — E1121 Type 2 diabetes mellitus with diabetic nephropathy: Secondary | ICD-10-CM | POA: Diagnosis not present

## 2018-05-25 DIAGNOSIS — M353 Polymyalgia rheumatica: Secondary | ICD-10-CM | POA: Diagnosis not present

## 2018-06-13 ENCOUNTER — Other Ambulatory Visit: Payer: Self-pay | Admitting: Physician Assistant

## 2018-06-13 DIAGNOSIS — Z952 Presence of prosthetic heart valve: Secondary | ICD-10-CM

## 2018-07-05 DIAGNOSIS — N183 Chronic kidney disease, stage 3 (moderate): Secondary | ICD-10-CM | POA: Diagnosis not present

## 2018-07-05 DIAGNOSIS — Z7984 Long term (current) use of oral hypoglycemic drugs: Secondary | ICD-10-CM | POA: Diagnosis not present

## 2018-07-05 DIAGNOSIS — C642 Malignant neoplasm of left kidney, except renal pelvis: Secondary | ICD-10-CM | POA: Diagnosis not present

## 2018-07-05 DIAGNOSIS — I2510 Atherosclerotic heart disease of native coronary artery without angina pectoris: Secondary | ICD-10-CM | POA: Diagnosis not present

## 2018-07-05 DIAGNOSIS — N400 Enlarged prostate without lower urinary tract symptoms: Secondary | ICD-10-CM | POA: Diagnosis not present

## 2018-07-05 DIAGNOSIS — E1142 Type 2 diabetes mellitus with diabetic polyneuropathy: Secondary | ICD-10-CM | POA: Diagnosis not present

## 2018-07-05 DIAGNOSIS — M810 Age-related osteoporosis without current pathological fracture: Secondary | ICD-10-CM | POA: Diagnosis not present

## 2018-07-05 DIAGNOSIS — I5022 Chronic systolic (congestive) heart failure: Secondary | ICD-10-CM | POA: Diagnosis not present

## 2018-07-13 ENCOUNTER — Other Ambulatory Visit: Payer: Self-pay

## 2018-07-13 ENCOUNTER — Ambulatory Visit: Payer: PPO | Admitting: Physician Assistant

## 2018-07-13 ENCOUNTER — Ambulatory Visit (HOSPITAL_COMMUNITY): Payer: PPO | Attending: Internal Medicine

## 2018-07-13 ENCOUNTER — Encounter: Payer: Self-pay | Admitting: Physician Assistant

## 2018-07-13 VITALS — BP 148/72 | HR 57 | Ht 66.0 in | Wt 196.0 lb

## 2018-07-13 DIAGNOSIS — N183 Chronic kidney disease, stage 3 unspecified: Secondary | ICD-10-CM

## 2018-07-13 DIAGNOSIS — I25810 Atherosclerosis of coronary artery bypass graft(s) without angina pectoris: Secondary | ICD-10-CM

## 2018-07-13 DIAGNOSIS — I10 Essential (primary) hypertension: Secondary | ICD-10-CM | POA: Diagnosis not present

## 2018-07-13 DIAGNOSIS — R911 Solitary pulmonary nodule: Secondary | ICD-10-CM | POA: Diagnosis not present

## 2018-07-13 DIAGNOSIS — I5022 Chronic systolic (congestive) heart failure: Secondary | ICD-10-CM | POA: Diagnosis not present

## 2018-07-13 DIAGNOSIS — E119 Type 2 diabetes mellitus without complications: Secondary | ICD-10-CM | POA: Diagnosis not present

## 2018-07-13 DIAGNOSIS — Z951 Presence of aortocoronary bypass graft: Secondary | ICD-10-CM | POA: Diagnosis not present

## 2018-07-13 DIAGNOSIS — Z48812 Encounter for surgical aftercare following surgery on the circulatory system: Secondary | ICD-10-CM | POA: Insufficient documentation

## 2018-07-13 DIAGNOSIS — I2510 Atherosclerotic heart disease of native coronary artery without angina pectoris: Secondary | ICD-10-CM | POA: Diagnosis not present

## 2018-07-13 DIAGNOSIS — E669 Obesity, unspecified: Secondary | ICD-10-CM | POA: Insufficient documentation

## 2018-07-13 DIAGNOSIS — Z952 Presence of prosthetic heart valve: Secondary | ICD-10-CM | POA: Diagnosis not present

## 2018-07-13 DIAGNOSIS — I509 Heart failure, unspecified: Secondary | ICD-10-CM | POA: Insufficient documentation

## 2018-07-13 DIAGNOSIS — Z6830 Body mass index (BMI) 30.0-30.9, adult: Secondary | ICD-10-CM | POA: Insufficient documentation

## 2018-07-13 DIAGNOSIS — I340 Nonrheumatic mitral (valve) insufficiency: Secondary | ICD-10-CM | POA: Insufficient documentation

## 2018-07-13 NOTE — Progress Notes (Signed)
[facility]                                       Cardiology Office Note    Date:  07/14/2018   ID:  [name], DOB June 22, 1934, MRN [id number]  PCP:  Lajean Manes, MD  Cardiologist:  Dr. Aundra Dubin / Dr. Burt Knack & Dr. Cyndia Bent (TAVR)   CC: 1 year s/p TAVR   History of Present Illness:  [name] is a 84 y.o. male with a history of DMT2, HTN, stage IV CKD, protate cancer s/p radiation, RCC s/p partial L nephrectomy, HLD, CAD s/p CABG (2007) known SVG--> Ramus occlusion, chronic systolic CHF and severe stage D low gradient aortic stenosis s/p TAVR (06/22/18) who presents to clinic for follow up.   The patient has a history of coronary artery disease and multivessel CABG in 2007. He developed progressive heart failure and LV dysfunction and was found to have severe low flow low gradient aortic stenosis based on dobutamine echo assessment. He ultimately was treated with TAVR on 06/22/2017 via a percutaneous transfemoral approach. A 29 mm Sapien 3 transcatheter heart valve was used.   The patient has been unable to tolerate an aggressive heart failure regimen because of hypotension and worsening renal function with different medications. He is followed closely in the CHF clinic by Dr. Aundra Dubin.  1 month post op echo showed EF 30% with severe LV dilation, prominent dyssynchrony, normal TAVR with mean gradient of 11 mmHg and no paravalvular regurgitation. In 7/18 post-TAVR, QRS was noted to widen to LBBB at 188 msec => suspect this led to the dyssynchrony seen on 7/18 echo.  However, more recent ECGs have shown narrowing of his QRS.   Today he presents to clinic for follow up. No CP. He continues to get short of breath with walking but that's mostly from the severe back pain that he gets. He can pedal on a bicycle with no problems. He is mostly limited by orthopedic issues which limits his activities. He does have some LE edema on days he doesn't take  torsemide. No orthopnea or PND. No dizziness or syncope. No blood in stool or urine. No palpitations.     Past Medical History:  Diagnosis Date  . Chondromalacia   . Chronic systolic CHF (congestive heart failure) ([place])   . Coronary artery disease    a. 2007: s/p CABG x3V (LIMA--> LAD, SVG--> PDA, SVG--> ramus)  c. 2017: LHC with 2/3 patent bypass grafts with occuluded SVG--> Ramus   . Diabetes mellitus ([place])   . GERD (gastroesophageal reflux disease)   . Glaucoma    "high normal"  . Gout   . H/O blood clots    R Lower leg, right upper leg  . History of renal cell cancer   . Hyperlipidemia   . Hypertension   . Joint pain   . Low back pain    "severe"  . Obesity   . Osteoporosis   . Polymyalgia ([place])    h/o  . Prostate cancer ([place])   . Severe aortic stenosis    a. 06/2017: s/p TAVR  . Spinal stenosis   . Thrombophlebitis   . Tubular adenoma     Past Surgical History:  Procedure Laterality Date  . APPENDECTOMY  60 years ago  . BACK SURGERY    . CARDIAC CATHETERIZATION    .  CARDIAC CATHETERIZATION N/A 11/03/2016   Procedure: Right/Left Heart Cath and Coronary/Graft Angiography;  Surgeon: Belva Crome, MD;  Location: [facility];  Service: Cardiovascular;  Laterality: N/A;  . CORONARY ARTERY BYPASS GRAFT  2007  . EYE SURGERY Right    /w IOL, post cataracts   . PROSTATE SURGERY     seed implant  . ROBOTIC ASSITED PARTIAL NEPHRECTOMY Left 08/31/2013   Procedure: ROBOTIC ASSITED PARTIAL NEPHRECTOMY;  Surgeon: Dutch Gray, MD;  Location: [facility];  Service: Urology;  Laterality: Left;  . TEE WITHOUT CARDIOVERSION N/A 06/22/2017   Procedure: TRANSESOPHAGEAL ECHOCARDIOGRAM (TEE);  Surgeon: Sherren Mocha, MD;  Location: [facility];  Service: Open Heart Surgery;  Laterality: N/A;  . TRANSCATHETER AORTIC VALVE REPLACEMENT, TRANSFEMORAL N/A 06/22/2017   Procedure: TRANSCATHETER AORTIC VALVE REPLACEMENT, TRANSFEMORAL;  Surgeon: Sherren Mocha, MD;  Location: [facility];  Service: Open  Heart Surgery;  Laterality: N/A;  . VEIN LIGATION AND STRIPPING Right 1972   right lower leg    Current Medications: Outpatient Medications Prior to Visit  Medication Sig Dispense Refill  . alendronate (FOSAMAX) 70 MG tablet Take 70 mg by mouth once a week. Take with a full glass of water on an empty stomach.    Marland Kitchen allopurinol (ZYLOPRIM) 300 MG tablet Take 300 mg by mouth daily.  2  . aspirin 81 MG chewable tablet Chew 81 mg by mouth every evening.     . Calcium Carbonate-Vitamin D 600-200 MG-UNIT TABS Take 1 tablet by mouth daily.    . carvedilol (COREG) 12.5 MG tablet Take 1 tablet (12.5 mg total) by mouth 2 (two) times daily with a meal. 60 tablet 3  . clindamycin (CLEOCIN) 300 MG capsule Take 2 capsules by mouth one hour prior to dental appointment 6 capsule 1  . diclofenac sodium (VOLTAREN) 1 % GEL Apply 2 g topically daily as needed (pain).    Marland Kitchen glipiZIDE (GLUCOTROL) 10 MG tablet Take 5-10 mg by mouth 2 (two) times daily before a meal. 10 mg in the morning and 5 mg in the evening  2  . HYDROcodone-acetaminophen (NORCO) 10-325 MG tablet Take 1-2 tablets by mouth every 6 (six) hours as needed (pain).    . Liniments (SALONPAS PAIN RELIEF PATCH EX) Apply 1 patch topically daily as needed (pain).    . methocarbamol (ROBAXIN) 500 MG tablet Take 500 mg by mouth as needed for muscle spasms.    . Multiple Vitamin (MULTIVITAMIN) tablet Take 1 tablet by mouth daily.    . predniSONE (DELTASONE) 1 MG tablet Take 8 mg by mouth daily with breakfast.    . rosuvastatin (CRESTOR) 10 MG tablet Take 10 mg by mouth daily.    . tamsulosin (FLOMAX) 0.4 MG CAPS capsule Take 0.4 mg by mouth daily after supper.    . torsemide (DEMADEX) 20 MG tablet Take 1 tablet (20 mg total) by mouth 2 (two) times a week. On Monday and Friday 8 tablet 3  . clopidogrel (PLAVIX) 75 MG tablet Take 1 tablet (75 mg total) by mouth daily with breakfast. 90 tablet 2   No facility-administered medications prior to visit.       Allergies:   Fish allergy; Lipitor [atorvastatin]; Penicillins; Simvastatin; Adacel [tetanus-diphth-acell pertussis]; Lasix [furosemide]; Septra [sulfamethoxazole-trimethoprim]; Flexeril [cyclobenzaprine]; and Pantoprazole   Social History   Socioeconomic History  . Marital status: Married    Spouse name: Not on file  . Number of children: Not on file  . Years of education: Not on file  .  Highest education level: Not on file  Occupational History  . Not on file  Social Needs  . Financial resource strain: Not on file  . Food insecurity:    Worry: Not on file    Inability: Not on file  . Transportation needs:    Medical: Not on file    Non-medical: Not on file  Tobacco Use  . Smoking status: Former Smoker    Packs/day: 3.00    Years: 30.00    Pack years: 90.00    Types: Cigarettes    Last attempt to quit: 12/21/1980    Years since quitting: 37.5  . Smokeless tobacco: Never Used  Substance and Sexual Activity  . Alcohol use: No  . Drug use: No  . Sexual activity: Not on file  Lifestyle  . Physical activity:    Days per week: Not on file    Minutes per session: Not on file  . Stress: Not on file  Relationships  . Social connections:    Talks on phone: Not on file    Gets together: Not on file    Attends religious service: Not on file    Active member of club or organization: Not on file    Attends meetings of clubs or organizations: Not on file    Relationship status: Not on file  Other Topics Concern  . Not on file  Social History Narrative  . Not on file     Family History:  The patient's family history includes Heart Problems in his father and mother.      ROS:   Please see the history of present illness.    ROS All other systems reviewed and are negative.   PHYSICAL EXAM:   VS:  BP (!) 148/72   Pulse (!) 57   Ht 5\' 6"  (1.676 m)   Wt 196 lb (88.9 kg)   SpO2 96%   BMI 31.64 kg/m    GEN: Well nourished, well developed, in no acute distress,  obese HEENT: normal  Neck: no JVD, carotid bruits, or masses Cardiac: RRR; 2/6 SEM @RUSB . No rubs, or gallops,no edema  Respiratory:  clear to auscultation bilaterally, normal work of breathing GI: soft, nontender, nondistended, + BS MS: no deformity or atrophy  Skin: warm and dry, no rash Neuro:  Alert and Oriented x 3, Strength and sensation are intact Psych: euthymic mood, full affect   Wt Readings from Last 3 Encounters:  07/13/18 196 lb (88.9 kg)  05/12/18 194 lb (88 kg)  10/19/17 197 lb 3.2 oz (89.4 kg)      Studies/Labs Reviewed:   EKG:  EKG is NOT ordered today.    Recent Labs: 05/12/2018: BUN 33; Creatinine, Ser 1.77; Potassium 4.3; Sodium 144   Lipid Panel    Component Value Date/Time   CHOL 163 10/06/2017 1441   TRIG 113 10/06/2017 1441   HDL 69 10/06/2017 1441   CHOLHDL 2.4 10/06/2017 1441   VLDL 23 10/06/2017 1441   LDLCALC 71 10/06/2017 1441    Additional studies/ records that were reviewed today include:  Procedure:TAVR 06/22/17  Transcatheter Aortic Valve Replacement - Percutaneous LeftTransfemoral Approach Edwards Sapien 3 THV (size 63mm, model # 9600TFX, serial # [id number])  Co-Surgeons:Bryan Alveria Apley, MD and Sherren Mocha, MD  Pre-operative Echo Findings: ? Severe aortic stenosis ? Severe left ventricular systolic dysfunction   Post-operative Echo Findings: ? Trivialparavalvular leak ? Improvedleft ventricular systolic function  _____________  2D Echo 07/15/2017 (1 month s/p TAVR) Study Conclusions - Procedure  narrative: Transthoracic echocardiography. Image quality was adequate. Intravenous contrast (Definity) was administered. - Left ventricle: The cavity size was severely dilated. Systolic function was severely reduced. The estimated ejection fraction was 30%. Severe diffuse hypokinesis. There was an increased relative contribution of atrial contraction to ventricular filling. Doppler parameters  are consistent with abnormal left ventricular relaxation (grade 1 diastolic dysfunction). - Ventricular septum: Septal motion showed paradox. These changes are consistent with intraventricular conduction delay. - Aortic valve: S/P TAVR with stable bioprosthetic AVR. The mean AV gradient is 76mmHg and Peak AV gradient is61mmHg. There is no evidence of perivalvular leak. - Mitral valve: Calcified annulus with thickened and calcified chordae tendinae There was mild regurgitation. - Pulmonic valve: There was trivial regurgitation.   _____________  2D ECHO 07/13/18 (1 year s/p TAVR) Study Conclusions - Left ventricle: The cavity size was normal. Wall thickness was   increased in a pattern of moderate LVH. Systolic function was   normal. The estimated ejection fraction was in the range of 50%   to 55%. Wall motion was normal; there were no regional wall   motion abnormalities. Doppler parameters are consistent with   abnormal left ventricular relaxation (grade 1 diastolic   dysfunction). The E/e&' ratio is between 8-15, suggesting   indeterminate LV filling pressure. - Aortic valve: s/p Edwards Sapien 3 THV. No obstruction or   paravalvular leak. Mean gradient (S): 15 mm Hg. Peak gradient   (S): 31 mm Hg. Valve area (VTI): 1.27 cm^2. Valve area (Vmax):   1.25 cm^2. Valve area (Vmean): 1.33 cm^2. - Mitral valve: Calcified annulus. There was mild regurgitation. - Left atrium: The atrium was normal in size. - Inferior vena cava: The vessel was normal in size. The   respirophasic diameter changes were in the normal range (>= 50%),   consistent with normal central venous pressure. Impressions: - Compared to a prior study in 10/2017, the LVEF has improved to   50-55%. The TAVR gradient is increased to 15 mmHg mean.   ASSESSMENT & PLAN:   Severe AS s/p TAVR: 2D ECHO today shows EF 50-55%, normally functioning TAVR valve with a mean gradient of 15 mmHg and no PVL. He has NYHA  class II symptoms. His activity is mostly limited by severe back pain. SBE prophylaxis discussed; he has clinda at home. He remains on DAPT with ASA/plavix. He can stop plavix now and continue on ASA indefinitely.   CAD: pre TAVR cath showed patent bypass graft failure of the SVG--> ramus but patent SVG--> PDA and PL, patent LIMA--> LAD. Continue medical therapy with ASA, BB and statin  Chronic systolic CHF: EF improved to 50-55% by echo today. He is followed in the advanced CHF clinic by Dr. Aundra Dubin.   HTN: BP mildly elevated today. No changes made as he has many medication intolerances.   CKD: creat baseline 1.7--> 2.0  Pulmonary nodule: repeat CT scan in 11/2017 showed 6 mo stability; however, repeat CT scan was recommended in 1 year. I will have this arranged for 11/2018.  Medication Adjustments/Labs and Tests Ordered: Current medicines are reviewed at length with the patient today.  Concerns regarding medicines are outlined above.  Medication changes, Labs and Tests ordered today are listed in the Patient Instructions below. Patient Instructions  Medication Instructions:  Your physician has recommended you make the following change in your medication: Stop Clopidogrel.     Labwork: none  Testing/Procedures: Non-Cardiac CT scanning, (CAT scanning), is a noninvasive, special x-ray that produces cross-sectional images of the  body using x-rays and a computer. CT scans help physicians diagnose and treat medical conditions. For some CT exams, a contrast material is used to enhance visibility in the area of the body being studied. CT scans provide greater clarity and reveal more details than regular x-ray exams.  To be done in December 2019  Follow-Up: Follow up with Dr. Tamala Julian as planned on September 08, 2018  Any Other Special Instructions Will Be Listed Below (If Applicable).     If you need a refill on your cardiac medications before your next appointment, please call your  pharmacy.      Signed, Angelena Form, PA-C  07/14/2018 11:55 AM    [facility] [address], [place], [place]  [address] Phone: [phone]; Fax: [phone]

## 2018-07-13 NOTE — Patient Instructions (Addendum)
Medication Instructions:  Your physician has recommended you make the following change in your medication: Stop Clopidogrel.     Labwork: none  Testing/Procedures: Non-Cardiac CT scanning, (CAT scanning), is a noninvasive, special x-ray that produces cross-sectional images of the body using x-rays and a computer. CT scans help physicians diagnose and treat medical conditions. For some CT exams, a contrast material is used to enhance visibility in the area of the body being studied. CT scans provide greater clarity and reveal more details than regular x-ray exams.  To be done in December 2019  Follow-Up: Follow up with Dr. Tamala Julian as planned on September 08, 2018  Any Other Special Instructions Will Be Listed Below (If Applicable).     If you need a refill on your cardiac medications before your next appointment, please call your pharmacy.

## 2018-07-25 ENCOUNTER — Encounter: Payer: Self-pay | Admitting: Thoracic Surgery (Cardiothoracic Vascular Surgery)

## 2018-08-03 DIAGNOSIS — L570 Actinic keratosis: Secondary | ICD-10-CM | POA: Diagnosis not present

## 2018-08-03 DIAGNOSIS — L738 Other specified follicular disorders: Secondary | ICD-10-CM | POA: Diagnosis not present

## 2018-08-03 DIAGNOSIS — D1801 Hemangioma of skin and subcutaneous tissue: Secondary | ICD-10-CM | POA: Diagnosis not present

## 2018-08-03 DIAGNOSIS — L821 Other seborrheic keratosis: Secondary | ICD-10-CM | POA: Diagnosis not present

## 2018-08-03 DIAGNOSIS — D225 Melanocytic nevi of trunk: Secondary | ICD-10-CM | POA: Diagnosis not present

## 2018-08-03 DIAGNOSIS — Z85828 Personal history of other malignant neoplasm of skin: Secondary | ICD-10-CM | POA: Diagnosis not present

## 2018-08-08 ENCOUNTER — Other Ambulatory Visit (HOSPITAL_COMMUNITY): Payer: Self-pay | Admitting: Cardiology

## 2018-08-29 ENCOUNTER — Other Ambulatory Visit: Payer: Self-pay | Admitting: Nurse Practitioner

## 2018-08-29 ENCOUNTER — Ambulatory Visit
Admission: RE | Admit: 2018-08-29 | Discharge: 2018-08-29 | Disposition: A | Discharge status: Home / Self Care | Payer: PPO | Source: Ambulatory Visit | Attending: Nurse Practitioner | Admitting: Nurse Practitioner

## 2018-08-29 DIAGNOSIS — M47816 Spondylosis without myelopathy or radiculopathy, lumbar region: Secondary | ICD-10-CM | POA: Diagnosis not present

## 2018-08-29 DIAGNOSIS — M546 Pain in thoracic spine: Secondary | ICD-10-CM | POA: Diagnosis not present

## 2018-09-03 ENCOUNTER — Encounter

## 2018-09-07 NOTE — Progress Notes (Signed)
Cardiology Office Note:    Date:  09/08/2018   ID:  [name], DOB 1934/03/06, MRN [id number]  PCP:  Lajean Manes, MD  Cardiologist:  No primary care provider on file.   Referring MD: Lajean Manes, MD   Chief Complaint  Patient presents with  . Coronary Artery Disease  . Cardiac Valve Problem    History of Present Illness:    [name] is a 84 y.o. 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  Feels tired all the time.  Not having angina.  Denies orthopnea.  Does have dyspnea on exertion.  Very limited exertional capacity due to chronic back pain and neuropathy.    Past Medical History:  Diagnosis Date  . Chondromalacia   . Chronic systolic CHF (congestive heart failure) ([place])   . Coronary artery disease    a. 2007: s/p CABG x3V (LIMA--> LAD, SVG--> PDA, SVG--> ramus)  c. 2017: LHC with 2/3 patent bypass grafts with occuluded SVG--> Ramus   . Diabetes mellitus ([place])   . GERD (gastroesophageal reflux disease)   . Glaucoma    "high normal"  . Gout   . H/O blood clots    R Lower leg, right upper leg  . History of renal cell cancer   . Hyperlipidemia   . Hypertension   . Joint pain   . Low back pain    "severe"  . Obesity   . Osteoporosis   . Polymyalgia ([place])    h/o  . Prostate cancer ([place])   . Severe aortic stenosis    a. 06/2017: s/p TAVR  . Spinal stenosis   . Thrombophlebitis   . Tubular adenoma     Past Surgical History:  Procedure Laterality Date  . APPENDECTOMY  60 years ago  . BACK SURGERY    . CARDIAC CATHETERIZATION    . CARDIAC CATHETERIZATION N/A 11/03/2016   Procedure: Right/Left Heart Cath and Coronary/Graft Angiography;  Surgeon: Belva Crome, MD;  Location: [facility];  Service: Cardiovascular;  Laterality: N/A;  . CORONARY ARTERY BYPASS GRAFT  2007  . EYE SURGERY Right    /w IOL,  post cataracts   . PROSTATE SURGERY     seed implant  . ROBOTIC ASSITED PARTIAL NEPHRECTOMY Left 08/31/2013   Procedure: ROBOTIC ASSITED PARTIAL NEPHRECTOMY;  Surgeon: Dutch Gray, MD;  Location: [facility];  Service: Urology;  Laterality: Left;  . TEE WITHOUT CARDIOVERSION N/A 06/22/2017   Procedure: TRANSESOPHAGEAL ECHOCARDIOGRAM (TEE);  Surgeon: Sherren Mocha, MD;  Location: [facility];  Service: Open Heart Surgery;  Laterality: N/A;  . TRANSCATHETER AORTIC VALVE REPLACEMENT, TRANSFEMORAL N/A 06/22/2017   Procedure: TRANSCATHETER AORTIC VALVE REPLACEMENT, TRANSFEMORAL;  Surgeon: Sherren Mocha, MD;  Location: [facility];  Service: Open Heart Surgery;  Laterality: N/A;  . VEIN LIGATION AND STRIPPING Right 1972   right lower leg    Current Medications: Current Meds  Medication Sig  . alendronate (FOSAMAX) 70 MG tablet Take 70 mg by mouth once a week. Take with a full glass of water on an empty stomach.  Marland Kitchen allopurinol (ZYLOPRIM) 300 MG tablet Take 300 mg by mouth daily.  Marland Kitchen aspirin 81 MG chewable tablet Chew 81 mg by mouth every evening.   . Calcium Carbonate-Vitamin D 600-200 MG-UNIT TABS Take 1 tablet by mouth daily.  . carvedilol (COREG) 12.5 MG tablet Take  one (1) tablet (12.5 mg) by mouth each morning and half (1/2) tablet (6.25 mg) by mouth each evening.  . clindamycin (CLEOCIN) 300 MG capsule Take 2 capsules by mouth one hour prior to dental appointment  . glipiZIDE (GLUCOTROL) 10 MG tablet Take 5-10 mg by mouth 2 (two) times daily before a meal. 10 mg in the morning and 5 mg in the evening  . HYDROcodone-acetaminophen (NORCO) 10-325 MG tablet Take 1-2 tablets by mouth every 6 (six) hours as needed (pain).  . Multiple Vitamin (MULTIVITAMIN) tablet Take 1 tablet by mouth daily.  . predniSONE (DELTASONE) 1 MG tablet Take 5 mg by mouth daily with breakfast.  . rosuvastatin (CRESTOR) 10 MG tablet Take 10 mg by mouth daily.  . tamsulosin (FLOMAX) 0.4 MG CAPS capsule Take 0.4 mg by mouth daily after  supper.  . torsemide (DEMADEX) 20 MG tablet Take 1 tablet (20 mg total) by mouth 2 (two) times a week. On Monday and Friday     Allergies:   Fish allergy; Lipitor [atorvastatin]; Penicillins; Simvastatin; Adacel [tetanus-diphth-acell pertussis]; Lasix [furosemide]; Septra [sulfamethoxazole-trimethoprim]; Flexeril [cyclobenzaprine]; and Pantoprazole   Social History   Socioeconomic History  . Marital status: Married    Spouse name: Not on file  . Number of children: Not on file  . Years of education: Not on file  . Highest education level: Not on file  Occupational History  . Not on file  Social Needs  . Financial resource strain: Not on file  . Food insecurity:    Worry: Not on file    Inability: Not on file  . Transportation needs:    Medical: Not on file    Non-medical: Not on file  Tobacco Use  . Smoking status: Former Smoker    Packs/day: 3.00    Years: 30.00    Pack years: 90.00    Types: Cigarettes    Last attempt to quit: 12/21/1980    Years since quitting: 37.7  . Smokeless tobacco: Never Used  Substance and Sexual Activity  . Alcohol use: No  . Drug use: No  . Sexual activity: Not on file  Lifestyle  . Physical activity:    Days per week: Not on file    Minutes per session: Not on file  . Stress: Not on file  Relationships  . Social connections:    Talks on phone: Not on file    Gets together: Not on file    Attends religious service: Not on file    Active member of club or organization: Not on file    Attends meetings of clubs or organizations: Not on file    Relationship status: Not on file  Other Topics Concern  . Not on file  Social History Narrative  . Not on file     Family History: The patient's family history includes Heart Problems in his father and mother.  ROS:   Please see the history of present illness.    Easy bruising, difficulty with balance and difficulty urinating.  Constipation.  Excessive fatigue.  Denies snoring.  States he  sleeps okay.  All other systems reviewed and are negative.  EKGs/Labs/Other Studies Reviewed:    The following studies were reviewed today:  2D Doppler echocardiogram July 13, 2018: Study Conclusions   - Left ventricle: The cavity size was normal. Wall thickness was   increased in a pattern of moderate LVH. Systolic function was   normal. The estimated ejection fraction was in the range of 50%  to 55%. Wall motion was normal; there were no regional wall   motion abnormalities. Doppler parameters are consistent with   abnormal left ventricular relaxation (grade 1 diastolic   dysfunction). The E/e&' ratio is between 8-15, suggesting   indeterminate LV filling pressure. - Aortic valve: s/p Edwards Sapien 3 THV. No obstruction or   paravalvular leak. Mean gradient (S): 15 mm Hg. Peak gradient   (S): 31 mm Hg. Valve area (VTI): 1.27 cm^2. Valve area (Vmax):   1.25 cm^2. Valve area (Vmean): 1.33 cm^2. - Mitral valve: Calcified annulus. There was mild regurgitation. - Left atrium: The atrium was normal in size. - Inferior vena cava: The vessel was normal in size. The   respirophasic diameter changes were in the normal range (>= 50%),   consistent with normal central venous pressure.   Impressions:   - Compared to a prior study in 10/2017, the LVEF has improved to   50-55%. The TAVR gradient is increased to 15 mmHg mean.   EKG:  EKG is  ordered today.  The ekg ordered today demonstrates normal sinus rhythm with nonspecific interventricular conduction delay suggesting left bundle branch block, atypical.  Recent Labs: 05/12/2018: BUN 33; Creatinine, Ser 1.77; Potassium 4.3; Sodium 144  Recent Lipid Panel    Component Value Date/Time   CHOL 163 10/06/2017 1441   TRIG 113 10/06/2017 1441   HDL 69 10/06/2017 1441   CHOLHDL 2.4 10/06/2017 1441   VLDL 23 10/06/2017 1441   LDLCALC 71 10/06/2017 1441    Physical Exam:    VS:  BP 118/64   Pulse 63   Ht 5\' 6"  (1.676 m)   Wt 198 lb  (89.8 kg)   BMI 31.96 kg/m     Wt Readings from Last 3 Encounters:  09/08/18 198 lb (89.8 kg)  07/13/18 196 lb (88.9 kg)  05/12/18 194 lb (88 kg)     GEN: Obese.  Age-appropriate to appearance.  Well nourished, well developed in no acute distress HEENT: Normal NECK: No JVD. LYMPHATICS: No lymphadenopathy CARDIAC: RRR, 2/6 systolic without diastolic right upper sternal border murmur, no gallop, trace bilateral ankle edema. VASCULAR: 2+ radial and carotid bilateral pulses.  No bruits. RESPIRATORY:  Clear to auscultation without rales, wheezing or rhonchi  ABDOMEN: Soft, non-tender, non-distended, No pulsatile mass, MUSCULOSKELETAL: No deformity  SKIN: Warm and dry NEUROLOGIC:  Alert and oriented x 3 PSYCHIATRIC:  Normal affect   ASSESSMENT:    1. S/P TAVR (transcatheter aortic valve replacement)   2. Coronary artery disease involving coronary bypass graft of native heart without angina pectoris   3. Chronic systolic CHF (congestive heart failure) ([place])   4. CKD (chronic kidney disease), stage III ([place])   5. Pure hypercholesterolemia   6. Type 2 diabetes mellitus with other circulatory complication, with long-term current use of insulin ([facility])    PLAN:    In order of problems listed above:  1. Status post TAVR for critical aortic stenosis.  Very stable with most recent echo performed in July demonstrating continued normal valve function.  EF is improved to 50% from initial EF of 25% prior to TAVR.  He is physically inactive due to chronic back discomfort.  He notes no significant improvement in his overall exertional tolerance because of deconditioning. 2. Stable without angina.  LDL target less than 70, blood pressure 130/80 mmHg or less, and hemoglobin A1c less than 7. 3. As noted above HFrEF transition to HFpEF. 4. Not addressed 5. LDL target less than 70 6.  Hemoglobin A1c target less than 7.  Clinical follow-up in 9 to 12 months.  No change in therapy.   Medication  Adjustments/Labs and Tests Ordered: Current medicines are reviewed at length with the patient today.  Concerns regarding medicines are outlined above.  Orders Placed This Encounter  Procedures  . EKG 12-Lead   No orders of the defined types were placed in this encounter.   Patient Instructions  Medication Instructions:  Your physician recommends that you continue on your current medications as directed. Please refer to the Current Medication list given to you today.  Labwork: None   Testing/Procedures: None   Follow-Up: Your physician wants you to follow-up in 9 - 12 months with Dr. Tamala Julian. You will receive a reminder letter in the mail two months in advance. If you don't receive a letter, please call our office to schedule the follow-up appointment.   Any Other Special Instructions Will Be Listed Below (If Applicable).     If you need a refill on your cardiac medications before your next appointment, please call your pharmacy.      Signed, Sinclair Grooms, MD  09/08/2018 5:21 PM    [place]

## 2018-09-08 ENCOUNTER — Encounter

## 2018-09-08 ENCOUNTER — Encounter: Payer: Self-pay | Admitting: Interventional Cardiology

## 2018-09-08 ENCOUNTER — Ambulatory Visit: Payer: PPO | Admitting: Interventional Cardiology

## 2018-09-08 VITALS — BP 118/64 | HR 63 | Ht 66.0 in | Wt 198.0 lb

## 2018-09-08 DIAGNOSIS — E7800 Pure hypercholesterolemia, unspecified: Secondary | ICD-10-CM

## 2018-09-08 DIAGNOSIS — Z794 Long term (current) use of insulin: Secondary | ICD-10-CM

## 2018-09-08 DIAGNOSIS — N183 Chronic kidney disease, stage 3 unspecified: Secondary | ICD-10-CM

## 2018-09-08 DIAGNOSIS — E1159 Type 2 diabetes mellitus with other circulatory complications: Secondary | ICD-10-CM | POA: Diagnosis not present

## 2018-09-08 DIAGNOSIS — Z952 Presence of prosthetic heart valve: Secondary | ICD-10-CM | POA: Diagnosis not present

## 2018-09-08 DIAGNOSIS — I5022 Chronic systolic (congestive) heart failure: Secondary | ICD-10-CM | POA: Diagnosis not present

## 2018-09-08 DIAGNOSIS — I25810 Atherosclerosis of coronary artery bypass graft(s) without angina pectoris: Secondary | ICD-10-CM

## 2018-09-08 NOTE — Patient Instructions (Signed)
Medication Instructions:  Your physician recommends that you continue on your current medications as directed. Please refer to the Current Medication list given to you today.  Labwork: None  Testing/Procedures: None  Follow-Up: Your physician wants you to follow-up in: 9-12 months with Dr. Smith.  You will receive a reminder letter in the mail two months in advance. If you don't receive a letter, please call our office to schedule the follow-up appointment.   Any Other Special Instructions Will Be Listed Below (If Applicable).     If you need a refill on your cardiac medications before your next appointment, please call your pharmacy.   

## 2018-09-28 DIAGNOSIS — N183 Chronic kidney disease, stage 3 (moderate): Secondary | ICD-10-CM | POA: Diagnosis not present

## 2018-09-28 DIAGNOSIS — E669 Obesity, unspecified: Secondary | ICD-10-CM | POA: Diagnosis not present

## 2018-09-28 DIAGNOSIS — E7800 Pure hypercholesterolemia, unspecified: Secondary | ICD-10-CM | POA: Diagnosis not present

## 2018-09-28 DIAGNOSIS — E1142 Type 2 diabetes mellitus with diabetic polyneuropathy: Secondary | ICD-10-CM | POA: Diagnosis not present

## 2018-09-28 DIAGNOSIS — D696 Thrombocytopenia, unspecified: Secondary | ICD-10-CM | POA: Diagnosis not present

## 2018-09-28 DIAGNOSIS — Z79899 Other long term (current) drug therapy: Secondary | ICD-10-CM | POA: Diagnosis not present

## 2018-09-28 DIAGNOSIS — I700 Atherosclerosis of aorta: Secondary | ICD-10-CM | POA: Diagnosis not present

## 2018-09-28 DIAGNOSIS — E1121 Type 2 diabetes mellitus with diabetic nephropathy: Secondary | ICD-10-CM | POA: Diagnosis not present

## 2018-09-28 DIAGNOSIS — Z23 Encounter for immunization: Secondary | ICD-10-CM | POA: Diagnosis not present

## 2018-09-28 DIAGNOSIS — I5022 Chronic systolic (congestive) heart failure: Secondary | ICD-10-CM | POA: Diagnosis not present

## 2018-09-28 DIAGNOSIS — Z0000 Encounter for general adult medical examination without abnormal findings: Secondary | ICD-10-CM | POA: Diagnosis not present

## 2018-09-28 DIAGNOSIS — Z1389 Encounter for screening for other disorder: Secondary | ICD-10-CM | POA: Diagnosis not present

## 2018-10-14 ENCOUNTER — Other Ambulatory Visit (HOSPITAL_COMMUNITY): Payer: Self-pay | Admitting: Cardiology

## 2018-11-08 ENCOUNTER — Ambulatory Visit
Admission: RE | Admit: 2018-11-08 | Discharge: 2018-11-08 | Disposition: A | Discharge status: Home / Self Care | Payer: PPO | Source: Ambulatory Visit | Attending: Geriatric Medicine | Admitting: Geriatric Medicine

## 2018-11-08 ENCOUNTER — Other Ambulatory Visit: Payer: Self-pay | Admitting: Geriatric Medicine

## 2018-11-08 DIAGNOSIS — R0781 Pleurodynia: Secondary | ICD-10-CM | POA: Diagnosis not present

## 2018-11-08 DIAGNOSIS — R52 Pain, unspecified: Secondary | ICD-10-CM

## 2018-11-08 DIAGNOSIS — R0789 Other chest pain: Secondary | ICD-10-CM | POA: Diagnosis not present

## 2018-11-08 DIAGNOSIS — M546 Pain in thoracic spine: Secondary | ICD-10-CM | POA: Diagnosis not present

## 2018-11-08 DIAGNOSIS — S299XXA Unspecified injury of thorax, initial encounter: Secondary | ICD-10-CM | POA: Diagnosis not present

## 2018-11-09 DIAGNOSIS — N401 Enlarged prostate with lower urinary tract symptoms: Secondary | ICD-10-CM | POA: Diagnosis not present

## 2018-11-16 DIAGNOSIS — N200 Calculus of kidney: Secondary | ICD-10-CM | POA: Diagnosis not present

## 2018-11-16 DIAGNOSIS — C642 Malignant neoplasm of left kidney, except renal pelvis: Secondary | ICD-10-CM | POA: Diagnosis not present

## 2018-11-19 IMAGING — CT CT HEART MORP W/ CTA COR W/ SCORE W/ CA W/CM &/OR W/O CM
1 of 6 series · 13 of 20 positions shown, 17 images · IV contrast (OMNI 350)
Comparison: 06/03/2017

CLINICAL DATA: 82-year-old male with severe aortic stenosis being
evaluated for TAVR.

EXAM:
Cardiac TAVR CT
TECHNIQUE: The patient was scanned on a Philips 256 scanner. A 120 kV
retrospective scan was triggered in the descending thoracic aorta at
111 HU's. Gantry rotation speed was 270 msecs and collimation was .9
mm. 10 mg of iv Metoprolol and no nitro were given. The 3D data set
was reconstructed in 5% intervals of the R-R cycle. Systolic and
diastolic phases were analyzed on a dedicated work station using
MPR, MIP and VRT modes. The patient received 80 cc of contrast.

[Series 10: corcta 0.6 i26f 2 40 - 80 % · axial · 0.36mm/px · z∈[+1174,+1307]mm · 13 of 3483 slices shown, 17 images]
[im 249/3483  vessel]
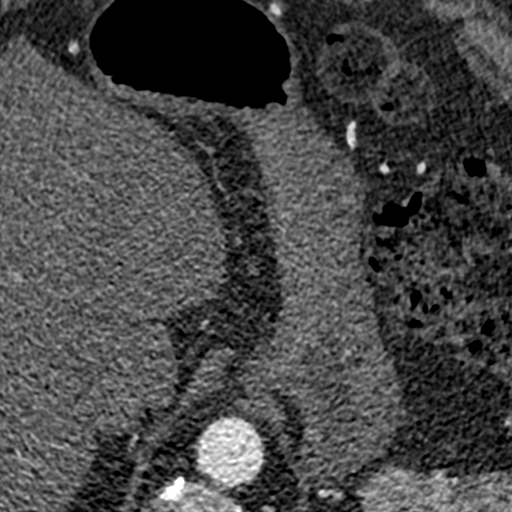
[im 249/3483  lung]
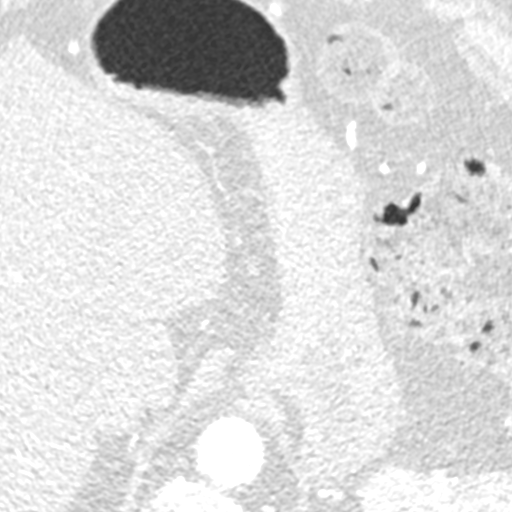
[im 498/3483  vessel]
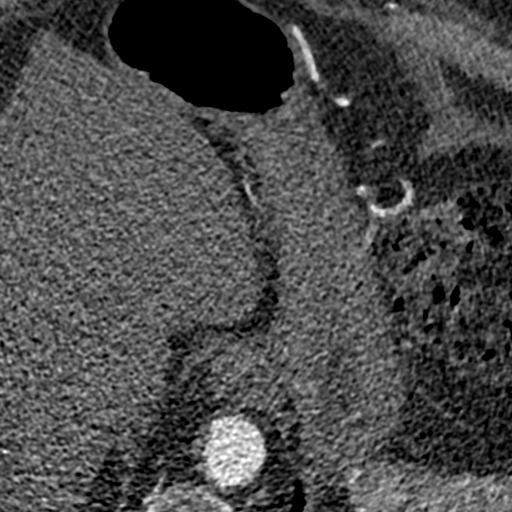
[im 747/3483  vessel]
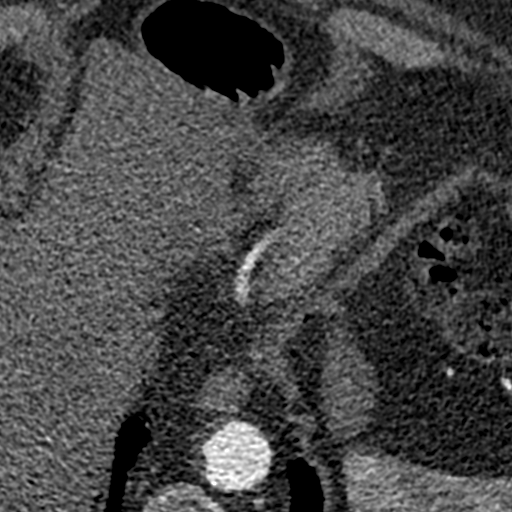
[im 995/3483  vessel]
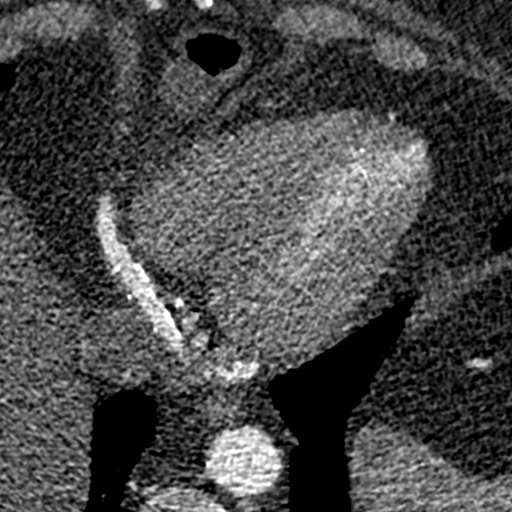
[im 1244/3483  vessel]
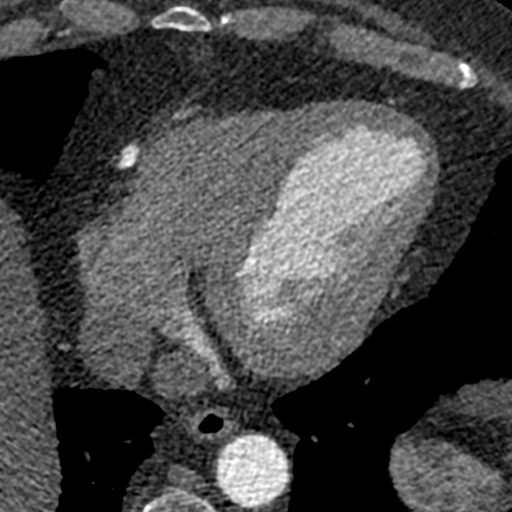
[im 1244/3483  lung]
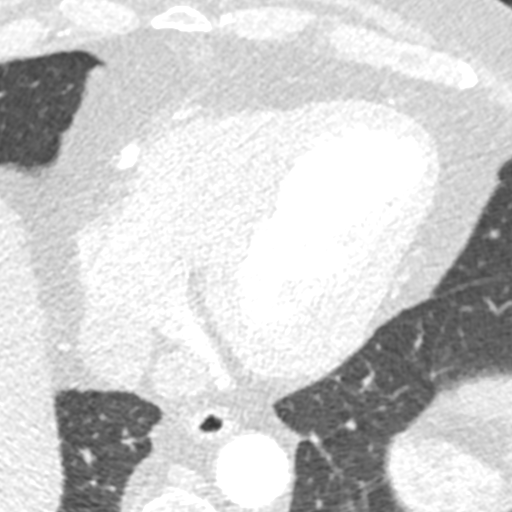
[im 1493/3483  vessel]
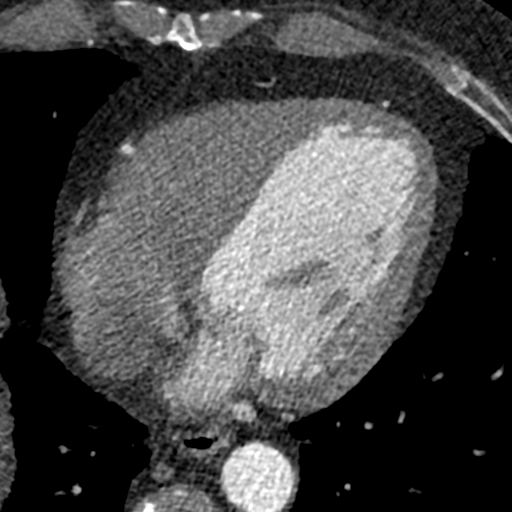
[im 1742/3483  vessel]
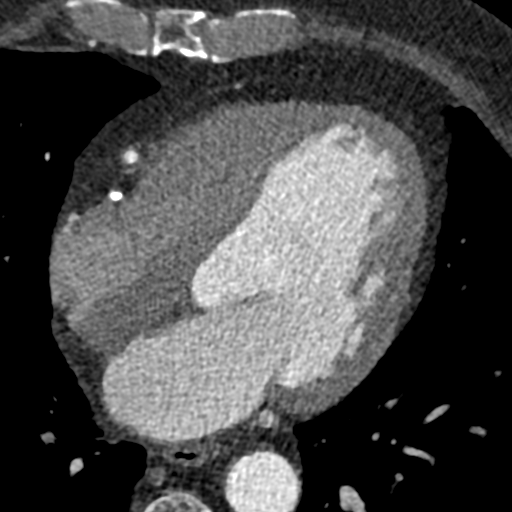
[im 1990/3483  vessel]
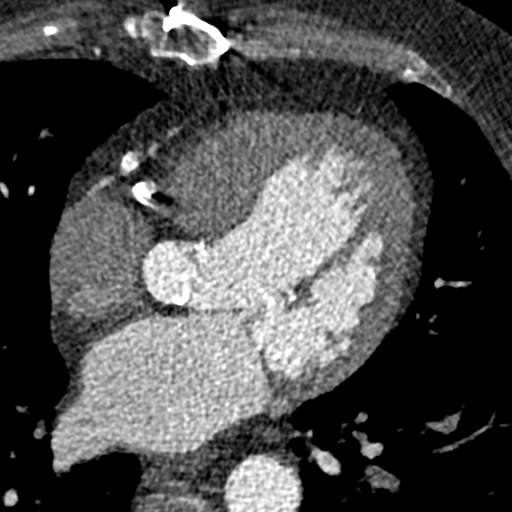
[im 2239/3483  vessel]
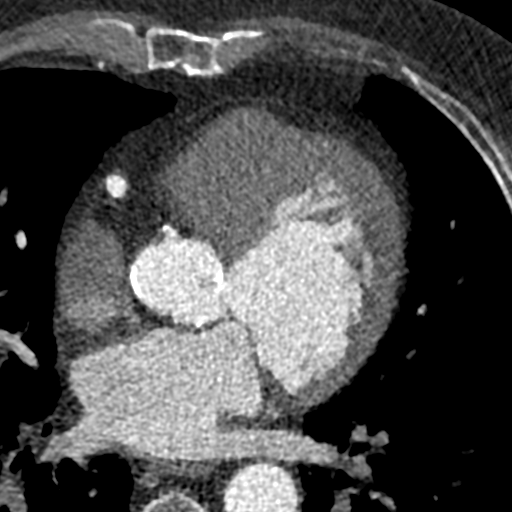
[im 2239/3483  lung]
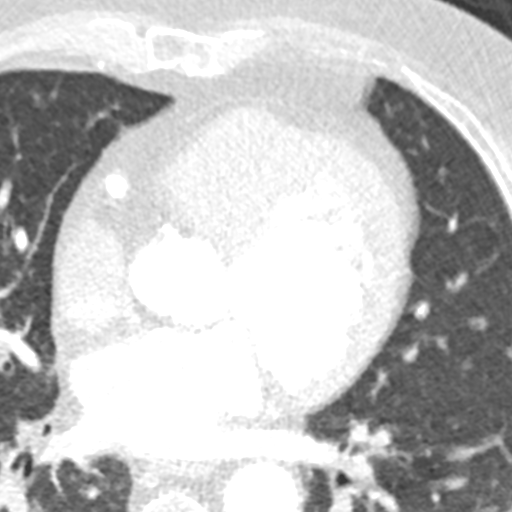
[im 2488/3483  vessel]
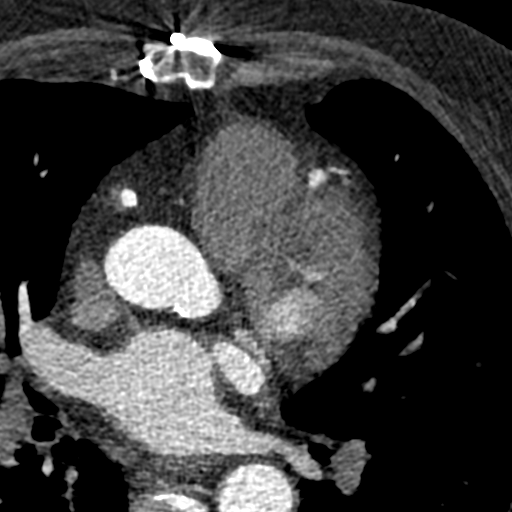
[im 2736/3483  vessel]
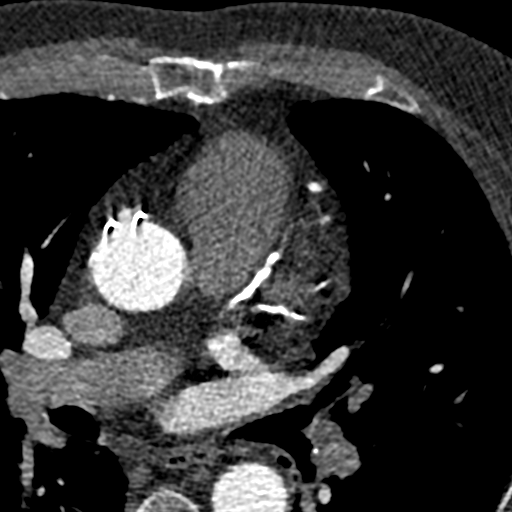
[im 2985/3483  vessel]
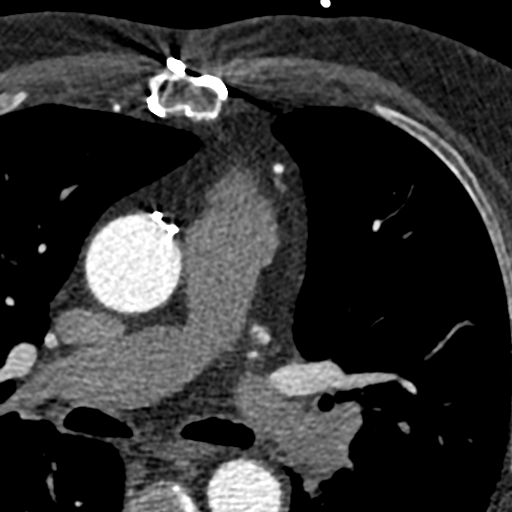
[im 3234/3483  vessel]
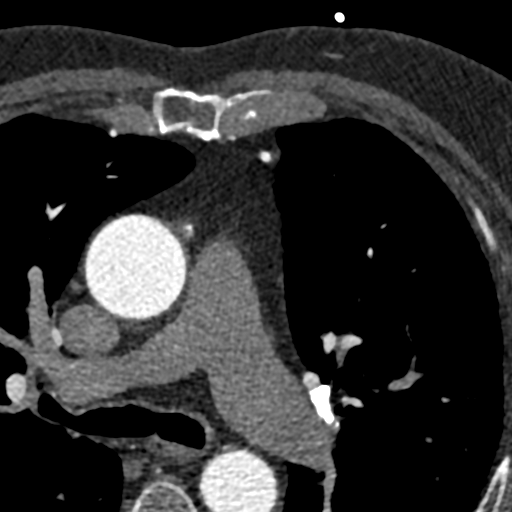
[im 3234/3483  lung]
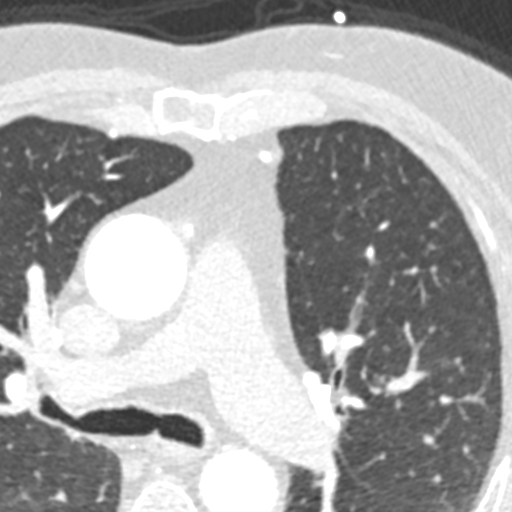

[13 of 20 positions shown; findings below may reference images not displayed]

FINDINGS: Aortic Valve: Trileaflet, moderately thickened and calcified aortic
valve with severely restricted leaflet opening. No calcifications
extending into the LVOT. LVOT is large.

Aorta:  Normal size, trivial calcifications.  No dissection.

Sinotubular Junction:  30 x 28 mm

Ascending Thoracic Aorta:  37 x 36 mm

Aortic Arch:  Not visualized

Descending Thoracic Aorta:  26 x 23 mm

Sinus of Valsalva Measurements:

Non-coronary:  34 mm

Right -coronary:  29 mm

Left -coronary:  34 mm

Coronary Artery Height above Annulus:

Left Main:  14 mm

Right Coronary:  13 mm

Virtual Basal Annulus Measurements:

Maximum/Minimum Diameter:  28 x 24 mm

Perimeter:  84 mm

Area:  535 mm2

Optimum Fluoroscopic Angle for Delivery:  LAO 0 BLIND KA 0
IMPRESSION: 1. Trileaflet, moderately thickened and calcified aortic valve with
severely restricted leaflet opening. No calcifications extending
into the LVOT. LVOT is large and makes annular sizing challenging.
Annular measurements suitable for a delivery of a 26 mm
Edwards-SAPIEN 3 valve.

2.  Sufficient annulus to coronary distance.

3. Optimum Fluoroscopic Angle for Delivery:  LAO 0 BLIND KA 0

4.  No thrombus in the left atrial appendage.

Sten-Inge Paues

EXAM:
OVER-READ INTERPRETATION  CT CHEST

The following report is an over-read performed by radiologist Dr.
Kiri Jim [REDACTED] on 06/11/2017. This over-read
does not include interpretation of cardiac or coronary anatomy or
pathology. The coronary CTA interpretation by the cardiologist is
attached.
FINDINGS: Cardiovascular: Heart is normal size. Extensive calcifications
throughout the aorta which is normal caliber. No dissection.

Mediastinum/Nodes: No adenopathy in the visualized lower mediastinum
or hila. Calcified left hilar lymph nodes.

Lungs/Pleura: Visualized lungs are clear.  No effusions.

Upper Abdomen: Imaging into the upper abdomen shows no acute
findings.

Musculoskeletal: No acute bony abnormality. Chest wall soft tissues
unremarkable. Prior median sternotomy and CABG.
IMPRESSION: No acute or significant extracardiac abnormality.

## 2018-11-28 DIAGNOSIS — C642 Malignant neoplasm of left kidney, except renal pelvis: Secondary | ICD-10-CM | POA: Diagnosis not present

## 2018-11-28 DIAGNOSIS — C61 Malignant neoplasm of prostate: Secondary | ICD-10-CM | POA: Diagnosis not present

## 2018-11-29 DIAGNOSIS — M47816 Spondylosis without myelopathy or radiculopathy, lumbar region: Secondary | ICD-10-CM | POA: Diagnosis not present

## 2018-12-01 IMAGING — DX DG CHEST 1V PORT
1 series · 1 of 1 positions shown · non-contrast
Comparison: 06/22/2017

CLINICAL DATA: TAVR.

EXAM:
PORTABLE CHEST 1 VIEW

[chest ap]
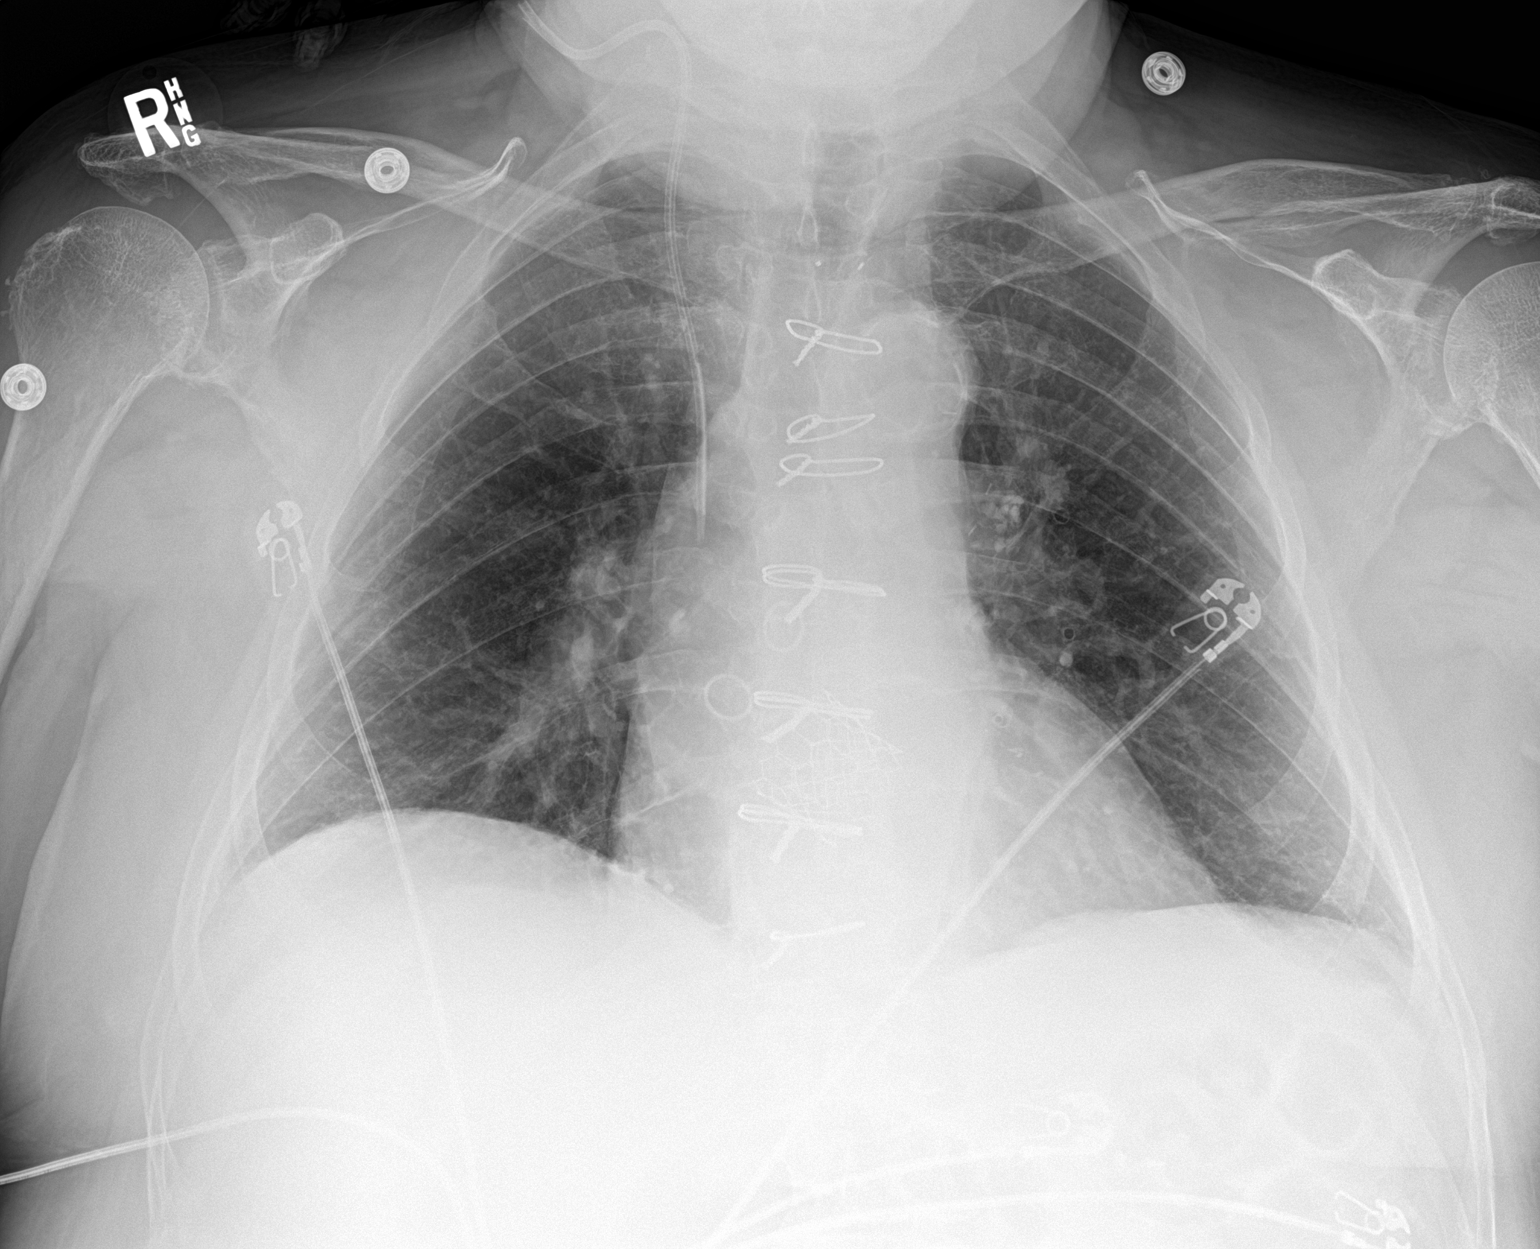

[1 of 1 positions shown; findings below may reference images not displayed]

FINDINGS: Sternotomy wires and right IJ central venous catheter unchanged.
Evidence of previous aortic valve repair unchanged. Lungs are
adequately inflated and otherwise clear. Cardiomediastinal
silhouette and remainder of the exam is unchanged.
IMPRESSION: No acute cardiopulmonary disease.

Right IJ central venous catheter unchanged.

## 2018-12-08 ENCOUNTER — Ambulatory Visit (INDEPENDENT_AMBULATORY_CARE_PROVIDER_SITE_OTHER)
Admission: RE | Admit: 2018-12-08 | Discharge: 2018-12-08 | Disposition: A | Discharge status: Home / Self Care | Payer: PPO | Source: Ambulatory Visit | Attending: Physician Assistant | Admitting: Physician Assistant

## 2018-12-08 DIAGNOSIS — R911 Solitary pulmonary nodule: Secondary | ICD-10-CM

## 2018-12-08 DIAGNOSIS — J984 Other disorders of lung: Secondary | ICD-10-CM | POA: Diagnosis not present

## 2018-12-19 DIAGNOSIS — H5203 Hypermetropia, bilateral: Secondary | ICD-10-CM | POA: Diagnosis not present

## 2018-12-19 DIAGNOSIS — H2512 Age-related nuclear cataract, left eye: Secondary | ICD-10-CM | POA: Diagnosis not present

## 2018-12-19 DIAGNOSIS — H40053 Ocular hypertension, bilateral: Secondary | ICD-10-CM | POA: Diagnosis not present

## 2018-12-19 DIAGNOSIS — E119 Type 2 diabetes mellitus without complications: Secondary | ICD-10-CM | POA: Diagnosis not present

## 2018-12-30 DIAGNOSIS — D696 Thrombocytopenia, unspecified: Secondary | ICD-10-CM | POA: Diagnosis not present

## 2018-12-30 DIAGNOSIS — M545 Low back pain: Secondary | ICD-10-CM | POA: Diagnosis not present

## 2018-12-30 DIAGNOSIS — G8929 Other chronic pain: Secondary | ICD-10-CM | POA: Diagnosis not present

## 2018-12-30 DIAGNOSIS — M353 Polymyalgia rheumatica: Secondary | ICD-10-CM | POA: Diagnosis not present

## 2018-12-30 DIAGNOSIS — N183 Chronic kidney disease, stage 3 (moderate): Secondary | ICD-10-CM | POA: Diagnosis not present

## 2018-12-30 DIAGNOSIS — I700 Atherosclerosis of aorta: Secondary | ICD-10-CM | POA: Diagnosis not present

## 2018-12-30 DIAGNOSIS — E1142 Type 2 diabetes mellitus with diabetic polyneuropathy: Secondary | ICD-10-CM | POA: Diagnosis not present

## 2018-12-30 DIAGNOSIS — E7800 Pure hypercholesterolemia, unspecified: Secondary | ICD-10-CM | POA: Diagnosis not present

## 2018-12-30 DIAGNOSIS — E669 Obesity, unspecified: Secondary | ICD-10-CM | POA: Diagnosis not present

## 2018-12-30 DIAGNOSIS — E1121 Type 2 diabetes mellitus with diabetic nephropathy: Secondary | ICD-10-CM | POA: Diagnosis not present

## 2019-01-28 ENCOUNTER — Other Ambulatory Visit (HOSPITAL_COMMUNITY): Payer: Self-pay | Admitting: Cardiology

## 2019-02-08 DIAGNOSIS — L821 Other seborrheic keratosis: Secondary | ICD-10-CM | POA: Diagnosis not present

## 2019-02-08 DIAGNOSIS — D2261 Melanocytic nevi of right upper limb, including shoulder: Secondary | ICD-10-CM | POA: Diagnosis not present

## 2019-02-08 DIAGNOSIS — D2272 Melanocytic nevi of left lower limb, including hip: Secondary | ICD-10-CM | POA: Diagnosis not present

## 2019-02-08 DIAGNOSIS — D1801 Hemangioma of skin and subcutaneous tissue: Secondary | ICD-10-CM | POA: Diagnosis not present

## 2019-02-08 DIAGNOSIS — Z85828 Personal history of other malignant neoplasm of skin: Secondary | ICD-10-CM | POA: Diagnosis not present

## 2019-02-08 DIAGNOSIS — L570 Actinic keratosis: Secondary | ICD-10-CM | POA: Diagnosis not present

## 2019-02-08 DIAGNOSIS — D225 Melanocytic nevi of trunk: Secondary | ICD-10-CM | POA: Diagnosis not present

## 2019-02-08 DIAGNOSIS — C44622 Squamous cell carcinoma of skin of right upper limb, including shoulder: Secondary | ICD-10-CM | POA: Diagnosis not present

## 2019-02-16 DIAGNOSIS — G894 Chronic pain syndrome: Secondary | ICD-10-CM | POA: Diagnosis not present

## 2019-02-16 DIAGNOSIS — N183 Chronic kidney disease, stage 3 (moderate): Secondary | ICD-10-CM | POA: Diagnosis not present

## 2019-02-16 DIAGNOSIS — D692 Other nonthrombocytopenic purpura: Secondary | ICD-10-CM | POA: Diagnosis not present

## 2019-02-16 DIAGNOSIS — E1121 Type 2 diabetes mellitus with diabetic nephropathy: Secondary | ICD-10-CM | POA: Diagnosis not present

## 2019-02-16 DIAGNOSIS — M545 Low back pain: Secondary | ICD-10-CM | POA: Diagnosis not present

## 2019-02-16 DIAGNOSIS — M353 Polymyalgia rheumatica: Secondary | ICD-10-CM | POA: Diagnosis not present

## 2019-03-29 DIAGNOSIS — I5022 Chronic systolic (congestive) heart failure: Secondary | ICD-10-CM | POA: Diagnosis not present

## 2019-03-29 DIAGNOSIS — E1142 Type 2 diabetes mellitus with diabetic polyneuropathy: Secondary | ICD-10-CM | POA: Diagnosis not present

## 2019-03-29 DIAGNOSIS — E1121 Type 2 diabetes mellitus with diabetic nephropathy: Secondary | ICD-10-CM | POA: Diagnosis not present

## 2019-03-29 DIAGNOSIS — M353 Polymyalgia rheumatica: Secondary | ICD-10-CM | POA: Diagnosis not present

## 2019-03-29 DIAGNOSIS — N183 Chronic kidney disease, stage 3 (moderate): Secondary | ICD-10-CM | POA: Diagnosis not present

## 2019-03-29 DIAGNOSIS — D696 Thrombocytopenia, unspecified: Secondary | ICD-10-CM | POA: Diagnosis not present

## 2019-03-29 DIAGNOSIS — Z79899 Other long term (current) drug therapy: Secondary | ICD-10-CM | POA: Diagnosis not present

## 2019-05-03 ENCOUNTER — Other Ambulatory Visit (HOSPITAL_COMMUNITY): Payer: Self-pay | Admitting: Cardiology

## 2019-05-09 ENCOUNTER — Telehealth: Payer: Self-pay | Admitting: Interventional Cardiology

## 2019-05-09 ENCOUNTER — Other Ambulatory Visit (HOSPITAL_COMMUNITY): Payer: Self-pay | Admitting: *Deleted

## 2019-05-09 MED ORDER — CARVEDILOL 12.5 MG PO TABS: 12.5000 mg | ORAL_TABLET | Freq: Two times a day (BID) | ORAL | 0 refills | Status: DC

## 2019-05-09 NOTE — Telephone Encounter (Signed)
New Message     *STAT* If patient is at the pharmacy, call can be transferred to refill team.   1. Which medications need to be refilled? (please list name of each medication and dose if known) Carvedilol 12.5 mg   2. Which pharmacy/location (including street and city if local pharmacy) is medication to be sent to? [facility] [place]   3. Do they need a 30 day or 90 day supply? 90 day   Pt said Dr Aundra Dubin was prescribing this medication and he needs a new script to be sent

## 2019-05-09 NOTE — Telephone Encounter (Signed)
This is a CHF pt, Dr. Aundra Dubin. Please address

## 2019-05-12 ENCOUNTER — Other Ambulatory Visit (HOSPITAL_COMMUNITY): Payer: Self-pay | Admitting: *Deleted

## 2019-05-12 MED ORDER — CARVEDILOL 12.5 MG PO TABS: 12.5000 mg | ORAL_TABLET | Freq: Two times a day (BID) | ORAL | 3 refills | Status: DC

## 2019-05-15 ENCOUNTER — Other Ambulatory Visit (HOSPITAL_COMMUNITY): Payer: Self-pay | Admitting: Cardiology

## 2019-06-15 ENCOUNTER — Telehealth: Payer: Self-pay | Admitting: Interventional Cardiology

## 2019-06-15 NOTE — Telephone Encounter (Signed)
New Message ° ° ° ° °CONSENT FOR TELE-HEALTH VISIT - PLEASE REVIEW TO PATIENT ° °I hereby voluntarily request, consent and authorize [facility] and its employed or contracted physicians, physician assistants, nurse practitioners or other licensed health care professionals (the Practitioner), to provide me with telemedicine health care services (the "Services") as deemed necessary by the treating Practitioner. I acknowledge and consent to receive the Services by the Practitioner via telemedicine. I understand that the telemedicine visit will involve communicating with the Practitioner through live audiovisual communication technology and the disclosure of certain medical information by electronic transmission. I acknowledge that I have been given the opportunity to request an in-person assessment or other available alternative prior to the telemedicine visit and am voluntarily participating in the telemedicine visit. ° °I understand that I have the right to withhold or withdraw my consent to the use of telemedicine in the course of my care at any time, without affecting my right to future care or treatment, and that the Practitioner or I may terminate the ° °telemedicine visit at any time. I understand that I have the right to inspect all information obtained and/or recorded in the course of the telemedicine visit and may receive copies of available information for a reasonable fee. I understand that some of the potential risks of receiving the Services via telemedicine include: ° °Delay or interruption in medical evaluation due to technological equipment failure or disruption; ° °Information transmitted may not be sufficient (e.g. poor resolution of images) to allow for appropriate medical decision making by the Practitioner; and/or ° °In rare instances, security protocols could fail, causing a breach of personal health information. ° °Furthermore, I acknowledge that it is my responsibility to provide  information about my medical history, conditions and care that is complete and accurate to the best of my ability. I acknowledge that Practitioner's advice, recommendations, and/or decision may be based on factors not within their control, such as incomplete or inaccurate data provided by me or distortions of diagnostic images or specimens that may result from electronic transmissions. I understand that the practice of medicine is not an exact science and that Practitioner makes no warranties or guarantees regarding treatment outcomes. I acknowledge that I will receive a copy of this consent concurrently upon execution via email to the email address I last provided but may also request a printed copy by calling the office of [facility]. ° °I understand that my insurance will be billed for this visit. ° °I have read or had this consent read to me. ° °I understand the contents of this consent, which adequately explains the benefits and risks of the Services being provided via telemedicine. ° °I have been provided ample opportunity to ask questions regarding this consent and the Services and have had my questions answered to my satisfaction. ° °I give my informed consent for the services to be provided through the use of telemedicine in my medical care ° °By participating in this telemedicine visit I agree to the above °YES °

## 2019-06-15 NOTE — Progress Notes (Signed)
Done

## 2019-06-16 ENCOUNTER — Encounter: Payer: Self-pay | Admitting: *Deleted

## 2019-06-16 ENCOUNTER — Encounter: Payer: Self-pay | Admitting: Interventional Cardiology

## 2019-06-16 ENCOUNTER — Telehealth (INDEPENDENT_AMBULATORY_CARE_PROVIDER_SITE_OTHER): Payer: PPO | Admitting: Interventional Cardiology

## 2019-06-16 ENCOUNTER — Other Ambulatory Visit: Payer: Self-pay

## 2019-06-16 VITALS — BP 114/51 | HR 81 | Ht 66.0 in | Wt 192.0 lb

## 2019-06-16 DIAGNOSIS — Z794 Long term (current) use of insulin: Secondary | ICD-10-CM

## 2019-06-16 DIAGNOSIS — Z7189 Other specified counseling: Secondary | ICD-10-CM

## 2019-06-16 DIAGNOSIS — Z952 Presence of prosthetic heart valve: Secondary | ICD-10-CM | POA: Diagnosis not present

## 2019-06-16 DIAGNOSIS — I25810 Atherosclerosis of coronary artery bypass graft(s) without angina pectoris: Secondary | ICD-10-CM

## 2019-06-16 DIAGNOSIS — E7800 Pure hypercholesterolemia, unspecified: Secondary | ICD-10-CM

## 2019-06-16 DIAGNOSIS — I5022 Chronic systolic (congestive) heart failure: Secondary | ICD-10-CM

## 2019-06-16 DIAGNOSIS — N183 Chronic kidney disease, stage 3 unspecified: Secondary | ICD-10-CM

## 2019-06-16 DIAGNOSIS — E1159 Type 2 diabetes mellitus with other circulatory complications: Secondary | ICD-10-CM

## 2019-06-16 DIAGNOSIS — I10 Essential (primary) hypertension: Secondary | ICD-10-CM

## 2019-06-16 MED ORDER — TORSEMIDE 20 MG PO TABS: ORAL_TABLET | ORAL | 4 refills | Status: DC

## 2019-06-16 MED ORDER — TORSEMIDE 20 MG PO TABS: ORAL_TABLET | ORAL | 3 refills | Status: DC

## 2019-06-16 NOTE — Progress Notes (Signed)
Virtual Visit via Telephone Note   This visit type was conducted due to national recommendations for restrictions regarding the COVID-19 Pandemic (e.g. social distancing) in an effort to limit this patient's exposure and mitigate transmission in our community.  Due to his co-morbid illnesses, this patient is at least at moderate risk for complications without adequate follow up.  This format is felt to be most appropriate for this patient at this time.  The patient did not have access to video technology/had technical difficulties with video requiring transitioning to audio format only (telephone).  All issues noted in this document were discussed and addressed.  No physical exam could be performed with this format.  Please refer to the patient's chart for his  consent to telehealth for [facility].   Date:  06/16/2019   ID:  [name], DOB [birth year], MRN [id number]  Patient Location: Home Provider Location: Home  PCP:  Lajean Manes, MD  Cardiologist:  No primary care provider on file.  Electrophysiologist:  None   Evaluation Performed:  Follow-Up Visit  Chief Complaint:  AVR; CAD  History of Present Illness:    [name] is a 85 y.o. 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  He is noting increased fluid in his lower extremities and some shortness of breath.  Because of this he has been drinking more fluid trying to "flush his kidneys".  He is not having orthopnea.  Says he recently had blood work done by Dr. Felipa Eth.  No chest pain.  No episodes of syncope or dizziness.  Appetite is been stable.  The patient does not have symptoms concerning for COVID-19 infection (fever, chills, cough, or new shortness of breath).    Past Medical History:  Diagnosis Date  . Chondromalacia   . Chronic systolic CHF (congestive heart failure) ([place])   . Coronary artery  disease    a. 2007: s/p CABG x3V (LIMA--> LAD, SVG--> PDA, SVG--> ramus)  c. 2017: LHC with 2/3 patent bypass grafts with occuluded SVG--> Ramus   . Diabetes mellitus ([place])   . GERD (gastroesophageal reflux disease)   . Glaucoma    "high normal"  . Gout   . H/O blood clots    R Lower leg, right upper leg  . History of renal cell cancer   . Hyperlipidemia   . Hypertension   . Joint pain   . Low back pain    "severe"  . Obesity   . Osteoporosis   . Polymyalgia ([place])    h/o  . Prostate cancer ([place])   . Severe aortic stenosis    a. 06/2017: s/p TAVR  . Spinal stenosis   . Thrombophlebitis   . Tubular adenoma    Past Surgical History:  Procedure Laterality Date  . APPENDECTOMY  60 years ago  . BACK SURGERY    . CARDIAC CATHETERIZATION    . CARDIAC CATHETERIZATION N/A 11/03/2016   Procedure: Right/Left Heart Cath and Coronary/Graft Angiography;  Surgeon: Belva Crome, MD;  Location: [facility];  Service: Cardiovascular;  Laterality: N/A;  . CORONARY ARTERY BYPASS GRAFT  2007  . EYE SURGERY Right    /w IOL, post cataracts   . PROSTATE SURGERY     seed implant  . ROBOTIC ASSITED PARTIAL NEPHRECTOMY Left 08/31/2013   Procedure: ROBOTIC ASSITED PARTIAL NEPHRECTOMY;  Surgeon: Dutch Gray, MD;  Location: [facility];  Service: Urology;  Laterality: Left;  . TEE WITHOUT CARDIOVERSION N/A 06/22/2017   Procedure: TRANSESOPHAGEAL ECHOCARDIOGRAM (TEE);  Surgeon: Sherren Mocha, MD;  Location: [facility];  Service: Open Heart Surgery;  Laterality: N/A;  . TRANSCATHETER AORTIC VALVE REPLACEMENT, TRANSFEMORAL N/A 06/22/2017   Procedure: TRANSCATHETER AORTIC VALVE REPLACEMENT, TRANSFEMORAL;  Surgeon: Sherren Mocha, MD;  Location: [place];  Service: Open Heart Surgery;  Laterality: N/A;  . VEIN LIGATION AND STRIPPING Right 1972   right lower leg     Current Meds  Medication Sig  . allopurinol (ZYLOPRIM) 300 MG tablet Take 300 mg by mouth daily.  Marland Kitchen aspirin 81 MG chewable tablet Chew 81 mg by  mouth every evening.   . Calcium Carbonate-Vitamin D 600-200 MG-UNIT TABS Take 1 tablet by mouth daily.  . carvedilol (COREG) 12.5 MG tablet Take 1 tablet (12.5 mg total) by mouth 2 (two) times daily with a meal.  . clindamycin (CLEOCIN) 300 MG capsule Take 2 capsules by mouth one hour prior to dental appointment  . glipiZIDE (GLUCOTROL) 10 MG tablet Take 5-10 mg by mouth 2 (two) times daily before a meal. 10 mg in the morning and 5 mg in the evening  . HYDROcodone-acetaminophen (NORCO) 10-325 MG tablet Take 1-2 tablets by mouth every 6 (six) hours as needed (pain).  . Multiple Vitamin (MULTIVITAMIN) tablet Take 1 tablet by mouth daily.  . predniSONE (DELTASONE) 1 MG tablet Take 5 mg by mouth daily with breakfast.  . rosuvastatin (CRESTOR) 10 MG tablet Take 10 mg by mouth daily.  . tamsulosin (FLOMAX) 0.4 MG CAPS capsule Take 0.4 mg by mouth daily after supper.  . torsemide (DEMADEX) 20 MG tablet TAKE 1 TABLET BY MOUTH TWICE A WEEK ON MONDAYS AND FRIDAYS     Allergies:   Fish allergy, Lipitor [atorvastatin], Penicillins, Simvastatin, Adacel [tetanus-diphth-acell pertussis], Lasix [furosemide], Septra [sulfamethoxazole-trimethoprim], Flexeril [cyclobenzaprine], and Pantoprazole   Social History   Tobacco Use  . Smoking status: Former Smoker    Packs/day: 3.00    Years: 30.00    Pack years: 90.00    Types: Cigarettes    Quit date: 12/21/1980    Years since quitting: 38.5  . Smokeless tobacco: Never Used  Substance Use Topics  . Alcohol use: No  . Drug use: No     Family Hx: The patient's family history includes Heart Problems in his father and mother.  ROS:   Please see the history of present illness.    Not limiting salt in his diet.  We encouraged that he dropped to 2 g or less of sodium per day.  Otherwise no complaints. All other systems reviewed and are negative.   Prior CV studies:   The following studies were reviewed today:  No new CV imaging studies.  Labs/Other  Tests and Data Reviewed:    EKG:  No ECG reviewed.  Recent Labs: No results found for requested labs within last 8760 hours.   Recent Lipid Panel Lab Results  Component Value Date/Time   CHOL 163 10/06/2017 02:41 PM   TRIG 113 10/06/2017 02:41 PM   HDL 69 10/06/2017 02:41 PM   CHOLHDL 2.4 10/06/2017 02:41 PM   LDLCALC 71 10/06/2017 02:41 PM    Wt Readings from Last 3 Encounters:  06/16/19 192 lb (87.1 kg)  09/08/18 198 lb (89.8 kg)  07/13/18 196 lb (88.9 kg)     Objective:    Vital Signs:  BP (!) 114/51   Pulse 81   Ht 5\' 6"  (1.676 m)  Wt 192 lb (87.1 kg)   BMI 30.99 kg/m    VITAL SIGNS:  reviewed  ASSESSMENT & PLAN:    1. S/P TAVR (transcatheter aortic valve replacement)   2. Coronary artery disease involving coronary bypass graft of native heart without angina pectoris   3. Chronic systolic CHF (congestive heart failure) ([place])   4. CKD (chronic kidney disease), stage III ([place])   5. Pure hypercholesterolemia   6. Type 2 diabetes mellitus with other circulatory complication, with long-term current use of insulin ([place])   7. Essential hypertension   8. Educated About Covid-19 Virus Infection    PLAN:  1. Assumption is that the TAVR valve is doing well.  Will need an echocardiogram in 6 to 12 months. 2. Secondary prevention discussed.  Will review recent laboratory data done by his primary physician. 3. Sounds like he is a little volume overloaded.  Recommended that he take torsemide 3 times this coming week instead of twice.  Also cut back on fluid intake. 4. We will check most recent laboratory data done by primary physician. 5. Target LDL is less than 70. 6. We will check hemoglobin A1c done most recently by primary. 7. Target blood pressure 130/80 mmHg. 8. Social distancing, handwashing, and masking discussed.  Guideline directed therapy for left ventricular systolic dysfunction: Angiotensin receptor-neprilysin inhibitor (ARNI)-Entresto; beta-blocker therapy  - carvedilol or metoprolol succinate; mineralocorticoid receptor antagonist (MRA) therapy -spironolactone or eplerenone.  These therapies have been shown to improve clinical outcomes including reduction of rehospitalization survival, and acute heart failure.   Overall education and awareness concerning primary/secondary risk prevention was discussed in detail: LDL less than 70, hemoglobin A1c less than 7, blood pressure target less than 130/80 mmHg, >150 minutes of moderate aerobic activity per week, avoidance of smoking, weight control (via diet and exercise), and continued surveillance/management of/for obstructive sleep apnea.   COVID-19 Education: The signs and symptoms of COVID-19 were discussed with the patient and how to seek care for testing (follow up with PCP or arrange E-visit).  The importance of social distancing was discussed today.  Time:   Today, I have spent 12 minutes with the patient with telehealth technology discussing the above problems.     Medication Adjustments/Labs and Tests Ordered: Current medicines are reviewed at length with the patient today.  Concerns regarding medicines are outlined above.   Tests Ordered: No orders of the defined types were placed in this encounter.   Medication Changes: No orders of the defined types were placed in this encounter.   Follow Up:  In Person in 8 month(s)  Signed, Sinclair Grooms, MD  06/16/2019 8:55 AM    [place]

## 2019-06-16 NOTE — Patient Instructions (Signed)
Medication Instructions:   Your physician recommends that you continue on your current medications as directed. Please refer to the Current Medication list given to you today.  If you need a refill on your cardiac medications before your next appointment, please call your pharmacy.     Lab work:  WE CALLED DR. Felipa Eth PCP OFFICE AND THEY WILL BE FAXING OVER YOUR MOST RECENT LABS FOR DR Tamala Julian TO REVIEW.   If you have labs (blood work) drawn today and your tests are completely normal, you will receive your results only by: Marland Kitchen MyChart Message (if you have MyChart) OR . A paper copy in the mail If you have any lab test that is abnormal or we need to change your treatment, we will call you to review the results.    Follow-Up: At [facility], you and your health needs are our priority.  As part of our continuing mission to provide you with exceptional heart care, we have created designated Provider Care Teams.  These Care Teams include your primary Cardiologist (physician) and Advanced Practice Providers (APPs -  Physician Assistants and Nurse Practitioners) who all work together to provide you with the care you need, when you need it.  Your physician wants you to follow-up in: 7-9 [place] will receive a reminder letter in the mail two months in advance. If you don't receive a letter, please call our office to schedule the follow-up appointment.

## 2019-07-11 DIAGNOSIS — N183 Chronic kidney disease, stage 3 (moderate): Secondary | ICD-10-CM | POA: Diagnosis not present

## 2019-07-11 DIAGNOSIS — E7800 Pure hypercholesterolemia, unspecified: Secondary | ICD-10-CM | POA: Diagnosis not present

## 2019-07-11 DIAGNOSIS — E1121 Type 2 diabetes mellitus with diabetic nephropathy: Secondary | ICD-10-CM | POA: Diagnosis not present

## 2019-07-11 DIAGNOSIS — E1142 Type 2 diabetes mellitus with diabetic polyneuropathy: Secondary | ICD-10-CM | POA: Diagnosis not present

## 2019-07-11 DIAGNOSIS — I5022 Chronic systolic (congestive) heart failure: Secondary | ICD-10-CM | POA: Diagnosis not present

## 2019-07-11 DIAGNOSIS — Z7984 Long term (current) use of oral hypoglycemic drugs: Secondary | ICD-10-CM | POA: Diagnosis not present

## 2019-08-10 DIAGNOSIS — Z85828 Personal history of other malignant neoplasm of skin: Secondary | ICD-10-CM | POA: Diagnosis not present

## 2019-08-10 DIAGNOSIS — L821 Other seborrheic keratosis: Secondary | ICD-10-CM | POA: Diagnosis not present

## 2019-08-10 DIAGNOSIS — L570 Actinic keratosis: Secondary | ICD-10-CM | POA: Diagnosis not present

## 2019-08-10 DIAGNOSIS — C44629 Squamous cell carcinoma of skin of left upper limb, including shoulder: Secondary | ICD-10-CM | POA: Diagnosis not present

## 2019-08-10 DIAGNOSIS — D1801 Hemangioma of skin and subcutaneous tissue: Secondary | ICD-10-CM | POA: Diagnosis not present

## 2019-09-01 DIAGNOSIS — E1142 Type 2 diabetes mellitus with diabetic polyneuropathy: Secondary | ICD-10-CM | POA: Diagnosis not present

## 2019-09-01 DIAGNOSIS — Z79899 Other long term (current) drug therapy: Secondary | ICD-10-CM | POA: Diagnosis not present

## 2019-09-01 DIAGNOSIS — E1165 Type 2 diabetes mellitus with hyperglycemia: Secondary | ICD-10-CM | POA: Diagnosis not present

## 2019-09-01 DIAGNOSIS — Z23 Encounter for immunization: Secondary | ICD-10-CM | POA: Diagnosis not present

## 2019-10-25 DIAGNOSIS — D696 Thrombocytopenia, unspecified: Secondary | ICD-10-CM | POA: Diagnosis not present

## 2019-10-25 DIAGNOSIS — I5022 Chronic systolic (congestive) heart failure: Secondary | ICD-10-CM | POA: Diagnosis not present

## 2019-10-25 DIAGNOSIS — Z79899 Other long term (current) drug therapy: Secondary | ICD-10-CM | POA: Diagnosis not present

## 2019-10-25 DIAGNOSIS — N184 Chronic kidney disease, stage 4 (severe): Secondary | ICD-10-CM | POA: Diagnosis not present

## 2019-10-25 DIAGNOSIS — M25512 Pain in left shoulder: Secondary | ICD-10-CM | POA: Diagnosis not present

## 2019-10-25 DIAGNOSIS — E7800 Pure hypercholesterolemia, unspecified: Secondary | ICD-10-CM | POA: Diagnosis not present

## 2019-10-25 DIAGNOSIS — I700 Atherosclerosis of aorta: Secondary | ICD-10-CM | POA: Diagnosis not present

## 2019-10-25 DIAGNOSIS — I340 Nonrheumatic mitral (valve) insufficiency: Secondary | ICD-10-CM | POA: Diagnosis not present

## 2019-10-25 DIAGNOSIS — E1121 Type 2 diabetes mellitus with diabetic nephropathy: Secondary | ICD-10-CM | POA: Diagnosis not present

## 2019-10-25 DIAGNOSIS — Z1389 Encounter for screening for other disorder: Secondary | ICD-10-CM | POA: Diagnosis not present

## 2019-10-25 DIAGNOSIS — E1142 Type 2 diabetes mellitus with diabetic polyneuropathy: Secondary | ICD-10-CM | POA: Diagnosis not present

## 2019-10-25 DIAGNOSIS — Z0000 Encounter for general adult medical examination without abnormal findings: Secondary | ICD-10-CM | POA: Diagnosis not present

## 2019-10-30 DIAGNOSIS — N189 Chronic kidney disease, unspecified: Secondary | ICD-10-CM | POA: Diagnosis not present

## 2019-10-30 DIAGNOSIS — I2510 Atherosclerotic heart disease of native coronary artery without angina pectoris: Secondary | ICD-10-CM | POA: Diagnosis not present

## 2019-10-30 DIAGNOSIS — N2581 Secondary hyperparathyroidism of renal origin: Secondary | ICD-10-CM | POA: Diagnosis not present

## 2019-10-30 DIAGNOSIS — Z8546 Personal history of malignant neoplasm of prostate: Secondary | ICD-10-CM | POA: Diagnosis not present

## 2019-10-30 DIAGNOSIS — M799 Soft tissue disorder, unspecified: Secondary | ICD-10-CM | POA: Diagnosis not present

## 2019-10-30 DIAGNOSIS — I350 Nonrheumatic aortic (valve) stenosis: Secondary | ICD-10-CM | POA: Diagnosis not present

## 2019-10-30 DIAGNOSIS — I5022 Chronic systolic (congestive) heart failure: Secondary | ICD-10-CM | POA: Diagnosis not present

## 2019-10-30 DIAGNOSIS — R911 Solitary pulmonary nodule: Secondary | ICD-10-CM | POA: Diagnosis not present

## 2019-10-30 DIAGNOSIS — N2889 Other specified disorders of kidney and ureter: Secondary | ICD-10-CM | POA: Diagnosis not present

## 2019-10-30 DIAGNOSIS — N183 Chronic kidney disease, stage 3 unspecified: Secondary | ICD-10-CM | POA: Diagnosis not present

## 2019-10-30 DIAGNOSIS — E1122 Type 2 diabetes mellitus with diabetic chronic kidney disease: Secondary | ICD-10-CM | POA: Diagnosis not present

## 2019-10-30 DIAGNOSIS — M4800 Spinal stenosis, site unspecified: Secondary | ICD-10-CM | POA: Diagnosis not present

## 2019-10-30 DIAGNOSIS — D631 Anemia in chronic kidney disease: Secondary | ICD-10-CM | POA: Diagnosis not present

## 2019-11-28 DIAGNOSIS — C61 Malignant neoplasm of prostate: Secondary | ICD-10-CM | POA: Diagnosis not present

## 2019-12-04 DIAGNOSIS — C61 Malignant neoplasm of prostate: Secondary | ICD-10-CM | POA: Diagnosis not present

## 2019-12-12 DIAGNOSIS — H40053 Ocular hypertension, bilateral: Secondary | ICD-10-CM | POA: Diagnosis not present

## 2019-12-12 DIAGNOSIS — H2512 Age-related nuclear cataract, left eye: Secondary | ICD-10-CM | POA: Diagnosis not present

## 2019-12-12 DIAGNOSIS — H5203 Hypermetropia, bilateral: Secondary | ICD-10-CM | POA: Diagnosis not present

## 2020-01-05 ENCOUNTER — Other Ambulatory Visit (HOSPITAL_COMMUNITY): Payer: Self-pay | Admitting: Cardiology

## 2020-02-07 ENCOUNTER — Other Ambulatory Visit (HOSPITAL_COMMUNITY): Payer: Self-pay | Admitting: Cardiology

## 2020-02-12 ENCOUNTER — Other Ambulatory Visit: Payer: Self-pay

## 2020-02-12 ENCOUNTER — Encounter (HOSPITAL_COMMUNITY): Payer: Self-pay | Admitting: Cardiology

## 2020-02-12 ENCOUNTER — Ambulatory Visit (HOSPITAL_COMMUNITY)
Admission: RE | Admit: 2020-02-12 | Discharge: 2020-02-12 | Disposition: A | Discharge status: Home / Self Care | Payer: PPO | Source: Ambulatory Visit | Attending: Cardiology | Admitting: Cardiology

## 2020-02-12 VITALS — BP 140/70 | HR 89 | Wt 203.4 lb

## 2020-02-12 DIAGNOSIS — I25810 Atherosclerosis of coronary artery bypass graft(s) without angina pectoris: Secondary | ICD-10-CM

## 2020-02-12 DIAGNOSIS — Z7982 Long term (current) use of aspirin: Secondary | ICD-10-CM | POA: Insufficient documentation

## 2020-02-12 DIAGNOSIS — Z87891 Personal history of nicotine dependence: Secondary | ICD-10-CM | POA: Diagnosis not present

## 2020-02-12 DIAGNOSIS — Z952 Presence of prosthetic heart valve: Secondary | ICD-10-CM | POA: Diagnosis not present

## 2020-02-12 DIAGNOSIS — I130 Hypertensive heart and chronic kidney disease with heart failure and stage 1 through stage 4 chronic kidney disease, or unspecified chronic kidney disease: Secondary | ICD-10-CM | POA: Diagnosis not present

## 2020-02-12 DIAGNOSIS — Z923 Personal history of irradiation: Secondary | ICD-10-CM | POA: Diagnosis not present

## 2020-02-12 DIAGNOSIS — Z7984 Long term (current) use of oral hypoglycemic drugs: Secondary | ICD-10-CM | POA: Diagnosis not present

## 2020-02-12 DIAGNOSIS — Z79899 Other long term (current) drug therapy: Secondary | ICD-10-CM | POA: Insufficient documentation

## 2020-02-12 DIAGNOSIS — I350 Nonrheumatic aortic (valve) stenosis: Secondary | ICD-10-CM | POA: Insufficient documentation

## 2020-02-12 DIAGNOSIS — E1122 Type 2 diabetes mellitus with diabetic chronic kidney disease: Secondary | ICD-10-CM | POA: Insufficient documentation

## 2020-02-12 DIAGNOSIS — N183 Chronic kidney disease, stage 3 unspecified: Secondary | ICD-10-CM | POA: Insufficient documentation

## 2020-02-12 DIAGNOSIS — M109 Gout, unspecified: Secondary | ICD-10-CM | POA: Diagnosis not present

## 2020-02-12 DIAGNOSIS — Z8546 Personal history of malignant neoplasm of prostate: Secondary | ICD-10-CM | POA: Diagnosis not present

## 2020-02-12 DIAGNOSIS — I255 Ischemic cardiomyopathy: Secondary | ICD-10-CM | POA: Insufficient documentation

## 2020-02-12 DIAGNOSIS — I2510 Atherosclerotic heart disease of native coronary artery without angina pectoris: Secondary | ICD-10-CM | POA: Insufficient documentation

## 2020-02-12 DIAGNOSIS — E785 Hyperlipidemia, unspecified: Secondary | ICD-10-CM | POA: Diagnosis not present

## 2020-02-12 DIAGNOSIS — R0683 Snoring: Secondary | ICD-10-CM | POA: Diagnosis not present

## 2020-02-12 DIAGNOSIS — I5022 Chronic systolic (congestive) heart failure: Secondary | ICD-10-CM

## 2020-02-12 DIAGNOSIS — Z7952 Long term (current) use of systemic steroids: Secondary | ICD-10-CM | POA: Diagnosis not present

## 2020-02-12 DIAGNOSIS — I447 Left bundle-branch block, unspecified: Secondary | ICD-10-CM | POA: Insufficient documentation

## 2020-02-12 DIAGNOSIS — Z85528 Personal history of other malignant neoplasm of kidney: Secondary | ICD-10-CM | POA: Insufficient documentation

## 2020-02-12 LAB — LIPID PANEL
Cholesterol: 157 mg/dL (ref 0–200)
HDL: 55 mg/dL (ref >40)
LDL Cholesterol: 88 mg/dL (ref 0–99)
Total CHOL/HDL Ratio: 2.9 RATIO
Triglycerides: 72 mg/dL (ref <150)
VLDL: 14 mg/dL (ref 0–40)

## 2020-02-12 LAB — BASIC METABOLIC PANEL
Anion gap: 11 (ref 5–15)
BUN: 21 mg/dL (ref 8–23)
CO2: 28 mmol/L (ref 22–32)
Calcium: 9.9 mg/dL (ref 8.9–10.3)
Chloride: 101 mmol/L (ref 98–111)
Creatinine, Ser: 1.72 mg/dL — ABNORMAL HIGH (ref 0.61–1.24)
GFR calc Af Amer: 42 mL/min — ABNORMAL LOW (ref >60)
GFR calc non Af Amer: 36 mL/min — ABNORMAL LOW (ref >60)
Glucose, Bld: 217 mg/dL — ABNORMAL HIGH (ref 70–99)
Potassium: 4.7 mmol/L (ref 3.5–5.1)
Sodium: 140 mmol/L (ref 135–145)

## 2020-02-12 NOTE — Progress Notes (Signed)
Pt unable to void in office for urine test. Will return to clinic on Thursday for echo and will complete urine test.

## 2020-02-12 NOTE — Progress Notes (Signed)
PCP: Dr. Felipa Eth Cardiology: Dr. Tamala Julian HF Cardiology: Dr. Aundra Dubin   86 yo with history of CAD s/p CABG, chronic systolic CHF, CKD stage 3, and suspected severe aortic stenosis presents for followup of CHF and AS.  Patient had CABG in 2007.  In 10/17, he had an echo showing EF down to 20-25%.  With fall in EF, he ended up having a right and left heart cath done in 11/17.  The cath showed normal right and left heart filling pressures.  The LIMA-LAD and SVG-PDA were patent but the SVG-ramus was occluded.  The fall in EF was thought to be disproportionate to the coronary disease. Echo in 4/18 was concerning for low gradient severe AS.  This was confirmed in 5/18 by low dose DSE.   He has had a hard time getting on cardiac meds.  Hydralazine/nitrates were not tolerated due to hypotension.  He has not been on ACEI/ARB/ARNI due to elevated creatinine.  He did not tolerate spironolactone.  He was then on the combination of torsemide, eplerenone, and low dose Coreg.   He felt sleepy/over-sedated and creatinine rose, so he stopped both torsemide and eplerenone.     He is S/P TAVR 06/22/2017.  Echo in 7/18 showed EF 30% with severe LV dilation, prominent dyssynchrony, s/p TAVR with mean gradient 11 mmHg and no AI.  In 7/18 post-TAVR, QRS was noted to widen to LBBB at 188 msec => suspect this led to the dyssynchrony seen on 7/18 echo.  However, more recent ECGs have shown narrowing of the QRS again.  Last echo in 7/19 showed EF up to 50-55% with normal bioprosthetic aortic valve s/p TAVR (mean gradient 15 mmHg).   He has been stable from a cardiac standpoint. Main complaint is chronic low back pain.  He is limited by back pain when walking, no dyspnea.  No chest pain.  No orthopnea/PND.  No lightheadedness.  Diet not great, ate some pig skins yesterday.  Weight is up 9 lbs over the last year or so. He is quite sleepy during the day.   - EKG: NSR, LVH, QRS 140 msec (personally reviewed)   Labs (5/18): K 4.7,  creatinine 2.29 Labs (6/18): K 4.3, creatinine 1.84 Labs (7/18): K 4.3, creatinine 2.05 Labs (8/18): K 5.1, creatinine 1.8 Labs (10/18): LDL 71, HDL 69, K 4.7, creatinine 1.84 Labs (4/20): hgb 12.4, K 4.6, creatinine 1.83  PMH: 1. Low back pain: Severe, from arthritis.  2. Gout 3. Hyperlipidemia: Myalgias with simvastatin and atorvastatin.  4. Prostate cancer s/p radiation.  5. Renal cell carcinoma s/p left partial nephrectomy.  6. HTN 7. Type II diabetes 8. CAD: s/p CABG in 2007.  - LHC/RHC (11/17): Totally occluded left main and RCA. Totally occluded SVG-ramus.  Patent SVG-PDA and LIMA-LAD.  Mean RA 7, PA 21/4, mean PCWP 6, CI 2.72.  9. CKD: Stage 3.  10. Chronic systolic CHF: Ischemic cardiomyopathy, though severe AS may play a roll. Gynecomastia with spironolactone.  - Echo (10/17): EF 20-25%, aortic stenosis reported as mild.  - Echo (4/18): EF 25-30%, low gradient severe AS suspected, mild MR, RV function mildly reduced.  - Echo (7/18) with EF 30%, severe LV dilation, septal-lateral dyssynchrony, s/p TAVR with mean gradient 11 mmHg, no AI, mild MR.   - Echo (7/19): EF 50-55%, moderate LVH, s/p TAVR with mean gradient 15, mild MR 11. Aortic stenosis: Suspect low gradient severe AS.  - Low dose DSE (5/18): EF 20-25%, mean gradient across the aortic valve rose from 25  mmHg => 41 mmHg and AVA remained 0.8 cm^2.   - TAVR (7/18) with 29 mm Edwards Sapien 3 valve.  - Echo (11/18): EF 40-45%, mild LV dilation, normal bioprosthetic aortic valve s/p TAVR with mean gradient 9 mmHg, RV moderately dilated.   12. Carotid dopplers (6/18) with mild bilateral carotid stenosis.   SH: Lives with wife in [facility].  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  He has not smoked in > 30 years.   ROS: All systems reviewed and negative except as per HPI.   Family History  Problem Relation Age of Onset  . Heart Problems Mother   . Heart Problems Father    Review of systems complete and found to  be negative unless listed in HPI.    Current Outpatient Medications  Medication Sig Dispense Refill  . allopurinol (ZYLOPRIM) 300 MG tablet Take 300 mg by mouth daily.  2  . aspirin 81 MG chewable tablet Chew 81 mg by mouth every evening.     . Calcium Carbonate-Vitamin D 600-200 MG-UNIT TABS Take 1 tablet by mouth daily.    . carvedilol (COREG) 12.5 MG tablet Take 1/2 tablet by mouth every morning and 1 full tablet every evening    . clindamycin (CLEOCIN) 300 MG capsule Take 2 capsules by mouth one hour prior to dental appointment 6 capsule 1  . glipiZIDE (GLUCOTROL) 10 MG tablet Take 1 tablet by mouth every morning and 1/2 tablet by mouth every evening    . HYDROcodone-acetaminophen (NORCO) 10-325 MG tablet Take 1-2 tablets by mouth every 6 (six) hours as needed (pain).    . indomethacin (INDOCIN) 25 MG capsule Take 25 mg by mouth daily as needed.    . Multiple Vitamin (MULTIVITAMIN) tablet Take 1 tablet by mouth daily.    . predniSONE (DELTASONE) 5 MG tablet Take 5 mg by mouth daily with breakfast.     . rosuvastatin (CRESTOR) 10 MG tablet Take 10 mg by mouth daily.    . tamsulosin (FLOMAX) 0.4 MG CAPS capsule Take 0.4 mg by mouth daily after supper.    . torsemide (DEMADEX) 20 MG tablet Take 20 mg by mouth daily as needed.     No current facility-administered medications for this encounter.   Vitals:   02/12/20 1149  BP: 140/70  Pulse: 89  SpO2: 96%  Weight: 92.3 kg (203 lb 6.4 oz)   Wt Readings from Last 3 Encounters:  02/12/20 92.3 kg (203 lb 6.4 oz)  06/16/19 87.1 kg (192 lb)  09/08/18 89.8 kg (198 lb)   General: NAD Neck: Thick, no JVD, no thyromegaly or thyroid nodule.  Lungs: Clear to auscultation bilaterally with normal respiratory effort. CV: Nondisplaced PMI.  Heart regular S1/S2, no S3/S4, 1/6 SEM RUSB.  1+ edema 1/2 to knees bilaterally.  No carotid bruit.  Normal pedal pulses.  Abdomen: Soft, nontender, no hepatosplenomegaly, no distention.  Skin: Intact without  lesions or rashes.  Neurologic: Alert and oriented x 3.  Psych: Normal affect. Extremities: No clubbing or cyanosis.  HEENT: Normal.   Assessment/Plan: 1. CAD: s/p CABG.  Last cath in 11/17 with patent LIMA-LAD, patent SVG-PDA, and occluded SVG-ramus.  No intervention.  No chest pain.  - Continue ASA and statin.     2. CKD: Stage 3.  Followed by Dr. Justin Mend. - BMET today.   3. Chronic systolic=>diastolic CHF: Ischemic and possibly valvular (low gradient severe AS) cardiomyopathy.  He had TAVR in 7/18.  Echo in 7/18 after TAVR showed EF  30% with septal-lateral dyssynchrony.  When 7/18 echo was done, ECG showed QRS 188 msec, LBBB.  In 10/18, ECG showed QRS down to 122 msec.  Suspect inflammation/swelling peri-TAVR drove the worsening LBBB, now improved again.  EF up to 50-55% on 7/19 echo.  NYHA class II symptoms, mainly limited by low back pain. He is not volume overloaded on exam.   - Continue current Coreg. - Continue torsemide twice weekly (BMET today).  - Hold off on ACEI/ARB/ARNI/eplerenone with elevated creatinine and near normal EF.  Spironolactone caused gynecomastia. - I will arrange for repeat echo.    4. Aortic stenosis: s/p TAVR with Edwards Sapien 3 bioprosthesis.  - Echo to follow TAVR valve.  - Given aortic stenosis and CHF, I think that it would be reasonable to check Mr Jiles for cardiac amyloidosis.  I will send myeloma panel and urine immunofixation, and will order a PYP scan.  5. Hyperlipidemia: Check lipids today.   6. Suspect OSA: Daytime sleepiness.  I will order a home sleep study.   Followup in 6 months if echo and PYP unremarkable.   Loralie Champagne 02/12/2020

## 2020-02-12 NOTE — Patient Instructions (Addendum)
No medication changes today!   Your provider has recommended that you have a home sleep study.  [employer] is the company that does these test.  They will contact you by phone and must speak with you before they can ship the equipment.  Once they have spoken with you they will send the equipment right to your home with instructions on how to set it up.  Once you have completed the test simply box all the equipment back up and mail back to the company.  IF you have any questions or issues with the equipment please call the company directly at [phone].  If your test is positive for sleep apnea and you need a home CPAP machine you will be contacted by Dr Theodosia Blender office [facility]) to set this up.   You have been ordered a PYP Scan.  This is done in the Radiology Department of [facility].  When you come for this test please plan to be there 2-3 hours. You will get a call to schedule this appointment   Your physician has requested that you have an echocardiogram. Echocardiography is a painless test that uses sound waves to create images of your heart. It provides your doctor with information about the size and shape of your heart and how well your heart's chambers and valves are working. This procedure takes approximately one hour. There are no restrictions for this procedure.  ECHO APPOINTMENT: February 25th, 2021 at 11:00AM. [place] code [phone]   Your physician recommends that you schedule a follow-up appointment in: 6 months with Dr Aundra Dubin.  We will call you to schedule this appointment closer to that time.    Please call office at [phone] option 2 if you have any questions or concerns.    At the [facility], you and your health needs are our priority. As part of our continuing mission to provide you with exceptional heart care, we have created designated Provider Care Teams. These Care Teams include your primary Cardiologist (physician) and  Advanced Practice Providers (APPs- Physician Assistants and Nurse Practitioners) who all work together to provide you with the care you need, when you need it.   You may see any of the following providers on your designated Care Team at your next follow up: Marland Kitchen Dr Glori Bickers . Dr Loralie Champagne . Darrick Grinder, NP . Lyda Jester, PA . Audry Riles, PharmD   Please be sure to bring in all your medications bottles to every appointment.

## 2020-02-12 NOTE — Progress Notes (Addendum)
Patient Name: [name]        DOB: August 06, 1934      Height: 5'6    Weight: 203 lb  Office Name: [facility]          Referring Provider: Loralie Champagne  Today's Date: 02/12/20  Date:   STOP BANG RISK ASSESSMENT S (snore) Have you been told that you snore?     YES   T (tired) Are you often tired, fatigued, or sleepy during the day?   YES  O (obstruction) Do you stop breathing, choke, or gasp during sleep? NO   P (pressure) Do you have or are you being treated for high blood pressure? NO   B (BMI) Is your body index greater than 35 kg/m? NO   A (age) Are you 6 years old or older? YES   N (neck) Do you have a neck circumference greater than 16 inches?   NO   G (gender) Are you a male? YES   TOTAL STOP/BANG "YES" ANSWERS 4                                                                       For Office Use Only              Procedure Order Form    YES to 3+ Stop Bang questions OR two clinical symptoms - patient qualifies for [employer] (CPT 95800)     Submit: This Form + Patient Face Sheet + Clinical Note via [employer] or Fax: [phone]         Clinical Notes: Will consult Sleep Specialist and refer for management of therapy due to patient increased risk of Sleep Apnea. Ordering a sleep study due to the following two clinical symptoms: Excessive daytime sleepiness G47.10 / Gastroesophageal reflux K21.9 / Nocturia R35.1 / Morning Headaches G44.221 / Difficulty concentrating R41.840 / Memory problems or poor judgment G31.84 / Personality changes or irritability R45.4 / Loud snoring R06.83 / Depression F32.9 / Unrefreshed by sleep G47.8 / Impotence N52.9 / History of high blood pressure R03.0 / Insomnia G47.00    I understand that I am proceeding with a home sleep apnea test as ordered by my treating physician. I understand that untreated sleep apnea is a serious cardiovascular risk factor and it is my responsibility to perform the test and seek management for  sleep apnea. I will be contacted with the results and be managed for sleep apnea by a local sleep physician. I will be receiving equipment and further instructions from [facility]. I shall promptly ship back the equipment via the included mailing label. I understand my insurance will be billed for the test and as the patient I am responsible for any insurance related out-of-pocket costs incurred. I have been provided with written instructions and can call for additional video or telephonic instruction, with 24-hour availability of qualified personnel to answer any questions: Patient Help Desk [phone].  Patient Signature ______________________________________________________   Date______________________ Patient Telemedicine Verbal Consent

## 2020-02-14 ENCOUNTER — Telehealth (HOSPITAL_COMMUNITY): Payer: Self-pay

## 2020-02-14 DIAGNOSIS — E7800 Pure hypercholesterolemia, unspecified: Secondary | ICD-10-CM

## 2020-02-14 LAB — MULTIPLE MYELOMA PANEL, SERUM
Albumin SerPl Elph-Mcnc: 3.8 g/dL (ref 2.9–4.4)
Albumin/Glob SerPl: 1.5 (ref 0.7–1.7)
Alpha 1: 0.2 g/dL (ref 0.0–0.4)
Alpha2 Glob SerPl Elph-Mcnc: 0.9 g/dL (ref 0.4–1.0)
B-Globulin SerPl Elph-Mcnc: 1.1 g/dL (ref 0.7–1.3)
Gamma Glob SerPl Elph-Mcnc: 0.5 g/dL (ref 0.4–1.8)
Globulin, Total: 2.7 g/dL (ref 2.2–3.9)
IgA: 194 mg/dL (ref 61–437)
IgG (Immunoglobin G), Serum: 572 mg/dL — ABNORMAL LOW (ref 603–1613)
IgM (Immunoglobulin M), Srm: 128 mg/dL (ref 15–143)
Total Protein ELP: 6.5 g/dL (ref 6.0–8.5)

## 2020-02-14 MED ORDER — ROSUVASTATIN CALCIUM 20 MG PO TABS: 20.0000 mg | ORAL_TABLET | Freq: Every day | ORAL | 1 refills | Status: DC

## 2020-02-14 NOTE — Telephone Encounter (Signed)
Order, OV note, stop bang and demographics all faxed to [employer] at [phone]  

## 2020-02-14 NOTE — Telephone Encounter (Signed)
-----   Message from Larey Dresser, MD sent at 02/13/2020  2:25 PM EST ----- Stable creatinine.  Would like to see LDL < 70, would increase Crestor to 20 mg daily with lipids/LFTs in 2 months.

## 2020-02-15 ENCOUNTER — Other Ambulatory Visit (HOSPITAL_COMMUNITY): Payer: Self-pay

## 2020-02-15 ENCOUNTER — Other Ambulatory Visit: Payer: Self-pay

## 2020-02-15 ENCOUNTER — Ambulatory Visit (HOSPITAL_COMMUNITY)
Admission: RE | Admit: 2020-02-15 | Discharge: 2020-02-15 | Disposition: A | Discharge status: Home / Self Care | Payer: PPO | Source: Ambulatory Visit | Attending: Cardiology | Admitting: Cardiology

## 2020-02-15 ENCOUNTER — Ambulatory Visit (HOSPITAL_COMMUNITY)
Admission: RE | Admit: 2020-02-15 | Discharge: 2020-02-15 | Disposition: A | Discharge status: Home / Self Care | Payer: PPO | Source: Ambulatory Visit | Attending: Internal Medicine | Admitting: Internal Medicine

## 2020-02-15 DIAGNOSIS — I5022 Chronic systolic (congestive) heart failure: Secondary | ICD-10-CM | POA: Insufficient documentation

## 2020-02-15 DIAGNOSIS — E785 Hyperlipidemia, unspecified: Secondary | ICD-10-CM | POA: Insufficient documentation

## 2020-02-15 DIAGNOSIS — E7800 Pure hypercholesterolemia, unspecified: Secondary | ICD-10-CM

## 2020-02-15 DIAGNOSIS — E119 Type 2 diabetes mellitus without complications: Secondary | ICD-10-CM | POA: Insufficient documentation

## 2020-02-15 DIAGNOSIS — I110 Hypertensive heart disease with heart failure: Secondary | ICD-10-CM | POA: Insufficient documentation

## 2020-02-15 DIAGNOSIS — K219 Gastro-esophageal reflux disease without esophagitis: Secondary | ICD-10-CM | POA: Diagnosis not present

## 2020-02-15 DIAGNOSIS — I2510 Atherosclerotic heart disease of native coronary artery without angina pectoris: Secondary | ICD-10-CM | POA: Insufficient documentation

## 2020-02-15 DIAGNOSIS — Z952 Presence of prosthetic heart valve: Secondary | ICD-10-CM | POA: Diagnosis not present

## 2020-02-15 NOTE — Addendum Note (Signed)
Encounter addended by: Valeda Malm, RN on: 02/15/2020 12:11 PM  Actions taken: Order list changed, Diagnosis association updated

## 2020-02-15 NOTE — Progress Notes (Signed)
  Echocardiogram 2D Echocardiogram has been performed.  Arushi Partridge G Tuan Tippin 02/15/2020, 12:12 PM

## 2020-02-19 ENCOUNTER — Telehealth: Payer: Self-pay | Admitting: Interventional Cardiology

## 2020-02-19 NOTE — Telephone Encounter (Signed)
Spoke with pt and he states Dr. Claris Gladden office reached out about having a sleep study performed.  Pt wanted to see if Dr. Tamala Julian felt this was appropriate.

## 2020-02-19 NOTE — Telephone Encounter (Signed)
Patient had a question for Dr. Tamala Julian. He did not provide further information

## 2020-02-20 LAB — IMMUNOFIXATION, URINE: Immunofixation, Urine: UNDETERMINED

## 2020-02-21 NOTE — Telephone Encounter (Signed)
Yes, a sleep study is very appropriate

## 2020-02-21 NOTE — Telephone Encounter (Signed)
Spoke with pt and made him aware Dr. Tamala Julian agreed with sleep study.  Advised pt to reach out to CHF clinic so they can get him scheduled.  Pt thanked me for the call.

## 2020-02-29 ENCOUNTER — Telehealth (HOSPITAL_COMMUNITY): Payer: Self-pay | Admitting: *Deleted

## 2020-02-29 NOTE — Telephone Encounter (Signed)
Pt's insurance is out of network w/[facility] so pt can not have home sleep study w/them.  Pt is aware, advised other option is an in lab split night study, pt is very hesitant to have that done at this time, he states he will keep it in mind and discuss w/MD at next OV

## 2020-03-16 NOTE — Progress Notes (Signed)
Cardiology Office Note:    Date:  03/18/2020   ID:  [name], DOB 11-Oct-1934, MRN [id number]  PCP:  Lajean Manes, MD  Cardiologist:  Sinclair Grooms, MD   Referring MD: Lajean Manes, MD   Chief Complaint  Patient presents with  . Coronary Artery Disease  . Cardiac Valve Problem    TAVR    History of Present Illness:    [name] is a 86 y.o. 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  [name] is doing well.  He is being limited by severe back discomfort related to lumbar disc disease.  Had surgery several years ago and 3 months after surgery was essentially wheelchair-bound.  He is seen other back surgeons and been told that his operation to repair the current problem would take 4 hours.  The state of his heart at that time prior to TAVR lead then to feel surgery would not be possible because of his heart.  Heart wise, [name] is doing well now.  He had TAVR performed.  He has a prior history of coronary bypass grafting.  Since TAVR, LV function has returned to normal.  He is not short of breath and has no orthopnea.  There is no lower extremity edema.  Recently performed echocardiogram demonstrates improvement in EF from 30% to sent to lie 2018 to February 2021.  He denies palpitations, orthopnea, lower extremity edema, syncope, but does have dyspnea on exertion.  There is no associated exertional angina.  Past Medical History:  Diagnosis Date  . Chondromalacia   . Chronic systolic CHF (congestive heart failure) ([place])   . Coronary artery disease    a. 2007: s/p CABG x3V (LIMA--> LAD, SVG--> PDA, SVG--> ramus)  c. 2017: LHC with 2/3 patent bypass grafts with occuluded SVG--> Ramus   . Diabetes mellitus ([place])   . GERD (gastroesophageal reflux disease)   . Glaucoma    "high normal"  . Gout   . H/O blood clots    R Lower leg, right upper  leg  . History of renal cell cancer   . Hyperlipidemia   . Hypertension   . Joint pain   . Low back pain    "severe"  . Obesity   . Osteoporosis   . Polymyalgia ([place])    h/o  . Prostate cancer ([place])   . Severe aortic stenosis    a. 06/2017: s/p TAVR  . Spinal stenosis   . Thrombophlebitis   . Tubular adenoma     Past Surgical History:  Procedure Laterality Date  . APPENDECTOMY  60 years ago  . BACK SURGERY    . CARDIAC CATHETERIZATION    . CARDIAC CATHETERIZATION N/A 11/03/2016   Procedure: Right/Left Heart Cath and Coronary/Graft Angiography;  Surgeon: Belva Crome, MD;  Location: [facility];  Service: Cardiovascular;  Laterality: N/A;  . CORONARY ARTERY BYPASS GRAFT  2007  . EYE SURGERY Right    /w IOL, post cataracts   . PROSTATE SURGERY     seed implant  . ROBOTIC ASSITED PARTIAL NEPHRECTOMY Left 08/31/2013   Procedure: ROBOTIC ASSITED PARTIAL NEPHRECTOMY;  Surgeon: Dutch Gray, MD;  Location: [facility];  Service: Urology;  Laterality: Left;  . TEE WITHOUT CARDIOVERSION N/A 06/22/2017   Procedure: TRANSESOPHAGEAL ECHOCARDIOGRAM (TEE);  Surgeon: Sherren Mocha, MD;  Location: [facility];  Service: Open  Heart Surgery;  Laterality: N/A;  . TRANSCATHETER AORTIC VALVE REPLACEMENT, TRANSFEMORAL N/A 06/22/2017   Procedure: TRANSCATHETER AORTIC VALVE REPLACEMENT, TRANSFEMORAL;  Surgeon: Sherren Mocha, MD;  Location: [facility];  Service: Open Heart Surgery;  Laterality: N/A;  . VEIN LIGATION AND STRIPPING Right 1972   right lower leg    Current Medications: Current Meds  Medication Sig  . allopurinol (ZYLOPRIM) 300 MG tablet Take 300 mg by mouth daily.  Marland Kitchen aspirin 81 MG chewable tablet Chew 81 mg by mouth every evening.   . Calcium Carbonate-Vitamin D 600-200 MG-UNIT TABS Take 1 tablet by mouth daily.  . carvedilol (COREG) 12.5 MG tablet Take 1/2 tablet by mouth every morning and 1 full tablet every evening  . clindamycin (CLEOCIN) 300 MG capsule Take 2 capsules by mouth one hour  prior to dental appointment  . glipiZIDE (GLUCOTROL) 10 MG tablet Take 1 tablet by mouth every morning and 1/2 tablet by mouth every evening  . HYDROcodone-acetaminophen (NORCO) 10-325 MG tablet Take 1-2 tablets by mouth every 6 (six) hours as needed (pain).  . indomethacin (INDOCIN) 25 MG capsule Take 25 mg by mouth daily as needed.  . Multiple Vitamin (MULTIVITAMIN) tablet Take 1 tablet by mouth daily.  . predniSONE (DELTASONE) 5 MG tablet Take 5 mg by mouth daily with breakfast.   . rosuvastatin (CRESTOR) 20 MG tablet Take 1 tablet (20 mg total) by mouth daily.  . tamsulosin (FLOMAX) 0.4 MG CAPS capsule Take 0.4 mg by mouth daily after supper.  . torsemide (DEMADEX) 20 MG tablet Take 20 mg by mouth daily as needed.     Allergies:   Fish allergy, Lipitor [atorvastatin], Penicillins, Simvastatin, Adacel [tetanus-diphth-acell pertussis], Lasix [furosemide], Septra [sulfamethoxazole-trimethoprim], Flexeril [cyclobenzaprine], and Pantoprazole   Social History   Socioeconomic History  . Marital status: Married    Spouse name: Not on file  . Number of children: Not on file  . Years of education: Not on file  . Highest education level: Not on file  Occupational History  . Not on file  Tobacco Use  . Smoking status: Former Smoker    Packs/day: 3.00    Years: 30.00    Pack years: 90.00    Types: Cigarettes    Quit date: 12/21/1980    Years since quitting: 39.2  . Smokeless tobacco: Never Used  Substance and Sexual Activity  . Alcohol use: No  . Drug use: No  . Sexual activity: Not on file  Other Topics Concern  . Not on file  Social History Narrative  . Not on file   Social Determinants of Health   Financial Resource Strain:   . Difficulty of Paying Living Expenses:   Food Insecurity:   . Worried About Charity fundraiser in the Last Year:   . Arboriculturist in the Last Year:   Transportation Needs:   . Film/video editor (Medical):   Marland Kitchen Lack of Transportation  (Non-Medical):   Physical Activity:   . Days of Exercise per Week:   . Minutes of Exercise per Session:   Stress:   . Feeling of Stress :   Social Connections:   . Frequency of Communication with Friends and Family:   . Frequency of Social Gatherings with Friends and Family:   . Attends Religious Services:   . Active Member of Clubs or Organizations:   . Attends Archivist Meetings:   Marland Kitchen Marital Status:      Family History: The patient's family history includes  Heart Problems in his father and mother.  ROS:   Please see the history of present illness.    He has difficulty with his right shoulder and arm after a fall greater than a year ago.  Difficulty with ambulation and walking because of leg pain due to lumbar disc disease.  At one point was in a wheelchair because of severe pain.  Not able to stay physically active because leg pain prevents movement for any significant period of time.  All other systems reviewed and are negative.  EKGs/Labs/Other Studies Reviewed:    The following studies were reviewed today:   2 D Doppler ECHOCARDIOGRAM 2021: IMPRESSIONS    1. Left ventricular ejection fraction, by estimation, is 55 to 60%. The  left ventricle has normal function. The left ventricle has no regional  wall motion abnormalities. Left ventricular diastolic parameters are  consistent with Grade I diastolic  dysfunction (impaired relaxation).  2. Right ventricular systolic function is normal. The right ventricular  size is normal.  3. The mitral valve is normal in structure and function. No evidence of  mitral valve regurgitation. No evidence of mitral stenosis.  4. The aortic valve has been repaired/replaced. Aortic valve  regurgitation is not visualized.   EKG:  EKG normal sinus rhythm with interventricular conduction delay.  Otherwise unremarkable, performed on 03/11/2020.  Recent Labs: 02/12/2020: BUN 21; Creatinine, Ser 1.72; Potassium 4.7; Sodium 140    Recent Lipid Panel    Component Value Date/Time   CHOL 157 02/12/2020 1236   TRIG 72 02/12/2020 1236   HDL 55 02/12/2020 1236   CHOLHDL 2.9 02/12/2020 1236   VLDL 14 02/12/2020 1236   LDLCALC 88 02/12/2020 1236    Physical Exam:    VS:  BP 130/72   Pulse 68   Ht 5' 6.5" (1.689 m)   Wt 199 lb 12.8 oz (90.6 kg)   SpO2 99%   BMI 31.77 kg/m     Wt Readings from Last 3 Encounters:  03/18/20 199 lb 12.8 oz (90.6 kg)  02/12/20 203 lb 6.4 oz (92.3 kg)  06/16/19 192 lb (87.1 kg)     GEN: Compatible with age. No acute distress HEENT: Normal NECK: No JVD. LYMPHATICS: No lymphadenopathy CARDIAC: 1-2/ 6 right upper sternal border systolic murmur of aortic prosthesis.  RRR without diastolic murmur, gallop, or edema. VASCULAR:  Normal Pulses. No bruits. RESPIRATORY:  Clear to auscultation without rales, wheezing or rhonchi  ABDOMEN: Soft, non-tender, non-distended, No pulsatile mass, MUSCULOSKELETAL: No deformity  SKIN: Warm and dry NEUROLOGIC:  Alert and oriented x 3 PSYCHIATRIC:  Normal affect   ASSESSMENT:    1. Chronic systolic CHF (congestive heart failure) ([place])   2. Coronary artery disease involving coronary bypass graft of native heart without angina pectoris   3. S/P TAVR (transcatheter aortic valve replacement)   4. Stage 3 chronic kidney disease, unspecified whether stage 3a or 3b CKD   5. Pure hypercholesterolemia   6. Snoring   7. Educated about COVID-19 virus infection    PLAN:    In order of problems listed above:  1. Systolic dysfunction has resolved to chronic diastolic heart failure.  No evidence of volume overload.  EF has increased after TAVR from 30% to 60%. 2. Secondary prevention discussed. 3. Normally functioning TAVR valve. 4. Creatinine is 1.72.  No significant changes anticipated. 5. LDL cholesterol target is less than 70.  Most recently in February it was 99.  The plan is to continue moderate intensity statin therapy  with rosuvastatin 20  mg/day.  Diabetes also requires control to LDL less than equal to 70. 6. Sleep study ordered by the advanced heart failure clinic.  Insurance refused to allow the study.  I think it is a good idea for the patient to have a sleep study.  He will try to change his insurance so that there will not be an objection. 7. COVID-19 vaccine has been received.  Social distancing and mask wearing continue.  Now that LV function has recovered and stable, I believe he could be a candidate for a 4+ hour neurosurgical procedure on his back.  I have recommended that he reengage with the neurosurgery service to see if they would reconsider operating.  He asked about Dr. Ellene Route.    Medication Adjustments/Labs and Tests Ordered: Current medicines are reviewed at length with the patient today.  Concerns regarding medicines are outlined above.  No orders of the defined types were placed in this encounter.  No orders of the defined types were placed in this encounter.   There are no Patient Instructions on file for this visit.   Signed, Sinclair Grooms, MD  03/18/2020 10:24 AM    [place]

## 2020-03-18 ENCOUNTER — Other Ambulatory Visit: Payer: Self-pay

## 2020-03-18 ENCOUNTER — Encounter: Payer: Self-pay | Admitting: Interventional Cardiology

## 2020-03-18 ENCOUNTER — Ambulatory Visit: Payer: PPO | Admitting: Interventional Cardiology

## 2020-03-18 VITALS — BP 130/72 | HR 68 | Ht 66.5 in | Wt 199.8 lb

## 2020-03-18 DIAGNOSIS — I25810 Atherosclerosis of coronary artery bypass graft(s) without angina pectoris: Secondary | ICD-10-CM

## 2020-03-18 DIAGNOSIS — Z7189 Other specified counseling: Secondary | ICD-10-CM

## 2020-03-18 DIAGNOSIS — E7800 Pure hypercholesterolemia, unspecified: Secondary | ICD-10-CM | POA: Diagnosis not present

## 2020-03-18 DIAGNOSIS — N183 Chronic kidney disease, stage 3 unspecified: Secondary | ICD-10-CM | POA: Diagnosis not present

## 2020-03-18 DIAGNOSIS — Z952 Presence of prosthetic heart valve: Secondary | ICD-10-CM | POA: Diagnosis not present

## 2020-03-18 DIAGNOSIS — R0683 Snoring: Secondary | ICD-10-CM

## 2020-03-18 DIAGNOSIS — I5022 Chronic systolic (congestive) heart failure: Secondary | ICD-10-CM | POA: Diagnosis not present

## 2020-03-18 NOTE — Patient Instructions (Signed)
Medication Instructions:  Your physician recommends that you continue on your current medications as directed. Please refer to the Current Medication list given to you today.  *If you need a refill on your cardiac medications before your next appointment, please call your pharmacy*   Lab Work: None If you have labs (blood work) drawn today and your tests are completely normal, you will receive your results only by: . MyChart Message (if you have MyChart) OR . A paper copy in the mail If you have any lab test that is abnormal or we need to change your treatment, we will call you to review the results.   Testing/Procedures: None   Follow-Up: At [facility], you and your health needs are our priority.  As part of our continuing mission to provide you with exceptional heart care, we have created designated Provider Care Teams.  These Care Teams include your primary Cardiologist (physician) and Advanced Practice Providers (APPs -  Physician Assistants and Nurse Practitioners) who all work together to provide you with the care you need, when you need it.  We recommend signing up for the patient portal called "MyChart".  Sign up information is provided on this After Visit Summary.  MyChart is used to connect with patients for Virtual Visits (Telemedicine).  Patients are able to view lab/test results, encounter notes, upcoming appointments, etc.  Non-urgent messages can be sent to your provider as well.   To learn more about what you can do with MyChart, go to [email or web address].    Your next appointment:   9-12 month(s)  The format for your next appointment:   In Person  Provider:   You may see Henry W Smith III, MD or one of the following Advanced Practice Providers on your designated Care Team:    Lori Gerhardt, NP  Laura Ingold, NP  Jill McDaniel, NP    Other Instructions   

## 2020-03-25 DIAGNOSIS — N184 Chronic kidney disease, stage 4 (severe): Secondary | ICD-10-CM | POA: Diagnosis not present

## 2020-03-25 DIAGNOSIS — I5032 Chronic diastolic (congestive) heart failure: Secondary | ICD-10-CM | POA: Diagnosis not present

## 2020-03-25 DIAGNOSIS — R42 Dizziness and giddiness: Secondary | ICD-10-CM | POA: Diagnosis not present

## 2020-03-25 DIAGNOSIS — E1142 Type 2 diabetes mellitus with diabetic polyneuropathy: Secondary | ICD-10-CM | POA: Diagnosis not present

## 2020-03-25 DIAGNOSIS — Z7984 Long term (current) use of oral hypoglycemic drugs: Secondary | ICD-10-CM | POA: Diagnosis not present

## 2020-03-25 DIAGNOSIS — E1121 Type 2 diabetes mellitus with diabetic nephropathy: Secondary | ICD-10-CM | POA: Diagnosis not present

## 2020-04-15 ENCOUNTER — Ambulatory Visit (HOSPITAL_COMMUNITY)
Admission: RE | Admit: 2020-04-15 | Discharge: 2020-04-15 | Disposition: A | Discharge status: Home / Self Care | Payer: PPO | Source: Ambulatory Visit | Attending: Cardiology | Admitting: Cardiology

## 2020-04-15 ENCOUNTER — Other Ambulatory Visit: Payer: Self-pay

## 2020-04-15 ENCOUNTER — Telehealth (HOSPITAL_COMMUNITY): Payer: Self-pay | Admitting: Cardiology

## 2020-04-15 DIAGNOSIS — I5022 Chronic systolic (congestive) heart failure: Secondary | ICD-10-CM | POA: Diagnosis not present

## 2020-04-15 LAB — LIPID PANEL
Cholesterol: 151 mg/dL (ref 0–200)
HDL: 55 mg/dL (ref >40)
LDL Cholesterol: 78 mg/dL (ref 0–99)
Total CHOL/HDL Ratio: 2.7 RATIO
Triglycerides: 89 mg/dL (ref <150)
VLDL: 18 mg/dL (ref 0–40)

## 2020-04-15 LAB — HEPATIC FUNCTION PANEL
ALT: 16 U/L (ref 0–44)
AST: 22 U/L (ref 15–41)
Albumin: 3.5 g/dL (ref 3.5–5.0)
Alkaline Phosphatase: 48 U/L (ref 38–126)
Bilirubin, Direct: 0.1 mg/dL (ref 0.0–0.2)
Indirect Bilirubin: 0.5 mg/dL (ref 0.3–0.9)
Total Bilirubin: 0.6 mg/dL (ref 0.3–1.2)
Total Protein: 6.5 g/dL (ref 6.5–8.1)

## 2020-04-15 NOTE — Telephone Encounter (Signed)
During patients lab appt,patient requested to resubmit [name] order. Reports he was told his insurance was out of network and is willing to pay flat fee/co pay of $100  Advised would resubmit order with notes that patient is aware he is out of network and will pay fees to proceed with testing  Order, OV note, stop bang and demographics all faxed to [facility] at [phone]

## 2020-04-17 ENCOUNTER — Telehealth (HOSPITAL_COMMUNITY): Payer: Self-pay

## 2020-04-17 NOTE — Telephone Encounter (Signed)
Order, OV note, stop bang and demographics all faxed to [employer] at [phone]  

## 2020-04-24 DIAGNOSIS — I5022 Chronic systolic (congestive) heart failure: Secondary | ICD-10-CM | POA: Diagnosis not present

## 2020-04-24 DIAGNOSIS — E7800 Pure hypercholesterolemia, unspecified: Secondary | ICD-10-CM | POA: Diagnosis not present

## 2020-04-24 DIAGNOSIS — E1122 Type 2 diabetes mellitus with diabetic chronic kidney disease: Secondary | ICD-10-CM | POA: Diagnosis not present

## 2020-04-24 DIAGNOSIS — E1142 Type 2 diabetes mellitus with diabetic polyneuropathy: Secondary | ICD-10-CM | POA: Diagnosis not present

## 2020-04-24 DIAGNOSIS — D696 Thrombocytopenia, unspecified: Secondary | ICD-10-CM | POA: Diagnosis not present

## 2020-04-24 DIAGNOSIS — I700 Atherosclerosis of aorta: Secondary | ICD-10-CM | POA: Diagnosis not present

## 2020-04-24 DIAGNOSIS — Z79899 Other long term (current) drug therapy: Secondary | ICD-10-CM | POA: Diagnosis not present

## 2020-04-24 DIAGNOSIS — D692 Other nonthrombocytopenic purpura: Secondary | ICD-10-CM | POA: Diagnosis not present

## 2020-04-24 DIAGNOSIS — M545 Low back pain: Secondary | ICD-10-CM | POA: Diagnosis not present

## 2020-04-24 DIAGNOSIS — G8929 Other chronic pain: Secondary | ICD-10-CM | POA: Diagnosis not present

## 2020-04-24 DIAGNOSIS — M791 Myalgia, unspecified site: Secondary | ICD-10-CM | POA: Diagnosis not present

## 2020-04-24 DIAGNOSIS — N184 Chronic kidney disease, stage 4 (severe): Secondary | ICD-10-CM | POA: Diagnosis not present

## 2020-06-25 DIAGNOSIS — H0015 Chalazion left lower eyelid: Secondary | ICD-10-CM | POA: Diagnosis not present

## 2020-06-25 DIAGNOSIS — H04122 Dry eye syndrome of left lacrimal gland: Secondary | ICD-10-CM | POA: Diagnosis not present

## 2020-06-25 DIAGNOSIS — H0100A Unspecified blepharitis right eye, upper and lower eyelids: Secondary | ICD-10-CM | POA: Diagnosis not present

## 2020-06-25 DIAGNOSIS — H0100B Unspecified blepharitis left eye, upper and lower eyelids: Secondary | ICD-10-CM | POA: Diagnosis not present

## 2020-08-12 ENCOUNTER — Other Ambulatory Visit: Payer: Self-pay | Admitting: Interventional Cardiology

## 2020-08-17 ENCOUNTER — Other Ambulatory Visit (HOSPITAL_COMMUNITY): Payer: Self-pay | Admitting: Cardiology

## 2020-09-18 DIAGNOSIS — M79672 Pain in left foot: Secondary | ICD-10-CM | POA: Diagnosis not present

## 2020-09-18 DIAGNOSIS — E1142 Type 2 diabetes mellitus with diabetic polyneuropathy: Secondary | ICD-10-CM | POA: Diagnosis not present

## 2020-09-18 DIAGNOSIS — Z23 Encounter for immunization: Secondary | ICD-10-CM | POA: Diagnosis not present

## 2020-09-18 DIAGNOSIS — M19011 Primary osteoarthritis, right shoulder: Secondary | ICD-10-CM | POA: Diagnosis not present

## 2020-09-18 DIAGNOSIS — Z7984 Long term (current) use of oral hypoglycemic drugs: Secondary | ICD-10-CM | POA: Diagnosis not present

## 2020-09-27 DIAGNOSIS — L309 Dermatitis, unspecified: Secondary | ICD-10-CM | POA: Diagnosis not present

## 2020-09-27 DIAGNOSIS — L570 Actinic keratosis: Secondary | ICD-10-CM | POA: Diagnosis not present

## 2020-09-27 DIAGNOSIS — D0439 Carcinoma in situ of skin of other parts of face: Secondary | ICD-10-CM | POA: Diagnosis not present

## 2020-09-27 DIAGNOSIS — D0421 Carcinoma in situ of skin of right ear and external auricular canal: Secondary | ICD-10-CM | POA: Diagnosis not present

## 2020-09-27 DIAGNOSIS — Z85828 Personal history of other malignant neoplasm of skin: Secondary | ICD-10-CM | POA: Diagnosis not present

## 2020-09-27 DIAGNOSIS — C44229 Squamous cell carcinoma of skin of left ear and external auricular canal: Secondary | ICD-10-CM | POA: Diagnosis not present

## 2020-10-10 DIAGNOSIS — D0439 Carcinoma in situ of skin of other parts of face: Secondary | ICD-10-CM | POA: Diagnosis not present

## 2020-10-10 DIAGNOSIS — Z85828 Personal history of other malignant neoplasm of skin: Secondary | ICD-10-CM | POA: Diagnosis not present

## 2020-10-10 DIAGNOSIS — C44229 Squamous cell carcinoma of skin of left ear and external auricular canal: Secondary | ICD-10-CM | POA: Diagnosis not present

## 2020-10-10 DIAGNOSIS — L570 Actinic keratosis: Secondary | ICD-10-CM | POA: Diagnosis not present

## 2020-10-14 ENCOUNTER — Telehealth: Payer: Self-pay | Admitting: Interventional Cardiology

## 2020-10-14 ENCOUNTER — Other Ambulatory Visit: Payer: Self-pay | Admitting: *Deleted

## 2020-10-14 DIAGNOSIS — I5022 Chronic systolic (congestive) heart failure: Secondary | ICD-10-CM

## 2020-10-14 NOTE — Telephone Encounter (Signed)
Pt states that he purposely lost about 7-8lbs but gained 2lbs back.  He has been 191-192lbs for a few weeks now.

## 2020-10-14 NOTE — Telephone Encounter (Signed)
Pt states swelling has been worsening over the last few months.  Currently takes Torsemide 20mg  on Monday and Friday.  Improves with elevation but comes right back once he gets up.  BP is always good.  Didn't have readings with him but states SBP is always less than 120.  Stopped wearing compression stockings during the summer.  Advised pt to start using those again.  Also recently seen PCP due to Vertigo which he is treating with Meclizine.  Pt also told PCP that he has episodes where he feels shaky all over and fingertips become numb and cold.  Dr. Felipa Eth gave Gabapentin for this that seems to be helping but still has episodes.  Advised pt I will send to Dr. Tamala Julian to address swelling but he needs to reach back out to PCP about breakthrough neuropathy symptoms as he may require more Gabapentin.

## 2020-10-14 NOTE — Telephone Encounter (Signed)
Pt c/o swelling: STAT is pt has developed SOB within 24 hours  1) How much weight have you gained and in what time span? No   2) If swelling, where is the swelling located? feet  3) Are you currently taking a fluid pill? yes  4) Are you currently SOB? No   5) Do you have a log of your daily weights (if so, list)? no  6) Have you gained 3 pounds in a day or 5 pounds in a week? States he lost 4 to 5 lbs in the last two weeks.   7) Have you traveled recently? No  He states he feels like his body is shaking inside.

## 2020-10-14 NOTE — Telephone Encounter (Signed)
Observation for now.  Speak with primary care about tingling in hands and feet.

## 2020-10-14 NOTE — Telephone Encounter (Signed)
He gets dehydrated pretty easily if we use torsemide daily. We need some weights. At all thank that numbness and tingling in the fingers have anything to do with his heart. If truly volume overloaded, we could switch the torsemide to Monday, Wednesday, and Friday.  I need more data before making that decision.

## 2020-10-16 NOTE — Telephone Encounter (Signed)
Spoke with pt and made him aware of recommendations from Dr. Tamala Julian.  Advised to call back if swelling worsens or he notices weight gain.  Pt verbalized understanding and was appreciative for call.

## 2020-10-21 MED ORDER — TORSEMIDE 20 MG PO TABS: 20.0000 mg | ORAL_TABLET | ORAL | 1 refills | Status: DC

## 2020-10-21 NOTE — Telephone Encounter (Signed)
Patient's wife is calling to follow up. She states the patient's swelling has not gotten any better and medication does not seem to be helping.  Pt c/o swelling: STAT is pt has developed SOB within 24 hours  1) How much weight have you gained and in what time span? No significant weight gain  2) If swelling, where is the swelling located? Both feet  3) Are you currently taking a fluid pill? Yes   4) Are you currently SOB? No   5) Do you have a log of your daily weights (if so, list)? No  6) Have you gained 3 pounds in a day or 5 pounds in a week? No   7) Have you traveled recently? No

## 2020-10-21 NOTE — Telephone Encounter (Signed)
Increase torsemide to 20 mg daily until edema resolves.  Then start taking torsemide 20 mg Monday, Wednesday, and Friday.  Needs basic metabolic panel in 1 week.

## 2020-10-21 NOTE — Telephone Encounter (Signed)
Will send to Dr. Tamala Julian for review and advisement.  Pt still having swelling in both feet.  No improvement since he called on 10/27 and was told to observe.  States BPs are always good but didn't have readings with him.  Currently takes Torsemide 20mg  on Monday and Friday.

## 2020-10-21 NOTE — Addendum Note (Signed)
Addended by: Loren Racer on: 10/21/2020 12:58 PM   Modules accepted: Orders

## 2020-10-21 NOTE — Telephone Encounter (Signed)
Spoke with pt and made him aware of recommendations per Dr. Tamala Julian.  Pt will come for labs on 11/9.  Pt verbalized understanding and was in agreement with plan.

## 2020-10-23 ENCOUNTER — Ambulatory Visit: Payer: PPO | Attending: Geriatric Medicine | Admitting: Physical Therapy

## 2020-10-23 ENCOUNTER — Encounter: Payer: Self-pay | Admitting: Physical Therapy

## 2020-10-23 ENCOUNTER — Other Ambulatory Visit: Payer: Self-pay

## 2020-10-23 DIAGNOSIS — R2689 Other abnormalities of gait and mobility: Secondary | ICD-10-CM | POA: Insufficient documentation

## 2020-10-23 DIAGNOSIS — R2681 Unsteadiness on feet: Secondary | ICD-10-CM | POA: Diagnosis not present

## 2020-10-23 NOTE — Therapy (Addendum)
[facility] [address] [place], [place], [address] Phone: [phone]   Fax:  [phone]  Physical Therapy Evaluation and Discharge  Patient Details  Name: [name] MRN: [id number] Date of Birth: [birth year] Referring Provider (PT): Lajean Manes, MD   PHYSICAL THERAPY DISCHARGE SUMMARY  Visits from Start of Care: 1  Current functional level related to goals / functional outcomes: Did not return for follow-up appointments   Remaining deficits: See below   Education / Equipment: See below  Plan:                                                    Patient goals were not met. Patient is being discharged due to not returning since the last visit.  ?????        Encounter Date: 10/23/2020   PT End of Session - 10/23/20 1229    Visit Number 1    Number of Visits 5    Date for PT Re-Evaluation 11/20/20    PT Start Time 1638    PT Stop Time 1230    PT Time Calculation (min) 45 min    Activity Tolerance Patient tolerated treatment well    Behavior During Therapy [facility] for tasks assessed/performed           Past Medical History:  Diagnosis Date  . Chondromalacia   . Chronic systolic CHF (congestive heart failure) ([place])   . Coronary artery disease    a. 2007: s/p CABG x3V (LIMA--> LAD, SVG--> PDA, SVG--> ramus)  c. 2017: LHC with 2/3 patent bypass grafts with occuluded SVG--> Ramus   . Diabetes mellitus ([place])   . GERD (gastroesophageal reflux disease)   . Glaucoma    "high normal"  . Gout   . H/O blood clots    R Lower leg, right upper leg  . History of renal cell cancer   . Hyperlipidemia   . Hypertension   . Joint pain   . Low back pain    "severe"  . Obesity   . Osteoporosis   . Polymyalgia ([place])    h/o  . Prostate cancer ([place])   . Severe aortic stenosis    a. 06/2017: s/p TAVR  . Spinal stenosis   . Thrombophlebitis   . Tubular adenoma     Past Surgical History:  Procedure Laterality  Date  . APPENDECTOMY  60 years ago  . BACK SURGERY    . CARDIAC CATHETERIZATION    . CARDIAC CATHETERIZATION N/A 11/03/2016   Procedure: Right/Left Heart Cath and Coronary/Graft Angiography;  Surgeon: Belva Crome, MD;  Location: [facility];  Service: Cardiovascular;  Laterality: N/A;  . CORONARY ARTERY BYPASS GRAFT  2007  . EYE SURGERY Right    /w IOL, post cataracts   . PROSTATE SURGERY     seed implant  . ROBOTIC ASSITED PARTIAL NEPHRECTOMY Left 08/31/2013   Procedure: ROBOTIC ASSITED PARTIAL NEPHRECTOMY;  Surgeon: Dutch Gray, MD;  Location: [facility];  Service: Urology;  Laterality: Left;  . TEE WITHOUT CARDIOVERSION N/A 06/22/2017   Procedure: TRANSESOPHAGEAL ECHOCARDIOGRAM (TEE);  Surgeon: Sherren Mocha, MD;  Location: [facility];  Service: Open Heart Surgery;  Laterality: N/A;  . TRANSCATHETER AORTIC VALVE REPLACEMENT, TRANSFEMORAL N/A 06/22/2017   Procedure: TRANSCATHETER AORTIC VALVE REPLACEMENT, TRANSFEMORAL;  Surgeon: Sherren Mocha, MD;  Location:  [facility];  Service: Open Heart Surgery;  Laterality: N/A;  . VEIN LIGATION AND STRIPPING Right 1972   right lower leg    There were no vitals filed for this visit.    Subjective Assessment - 10/23/20 1148    Subjective Back surgery in 2016 (L2-3, 4-5) with multiple complications/infections; however, he notes that dizziness started 3 weeks ago. Pt notes he can't control his balance and feels "wobbly." Got up in the morning 3 weeks ago and had to stop because he felt bad. It has been easing up in the last couple days. He normally does not have to use a cane (typically uses it occasionally for when his back flares up). Pt was given medication by his PCP but doesn't seem to have helped. Pt also saw his cardiologist and urologist. Has been needing help to get to bathroom by himself until recently when it has been improving.    Patient is accompained by: Family member   Wife   Pertinent History Back surgery 2016 with ongoing back pain, multiple  skin cancers removed, CHF, diabetes, GERD, glaucoma, h/o blood clots, HTN, CABG, cardiac cath, prostate surgery    Limitations Standing;Walking;House hold activities;Lifting    Patient Stated Goals Make dizziness go away; go back to walking; runs used car lot    Currently in Pain? No/denies              [facility] PT Assessment - 10/23/20 0001      Assessment   Medical Diagnosis R42 (ICD-10-CM) - Dizziness and giddiness    Referring Provider (PT) Lajean Manes, MD    Hand Dominance Right    Prior Therapy 2013 after back surgery and 2018 & 2019      Balance Screen   Has the patient fallen in the past 6 months No    Has the patient had a decrease in activity level because of a fear of falling?  Yes    Is the patient reluctant to leave their home because of a fear of falling?  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      Observation/Other Assessments   Focus on Therapeutic Outcomes (FOTO)  48% impairment    Other Surveys  Dizziness Handicap Inventory ([place])    Dizziness Handicap Inventory ([place])  78      Ambulation/Gait   Gait velocity 17.53      Standardized Balance Assessment   Standardized Balance Assessment Dynamic Gait Index;Five Times Sit to Stand    Five times sit to stand comments  12.72      Dynamic Gait Index   Level Surface Mild Impairment    Change in Gait Speed Mild Impairment    Gait with Horizontal Head Turns Mild Impairment    Gait with Vertical Head Turns Mild Impairment    Gait and Pivot Turn  Mild Impairment    Step Over Obstacle Moderate Impairment  Step Around Obstacles Mild Impairment    Steps Mild Impairment    Total Score 15    DGI comment: 15/24                  Vestibular Assessment - 10/23/20 0001      Symptom Behavior   Type of Dizziness  "Funny feeling in head";Unsteady with head/body turns;Imbalance   "swimmy headed" "get up to walk but body goes sideways"   Frequency of Dizziness Every time he gets up    Duration of Dizziness Few minutes    Symptom Nature Motion provoked    Aggravating Factors Activity in general;Mornings;Turning head quickly;Turning head sideways;Sit to stand   Feels mostly when he's on his feet   Relieving Factors Head stationary;Closing eyes    Progression of Symptoms Better      Oculomotor Exam   Ocular ROM WFL    Spontaneous Absent    Gaze-induced  Absent    Head shaking Horizontal Absent    Head Shaking Vertical Absent    Smooth Pursuits Intact    Saccades Intact    Comment HIT: WFL      Vestibulo-Ocular Reflex   VOR 1 Head Only (x 1 viewing) WFL    VOR to Slow Head Movement Normal    VOR Cancellation Normal              Objective measurements completed on examination: See above findings.               PT Education - 10/23/20 1437    Education Details Exam findings and POC    Person(s) Educated Patient;Spouse    Methods Explanation    Comprehension Verbalized understanding;Returned demonstration               PT Long Term Goals - 10/23/20 1444      PT LONG TERM GOAL #1   Title Pt will be independent with his HEP    Time 4    Period Weeks    Status New    Target Date 11/20/20      PT LONG TERM GOAL #2   Title Pt will have improved FOTO score to </= 27% impairment    Baseline 48% impairment on FOTO (10/23/20)    Time 4    Period Weeks    Status New    Target Date 11/20/20      PT LONG TERM GOAL #3   Title Pt will have improved DGI score to at least 20/24 for reduced fall risk     Baseline 15/24 on eval (10/23/20)    Time 4    Period Weeks    Status New    Target Date 11/20/20      PT LONG TERM GOAL #4   Title Pt will have improved DHI score to at least 60/100 to have moderate impairment    Baseline 78/100 "severe" on [place] (10/23/20)    Time 4    Period Weeks    Status New    Target Date 11/20/20                  Plan - 10/23/20 1438    Clinical Impression Statement Mr. [name] is an 86 y/o M presenting to OPPT due to complaint of dizziness x3 weeks. At this time, the dizziness has been resolving; however, pt still feels off balance and is requiring a cane for safety. On assessment, no overt vertiginous symptoms or nystagmus were illicited. Dix-Hallpike testing was (-)  bilat. Pt does demonstrate decreased balance, decreased activity tolerance, and is a high fall risk based on his gait speed and DGI score. At baseline, pt does have chronic back pain that affects how far he is able to walk. Pt would benefit from PT to hasten his recovery to his PLOF for return to work and home tasks safely and reduced fall risk.    Personal Factors and Comorbidities Age;Comorbidity 1;Time since onset of injury/illness/exacerbation;Comorbidity 2;Comorbidity 3+    Comorbidities diabetes, CABG, cancers, has had recent edema that has also been resolving, chronic back pain from surgery in 2013    Examination-Activity Limitations Hygiene/Grooming;Bend;Carry;Caring for Others;Dressing;Locomotion Level;Toileting;Squat;Transfers;Stand;Stairs    Examination-Participation Restrictions Occupation;Community Activity;Driving    Stability/Clinical Decision Making Evolving/Moderate complexity    Clinical Decision Making Low    Rehab Potential Fair    PT Frequency 1x / week    PT Duration 4 weeks    PT Treatment/Interventions ADLs/Self Care Home Management;Canalith Repostioning;Moist Heat;Ultrasound;DME Instruction;Gait training;Stair training;Functional mobility training;Therapeutic  activities;Therapeutic exercise;Balance training;Neuromuscular re-education;Patient/family education;Manual techniques;Taping;Vestibular    PT Next Visit Plan Initiate balance and strengthening HEP. Has his dizziness continued to decrease or any flare ups?    Consulted and Agree with Plan of Care Patient;Family member/caregiver    Family Member Consulted Wife           Patient will benefit from skilled therapeutic intervention in order to improve the following deficits and impairments:  Decreased balance, Decreased endurance, Abnormal gait, Difficulty walking, Dizziness, Decreased activity tolerance, Decreased strength, Pain  Visit Diagnosis: Unsteadiness on feet  Other abnormalities of gait and mobility     Problem List Patient Active Problem List   Diagnosis Date Noted  . History of renal cell cancer   . Diabetes mellitus ([place])   . Prostate cancer ([place])   . Coronary artery disease   . Chronic systolic CHF (congestive heart failure) ([place])   . 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 Acute on chronic systolic heart failure ([place]) 11/02/2016  . Discitis 12/22/2015  . 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 CKD (chronic kidney disease), stage III ([place]) 10/26/2015  . Essential hypertension 02/11/2015  . Hyperlipidemia   . Myocardial infarction   . GERD (gastroesophageal reflux disease)   . H/O blood clots   . Coronary artery disease involving coronary bypass graft of native heart without angina pectoris   . Low back pain   . Polymyalgia ([place])   . Gout   . Glaucoma     Kailani Brass April Ma L Danh Bayus PT, DPT 10/23/2020, 5:41 PM  [facility] [address] [place], [place], [address] Phone: [phone]   Fax:  [phone]  Name: [name] MRN: [id number] Date of Birth: 1934/10/10

## 2020-10-25 ENCOUNTER — Other Ambulatory Visit: Payer: Self-pay | Admitting: Geriatric Medicine

## 2020-10-25 DIAGNOSIS — E7800 Pure hypercholesterolemia, unspecified: Secondary | ICD-10-CM | POA: Diagnosis not present

## 2020-10-25 DIAGNOSIS — N1832 Chronic kidney disease, stage 3b: Secondary | ICD-10-CM | POA: Diagnosis not present

## 2020-10-25 DIAGNOSIS — I5032 Chronic diastolic (congestive) heart failure: Secondary | ICD-10-CM | POA: Diagnosis not present

## 2020-10-25 DIAGNOSIS — M545 Low back pain, unspecified: Secondary | ICD-10-CM

## 2020-10-25 DIAGNOSIS — I5022 Chronic systolic (congestive) heart failure: Secondary | ICD-10-CM | POA: Diagnosis not present

## 2020-10-25 DIAGNOSIS — Z7984 Long term (current) use of oral hypoglycemic drugs: Secondary | ICD-10-CM | POA: Diagnosis not present

## 2020-10-25 DIAGNOSIS — D539 Nutritional anemia, unspecified: Secondary | ICD-10-CM | POA: Diagnosis not present

## 2020-10-25 DIAGNOSIS — E1142 Type 2 diabetes mellitus with diabetic polyneuropathy: Secondary | ICD-10-CM | POA: Diagnosis not present

## 2020-10-25 DIAGNOSIS — M546 Pain in thoracic spine: Secondary | ICD-10-CM | POA: Diagnosis not present

## 2020-10-25 DIAGNOSIS — Z79899 Other long term (current) drug therapy: Secondary | ICD-10-CM | POA: Diagnosis not present

## 2020-10-25 DIAGNOSIS — G8929 Other chronic pain: Secondary | ICD-10-CM | POA: Diagnosis not present

## 2020-10-29 ENCOUNTER — Telehealth: Payer: Self-pay | Admitting: Interventional Cardiology

## 2020-10-29 ENCOUNTER — Ambulatory Visit: Payer: PPO | Admitting: Physical Therapy

## 2020-10-29 ENCOUNTER — Other Ambulatory Visit: Payer: PPO

## 2020-10-29 NOTE — Telephone Encounter (Signed)
Spoke with wife and inquired about how long pt took Torsemide daily.  She asked pt and he said he took it everyday from 11/1-11/6 and then started the MWF regimen this Monday.  Advised the labs from 11/4 were a little too soon then since he continued to take Torsemide daily after that.  Pt will come tomorrow to have BMET checked.

## 2020-10-29 NOTE — Telephone Encounter (Signed)
Patient's wife cancelled lab work scheduled for today. She states the patient had lab work [facility]) completed on 10/24/20 with patient's PCP, Dr. Felipa Eth, and the results were faxed to our office on 10/27/20. She requested a call back to confirm that it was alright to cancel.

## 2020-10-30 ENCOUNTER — Other Ambulatory Visit: Payer: Self-pay

## 2020-10-30 ENCOUNTER — Other Ambulatory Visit: Payer: PPO

## 2020-10-30 DIAGNOSIS — Z79899 Other long term (current) drug therapy: Secondary | ICD-10-CM | POA: Diagnosis not present

## 2020-10-30 DIAGNOSIS — I5022 Chronic systolic (congestive) heart failure: Secondary | ICD-10-CM

## 2020-10-30 LAB — BASIC METABOLIC PANEL
BUN/Creatinine Ratio: 14 (ref 10–24)
BUN: 25 mg/dL (ref 8–27)
CO2: 27 mmol/L (ref 20–29)
Calcium: 9.6 mg/dL (ref 8.6–10.2)
Chloride: 102 mmol/L (ref 96–106)
Creatinine, Ser: 1.77 mg/dL — ABNORMAL HIGH (ref 0.76–1.27)
GFR calc Af Amer: 40 mL/min/{1.73_m2} — ABNORMAL LOW (ref >59)
GFR calc non Af Amer: 35 mL/min/{1.73_m2} — ABNORMAL LOW (ref >59)
Glucose: 211 mg/dL — ABNORMAL HIGH (ref 65–99)
Potassium: 3.9 mmol/L (ref 3.5–5.2)
Sodium: 141 mmol/L (ref 134–144)

## 2020-10-31 ENCOUNTER — Telehealth: Payer: Self-pay | Admitting: Interventional Cardiology

## 2020-10-31 NOTE — Telephone Encounter (Signed)
Spoke with wife and made her aware that I am waiting on Dr. Tamala Julian to review those labs and I will be in contact once he does.  Wife appreciative for call.

## 2020-10-31 NOTE — Telephone Encounter (Signed)
New message:     Patient and wife call to get patient results. Please call patient back.

## 2020-11-04 ENCOUNTER — Telehealth: Payer: Self-pay | Admitting: Interventional Cardiology

## 2020-11-04 NOTE — Telephone Encounter (Signed)
Nurse called patient.

## 2020-11-06 ENCOUNTER — Ambulatory Visit: Payer: PPO | Admitting: Physical Therapy

## 2020-11-11 DIAGNOSIS — Z1389 Encounter for screening for other disorder: Secondary | ICD-10-CM | POA: Diagnosis not present

## 2020-11-11 DIAGNOSIS — E1122 Type 2 diabetes mellitus with diabetic chronic kidney disease: Secondary | ICD-10-CM | POA: Diagnosis not present

## 2020-11-11 DIAGNOSIS — E611 Iron deficiency: Secondary | ICD-10-CM | POA: Diagnosis not present

## 2020-11-11 DIAGNOSIS — E7800 Pure hypercholesterolemia, unspecified: Secondary | ICD-10-CM | POA: Diagnosis not present

## 2020-11-11 DIAGNOSIS — Z79899 Other long term (current) drug therapy: Secondary | ICD-10-CM | POA: Diagnosis not present

## 2020-11-11 DIAGNOSIS — I700 Atherosclerosis of aorta: Secondary | ICD-10-CM | POA: Diagnosis not present

## 2020-11-11 DIAGNOSIS — N1832 Chronic kidney disease, stage 3b: Secondary | ICD-10-CM | POA: Diagnosis not present

## 2020-11-11 DIAGNOSIS — E1142 Type 2 diabetes mellitus with diabetic polyneuropathy: Secondary | ICD-10-CM | POA: Diagnosis not present

## 2020-11-11 DIAGNOSIS — Z0000 Encounter for general adult medical examination without abnormal findings: Secondary | ICD-10-CM | POA: Diagnosis not present

## 2020-11-11 DIAGNOSIS — M810 Age-related osteoporosis without current pathological fracture: Secondary | ICD-10-CM | POA: Diagnosis not present

## 2020-11-11 DIAGNOSIS — I340 Nonrheumatic mitral (valve) insufficiency: Secondary | ICD-10-CM | POA: Diagnosis not present

## 2020-11-11 DIAGNOSIS — I5032 Chronic diastolic (congestive) heart failure: Secondary | ICD-10-CM | POA: Diagnosis not present

## 2020-11-11 DIAGNOSIS — K5901 Slow transit constipation: Secondary | ICD-10-CM | POA: Diagnosis not present

## 2020-11-12 ENCOUNTER — Ambulatory Visit: Payer: PPO | Admitting: Physical Therapy

## 2020-11-12 DIAGNOSIS — M546 Pain in thoracic spine: Secondary | ICD-10-CM | POA: Diagnosis not present

## 2020-11-12 DIAGNOSIS — M5137 Other intervertebral disc degeneration, lumbosacral region: Secondary | ICD-10-CM | POA: Diagnosis not present

## 2020-11-12 DIAGNOSIS — M5136 Other intervertebral disc degeneration, lumbar region: Secondary | ICD-10-CM | POA: Diagnosis not present

## 2020-11-12 DIAGNOSIS — M5135 Other intervertebral disc degeneration, thoracolumbar region: Secondary | ICD-10-CM | POA: Diagnosis not present

## 2020-11-12 DIAGNOSIS — M545 Low back pain, unspecified: Secondary | ICD-10-CM | POA: Diagnosis not present

## 2020-11-12 DIAGNOSIS — M5134 Other intervertebral disc degeneration, thoracic region: Secondary | ICD-10-CM | POA: Diagnosis not present

## 2020-11-19 ENCOUNTER — Ambulatory Visit: Payer: PPO | Admitting: Physical Therapy

## 2020-11-22 DIAGNOSIS — I70201 Unspecified atherosclerosis of native arteries of extremities, right leg: Secondary | ICD-10-CM | POA: Diagnosis not present

## 2020-11-22 DIAGNOSIS — E119 Type 2 diabetes mellitus without complications: Secondary | ICD-10-CM | POA: Diagnosis not present

## 2020-11-22 DIAGNOSIS — E1122 Type 2 diabetes mellitus with diabetic chronic kidney disease: Secondary | ICD-10-CM | POA: Diagnosis not present

## 2020-11-25 DIAGNOSIS — D509 Iron deficiency anemia, unspecified: Secondary | ICD-10-CM | POA: Diagnosis not present

## 2020-11-26 ENCOUNTER — Other Ambulatory Visit: Payer: Self-pay

## 2020-11-26 ENCOUNTER — Other Ambulatory Visit: Payer: PPO | Admitting: *Deleted

## 2020-11-26 ENCOUNTER — Other Ambulatory Visit: Payer: Self-pay | Admitting: *Deleted

## 2020-11-26 DIAGNOSIS — I5022 Chronic systolic (congestive) heart failure: Secondary | ICD-10-CM | POA: Diagnosis not present

## 2020-11-26 LAB — BASIC METABOLIC PANEL
BUN/Creatinine Ratio: 17 (ref 10–24)
BUN: 30 mg/dL — ABNORMAL HIGH (ref 8–27)
CO2: 27 mmol/L (ref 20–29)
Calcium: 10.2 mg/dL (ref 8.6–10.2)
Chloride: 101 mmol/L (ref 96–106)
Creatinine, Ser: 1.75 mg/dL — ABNORMAL HIGH (ref 0.76–1.27)
GFR calc Af Amer: 40 mL/min/{1.73_m2} — ABNORMAL LOW (ref >59)
GFR calc non Af Amer: 35 mL/min/{1.73_m2} — ABNORMAL LOW (ref >59)
Glucose: 140 mg/dL — ABNORMAL HIGH (ref 65–99)
Potassium: 3.9 mmol/L (ref 3.5–5.2)
Sodium: 144 mmol/L (ref 134–144)

## 2020-12-04 DIAGNOSIS — M545 Low back pain, unspecified: Secondary | ICD-10-CM | POA: Diagnosis not present

## 2020-12-04 DIAGNOSIS — M47896 Other spondylosis, lumbar region: Secondary | ICD-10-CM | POA: Diagnosis not present

## 2020-12-06 ENCOUNTER — Other Ambulatory Visit: Payer: Self-pay | Admitting: Geriatric Medicine

## 2020-12-06 DIAGNOSIS — M810 Age-related osteoporosis without current pathological fracture: Secondary | ICD-10-CM

## 2020-12-10 DIAGNOSIS — M47896 Other spondylosis, lumbar region: Secondary | ICD-10-CM | POA: Diagnosis not present

## 2020-12-10 DIAGNOSIS — M47816 Spondylosis without myelopathy or radiculopathy, lumbar region: Secondary | ICD-10-CM | POA: Diagnosis not present

## 2020-12-27 DIAGNOSIS — M47896 Other spondylosis, lumbar region: Secondary | ICD-10-CM | POA: Diagnosis not present

## 2020-12-27 DIAGNOSIS — R2689 Other abnormalities of gait and mobility: Secondary | ICD-10-CM | POA: Diagnosis not present

## 2020-12-27 DIAGNOSIS — M6281 Muscle weakness (generalized): Secondary | ICD-10-CM | POA: Diagnosis not present

## 2020-12-27 DIAGNOSIS — G8929 Other chronic pain: Secondary | ICD-10-CM | POA: Diagnosis not present

## 2020-12-30 DIAGNOSIS — M47816 Spondylosis without myelopathy or radiculopathy, lumbar region: Secondary | ICD-10-CM | POA: Diagnosis not present

## 2021-01-01 DIAGNOSIS — H35373 Puckering of macula, bilateral: Secondary | ICD-10-CM | POA: Diagnosis not present

## 2021-01-01 DIAGNOSIS — C61 Malignant neoplasm of prostate: Secondary | ICD-10-CM | POA: Diagnosis not present

## 2021-01-01 DIAGNOSIS — H40011 Open angle with borderline findings, low risk, right eye: Secondary | ICD-10-CM | POA: Diagnosis not present

## 2021-01-01 DIAGNOSIS — E119 Type 2 diabetes mellitus without complications: Secondary | ICD-10-CM | POA: Diagnosis not present

## 2021-01-01 DIAGNOSIS — Z961 Presence of intraocular lens: Secondary | ICD-10-CM | POA: Diagnosis not present

## 2021-01-01 DIAGNOSIS — H25812 Combined forms of age-related cataract, left eye: Secondary | ICD-10-CM | POA: Diagnosis not present

## 2021-01-02 NOTE — Progress Notes (Signed)
Cardiology Office Note:    Date:  01/03/2021   ID:  [name], DOB [birth year], MRN [id number]  PCP:  Lajean Manes, MD  Cardiologist:  Sinclair Grooms, MD   Referring MD: Lajean Manes, MD   Chief Complaint  Patient presents with  . Congestive Heart Failure  . Cardiac Valve Problem    History of Present Illness:    [name] is a 87 y.o. 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  His LVEF has improved since TAVR.  He has self discontinued carvedilol because of low blood pressure.  He is very volume sensitive and has low blood pressures if he takes diuretics every day.  He has chronic right and left lower extremity swelling.  He did have TAVR.  He is having neurologic complaints with numbness and weakness in his hands, possibly carpal tunnel.  He has had some neurocognitive issues.  He will be seeing neurology soon.  10 weeks ago he felt bad, was having dizziness/vertigo, developed decreased hearing and has some decreased eyesight since that episode.  He subsequently feels somewhat better.  He has bilateral numbness in his hands.  B12 has been started.  No significant improvement.  Past Medical History:  Diagnosis Date  . Chondromalacia   . Chronic systolic CHF (congestive heart failure) ([place])   . Coronary artery disease    a. 2007: s/p CABG x3V (LIMA--> LAD, SVG--> PDA, SVG--> ramus)  c. 2017: LHC with 2/3 patent bypass grafts with occuluded SVG--> Ramus   . Diabetes mellitus ([place])   . GERD (gastroesophageal reflux disease)   . Glaucoma    "high normal"  . Gout   . H/O blood clots    R Lower leg, right upper leg  . History of renal cell cancer   . Hyperlipidemia   . Hypertension   . Joint pain   . Low back pain    "severe"  . Obesity   . Osteoporosis   . Polymyalgia ([place])    h/o  . Prostate cancer ([place])   . Severe aortic stenosis     a. 06/2017: s/p TAVR  . Spinal stenosis   . Thrombophlebitis   . Tubular adenoma     Past Surgical History:  Procedure Laterality Date  . APPENDECTOMY  60 years ago  . BACK SURGERY    . CARDIAC CATHETERIZATION    . CARDIAC CATHETERIZATION N/A 11/03/2016   Procedure: Right/Left Heart Cath and Coronary/Graft Angiography;  Surgeon: Belva Crome, MD;  Location: [facility];  Service: Cardiovascular;  Laterality: N/A;  . CORONARY ARTERY BYPASS GRAFT  2007  . EYE SURGERY Right    /w IOL, post cataracts   . PROSTATE SURGERY     seed implant  . ROBOTIC ASSITED PARTIAL NEPHRECTOMY Left 08/31/2013   Procedure: ROBOTIC ASSITED PARTIAL NEPHRECTOMY;  Surgeon: Dutch Gray, MD;  [facility];  Service: Urology;  Laterality: Left;  . TEE WITHOUT CARDIOVERSION N/A 06/22/2017   Procedure: TRANSESOPHAGEAL ECHOCARDIOGRAM (TEE);  Surgeon: Sherren Mocha, MD;  Location: [facility];  Service: Open Heart Surgery;  Laterality: N/A;  . TRANSCATHETER AORTIC VALVE REPLACEMENT, TRANSFEMORAL N/A 06/22/2017   Procedure: TRANSCATHETER AORTIC VALVE REPLACEMENT, TRANSFEMORAL;  Surgeon: Sherren Mocha, MD;  Location: [facility];  Service: Open Heart Surgery;  Laterality: N/A;  . VEIN LIGATION AND STRIPPING Right 1972   right lower leg  Current Medications: Current Meds  Medication Sig  . allopurinol (ZYLOPRIM) 300 MG tablet Take 300 mg by mouth daily.  Marland Kitchen aspirin 81 MG chewable tablet Chew 81 mg by mouth every evening.   . Calcium Carbonate-Vitamin D 600-200 MG-UNIT TABS Take 1 tablet by mouth daily.  . clindamycin (CLEOCIN) 300 MG capsule Take 2 capsules by mouth one hour prior to dental appointment  . Cyanocobalamin (VITAMIN B12) 1000 MCG TBCR Take 1,000 mcg by mouth 3 (three) times a week.  . ferrous sulfate 325 (65 FE) MG EC tablet Take 325 mg by mouth once a week.  . gabapentin (NEURONTIN) 100 MG capsule Take 100 mg by mouth 3 (three) times daily.  Marland Kitchen glipiZIDE (GLUCOTROL) 10 MG tablet Take 1 tablet by mouth  every morning and 1/2 tablet by mouth every evening  . HYDROcodone-acetaminophen (NORCO) 10-325 MG tablet Take 1-2 tablets by mouth every 6 (six) hours as needed (pain).  . indomethacin (INDOCIN) 25 MG capsule Take 25 mg by mouth daily as needed.  . Multiple Vitamin (MULTIVITAMIN) tablet Take 1 tablet by mouth daily.  . predniSONE (DELTASONE) 5 MG tablet Take 5 mg by mouth daily with breakfast.   . rosuvastatin (CRESTOR) 20 MG tablet TAKE 1 TABLET BY MOUTH DAILY  . tamsulosin (FLOMAX) 0.4 MG CAPS capsule Take 0.4 mg by mouth daily after supper.  . torsemide (DEMADEX) 20 MG tablet Take 1 tablet (20 mg total) by mouth every Monday, Wednesday, and Friday.     Allergies:   Fish allergy, Lipitor [atorvastatin], Penicillins, Simvastatin, Adacel [tetanus-diphth-acell pertussis], Lasix [furosemide], Septra [sulfamethoxazole-trimethoprim], Flexeril [cyclobenzaprine], and Pantoprazole   Social History   Socioeconomic History  . Marital status: Married    Spouse name: Not on file  . Number of children: Not on file  . Years of education: Not on file  . Highest education level: Not on file  Occupational History  . Not on file  Tobacco Use  . Smoking status: Former Smoker    Packs/day: 3.00    Years: 30.00    Pack years: 90.00    Types: Cigarettes    Quit date: 12/21/1980    Years since quitting: 40.0  . Smokeless tobacco: Never Used  Vaping Use  . Vaping Use: Never used  Substance and Sexual Activity  . Alcohol use: No  . Drug use: No  . Sexual activity: Not on file  Other Topics Concern  . Not on file  Social History Narrative  . Not on file   Social Determinants of Health   Financial Resource Strain: Not on file  Food Insecurity: Not on file  Transportation Needs: Not on file  Physical Activity: Not on file  Stress: Not on file  Social Connections: Not on file     Family History: The patient's family history includes Heart Problems in his father and mother.  ROS:   Please  see the history of present illness.    Feels something is not right.  He has chronic back pain.  Having numbness and tingling, fleeting pain.  Volume sensitive to diuretics.  Blood pressure gets low if he takes his diuretic more than once or twice a week.  All other systems reviewed and are negative.  EKGs/Labs/Other Studies Reviewed:    The following studies were reviewed today:  2D Doppler echocardiogram May 2021: IMPRESSIONS    1. Left ventricular ejection fraction, by estimation, is 55 to 60% (up from 40% in 2018). The  left ventricle has normal function. The left ventricle  has no regional  wall motion abnormalities. Left ventricular diastolic parameters are  consistent with Grade I diastolic  dysfunction (impaired relaxation).  2. Right ventricular systolic function is normal. The right ventricular  size is normal.  3. The mitral valve is normal in structure and function. No evidence of  mitral valve regurgitation. No evidence of mitral stenosis.  4. The aortic valve has been repaired/replaced. Aortic valve  regurgitation is not visualized.   EKG:  EKG normal sinus rhythm, interventricular conduction delay with left bundle branch block type appearance..  When compared to February 2021, the heart rate is currently faster but otherwise no change.  Recent Labs: 04/15/2020: ALT 16 11/26/2020: BUN 30; Creatinine, Ser 1.75; Potassium 3.9; Sodium 144  Recent Lipid Panel    Component Value Date/Time   CHOL 151 04/15/2020 1026   TRIG 89 04/15/2020 1026   HDL 55 04/15/2020 1026   CHOLHDL 2.7 04/15/2020 1026   VLDL 18 04/15/2020 1026   LDLCALC 78 04/15/2020 1026    Physical Exam:    VS:  BP 122/66   Pulse 81   Ht 5\' 5"  (1.651 m)   Wt 189 lb 12.8 oz (86.1 kg)   SpO2 95%   BMI 31.58 kg/m     Wt Readings from Last 3 Encounters:  01/03/21 189 lb 12.8 oz (86.1 kg)  03/18/20 199 lb 12.8 oz (90.6 kg)  02/12/20 203 lb 6.4 oz (92.3 kg)     GEN: Obese. No acute  distress HEENT: Normal NECK: No JVD. LYMPHATICS: No lymphadenopathy CARDIAC: No murmur. RRR with no gallop, and 2+ bilateral ankle edema. VASCULAR:  Normal Pulses. No bruits. RESPIRATORY:  Clear to auscultation without rales, wheezing or rhonchi  ABDOMEN: Soft, non-tender, non-distended, No pulsatile mass, MUSCULOSKELETAL: No deformity  SKIN: Warm and dry NEUROLOGIC:  Alert and oriented x 3 PSYCHIATRIC:  Normal affect   ASSESSMENT:    1. Chronic systolic CHF (congestive heart failure) ([place])   2. Coronary artery disease involving coronary bypass graft of native heart without angina pectoris   3. S/P TAVR (transcatheter aortic valve replacement)   4. Stage 3 chronic kidney disease, unspecified whether stage 3a or 3b CKD ([place])   5. Pure hypercholesterolemia   6. Snoring   7. Educated about COVID-19 virus infection   8. Essential hypertension   9. Type 2 diabetes mellitus with other circulatory complication, with long-term current use of insulin (HCC)    PLAN:    In order of problems listed above:  1. He is off beta-blocker therapy but LV function is normal. 2. Continue risk preventive measures. 3. We will do a technetium pyrophosphate perfusion scan to rule out amyloidosis especially given possible autonomic dysfunction and carpal tunnel syndrome bilaterally.  There is an increased incidence of amyloid in the setting of aortic stenosis. 4. Did not discuss 5. Continue statin therapy 6. Did not discuss sleep apnea 7. Vaccinated, boosted, and practicing mitigation. 8. He used to have high blood pressure.  Now he is very sensitive to any medications that lower the blood pressure, suggesting he has significant autonomic dysfunction. 9. We did not discuss his diabetes.  I think he needs to see a neurologist.  The technician pyrophosphate scan may help [place] rule out cardiac amyloid and may help further explain many of his other complaints.  We will discontinue carvedilol because it was  lowering his blood pressure too much.   Medication Adjustments/Labs and Tests Ordered: Current medicines are reviewed at length with the patient today.  Concerns regarding medicines are outlined above.  Orders Placed This Encounter  Procedures  . EKG 12-Lead   No orders of the defined types were placed in this encounter.   There are no Patient Instructions on file for this visit.   Signed, Sinclair Grooms, MD  01/03/2021 11:16 AM    [place]

## 2021-01-03 ENCOUNTER — Encounter: Payer: Self-pay | Admitting: Interventional Cardiology

## 2021-01-03 ENCOUNTER — Other Ambulatory Visit: Payer: Self-pay

## 2021-01-03 ENCOUNTER — Ambulatory Visit: Payer: PPO | Admitting: Interventional Cardiology

## 2021-01-03 VITALS — BP 122/66 | HR 81 | Ht 65.0 in | Wt 189.8 lb

## 2021-01-03 DIAGNOSIS — R0683 Snoring: Secondary | ICD-10-CM

## 2021-01-03 DIAGNOSIS — E7800 Pure hypercholesterolemia, unspecified: Secondary | ICD-10-CM

## 2021-01-03 DIAGNOSIS — Z952 Presence of prosthetic heart valve: Secondary | ICD-10-CM | POA: Diagnosis not present

## 2021-01-03 DIAGNOSIS — I10 Essential (primary) hypertension: Secondary | ICD-10-CM

## 2021-01-03 DIAGNOSIS — Z7189 Other specified counseling: Secondary | ICD-10-CM | POA: Diagnosis not present

## 2021-01-03 DIAGNOSIS — Z794 Long term (current) use of insulin: Secondary | ICD-10-CM | POA: Diagnosis not present

## 2021-01-03 DIAGNOSIS — N183 Chronic kidney disease, stage 3 unspecified: Secondary | ICD-10-CM

## 2021-01-03 DIAGNOSIS — I25810 Atherosclerosis of coronary artery bypass graft(s) without angina pectoris: Secondary | ICD-10-CM | POA: Diagnosis not present

## 2021-01-03 DIAGNOSIS — I5022 Chronic systolic (congestive) heart failure: Secondary | ICD-10-CM

## 2021-01-03 DIAGNOSIS — E1159 Type 2 diabetes mellitus with other circulatory complications: Secondary | ICD-10-CM | POA: Diagnosis not present

## 2021-01-03 NOTE — Patient Instructions (Signed)
Medication Instructions:  Your physician recommends that you continue on your current medications as directed. Please refer to the Current Medication list given to you today.  *If you need a refill on your cardiac medications before your next appointment, please call your pharmacy*   Lab Work: None If you have labs (blood work) drawn today and your tests are completely normal, you will receive your results only by: Marland Kitchen MyChart Message (if you have MyChart) OR . A paper copy in the mail If you have any lab test that is abnormal or we need to change your treatment, we will call you to review the results.   Testing/Procedures: Your physician recommends that you have an Amyloid scan performed.    Follow-Up: At [facility], you and your health needs are our priority.  As part of our continuing mission to provide you with exceptional heart care, we have created designated Provider Care Teams.  These Care Teams include your primary Cardiologist (physician) and Advanced Practice Providers (APPs -  Physician Assistants and Nurse Practitioners) who all work together to provide you with the care you need, when you need it.  We recommend signing up for the patient portal called "MyChart".  Sign up information is provided on this After Visit Summary.  MyChart is used to connect with patients for Virtual Visits (Telemedicine).  Patients are able to view lab/test results, encounter notes, upcoming appointments, etc.  Non-urgent messages can be sent to your provider as well.   To learn more about what you can do with MyChart, go to [email or web address].    Your next appointment:   6-8 month(s)  The format for your next appointment:   In Person  Provider:   You may see Sinclair Grooms, MD or one of the following Advanced Practice Providers on your designated Care Team:    Cecilie Kicks, NP  Kathyrn Drown, NP    Other Instructions

## 2021-01-08 ENCOUNTER — Telehealth (HOSPITAL_COMMUNITY): Payer: Self-pay | Admitting: *Deleted

## 2021-01-08 DIAGNOSIS — Z8546 Personal history of malignant neoplasm of prostate: Secondary | ICD-10-CM | POA: Diagnosis not present

## 2021-01-08 DIAGNOSIS — N401 Enlarged prostate with lower urinary tract symptoms: Secondary | ICD-10-CM | POA: Diagnosis not present

## 2021-01-08 DIAGNOSIS — R3915 Urgency of urination: Secondary | ICD-10-CM | POA: Diagnosis not present

## 2021-01-08 DIAGNOSIS — Z85528 Personal history of other malignant neoplasm of kidney: Secondary | ICD-10-CM | POA: Diagnosis not present

## 2021-01-08 NOTE — Telephone Encounter (Signed)
Patient given detailed instructions per Amyloid Perfusion Study Information Sheet for the test on 01/15/21 at 1100. Patient notified to arrive 15 minutes early and that it is imperative to arrive on time for appointment to keep from having the test rescheduled.  If you need to cancel or reschedule your appointment, please call the office within 24 hours of your appointment. . Patient verbalized understanding.[name], Ranae Palms

## 2021-01-13 NOTE — Progress Notes (Signed)
[facility]    Provider:  Dr Jaynee Eagles Requesting Provider: Lajean Manes, MD Primary Care Provider:  Lajean Manes, MD  CC:  Evaluation for EMG/NCS  HPI:  [name] is a 87 y.o. male here as requested by Lajean Manes, MD for diabetic neuropathy.  Past medical history of spinal stenosis, polymyalgia, osteoporosis, obesity, low back pain and joint pain, hypertension, hyperlipidemia, history of renal cell cancer, gout, glaucoma, GERD, coronary artery disease, chronic systolic heart failure, chondromalacia, myocardial infarction, chronic kidney disease, polyneuropathy.   Patient is here with his wife and reports that he has had numbness in his feet since his back surgery a few years ago.  He has new numbness in his hands which wakes him up at night with painful hands and the skin on his fingertips feels sore and numb but they are dry.  Today he also has some new issues he states 12 weeks ago he woke up and felt terrible, he was so dizzy and unstable for 2 to 3 days, resolved. He has a left hand knuckle stinging digit 3, 10-11 weeks ago digits 1-3 went numb on both hands, otherwise he wakes up at ngiht and palm is aching with pain. Painful to open jars, numbness and tingling and very bad arthritis 'everywhere" He had diskiitis in the lumbar spine and since then he has numbness in his feet. Not progressive. His feet are numb from the ankles down. His feet will feel cold and they are not. His arms will hurt in the middle of the night. He has not tried wearing wrist splints, he tries to keep his arms straight, doen't affect him during the day. It can be painful at night, massaging it helps. Wil llast about a minute but can be moderately painful. He is on B12 supplements. No other focal neurologic deficits, associated symptoms, inciting events or modifiable factors.    I reviewed Dr. Carlyle Lipa notes, patient feels that his hands and feet are stinging, burning, pain is getting worse, he  was seen in September for this, he reported in October that his hands were no numb, cannot hold anything, shoulders hips and knees aching, Dr. Felipa Eth diagnosed him with diabetic neuropathy, treatment is symptomatic along with diabetic control, they started gabapentin to help with the pain, they also wanted nerve conduction studies of the arms to see if carpal tunnel may be playing a role.  Reviewed notes, labs and imaging from outside physicians, which showed:  MRI lumbar spine 12/18/2016:  FINDINGS: Segmentation:  5 lumbar type vertebral bodies  Alignment: Curvature convex to the left with the apex at L3. 2 mm anterolisthesis L5-S1.  Vertebrae: No primary bone lesion. Chronic fatty change of the L4 and L5 vertebral bodies. Chronic loss of height at L5 left more than right.  Conus medullaris: Extends to the L1 level and appears normal.  Paraspinal and other soft tissues: Negative  Disc levels:  T10-11, T11-12 and T12-L1:  Normal.  L1-2: Disc degeneration with endplate osteophytes and bulging of the disc. No significant stenosis.  L2-3: Disc degeneration with endplate osteophytes and bulging of the disc. No significant stenosis.  L3-4: Previous posterior decompression. Chronic disc degeneration with loss of height. Endplate osteophytes and minimal bulging of disc material. No canal stenosis. Mild foraminal narrowing bilaterally.  L4-5: Previous posterior decompression. Chronic disc degeneration with complete loss of disc height. Endplate osteophytes. No central canal stenosis. Chronic foraminal narrowing left more than right.  L5-S1: Bilateral facet arthropathy left worse than right with 2 mm  of anterolisthesis. Bulging of the disc. Mild stenosis of the subarticular lateral recess on the left which could be symptomatic.  IMPRESSION: Curvature convex to the left with the apex at L2-3. Chronic changes at L4-5 related to treated discitis osteomyelitis. No  evidence of residual or recurrent infection.  L3-4: Previous posterior decompression. No central canal stenosis. Mild foraminal narrowing bilaterally without visible neural compression.  L4-5: Previous posterior decompression. No central canal stenosis. Mild foraminal narrowing bilaterally without visible neural compression.  L5-S1: Facet arthropathy worse on the left. Bulging of the disc. 2 mm of anterolisthesis. Subarticular lateral recess narrowing on the left which could possibly affect the left S1 nerve root.   BMP February 12, 2020 showed elevated glucose 217, BUN 21 and creatinine 1.72.  Multiple myeloma pattern results immunofixation appears unremarkable no evidence of monoclonal protein,  Review of Systems: Patient complains of symptoms per HPI as well as the following symptoms: swelling. Pertinent negatives and positives per HPI. All others negative.   Social History   Socioeconomic History  . Marital status: Married    Spouse name: Not on file  . Number of children: Not on file  . Years of education: Not on file  . Highest education level: Not on file  Occupational History  . Not on file  Tobacco Use  . Smoking status: Former Smoker    Packs/day: 3.00    Years: 30.00    Pack years: 90.00    Types: Cigarettes    Quit date: 12/21/1980    Years since quitting: 40.0  . Smokeless tobacco: Never Used  Vaping Use  . Vaping Use: Never used  Substance and Sexual Activity  . Alcohol use: No  . Drug use: No  . Sexual activity: Not on file  Other Topics Concern  . Not on file  Social History Narrative   Lives at home with wife   Right handed   Caffeine: 1 cup/day, occassionally has a 2nd    Social Determinants of Radio broadcast assistant Strain: Not on file  Food Insecurity: Not on file  Transportation Needs: Not on file  Physical Activity: Not on file  Stress: Not on file  Social Connections: Not on file  Intimate Partner Violence: Not on file     Family History  Problem Relation Age of Onset  . Heart Problems Mother   . Heart Problems Father   . Neuropathy Neg Hx     Past Medical History:  Diagnosis Date  . Chondromalacia   . Chronic systolic CHF (congestive heart failure) ([place])   . Coronary artery disease    a. 2007: s/p CABG x3V (LIMA--> LAD, SVG--> PDA, SVG--> ramus)  c. 2017: LHC with 2/3 patent bypass grafts with occuluded SVG--> Ramus   . Diabetes mellitus ([place])   . GERD (gastroesophageal reflux disease)   . Glaucoma    "high normal"  . Gout   . H/O blood clots    R Lower leg, right upper leg  . History of renal cell cancer   . Hyperlipidemia   . Hypertension   . Infection 2016   spread to blood after back surgery  . Joint pain   . Low back pain    "severe"  . Obesity   . Osteoporosis   . Polymyalgia ([place])    h/o  . Prostate cancer ([place])   . Severe aortic stenosis    a. 06/2017: s/p TAVR  . Spinal stenosis   . Thrombophlebitis   . Tubular adenoma  Patient Active Problem List   Diagnosis Date Noted  . History of renal cell cancer   . Diabetes mellitus ([place])   . Prostate cancer ([place])   . Coronary artery disease   . Chronic systolic CHF (congestive heart failure) ([place])   . 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 Acute on chronic systolic heart failure ([place]) 11/02/2016  . Discitis 12/22/2015  . 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 CKD (chronic kidney disease), stage III ([place]) 10/26/2015  . Essential hypertension 02/11/2015  . Hyperlipidemia   . Myocardial infarction   . GERD (gastroesophageal reflux disease)   . H/O blood clots   . Coronary artery disease involving coronary bypass graft of native heart without angina pectoris   . Low back pain   . Polymyalgia ([place])   . Gout   . Glaucoma     Past Surgical History:  Procedure Laterality Date  . APPENDECTOMY  60 years ago  . BACK SURGERY    . CARDIAC CATHETERIZATION    . CARDIAC CATHETERIZATION N/A 11/03/2016    Procedure: Right/Left Heart Cath and Coronary/Graft Angiography;  Surgeon: Belva Crome, MD;  Location: [place] [facility];  Service: Cardiovascular;  Laterality: N/A;  . CORONARY ARTERY BYPASS GRAFT  2007  . EYE SURGERY Right    /w IOL, post cataracts   . PROSTATE SURGERY     seed implant  . ROBOTIC ASSITED PARTIAL NEPHRECTOMY Left 08/31/2013   Procedure: ROBOTIC ASSITED PARTIAL NEPHRECTOMY;  Surgeon: Dutch Gray, MD;  Location: [facility];  Service: Urology;  Laterality: Left;  . TEE WITHOUT CARDIOVERSION N/A 06/22/2017   Procedure: TRANSESOPHAGEAL ECHOCARDIOGRAM (TEE);  Surgeon: Sherren Mocha, MD;  Location: [facility];  Service: Open Heart Surgery;  Laterality: N/A;  . TRANSCATHETER AORTIC VALVE REPLACEMENT, TRANSFEMORAL N/A 06/22/2017   Procedure: TRANSCATHETER AORTIC VALVE REPLACEMENT, TRANSFEMORAL;  Surgeon: Sherren Mocha, MD;  Location: [place] [facility];  Service: Open Heart Surgery;  Laterality: N/A;  . VEIN LIGATION AND STRIPPING Right 1972   right lower leg    Current Outpatient Medications  Medication Sig Dispense Refill  . allopurinol (ZYLOPRIM) 300 MG tablet Take 300 mg by mouth daily.  2  . aspirin 81 MG chewable tablet Chew 81 mg by mouth every evening.     . Calcium Carbonate-Vitamin D 600-200 MG-UNIT TABS Take 1 tablet by mouth daily.    . Cyanocobalamin (VITAMIN B12) 1000 MCG TBCR Take 1,000 mcg by mouth 3 (three) times a week.    . ferrous sulfate 325 (65 FE) MG EC tablet Take 325 mg by mouth once a week.    . gabapentin (NEURONTIN) 100 MG capsule Take 100 mg by mouth 3 (three) times daily.    Marland Kitchen glipiZIDE (GLUCOTROL) 10 MG tablet Take 1 tablet by mouth every morning and 1/2 tablet by mouth every evening    . HYDROcodone-acetaminophen (NORCO) 10-325 MG tablet Take 1-2 tablets by mouth every 6 (six) hours as needed (pain).    . indomethacin (INDOCIN) 25 MG capsule Take 25 mg by mouth daily as needed.    . Multiple Vitamin (MULTIVITAMIN) tablet Take 1 tablet by mouth daily.    . predniSONE  (DELTASONE) 5 MG tablet Take 5 mg by mouth daily with breakfast.     . rosuvastatin (CRESTOR) 20 MG tablet TAKE 1 TABLET BY MOUTH DAILY 90 tablet 3  . tamsulosin (FLOMAX) 0.4 MG CAPS capsule Take 0.4 mg by mouth daily after supper.    . torsemide (DEMADEX) 20 MG tablet Take 1  tablet (20 mg total) by mouth every Monday, Wednesday, and Friday. 45 tablet 1   No current facility-administered medications for this visit.    Allergies as of 01/14/2021 - Review Complete 01/14/2021  Allergen Reaction Noted  . Fish allergy Swelling 06/17/2017  . Lipitor [atorvastatin] Other (See Comments) 08/31/2013  . Penicillins Swelling 01/02/2013  . Simvastatin Other (See Comments) 06/25/2014  . Adacel [tetanus-diphth-acell pertussis]  06/25/2014  . Lasix [furosemide] Swelling 02/24/2017  . Septra [sulfamethoxazole-trimethoprim]  06/25/2014  . Flexeril [cyclobenzaprine] Other (See Comments) 12/24/2015  . Pantoprazole Diarrhea 10/14/2016    Vitals: BP 122/72 (BP Location: Right Arm, Patient Position: Sitting)   Pulse 81   Ht 5' 5.75" (1.67 m)   Wt 189 lb (85.7 kg)   SpO2 95%   BMI 30.74 kg/m  Last Weight:  Wt Readings from Last 1 Encounters:  01/14/21 189 lb (85.7 kg)   Last Height:   Ht Readings from Last 1 Encounters:  01/14/21 5' 5.75" (1.67 m)     Physical exam: Exam: Gen: NAD, conversant, well nourised, obese, well groomed                     CV: RRR, no MRG. No Carotid Bruits. No peripheral edema, warm, nontender Eyes: Conjunctivae clear without exudates or hemorrhage  Neuro: Detailed Neurologic Exam  Speech:    Speech is normal; fluent and spontaneous with normal comprehension.  Cognition:    The patient is oriented to person, place, and time;     recent and remote memory intact;     language fluent;     normal attention, concentration,     fund of knowledge Cranial Nerves:    The pupils are equal, round, and reactive to light. Pupils too small to visualize fundi. . Visual  fields are full to finger confrontation. Extraocular movements are intact. Trigeminal sensation is intact and the muscles of mastication are normal. The face is symmetric. The palate elevates in the midline. Hearing intact. Voice is normal. Shoulder shrug is normal. The tongue has normal motion without fasciculations.   Coordination:    No dysmetria or ataxia  Gait:    Normal, not ataxic, good stride and swing  Motor Observation:    No asymmetry, no atrophy, and no involuntary movements noted. Tone:    Normal muscle tone.    Posture:    Posture is normal. normal erect    Strength:weak grip,  right prox arm weakness (he has arthritic pain in the shoulder) otherwise strength is V/V in the upper and lower limbs.      Sensation: intact to LT      Reflex Exam:  DTR's:    Absent AJs, trace patellars, 1+ biceps    Toes:    The toes are downgoing bilaterally.   Clonus:    Clonus is absent.    Assessment/Plan:  a 87 y.o. male here as requested by Lajean Manes, MD for diabetic neuropathy evaluation for emg/ncs to see if he has CTS or other concomitant etiology.  Past medical history of spinal stenosis, polymyalgia, osteoporosis, obesity, low back pain and joint pain, hypertension, hyperlipidemia, history of renal cell cancer, gout, glaucoma, GERD, coronary artery disease, chronic systolic heart failure, chondromalacia, myocardial infarction, chronic kidney disease, polyneuropathy.   4-5 episodes nocturnal pain in the hands: May be concomitant CTS in the hands and/or arthritis (he says he has severe arthritis "everywhere"). EMG/NCS bilateral upper extremities.   stenosing tenosynovitis?: He has pain in the palm where there  appears to be tendon nodule left hand, will ask Dr. Felipa Eth to take a look.  He had diskiitis in the lumbar spine after surgery years ago and since then he has numbness in his feet. Not progressive, stable. Also has diabetes. He is on B12 supplementation.Dr. Felipa Eth  to follow.   Orders Placed This Encounter  Procedures  . NCV with EMG(electromyography)     Cc: Lajean Manes, MD,  Lajean Manes, MD  Sarina Ill, MD  [facility] [address] [place], [place] [address]  Phone [phone] Fax [phone]

## 2021-01-14 ENCOUNTER — Ambulatory Visit: Payer: PPO | Admitting: Neurology

## 2021-01-14 ENCOUNTER — Other Ambulatory Visit: Payer: Self-pay

## 2021-01-14 ENCOUNTER — Ambulatory Visit (INDEPENDENT_AMBULATORY_CARE_PROVIDER_SITE_OTHER): Payer: PPO | Admitting: Neurology

## 2021-01-14 ENCOUNTER — Encounter: Payer: Self-pay | Admitting: Neurology

## 2021-01-14 VITALS — BP 122/72 | HR 81 | Ht 65.75 in | Wt 189.0 lb

## 2021-01-14 DIAGNOSIS — Z0289 Encounter for other administrative examinations: Secondary | ICD-10-CM

## 2021-01-14 DIAGNOSIS — M79642 Pain in left hand: Secondary | ICD-10-CM

## 2021-01-14 DIAGNOSIS — R202 Paresthesia of skin: Secondary | ICD-10-CM

## 2021-01-14 DIAGNOSIS — M79641 Pain in right hand: Secondary | ICD-10-CM

## 2021-01-14 DIAGNOSIS — R200 Anesthesia of skin: Secondary | ICD-10-CM

## 2021-01-14 DIAGNOSIS — G5603 Carpal tunnel syndrome, bilateral upper limbs: Secondary | ICD-10-CM

## 2021-01-14 NOTE — Patient Instructions (Signed)
Electromyoneurogram Electromyoneurogram is a test to check how well your muscles and nerves are working. This procedure includes the combined use of electromyogram (EMG) and nerve conduction study (NCS). EMG is used to look for muscular disorders. NCS, which is also called electroneurogram, measures how well your nerves are controlling your muscles. The procedures are usually done together to check if your muscles and nerves are healthy. If the results of the tests are abnormal, this may indicate disease or injury, such as a neuromuscular disease or peripheral nerve damage. Tell a health care provider about:  Any allergies you have.  All medicines you are taking, including vitamins, herbs, eye drops, creams, and over-the-counter medicines.  Any problems you or family members have had with anesthetic medicines.  Any blood disorders you have.  Any surgeries you have had.  Any medical conditions you have.  If you have a pacemaker.  Whether you are pregnant or may be pregnant. What are the risks? Generally, this is a safe procedure. However, problems may occur, including:  Infection where the electrodes were inserted.  Bleeding. What happens before the procedure? Medicines Ask your health care provider about:  Changing or stopping your regular medicines. This is especially important if you are taking diabetes medicines or blood thinners.  Taking medicines such as aspirin and ibuprofen. These medicines can thin your blood. Do not take these medicines unless your health care provider tells you to take them.  Taking over-the-counter medicines, vitamins, herbs, and supplements. General instructions  Your health care provider may ask you to avoid: ? Beverages that have caffeine, such as coffee and tea. ? Any products that contain nicotine or tobacco. These products include cigarettes, e-cigarettes, and chewing tobacco. If you need help quitting, ask your health care provider.  Do not  use lotions or creams on the same day that you will be having the procedure. What happens during the procedure? For EMG  Your health care provider will ask you to stay in a position so that he or she can access the muscle that will be studied. You may be standing, sitting, or lying down.  You may be given a medicine that numbs the area (local anesthetic).  A very thin needle that has an electrode will be inserted into your muscle.  Another small electrode will be placed on your skin near the muscle.  Your health care provider will ask you to continue to remain still.  The electrodes will send a signal that tells about the electrical activity of your muscles. You may see this on a monitor or hear it in the room.  After your muscles have been studied at rest, your health care provider will ask you to contract or flex your muscles. The electrodes will send a signal that tells about the electrical activity of your muscles.  Your health care provider will remove the electrodes and the electrode needles when the procedure is finished. The procedure may vary among health care providers and hospitals.   For NCS  An electrode that records your nerve activity (recording electrode) will be placed on your skin by the muscle that is being studied.  An electrode that is used as a reference (reference electrode) will be placed near the recording electrode.  A paste or gel will be applied to your skin between the recording electrode and the reference electrode.  Your nerve will be stimulated with a mild shock. Your health care provider will measure how much time it takes for your muscle to react.  Your health care provider will remove the electrodes and the gel when the procedure is finished. The procedure may vary among health care providers and hospitals.   What happens after the procedure?  It is up to you to get the results of your procedure. Ask your health care provider, or the department  that is doing the procedure, when your results will be ready.  Your health care provider may: ? Give you medicines for any pain. ? Monitor the insertion sites to make sure that bleeding stops. Summary  Electromyoneurogram is a test to check how well your muscles and nerves are working.  If the results of the tests are abnormal, this may indicate disease or injury.  This is a safe procedure. However, problems may occur, such as bleeding and infection.  Your health care provider will do two tests to complete this procedure. One checks your muscles (EMG) and another checks your nerves (NCS).  It is up to you to get the results of your procedure. Ask your health care provider, or the department that is doing the procedure, when your results will be ready. This information is not intended to replace advice given to you by your health care provider. Make sure you discuss any questions you have with your health care provider. Document Revised: 08/23/2018 Document Reviewed: 08/05/2018 Elsevier Patient Education  2021 [place] Syndrome  Carpal tunnel syndrome is a condition that causes pain, weakness, and numbness in your hand and arm. Numbness is when you cannot feel an area in your body. The carpal tunnel is a narrow area that is on the palm side of your wrist. Repeated wrist motion or certain diseases may cause swelling in the tunnel. This swelling can pinch the main nerve in the wrist. This nerve is called the median nerve. What are the causes? This condition may be caused by:  Moving your hand and wrist over and over again while doing a task.  Injury to the wrist.  Arthritis.  A sac of fluid (cyst) or abnormal growth (tumor) in the carpal tunnel.  Fluid buildup during pregnancy.  Use of tools that vibrate. Sometimes the cause is not known. What increases the risk? The following factors may make you more likely to have this condition:  Having a job that  makes you do these things: ? Move your hand over and over again. ? Work with tools that vibrate, such as drills or sanders.  Being a woman.  Having diabetes, obesity, thyroid problems, or kidney failure. What are the signs or symptoms? Symptoms of this condition include:  A tingling feeling in your fingers.  Tingling or loss of feeling in your hand.  Pain in your entire arm. This pain may get worse when you bend your wrist and elbow for a long time.  Pain in your wrist that goes up your arm to your shoulder.  Pain that goes down into your palm or fingers.  Weakness in your hands. You may find it hard to grab and hold items. You may feel worse at night. How is this treated? This condition may be treated with:  Lifestyle changes. You will be asked to stop or change the activity that caused your problem.  Doing exercises and activities that make bones, muscles, and tendons stronger (physical therapy).  Learning how to use your hand again (occupational therapy).  Medicines for pain and swelling. You may have injections in your wrist.  A wrist splint or brace.  Surgery. Follow  these instructions at home: If you have a splint or brace:  Wear the splint or brace as told by your doctor. Take it off only as told by your doctor.  Loosen the splint if your fingers: ? Tingle. ? Become numb. ? Turn cold and blue.  Keep the splint or brace clean.  If the splint or brace is not waterproof: ? Do not let it get wet. ? Cover it with a watertight covering when you take a bath or a shower. Managing pain, stiffness, and swelling If told, put ice on the painful area:  If you have a removable splint or brace, remove it as told by your doctor.  Put ice in a plastic bag.  Place a towel between your skin and the bag.  Leave the ice on for 20 minutes, 2-3 times per day. Do not fall asleep with the cold pack on your skin.  Take off the ice if your skin turns bright red. This is very  important. If you cannot feel pain, heat, or cold, you have a greater risk of damage to the area. Move your fingers often to reduce stiffness and swelling.   General instructions  Take over-the-counter and prescription medicines only as told by your doctor.  Rest your wrist from any activity that may cause pain. If needed, talk with your boss at work about changes that can help your wrist heal.  Do exercises as told by your doctor, physical therapist, or occupational therapist.  Keep all follow-up visits. Contact a doctor if:  You have new symptoms.  Medicine does not help your pain.  Your symptoms get worse. Get help right away if:  You have very bad numbness or tingling in your wrist or hand. Summary  Carpal tunnel syndrome is a condition that causes pain in your hand and arm.  It is often caused by repeated wrist motions.  Lifestyle changes and medicines are used to treat this problem. Surgery may help in very bad cases.  Follow your doctor's instructions about wearing a splint, resting your wrist, keeping follow-up visits, and calling for help. This information is not intended to replace advice given to you by your health care provider. Make sure you discuss any questions you have with your health care provider. Document Revised: 04/18/2020 Document Reviewed: 04/18/2020 Elsevier Patient Education  [name].

## 2021-01-14 NOTE — Progress Notes (Signed)
See procedure note.

## 2021-01-15 ENCOUNTER — Encounter (HOSPITAL_COMMUNITY): Payer: PPO

## 2021-01-15 NOTE — Progress Notes (Signed)
Full Name: [name] Gender: Male MRN #: [id number] Date of Birth: [birth year]    Visit Date: 01/14/2021 13:56 Age: 87 Years Examining Physician: Sarina Ill, MD  Referring Physician: Lajean Manes, MD  History: Bilateral finger numbness and tingling  Summary: Nerve Conduction Studies were performed on the bilateral upper extremities.  The right median APB motor nerve showed prolonged distal onset latency (5.7 ms, N<4.4) and reduced amplitude(2.5 mV, N>4). The left  median APB motor nerve showed prolonged distal onset latency (6.8 ms, N<4.4) and reduced amplitude(3.6 mV, N>4). The left Median 2nd Digit orthodromic sensory nerve showed prolonged distal peak latency (3.4 ms, N<3.4) and reduced amplitude(6 uV, N>10). The right Median 2nd Digit orthodromic sensory nerve showed no response. The left Radial sensory nerve showed delayed distal peak latency(5uV,N>15). The left Ulnar orthodromic sensory nerve showed delayed distal peak latency(3uV,N>5).  The right median/ulnar (palm) comparison nerve showed prolonged distal peak latency (Median Palm, 5.8 ms, N<2.2) and abnormal peak latency difference (Median Palm-Ulnar Palm, 0.9 ms, N<0.4) with a relative median delay.  The left median/ulnar (palm) comparison nerve showed prolonged distal peak latency (Median Palm, 5.2 ms, N<2.2) and abnormal peak latency difference (Median Palm-Ulnar Palm, 0.6 ms, N<0.4) with a relative median delay.    All remaining nerves (as indicated in the following tables) were within normal limits.  All muscles (as indicated in the following tables) were within normal limits.      Conclusion: This is an abnormal study. There is electrophysiologic evidence of bilateral moderately-severe Carpal Tunnel Syndrome.  There is concomitant polyneuropathy. No suggestion of acute/ongoing radiculopathy.    ------------------------------- Sarina Ill, M.D.  [facility] [address], [place], [place] [address] Tel: [phone] Fax: [phone]  Verbal informed consent was obtained from the patient, patient was informed of potential risk of procedure, including bruising, bleeding, hematoma formation, infection, muscle weakness, muscle pain, numbness, among others.        [place]    Nerve / Sites Muscle Latency Ref. Amplitude Ref. Rel Amp Segments Distance Velocity Ref. Area    ms ms mV mV %  cm m/s m/s mVms  R Median - APB     Wrist APB 5.7 ?4.4 2.5 ?4.0 100 Wrist - APB 7   9.0     Upper arm APB 10.8  2.2  88.7 Upper arm - Wrist 22 43 ?49 11.7  L Median - APB     Wrist APB 6.8 ?4.4 3.6 ?4.0 100 Wrist - APB 7   12.8     Upper arm APB 12.0  3.2  89.3 Upper arm - Wrist 21 40 ?49 12.0  R Ulnar - ADM     Wrist ADM 3.2 ?3.3 8.3 ?6.0 100 Wrist - ADM 7   17.4     B.Elbow ADM 7.3  5.4  65.8 B.Elbow - Wrist 20 49 ?49 12.2     A.Elbow ADM 9.3  4.5  82.4 A.Elbow - B.Elbow 10 49 ?49 11.4  L Ulnar - ADM     Wrist ADM 3.1 ?3.3 6.3 ?6.0 100 Wrist - ADM 7   17.6     B.Elbow ADM 7.3  5.3  84.3 B.Elbow - Wrist 21 51 ?49 16.0     A.Elbow ADM 9.3  5.7  108 A.Elbow - B.Elbow 10 49 ?49 20.1             SNC    Nerve / Sites Rec. Site Peak Lat Ref.  Amp Ref. Segments Distance Peak Diff Ref.    ms ms V V  cm ms ms  L Radial - Anatomical snuff box (Forearm)     Forearm Wrist 2.2 ?2.9 5 ?15 Forearm - Wrist 10    R Median, Ulnar - Transcarpal comparison     Median Palm Wrist 5.8 ?2.2 8 ?35 Median Palm - Wrist 8       Ulnar Palm Wrist 4.9 ?2.2 7 ?12 Ulnar Palm - Wrist 8          Median Palm - Ulnar Palm  0.9 ?0.4  L Median, Ulnar - Transcarpal comparison     Median Palm Wrist 5.2 ?2.2 5 ?35 Median Palm - Wrist 8       Ulnar Palm Wrist 4.6 ?2.2 9 ?12 Ulnar Palm - Wrist 8          Median Palm - Ulnar Palm  0.6 ?0.4  R Median - Orthodromic (Dig II, Mid palm)     Dig II Wrist NR ?3.4 NR ?10 Dig II - Wrist 13    L Median - Orthodromic (Dig II, Mid palm)     Dig II Wrist 3.4 ?3.4 6 ?10 Dig  II - Wrist 13    R Ulnar - Orthodromic, (Dig V, Mid palm)     Dig V Wrist 2.5 ?3.1 6 ?5 Dig V - Wrist 11    L Ulnar - Orthodromic, (Dig V, Mid palm)     Dig V Wrist 3.1 ?3.1 3 ?5 Dig V - Wrist 33                     F  Wave    Nerve F Lat Ref.   ms ms  R Ulnar - ADM 25.8 ?32.0  L Ulnar - ADM 31.6 ?32.0         EMG Summary Table    Spontaneous MUAP Recruitment  Muscle IA Fib PSW Fasc Other Amp Dur. Poly Pattern  Bilateral Deltoid Normal None None None _______ Normal Normal Normal Normal  Bilateral  Triceps brachii Normal None None None _______ Normal Normal Normal Normal  Bilateral Pronator teres Normal None None None _______ Normal Normal Normal Normal  Bilateral  First dorsal interosseous Normal None None None _______ Normal Normal Normal Normal  Bilateral  Cervical paraspinals (low) Normal None None None _______ Normal Normal Normal Normal  Bilateral  Opponens pollicis Normal None None None _______ Normal Normal Normal Normal

## 2021-01-15 NOTE — Procedures (Signed)
Full Name: [name]" Diekman Gender: Male MRN #: [id number] Date of Birth: May 05, 1934    Visit Date: 01/14/2021 13:56 Age: 87 Years Examining Physician: Sarina Ill, MD  Referring Physician: Lajean Manes, MD  History: Bilateral finger numbness and tingling  Summary: Nerve Conduction Studies were performed on the bilateral upper extremities.  The right median APB motor nerve showed prolonged distal onset latency (5.7 ms, N<4.4) and reduced amplitude(2.5 mV, N>4). The left  median APB motor nerve showed prolonged distal onset latency (6.8 ms, N<4.4) and reduced amplitude(3.6 mV, N>4). The left Median 2nd Digit orthodromic sensory nerve showed prolonged distal peak latency (3.4 ms, N<3.4) and reduced amplitude(6 uV, N>10). The right Median 2nd Digit orthodromic sensory nerve showed no response. The left Radial sensory nerve showed delayed distal peak latency(5uV,N>15). The left Ulnar orthodromic sensory nerve showed delayed distal peak latency(3uV,N>5).  The right median/ulnar (palm) comparison nerve showed prolonged distal peak latency (Median Palm, 5.8 ms, N<2.2) and abnormal peak latency difference (Median Palm-Ulnar Palm, 0.9 ms, N<0.4) with a relative median delay.  The left median/ulnar (palm) comparison nerve showed prolonged distal peak latency (Median Palm, 5.2 ms, N<2.2) and abnormal peak latency difference (Median Palm-Ulnar Palm, 0.6 ms, N<0.4) with a relative median delay.    All remaining nerves (as indicated in the following tables) were within normal limits.  All muscles (as indicated in the following tables) were within normal limits.      Conclusion: This is an abnormal study. There is electrophysiologic evidence of bilateral moderately-severe Carpal Tunnel Syndrome.  There is concomitant polyneuropathy. No suggestion of acute/ongoing radiculopathy.    ------------------------------- Sarina Ill, M.D.  [facility] [address], [place], [place] [address] Tel: [phone] Fax: [phone]  Verbal informed consent was obtained from the patient, patient was informed of potential risk of procedure, including bruising, bleeding, hematoma formation, infection, muscle weakness, muscle pain, numbness, among others.        [place]    Nerve / Sites Muscle Latency Ref. Amplitude Ref. Rel Amp Segments Distance Velocity Ref. Area    ms ms mV mV %  cm m/s m/s mVms  R Median - APB     Wrist APB 5.7 ?4.4 2.5 ?4.0 100 Wrist - APB 7   9.0     Upper arm APB 10.8  2.2  88.7 Upper arm - Wrist 22 43 ?49 11.7  L Median - APB     Wrist APB 6.8 ?4.4 3.6 ?4.0 100 Wrist - APB 7   12.8     Upper arm APB 12.0  3.2  89.3 Upper arm - Wrist 21 40 ?49 12.0  R Ulnar - ADM     Wrist ADM 3.2 ?3.3 8.3 ?6.0 100 Wrist - ADM 7   17.4     B.Elbow ADM 7.3  5.4  65.8 B.Elbow - Wrist 20 49 ?49 12.2     A.Elbow ADM 9.3  4.5  82.4 A.Elbow - B.Elbow 10 49 ?49 11.4  L Ulnar - ADM     Wrist ADM 3.1 ?3.3 6.3 ?6.0 100 Wrist - ADM 7   17.6     B.Elbow ADM 7.3  5.3  84.3 B.Elbow - Wrist 21 51 ?49 16.0     A.Elbow ADM 9.3  5.7  108 A.Elbow - B.Elbow 10 49 ?49 20.1             SNC    Nerve / Sites Rec. Site Peak Lat Ref.  Amp Ref. Segments Distance Peak Diff Ref.    ms ms V V  cm ms ms  L Radial - Anatomical snuff box (Forearm)     Forearm Wrist 2.2 ?2.9 5 ?15 Forearm - Wrist 10    R Median, Ulnar - Transcarpal comparison     Median Palm Wrist 5.8 ?2.2 8 ?35 Median Palm - Wrist 8       Ulnar Palm Wrist 4.9 ?2.2 7 ?12 Ulnar Palm - Wrist 8          Median Palm - Ulnar Palm  0.9 ?0.4  L Median, Ulnar - Transcarpal comparison     Median Palm Wrist 5.2 ?2.2 5 ?35 Median Palm - Wrist 8       Ulnar Palm Wrist 4.6 ?2.2 9 ?12 Ulnar Palm - Wrist 8          Median Palm - Ulnar Palm  0.6 ?0.4  R Median - Orthodromic (Dig II, Mid palm)     Dig II Wrist NR ?3.4 NR ?10 Dig II - Wrist 13    L Median - Orthodromic (Dig II, Mid palm)     Dig II Wrist 3.4 ?3.4 6 ?10 Dig  II - Wrist 13    R Ulnar - Orthodromic, (Dig V, Mid palm)     Dig V Wrist 2.5 ?3.1 6 ?5 Dig V - Wrist 11    L Ulnar - Orthodromic, (Dig V, Mid palm)     Dig V Wrist 3.1 ?3.1 3 ?5 Dig V - Wrist 36                     F  Wave    Nerve F Lat Ref.   ms ms  R Ulnar - ADM 25.8 ?32.0  L Ulnar - ADM 31.6 ?32.0         EMG Summary Table    Spontaneous MUAP Recruitment  Muscle IA Fib PSW Fasc Other Amp Dur. Poly Pattern  Bilateral Deltoid Normal None None None _______ Normal Normal Normal Normal  Bilateral  Triceps brachii Normal None None None _______ Normal Normal Normal Normal  Bilateral Pronator teres Normal None None None _______ Normal Normal Normal Normal  Bilateral  First dorsal interosseous Normal None None None _______ Normal Normal Normal Normal  Bilateral  Cervical paraspinals (low) Normal None None None _______ Normal Normal Normal Normal  Bilateral  Opponens pollicis Normal None None None _______ Normal Normal Normal Normal

## 2021-01-20 ENCOUNTER — Telehealth (HOSPITAL_COMMUNITY): Payer: Self-pay | Admitting: *Deleted

## 2021-01-20 NOTE — Telephone Encounter (Signed)
Appointment reminder given for Amyloid on 01/22/21.  Kirstie Peri

## 2021-01-22 ENCOUNTER — Ambulatory Visit (HOSPITAL_COMMUNITY): Payer: PPO | Attending: Cardiovascular Disease

## 2021-01-22 ENCOUNTER — Other Ambulatory Visit: Payer: Self-pay

## 2021-01-22 DIAGNOSIS — I5022 Chronic systolic (congestive) heart failure: Secondary | ICD-10-CM

## 2021-01-22 MED ORDER — TECHNETIUM TC 99M PYROPHOSPHATE
22.0000 | Freq: Once | INTRAVENOUS | Status: AC
  Administered 2021-01-22: 22 via INTRAVENOUS

## 2021-01-23 ENCOUNTER — Other Ambulatory Visit: Payer: Self-pay | Admitting: *Deleted

## 2021-01-23 DIAGNOSIS — E859 Amyloidosis, unspecified: Secondary | ICD-10-CM

## 2021-01-23 DIAGNOSIS — I5022 Chronic systolic (congestive) heart failure: Secondary | ICD-10-CM

## 2021-01-23 DIAGNOSIS — I25810 Atherosclerosis of coronary artery bypass graft(s) without angina pectoris: Secondary | ICD-10-CM

## 2021-01-27 DIAGNOSIS — H2512 Age-related nuclear cataract, left eye: Secondary | ICD-10-CM | POA: Diagnosis not present

## 2021-01-28 ENCOUNTER — Telehealth: Payer: Self-pay | Admitting: Interventional Cardiology

## 2021-01-28 DIAGNOSIS — M47816 Spondylosis without myelopathy or radiculopathy, lumbar region: Secondary | ICD-10-CM | POA: Diagnosis not present

## 2021-01-28 NOTE — Telephone Encounter (Signed)
Spoke with pt and reviewed recommendations per Dr. Tamala Julian.  Pt appreciative for call.

## 2021-01-28 NOTE — Telephone Encounter (Signed)
Follow Up:     Pt is calling for his Stress test results from 01-22-21 please. He also wants to know what medicine can he take that will help him.

## 2021-02-04 DIAGNOSIS — N1832 Chronic kidney disease, stage 3b: Secondary | ICD-10-CM | POA: Diagnosis not present

## 2021-02-11 DIAGNOSIS — H2181 Floppy iris syndrome: Secondary | ICD-10-CM | POA: Diagnosis not present

## 2021-02-11 DIAGNOSIS — H268 Other specified cataract: Secondary | ICD-10-CM | POA: Diagnosis not present

## 2021-02-11 DIAGNOSIS — H2512 Age-related nuclear cataract, left eye: Secondary | ICD-10-CM | POA: Diagnosis not present

## 2021-02-11 DIAGNOSIS — H25812 Combined forms of age-related cataract, left eye: Secondary | ICD-10-CM | POA: Diagnosis not present

## 2021-02-11 DIAGNOSIS — H2189 Other specified disorders of iris and ciliary body: Secondary | ICD-10-CM | POA: Diagnosis not present

## 2021-02-17 DIAGNOSIS — M48062 Spinal stenosis, lumbar region with neurogenic claudication: Secondary | ICD-10-CM | POA: Diagnosis not present

## 2021-02-21 DIAGNOSIS — G5603 Carpal tunnel syndrome, bilateral upper limbs: Secondary | ICD-10-CM | POA: Diagnosis not present

## 2021-02-21 DIAGNOSIS — G5601 Carpal tunnel syndrome, right upper limb: Secondary | ICD-10-CM | POA: Diagnosis not present

## 2021-03-12 DIAGNOSIS — I700 Atherosclerosis of aorta: Secondary | ICD-10-CM | POA: Diagnosis not present

## 2021-03-12 DIAGNOSIS — E1122 Type 2 diabetes mellitus with diabetic chronic kidney disease: Secondary | ICD-10-CM | POA: Diagnosis not present

## 2021-03-12 DIAGNOSIS — E7800 Pure hypercholesterolemia, unspecified: Secondary | ICD-10-CM | POA: Diagnosis not present

## 2021-03-12 DIAGNOSIS — I5032 Chronic diastolic (congestive) heart failure: Secondary | ICD-10-CM | POA: Diagnosis not present

## 2021-03-12 DIAGNOSIS — E1142 Type 2 diabetes mellitus with diabetic polyneuropathy: Secondary | ICD-10-CM | POA: Diagnosis not present

## 2021-03-12 DIAGNOSIS — N1832 Chronic kidney disease, stage 3b: Secondary | ICD-10-CM | POA: Diagnosis not present

## 2021-03-12 DIAGNOSIS — Z79899 Other long term (current) drug therapy: Secondary | ICD-10-CM | POA: Diagnosis not present

## 2021-03-27 DIAGNOSIS — G5603 Carpal tunnel syndrome, bilateral upper limbs: Secondary | ICD-10-CM | POA: Diagnosis not present

## 2021-03-27 DIAGNOSIS — G5601 Carpal tunnel syndrome, right upper limb: Secondary | ICD-10-CM | POA: Diagnosis not present

## 2021-04-02 DIAGNOSIS — L821 Other seborrheic keratosis: Secondary | ICD-10-CM | POA: Diagnosis not present

## 2021-04-02 DIAGNOSIS — L814 Other melanin hyperpigmentation: Secondary | ICD-10-CM | POA: Diagnosis not present

## 2021-04-02 DIAGNOSIS — L570 Actinic keratosis: Secondary | ICD-10-CM | POA: Diagnosis not present

## 2021-04-02 DIAGNOSIS — L304 Erythema intertrigo: Secondary | ICD-10-CM | POA: Diagnosis not present

## 2021-04-02 DIAGNOSIS — D225 Melanocytic nevi of trunk: Secondary | ICD-10-CM | POA: Diagnosis not present

## 2021-04-02 DIAGNOSIS — D2239 Melanocytic nevi of other parts of face: Secondary | ICD-10-CM | POA: Diagnosis not present

## 2021-04-02 DIAGNOSIS — Z85828 Personal history of other malignant neoplasm of skin: Secondary | ICD-10-CM | POA: Diagnosis not present

## 2021-04-02 DIAGNOSIS — D1801 Hemangioma of skin and subcutaneous tissue: Secondary | ICD-10-CM | POA: Diagnosis not present

## 2021-05-01 DIAGNOSIS — G5602 Carpal tunnel syndrome, left upper limb: Secondary | ICD-10-CM | POA: Diagnosis not present

## 2021-05-13 DIAGNOSIS — G5602 Carpal tunnel syndrome, left upper limb: Secondary | ICD-10-CM | POA: Diagnosis not present

## 2021-05-13 DIAGNOSIS — G5601 Carpal tunnel syndrome, right upper limb: Secondary | ICD-10-CM | POA: Diagnosis not present

## 2021-05-14 DIAGNOSIS — E538 Deficiency of other specified B group vitamins: Secondary | ICD-10-CM | POA: Diagnosis not present

## 2021-05-14 DIAGNOSIS — I5032 Chronic diastolic (congestive) heart failure: Secondary | ICD-10-CM | POA: Diagnosis not present

## 2021-05-14 DIAGNOSIS — I700 Atherosclerosis of aorta: Secondary | ICD-10-CM | POA: Diagnosis not present

## 2021-05-14 DIAGNOSIS — E1142 Type 2 diabetes mellitus with diabetic polyneuropathy: Secondary | ICD-10-CM | POA: Diagnosis not present

## 2021-05-14 DIAGNOSIS — N1832 Chronic kidney disease, stage 3b: Secondary | ICD-10-CM | POA: Diagnosis not present

## 2021-05-14 DIAGNOSIS — D692 Other nonthrombocytopenic purpura: Secondary | ICD-10-CM | POA: Diagnosis not present

## 2021-05-14 DIAGNOSIS — E1122 Type 2 diabetes mellitus with diabetic chronic kidney disease: Secondary | ICD-10-CM | POA: Diagnosis not present

## 2021-06-04 ENCOUNTER — Other Ambulatory Visit: Payer: Self-pay

## 2021-06-04 ENCOUNTER — Ambulatory Visit
Admission: RE | Admit: 2021-06-04 | Discharge: 2021-06-04 | Disposition: A | Discharge status: Home / Self Care | Payer: PPO | Source: Ambulatory Visit | Attending: Geriatric Medicine | Admitting: Geriatric Medicine

## 2021-06-04 DIAGNOSIS — M810 Age-related osteoporosis without current pathological fracture: Secondary | ICD-10-CM

## 2021-06-04 DIAGNOSIS — M85852 Other specified disorders of bone density and structure, left thigh: Secondary | ICD-10-CM | POA: Diagnosis not present

## 2021-06-04 DIAGNOSIS — M85832 Other specified disorders of bone density and structure, left forearm: Secondary | ICD-10-CM | POA: Diagnosis not present

## 2021-06-10 DIAGNOSIS — G5601 Carpal tunnel syndrome, right upper limb: Secondary | ICD-10-CM | POA: Diagnosis not present

## 2021-06-10 DIAGNOSIS — G5602 Carpal tunnel syndrome, left upper limb: Secondary | ICD-10-CM | POA: Diagnosis not present

## 2021-07-08 DIAGNOSIS — M65332 Trigger finger, left middle finger: Secondary | ICD-10-CM | POA: Diagnosis not present

## 2021-07-08 DIAGNOSIS — G5602 Carpal tunnel syndrome, left upper limb: Secondary | ICD-10-CM | POA: Diagnosis not present

## 2021-07-08 DIAGNOSIS — G5601 Carpal tunnel syndrome, right upper limb: Secondary | ICD-10-CM | POA: Diagnosis not present

## 2021-07-16 DIAGNOSIS — H40011 Open angle with borderline findings, low risk, right eye: Secondary | ICD-10-CM | POA: Diagnosis not present

## 2021-07-16 DIAGNOSIS — H35373 Puckering of macula, bilateral: Secondary | ICD-10-CM | POA: Diagnosis not present

## 2021-07-16 DIAGNOSIS — Z961 Presence of intraocular lens: Secondary | ICD-10-CM | POA: Diagnosis not present

## 2021-07-16 DIAGNOSIS — H1812 Bullous keratopathy, left eye: Secondary | ICD-10-CM | POA: Diagnosis not present

## 2021-07-17 ENCOUNTER — Encounter: Payer: Self-pay | Admitting: Interventional Cardiology

## 2021-07-17 ENCOUNTER — Telehealth (HOSPITAL_COMMUNITY): Payer: Self-pay | Admitting: *Deleted

## 2021-07-17 NOTE — Telephone Encounter (Signed)
Close encounter 

## 2021-07-17 NOTE — Telephone Encounter (Signed)
Error

## 2021-07-18 ENCOUNTER — Ambulatory Visit (HOSPITAL_COMMUNITY)
Admission: RE | Admit: 2021-07-18 | Discharge: 2021-07-18 | Disposition: A | Discharge status: Home / Self Care | Payer: PPO | Source: Ambulatory Visit | Attending: Cardiovascular Disease | Admitting: Cardiovascular Disease

## 2021-07-18 ENCOUNTER — Other Ambulatory Visit: Payer: Self-pay

## 2021-07-18 DIAGNOSIS — I25810 Atherosclerosis of coronary artery bypass graft(s) without angina pectoris: Secondary | ICD-10-CM | POA: Diagnosis not present

## 2021-07-18 DIAGNOSIS — E859 Amyloidosis, unspecified: Secondary | ICD-10-CM | POA: Diagnosis not present

## 2021-07-18 DIAGNOSIS — I5022 Chronic systolic (congestive) heart failure: Secondary | ICD-10-CM | POA: Insufficient documentation

## 2021-07-18 MED ORDER — TECHNETIUM TC 99M PYROPHOSPHATE
21.0000 | Freq: Once | INTRAVENOUS | Status: AC
  Administered 2021-07-18: 21 via INTRAVENOUS

## 2021-07-25 NOTE — Progress Notes (Signed)
Cardiology Office Note:    Date:  07/28/2021   ID:  [name], DOB 19-Jan-1934, MRN [id number]  PCP:  Lajean Manes, MD  Cardiologist:  Sinclair Grooms, MD   Referring MD: Lajean Manes, MD   Chief Complaint  Patient presents with   Coronary Artery Disease   Cardiac Valve Problem    TAVR    History of Present Illness:    [name] is a 87 y.o. 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  Multiple complaints.  See below.  Denies angina.  No orthopnea.  Has not had lower extremity swelling.  No episodes of syncope.  I was concerned about the possibility of amyloid.  A repeat technetium pyrophosphate scan did not reveal evidence of amyloid.  Multiple somatic complaints.  Exercising in any great degree related to musculoskeletal complaints and neuropathy.  Past Medical History:  Diagnosis Date   Chondromalacia    Chronic systolic CHF (congestive heart failure) ([place])    Coronary artery disease    a. 2007: s/p CABG x3V (LIMA--> LAD, SVG--> PDA, SVG--> ramus)  c. 2017: LHC with 2/3 patent bypass grafts with occuluded SVG--> Ramus    Diabetes mellitus ([place])    GERD (gastroesophageal reflux disease)    Glaucoma    "high normal"   Gout    H/O blood clots    R Lower leg, right upper leg   History of renal cell cancer    Hyperlipidemia    Hypertension    Infection 2016   spread to blood after back surgery   Joint pain    Low back pain    "severe"   Obesity    Osteoporosis    Polymyalgia ([place])    h/o   Prostate cancer ([place])    Severe aortic stenosis    a. 06/2017: s/p TAVR   Spinal stenosis    Thrombophlebitis    Tubular adenoma     Past Surgical History:  Procedure Laterality Date   APPENDECTOMY  60 years ago   [place] N/A 11/03/2016   Procedure: Right/Left Heart Cath  and Coronary/Graft Angiography;  Surgeon: Belva Crome, MD;  Location: [facility];  Service: Cardiovascular;  Laterality: N/A;   CORONARY ARTERY BYPASS GRAFT  2007   EYE SURGERY Right    /w IOL, post cataracts    PROSTATE SURGERY     seed implant   ROBOTIC ASSITED PARTIAL NEPHRECTOMY Left 08/31/2013   Procedure: ROBOTIC ASSITED PARTIAL NEPHRECTOMY;  Surgeon: Dutch Gray, MD;  [facility];  Service: Urology;  Laterality: Left;   TEE WITHOUT CARDIOVERSION N/A 06/22/2017   Procedure: TRANSESOPHAGEAL ECHOCARDIOGRAM (TEE);  Surgeon: Sherren Mocha, MD;  Location: [facility];  Service: Open Heart Surgery;  Laterality: N/A;   TRANSCATHETER AORTIC VALVE REPLACEMENT, TRANSFEMORAL N/A 06/22/2017   Procedure: TRANSCATHETER AORTIC VALVE REPLACEMENT, TRANSFEMORAL;  Surgeon: Sherren Mocha, MD;  Location: [facility];  Service: Open Heart Surgery;  Laterality: N/A;   VEIN LIGATION AND STRIPPING Right 1972   right lower leg    Current Medications: Current Meds  Medication Sig   allopurinol (ZYLOPRIM) 300 MG tablet Take 300 mg by mouth daily.   aspirin 81 MG chewable tablet Chew 81 mg by mouth every evening.    ferrous sulfate 325 (65 FE)  MG EC tablet Take 325 mg by mouth once a week.   glipiZIDE (GLUCOTROL) 10 MG tablet Take 1 tablet by mouth every morning and 1/2 tablet by mouth every evening   HYDROcodone-acetaminophen (NORCO) 10-325 MG tablet Take 1-2 tablets by mouth every 6 (six) hours as needed (pain).   indomethacin (INDOCIN) 25 MG capsule Take 25 mg by mouth daily as needed.   ketoconazole (NIZORAL) 2 % cream SMARTSIG:1 Topical Daily PRN   Multiple Vitamin (MULTIVITAMIN) tablet Take 1 tablet by mouth daily.   predniSONE (DELTASONE) 5 MG tablet Take 5 mg by mouth daily with breakfast.    tamsulosin (FLOMAX) 0.4 MG CAPS capsule Take 0.4 mg by mouth daily after supper.   torsemide (DEMADEX) 20 MG tablet Take 1 tablet (20 mg total) by mouth every Monday, Wednesday, and Friday.     Allergies:    Fish allergy, Lipitor [atorvastatin], Penicillins, Simvastatin, Adacel [tetanus-diphth-acell pertussis], Lasix [furosemide], Septra [sulfamethoxazole-trimethoprim], Flexeril [cyclobenzaprine], and Pantoprazole   Social History   Socioeconomic History   Marital status: Married    Spouse name: Not on file   Number of children: Not on file   Years of education: Not on file   Highest education level: Not on file  Occupational History   Not on file  Tobacco Use   Smoking status: Former    Packs/day: 3.00    Years: 30.00    Pack years: 90.00    Types: Cigarettes    Quit date: 12/21/1980    Years since quitting: 40.6   Smokeless tobacco: Never  Vaping Use   Vaping Use: Never used  Substance and Sexual Activity   Alcohol use: No   Drug use: No   Sexual activity: Not on file  Other Topics Concern   Not on file  Social History Narrative   Lives at home with wife   Right handed   Caffeine: 1 cup/day, occassionally has a 2nd    Social Determinants of Radio broadcast assistant Strain: Not on file  Food Insecurity: Not on file  Transportation Needs: Not on file  Physical Activity: Not on file  Stress: Not on file  Social Connections: Not on file     Family History: The patient's family history includes Heart Problems in his father and mother. There is no history of Neuropathy.  ROS:   Please see the history of present illness.    Neuropathic pain in his feet.  Numbness and tingling in his hands despite having carpal tunnel release surgery in the left hand.  The tips of his fingers are numb.  Urinates frequently at night.  Chronic back pain.  Chronic cough.  Occasional vertigo.  All other systems reviewed and are negative.  EKGs/Labs/Other Studies Reviewed:    The following studies were reviewed today:  Amyloid Scan 07/18/2021: Study Highlights    The study is normal.   1. H/CL ratio 1.1 at 1 hour and 1.2 at 3 hours. Visually this is Grade 0. 2. Overall, negative for TTR  cardiac amyloidosis.  EKG:  EKG is not performed on today's chart  Recent Labs: 11/26/2020: BUN 30; Creatinine, Ser 1.75; Potassium 3.9; Sodium 144  Recent Lipid Panel    Component Value Date/Time   CHOL 151 04/15/2020 1026   TRIG 89 04/15/2020 1026   HDL 55 04/15/2020 1026   CHOLHDL 2.7 04/15/2020 1026   VLDL 18 04/15/2020 1026   LDLCALC 78 04/15/2020 1026    Physical Exam:    VS:  BP 138/64  Pulse 75   Ht 5' 5.75" (1.67 m)   Wt 185 lb 9.6 oz (84.2 kg)   SpO2 97%   BMI 30.18 kg/m     Wt Readings from Last 3 Encounters:  07/28/21 185 lb 9.6 oz (84.2 kg)  01/22/21 189 lb (85.7 kg)  01/14/21 189 lb (85.7 kg)     GEN: Slightly overweight. No acute distress HEENT: Normal NECK: No JVD. LYMPHATICS: No lymphadenopathy CARDIAC: No significant murmur. RRR no gallop, or edema. VASCULAR:  Normal Pulses. No bruits. RESPIRATORY:  Clear to auscultation without rales, wheezing or rhonchi  ABDOMEN: Soft, non-tender, non-distended, No pulsatile mass, MUSCULOSKELETAL: No deformity  SKIN: Warm and dry NEUROLOGIC:  Alert and oriented x 3 PSYCHIATRIC:  Normal affect   ASSESSMENT:    1. Chronic systolic CHF (congestive heart failure) ([place])   2. Coronary artery disease involving coronary bypass graft of native heart without angina pectoris   3. S/P TAVR (transcatheter aortic valve replacement)   4. Stage 3 chronic kidney disease, unspecified whether stage 3a or 3b CKD ([place])   5. Essential hypertension   6. Snoring    PLAN:    In order of problems listed above:  Continue current treatment regimen including torsemide 20 mg/day.  Was unable to tolerate medication therapies in addition because of low blood pressure. Secondary prevention discussed. TAVR valve functioning normally based on auscultation. Creatinine 1.8 in March, and stable. Excellent current blood pressure on diuretic therapy alone. Encouraged to continue CPAP.   Medication Adjustments/Labs and Tests  Ordered: Current medicines are reviewed at length with the patient today.  Concerns regarding medicines are outlined above.  No orders of the defined types were placed in this encounter.  No orders of the defined types were placed in this encounter.   Patient Instructions  Medication Instructions:  Your physician recommends that you continue on your current medications as directed. Please refer to the Current Medication list given to you today.  *If you need a refill on your cardiac medications before your next appointment, please call your pharmacy*   Lab Work: None If you have labs (blood work) drawn today and your tests are completely normal, you will receive your results only by: [place] (if you have MyChart) OR A paper copy in the mail If you have any lab test that is abnormal or we need to change your treatment, we will call you to review the results.   Testing/Procedures: None   Follow-Up: At [facility], you and your health needs are our priority.  As part of our continuing mission to provide you with exceptional heart care, we have created designated Provider Care Teams.  These Care Teams include your primary Cardiologist (physician) and Advanced Practice Providers (APPs -  Physician Assistants and Nurse Practitioners) who all work together to provide you with the care you need, when you need it.  We recommend signing up for the patient portal called "MyChart".  Sign up information is provided on this After Visit Summary.  MyChart is used to connect with patients for Virtual Visits (Telemedicine).  Patients are able to view lab/test results, encounter notes, upcoming appointments, etc.  Non-urgent messages can be sent to your provider as well.   To learn more about what you can do with MyChart, go to [email or web address].    Your next appointment:   1 year(s)  The format for your next appointment:   In Person  Provider:   You may see Sinclair Grooms, MD  or  one of the following Advanced Practice Providers on your designated Care Team:   Cecilie Kicks, NP    Other Instructions     Signed, Sinclair Grooms, MD  07/28/2021 1:53 PM    [place]

## 2021-07-28 ENCOUNTER — Ambulatory Visit: Payer: PPO | Admitting: Interventional Cardiology

## 2021-07-28 ENCOUNTER — Encounter: Payer: Self-pay | Admitting: Interventional Cardiology

## 2021-07-28 ENCOUNTER — Other Ambulatory Visit: Payer: Self-pay

## 2021-07-28 VITALS — BP 138/64 | HR 75 | Ht 65.75 in | Wt 185.6 lb

## 2021-07-28 DIAGNOSIS — I5022 Chronic systolic (congestive) heart failure: Secondary | ICD-10-CM

## 2021-07-28 DIAGNOSIS — I25810 Atherosclerosis of coronary artery bypass graft(s) without angina pectoris: Secondary | ICD-10-CM | POA: Diagnosis not present

## 2021-07-28 DIAGNOSIS — I10 Essential (primary) hypertension: Secondary | ICD-10-CM

## 2021-07-28 DIAGNOSIS — N183 Chronic kidney disease, stage 3 unspecified: Secondary | ICD-10-CM

## 2021-07-28 DIAGNOSIS — R0683 Snoring: Secondary | ICD-10-CM | POA: Diagnosis not present

## 2021-07-28 DIAGNOSIS — Z952 Presence of prosthetic heart valve: Secondary | ICD-10-CM

## 2021-07-28 NOTE — Patient Instructions (Signed)

## 2021-08-05 DIAGNOSIS — G5602 Carpal tunnel syndrome, left upper limb: Secondary | ICD-10-CM | POA: Diagnosis not present

## 2021-08-05 DIAGNOSIS — M65332 Trigger finger, left middle finger: Secondary | ICD-10-CM | POA: Diagnosis not present

## 2021-08-05 DIAGNOSIS — M13842 Other specified arthritis, left hand: Secondary | ICD-10-CM | POA: Diagnosis not present

## 2021-08-13 DIAGNOSIS — I5032 Chronic diastolic (congestive) heart failure: Secondary | ICD-10-CM | POA: Diagnosis not present

## 2021-08-13 DIAGNOSIS — N1832 Chronic kidney disease, stage 3b: Secondary | ICD-10-CM | POA: Diagnosis not present

## 2021-08-13 DIAGNOSIS — E1165 Type 2 diabetes mellitus with hyperglycemia: Secondary | ICD-10-CM | POA: Diagnosis not present

## 2021-08-13 DIAGNOSIS — I5022 Chronic systolic (congestive) heart failure: Secondary | ICD-10-CM | POA: Diagnosis not present

## 2021-08-13 DIAGNOSIS — E7800 Pure hypercholesterolemia, unspecified: Secondary | ICD-10-CM | POA: Diagnosis not present

## 2021-08-13 DIAGNOSIS — E1142 Type 2 diabetes mellitus with diabetic polyneuropathy: Secondary | ICD-10-CM | POA: Diagnosis not present

## 2021-08-13 DIAGNOSIS — Z7984 Long term (current) use of oral hypoglycemic drugs: Secondary | ICD-10-CM | POA: Diagnosis not present

## 2021-09-08 DIAGNOSIS — J301 Allergic rhinitis due to pollen: Secondary | ICD-10-CM | POA: Diagnosis not present

## 2021-09-08 DIAGNOSIS — S51011A Laceration without foreign body of right elbow, initial encounter: Secondary | ICD-10-CM | POA: Diagnosis not present

## 2021-09-08 DIAGNOSIS — Z23 Encounter for immunization: Secondary | ICD-10-CM | POA: Diagnosis not present

## 2021-10-02 DIAGNOSIS — D1801 Hemangioma of skin and subcutaneous tissue: Secondary | ICD-10-CM | POA: Diagnosis not present

## 2021-10-02 DIAGNOSIS — Z85828 Personal history of other malignant neoplasm of skin: Secondary | ICD-10-CM | POA: Diagnosis not present

## 2021-10-02 DIAGNOSIS — L814 Other melanin hyperpigmentation: Secondary | ICD-10-CM | POA: Diagnosis not present

## 2021-10-02 DIAGNOSIS — D485 Neoplasm of uncertain behavior of skin: Secondary | ICD-10-CM | POA: Diagnosis not present

## 2021-10-02 DIAGNOSIS — L821 Other seborrheic keratosis: Secondary | ICD-10-CM | POA: Diagnosis not present

## 2021-10-02 DIAGNOSIS — L570 Actinic keratosis: Secondary | ICD-10-CM | POA: Diagnosis not present

## 2021-10-22 DIAGNOSIS — H40011 Open angle with borderline findings, low risk, right eye: Secondary | ICD-10-CM | POA: Diagnosis not present

## 2021-10-22 DIAGNOSIS — H1812 Bullous keratopathy, left eye: Secondary | ICD-10-CM | POA: Diagnosis not present

## 2021-10-22 DIAGNOSIS — H0102A Squamous blepharitis right eye, upper and lower eyelids: Secondary | ICD-10-CM | POA: Diagnosis not present

## 2021-11-11 DIAGNOSIS — H43813 Vitreous degeneration, bilateral: Secondary | ICD-10-CM | POA: Diagnosis not present

## 2021-11-18 DIAGNOSIS — I340 Nonrheumatic mitral (valve) insufficiency: Secondary | ICD-10-CM | POA: Diagnosis not present

## 2021-11-18 DIAGNOSIS — E1142 Type 2 diabetes mellitus with diabetic polyneuropathy: Secondary | ICD-10-CM | POA: Diagnosis not present

## 2021-11-18 DIAGNOSIS — M353 Polymyalgia rheumatica: Secondary | ICD-10-CM | POA: Diagnosis not present

## 2021-11-18 DIAGNOSIS — Z79899 Other long term (current) drug therapy: Secondary | ICD-10-CM | POA: Diagnosis not present

## 2021-11-18 DIAGNOSIS — I5022 Chronic systolic (congestive) heart failure: Secondary | ICD-10-CM | POA: Diagnosis not present

## 2021-11-18 DIAGNOSIS — M545 Low back pain, unspecified: Secondary | ICD-10-CM | POA: Diagnosis not present

## 2021-11-18 DIAGNOSIS — G8929 Other chronic pain: Secondary | ICD-10-CM | POA: Diagnosis not present

## 2021-11-18 DIAGNOSIS — I700 Atherosclerosis of aorta: Secondary | ICD-10-CM | POA: Diagnosis not present

## 2021-11-18 DIAGNOSIS — D696 Thrombocytopenia, unspecified: Secondary | ICD-10-CM | POA: Diagnosis not present

## 2021-11-18 DIAGNOSIS — E7800 Pure hypercholesterolemia, unspecified: Secondary | ICD-10-CM | POA: Diagnosis not present

## 2021-11-18 DIAGNOSIS — N1832 Chronic kidney disease, stage 3b: Secondary | ICD-10-CM | POA: Diagnosis not present

## 2021-12-08 DIAGNOSIS — Z961 Presence of intraocular lens: Secondary | ICD-10-CM | POA: Diagnosis not present

## 2021-12-08 DIAGNOSIS — H43813 Vitreous degeneration, bilateral: Secondary | ICD-10-CM | POA: Diagnosis not present

## 2021-12-08 DIAGNOSIS — H1820 Unspecified corneal edema: Secondary | ICD-10-CM | POA: Diagnosis not present

## 2022-01-06 DIAGNOSIS — H43813 Vitreous degeneration, bilateral: Secondary | ICD-10-CM | POA: Diagnosis not present

## 2022-02-09 DIAGNOSIS — C61 Malignant neoplasm of prostate: Secondary | ICD-10-CM | POA: Diagnosis not present

## 2022-02-16 DIAGNOSIS — C61 Malignant neoplasm of prostate: Secondary | ICD-10-CM | POA: Diagnosis not present

## 2022-02-23 DIAGNOSIS — I700 Atherosclerosis of aorta: Secondary | ICD-10-CM | POA: Diagnosis not present

## 2022-02-23 DIAGNOSIS — I5022 Chronic systolic (congestive) heart failure: Secondary | ICD-10-CM | POA: Diagnosis not present

## 2022-02-23 DIAGNOSIS — E1122 Type 2 diabetes mellitus with diabetic chronic kidney disease: Secondary | ICD-10-CM | POA: Diagnosis not present

## 2022-02-23 DIAGNOSIS — G8929 Other chronic pain: Secondary | ICD-10-CM | POA: Diagnosis not present

## 2022-02-23 DIAGNOSIS — E1142 Type 2 diabetes mellitus with diabetic polyneuropathy: Secondary | ICD-10-CM | POA: Diagnosis not present

## 2022-02-23 DIAGNOSIS — N1832 Chronic kidney disease, stage 3b: Secondary | ICD-10-CM | POA: Diagnosis not present

## 2022-04-09 DIAGNOSIS — D225 Melanocytic nevi of trunk: Secondary | ICD-10-CM | POA: Diagnosis not present

## 2022-04-09 DIAGNOSIS — L821 Other seborrheic keratosis: Secondary | ICD-10-CM | POA: Diagnosis not present

## 2022-04-09 DIAGNOSIS — L570 Actinic keratosis: Secondary | ICD-10-CM | POA: Diagnosis not present

## 2022-04-09 DIAGNOSIS — L814 Other melanin hyperpigmentation: Secondary | ICD-10-CM | POA: Diagnosis not present

## 2022-04-09 DIAGNOSIS — C44629 Squamous cell carcinoma of skin of left upper limb, including shoulder: Secondary | ICD-10-CM | POA: Diagnosis not present

## 2022-04-09 DIAGNOSIS — D045 Carcinoma in situ of skin of trunk: Secondary | ICD-10-CM | POA: Diagnosis not present

## 2022-04-09 DIAGNOSIS — D1801 Hemangioma of skin and subcutaneous tissue: Secondary | ICD-10-CM | POA: Diagnosis not present

## 2022-04-09 DIAGNOSIS — Z85828 Personal history of other malignant neoplasm of skin: Secondary | ICD-10-CM | POA: Diagnosis not present

## 2022-04-17 DIAGNOSIS — H04123 Dry eye syndrome of bilateral lacrimal glands: Secondary | ICD-10-CM | POA: Diagnosis not present

## 2022-05-26 ENCOUNTER — Other Ambulatory Visit: Payer: Self-pay | Admitting: Interventional Cardiology

## 2022-05-26 DIAGNOSIS — E1122 Type 2 diabetes mellitus with diabetic chronic kidney disease: Secondary | ICD-10-CM | POA: Diagnosis not present

## 2022-05-26 DIAGNOSIS — G894 Chronic pain syndrome: Secondary | ICD-10-CM | POA: Diagnosis not present

## 2022-05-26 DIAGNOSIS — N1832 Chronic kidney disease, stage 3b: Secondary | ICD-10-CM | POA: Diagnosis not present

## 2022-05-26 DIAGNOSIS — E1142 Type 2 diabetes mellitus with diabetic polyneuropathy: Secondary | ICD-10-CM | POA: Diagnosis not present

## 2022-05-26 DIAGNOSIS — M545 Low back pain, unspecified: Secondary | ICD-10-CM | POA: Diagnosis not present

## 2022-05-26 DIAGNOSIS — R310 Gross hematuria: Secondary | ICD-10-CM | POA: Diagnosis not present

## 2022-07-21 DIAGNOSIS — H04123 Dry eye syndrome of bilateral lacrimal glands: Secondary | ICD-10-CM | POA: Diagnosis not present

## 2022-08-06 DIAGNOSIS — M5416 Radiculopathy, lumbar region: Secondary | ICD-10-CM | POA: Insufficient documentation

## 2022-09-02 DIAGNOSIS — F439 Reaction to severe stress, unspecified: Secondary | ICD-10-CM | POA: Diagnosis not present

## 2022-09-02 DIAGNOSIS — E1142 Type 2 diabetes mellitus with diabetic polyneuropathy: Secondary | ICD-10-CM | POA: Diagnosis not present

## 2022-09-02 DIAGNOSIS — Z23 Encounter for immunization: Secondary | ICD-10-CM | POA: Diagnosis not present

## 2022-09-02 DIAGNOSIS — M545 Low back pain, unspecified: Secondary | ICD-10-CM | POA: Diagnosis not present

## 2022-09-08 DIAGNOSIS — G894 Chronic pain syndrome: Secondary | ICD-10-CM | POA: Insufficient documentation

## 2022-09-08 DIAGNOSIS — Z5181 Encounter for therapeutic drug level monitoring: Secondary | ICD-10-CM | POA: Diagnosis not present

## 2022-09-08 DIAGNOSIS — M5416 Radiculopathy, lumbar region: Secondary | ICD-10-CM | POA: Diagnosis not present

## 2022-09-14 DIAGNOSIS — L259 Unspecified contact dermatitis, unspecified cause: Secondary | ICD-10-CM | POA: Diagnosis not present

## 2022-09-21 DIAGNOSIS — M5451 Vertebrogenic low back pain: Secondary | ICD-10-CM | POA: Diagnosis not present

## 2022-09-25 DIAGNOSIS — M5451 Vertebrogenic low back pain: Secondary | ICD-10-CM | POA: Diagnosis not present

## 2022-09-28 DIAGNOSIS — M5451 Vertebrogenic low back pain: Secondary | ICD-10-CM | POA: Diagnosis not present

## 2022-09-30 DIAGNOSIS — M5451 Vertebrogenic low back pain: Secondary | ICD-10-CM | POA: Diagnosis not present

## 2022-10-01 NOTE — Progress Notes (Signed)
Cardiology Office Note:    Date:  10/02/2022   ID:  [name], DOB 06-Mar-1934, MRN [id number]  PCP:  Lajean Manes, MD  Cardiologist:  Sinclair Grooms, MD   Referring MD: Lajean Manes, MD   Chief Complaint  Patient presents with   Coronary Artery Disease    History of Present Illness:    [name] is a 88 y.o. 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  He is here for yearly follow-up.  He is not having any particular complaints.  He started crying midway through the office visit after I informed him I was retiring.  I thought that news precipitated it but after further conversation I find his wife is ill, she has dementia, and he is needing to care for her in a much more demanding way.  He feels under stress.  He was awakened 1 night by prolonged episode of chest discomfort that bothered him off and on for several days.  He developed poison ivy and was given an acids and states that in addition to help in the poison ivy [place] is helped to relieve the chest discomfort.  He does not returned.  His first cardiac instance was with chest discomfort that he interpreted as being indigestion.  Past Medical History:  Diagnosis Date   Chondromalacia    Chronic systolic CHF (congestive heart failure) ([place])    Coronary artery disease    a. 2007: s/p CABG x3V (LIMA--> LAD, SVG--> PDA, SVG--> ramus)  c. 2017: LHC with 2/3 patent bypass grafts with occuluded SVG--> Ramus    Diabetes mellitus ([place])    GERD (gastroesophageal reflux disease)    Glaucoma    "high normal"   Gout    H/O blood clots    R Lower leg, right upper leg   History of renal cell cancer    Hyperlipidemia    Hypertension    Infection 2016   spread to blood after back surgery   Joint pain    Low back pain    "severe"   Obesity    Osteoporosis    Polymyalgia ([place])    h/o    Prostate cancer ([place])    Severe aortic stenosis    a. 06/2017: s/p TAVR   Spinal stenosis    Thrombophlebitis    Tubular adenoma     Past Surgical History:  Procedure Laterality Date   APPENDECTOMY  60 years ago   [place] N/A 11/03/2016   Procedure: Right/Left Heart Cath and Coronary/Graft Angiography;  Surgeon: Belva Crome, MD;  Location: [facility];  Service: Cardiovascular;  Laterality: N/A;   CORONARY ARTERY BYPASS GRAFT  2007   EYE SURGERY Right    /w IOL, post cataracts    PROSTATE SURGERY     seed implant   ROBOTIC ASSITED PARTIAL NEPHRECTOMY Left 08/31/2013   Procedure: ROBOTIC ASSITED PARTIAL NEPHRECTOMY;  Surgeon: Dutch Gray, MD;  [facility];  Service: Urology;  Laterality: Left;   TEE WITHOUT CARDIOVERSION N/A 06/22/2017   Procedure: TRANSESOPHAGEAL ECHOCARDIOGRAM (TEE);  Surgeon: Sherren Mocha, MD;  Location: [facility];  Service: Open Heart Surgery;  Laterality: N/A;   TRANSCATHETER AORTIC VALVE REPLACEMENT, TRANSFEMORAL N/A 06/22/2017   Procedure: TRANSCATHETER AORTIC VALVE REPLACEMENT, TRANSFEMORAL;  Surgeon: Sherren Mocha, MD;  [facility];  Service: Open Heart Surgery;  Laterality: N/A;   VEIN LIGATION AND STRIPPING Right 1972   right lower leg    Current Medications: Current Meds  Medication Sig   allopurinol (ZYLOPRIM) 300 MG tablet Take 300 mg by mouth daily.   aspirin 81 MG chewable tablet Chew 81 mg by mouth every evening.    Calcium Carbonate-Vitamin D 600-200 MG-UNIT TABS Take 1 tablet by mouth daily.   Cyanocobalamin (VITAMIN B12) 1000 MCG TBCR Take 1,000 mcg by mouth 3 (three) times a week.   glipiZIDE (GLUCOTROL) 10 MG tablet Take 1 tablet by mouth every morning and 1/2 tablet by mouth every evening   HYDROcodone-acetaminophen (NORCO) 10-325 MG tablet Take 1-2 tablets by mouth every 6 (six) hours as needed (pain).   Multiple Vitamin (MULTIVITAMIN) tablet Take 1 tablet by mouth  daily.   nitroGLYCERIN (NITROSTAT) 0.4 MG SL tablet Place 1 tablet (0.4 mg total) under the tongue every 5 (five) minutes as needed for chest pain.   predniSONE (DELTASONE) 5 MG tablet Take 5 mg by mouth daily with breakfast.    rosuvastatin (CRESTOR) 20 MG tablet TAKE 1 TABLET BY MOUTH DAILY   tamsulosin (FLOMAX) 0.4 MG CAPS capsule Take 0.4 mg by mouth daily after supper.   torsemide (DEMADEX) 20 MG tablet TAKE 1 TABLET BY MOUTH EVERY MONDAY, WEDNESDAY, AND FRIDAY (Patient taking differently: as needed.)     Allergies:   Fish allergy, Lipitor [atorvastatin], Penicillins, Simvastatin, Adacel [tetanus-diphth-acell pertussis], Lasix [furosemide], Septra [sulfamethoxazole-trimethoprim], Flexeril [cyclobenzaprine], and Pantoprazole   Social History   Socioeconomic History   Marital status: Married    Spouse name: Not on file   Number of children: Not on file   Years of education: Not on file   Highest education level: Not on file  Occupational History   Not on file  Tobacco Use   Smoking status: Former    Packs/day: 3.00    Years: 30.00    Total pack years: 90.00    Types: Cigarettes    Quit date: 12/21/1980    Years since quitting: 41.8   Smokeless tobacco: Never  Vaping Use   Vaping Use: Never used  Substance and Sexual Activity   Alcohol use: No   Drug use: No   Sexual activity: Not on file  Other Topics Concern   Not on file  Social History Narrative   Lives at home with wife   Right handed   Caffeine: 1 cup/day, occassionally has a 2nd    Social Determinants of Radio broadcast assistant Strain: Not on file  Food Insecurity: Not on file  Transportation Needs: Not on file  Physical Activity: Not on file  Stress: Not on file  Social Connections: Not on file     Family History: The patient's family history includes Heart Problems in his father and mother. There is no history of Neuropathy.  ROS:   Please see the history of present illness.    Seems to be  somewhat depressed.  Labile affect.  He is having chronic back pain that prevents ambulation.  He is planning on retiring from his job in the next year.  He is currently 88 years of age.  He has foot leg and hand neuropathy.  He does not sleep well.  All other systems reviewed and are negative.  EKGs/Labs/Other Studies Reviewed:    The following studies were reviewed today:  2D Doppler echocardiogram February 15, 2020: IMPRESSIONS   1. Left ventricular ejection  fraction, by estimation, is 55 to 60%. The  left ventricle has normal function. The left ventricle has no regional  wall motion abnormalities. Left ventricular diastolic parameters are  consistent with Grade I diastolic  dysfunction (impaired relaxation).   2. Right ventricular systolic function is normal. The right ventricular  size is normal.   3. The mitral valve is normal in structure and function. No evidence of  mitral valve regurgitation. No evidence of mitral stenosis.   4. The aortic valve has been repaired/replaced. Aortic valve  regurgitation is not visualized.   EKG:  EKG sinus rhythm with PACs, left bundle branch block, normal PR interval, and when compared to the tracing from January 2022, no significant changes noted.  Recent Labs: No results found for requested labs within last 365 days.  Recent Lipid Panel    Component Value Date/Time   CHOL 151 04/15/2020 1026   TRIG 89 04/15/2020 1026   HDL 55 04/15/2020 1026   CHOLHDL 2.7 04/15/2020 1026   VLDL 18 04/15/2020 1026   LDLCALC 78 04/15/2020 1026    Physical Exam:    VS:  BP 130/72   Pulse 89   Ht 5' 5.5" (1.664 m)   Wt 180 lb 3.2 oz (81.7 kg)   SpO2 98%   BMI 29.53 kg/m     Wt Readings from Last 3 Encounters:  10/02/22 180 lb 3.2 oz (81.7 kg)  07/28/21 185 lb 9.6 oz (84.2 kg)  01/22/21 189 lb (85.7 kg)     GEN: Overweight.  He has lost weight.  He is down about 10 pounds compared to a year ago.. No acute distress HEENT: Normal NECK: No  JVD. LYMPHATICS: No lymphadenopathy CARDIAC: No murmur. RRR no gallop, or edema. VASCULAR:  Normal Pulses. No bruits. RESPIRATORY:  Clear to auscultation without rales, wheezing or rhonchi  ABDOMEN: Soft, non-tender, non-distended, No pulsatile mass, MUSCULOSKELETAL: No deformity  SKIN: Warm and dry NEUROLOGIC:  Alert and oriented x 3.  PSYCHIATRIC:  Labile affect  ASSESSMENT:    1. Chronic systolic CHF (congestive heart failure) ([place])   2. Coronary artery disease involving coronary bypass graft of native heart without angina pectoris   3. S/P TAVR (transcatheter aortic valve replacement)   4. Stage 3 chronic kidney disease, unspecified whether stage 3a or 3b CKD ([place])   5. Essential hypertension   6. Snoring    PLAN:    In order of problems listed above:  Seen Loralie Champagne before in the heart failure clinic.  He has chronic diastolic dysfunction.  Current therapy includes Demadex for decongestion.  Spironolactone in the past caused significant gynecomastia and discomfort.  He has never trialed an SGLT2.  Chronic kidney disease has limited our options for management of heart failure.  Last echo demonstrated EF of greater than 50%.  A PYP scan was did not reveal evidence of amyloidosis. I am suspicious that the episode of "indigestion" is angina.  It is resolved now.  Nitroglycerin is given.  He is instructed on use.  He should notify us if recurrence. We will need to update a 2D Doppler echocardiogram.  Perhaps this can be done when he returns in 2024.  By auscultation, no significant abnormality.  No evidence of volume overload. Did not discuss.  Recent creatinine 1.7 in June. He does not use CPAP.  Overall education and awareness concerning /secondary risk prevention was discussed in detail: LDL less than 70, hemoglobin A1c less than 7, blood pressure target less than 130/80 mmHg, >150  minutes of moderate aerobic activity per week, avoidance of smoking, weight control (via diet and  exercise), and continued surveillance/management of/for obstructive sleep apnea.   Plan relative conservative management unless highly symptomatic or acute issues arise.  Medication Adjustments/Labs and Tests Ordered: Current medicines are reviewed at length with the patient today.  Concerns regarding medicines are outlined above.  Orders Placed This Encounter  Procedures   EKG 12-Lead   Meds ordered this encounter  Medications   nitroGLYCERIN (NITROSTAT) 0.4 MG SL tablet    Sig: Place 1 tablet (0.4 mg total) under the tongue every 5 (five) minutes as needed for chest pain.    Dispense:  25 tablet    Refill:  5    Patient Instructions  Medication Instructions:  Your physician has recommended you make the following change in your medication:   1) START Nitroglycerin 0.'4mg'$  as needed for chest pain (dissolve 1 tablet under tongue, if chest pain is not better in 5 minutes dissolve another tablet under tongue, if chest pain is still not better after an additional 5 minutes call 911)  *If you need a refill on your cardiac medications before your next appointment, please call your pharmacy*  Lab Work: NONE  Testing/Procedures: NONE  Follow-Up: At [facility], you and your health needs are our priority.  As part of our continuing mission to provide you with exceptional heart care, we have created designated Provider Care Teams.  These Care Teams include your primary Cardiologist (physician) and Advanced Practice Providers (APPs -  Physician Assistants and Nurse Practitioners) who all work together to provide you with the care you need, when you need it.  Your next appointment:   9-12 month(s)  The format for your next appointment:   In Person  Provider:   Sinclair Grooms, MD   Important Information About Sugar         Signed, Sinclair Grooms, MD  10/02/2022 2:42 PM    [place]

## 2022-10-02 ENCOUNTER — Ambulatory Visit: Payer: HMO | Attending: Interventional Cardiology | Admitting: Interventional Cardiology

## 2022-10-02 ENCOUNTER — Encounter: Payer: Self-pay | Admitting: Interventional Cardiology

## 2022-10-02 VITALS — BP 130/72 | HR 89 | Ht 65.5 in | Wt 180.2 lb

## 2022-10-02 DIAGNOSIS — Z952 Presence of prosthetic heart valve: Secondary | ICD-10-CM

## 2022-10-02 DIAGNOSIS — N183 Chronic kidney disease, stage 3 unspecified: Secondary | ICD-10-CM

## 2022-10-02 DIAGNOSIS — I5022 Chronic systolic (congestive) heart failure: Secondary | ICD-10-CM

## 2022-10-02 DIAGNOSIS — I25810 Atherosclerosis of coronary artery bypass graft(s) without angina pectoris: Secondary | ICD-10-CM

## 2022-10-02 DIAGNOSIS — R0683 Snoring: Secondary | ICD-10-CM

## 2022-10-02 DIAGNOSIS — I10 Essential (primary) hypertension: Secondary | ICD-10-CM

## 2022-10-02 MED ORDER — NITROGLYCERIN 0.4 MG SL SUBL: 0.4000 mg | SUBLINGUAL_TABLET | SUBLINGUAL | 5 refills | Status: DC | PRN

## 2022-10-02 NOTE — Patient Instructions (Signed)
Medication Instructions:  Your physician has recommended you make the following change in your medication:   1) START Nitroglycerin 0.'4mg'$  as needed for chest pain (dissolve 1 tablet under tongue, if chest pain is not better in 5 minutes dissolve another tablet under tongue, if chest pain is still not better after an additional 5 minutes call 911)  *If you need a refill on your cardiac medications before your next appointment, please call your pharmacy*  Lab Work: NONE  Testing/Procedures: NONE  Follow-Up: At [facility], you and your health needs are our priority.  As part of our continuing mission to provide you with exceptional heart care, we have created designated Provider Care Teams.  These Care Teams include your primary Cardiologist (physician) and Advanced Practice Providers (APPs -  Physician Assistants and Nurse Practitioners) who all work together to provide you with the care you need, when you need it.  Your next appointment:   9-12 month(s)  The format for your next appointment:   In Person  Provider:   Sinclair Grooms, MD   Important Information About Sugar

## 2022-11-30 ENCOUNTER — Other Ambulatory Visit: Payer: Self-pay | Admitting: Interventional Cardiology

## 2023-01-06 DIAGNOSIS — I700 Atherosclerosis of aorta: Secondary | ICD-10-CM | POA: Diagnosis not present

## 2023-01-06 DIAGNOSIS — D696 Thrombocytopenia, unspecified: Secondary | ICD-10-CM | POA: Diagnosis not present

## 2023-01-06 DIAGNOSIS — Z79899 Other long term (current) drug therapy: Secondary | ICD-10-CM | POA: Diagnosis not present

## 2023-01-06 DIAGNOSIS — E1142 Type 2 diabetes mellitus with diabetic polyneuropathy: Secondary | ICD-10-CM | POA: Diagnosis not present

## 2023-01-06 DIAGNOSIS — G4733 Obstructive sleep apnea (adult) (pediatric): Secondary | ICD-10-CM | POA: Diagnosis not present

## 2023-01-06 DIAGNOSIS — E7800 Pure hypercholesterolemia, unspecified: Secondary | ICD-10-CM | POA: Diagnosis not present

## 2023-01-06 DIAGNOSIS — M545 Low back pain, unspecified: Secondary | ICD-10-CM | POA: Diagnosis not present

## 2023-01-06 DIAGNOSIS — I340 Nonrheumatic mitral (valve) insufficiency: Secondary | ICD-10-CM | POA: Diagnosis not present

## 2023-01-06 DIAGNOSIS — G8929 Other chronic pain: Secondary | ICD-10-CM | POA: Diagnosis not present

## 2023-01-06 DIAGNOSIS — M1A9XX0 Chronic gout, unspecified, without tophus (tophi): Secondary | ICD-10-CM | POA: Diagnosis not present

## 2023-01-06 DIAGNOSIS — N1832 Chronic kidney disease, stage 3b: Secondary | ICD-10-CM | POA: Diagnosis not present

## 2023-01-06 DIAGNOSIS — I5022 Chronic systolic (congestive) heart failure: Secondary | ICD-10-CM | POA: Diagnosis not present

## 2023-01-25 DIAGNOSIS — N401 Enlarged prostate with lower urinary tract symptoms: Secondary | ICD-10-CM | POA: Diagnosis not present

## 2023-01-25 DIAGNOSIS — R3915 Urgency of urination: Secondary | ICD-10-CM | POA: Diagnosis not present

## 2023-01-25 DIAGNOSIS — R3914 Feeling of incomplete bladder emptying: Secondary | ICD-10-CM | POA: Diagnosis not present

## 2023-01-26 DIAGNOSIS — Z961 Presence of intraocular lens: Secondary | ICD-10-CM | POA: Diagnosis not present

## 2023-04-21 DIAGNOSIS — C679 Malignant neoplasm of bladder, unspecified: Secondary | ICD-10-CM

## 2023-04-21 HISTORY — DX: Malignant neoplasm of bladder, unspecified: C67.9

## 2023-05-05 ENCOUNTER — Telehealth: Payer: Self-pay | Admitting: Physician Assistant

## 2023-05-05 NOTE — Telephone Encounter (Signed)
   Pre-operative Risk Assessment    Patient Name: [name]  DOB: 12/28/1933 MRN: [id number]      Request for Surgical Clearance    Procedure:  bladder tumor removed  Date of Surgery:  Clearance 05/10/23                                 Surgeon:  Dr. Patsi Sears Surgeon's Group or Practice Name:  [facility] Phone number:  [phone] Fax number:  [phone]   Type of Clearance Requested:   - Pharmacy:  Hold Aspirin  5 days prior   Type of Anesthesia:  General    Additional requests/questions:    SignedDamaris Schooner   05/05/2023, 4:49 PM

## 2023-05-06 ENCOUNTER — Telehealth: Payer: Self-pay | Admitting: *Deleted

## 2023-05-06 ENCOUNTER — Other Ambulatory Visit: Payer: Self-pay | Admitting: Urology

## 2023-05-06 ENCOUNTER — Ambulatory Visit: Payer: PPO | Attending: Nurse Practitioner | Admitting: Nurse Practitioner

## 2023-05-06 ENCOUNTER — Encounter: Payer: Self-pay | Admitting: Nurse Practitioner

## 2023-05-06 DIAGNOSIS — Z01810 Encounter for preprocedural cardiovascular examination: Secondary | ICD-10-CM

## 2023-05-06 NOTE — Telephone Encounter (Signed)
Pt has been added to 3:40 today ok per pre op APP. Med rec and consent are done    Patient Consent for Virtual Visit        [name] has provided verbal consent on 05/06/2023 for a virtual visit (video or telephone).   CONSENT FOR VIRTUAL VISIT FOR:  [name]  By participating in this virtual visit I agree to the following:  I hereby voluntarily request, consent and authorize [facility] and its employed or contracted physicians, physician assistants, nurse practitioners or other licensed health care professionals (the Practitioner), to provide me with telemedicine health care services (the "Services") as deemed necessary by the treating Practitioner. I acknowledge and consent to receive the Services by the Practitioner via telemedicine. I understand that the telemedicine visit will involve communicating with the Practitioner through live audiovisual communication technology and the disclosure of certain medical information by electronic transmission. I acknowledge that I have been given the opportunity to request an in-person assessment or other available alternative prior to the telemedicine visit and am voluntarily participating in the telemedicine visit.  I understand that I have the right to withhold or withdraw my consent to the use of telemedicine in the course of my care at any time, without affecting my right to future care or treatment, and that the Practitioner or I may terminate the telemedicine visit at any time. I understand that I have the right to inspect all information obtained and/or recorded in the course of the telemedicine visit and may receive copies of available information for a reasonable fee.  I understand that some of the potential risks of receiving the Services via telemedicine include:  Delay or interruption in medical evaluation due to technological equipment failure or disruption; Information transmitted may not be sufficient (e.g. poor resolution  of images) to allow for appropriate medical decision making by the Practitioner; and/or  In rare instances, security protocols could fail, causing a breach of personal health information.  Furthermore, I acknowledge that it is my responsibility to provide information about my medical history, conditions and care that is complete and accurate to the best of my ability. I acknowledge that Practitioner's advice, recommendations, and/or decision may be based on factors not within their control, such as incomplete or inaccurate data provided by me or distortions of diagnostic images or specimens that may result from electronic transmissions. I understand that the practice of medicine is not an exact science and that Practitioner makes no warranties or guarantees regarding treatment outcomes. I acknowledge that a copy of this consent can be made available to me via my patient portal [facility] MyChart), or I can request a printed copy by calling the office of [facility].    I understand that my insurance will be billed for this visit.   I have read or had this consent read to me. I understand the contents of this consent, which adequately explains the benefits and risks of the Services being provided via telemedicine.  I have been provided ample opportunity to ask questions regarding this consent and the Services and have had my questions answered to my satisfaction. I give my informed consent for the services to be provided through the use of telemedicine in my medical care

## 2023-05-06 NOTE — Progress Notes (Signed)
Virtual Visit via Telephone Note   Because of [name]'s co-morbid illnesses, he is at least at moderate risk for complications without adequate follow up.  This format is felt to be most appropriate for this patient at this time.  The patient did not have access to video technology/had technical difficulties with video requiring transitioning to audio format only (telephone).  All issues noted in this document were discussed and addressed.  No physical exam could be performed with this format.  Please refer to the patient's chart for his consent to telehealth for [facility].  Evaluation Performed:  Preoperative cardiovascular risk assessment _____________   Date:  05/06/2023   Patient ID:  [name], DOB 07/21/1934, MRN [id number] Patient Location:  Home Provider location:   Office  Primary Care Provider:  Emilio Aspen, MD Primary Cardiologist:  Christell Constant, MD  Chief Complaint / Patient Profile   89 y.o. y/o male with a h/o prostate cancer s/p XRT, CKD secondary to RCC s/p partial left nephrectomy, hypertension, HLD, CAD s/p CABG 2007, type 2 diabetes, chronic HFpEF, and severe AS s/p TAVR 06/2018 who is pending bladder tumor removal and presents today for telephonic preoperative cardiovascular risk assessment.  History of Present Illness    [name] is a 89 y.o. male who presents via audio/video conferencing for a telehealth visit today.  Pt was last seen in cardiology clinic on 10/02/22 by Dr. Katrinka Blazing.  At that time [name] was doing well.  The patient is now pending procedure as outlined above. Since his last visit, he  denies chest pain, shortness of breath, lower extremity edema, fatigue, palpitations, melena, hematuria, hemoptysis, diaphoresis, weakness, presyncope, syncope, orthopnea, and PND. He remains active at home working on cars and doing yard work. He is able to achieve > 4 METS activity without concerning cardiac symptoms.     Past Medical History    Past Medical History:  Diagnosis Date   Chondromalacia    Chronic systolic CHF (congestive heart failure) (HCC)    Coronary artery disease    a. 2007: s/p CABG x3V (LIMA--> LAD, SVG--> PDA, SVG--> ramus)  c. 2017: LHC with 2/3 patent bypass grafts with occuluded SVG--> Ramus    Diabetes mellitus (HCC)    GERD (gastroesophageal reflux disease)    Glaucoma    "high normal"   Gout    H/O blood clots    R Lower leg, right upper leg   History of renal cell cancer    Hyperlipidemia    Hypertension    Infection 2016   spread to blood after back surgery   Joint pain    Low back pain    "severe"   Obesity    Osteoporosis    Polymyalgia (HCC)    h/o   Prostate cancer (HCC)    Severe aortic stenosis    a. 06/2017: s/p TAVR   Spinal stenosis    Thrombophlebitis    Tubular adenoma    Past Surgical History:  Procedure Laterality Date   APPENDECTOMY  60 years ago   BACK SURGERY     CARDIAC CATHETERIZATION     CARDIAC CATHETERIZATION N/A 11/03/2016   Procedure: Right/Left Heart Cath and Coronary/Graft Angiography;  Surgeon: Lyn Records, MD;  Location: [facility] INVASIVE CV LAB;  Service: Cardiovascular;  Laterality: N/A;   CORONARY ARTERY BYPASS GRAFT  2007   EYE SURGERY Right    /w IOL, post cataracts    PROSTATE SURGERY  seed implant   ROBOTIC ASSITED PARTIAL NEPHRECTOMY Left 08/31/2013   Procedure: ROBOTIC ASSITED PARTIAL NEPHRECTOMY;  Surgeon: Crecencio Mc, MD;  Location: [facility] ORS;  Service: Urology;  Laterality: Left;   TEE WITHOUT CARDIOVERSION N/A 06/22/2017   Procedure: TRANSESOPHAGEAL ECHOCARDIOGRAM (TEE);  Surgeon: Tonny Bollman, MD;  Location: [facility];  Service: Open Heart Surgery;  Laterality: N/A;   TRANSCATHETER AORTIC VALVE REPLACEMENT, TRANSFEMORAL N/A 06/22/2017   Procedure: TRANSCATHETER AORTIC VALVE REPLACEMENT, TRANSFEMORAL;  Surgeon: Tonny Bollman, MD;  Location: [facility] OR;  Service: Open Heart Surgery;  Laterality: N/A;   VEIN LIGATION AND  STRIPPING Right 1972   right lower leg    Allergies  Allergies  Allergen Reactions   Fish Allergy Swelling    Dark fish- salmon, tuna    Lipitor [Atorvastatin] Other (See Comments)    Muscle pain   Penicillins Swelling    SWELLING OF THE FEET  Has patient had a PCN reaction causing immediate rash, facial/tongue/throat swelling, SOB or lightheadedness with hypotension: no Has patient had a PCN reaction causing severe rash involving mucus membranes or skin necrosis: no Has patient had a PCN reaction that required hospitalization no Has patient had a PCN reaction occurring within the last 10 years: no If all of the above answers are "NO", then may proceed with Cephalosporin use.   Simvastatin Other (See Comments)    MUSCLE PAIN   Adacel [Tetanus-Diphth-Acell Pertussis]     UNSPECIFIED REACTION    Lasix [Furosemide] Swelling    SWELLING REACTION UNSPECIFIED    Septra [Sulfamethoxazole-Trimethoprim]     UNSPECIFIED REACTION    Flexeril [Cyclobenzaprine] Other (See Comments)    Causes confusion   Pantoprazole Diarrhea    Home Medications    Prior to Admission medications   Medication Sig Start Date End Date Taking? Authorizing Provider  allopurinol (ZYLOPRIM) 300 MG tablet Take 300 mg by mouth daily. Patient not taking: Reported on 05/06/2023 02/07/15   [provider]  aspirin EC 81 MG tablet Take 81 mg by mouth every evening.    [provider]  Calcium Carbonate-Vitamin D 600-200 MG-UNIT TABS Take 1 tablet by mouth daily.    [provider]  Cyanocobalamin (VITAMIN B12) 1000 MCG TBCR Take 1,000 mcg by mouth 3 (three) times a week. 10/25/20   [provider]  ferrous sulfate 325 (65 FE) MG EC tablet Take 325 mg by mouth once a week. Patient not taking: Reported on 10/02/2022 10/25/20   [provider]  gabapentin (NEURONTIN) 100 MG capsule Take 100 mg by mouth 3 (three) times daily. Patient not taking: Reported on 10/02/2022 10/10/20    [provider]  glipiZIDE (GLUCOTROL) 10 MG tablet Take 1 tablet by mouth every morning and 1/2 tablet by mouth every evening    [provider]  HYDROcodone-acetaminophen (NORCO) 10-325 MG tablet Take 1-2 tablets by mouth every 6 (six) hours as needed (pain).    [provider]  indomethacin (INDOCIN) 25 MG capsule Take 25 mg by mouth daily as needed.    [provider]  ketoconazole (NIZORAL) 2 % cream SMARTSIG:1 Topical Daily PRN Patient not taking: Reported on 10/02/2022 04/02/21   [provider]  Multiple Vitamin (MULTIVITAMIN) tablet Take 1 tablet by mouth daily.    [provider]  nitroGLYCERIN (NITROSTAT) 0.4 MG SL tablet Place 1 tablet (0.4 mg total) under the tongue every 5 (five) minutes as needed for chest pain. 10/02/22   Lyn Records, MD  predniSONE (DELTASONE) 5 MG tablet  Take 5 mg by mouth daily with breakfast.     [provider]  rosuvastatin (CRESTOR) 20 MG tablet TAKE 1 TABLET BY MOUTH DAILY Patient not taking: Reported on 05/06/2023 08/19/20   Laurey Morale, MD  rosuvastatin (CRESTOR) 5 MG tablet Take 5 mg by mouth every Monday, Wednesday, and Friday.    [provider]  tamsulosin (FLOMAX) 0.4 MG CAPS capsule Take 0.4 mg by mouth daily after supper.    [provider]  torsemide (DEMADEX) 20 MG tablet TAKE 1 TABLET BY MOUTH EVERY MONDAY, WEDNESDAY, AND FRIDAY Patient not taking: Reported on 05/06/2023 11/30/22   Lyn Records, MD    Physical Exam    Vital Signs:  [name] does not have vital signs available for review today.  Given telephonic nature of communication, physical exam is limited. AAOx3. NAD. Normal affect.  Speech and respirations are unlabored.  Accessory Clinical Findings    None  Assessment & Plan    1.  Preoperative Cardiovascular Risk Assessment: According to the Revised Cardiac Risk Index (RCRI), his Perioperative Risk of Major Cardiac Event is (%): 11. His  Functional Capacity in METs is: 4.52 according to the [employer] Activity Status Index (DASI). The patient is doing well from a cardiac perspective. Therefore, based on ACC/AHA guidelines, the patient would be at acceptable risk for the planned procedure without further cardiovascular testing.   The patient was advised that if he develops new symptoms prior to surgery to contact our office to arrange for a follow-up visit, and he verbalized understanding.  Per office protocol, he may hold aspirin for 7 days prior to procedure and should resume as soon as hemodynamically stable postoperatively.  A copy of this note will be routed to requesting surgeon.  Time:   Today, I have spent 10 minutes with the patient with telehealth technology discussing medical history, symptoms, and management plan.    Levi Aland, NP-C  05/06/2023, 3:43 PM [address] Office [phone] Fax [phone]

## 2023-05-06 NOTE — Telephone Encounter (Signed)
Primary Cardiologist:Steven W Leia Alf, MD (Inactive)   Preoperative team, please contact this patient and set up a phone call appointment for further preoperative risk assessment. Please obtain consent and complete medication review. Thank you for your help.   Request to hold aspirin will be addressed during virtual visit.   Steven Aland, NP-C  05/06/2023, 8:41 AM [address], [address] Office [phone] Fax [phone]

## 2023-05-06 NOTE — Telephone Encounter (Signed)
Pt has been added to 3:40 today ok per pre op APP. Med rec and consent are done

## 2023-05-07 ENCOUNTER — Encounter (HOSPITAL_BASED_OUTPATIENT_CLINIC_OR_DEPARTMENT_OTHER): Payer: Self-pay | Admitting: Urology

## 2023-05-07 NOTE — Progress Notes (Signed)
Spoke w/ via phone for pre-op interview--- pt Lab needs dos---- [employer] results------  current EKG in epic/ chart COVID test -----patient states asymptomatic no test needed Arrive at -------  0830 on 05-10-2023 NPO after MN NO Solid Food.  Clear liquids from MN until--- 0730 Med rec completed Medications to take morning of surgery ----- norco, flomax Diabetic medication ----- do not take glipizide morning of surgery Patient instructed no nail polish to be worn day of surgery Patient instructed to bring photo id and insurance card day of surgery Patient aware to have Driver (ride ) / caregiver    for 24 hours after surgery -- wife, phyllis Patient Special Instructions ----- n/a Pre-Op special Instructions -----  pt has telephone cardiac clearance by Eligha Bridegroom NP on 05-06-2023 in epic/ chart.   Patient verbalized understanding of instructions that were given at this phone interview. Patient denies shortness of breath, chest pain, fever, cough at this phone interview.  Anesthesia Review:  HTN;  CAD s/p PTCA 1993 and s/p CABG x4 2007;  07/ 2018 s/p TAVR;  chronic systolic CHF preserved ef;  DM2; CKD 3;  hx RCC 2014;  hx prostate ca 2010;  CPS w/ chronic daily narcotic use Pt denies cardic s&s, unable to do long distance walk/ stairs d/t back pain, no peripheral swelling. Has never taken a nitro since having prescription.  PCP:  Dr Sheppard Coil Cardiologist :  Dr Mendel Ryder  (lov 10-02-2022 ) Chest x-ray :  CT 12-08-2018 EKG :  10-02-2022 Echo :  07-14-2020 Stress test:  prior to AVR 10-22-2016 NUC;  stress echo 04-26-2017 Cardiac Cath : 11-03-2016 Activity level: see above Sleep Study/ CPAP :no Fasting Blood Sugar :      / Checks Blood Sugar -- times a day:  occasionally  Blood Thinner/ Instructions /Last Dose: no ASA / Instructions/ Last Dose : ASA 81mg  Pt stated was given instructions by cardiology to let last night 05-06-2023 be last dose prior to surgery on  05-10-2023.

## 2023-05-10 ENCOUNTER — Ambulatory Visit (HOSPITAL_BASED_OUTPATIENT_CLINIC_OR_DEPARTMENT_OTHER)
Admission: RE | Admit: 2023-05-10 | Discharge: 2023-05-10 | Disposition: A | Discharge status: Home / Self Care | Payer: PPO | Attending: Urology | Admitting: Urology

## 2023-05-10 ENCOUNTER — Ambulatory Visit (HOSPITAL_BASED_OUTPATIENT_CLINIC_OR_DEPARTMENT_OTHER): Payer: PPO | Admitting: Certified Registered"

## 2023-05-10 ENCOUNTER — Encounter (HOSPITAL_BASED_OUTPATIENT_CLINIC_OR_DEPARTMENT_OTHER)
Admission: RE | Disposition: A | Discharge status: Home / Self Care | Payer: Self-pay | Source: Home / Self Care | Attending: Urology

## 2023-05-10 ENCOUNTER — Other Ambulatory Visit: Payer: Self-pay

## 2023-05-10 ENCOUNTER — Encounter (HOSPITAL_BASED_OUTPATIENT_CLINIC_OR_DEPARTMENT_OTHER): Payer: Self-pay | Admitting: Urology

## 2023-05-10 DIAGNOSIS — I110 Hypertensive heart disease with heart failure: Secondary | ICD-10-CM

## 2023-05-10 DIAGNOSIS — Z87891 Personal history of nicotine dependence: Secondary | ICD-10-CM

## 2023-05-10 DIAGNOSIS — I350 Nonrheumatic aortic (valve) stenosis: Secondary | ICD-10-CM | POA: Insufficient documentation

## 2023-05-10 DIAGNOSIS — I2510 Atherosclerotic heart disease of native coronary artery without angina pectoris: Secondary | ICD-10-CM | POA: Diagnosis not present

## 2023-05-10 DIAGNOSIS — I5022 Chronic systolic (congestive) heart failure: Secondary | ICD-10-CM | POA: Insufficient documentation

## 2023-05-10 DIAGNOSIS — N183 Chronic kidney disease, stage 3 unspecified: Secondary | ICD-10-CM | POA: Diagnosis not present

## 2023-05-10 DIAGNOSIS — Z8546 Personal history of malignant neoplasm of prostate: Secondary | ICD-10-CM | POA: Insufficient documentation

## 2023-05-10 DIAGNOSIS — I509 Heart failure, unspecified: Secondary | ICD-10-CM

## 2023-05-10 DIAGNOSIS — D631 Anemia in chronic kidney disease: Secondary | ICD-10-CM | POA: Diagnosis not present

## 2023-05-10 DIAGNOSIS — E1151 Type 2 diabetes mellitus with diabetic peripheral angiopathy without gangrene: Secondary | ICD-10-CM | POA: Diagnosis not present

## 2023-05-10 DIAGNOSIS — I252 Old myocardial infarction: Secondary | ICD-10-CM

## 2023-05-10 DIAGNOSIS — I130 Hypertensive heart and chronic kidney disease with heart failure and stage 1 through stage 4 chronic kidney disease, or unspecified chronic kidney disease: Secondary | ICD-10-CM | POA: Insufficient documentation

## 2023-05-10 DIAGNOSIS — Z923 Personal history of irradiation: Secondary | ICD-10-CM | POA: Insufficient documentation

## 2023-05-10 DIAGNOSIS — C674 Malignant neoplasm of posterior wall of bladder: Secondary | ICD-10-CM | POA: Diagnosis present

## 2023-05-10 DIAGNOSIS — Z905 Acquired absence of kidney: Secondary | ICD-10-CM | POA: Diagnosis not present

## 2023-05-10 DIAGNOSIS — Z85528 Personal history of other malignant neoplasm of kidney: Secondary | ICD-10-CM | POA: Insufficient documentation

## 2023-05-10 DIAGNOSIS — C678 Malignant neoplasm of overlapping sites of bladder: Secondary | ICD-10-CM

## 2023-05-10 DIAGNOSIS — E1122 Type 2 diabetes mellitus with diabetic chronic kidney disease: Secondary | ICD-10-CM | POA: Diagnosis not present

## 2023-05-10 DIAGNOSIS — Z01818 Encounter for other preprocedural examination: Secondary | ICD-10-CM

## 2023-05-10 DIAGNOSIS — C679 Malignant neoplasm of bladder, unspecified: Secondary | ICD-10-CM

## 2023-05-10 HISTORY — DX: Polyneuropathy, unspecified: G62.9

## 2023-05-10 HISTORY — DX: Carpal tunnel syndrome, bilateral upper limbs: G56.03

## 2023-05-10 HISTORY — DX: Other intervertebral disc degeneration, lumbosacral region: M51.37

## 2023-05-10 HISTORY — DX: Anemia in chronic kidney disease: D63.1

## 2023-05-10 HISTORY — DX: Benign prostatic hyperplasia with lower urinary tract symptoms: N40.1

## 2023-05-10 HISTORY — DX: Low back pain, unspecified: M54.50

## 2023-05-10 HISTORY — DX: Type 2 diabetes mellitus without complications: E11.9

## 2023-05-10 HISTORY — DX: Presence of dental prosthetic device (complete) (partial): Z97.2

## 2023-05-10 HISTORY — DX: Secondary hyperparathyroidism of renal origin: N25.81

## 2023-05-10 HISTORY — DX: Personal history of colonic polyps: Z86.010

## 2023-05-10 HISTORY — DX: Personal history of adenomatous and serrated colon polyps: Z86.0101

## 2023-05-10 HISTORY — DX: Chronic gout, unspecified, without tophus (tophi): M1A.9XX0

## 2023-05-10 HISTORY — DX: Other constipation: K59.09

## 2023-05-10 HISTORY — DX: Solitary pulmonary nodule: R91.1

## 2023-05-10 HISTORY — DX: Other chronic pain: G89.29

## 2023-05-10 HISTORY — DX: Opioid use, unspecified, uncomplicated: F11.90

## 2023-05-10 HISTORY — DX: Other intervertebral disc degeneration, lumbosacral region without mention of lumbar back pain or lower extremity pain: M51.379

## 2023-05-10 HISTORY — DX: Anemia in chronic kidney disease: N18.9

## 2023-05-10 LAB — POCT I-STAT, CHEM 8
BUN: 38 mg/dL — ABNORMAL HIGH (ref 8–23)
Calcium, Ion: 1.25 mmol/L (ref 1.15–1.40)
Chloride: 111 mmol/L (ref 98–111)
Creatinine, Ser: 1.7 mg/dL — ABNORMAL HIGH (ref 0.61–1.24)
Glucose, Bld: 185 mg/dL — ABNORMAL HIGH (ref 70–99)
HCT: 36 % — ABNORMAL LOW (ref 39.0–52.0)
Hemoglobin: 12.2 g/dL — ABNORMAL LOW (ref 13.0–17.0)
Potassium: 4.3 mmol/L (ref 3.5–5.1)
Sodium: 140 mmol/L (ref 135–145)
TCO2: 20 mmol/L — ABNORMAL LOW (ref 22–32)

## 2023-05-10 LAB — GLUCOSE, CAPILLARY: Glucose-Capillary: 151 mg/dL — ABNORMAL HIGH (ref 70–99)

## 2023-05-10 SURGERY — TURBT, WITH CHEMOTHERAPEUTIC AGENT INSTILLATION INTO BLADDER
Anesthesia: General | Site: Bladder

## 2023-05-10 MED ORDER — OXYCODONE HCL 5 MG PO TABS
5.0000 mg | ORAL_TABLET | Freq: Once | ORAL | Status: AC | PRN
  Administered 2023-05-10: 5 mg via ORAL

## 2023-05-10 MED ORDER — HYDROMORPHONE HCL 1 MG/ML IJ SOLN
INTRAMUSCULAR | Status: AC
  Filled 2023-05-10: qty 1

## 2023-05-10 MED ORDER — ONDANSETRON HCL 4 MG/2ML IJ SOLN
INTRAMUSCULAR | Status: DC | PRN
  Administered 2023-05-10: 4 mg via INTRAVENOUS

## 2023-05-10 MED ORDER — ROCURONIUM BROMIDE 10 MG/ML (PF) SYRINGE
PREFILLED_SYRINGE | INTRAVENOUS | Status: AC
  Filled 2023-05-10: qty 10

## 2023-05-10 MED ORDER — CEFAZOLIN SODIUM-DEXTROSE 2-4 GM/100ML-% IV SOLN
2.0000 g | INTRAVENOUS | Status: AC
  Administered 2023-05-10: 2 g via INTRAVENOUS

## 2023-05-10 MED ORDER — FENTANYL CITRATE (PF) 100 MCG/2ML IJ SOLN
INTRAMUSCULAR | Status: DC | PRN
  Administered 2023-05-10 (×2): 50 ug via INTRAVENOUS

## 2023-05-10 MED ORDER — ROCURONIUM BROMIDE 10 MG/ML (PF) SYRINGE
PREFILLED_SYRINGE | INTRAVENOUS | Status: DC | PRN
  Administered 2023-05-10: 20 mg via INTRAVENOUS

## 2023-05-10 MED ORDER — SUGAMMADEX SODIUM 200 MG/2ML IV SOLN
INTRAVENOUS | Status: DC | PRN
  Administered 2023-05-10: 200 mg via INTRAVENOUS

## 2023-05-10 MED ORDER — SODIUM CHLORIDE 0.9 % IV SOLN
INTRAVENOUS | Status: DC
  Administered 2023-05-10: 1000 mL via INTRAVENOUS

## 2023-05-10 MED ORDER — OXYCODONE HCL 5 MG PO TABS
ORAL_TABLET | ORAL | Status: AC
  Filled 2023-05-10: qty 1

## 2023-05-10 MED ORDER — CEFAZOLIN SODIUM-DEXTROSE 2-4 GM/100ML-% IV SOLN
INTRAVENOUS | Status: AC
  Filled 2023-05-10: qty 100

## 2023-05-10 MED ORDER — LIDOCAINE HCL (PF) 2 % IJ SOLN
INTRAMUSCULAR | Status: AC
  Filled 2023-05-10: qty 5

## 2023-05-10 MED ORDER — LIDOCAINE 2% (20 MG/ML) 5 ML SYRINGE
INTRAMUSCULAR | Status: DC | PRN
  Administered 2023-05-10: 80 mg via INTRAVENOUS

## 2023-05-10 MED ORDER — PROMETHAZINE HCL 25 MG/ML IJ SOLN: 6.2500 mg | INTRAMUSCULAR | Status: DC | PRN

## 2023-05-10 MED ORDER — DEXAMETHASONE SODIUM PHOSPHATE 10 MG/ML IJ SOLN
INTRAMUSCULAR | Status: DC | PRN
  Administered 2023-05-10: 8 mg via INTRAVENOUS

## 2023-05-10 MED ORDER — SODIUM CHLORIDE 0.9 % IR SOLN
Status: DC | PRN
  Administered 2023-05-10: 6000 mL

## 2023-05-10 MED ORDER — AMISULPRIDE (ANTIEMETIC) 5 MG/2ML IV SOLN: 10.0000 mg | Freq: Once | INTRAVENOUS | Status: DC | PRN

## 2023-05-10 MED ORDER — PROPOFOL 10 MG/ML IV BOLUS
INTRAVENOUS | Status: DC | PRN
  Administered 2023-05-10: 120 mg via INTRAVENOUS

## 2023-05-10 MED ORDER — SUCCINYLCHOLINE CHLORIDE 200 MG/10ML IV SOSY
PREFILLED_SYRINGE | INTRAVENOUS | Status: AC
  Filled 2023-05-10: qty 10

## 2023-05-10 MED ORDER — HYDROMORPHONE HCL 1 MG/ML IJ SOLN
0.2500 mg | INTRAMUSCULAR | Status: DC | PRN
  Administered 2023-05-10: 0.25 mg via INTRAVENOUS
  Administered 2023-05-10: 0.5 mg via INTRAVENOUS
  Administered 2023-05-10: 0.25 mg via INTRAVENOUS

## 2023-05-10 MED ORDER — DEXMEDETOMIDINE HCL IN NACL 80 MCG/20ML IV SOLN
INTRAVENOUS | Status: DC | PRN
  Administered 2023-05-10 (×2): 4 ug via INTRAVENOUS

## 2023-05-10 MED ORDER — SUCCINYLCHOLINE CHLORIDE 200 MG/10ML IV SOSY
PREFILLED_SYRINGE | INTRAVENOUS | Status: DC | PRN
  Administered 2023-05-10: 120 mg via INTRAVENOUS

## 2023-05-10 MED ORDER — PROPOFOL 10 MG/ML IV BOLUS
INTRAVENOUS | Status: AC
  Filled 2023-05-10: qty 20

## 2023-05-10 MED ORDER — FENTANYL CITRATE (PF) 100 MCG/2ML IJ SOLN
INTRAMUSCULAR | Status: AC
  Filled 2023-05-10: qty 2

## 2023-05-10 MED ORDER — DEXAMETHASONE SODIUM PHOSPHATE 10 MG/ML IJ SOLN
INTRAMUSCULAR | Status: AC
  Filled 2023-05-10: qty 1

## 2023-05-10 MED ORDER — OXYCODONE HCL 5 MG/5ML PO SOLN: 5.0000 mg | Freq: Once | ORAL | Status: AC | PRN

## 2023-05-10 MED ORDER — GEMCITABINE CHEMO FOR BLADDER INSTILLATION 2000 MG
2000.0000 mg | Freq: Once | INTRAVENOUS | Status: AC
  Administered 2023-05-10: 2000 mg via INTRAVESICAL
  Filled 2023-05-10: qty 2000

## 2023-05-10 MED ORDER — ONDANSETRON HCL 4 MG/2ML IJ SOLN
INTRAMUSCULAR | Status: AC
  Filled 2023-05-10: qty 2

## 2023-05-10 SURGICAL SUPPLY — 33 items
BAG DRAIN URO-CYSTO SKYTR STRL (DRAIN) ×1 IMPLANT
BAG DRN RND TRDRP ANRFLXCHMBR (UROLOGICAL SUPPLIES) ×1
BAG DRN UROCATH (DRAIN) ×1
BAG URINE DRAIN 2000ML AR STRL (UROLOGICAL SUPPLIES) IMPLANT
BAG URINE LEG 500ML (DRAIN) IMPLANT
BLANKET WARM UPPER BOD BAIR (MISCELLANEOUS) ×1 IMPLANT
CATH FOLEY 2WAY SLVR  5CC 18FR (CATHETERS) ×1
CATH FOLEY 2WAY SLVR  5CC 20FR (CATHETERS)
CATH FOLEY 2WAY SLVR  5CC 22FR (CATHETERS)
CATH FOLEY 2WAY SLVR 5CC 18FR (CATHETERS) IMPLANT
CATH FOLEY 2WAY SLVR 5CC 20FR (CATHETERS) IMPLANT
CATH FOLEY 2WAY SLVR 5CC 22FR (CATHETERS) IMPLANT
CATH FOLEY 3WAY 30CC 22FR (CATHETERS) IMPLANT
CLOTH BEACON ORANGE TIMEOUT ST (SAFETY) ×1 IMPLANT
ELECT REM PT RETURN 9FT ADLT (ELECTROSURGICAL) ×1
ELECTRODE REM PT RTRN 9FT ADLT (ELECTROSURGICAL) ×1 IMPLANT
EVACUATOR MICROVAS BLADDER (UROLOGICAL SUPPLIES) IMPLANT
GLOVE BIO SURGEON STRL SZ8 (GLOVE) ×1 IMPLANT
GOWN STRL REUS W/TWL XL LVL3 (GOWN DISPOSABLE) ×1 IMPLANT
HOLDER FOLEY CATH W/STRAP (MISCELLANEOUS) IMPLANT
IV NS IRRIG 3000ML ARTHROMATIC (IV SOLUTION) ×1 IMPLANT
KIT TURNOVER CYSTO (KITS) ×1 IMPLANT
LOOP CUT BIPOLAR 24F LRG (ELECTROSURGICAL) ×1 IMPLANT
MANIFOLD NEPTUNE II (INSTRUMENTS) ×1 IMPLANT
NS IRRIG 500ML POUR BTL (IV SOLUTION) ×1 IMPLANT
PACK CYSTO (CUSTOM PROCEDURE TRAY) ×1 IMPLANT
PLUG CATH AND CAP STRL 200 (CATHETERS) IMPLANT
SLEEVE SCD COMPRESS KNEE MED (STOCKING) ×1 IMPLANT
SYR TOOMEY IRRIG 70ML (MISCELLANEOUS) ×1
SYRINGE TOOMEY IRRIG 70ML (MISCELLANEOUS) ×1 IMPLANT
TUBE CONNECTING 12X1/4 (SUCTIONS) ×1 IMPLANT
TUBING UROLOGY SET (TUBING) IMPLANT
WATER STERILE IRR 500ML POUR (IV SOLUTION) IMPLANT

## 2023-05-10 NOTE — H&P (Signed)
H&P  Chief Complaint: Bladder tumors  History of Present Illness: 89 year old male with history of renal cell carcinoma as well as prostate carcinoma, both treated and cured presents at this time for cystoscopy, TURBT with gemcitabine.  Recent cystoscopy revealed the patient have multiple small bladder tumors posteriorly.  I discussed the procedure with the patient, risks and complications.  He has decided to proceed.  Past Medical History:  Diagnosis Date   Anemia due to chronic kidney disease    Benign localized prostatic hyperplasia with lower urinary tract symptoms (LUTS)    Bladder cancer (HCC) 04/2023   dr Lavada Langsam   Carpal tunnel syndrome on both sides    Chronic constipation    Chronic gout without tophus    followed by pcp    (05-07-2023  per pt last flare-up 6-8 months ago, usually affects bilateral great toes)   Chronic low back pain    per pt s/p RFA's   Chronic narcotic use    Chronic systolic CHF (congestive heart failure) (HCC)    followed by cardiology;  preserved ef   CKD (chronic kidney disease), stage III (HCC) 2017   previously seen by nephrologsit--- dr Hyman Hopes   Coronary artery disease 1993   cardiologist--- dr h. Katrinka Blazing;   1993-- s/p cath w/ PTCA to occluded RCA;  a. 2007: s/p CABG x4 (LIMA--> LAD, rSVG--> RI, rSVG-seq -->dRCA and PLA))  c. 2017: LHC with 2/3 patent bypass grafts with occuluded SVG--> RI   DDD (degenerative disc disease), lumbosacral    GERD (gastroesophageal reflux disease)    History of adenomatous polyp of colon    History of cardiomyopathy 09/2016   in setting severe aortic stenosis and new acute on chronic systolic CHF, ef 91-47%   History of DVT of lower extremity 2008   superficial thrombophlebitis-- R Lower leg, right upper leg   History of polymyalgia rheumatica 2011   per pt hx was treated and resolved after being dx approx 2011   History of prostate cancer 2009   urologist--- dr Retta Diones;   dx 2009,  Gleason 4+3;   02-08-2009  s/p  radioactive prostate seed implants by dr Vonita Moss  (05-07-2023  PSA undetectable)   History of renal cell cancer 2014   primary urologist-- dr Frederich Cha;   incidental finding on imaging for back , left renal mass;   08-31-2013  s/p partial left nephrecotmy   History of septic shock 2016   post op  lumbar back surgery, positive blood culture  (discitis/ ostomyelitis)   Hyperlipidemia    Hypertension    Peripheral neuropathy    hands and feet   Pulmonary nodule, right    solitary ,  last Chest CT in epic 12-08-2018 stable   S/P aortic valve replacement with prosthetic valve 07/02/2017   @[facility]  by Dr Laneta Simmers w/ Stephani Police prosthesis  for severe aortic valve stenosis  (nonrheumatic)   Secondary hyperparathyroidism of renal origin (HCC)    Type 2 diabetes mellitus (HCC)    followed by pcp;   (05-07-2023  per pt only check blood sugar occasionlly)   Wears partial dentures    upper    Past Surgical History:  Procedure Laterality Date   APPENDECTOMY     child   CARDIAC CATHETERIZATION N/A 11/03/2016   Procedure: Right/Left Heart Cath and Coronary/Graft Angiography;  Surgeon: Lyn Records, MD;  Location: [facility] INVASIVE CV LAB;  Service: Cardiovascular;  Laterality: N/A;   CARDIAC CATHETERIZATION  03/30/2006   @[facility]  by dr h. Katrinka Blazing;  severe coronary artery disease   CATARACT EXTRACTION W/ INTRAOCULAR LENS IMPLANT Bilateral    2015/ 2019   CORONARY ANGIOPLASTY  1993   @[facility]  by dr h. Katrinka Blazing;   PTCA to total occluded RCA   CORONARY ARTERY BYPASS GRAFT  03/31/2006   @[facility]  by dr gerhardt;   x4--  LIMA--LAD/  rSVG--RI/   rSVG seq -- dRCA and PLB of RCA   INSERTION PROSTATE RADIATION SEED  02/08/2009   @[facility]  by dr Vonita Moss;   radioactive prostate seed implants for prostate cancer   LUMBAR SPINE SURGERY  10/02/2015   dr Dutch Quint;   L3--L5   ROBOTIC ASSITED PARTIAL NEPHRECTOMY Left 08/31/2013   Procedure: ROBOTIC ASSITED PARTIAL NEPHRECTOMY;  Surgeon: Crecencio Mc, MD;  Location: [facility] ORS;  Service: Urology;   Laterality: Left;   TEE WITHOUT CARDIOVERSION N/A 06/22/2017   Procedure: TRANSESOPHAGEAL ECHOCARDIOGRAM (TEE);  Surgeon: Tonny Bollman, MD;  [facility] OR;  Service: Open Heart Surgery;  Laterality: N/A;   TRANSCATHETER AORTIC VALVE REPLACEMENT, TRANSFEMORAL N/A 06/22/2017   Procedure: TRANSCATHETER AORTIC VALVE REPLACEMENT, TRANSFEMORAL;  Surgeon: Tonny Bollman, MD;  Location: [facility] OR;  Service: Open Heart Surgery;  Laterality: N/A;   VEIN LIGATION AND STRIPPING Right 1972   right lower leg    Home Medications:    Allergies:  Allergies  Allergen Reactions   Fish Allergy Swelling    Dark fish- salmon, tuna    Lipitor [Atorvastatin] Other (See Comments)    Muscle pain   Penicillins Swelling    SWELLING OF THE FEET  Has patient had a PCN reaction causing immediate rash, facial/tongue/throat swelling, SOB or lightheadedness with hypotension: no Has patient had a PCN reaction causing severe rash involving mucus membranes or skin necrosis: no Has patient had a PCN reaction that required hospitalization no Has patient had a PCN reaction occurring within the last 10 years: no If all of the above answers are "NO", then may proceed with Cephalosporin use.   Simvastatin Other (See Comments)    MUSCLE PAIN   Adacel [Tetanus-Diphth-Acell Pertussis]     UNSPECIFIED REACTION    Lasix [Furosemide] Swelling    SWELLING REACTION UNSPECIFIED    Septra [Sulfamethoxazole-Trimethoprim]     UNSPECIFIED REACTION    Flexeril [Cyclobenzaprine] Other (See Comments)    Causes confusion   Pantoprazole Diarrhea    Family History  Problem Relation Age of Onset   Heart Problems Mother    Heart Problems Father    Neuropathy Neg Hx     Social History:  reports that he quit smoking about 42 years ago. His smoking use included cigarettes. He has a 90.00 pack-year smoking history. He has never used smokeless tobacco. He reports that he does not drink alcohol and does not use drugs.  ROS: A  complete review of systems was performed.  All systems are negative except for pertinent findings as noted.  Physical Exam:  Vital signs in last 24 hours: BP (!) 153/75   Pulse 73   Temp 97.7 F (36.5 C) (Oral)   Resp 14   Ht 5' 6.5" (1.689 m)   Wt 79 kg   SpO2 97%   BMI 27.68 kg/m  Constitutional:  Alert and oriented, No acute distress Cardiovascular: Regular rate  Respiratory: Normal respiratory effort Neurologic: Grossly intact, no focal deficits Psychiatric: Normal mood and affect  I have reviewed prior pt note  I have reviewed urinalysis results  I have independently reviewed prior imaging  I have reviewed prior PSA results  I  have reviewed prior urine culture   Impression/Assessment:  Bladder tumors  Plan:  TUR BT, gemcitabine placement

## 2023-05-10 NOTE — Anesthesia Procedure Notes (Signed)
Procedure Name: Intubation Date/Time: 05/10/2023 9:58 AM  Performed by: Jasline Buskirk D, CRNAPre-anesthesia Checklist: Patient identified, Emergency Drugs available, Suction available and Patient being monitored Patient Re-evaluated:Patient Re-evaluated prior to induction Oxygen Delivery Method: Circle system utilized Preoxygenation: Pre-oxygenation with 100% oxygen Induction Type: IV induction Ventilation: Mask ventilation without difficulty Laryngoscope Size: Mac and 4 Grade View: Grade I Tube type: Oral Tube size: 7.0 mm Number of attempts: 1 Airway Equipment and Method: Stylet and Oral airway Placement Confirmation: ETT inserted through vocal cords under direct vision, positive ETCO2 and breath sounds checked- equal and bilateral Secured at: 22 cm Tube secured with: Tape Dental Injury: Teeth and Oropharynx as per pre-operative assessment

## 2023-05-10 NOTE — Anesthesia Preprocedure Evaluation (Signed)
Anesthesia Evaluation  Patient identified by MRN, date of birth, ID band Patient awake    Reviewed: Allergy & Precautions, NPO status , Patient's Chart, lab work & pertinent test results  Airway Mallampati: II   Neck ROM: Full    Dental no notable dental hx. (+) Partial Upper, Partial Lower, Dental Advisory Given   Pulmonary shortness of breath, former smoker   breath sounds clear to auscultation       Cardiovascular hypertension, Pt. on medications + CAD, + Past MI, + Peripheral Vascular Disease and +CHF  + Valvular Problems/Murmurs AS  Rhythm:Regular Rate:Normal + Systolic murmurs    Neuro/Psych    GI/Hepatic ,GERD  ,,  Endo/Other  diabetes, Type 2    Renal/GU Renal InsufficiencyRenal disease     Musculoskeletal  (+) Arthritis , Osteoarthritis,    Abdominal   Peds  Hematology  (+) Blood dyscrasia, anemia   Anesthesia Other Findings   Reproductive/Obstetrics                             Anesthesia Physical Anesthesia Plan  ASA: 3  Anesthesia Plan: General   Post-op Pain Management: Minimal or no pain anticipated   Induction: Intravenous  PONV Risk Score and Plan: 2 and Ondansetron, Midazolam and Dexamethasone  Airway Management Planned: LMA  Additional Equipment:   Intra-op Plan:   Post-operative Plan: Extubation in OR  Informed Consent: I have reviewed the patients History and Physical, chart, labs and discussed the procedure including the risks, benefits and alternatives for the proposed anesthesia with the patient or authorized representative who has indicated his/her understanding and acceptance.     Dental advisory given  Plan Discussed with: CRNA  Anesthesia Plan Comments:         Anesthesia Quick Evaluation

## 2023-05-10 NOTE — Op Note (Signed)
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  Urologic history is significant for both adenocarcinoma the prostate, treated with radiotherapy as well as renal cell carcinoma, status post nephrectomy.  Recent cystoscopy revealed the patient to have papillary lesions in the posterior wall of his bladder.  I have discussed transurethral resection of these as well as placement of intravesical gemcitabine postoperatively to limit the risk of recurrence.  He understands the procedure, expected outcomes, as well is the need for temporary catheter placement.  He understands and desires to proceed.  Findings: Urethra was normal.  There were no lesions or strictures.  Prostate was minimally obstructive.  Bladder was inspected circumferentially.  Ureteral orifices were normal in their location and configuration.  Papillary lesions were present in a carpet like manner in the posterior wall of the bladder in the midline.  The largest of these papillary lesions was approximately 10 mm in size, but there were 2 areas involved with carpeted papillary lesions, the biggest was approximately 3 cm in size.  Description of procedure: Patient properly identified in the holding area.  He received preoperative IV antibiotics.  Taken to the operating room where general endotracheal anesthetic was administered.  He was placed in the dorsolithotomy position.  Genitalia and perineum were prepped, draped, proper timeout performed.  26 French resectoscope sheath placed using  the visual obturator.  Circumferential inspection of the bladder was then performed.  I then placed the resectoscope and the bipolar loop.  The above-mentioned lesions were resected into the muscular layer.  Bladder wall was somewhat thin.  Following resection, bladder tumor fragments were collected and sent for pathology labeled "bladder tumor".  Careful inspection of the biopsy sites was performed.  Cautery was used to achieve hemostasis.  With the irrigation turned off, inspection was performed.  No bleeding was seen.  At this point I removed the scope.  The bladder had been drained.  I placed an 31 French Foley catheter, balloon filled with 10 cc of water and hooked to dependent drainage.  The patient was then awakened, extubated and taken to the PACU in stable condition, having tolerated the procedure well.  In the PACU, 2 g of gemcitabine and diluent was instilled in the bladder and left for 1 hour and then drained.

## 2023-05-10 NOTE — Anesthesia Postprocedure Evaluation (Signed)
Anesthesia Post Note  Patient: [name]  Procedure(s) Performed: TRANSURETHRAL RESECTION OF BLADDER TUMOR (TURBT) with GEMCITABINE (Bladder)     Patient location during evaluation: PACU Anesthesia Type: General Level of consciousness: awake and alert Pain management: pain level controlled Vital Signs Assessment: post-procedure vital signs reviewed and stable Respiratory status: spontaneous breathing, nonlabored ventilation and respiratory function stable Cardiovascular status: blood pressure returned to baseline and stable Postop Assessment: no apparent nausea or vomiting Anesthetic complications: no   No notable events documented.  Last Vitals:  Vitals:   05/10/23 1200 05/10/23 1205  BP: (!) 172/90   Pulse: 76 66  Resp: 18 20  Temp:    SpO2: 94% 97%    Last Pain:  Vitals:   05/10/23 1215  TempSrc:   PainSc: 3                  Lowella Curb

## 2023-05-10 NOTE — Transfer of Care (Signed)
Immediate Anesthesia Transfer of Care Note  Patient: [name]  Procedure(s) Performed: TRANSURETHRAL RESECTION OF BLADDER TUMOR (TURBT) with GEMCITABINE (Bladder)  Patient Location: PACU  Anesthesia Type:General  Level of Consciousness: awake, alert , and oriented  Airway & Oxygen Therapy: Patient Spontanous Breathing and Patient connected to nasal cannula oxygen  Post-op Assessment: Report given to RN and Post -op Vital signs reviewed and stable  Post vital signs: Reviewed and stable  Last Vitals:  Vitals Value Taken Time  BP    Temp    Pulse 72 05/10/23 1039  Resp 12 05/10/23 1039  SpO2 99 % 05/10/23 1039  Vitals shown include unvalidated device data.  Last Pain:  Vitals:   05/10/23 0822  TempSrc: Oral  PainSc: 1       Patients Stated Pain Goal: 5 (05/10/23 1610)  Complications: No notable events documented.

## 2023-05-10 NOTE — Discharge Instructions (Addendum)
You may see some blood in the urine and may have some burning with urination for 48-72 hours. You also may notice that you have to urinate more frequently or urgently after your procedure which is normal.  You should call should you develop an inability urinate, fever > 101, persistent nausea and vomiting that prevents you from eating or drinking to stay hydrated. If you have a catheter, you will be taught how to take care of the catheter by the nursing staff prior to discharge from the hospital.  You may periodically feel a strong urge to void with the catheter in place.  This is a bladder spasm and most often can occur when having a bowel movement or moving around. It is typically self-limited and usually will stop after a few minutes.  You may use some Vaseline or Neosporin around the tip of the catheter to reduce friction at the tip of the penis. You may also see some blood in the urine.  A very small amount of blood can make the urine look quite red.  As long as the catheter is draining well, there usually is not a problem.  However, if the catheter is not draining well and is bloody, you should call the office [phone]) to notify us.  It is okay to remove the catheter as instructed by the nurses on Tuesday morning. It is okay to resume aspirin and indomethacin once the urine has turned yellow.   Post Anesthesia Home Care Instructions  Activity: Get plenty of rest for the remainder of the day. A responsible individual must stay with you for 24 hours following the procedure.  For the next 24 hours, DO NOT: -Drive a car -Advertising copywriter -Drink alcoholic beverages -Take any medication unless instructed by your physician -Make any legal decisions or sign important papers.  Meals: Start with liquid foods such as gelatin or soup. Progress to regular foods as tolerated. Avoid greasy, spicy, heavy foods. If nausea and/or vomiting occur, drink only clear liquids until the nausea and/or vomiting  subsides. Call your physician if vomiting continues.  Special Instructions/Symptoms: Your throat may feel dry or sore from the anesthesia or the breathing tube placed in your throat during surgery. If this causes discomfort, gargle with warm salt water. The discomfort should disappear within 24 hours.

## 2023-05-11 LAB — SURGICAL PATHOLOGY

## 2023-05-27 ENCOUNTER — Emergency Department (HOSPITAL_COMMUNITY)
Admission: EM | Admit: 2023-05-27 | Discharge: 2023-05-27 | Disposition: A | Discharge status: Home / Self Care | Payer: PPO | Attending: Emergency Medicine | Admitting: Emergency Medicine

## 2023-05-27 ENCOUNTER — Encounter (HOSPITAL_COMMUNITY): Payer: Self-pay

## 2023-05-27 ENCOUNTER — Other Ambulatory Visit: Payer: Self-pay

## 2023-05-27 ENCOUNTER — Emergency Department (HOSPITAL_COMMUNITY): Payer: PPO

## 2023-05-27 DIAGNOSIS — R531 Weakness: Secondary | ICD-10-CM | POA: Insufficient documentation

## 2023-05-27 DIAGNOSIS — R112 Nausea with vomiting, unspecified: Secondary | ICD-10-CM | POA: Diagnosis present

## 2023-05-27 DIAGNOSIS — E860 Dehydration: Secondary | ICD-10-CM | POA: Insufficient documentation

## 2023-05-27 DIAGNOSIS — R509 Fever, unspecified: Secondary | ICD-10-CM | POA: Insufficient documentation

## 2023-05-27 DIAGNOSIS — Z20822 Contact with and (suspected) exposure to covid-19: Secondary | ICD-10-CM | POA: Diagnosis not present

## 2023-05-27 DIAGNOSIS — N390 Urinary tract infection, site not specified: Secondary | ICD-10-CM

## 2023-05-27 DIAGNOSIS — N1832 Chronic kidney disease, stage 3b: Secondary | ICD-10-CM | POA: Insufficient documentation

## 2023-05-27 LAB — CBC WITH DIFFERENTIAL/PLATELET
Abs Immature Granulocytes: 0.02 10*3/uL (ref 0.00–0.07)
Basophils Absolute: 0 10*3/uL (ref 0.0–0.1)
Basophils Relative: 1 %
Eosinophils Absolute: 0 10*3/uL (ref 0.0–0.5)
Eosinophils Relative: 0 %
HCT: 34.2 % — ABNORMAL LOW (ref 39.0–52.0)
Hemoglobin: 11 g/dL — ABNORMAL LOW (ref 13.0–17.0)
Immature Granulocytes: 0 %
Lymphocytes Relative: 10 %
Lymphs Abs: 0.8 10*3/uL (ref 0.7–4.0)
MCH: 32.7 pg (ref 26.0–34.0)
MCHC: 32.2 g/dL (ref 30.0–36.0)
MCV: 101.8 fL — ABNORMAL HIGH (ref 80.0–100.0)
Monocytes Absolute: 0.8 10*3/uL (ref 0.1–1.0)
Monocytes Relative: 9 %
Neutro Abs: 6.6 10*3/uL (ref 1.7–7.7)
Neutrophils Relative %: 80 %
Platelets: 261 10*3/uL (ref 150–400)
RBC: 3.36 MIL/uL — ABNORMAL LOW (ref 4.22–5.81)
RDW: 14.1 % (ref 11.5–15.5)
WBC: 8.2 10*3/uL (ref 4.0–10.5)
nRBC: 0 % (ref 0.0–0.2)

## 2023-05-27 LAB — URINALYSIS, ROUTINE W REFLEX MICROSCOPIC
Bilirubin Urine: NEGATIVE
Glucose, UA: NEGATIVE mg/dL
Ketones, ur: NEGATIVE mg/dL
Nitrite: POSITIVE — AB
Protein, ur: 30 mg/dL — AB
Specific Gravity, Urine: 1.014 (ref 1.005–1.030)
WBC, UA: >50 WBC/hpf (ref 0–5)
pH: 5 (ref 5.0–8.0)

## 2023-05-27 LAB — COMPREHENSIVE METABOLIC PANEL
ALT: 12 U/L (ref 0–44)
AST: 17 U/L (ref 15–41)
Albumin: 2.9 g/dL — ABNORMAL LOW (ref 3.5–5.0)
Alkaline Phosphatase: 35 U/L — ABNORMAL LOW (ref 38–126)
Anion gap: 8 (ref 5–15)
BUN: 31 mg/dL — ABNORMAL HIGH (ref 8–23)
CO2: 23 mmol/L (ref 22–32)
Calcium: 8.2 mg/dL — ABNORMAL LOW (ref 8.9–10.3)
Chloride: 106 mmol/L (ref 98–111)
Creatinine, Ser: 1.69 mg/dL — ABNORMAL HIGH (ref 0.61–1.24)
GFR, Estimated: 39 mL/min — ABNORMAL LOW (ref >60)
Glucose, Bld: 217 mg/dL — ABNORMAL HIGH (ref 70–99)
Potassium: 4.2 mmol/L (ref 3.5–5.1)
Sodium: 137 mmol/L (ref 135–145)
Total Bilirubin: 0.6 mg/dL (ref 0.3–1.2)
Total Protein: 6.2 g/dL — ABNORMAL LOW (ref 6.5–8.1)

## 2023-05-27 LAB — RESP PANEL BY RT-PCR (RSV, FLU A&B, COVID)  RVPGX2
Influenza A by PCR: NEGATIVE
Influenza B by PCR: NEGATIVE
Resp Syncytial Virus by PCR: NEGATIVE
SARS Coronavirus 2 by RT PCR: NEGATIVE

## 2023-05-27 LAB — LACTIC ACID, PLASMA: Lactic Acid, Venous: 1.2 mmol/L (ref 0.5–1.9)

## 2023-05-27 MED ORDER — SODIUM CHLORIDE 0.9 % IV SOLN
1.0000 g | Freq: Once | INTRAVENOUS | Status: AC
  Administered 2023-05-27: 1 g via INTRAVENOUS
  Filled 2023-05-27: qty 10

## 2023-05-27 MED ORDER — LACTATED RINGERS IV BOLUS
1000.0000 mL | Freq: Once | INTRAVENOUS | Status: AC
  Administered 2023-05-27: 1000 mL via INTRAVENOUS

## 2023-05-27 MED ORDER — CEFDINIR 300 MG PO CAPS: 300.0000 mg | ORAL_CAPSULE | Freq: Two times a day (BID) | ORAL | 0 refills | Status: AC

## 2023-05-27 MED ORDER — LACTATED RINGERS IV BOLUS
500.0000 mL | Freq: Once | INTRAVENOUS | Status: AC
  Administered 2023-05-27: 500 mL via INTRAVENOUS

## 2023-05-27 NOTE — ED Provider Notes (Signed)
[place] [facility] Provider Note   CSN: [id number] Arrival date & time: 05/27/23  1334     History  Chief Complaint  Patient presents with   Nausea   Weakness    [name] is a 89 y.o. male.  Patient with recent resection bladder cancer 2-3 weeks ago, with nausea/vomiting, general weakness and low grade fever. Emesis not bloody or bilious. No abd pain or distension. Had bm yesterday, no diarrhea. No dysuria, indicates is making urine normally - indicates had foley removed a few days ago. No skin lesions, redness or rash. No spine or joint pain or swelling. No headache. No sore throat, cough or uri symptoms. No chest pain or sob.  EMS noted temp 100.6.  The history is provided by the patient, medical records and the EMS personnel.       Home Medications Prior to Admission medications   Medication Sig Start Date End Date Taking? Authorizing Provider  Ca Carbonate-Mag Hydroxide (ROLAIDS PO) Take by mouth as needed (reflux).    [provider]  capsicum (ZOSTRIX) 0.075 % topical cream Apply 1 Application topically 2 (two) times daily.    [provider]  Cyanocobalamin (VITAMIN B12) 1000 MCG TBCR Take 1,000 mcg by mouth daily. 10/25/20   [provider]  glipiZIDE (GLUCOTROL) 10 MG tablet Take by mouth 2 (two) times daily before a meal. Take 1 tablet by mouth every morning and 1/2 tablet by mouth every evening    [provider]  HYDROcodone-acetaminophen (NORCO) 10-325 MG tablet Take 1 tablet by mouth every 4 (four) hours. Per pt on regular basis for chronic pain for severe back pain    [provider]  lidocaine (LIDODERM) 5 % Place 1 patch onto the skin as needed. Remove & Discard patch within 12 hours or as directed by MD    [provider]  Multiple Vitamin (MULTIVITAMIN) tablet Take 1 tablet by mouth at bedtime.    [provider]  nitroGLYCERIN (NITROSTAT) 0.4 MG SL tablet  Place 1 tablet (0.4 mg total) under the tongue every 5 (five) minutes as needed for chest pain. 10/02/22   Lyn Records, MD  OVER THE COUNTER MEDICATION Apply topically 4 (four) times daily as needed. Neuro Soothe Cream for nerve pain , hands/ feet    [provider]  Psyllium (METAMUCIL) 48.57 % POWD Take by mouth as needed.    [provider]  rosuvastatin (CRESTOR) 5 MG tablet Take 5 mg by mouth every Monday, Wednesday, and Friday.    [provider]  tamsulosin (FLOMAX) 0.4 MG CAPS capsule Take 0.4 mg by mouth 2 (two) times daily.    [provider]  torsemide (DEMADEX) 20 MG tablet TAKE 1 TABLET BY MOUTH EVERY MONDAY, WEDNESDAY, AND FRIDAY Patient taking differently: Take 20 mg by mouth daily as needed. 11/30/22   Lyn Records, MD      Allergies    Fish allergy, Lipitor [atorvastatin], Penicillins, Simvastatin, Adacel [tetanus-diphth-acell pertussis], Lasix [furosemide], Septra [sulfamethoxazole-trimethoprim], Flexeril [cyclobenzaprine], and Pantoprazole    Review of Systems   Review of Systems  Constitutional:  Positive for fever.  HENT:  Negative for sinus pain and sore throat.   Eyes:  Negative for redness.  Respiratory:  Negative for cough and shortness of breath.   Cardiovascular:  Negative for chest pain and leg swelling.  Gastrointestinal:  Positive for nausea and vomiting. Negative for abdominal pain and diarrhea.  Genitourinary:  Negative for  dysuria and flank pain.  Musculoskeletal:  Negative for back pain, neck pain and neck stiffness.  Skin:  Negative for rash.  Neurological:  Negative for headaches.  Hematological:  Does not bruise/bleed easily.  Psychiatric/Behavioral:  Negative for confusion.     Physical Exam Updated Vital Signs BP (!) 120/58   Pulse 82   Temp 99.8 F (37.7 C) (Oral)   Resp 20   Ht 1.676 m (5\' 6" )   Wt 76.2 kg   SpO2 96%   BMI 27.12 kg/m  Physical Exam Vitals and nursing note reviewed.   Constitutional:      Appearance: Normal appearance. He is well-developed.  HENT:     Head: Atraumatic.     Nose: Nose normal.     Mouth/Throat:     Mouth: Mucous membranes are moist.     Pharynx: Oropharynx is clear. No oropharyngeal exudate or posterior oropharyngeal erythema.  Eyes:     General: No scleral icterus.    Conjunctiva/sclera: Conjunctivae normal.     Pupils: Pupils are equal, round, and reactive to light.  Neck:     Vascular: No carotid bruit.     Trachea: No tracheal deviation.     Comments: No stiffness or rigidity.  Cardiovascular:     Rate and Rhythm: Normal rate and regular rhythm.     Pulses: Normal pulses.     Heart sounds: Normal heart sounds. No murmur heard.    No friction rub. No gallop.  Pulmonary:     Effort: Pulmonary effort is normal. No accessory muscle usage or respiratory distress.     Breath sounds: Normal breath sounds.  Abdominal:     General: Bowel sounds are normal. There is no distension.     Palpations: Abdomen is soft. There is no mass.     Tenderness: There is no abdominal tenderness. There is no guarding.  Genitourinary:    Comments: No cva tenderness. No scrotal or testicular pain, swelling, or tenderness.  Musculoskeletal:        General: No swelling or tenderness.     Cervical back: Normal range of motion and neck supple. No rigidity or tenderness.     Right lower leg: No edema.     Left lower leg: No edema.  Lymphadenopathy:     Cervical: No cervical adenopathy.  Skin:    General: Skin is warm and dry.     Findings: No rash.  Neurological:     Mental Status: He is alert.     Comments: Alert, speech clear. Motor/sens grossly intact bil.   Psychiatric:        Mood and Affect: Mood normal.     ED Results / Procedures / Treatments   Labs (all labs ordered are listed, but only abnormal results are displayed) Results for orders placed or performed during the hospital encounter of 05/27/23  Lactic acid, plasma  Result Value  Ref Range   Lactic Acid, Venous 1.2 0.5 - 1.9 mmol/L  Comprehensive metabolic panel  Result Value Ref Range   Sodium 137 135 - 145 mmol/L   Potassium 4.2 3.5 - 5.1 mmol/L   Chloride 106 98 - 111 mmol/L   CO2 23 22 - 32 mmol/L   Glucose, Bld 217 (H) 70 - 99 mg/dL   BUN 31 (H) 8 - 23 mg/dL   Creatinine, Ser 1.61 (H) 0.61 - 1.24 mg/dL   Calcium 8.2 (L) 8.9 - 10.3 mg/dL   Total Protein 6.2 (L) 6.5 - 8.1 g/dL  Albumin 2.9 (L) 3.5 - 5.0 g/dL   AST 17 15 - 41 U/L   ALT 12 0 - 44 U/L   Alkaline Phosphatase 35 (L) 38 - 126 U/L   Total Bilirubin 0.6 0.3 - 1.2 mg/dL   GFR, Estimated 39 (L) >60 mL/min   Anion gap 8 5 - 15  CBC with Differential  Result Value Ref Range   WBC 8.2 4.0 - 10.5 K/uL   RBC 3.36 (L) 4.22 - 5.81 MIL/uL   Hemoglobin 11.0 (L) 13.0 - 17.0 g/dL   HCT 16.1 (L) 09.6 - 04.5 %   MCV 101.8 (H) 80.0 - 100.0 fL   MCH 32.7 26.0 - 34.0 pg   MCHC 32.2 30.0 - 36.0 g/dL   RDW 40.9 81.1 - 91.4 %   Platelets 261 150 - 400 K/uL   nRBC 0.0 0.0 - 0.2 %   Neutrophils Relative % 80 %   Neutro Abs 6.6 1.7 - 7.7 K/uL   Lymphocytes Relative 10 %   Lymphs Abs 0.8 0.7 - 4.0 K/uL   Monocytes Relative 9 %   Monocytes Absolute 0.8 0.1 - 1.0 K/uL   Eosinophils Relative 0 %   Eosinophils Absolute 0.0 0.0 - 0.5 K/uL   Basophils Relative 1 %   Basophils Absolute 0.0 0.0 - 0.1 K/uL   Immature Granulocytes 0 %   Abs Immature Granulocytes 0.02 0.00 - 0.07 K/uL     EKG EKG Interpretation  Date/Time:  Thursday May 27 2023 13:48:15 EDT Ventricular Rate:  99 PR Interval:  156 QRS Duration: 147 QT Interval:  361 QTC Calculation: 464 R Axis:   -50 Text Interpretation: Sinus rhythm Left bundle branch block Baseline wander Confirmed by Cathren Laine ([id number]) on 05/27/2023 2:56:31 PM  Radiology No results found.  Procedures Procedures    Medications Ordered in ED Medications  lactated ringers bolus 1,000 mL (1,000 mLs Intravenous New Bag/Given 05/27/23 1431)    ED Course/ Medical  Decision Making/ A&P                             Medical Decision Making Problems Addressed: Acute febrile illness: acute illness or injury with systemic symptoms that poses a threat to life or bodily functions Dehydration: acute illness or injury with systemic symptoms Generalized weakness: acute illness or injury with systemic symptoms that poses a threat to life or bodily functions Nausea and vomiting in adult: acute illness or injury with systemic symptoms Stage 3b chronic kidney disease (HCC): chronic illness or injury that poses a threat to life or bodily functions  Amount and/or Complexity of Data Reviewed Independent Historian: EMS    Details: hx External Data Reviewed: notes. Labs: ordered. Decision-making details documented in ED Course. Radiology: ordered and independent interpretation performed. Decision-making details documented in ED Course. ECG/medicine tests: ordered and independent interpretation performed. Decision-making details documented in ED Course.  Risk Prescription drug management. Decision regarding hospitalization.   Iv ns. Continuous pulse ox and cardiac monitoring. Labs ordered/sent. Imaging ordered.   Differential diagnosis includes acute febrile illness, sepsis, uti, etc. Dispo decision including potential need for admission considered - will get labs and imaging and reassess.   Reviewed nursing notes and prior charts for additional history. External reports reviewed. Additional history from: EMS.  Cultures sent. Ivf bolus.   Cardiac monitor: sinus rhythm, rate 98.  Labs reviewed/interpreted by me - wbc normal. Lactate normal. UA pending.   Xrays reviewed/interpreted by me -  pnd.   1520, labs, UA, cxr pending - signed out to Dr Suezanne Jacquet to check pending studies, recheck, and dispo appropriately.            Final Clinical Impression(s) / ED Diagnoses Final diagnoses:  None    Rx / DC Orders ED Discharge Orders     None          Cathren Laine, MD 05/27/23 1523

## 2023-05-27 NOTE — ED Triage Notes (Signed)
Per EMS  Bladder CA Procedure last week Back Pain Fever 100.6 650mg  tylenol Nausea Given 4mg  Zofran by EMS Started today Feels better after IVF and zofran Frequent urination  of IVF BP 100/50 O2 92% RA-Placed on 2L of O2 20L AC

## 2023-05-27 NOTE — ED Provider Notes (Signed)
  Physical Exam  BP (!) 120/58   Pulse 82   Temp 99.8 F (37.7 C) (Oral)   Resp 20   Ht 5\' 6"  (1.676 m)   Wt 76.2 kg   SpO2 96%   BMI 27.12 kg/m   Physical Exam Vitals and nursing note reviewed.  Constitutional:      General: He is not in acute distress.    Appearance: Normal appearance.  HENT:     Head: Normocephalic and atraumatic.     Mouth/Throat:     Mouth: Mucous membranes are moist.  Eyes:     Conjunctiva/sclera: Conjunctivae normal.  Cardiovascular:     Rate and Rhythm: Normal rate.  Pulmonary:     Effort: Pulmonary effort is normal. No respiratory distress.  Abdominal:     General: Abdomen is flat.     Tenderness: There is no abdominal tenderness. There is no right CVA tenderness or left CVA tenderness.  Skin:    General: Skin is warm and dry.     Capillary Refill: Capillary refill takes less than 2 seconds.  Neurological:     General: No focal deficit present.     Mental Status: He is alert. Mental status is at baseline.  Psychiatric:        Mood and Affect: Mood normal.        Behavior: Behavior normal.     Procedures  Procedures  ED Course / MDM   Clinical Course as of 05/27/23 1711  Thu May 27, 2023  1525 Received sign out from Dr. Denton Lank, presenting with generalized weakness, nausea and vomiting. Pending urinalysis. No abdominal pain. Had recent bladder tumor resection [WS]  1704 Urinalysis concerning for UTI.  Positive for nitrates and leukocytes.  Patient reports he feels much better after IV fluids and denies any abdominal pain, flank pain.  On reassessment he has no CVA tenderness or abdominal tenderness.  He does not currently have a urinary catheter.  He has follow-up with the urologist next week.  He is not having hematuria.  No recent abnormal urine cultures.  Will give dose of ceftriaxone and discharged on cefdinir. Will discharge patient to home. All questions answered. Patient comfortable with plan of discharge. Return precautions discussed  with patient and specified on the after visit summary.  [WS]    Clinical Course User Index [WS] Lonell Grandchild, MD   Medical Decision Making Amount and/or Complexity of Data Reviewed Labs: ordered. Radiology: ordered. ECG/medicine tests: ordered.  Risk Prescription drug management.   Reviewed allergy to PCN. Patient received ancef previously will give CTX       Lonell Grandchild, MD 05/27/23 1711

## 2023-05-27 NOTE — Discharge Instructions (Addendum)
It was our pleasure to provide your ER care today - we hope that you feel better.  Your testing showed that you have a urinary infection.  We have given you a dose of antibiotics in the emergency department.  Please take your next dose starting tomorrow morning.  Drink plenty of fluids/stay well hydrated. Take acetaminophen as need.   Follow up with primary care doctor in the next few days if symptoms fail to improve/resolve.  Return to ER if worse, new symptoms, weak/fainting, new/severe pain, chest pain, trouble breathing, or other concern.

## 2023-05-28 LAB — CULTURE, BLOOD (ROUTINE X 2)

## 2023-05-28 LAB — URINE CULTURE: Culture: >=100000 — AB

## 2023-05-29 LAB — CULTURE, BLOOD (ROUTINE X 2)
Culture: NO GROWTH
Culture: NO GROWTH

## 2023-05-29 LAB — URINE CULTURE

## 2023-05-30 ENCOUNTER — Telehealth (HOSPITAL_BASED_OUTPATIENT_CLINIC_OR_DEPARTMENT_OTHER): Payer: Self-pay | Admitting: *Deleted

## 2023-05-30 LAB — CULTURE, BLOOD (ROUTINE X 2)

## 2023-05-30 NOTE — Progress Notes (Addendum)
ED Antimicrobial Stewardship Positive Culture Follow Up   [name] is an 89 y.o. male who presented to [facility] on 05/27/2023 with a chief complaint of N&V, general weakness, low grade fever.  Recent resection bladder cancer.  EMS reports frequent urination.    Chief Complaint  Patient presents with   Nausea   Weakness    Recent Results (from the past 720 hour(s))  Resp panel by RT-PCR (RSV, Flu A&B, Covid) Anterior Nasal Swab     Status: None   Collection Time: 05/27/23  2:04 PM   Specimen: Anterior Nasal Swab  Result Value Ref Range Status   SARS Coronavirus 2 by RT PCR NEGATIVE NEGATIVE Final    Comment: (NOTE) SARS-CoV-2 target nucleic acids are NOT DETECTED.  The SARS-CoV-2 RNA is generally detectable in upper respiratory specimens during the acute phase of infection. The lowest concentration of SARS-CoV-2 viral copies this assay can detect is 138 copies/mL. A negative result does not preclude SARS-Cov-2 infection and should not be used as the sole basis for treatment or other patient management decisions. A negative result may occur with  improper specimen collection/handling, submission of specimen other than nasopharyngeal swab, presence of viral mutation(s) within the areas targeted by this assay, and inadequate number of viral copies(<138 copies/mL). A negative result must be combined with clinical observations, patient history, and epidemiological information. The expected result is Negative.  Fact Sheet for Patients:  BloggerCourse.com  Fact Sheet for Healthcare Providers:  SeriousBroker.it  This test is no t yet approved or cleared by the [place] FDA and  has been authorized for detection and/or diagnosis of SARS-CoV-2 by FDA under an Emergency Use Authorization (EUA). This EUA will remain  in effect (meaning this test can be used) for the duration of the COVID-19 declaration under Section 564(b)(1) of  the Act, 21 U.S.C.section 360bbb-3(b)(1), unless the authorization is terminated  or revoked sooner.       Influenza A by PCR NEGATIVE NEGATIVE Final   Influenza B by PCR NEGATIVE NEGATIVE Final    Comment: (NOTE) The Xpert Xpress SARS-CoV-2/FLU/RSV plus assay is intended as an aid in the diagnosis of influenza from Nasopharyngeal swab specimens and should not be used as a sole basis for treatment. Nasal washings and aspirates are unacceptable for Xpert Xpress SARS-CoV-2/FLU/RSV testing.  Fact Sheet for Patients: BloggerCourse.com  Fact Sheet for Healthcare Providers: SeriousBroker.it  This test is not yet approved or cleared by the [place] FDA and has been authorized for detection and/or diagnosis of SARS-CoV-2 by FDA under an Emergency Use Authorization (EUA). This EUA will remain in effect (meaning this test can be used) for the duration of the COVID-19 declaration under Section 564(b)(1) of the Act, 21 U.S.C. section 360bbb-3(b)(1), unless the authorization is terminated or revoked.     Resp Syncytial Virus by PCR NEGATIVE NEGATIVE Final    Comment: (NOTE) Fact Sheet for Patients: BloggerCourse.com  Fact Sheet for Healthcare Providers: SeriousBroker.it  This test is not yet approved or cleared by the [place] FDA and has been authorized for detection and/or diagnosis of SARS-CoV-2 by FDA under an Emergency Use Authorization (EUA). This EUA will remain in effect (meaning this test can be used) for the duration of the COVID-19 declaration under Section 564(b)(1) of the Act, 21 U.S.C. section 360bbb-3(b)(1), unless the authorization is terminated or revoked.  Performed at [facility], [address] [place], [place] [address]   Blood Culture (routine x 2)  Status: None (Preliminary result)   Collection Time: 05/27/23  2:12 PM    Specimen: Right Antecubital; Blood  Result Value Ref Range Status   Specimen Description   Final    RIGHT ANTECUBITAL BLOOD Performed at [facility], [address] [place], [place] [address]    Special Requests   Final    BOTTLES DRAWN AEROBIC AND ANAEROBIC Blood Culture results may not be optimal due to an excessive volume of blood received in culture bottles Performed at [facility], [address], [place], [place] [address]    Culture   Final    NO GROWTH 3 DAYS Performed at [facility], [address], [place], [place] [address]    Report Status PENDING  Incomplete  Blood Culture (routine x 2)     Status: None (Preliminary result)   Collection Time: 05/27/23  2:22 PM   Specimen: BLOOD LEFT FOREARM  Result Value Ref Range Status   Specimen Description   Final    BLOOD LEFT FOREARM Performed at [facility], [address], [place], [place] [address]    Special Requests   Final    BOTTLES DRAWN AEROBIC AND ANAEROBIC Blood Culture results may not be optimal due to an excessive volume of blood received in culture bottles Performed at [facility], [address], [place], [place] [address]    Culture   Final    NO GROWTH 3 DAYS Performed at [facility], [address], [place], [place] [address]    Report Status PENDING  Incomplete  Culture, Urine (Do not remove urinary catheter, catheter placed by urology or difficult to place)     Status: Abnormal   Collection Time: 05/27/23  3:29 PM   Specimen: Urine, Catheterized  Result Value Ref Range Status   Specimen Description   Final    URINE, CATHETERIZED Performed at [facility], [address] [place], [place] [address]    Special Requests   Final    NONE Performed at [facility], [address], [place], [place] [address]    Culture >=100,000 COLONIES/mL ENTEROBACTER AEROGENES (A)  Final   Report Status  05/29/2023 FINAL  Final   Organism ID, Bacteria ENTEROBACTER AEROGENES (A)  Final      Susceptibility   Enterobacter aerogenes - MIC*    CEFEPIME <=0.12 SENSITIVE Sensitive     CEFTRIAXONE <=0.25 SENSITIVE Sensitive     CIPROFLOXACIN <=0.25 SENSITIVE Sensitive     GENTAMICIN <=1 SENSITIVE Sensitive     IMIPENEM 1 SENSITIVE Sensitive     NITROFURANTOIN 64 INTERMEDIATE Intermediate     TRIMETH/SULFA <=20 SENSITIVE Sensitive     PIP/TAZO <=4 SENSITIVE Sensitive     * >=100,000 COLONIES/mL ENTEROBACTER AEROGENES    [x]  Treated with Cefdinir, organism resistant to prescribed antimicrobial []  Patient discharged originally without antimicrobial agent and treatment is now indicated  6/6 Urine Culture: Enterobacter Aerogenes- sensitive to Ciprofloxacin  New antibiotic prescription: Ciprofloxacin 500mg  po every 24 hours for 5 days.  ED Provider: Tanda Rockers   Sherie Dobrowolski Tylene Fantasia 05/30/2023, 1:45 PM Clinical Pharmacist Monday - Friday phone -  [phone] Saturday - Sunday phone - [phone]

## 2023-05-30 NOTE — Telephone Encounter (Signed)
Post ED Visit - Positive Culture Follow-up: Successful Patient Follow-Up  Culture assessed and recommendations reviewed by:  []  Enzo Bi, Pharm.D. []  Celedonio Miyamoto, Pharm.D., BCPS AQ-ID []  Garvin Fila, Pharm.D., BCPS []  Georgina Pillion, Pharm.D., BCPS []  [place], [address].D., BCPS, AAHIVP []  Estella Husk, Pharm.D., BCPS, AAHIVP []  Lysle Pearl, PharmD, BCPS []  Phillips Climes, PharmD, BCPS []  Agapito Games, PharmD, BCPS [x]  Tacy Learn, PharmD  Positive urine culture  []  Patient discharged without antimicrobial prescription and treatment is now indicated [x]  Organism is resistant to prescribed ED discharge antimicrobial []  Patient with positive blood cultures  Changes discussed with ED provider: Tanda Rockers, DO New antibiotic prescription Cipro 500mg  q24hrs x 5 days Stop Cefdinir Called to [facility]  Contacted patient, date 05/30/23, time 1427   [name] 05/30/2023, 2:27 PM

## 2023-06-01 LAB — CULTURE, BLOOD (ROUTINE X 2)

## 2023-07-11 NOTE — Progress Notes (Unsigned)
Cardiology Office Note:  .    Date:  07/12/2023  ID:  JAWAAN ADACHI, DOB 10-27-1934, MRN [id number] PCP: Emilio Aspen, MD  [facility] Providers Cardiologist:  Christell Constant, MD Advanced Heart Failure:  Marca Ancona, MD     CC: Transition to new cardiologist  History of Present Illness: .    FITZPATRICK ALBERICO is a 89 y.o. 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  Patient notes that he is doing ok.   He had a bladder procedure done and has had some fevers after this and is on antibiotic.  Notes DOE with exertion on some days.This is related to severe neuropathy.  He had a failed back surgery in 2016. No chest pain or pressure .  No SOB and no PND/Orthopnea.  No weight gain or leg swelling.  No palpitations or syncope .  Relevant histories: .  2021: LVEF recovered no long following with Dr. Shirlee Latch Social- saw Dr. Katrinka Blazing, former use car salesman and owner ROS: As per HPI.   Studies Reviewed: .   Cardiac Studies & Procedures   CARDIAC CATHETERIZATION  CARDIAC CATHETERIZATION 11/03/2016  Narrative  Severe native vessel coronary disease with total occlusion of the left main and native right coronary.  Bypass graft failure with occlusion of the saphenous vein graft to the ramus intermedius.  Widely patent saphenous vein graft to the PDA and PL branch.  Widely patent LIMA to the LAD  Normal pulmonary artery pressures including capillary wedge.  Findings Coronary Findings Diagnostic  Dominance: Right  Left Main  Left Anterior Descending  First Diagonal Branch  Second Diagonal Branch Vessel is small in size.  Third Diagonal Branch Vessel is small in size.  Right Coronary Artery  Right Posterior Descending Artery Vessel is small in size.  First Right Posterolateral Branch Vessel is small in size.  LIMA LIMA Graft  To Dist LAD LIMA.  Graft To RPAV and is very large.  Saphenous Graft To Ramus SVG.  Intervention  No interventions have been documented.   STRESS TESTS  ECHOCARDIOGRAM STRESS TEST 04/26/2017  Narrative *[facility]* [address] [place], [place] [address] [phone]  ------------------------------------------------------------------- Stress Echocardiography  Patient:    Netanel, Yannuzzi MR #:       [id number] Study Date: 04/26/2017 Gender:     M Age:        82 Height:     167.6 cm Weight:     89.4 kg BSA:        2.07 m^2 Pt. Status: Room:  ATTENDING    Lyn Records, MD Duanne Moron     Lyn Records, MD REFERRING    Lyn Records, MD SONOGRAPHER  Aida Raider, RDCS PERFORMING   [facility]  cc:  ------------------------------------------------------------------- LV EF: 20% -   25%  ------------------------------------------------------------------- Indications:      I35.9 Aortic Valve Disorder.  ASSESS FOR LOW GRADIENT AORTIC STENOSIS.  ------------------------------------------------------------------- History:   PMH:  CAD. Myocardial Infarction.  Risk factors: Hypertension. Diabetes mellitus. Dyslipidemia.  ------------------------------------------------------------------- Study Conclusions  - Left ventricle: The cavity size was mildly dilated. There was mild concentric hypertrophy. Systolic function was severely reduced. The estimated ejection fraction was in the range of 20% to 25%. Diffuse hypokinesis. - Aortic valve: Valve mobility was restricted. There was severe stenosis. There was trivial regurgitation. - Left atrium: The  atrium was mildly dilated. - Stress ECG conclusions: The stress ECG was normal.  Impressions:  - LV function severely depressed at baseline which improved with dobutamine; mean gradient 25 mmHg at baseline and increased to 41 mmHg with dobutamine; AVA 0.8 cm 2 at baseline and with peak infusion;  dimensionless index 0.2 at baseline and peak infusion; findings are consistent with severe AS.  ------------------------------------------------------------------- Study data:   Study status:  Routine.  Consent:  The risks, benefits, and alternatives to the procedure were explained to the patient and informed consent was obtained.  Procedure:  The patient reported no pain pre or post test. Initial setup. The patient was brought to the laboratory. A baseline ECG was recorded. Surface ECG leads and automatic cuff blood pressure measurements were monitored.  Dobutamine stress test. Stress testing was performed, with dobutamine infusion from 5 to 20 mcg/kg/min by 5 mcg/kg/min increments. The infusion was terminated due to maximal dose administration. Transthoracic stress echocardiography for left ventricular function evaluation and assessment of valvular function. Images were captured at baseline, low dose, peak dose, and recovery.  Study completion:  The patient tolerated the procedure well. There were no complications.          Dobutamine. Stress echocardiography.  2D.  Birthdate:  Patient birthdate: 1934-03-21.  Age:  Patient is 89 yr old.  Sex:  Gender: male. BMI: 31.8 kg/m^2.  Blood pressure:     112/66  Patient status: Outpatient.  Study date:  Study date: 04/26/2017. Study time: 02:24 PM.  -------------------------------------------------------------------  ------------------------------------------------------------------- Left ventricle:  The cavity size was mildly dilated. There was mild concentric hypertrophy. Systolic function was severely reduced. The estimated ejection fraction was in the range of 20% to 25%. Diffuse hypokinesis.  ------------------------------------------------------------------- Aortic valve:   Trileaflet; severely calcified leaflets. Valve mobility was restricted.  Doppler:   There was severe stenosis. There was trivial regurgitation.    VTI ratio of  LVOT to aortic valve: 0.26. Valve area (VTI): 0.99 cm^2. Indexed valve area (VTI): 0.48 cm^2/m^2. Peak velocity ratio of LVOT to aortic valve: 0.22. Valve area (Vmax): 0.85 cm^2. Indexed valve area (Vmax): 0.41 cm^2/m^2. Mean velocity ratio of LVOT to aortic valve: 0.22. Valve area (Vmean): 0.85 cm^2. Indexed valve area (Vmean): 0.41 cm^2/m^2. Mean gradient (S): 28 mm Hg. Peak gradient (S): 51 mm Hg.  ------------------------------------------------------------------- Left atrium:  The atrium was mildly dilated.  ------------------------------------------------------------------- Right ventricle:  The cavity size was normal. Systolic function was normal.  ------------------------------------------------------------------- Right atrium:  The atrium was normal in size.  ------------------------------------------------------------------- Baseline ECG:  Normal sinus rhythm, septal MI, nonspecific ST changes.  ------------------------------------------------------------------- Stress protocol:  +-----------------------+---+------------+--------+ !Stage                  !HR !BP (mmHg)   !Symptoms! +-----------------------+---+------------+--------+ !Baseline               !85 !112/66 (81) !None    ! +-----------------------+---+------------+--------+ !Dobutamine 5 ug/kg/min !82 !137/70 (92) !None    ! +-----------------------+---+------------+--------+ !Dobutamine 10 ug/kg/min!82 !141/69 (93) !None    ! +-----------------------+---+------------+--------+ !Dobutamine 20 ug/kg/min!89 !152/69 (97) !None    ! +-----------------------+---+------------+--------+ !Immediate post stress  !80 !------------!None    ! +-----------------------+---+------------+--------+ !Recovery; 1 min        !80 !159/70 (100)!None    ! +-----------------------+---+------------+--------+ !Recovery; 2 min        !75 !------------!None    ! +-----------------------+---+------------+--------+ !Recovery; 3 min         !90 !158/72 (101)!None    ! +-----------------------+---+------------+--------+ !Recovery;  4 min        !100!------------!None    ! +-----------------------+---+------------+--------+ !Recovery; 5 min        !96 !113/70 (84) !None    ! +-----------------------+---+------------+--------+  ------------------------------------------------------------------- Stress results:   Maximal heart rate during stress was 100 bpm (71% of maximal predicted heart rate). The maximal predicted heart rate was 140 bpm.The target heart rate was achieved. The heart rate response to stress was normal. There was a normal resting blood pressure. Normal blood pressure response to dobutamine. The rate-pressure product for the peak heart rate and blood pressure was 16109 mm Hg/min.  The patient experienced no chest pain during stress.  ------------------------------------------------------------------- Stress ECG:   The stress ECG was normal.  ------------------------------------------------------------------- Baseline:  - LV size was mildly enlarged. - LV global systolic function was severely depressed. The estimated LV ejection fraction was 20%. - There was diffuse LV hypokinesis. LVOT VTI 60 cm/s; AV VTI 305 cm/s; Mean gradient 25 mmHg; AVA 0.8 cm2; DI 0.2.  Low dose: 10 micrograms/Kg/min-LVOT VTI 60 cm/s; AV VTI 360 cm/s; mean gradient 37 mmHg; AVA 0.63 cm2; DI 0.17 cm2. Peak stress:  - LVOT VTI 76 cm/2; AV VTI 384 cm2; mean gradient 41 mmHg: AVA 0.8 cm2; DI 0.2. - LV global systolic function was mildly to moderately depressed. The estimated LV ejection fraction was 40-45%.  Recovery:  ------------------------------------------------------------------- Measurements  Left ventricle                            Value          Reference LV ID, ED, PLAX chordal           (H)     55.7  mm       43 - 52 LV ID, ES, PLAX chordal           (H)     44.8  mm       23 - 38 LV fx shortening, PLAX chordal     (L)     20    %        >=29 LV PW thickness, ED                       11.9  mm       --------- IVS/LV PW ratio, ED                       1.02           <=1.3 Stroke volume, 2D                         50    ml       --------- Stroke volume/bsa, 2D                     24    ml/m^2   ---------  Ventricular septum                        Value          Reference IVS thickness, ED                         12.1  mm       ---------  LVOT  Value          Reference LVOT ID, S                                22    mm       --------- LVOT area                                 3.8   cm^2     --------- LVOT peak velocity, S                     79.8  cm/s     --------- LVOT mean velocity, S                     56.3  cm/s     --------- LVOT VTI, S                               13.1  cm       --------- LVOT peak gradient, S                     3     mm Hg    ---------  Aortic valve                              Value          Reference Aortic valve peak velocity, S             358   cm/s     --------- Aortic valve mean velocity, S             252   cm/s     --------- Aortic valve VTI, S                       50.5  cm       --------- Aortic mean gradient, S                   28    mm Hg    --------- Aortic peak gradient, S                   51    mm Hg    --------- VTI ratio, LVOT/AV                        0.26           --------- Aortic valve area, VTI                    0.99  cm^2     --------- Aortic valve area/bsa, VTI                0.48  cm^2/m^2 --------- Velocity ratio, peak, LVOT/AV             0.22           --------- Aortic valve area, peak velocity          0.85  cm^2     --------- Aortic valve area/bsa, peak               0.41  cm^2/m^2 ---------  velocity Velocity ratio, mean, LVOT/AV             0.22           --------- Aortic valve area, mean velocity          0.85  cm^2     --------- Aortic valve area/bsa, mean               0.41  cm^2/m^2  --------- velocity  Aorta                                     Value          Reference Aortic root ID, ED                        35    mm       ---------  Left atrium                               Value          Reference LA ID, A-P, ES                            46    mm       --------- LA ID/bsa, A-P                    (H)     2.22  cm/m^2   <=2.2  Legend: (L)  and  (H)  mark values outside specified reference range.  ------------------------------------------------------------------- Prepared and Electronically Authenticated by  Olga Millers 2018-05-07T16:25:31   ECHOCARDIOGRAM  ECHOCARDIOGRAM COMPLETE 02/15/2020  Narrative ECHOCARDIOGRAM REPORT    Patient Name:   Kendry T Rakes Date of Exam: 02/15/2020 Medical Rec #:  [id number]    Height:       66.0 in Accession #:    [id number]   Weight:       203.4 lb Date of Birth:  1934-07-15    BSA:          2.014 m Patient Age:    85 years     BP:           144/76 mmHg Patient Gender: M            HR:           70 bpm. Exam Location:  Outpatient  Procedure: 2D Echo, Cardiac Doppler and Color Doppler  Indications:    I35.0 Nonrheumatic aortic (valve) stenosis  History:        Patient has prior history of Echocardiogram examinations, most recent 07/13/2018. CHF, CAD, Aortic Valve Disease; Risk Factors:Hypertension, Diabetes, Dyslipidemia and GERD. Aortic Valve Replacement.  Sonographer:    Elmarie Shiley Dance Referring Phys: 91 DALTON S MCLEAN  IMPRESSIONS   1. Left ventricular ejection fraction, by estimation, is 55 to 60%. The left ventricle has normal function. The left ventricle has no regional wall motion abnormalities. Left ventricular diastolic parameters are consistent with Grade I diastolic dysfunction (impaired relaxation). 2. Right ventricular systolic function is normal. The right ventricular size is normal. 3. The mitral valve is normal in structure and function. No evidence of mitral valve regurgitation. No  evidence of mitral stenosis. 4. The aortic valve has been repaired/replaced. Aortic valve regurgitation is not visualized.  FINDINGS  Left Ventricle: Left ventricular ejection fraction, by estimation, is 55 to 60%. The left ventricle has normal function. The left ventricle has no regional wall motion abnormalities. The left ventricular internal cavity size was normal in size. There is no left ventricular hypertrophy. Left ventricular diastolic parameters are consistent with Grade I diastolic dysfunction (impaired relaxation).  Right Ventricle: The right ventricular size is normal. No increase in right ventricular wall thickness. Right ventricular systolic function is normal.  Left Atrium: Left atrial size was normal in size.  Right Atrium: Right atrial size was normal in size.  Pericardium: There is no evidence of pericardial effusion.  Mitral Valve: The mitral valve is normal in structure and function. No evidence of mitral valve regurgitation. No evidence of mitral valve stenosis.  Tricuspid Valve: The tricuspid valve is normal in structure. Tricuspid valve regurgitation is not demonstrated.  Aortic Valve: This AV gradient is appropriate for this TAVR. The aortic valve has been repaired/replaced. Aortic valve regurgitation is not visualized. Aortic valve mean gradient measures 9.5 mmHg. Aortic valve peak gradient measures 18.8 mmHg. Aortic valve area, by VTI measures 1.09 cm. There is a Statistician prosthetic, stented (TAVR) valve present in the aortic position.  Pulmonic Valve: The pulmonic valve was normal in structure. Pulmonic valve regurgitation is mild.  Aorta: The aortic root and ascending aorta are structurally normal, with no evidence of dilitation.  IAS/Shunts: The atrial septum is grossly normal.   LEFT VENTRICLE PLAX 2D LVIDd:         5.40 cm  Diastology LVIDs:         4.60 cm  LV e' lateral:   8.49 cm/s LV PW:         1.00 cm  LV E/e' lateral: 7.5 LV IVS:         1.00 cm  LV e' medial:    6.42 cm/s LVOT diam:     2.20 cm  LV E/e' medial:  10.0 LV SV:         51 LV SV Index:   25 LVOT Area:     3.80 cm   RIGHT VENTRICLE            IVC RV Basal diam:  2.00 cm    IVC diam: 1.80 cm RV S prime:     7.51 cm/s TAPSE (M-mode): 1.6 cm  LEFT ATRIUM             Index       RIGHT ATRIUM          Index LA diam:        4.40 cm 2.18 cm/m  RA Area:     8.60 cm LA Vol (A2C):   52.9 ml 26.26 ml/m RA Volume:   13.90 ml 6.90 ml/m LA Vol (A4C):   38.7 ml 19.21 ml/m LA Biplane Vol: 46.5 ml 23.09 ml/m AORTIC VALVE AV Area (Vmax):    1.16 cm AV Area (Vmean):   1.10 cm AV Area (VTI):     1.09 cm AV Vmax:           217.00 cm/s AV Vmean:          143.500 cm/s AV VTI:            0.472 m AV Peak Grad:      18.8 mmHg AV Mean Grad:      9.5 mmHg LVOT Vmax:         66.50 cm/s LVOT Vmean:  41.450 cm/s LVOT VTI:          0.135 m LVOT/AV VTI ratio: 0.29  AORTA Ao Root diam: 2.70 cm Ao Asc diam:  3.50 cm  MITRAL VALVE MV Area (PHT): 3.17 cm    SHUNTS MV Decel Time: 239 msec    Systemic VTI:  0.14 m MV E velocity: 64.00 cm/s  Systemic Diam: 2.20 cm MV A velocity: 84.40 cm/s MV E/A ratio:  0.76  Kristeen Miss MD Electronically signed by Kristeen Miss MD Signature Date/Time: 02/15/2020/5:34:30 PM    Final   TEE  ECHO TEE 06/22/2017  Narrative *[place]* *[facility]* [address] [place], [place] [address] [phone]  ------------------------------------------------------------------- Transthoracic Echocardiography  Patient:    Yusef, Lamp MR #:       [id number] Study Date: 06/22/2017 Gender:     M Age:        82 Height:     168.9 cm Weight:     88.5 kg BSA:        2.07 m^2 Pt. Status: Room:       [facility]  ADMITTING    Tonny Bollman, MD ATTENDING    Tonny Bollman, MD ORDERING     Tonny Bollman, MD REFERRING    Tonny Bollman, MD PERFORMING   Marca Ancona, M.D. SONOGRAPHER  Arvil Chaco  cc:  -------------------------------------------------------------------  ------------------------------------------------------------------- Indications:     Aortic valve repair/replacement  ------------------------------------------------------------------- Impressions:  - Only post-TAVR images available. 1. Normal right ventricular size and systolic function. 2. Normal left ventricular size with mild LV hypertrophy. EF estimated 50%. 3. Well-seated bioprosthetic aortic valve s/p TAVR. Trivial peri-valvular regurgitation. No significant stenosis, mean gradient 6 mmHg. 4. Mild mitral regurgitation. 5. Trivial tricuspid regurgitation.  ------------------------------------------------------------------- Study data:   Study status:  Routine.  Procedure:  Transthoracic echocardiography. Image quality was adequate. Transthoracic echocardiography.  M-mode, complete 2D, spectral Doppler, and color Doppler.  Birthdate:  Patient birthdate: 1934/07/09.  Age:  Patient is 89 yr old.  Sex:  Gender: male. BMI: 31 kg/m^2.  Patient status:  Inpatient.  Study date:  Study date: 06/22/2017. Study time: 08:39 AM.  -------------------------------------------------------------------  ------------------------------------------------------------------- Aortic valve:   Doppler:     Mean gradient (S): 4 mm Hg. Peak gradient (S): 6 mm Hg.  ------------------------------------------------------------------- Pre bypass:  Post bypass:  ------------------------------------------------------------------- Measurements  Aortic valve                       Value Aortic valve peak velocity, S      127   cm/s Aortic valve mean velocity, S      94.4  cm/s Aortic valve VTI, S                25.6  cm Aortic mean gradient, S            4     mm Hg Aortic peak gradient, S            6     mm Hg  Legend: (L)  and  (H)  mark values outside specified reference  range.  ------------------------------------------------------------------- Prepared and Electronically Authenticated by  Marca Ancona, M.D. 2018-07-03T16:12:30    CT SCANS  CT CORONARY MORPH W/CTA COR W/SCORE 06/11/2017  Addendum 06/11/2017  4:10 PM ADDENDUM REPORT: 06/11/2017 16:08  CLINICAL DATA:  89 year old male with severe aortic stenosis being evaluated for TAVR.  EXAM: Cardiac TAVR CT  TECHNIQUE: The patient was scanned on a Philips  256 scanner. A 120 kV retrospective scan was triggered in the descending thoracic aorta at 111 HU's. Gantry rotation speed was 270 msecs and collimation was .9 mm. 10 mg of iv Metoprolol and no nitro were given. The 3D data set was reconstructed in 5% intervals of the R-R cycle. Systolic and diastolic phases were analyzed on a dedicated work station using MPR, MIP and VRT modes. The patient received 80 cc of contrast.  FINDINGS: Aortic Valve: Trileaflet, moderately thickened and calcified aortic valve with severely restricted leaflet opening. No calcifications extending into the LVOT. LVOT is large.  Aorta:  Normal size, trivial calcifications.  No dissection.  Sinotubular Junction:  30 x 28 mm  Ascending Thoracic Aorta:  37 x 36 mm  Aortic Arch:  Not visualized  Descending Thoracic Aorta:  26 x 23 mm  Sinus of Valsalva Measurements:  Non-coronary:  34 mm  Right -coronary:  29 mm  Left -coronary:  34 mm  Coronary Artery Height above Annulus:  Left Main:  14 mm  Right Coronary:  13 mm  Virtual Basal Annulus Measurements:  Maximum/Minimum Diameter:  28 x 24 mm  Perimeter:  84 mm  Area:  535 mm2  Optimum Fluoroscopic Angle for Delivery:  LAO 0 CAU 0  IMPRESSION: 1. Trileaflet, moderately thickened and calcified aortic valve with severely restricted leaflet opening. No calcifications extending into the LVOT. LVOT is large and makes annular sizing challenging. Annular measurements suitable for a delivery of a 26  mm Edwards-SAPIEN 3 valve.  2.  Sufficient annulus to coronary distance.  3. Optimum Fluoroscopic Angle for Delivery:  LAO 0 CAU 0  4.  No thrombus in the left atrial appendage.  Tobias Alexander   Electronically Signed By: Tobias Alexander On: 06/11/2017 16:08  Narrative EXAM: OVER-READ INTERPRETATION  CT CHEST  The following report is an over-read performed by radiologist Dr. Noe Gens [facility] on 06/11/2017. This over-read does not include interpretation of cardiac or coronary anatomy or pathology. The coronary CTA interpretation by the cardiologist is attached.  COMPARISON:  06/03/2017  FINDINGS: Cardiovascular: Heart is normal size. Extensive calcifications throughout the aorta which is normal caliber. No dissection.  Mediastinum/Nodes: No adenopathy in the visualized lower mediastinum or hila. Calcified left hilar lymph nodes.  Lungs/Pleura: Visualized lungs are clear.  No effusions.  Upper Abdomen: Imaging into the upper abdomen shows no acute findings.  Musculoskeletal: No acute bony abnormality. Chest wall soft tissues unremarkable. Prior median sternotomy and CABG.  IMPRESSION: No acute or significant extracardiac abnormality.  Electronically Signed: By: Charlett Nose M.D. On: 06/11/2017 14:41    PYP SCAN  MYOCARDIAL AMYLOID PLANAR AND SPECT 07/18/2021  Narrative  The study is normal.  1. H/CL ratio 1.1 at 1 hour and 1.2 at 3 hours. Visually this is Grade 0. 2. Overall, negative for TTR cardiac amyloidosis.         Physical Exam:    VS:  BP (!) 120/58   Pulse 82   Ht 5' 6.5" (1.689 m)   Wt 168 lb (76.2 kg)   SpO2 98%   BMI 26.71 kg/m    Wt Readings from Last 3 Encounters:  07/12/23 168 lb (76.2 kg)  05/27/23 168 lb (76.2 kg)  05/10/23 174 lb 1.6 oz (79 kg)    Gen: no distress, elderly male   Neck: No JVD Cardiac: No Rubs or Gallops, systolic murmur, RRR +2  radial pulses Respiratory: Clear to auscultation  bilaterally, normal effort, normal  respiratory rate  GI: Soft, nontender, non-distended  MS: No  edema;  moves all extremities Integument: Skin feels warm Neuro:  At time of evaluation, alert and oriented to person/place/time/situation  Psych: Normal affect, patient feels well   ASSESSMENT AND PLAN: .    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 - anginal equivalent was chest pain with indigestion - continue 81 mg PO daily of ASA -  No PRN nitroglycerin needed - rosuvastatin 5 mg 3X week - DASI  7.2; < 4 mets largely due to back pain - he will let me know if they are planning for surgery and we will get a Lexiscan stress test if this is planned - LDL < 70; discussed dietary foods rich in vitamin K2 after his questions about K2 (natto, fermented foods; he has gout and discussed these risks)   Heart Failure Recovered Ejection Fraction (combined systolic and diastolic) S/p TAVR and HTN - with CKD stage IV - chronic - NYHA class I, Stage C, euvolemic, etiology from ischemic but has recovered post TAVR - Diuretic regimen: None- he is not aware of a true lasix allergy, continue torsemide 20 mg 3x week and keep his salt restriction - will repeat echo given his prior TAVR  One year with me or Tessa unless heart failure or positive stress test   Riley Lam, MD FASE [facility] Hospital Cardiologist [facility]  [address] [place], [place] [address] [phone]  10:01 AM

## 2023-07-12 ENCOUNTER — Ambulatory Visit: Payer: PPO | Attending: Internal Medicine | Admitting: Internal Medicine

## 2023-07-12 ENCOUNTER — Encounter: Payer: Self-pay | Admitting: Internal Medicine

## 2023-07-12 VITALS — BP 120/58 | HR 82 | Ht 66.5 in | Wt 168.0 lb

## 2023-07-12 DIAGNOSIS — E1169 Type 2 diabetes mellitus with other specified complication: Secondary | ICD-10-CM

## 2023-07-12 DIAGNOSIS — T466X5A Adverse effect of antihyperlipidemic and antiarteriosclerotic drugs, initial encounter: Secondary | ICD-10-CM

## 2023-07-12 DIAGNOSIS — Z951 Presence of aortocoronary bypass graft: Secondary | ICD-10-CM

## 2023-07-12 DIAGNOSIS — Z952 Presence of prosthetic heart valve: Secondary | ICD-10-CM

## 2023-07-12 DIAGNOSIS — M791 Myalgia, unspecified site: Secondary | ICD-10-CM | POA: Diagnosis not present

## 2023-07-12 DIAGNOSIS — I2510 Atherosclerotic heart disease of native coronary artery without angina pectoris: Secondary | ICD-10-CM

## 2023-07-12 DIAGNOSIS — I10 Essential (primary) hypertension: Secondary | ICD-10-CM

## 2023-07-12 DIAGNOSIS — I5042 Chronic combined systolic (congestive) and diastolic (congestive) heart failure: Secondary | ICD-10-CM

## 2023-07-12 DIAGNOSIS — E785 Hyperlipidemia, unspecified: Secondary | ICD-10-CM

## 2023-07-12 NOTE — Patient Instructions (Signed)
Medication Instructions:  Your physician recommends that you continue on your current medications as directed. Please refer to the Current Medication list given to you today.  *If you need a refill on your cardiac medications before your next appointment, please call your pharmacy*   Lab Work: NONE  If you have labs (blood work) drawn today and your tests are completely normal, you will receive your results only by: MyChart Message (if you have MyChart) OR A paper copy in the mail If you have any lab test that is abnormal or we need to change your treatment, we will call you to review the results.   Testing/Procedures: Your physician has requested that you have an echocardiogram. Echocardiography is a painless test that uses sound waves to create images of your heart. It provides your doctor with information about the size and shape of your heart and how well your heart's chambers and valves are working. This procedure takes approximately one hour. There are no restrictions for this procedure. Please do NOT wear cologne, perfume, aftershave, or lotions (deodorant is allowed). Please arrive 15 minutes prior to your appointment time.    Follow-Up: At [facility], you and your health needs are our priority.  As part of our continuing mission to provide you with exceptional heart care, we have created designated Provider Care Teams.  These Care Teams include your primary Cardiologist (physician) and Advanced Practice Providers (APPs -  Physician Assistants and Nurse Practitioners) who all work together to provide you with the care you need, when you need it.  We recommend signing up for the patient portal called "MyChart".  Sign up information is provided on this After Visit Summary.  MyChart is used to connect with patients for Virtual Visits (Telemedicine).  Patients are able to view lab/test results, encounter notes, upcoming appointments, etc.  Non-urgent messages can be sent to  your provider as well.   To learn more about what you can do with MyChart, go to [email or web address].    Your next appointment:   1 year(s)  Provider:   Christell Constant, MD  or Jari Favre, PA-C

## 2023-07-14 ENCOUNTER — Ambulatory Visit: Payer: PPO | Admitting: Internal Medicine

## 2023-08-02 ENCOUNTER — Other Ambulatory Visit (HOSPITAL_COMMUNITY): Payer: PPO

## 2023-08-05 ENCOUNTER — Telehealth: Payer: Self-pay | Admitting: Internal Medicine

## 2023-08-05 ENCOUNTER — Ambulatory Visit (HOSPITAL_COMMUNITY): Payer: PPO | Attending: Internal Medicine

## 2023-08-05 DIAGNOSIS — Z952 Presence of prosthetic heart valve: Secondary | ICD-10-CM | POA: Diagnosis present

## 2023-08-05 LAB — ECHOCARDIOGRAM COMPLETE
AV Mean grad: 10 mmHg
AV Peak grad: 16.8 mmHg
Ao pk vel: 2.05 m/s
Area-P 1/2: 2.83 cm2
Est EF: 30
MV M vel: 5.32 m/s
MV Peak grad: 113.2 mmHg
S' Lateral: 4.6 cm

## 2023-08-05 NOTE — Telephone Encounter (Signed)
Called Patient - reviewed images; LVEF moderately decreased, has baseline CKD stage IV - his sx are stable - discussed lexiscan vs medication therapy - he is still having issues with his bladder infection and would like to defer   Assessment - if urgent CP/SOB-> ED visit - if stable escalation, start IMDUR and bring back for eval - APP vs in October  Patient had no further questions.  Riley Lam, MD Cardiologist [facility]  [address] [place], [address] [place], [place] [address] [phone]  4:05 PM

## 2023-08-11 ENCOUNTER — Telehealth: Payer: Self-pay | Admitting: Interventional Cardiology

## 2023-08-11 ENCOUNTER — Telehealth: Payer: Self-pay | Admitting: Internal Medicine

## 2023-08-11 DIAGNOSIS — Z952 Presence of prosthetic heart valve: Secondary | ICD-10-CM

## 2023-08-11 DIAGNOSIS — I2510 Atherosclerotic heart disease of native coronary artery without angina pectoris: Secondary | ICD-10-CM

## 2023-08-11 DIAGNOSIS — Z951 Presence of aortocoronary bypass graft: Secondary | ICD-10-CM

## 2023-08-11 NOTE — Telephone Encounter (Signed)
See other phone note

## 2023-08-11 NOTE — Telephone Encounter (Signed)
Called pt advised per MD OV note on 07/12/23 pt will need a Lexiscan prior to surgery. "he will let me know if they are planning for surgery and we will get a Lexiscan stress test if this is planned   Pt reports is going to have back surgery. Is waiting on a date for surgery. Reviewed instructions for lexiscan with pt; wife wrote down instructions. All questions answered.

## 2023-08-11 NOTE — Telephone Encounter (Signed)
New Message:   Patient said Dr Izora Ribas had suggested tht he have a test and he told him that he would wait. Patient would like to schedule this test at this time. He says he needs to have surgery and he needs this test before he can have his surgery.

## 2023-08-11 NOTE — Telephone Encounter (Signed)
Did not need this encounter °

## 2023-08-13 ENCOUNTER — Telehealth: Payer: Self-pay | Admitting: Internal Medicine

## 2023-08-13 ENCOUNTER — Ambulatory Visit (HOSPITAL_COMMUNITY): Payer: PPO | Attending: Internal Medicine

## 2023-08-13 DIAGNOSIS — I2510 Atherosclerotic heart disease of native coronary artery without angina pectoris: Secondary | ICD-10-CM | POA: Insufficient documentation

## 2023-08-13 DIAGNOSIS — Z951 Presence of aortocoronary bypass graft: Secondary | ICD-10-CM | POA: Diagnosis present

## 2023-08-13 DIAGNOSIS — Z952 Presence of prosthetic heart valve: Secondary | ICD-10-CM | POA: Insufficient documentation

## 2023-08-13 LAB — MYOCARDIAL PERFUSION IMAGING
LV dias vol: 183 mL (ref 62–150)
LV sys vol: 137 mL
Nuc Stress EF: 25 %
Peak HR: 97 {beats}/min
Rest HR: 71 {beats}/min
Rest Nuclear Isotope Dose: 10.2 mCi
SDS: 10
SRS: 3
SSS: 13
ST Depression (mm): 0 mm
Stress Nuclear Isotope Dose: 29.8 mCi
TID: 1.03

## 2023-08-13 MED ORDER — REGADENOSON 0.4 MG/5ML IV SOLN
0.4000 mg | Freq: Once | INTRAVENOUS | Status: AC
  Administered 2023-08-13: 0.4 mg via INTRAVENOUS

## 2023-08-13 MED ORDER — TECHNETIUM TC 99M TETROFOSMIN IV KIT
10.2000 | PACK | Freq: Once | INTRAVENOUS | Status: AC | PRN
  Administered 2023-08-13: 10.2 via INTRAVENOUS

## 2023-08-13 MED ORDER — TECHNETIUM TC 99M TETROFOSMIN IV KIT
29.8000 | PACK | Freq: Once | INTRAVENOUS | Status: AC | PRN
  Administered 2023-08-13: 29.8 via INTRAVENOUS

## 2023-08-13 NOTE — Telephone Encounter (Signed)
Called Patient to discuss results. 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  I am unclear that there would be a clear target for intervention.  He would be high risk for surgery.   I would like him to come in 08/26/23 (overbook for me) to discuss optimization before his surgery 09/08/2023.  Get EKG on this visit; depending on exam we may pursue LHC/RHC vs medical management.  Patient had no further questions.  Riley Lam, MD Cardiologist [facility]  [address] [place], [address] [place], [place] [address] [phone]  5:30 PM

## 2023-08-16 NOTE — Telephone Encounter (Signed)
Called patient and made him an appointment on 08/26/23

## 2023-08-26 ENCOUNTER — Encounter: Payer: Self-pay | Admitting: Internal Medicine

## 2023-08-26 ENCOUNTER — Ambulatory Visit: Payer: PPO | Attending: Internal Medicine | Admitting: Internal Medicine

## 2023-08-26 VITALS — BP 122/60 | HR 81 | Ht 66.0 in | Wt 165.8 lb

## 2023-08-26 DIAGNOSIS — I2510 Atherosclerotic heart disease of native coronary artery without angina pectoris: Secondary | ICD-10-CM | POA: Diagnosis not present

## 2023-08-26 DIAGNOSIS — Z951 Presence of aortocoronary bypass graft: Secondary | ICD-10-CM

## 2023-08-26 DIAGNOSIS — I5022 Chronic systolic (congestive) heart failure: Secondary | ICD-10-CM

## 2023-08-26 DIAGNOSIS — N183 Chronic kidney disease, stage 3 unspecified: Secondary | ICD-10-CM

## 2023-08-26 DIAGNOSIS — Z952 Presence of prosthetic heart valve: Secondary | ICD-10-CM

## 2023-08-26 DIAGNOSIS — I10 Essential (primary) hypertension: Secondary | ICD-10-CM | POA: Diagnosis not present

## 2023-08-26 MED ORDER — METOPROLOL SUCCINATE ER 25 MG PO TB24: 25.0000 mg | ORAL_TABLET | Freq: Every day | ORAL | 3 refills | Status: DC

## 2023-08-26 MED ORDER — NITROGLYCERIN 0.4 MG SL SUBL: 0.4000 mg | SUBLINGUAL_TABLET | SUBLINGUAL | 3 refills | Status: DC | PRN

## 2023-08-26 NOTE — Patient Instructions (Signed)
Medication Instructions:  Your physician has recommended you make the following change in your medication:  START: metoprolol succinate (Toprol-XL) 25 mg by mouth once daily  *If you need a refill on your cardiac medications before your next appointment, please call your pharmacy*   Lab Work: BMP, CBC  If you have labs (blood work) drawn today and your tests are completely normal, you will receive your results only by: MyChart Message (if you have MyChart) OR A paper copy in the mail If you have any lab test that is abnormal or we need to change your treatment, we will call you to review the results.   Testing/Procedures: Your physician has requested that you have a cardiac catheterization. Cardiac catheterization is used to diagnose and/or treat various heart conditions. Doctors may recommend this procedure for a number of different reasons. The most common reason is to evaluate chest pain. Chest pain can be a symptom of coronary artery disease (CAD), and cardiac catheterization can show whether plaque is narrowing or blocking your heart's arteries. This procedure is also used to evaluate the valves, as well as measure the blood flow and oxygen levels in different parts of your heart. For further information please visit [email or web address] Please follow instruction sheet, as given.    Follow-Up: At [facility], you and your health needs are our priority.  As part of our continuing mission to provide you with exceptional heart care, we have created designated Provider Care Teams.  These Care Teams include your primary Cardiologist (physician) and Advanced Practice Providers (APPs -  Physician Assistants and Nurse Practitioners) who all work together to provide you with the care you need, when you need it.  We recommend signing up for the patient portal called "MyChart".  Sign up information is provided on this After Visit Summary.  MyChart is used to connect with patients for  Virtual Visits (Telemedicine).  Patients are able to view lab/test results, encounter notes, upcoming appointments, etc.  Non-urgent messages can be sent to your provider as well.   To learn more about what you can do with MyChart, go to [email or web address].    Your next appointment:   1-2 month(s)  Provider:   Christell Constant, MD  or Jari Favre, PA-C, Ronie Spies, PA-C, Robin Searing, NP, Jacolyn Reedy, PA-C, Eligha Bridegroom, NP, Tereso Newcomer, PA-C, or Perlie Gold, PA-C       Other Instructions    You are scheduled for a Cardiac Catheterization on Wednesday, September 11 with Dr. Lance Muss.  1. Please arrive at the [facility] (Main Entrance A) at [facility]: [address] [place], [place] [address] at 6:00 AM (This time is 5 hour(s) before your procedure to ensure your preparation). Free valet parking service is available. You will check in at ADMITTING. The support person will be asked to wait in the waiting room.  It is OK to have someone drop you off and come back when you are ready to be discharged.    Special note: Every effort is made to have your procedure done on time. Please understand that emergencies sometimes delay scheduled procedures.  2. Diet: Do not eat solid foods after midnight.  The patient may have clear liquids until 5am upon the day of the procedure.  3. Labs: You will need to have blood drawn TODAY.  4. Medication instructions in preparation for your procedure:   Contrast Allergy: No   Stop taking, Torsemide (Demadex) Wednesday, September 11,.  Do not take this  medication the day of Heart Cath.  Do Not take glipizide (Glucotrol) the day of Heart Cath.   On the morning of your procedure, take your Aspirin 81 mg and any morning medicines NOT listed above.  You may use sips of water.  5. Plan to go home the same day, you will only stay overnight if medically necessary. 6. Bring a current list of your medications and current  insurance cards. 7. You MUST have a responsible person to drive you home. 8. Someone MUST be with you the first 24 hours after you arrive home or your discharge will be delayed. 9. Please wear clothes that are easy to get on and off and wear slip-on shoes.  Thank you for allowing [place] to care for you!   -- [place]

## 2023-08-26 NOTE — H&P (View-Only) (Signed)
Cardiology Office Note:  .    Date:  08/26/2023  ID:  [name], DOB [birth year], MRN [id number] PCP: Emilio Aspen, MD  [facility] Providers Cardiologist:  Christell Constant, MD Advanced Heart Failure:  Marca Ancona, MD     CC: Follow up stress test  History of Present Illness: .    [name] is a 89 y.o. 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  Patient notes that he is doing well.   Since last visit notes that he neuropathy and back are the things that bother him. There are no interval hospital/ED visit.   EKG showed SR with rare PACs.  No chest pain or pressure .  No SOB/DOE and no PND/Orthopnea.  No weight gain (has some inadvertent weight loss juggling things)   No palpitations or syncope.  Relevant histories: .  2021: LVEF recovered no long following with Dr. Shirlee Latch 2024: Had new drop in LVEF.  Had new infarct.  Has poor kidneys.  Is pending back surgery. Social- saw Dr. Katrinka Blazing, former use car salesman and owner.  Wife has Dementia. ROS: As per HPI.   Studies Reviewed: .   Cardiac Studies & Procedures   CARDIAC CATHETERIZATION  CARDIAC CATHETERIZATION 11/03/2016  Narrative  Severe native vessel coronary disease with total occlusion of the left main and native right coronary.  Bypass graft failure with occlusion of the saphenous vein graft to the ramus intermedius.  Widely patent saphenous vein graft to the PDA and PL branch.  Widely patent LIMA to the LAD  Normal pulmonary artery pressures including capillary wedge.  Findings Coronary Findings Diagnostic  Dominance: Right  Left Main  Left Anterior Descending  First Diagonal Branch  Second Diagonal Branch Vessel is small in size.  Third Diagonal Branch Vessel is small in size.  Right Coronary Artery  Right Posterior Descending Artery Vessel is  small in size.  First Right Posterolateral Branch Vessel is small in size.  LIMA LIMA Graft To Dist LAD LIMA.  Graft To RPAV and is very large.  Saphenous Graft To Ramus SVG.  Intervention  No interventions have been documented.   STRESS TESTS  MYOCARDIAL PERFUSION IMAGING 08/13/2023  Narrative   Findings are consistent with infarction. The study is high risk.   No ST deviation was noted.   Left ventricular function is abnormal. Global function is severely reduced. There were multiple regional abnormalities. End diastolic cavity size is severely enlarged. End systolic cavity size is severely enlarged.   Prior study available for comparison from 10/22/2016.  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  LVEF is similar from prior.  Given LVEF and burden of infarct, high risk study.   ECHOCARDIOGRAM  ECHOCARDIOGRAM COMPLETE 08/05/2023  Narrative ECHOCARDIOGRAM REPORT    Patient Name:   [name]  Date of Exam: 08/05/2023 Medical Rec #:  [id number]     Height:       66.5 in Accession #:    [id number]    Weight:       168.0 lb Date of Birth:  January 20, 1934     BSA:          1.867 m Patient Age:    88 years      BP:           120/58 mmHg Patient Gender: M  HR:           82 bpm. Exam Location:  [address]  Procedure: 2D Echo, Cardiac Doppler and Color Doppler  MODIFIED REPORT: This report was modified by Dietrich Pates MD on 08/05/2023 due to Amend AV findings. Indications:     Z95.2 S/P TAVR  History:         Patient has prior history of Echocardiogram examinations, most recent 02/15/2020. CHF, CAD, Prior CABG; Risk Factors:Hypertension, Dyslipidemia and Diabetes. S/P partial L nephrectomy. Prostate cancer s/p radiation.  Sonographer:     Cathie Beams RCS Referring Phys:  [id number] [facility] Izora Ribas Diagnosing Phys: Dietrich Pates MD  IMPRESSIONS   1. Diffuse hypokinesis worse in the inferior, anterior, septal walls. . Left ventricular ejection fraction, by  estimation, is 30%. The left ventricle has severely decreased function. The left ventricular internal cavity size was moderately dilated. There is mild left ventricular hypertrophy. Left ventricular diastolic parameters are consistent with Grade I diastolic dysfunction (impaired relaxation). 2. Right ventricular systolic function is moderately reduced. The right ventricular size is normal. 3. The mitral valve is normal in structure. Mild mitral valve regurgitation. 4. S/p TAVR (Procedure date 07/02/2017 with 29 mm Sapien 3 prosthesis). Peak and mean gradients through the valve are 17 and 10 mm HG respectively. COmpared to echo from 2021, no significant change in mean gradient.. The aortic valve has been repaired/replaced. Aortic valve regurgitation is trivial. 5. The inferior vena cava is normal in size with greater than 50% respiratory variability, suggesting right atrial pressure of 3 mmHg.  FINDINGS Left Ventricle: Diffuse hypokinesis worse in the inferior, anterior, septal walls. Left ventricular ejection fraction, by estimation, is 30%. The left ventricle has severely decreased function. The left ventricular internal cavity size was moderately dilated. There is mild left ventricular hypertrophy. Left ventricular diastolic parameters are consistent with Grade I diastolic dysfunction (impaired relaxation).  Right Ventricle: The right ventricular size is normal. Right vetricular wall thickness was not assessed. Right ventricular systolic function is moderately reduced.  Left Atrium: Left atrial size was normal in size.  Right Atrium: Right atrial size was normal in size.  Pericardium: There is no evidence of pericardial effusion.  Mitral Valve: The mitral valve is normal in structure. Mild mitral valve regurgitation.  Tricuspid Valve: The tricuspid valve is normal in structure. Tricuspid valve regurgitation is trivial.  Aortic Valve: S/p TAVR (Procedure date 07/02/2017 with 29 mm Sapien 3  prosthesis). Peak and mean gradients through the valve are 17 and 10 mm HG respectively. COmpared to echo from 2021, no significant change in mean gradient. The aortic valve has been repaired/replaced. Aortic valve regurgitation is trivial. Aortic valve mean gradient measures 10.0 mmHg. Aortic valve peak gradient measures 16.8 mmHg.  Pulmonic Valve: The pulmonic valve was normal in structure. Pulmonic valve regurgitation is not visualized.  Aorta: The aortic root and ascending aorta are structurally normal, with no evidence of dilitation.  Venous: The inferior vena cava is normal in size with greater than 50% respiratory variability, suggesting right atrial pressure of 3 mmHg.  IAS/Shunts: No atrial level shunt detected by color flow Doppler.   LEFT VENTRICLE PLAX 2D LVIDd:         6.00 cm Diastology LVIDs:         4.60 cm LV e' medial:    5.11 cm/s LV PW:         1.20 cm LV E/e' medial:  12.3 LV IVS:        0.80 cm LV  e' lateral:   8.49 cm/s LV E/e' lateral: 7.4   RIGHT VENTRICLE RV Basal diam:  2.00 cm RV S prime:     6.25 cm/s TAPSE (M-mode): 1.1 cm  LEFT ATRIUM             Index        RIGHT ATRIUM           Index LA diam:        3.70 cm 1.98 cm/m   RA Area:     11.50 cm LA Vol (A2C):   25.3 ml 13.55 ml/m  RA Volume:   21.80 ml  11.67 ml/m LA Vol (A4C):   31.7 ml 16.98 ml/m LA Biplane Vol: 29.2 ml 15.64 ml/m AORTIC VALVE AV Vmax:           205.00 cm/s AV Vmean:          142.000 cm/s AV VTI:            0.403 m AV Peak Grad:      16.8 mmHg AV Mean Grad:      10.0 mmHg LVOT Vmax:         90.10 cm/s LVOT Vmean:        56.900 cm/s LVOT VTI:          0.178 m LVOT/AV VTI ratio: 0.44  AORTA Ao Asc diam: 3.90 cm  MITRAL VALVE MV Area (PHT): 2.83 cm    SHUNTS MV Decel Time: 268 msec    Systemic VTI: 0.18 m MR Peak grad: 113.2 mmHg MR Mean grad: 63.0 mmHg MR Vmax:      532.00 cm/s MR Vmean:     361.0 cm/s MV E velocity: 63.00 cm/s MV A velocity: 93.80 cm/s MV E/A  ratio:  0.67  Dietrich Pates MD Electronically signed by Dietrich Pates MD Signature Date/Time: 08/05/2023/3:40:26 PM    Final (Updated)   TEE  ECHO TEE 06/22/2017  Narrative *[place]* *[facility]* [address] [place], [place] [address] [phone]  ------------------------------------------------------------------- Transthoracic Echocardiography  Patient:    [name], [name] MR #:       [id number] Study Date: 06/22/2017 Gender:     M Age:        82 Height:     168.9 cm Weight:     88.5 kg BSA:        2.07 m^2 Pt. Status: Room:       [facility]  ADMITTING    Tonny Bollman, MD ATTENDING    Tonny Bollman, MD ORDERING     Tonny Bollman, MD REFERRING    Tonny Bollman, MD PERFORMING   Marca Ancona, M.D. SONOGRAPHER  Steven Chaco  cc:  -------------------------------------------------------------------  ------------------------------------------------------------------- Indications:     Aortic valve repair/replacement  ------------------------------------------------------------------- Impressions:  - Only post-TAVR images available. 1. Normal right ventricular size and systolic function. 2. Normal left ventricular size with mild LV hypertrophy. EF estimated 50%. 3. Well-seated bioprosthetic aortic valve s/p TAVR. Trivial peri-valvular regurgitation. No significant stenosis, mean gradient 6 mmHg. 4. Mild mitral regurgitation. 5. Trivial tricuspid regurgitation.  ------------------------------------------------------------------- Study data:   Study status:  Routine.  Procedure:  Transthoracic echocardiography. Image quality was adequate. Transthoracic echocardiography.  M-mode, complete 2D, spectral Doppler, and color Doppler.  Birthdate:  Patient birthdate: 13-Nov-1934.  Age:  Patient is 89 yr old.  Sex:  Gender: male. BMI: 31 kg/m^2.  Patient status:  Inpatient.  Study date:  Study date: 06/22/2017. Study time: 08:39  AM.  -------------------------------------------------------------------  -------------------------------------------------------------------  Aortic valve:   Doppler:     Mean gradient (S): 4 mm Hg. Peak gradient (S): 6 mm Hg.  ------------------------------------------------------------------- Pre bypass:  Post bypass:  ------------------------------------------------------------------- Measurements  Aortic valve                       Value Aortic valve peak velocity, S      127   cm/s Aortic valve mean velocity, S      94.4  cm/s Aortic valve VTI, S                25.6  cm Aortic mean gradient, S            4     mm Hg Aortic peak gradient, S            6     mm Hg  Legend: (L)  and  (H)  mark values outside specified reference range.  ------------------------------------------------------------------- Prepared and Electronically Authenticated by  Marca Ancona, M.D. 2018-07-03T16:12:30    CT SCANS  CT CORONARY MORPH W/CTA COR W/SCORE 06/11/2017  Addendum 06/11/2017  4:10 PM ADDENDUM REPORT: 06/11/2017 16:08  CLINICAL DATA:  89 year old male with severe aortic stenosis being evaluated for TAVR.  EXAM: Cardiac TAVR CT  TECHNIQUE: The patient was scanned on a Philips 256 scanner. A 120 kV retrospective scan was triggered in the descending thoracic aorta at 111 HU's. Gantry rotation speed was 270 msecs and collimation was .9 mm. 10 mg of iv Metoprolol and no nitro were given. The 3D data set was reconstructed in 5% intervals of the R-R cycle. Systolic and diastolic phases were analyzed on a dedicated work station using MPR, MIP and VRT modes. The patient received 80 cc of contrast.  FINDINGS: Aortic Valve: Trileaflet, moderately thickened and calcified aortic valve with severely restricted leaflet opening. No calcifications extending into the LVOT. LVOT is large.  Aorta:  Normal size, trivial calcifications.  No dissection.  Sinotubular Junction:  30 x 28  mm  Ascending Thoracic Aorta:  37 x 36 mm  Aortic Arch:  Not visualized  Descending Thoracic Aorta:  26 x 23 mm  Sinus of Valsalva Measurements:  Non-coronary:  34 mm  Right -coronary:  29 mm  Left -coronary:  34 mm  Coronary Artery Height above Annulus:  Left Main:  14 mm  Right Coronary:  13 mm  Virtual Basal Annulus Measurements:  Maximum/Minimum Diameter:  28 x 24 mm  Perimeter:  84 mm  Area:  535 mm2  Optimum Fluoroscopic Angle for Delivery:  LAO 0 CAU 0  IMPRESSION: 1. Trileaflet, moderately thickened and calcified aortic valve with severely restricted leaflet opening. No calcifications extending into the LVOT. LVOT is large and makes annular sizing challenging. Annular measurements suitable for a delivery of a 26 mm Edwards-SAPIEN 3 valve.  2.  Sufficient annulus to coronary distance.  3. Optimum Fluoroscopic Angle for Delivery:  LAO 0 CAU 0  4.  No thrombus in the left atrial appendage.  Tobias Alexander   Electronically Signed By: Tobias Alexander On: 06/11/2017 16:08  Narrative EXAM: OVER-READ INTERPRETATION  CT CHEST  The following report is an over-read performed by radiologist Dr. Noe Gens [facility] on 06/11/2017. This over-read does not include interpretation of cardiac or coronary anatomy or pathology. The coronary CTA interpretation by the cardiologist is attached.  COMPARISON:  06/03/2017  FINDINGS: Cardiovascular: Heart is normal size. Extensive calcifications throughout the aorta which is normal caliber. No dissection.  Mediastinum/Nodes: No adenopathy in the visualized lower mediastinum or hila. Calcified left hilar lymph nodes.  Lungs/Pleura: Visualized lungs are clear.  No effusions.  Upper Abdomen: Imaging into the upper abdomen shows no acute findings.  Musculoskeletal: No acute bony abnormality. Chest wall soft tissues unremarkable. Prior median sternotomy and CABG.  IMPRESSION: No acute or  significant extracardiac abnormality.  Electronically Signed: By: Charlett Nose M.D. On: 06/11/2017 14:41    PYP SCAN  MYOCARDIAL AMYLOID PLANAR AND SPECT 07/18/2021  Narrative  The study is normal.  1. H/CL ratio 1.1 at 1 hour and 1.2 at 3 hours. Visually this is Grade 0. 2. Overall, negative for TTR cardiac amyloidosis.         Physical Exam:    VS:  BP 122/60   Pulse 81   Ht 5\' 6"  (1.676 m)   Wt 165 lb 12.8 oz (75.2 kg)   SpO2 94%   BMI 26.76 kg/m    Wt Readings from Last 3 Encounters:  08/26/23 165 lb 12.8 oz (75.2 kg)  08/13/23 168 lb (76.2 kg)  07/12/23 168 lb (76.2 kg)    Gen: no distress, elderly male   Neck: No JVD Cardiac: No Rubs or Gallops, systolic murmur, RRR +2  radial pulses Respiratory: Clear to auscultation bilaterally, normal effort, normal  respiratory rate GI: Soft, nontender, non-distended  MS: trace edema;  moves all extremities Integument: Skin feels warm Neuro:  At time of evaluation, alert and oriented to person/place/time/situation  Psych: Tearful affect, patient feels ok  ASSESSMENT AND PLAN: .    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  He has a new decrease in his LVEF.  His and new infarct on  is planned.  I suspect his PDA is down; he has an upcoming surgery planned. - LDL < 70  Risks and benefits of cardiac catheterization have been discussed with the patient.  These include bleeding, infection, kidney damage, stroke, heart attack, death.  The patient understands these risks and is willing to proceed.  Access recommendations: L radial Procedural considerations He is asymptomatic of HF and infarction; if no high risk findings, will plan for 09/06/2023 surgery with Dr. Lorenso Courier, hold ASA prior.  If he has high risk anatomy and needs intervention,  his surgery will need to be cancelled.  Powers, [place] Callas, MD Neurological Surgery NPI: [id number] [address]  [place] [address]  Phone: [phone] Fax: [phone]    Heart Failure Reduced Ejection Fraction (combined systolic and diastolic) S/p TAVR and HTN - with CKD stage IV - chronic - NYHA class I, Stage C, euvolemic, etiology ischemic - Diuretic regimen: None- he is not aware of a true lasix allergy, continue torsemide 20 mg 3x week and keep his salt restriction - will start succinate 25 mg PO daily - post surgery PA/NP visit for GDMT titration  Dementia- family support - gave resources      Riley Lam, MD FASE [facility] Cardiologist [facility]  [address] [place], [address] [place], [place] [address] [phone]  10:45 AM

## 2023-08-26 NOTE — Progress Notes (Signed)
Cardiology Office Note:  .    Date:  08/26/2023  ID:  Remus Loffler, DOB [birth year], MRN [id number] PCP: Emilio Aspen, MD  [facility] Providers Cardiologist:  Christell Constant, MD Advanced Heart Failure:  Marca Ancona, MD     CC: Follow up stress test  History of Present Illness: .    [name] is a 89 y.o. 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  Patient notes that he is doing well.   Since last visit notes that he neuropathy and back are the things that bother him. There are no interval hospital/ED visit.   EKG showed SR with rare PACs.  No chest pain or pressure .  No SOB/DOE and no PND/Orthopnea.  No weight gain (has some inadvertent weight loss juggling things)   No palpitations or syncope.  Relevant histories: .  2021: LVEF recovered no long following with Dr. Shirlee Latch 2024: Had new drop in LVEF.  Had new infarct.  Has poor kidneys.  Is pending back surgery. Social- saw Dr. Katrinka Blazing, former use car salesman and owner.  Wife has Dementia. ROS: As per HPI.   Studies Reviewed: .   Cardiac Studies & Procedures   CARDIAC CATHETERIZATION  CARDIAC CATHETERIZATION 11/03/2016  Narrative  Severe native vessel coronary disease with total occlusion of the left main and native right coronary.  Bypass graft failure with occlusion of the saphenous vein graft to the ramus intermedius.  Widely patent saphenous vein graft to the PDA and PL branch.  Widely patent LIMA to the LAD  Normal pulmonary artery pressures including capillary wedge.  Findings Coronary Findings Diagnostic  Dominance: Right  Left Main  Left Anterior Descending  First Diagonal Branch  Second Diagonal Branch Vessel is small in size.  Third Diagonal Branch Vessel is small in size.  Right Coronary Artery  Right Posterior Descending Artery Vessel is  small in size.  First Right Posterolateral Branch Vessel is small in size.  LIMA LIMA Graft To Dist LAD LIMA.  Graft To RPAV and is very large.  Saphenous Graft To Ramus SVG.  Intervention  No interventions have been documented.   STRESS TESTS  MYOCARDIAL PERFUSION IMAGING 08/13/2023  Narrative   Findings are consistent with infarction. The study is high risk.   No ST deviation was noted.   Left ventricular function is abnormal. Global function is severely reduced. There were multiple regional abnormalities. End diastolic cavity size is severely enlarged. End systolic cavity size is severely enlarged.   Prior study available for comparison from 10/22/2016.  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  LVEF is similar from prior.  Given LVEF and burden of infarct, high risk study.   ECHOCARDIOGRAM  ECHOCARDIOGRAM COMPLETE 08/05/2023  Narrative ECHOCARDIOGRAM REPORT    Patient Name:   [name]  Date of Exam: 08/05/2023 Medical Rec #:  [id number]     Height:       66.5 in Accession #:    [id number]    Weight:       168.0 lb Date of Birth:  January 20, 1934     BSA:          1.867 m Patient Age:    88 years      BP:           120/58 mmHg Patient Gender: M  HR:           82 bpm. Exam Location:  [address]  Procedure: 2D Echo, Cardiac Doppler and Color Doppler  MODIFIED REPORT: This report was modified by Dietrich Pates MD on 08/05/2023 due to Amend AV findings. Indications:     Z95.2 S/P TAVR  History:         Patient has prior history of Echocardiogram examinations, most recent 02/15/2020. CHF, CAD, Prior CABG; Risk Factors:Hypertension, Dyslipidemia and Diabetes. S/P partial L nephrectomy. Prostate cancer s/p radiation.  Sonographer:     Cathie Beams RCS Referring Phys:  [id number] [facility] Izora Ribas Diagnosing Phys: Dietrich Pates MD  IMPRESSIONS   1. Diffuse hypokinesis worse in the inferior, anterior, septal walls. . Left ventricular ejection fraction, by  estimation, is 30%. The left ventricle has severely decreased function. The left ventricular internal cavity size was moderately dilated. There is mild left ventricular hypertrophy. Left ventricular diastolic parameters are consistent with Grade I diastolic dysfunction (impaired relaxation). 2. Right ventricular systolic function is moderately reduced. The right ventricular size is normal. 3. The mitral valve is normal in structure. Mild mitral valve regurgitation. 4. S/p TAVR (Procedure date 07/02/2017 with 29 mm Sapien 3 prosthesis). Peak and mean gradients through the valve are 17 and 10 mm HG respectively. COmpared to echo from 2021, no significant change in mean gradient.. The aortic valve has been repaired/replaced. Aortic valve regurgitation is trivial. 5. The inferior vena cava is normal in size with greater than 50% respiratory variability, suggesting right atrial pressure of 3 mmHg.  FINDINGS Left Ventricle: Diffuse hypokinesis worse in the inferior, anterior, septal walls. Left ventricular ejection fraction, by estimation, is 30%. The left ventricle has severely decreased function. The left ventricular internal cavity size was moderately dilated. There is mild left ventricular hypertrophy. Left ventricular diastolic parameters are consistent with Grade I diastolic dysfunction (impaired relaxation).  Right Ventricle: The right ventricular size is normal. Right vetricular wall thickness was not assessed. Right ventricular systolic function is moderately reduced.  Left Atrium: Left atrial size was normal in size.  Right Atrium: Right atrial size was normal in size.  Pericardium: There is no evidence of pericardial effusion.  Mitral Valve: The mitral valve is normal in structure. Mild mitral valve regurgitation.  Tricuspid Valve: The tricuspid valve is normal in structure. Tricuspid valve regurgitation is trivial.  Aortic Valve: S/p TAVR (Procedure date 07/02/2017 with 29 mm Sapien 3  prosthesis). Peak and mean gradients through the valve are 17 and 10 mm HG respectively. COmpared to echo from 2021, no significant change in mean gradient. The aortic valve has been repaired/replaced. Aortic valve regurgitation is trivial. Aortic valve mean gradient measures 10.0 mmHg. Aortic valve peak gradient measures 16.8 mmHg.  Pulmonic Valve: The pulmonic valve was normal in structure. Pulmonic valve regurgitation is not visualized.  Aorta: The aortic root and ascending aorta are structurally normal, with no evidence of dilitation.  Venous: The inferior vena cava is normal in size with greater than 50% respiratory variability, suggesting right atrial pressure of 3 mmHg.  IAS/Shunts: No atrial level shunt detected by color flow Doppler.   LEFT VENTRICLE PLAX 2D LVIDd:         6.00 cm Diastology LVIDs:         4.60 cm LV e' medial:    5.11 cm/s LV PW:         1.20 cm LV E/e' medial:  12.3 LV IVS:        0.80 cm LV  e' lateral:   8.49 cm/s LV E/e' lateral: 7.4   RIGHT VENTRICLE RV Basal diam:  2.00 cm RV S prime:     6.25 cm/s TAPSE (M-mode): 1.1 cm  LEFT ATRIUM             Index        RIGHT ATRIUM           Index LA diam:        3.70 cm 1.98 cm/m   RA Area:     11.50 cm LA Vol (A2C):   25.3 ml 13.55 ml/m  RA Volume:   21.80 ml  11.67 ml/m LA Vol (A4C):   31.7 ml 16.98 ml/m LA Biplane Vol: 29.2 ml 15.64 ml/m AORTIC VALVE AV Vmax:           205.00 cm/s AV Vmean:          142.000 cm/s AV VTI:            0.403 m AV Peak Grad:      16.8 mmHg AV Mean Grad:      10.0 mmHg LVOT Vmax:         90.10 cm/s LVOT Vmean:        56.900 cm/s LVOT VTI:          0.178 m LVOT/AV VTI ratio: 0.44  AORTA Ao Asc diam: 3.90 cm  MITRAL VALVE MV Area (PHT): 2.83 cm    SHUNTS MV Decel Time: 268 msec    Systemic VTI: 0.18 m MR Peak grad: 113.2 mmHg MR Mean grad: 63.0 mmHg MR Vmax:      532.00 cm/s MR Vmean:     361.0 cm/s MV E velocity: 63.00 cm/s MV A velocity: 93.80 cm/s MV E/A  ratio:  0.67  Dietrich Pates MD Electronically signed by Dietrich Pates MD Signature Date/Time: 08/05/2023/3:40:26 PM    Final (Updated)   TEE  ECHO TEE 06/22/2017  Narrative *[place]* *[facility]* [address] [place], [place] [address] [phone]  ------------------------------------------------------------------- Transthoracic Echocardiography  Patient:    Quayshaun, Wizner MR #:       [id number] Study Date: 06/22/2017 Gender:     M Age:        82 Height:     168.9 cm Weight:     88.5 kg BSA:        2.07 m^2 Pt. Status: Room:       [facility]  ADMITTING    Tonny Bollman, MD ATTENDING    Tonny Bollman, MD ORDERING     Tonny Bollman, MD REFERRING    Tonny Bollman, MD PERFORMING   Marca Ancona, M.D. SONOGRAPHER  Steven Chaco  cc:  -------------------------------------------------------------------  ------------------------------------------------------------------- Indications:     Aortic valve repair/replacement  ------------------------------------------------------------------- Impressions:  - Only post-TAVR images available. 1. Normal right ventricular size and systolic function. 2. Normal left ventricular size with mild LV hypertrophy. EF estimated 50%. 3. Well-seated bioprosthetic aortic valve s/p TAVR. Trivial peri-valvular regurgitation. No significant stenosis, mean gradient 6 mmHg. 4. Mild mitral regurgitation. 5. Trivial tricuspid regurgitation.  ------------------------------------------------------------------- Study data:   Study status:  Routine.  Procedure:  Transthoracic echocardiography. Image quality was adequate. Transthoracic echocardiography.  M-mode, complete 2D, spectral Doppler, and color Doppler.  Birthdate:  Patient birthdate: 13-Nov-1934.  Age:  Patient is 89 yr old.  Sex:  Gender: male. BMI: 31 kg/m^2.  Patient status:  Inpatient.  Study date:  Study date: 06/22/2017. Study time: 08:39  AM.  -------------------------------------------------------------------  -------------------------------------------------------------------  Aortic valve:   Doppler:     Mean gradient (S): 4 mm Hg. Peak gradient (S): 6 mm Hg.  ------------------------------------------------------------------- Pre bypass:  Post bypass:  ------------------------------------------------------------------- Measurements  Aortic valve                       Value Aortic valve peak velocity, S      127   cm/s Aortic valve mean velocity, S      94.4  cm/s Aortic valve VTI, S                25.6  cm Aortic mean gradient, S            4     mm Hg Aortic peak gradient, S            6     mm Hg  Legend: (L)  and  (H)  mark values outside specified reference range.  ------------------------------------------------------------------- Prepared and Electronically Authenticated by  Marca Ancona, M.D. 2018-07-03T16:12:30    CT SCANS  CT CORONARY MORPH W/CTA COR W/SCORE 06/11/2017  Addendum 06/11/2017  4:10 PM ADDENDUM REPORT: 06/11/2017 16:08  CLINICAL DATA:  89 year old male with severe aortic stenosis being evaluated for TAVR.  EXAM: Cardiac TAVR CT  TECHNIQUE: The patient was scanned on a Philips 256 scanner. A 120 kV retrospective scan was triggered in the descending thoracic aorta at 111 HU's. Gantry rotation speed was 270 msecs and collimation was .9 mm. 10 mg of iv Metoprolol and no nitro were given. The 3D data set was reconstructed in 5% intervals of the R-R cycle. Systolic and diastolic phases were analyzed on a dedicated work station using MPR, MIP and VRT modes. The patient received 80 cc of contrast.  FINDINGS: Aortic Valve: Trileaflet, moderately thickened and calcified aortic valve with severely restricted leaflet opening. No calcifications extending into the LVOT. LVOT is large.  Aorta:  Normal size, trivial calcifications.  No dissection.  Sinotubular Junction:  30 x 28  mm  Ascending Thoracic Aorta:  37 x 36 mm  Aortic Arch:  Not visualized  Descending Thoracic Aorta:  26 x 23 mm  Sinus of Valsalva Measurements:  Non-coronary:  34 mm  Right -coronary:  29 mm  Left -coronary:  34 mm  Coronary Artery Height above Annulus:  Left Main:  14 mm  Right Coronary:  13 mm  Virtual Basal Annulus Measurements:  Maximum/Minimum Diameter:  28 x 24 mm  Perimeter:  84 mm  Area:  535 mm2  Optimum Fluoroscopic Angle for Delivery:  LAO 0 CAU 0  IMPRESSION: 1. Trileaflet, moderately thickened and calcified aortic valve with severely restricted leaflet opening. No calcifications extending into the LVOT. LVOT is large and makes annular sizing challenging. Annular measurements suitable for a delivery of a 26 mm Edwards-SAPIEN 3 valve.  2.  Sufficient annulus to coronary distance.  3. Optimum Fluoroscopic Angle for Delivery:  LAO 0 CAU 0  4.  No thrombus in the left atrial appendage.  Tobias Alexander   Electronically Signed By: Tobias Alexander On: 06/11/2017 16:08  Narrative EXAM: OVER-READ INTERPRETATION  CT CHEST  The following report is an over-read performed by radiologist Dr. Noe Gens [facility] on 06/11/2017. This over-read does not include interpretation of cardiac or coronary anatomy or pathology. The coronary CTA interpretation by the cardiologist is attached.  COMPARISON:  06/03/2017  FINDINGS: Cardiovascular: Heart is normal size. Extensive calcifications throughout the aorta which is normal caliber. No dissection.  Mediastinum/Nodes: No adenopathy in the visualized lower mediastinum or hila. Calcified left hilar lymph nodes.  Lungs/Pleura: Visualized lungs are clear.  No effusions.  Upper Abdomen: Imaging into the upper abdomen shows no acute findings.  Musculoskeletal: No acute bony abnormality. Chest wall soft tissues unremarkable. Prior median sternotomy and CABG.  IMPRESSION: No acute or  significant extracardiac abnormality.  Electronically Signed: By: Charlett Nose M.D. On: 06/11/2017 14:41    PYP SCAN  MYOCARDIAL AMYLOID PLANAR AND SPECT 07/18/2021  Narrative  The study is normal.  1. H/CL ratio 1.1 at 1 hour and 1.2 at 3 hours. Visually this is Grade 0. 2. Overall, negative for TTR cardiac amyloidosis.         Physical Exam:    VS:  BP 122/60   Pulse 81   Ht 5\' 6"  (1.676 m)   Wt 165 lb 12.8 oz (75.2 kg)   SpO2 94%   BMI 26.76 kg/m    Wt Readings from Last 3 Encounters:  08/26/23 165 lb 12.8 oz (75.2 kg)  08/13/23 168 lb (76.2 kg)  07/12/23 168 lb (76.2 kg)    Gen: no distress, elderly male   Neck: No JVD Cardiac: No Rubs or Gallops, systolic murmur, RRR +2  radial pulses Respiratory: Clear to auscultation bilaterally, normal effort, normal  respiratory rate GI: Soft, nontender, non-distended  MS: trace edema;  moves all extremities Integument: Skin feels warm Neuro:  At time of evaluation, alert and oriented to person/place/time/situation  Psych: Tearful affect, patient feels ok  ASSESSMENT AND PLAN: .    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  He has a new decrease in his LVEF.  His and new infarct on  is planned.  I suspect his PDA is down; he has an upcoming surgery planned. - LDL < 70  Risks and benefits of cardiac catheterization have been discussed with the patient.  These include bleeding, infection, kidney damage, stroke, heart attack, death.  The patient understands these risks and is willing to proceed.  Access recommendations: L radial Procedural considerations He is asymptomatic of HF and infarction; if no high risk findings, will plan for 09/06/2023 surgery with Dr. Lorenso Courier, hold ASA prior.  If he has high risk anatomy and needs intervention,  his surgery will need to be cancelled.  Powers, [place] Callas, MD Neurological Surgery NPI: [id number] [address]  [place] [address]  Phone: [phone] Fax: [phone]    Heart Failure Reduced Ejection Fraction (combined systolic and diastolic) S/p TAVR and HTN - with CKD stage IV - chronic - NYHA class I, Stage C, euvolemic, etiology ischemic - Diuretic regimen: None- he is not aware of a true lasix allergy, continue torsemide 20 mg 3x week and keep his salt restriction - will start succinate 25 mg PO daily - post surgery PA/NP visit for GDMT titration  Dementia- family support - gave resources      Riley Lam, MD FASE [facility] Cardiologist [facility]  [address] [place], [address] [place], [place] [address] [phone]  10:45 AM

## 2023-08-27 LAB — BASIC METABOLIC PANEL
BUN/Creatinine Ratio: 13 (ref 10–24)
BUN: 28 mg/dL — ABNORMAL HIGH (ref 8–27)
CO2: 22 mmol/L (ref 20–29)
Calcium: 9.9 mg/dL (ref 8.6–10.2)
Chloride: 99 mmol/L (ref 96–106)
Creatinine, Ser: 2.12 mg/dL — ABNORMAL HIGH (ref 0.76–1.27)
Glucose: 140 mg/dL — ABNORMAL HIGH (ref 70–99)
Potassium: 5 mmol/L (ref 3.5–5.2)
Sodium: 136 mmol/L (ref 134–144)
eGFR: 30 mL/min/{1.73_m2} — ABNORMAL LOW (ref >59)

## 2023-08-27 LAB — CBC
Hematocrit: 35.8 % — ABNORMAL LOW (ref 37.5–51.0)
Hemoglobin: 11.7 g/dL — ABNORMAL LOW (ref 13.0–17.7)
MCH: 31.8 pg (ref 26.6–33.0)
MCHC: 32.7 g/dL (ref 31.5–35.7)
MCV: 97 fL (ref 79–97)
Platelets: 253 10*3/uL (ref 150–450)
RBC: 3.68 x10E6/uL — ABNORMAL LOW (ref 4.14–5.80)
RDW: 13.2 % (ref 11.6–15.4)
WBC: 8.6 10*3/uL (ref 3.4–10.8)

## 2023-08-31 ENCOUNTER — Telehealth: Payer: Self-pay | Admitting: *Deleted

## 2023-08-31 NOTE — Telephone Encounter (Signed)
Cardiac Catheterization scheduled at [facility] for: Wednesday September 01, 2023 11:30 AM Arrival time [facility] Main Entrance A at: 6:30 AM-pre-procedure hydration  Nothing to eat after midnight prior to procedure, clear liquids until 5 AM day of procedure.  Medication instructions: -Hold:  Glipizide-AM of procedure  Torsemide-pt reports he is not currently taking Torsemide -Other usual morning medications can be taken with sips of water including aspirin 81 mg.  Plan to go home the same day, you will only stay overnight if medically necessary.  You must have responsible adult to drive you home.  Someone must be with you the first 24 hours after you arrive home.  Reviewed procedure instructions/pre-procedure hydration with patient.

## 2023-09-01 ENCOUNTER — Other Ambulatory Visit: Payer: Self-pay

## 2023-09-01 ENCOUNTER — Ambulatory Visit (HOSPITAL_COMMUNITY)
Admission: RE | Disposition: A | Discharge status: Home / Self Care | Payer: Self-pay | Source: Home / Self Care | Attending: Interventional Cardiology

## 2023-09-01 ENCOUNTER — Ambulatory Visit (HOSPITAL_COMMUNITY)
Admission: RE | Admit: 2023-09-01 | Discharge: 2023-09-01 | Disposition: A | Discharge status: Home / Self Care | Payer: PPO | Attending: Interventional Cardiology | Admitting: Interventional Cardiology

## 2023-09-01 DIAGNOSIS — I253 Aneurysm of heart: Secondary | ICD-10-CM | POA: Insufficient documentation

## 2023-09-01 DIAGNOSIS — R9439 Abnormal result of other cardiovascular function study: Secondary | ICD-10-CM | POA: Diagnosis present

## 2023-09-01 DIAGNOSIS — I130 Hypertensive heart and chronic kidney disease with heart failure and stage 1 through stage 4 chronic kidney disease, or unspecified chronic kidney disease: Secondary | ICD-10-CM | POA: Diagnosis not present

## 2023-09-01 DIAGNOSIS — Z951 Presence of aortocoronary bypass graft: Secondary | ICD-10-CM | POA: Insufficient documentation

## 2023-09-01 DIAGNOSIS — E1122 Type 2 diabetes mellitus with diabetic chronic kidney disease: Secondary | ICD-10-CM | POA: Insufficient documentation

## 2023-09-01 DIAGNOSIS — E1140 Type 2 diabetes mellitus with diabetic neuropathy, unspecified: Secondary | ICD-10-CM | POA: Diagnosis not present

## 2023-09-01 DIAGNOSIS — N184 Chronic kidney disease, stage 4 (severe): Secondary | ICD-10-CM | POA: Insufficient documentation

## 2023-09-01 DIAGNOSIS — I2510 Atherosclerotic heart disease of native coronary artery without angina pectoris: Secondary | ICD-10-CM | POA: Diagnosis not present

## 2023-09-01 DIAGNOSIS — Z953 Presence of xenogenic heart valve: Secondary | ICD-10-CM | POA: Insufficient documentation

## 2023-09-01 DIAGNOSIS — Z905 Acquired absence of kidney: Secondary | ICD-10-CM | POA: Insufficient documentation

## 2023-09-01 DIAGNOSIS — I2582 Chronic total occlusion of coronary artery: Secondary | ICD-10-CM | POA: Diagnosis not present

## 2023-09-01 HISTORY — PX: LEFT HEART CATH AND CORS/GRAFTS ANGIOGRAPHY: CATH118250

## 2023-09-01 LAB — GLUCOSE, CAPILLARY
Glucose-Capillary: 144 mg/dL — ABNORMAL HIGH (ref 70–99)
Glucose-Capillary: 109 mg/dL — ABNORMAL HIGH (ref 70–99)

## 2023-09-01 SURGERY — LEFT HEART CATH AND CORS/GRAFTS ANGIOGRAPHY
Anesthesia: LOCAL

## 2023-09-01 MED ORDER — FENTANYL CITRATE (PF) 100 MCG/2ML IJ SOLN
INTRAMUSCULAR | Status: DC | PRN
  Administered 2023-09-01: 25 ug via INTRAVENOUS

## 2023-09-01 MED ORDER — IOHEXOL 350 MG/ML SOLN
INTRAVENOUS | Status: DC | PRN
  Administered 2023-09-01: 55 mL

## 2023-09-01 MED ORDER — FENTANYL CITRATE (PF) 100 MCG/2ML IJ SOLN
INTRAMUSCULAR | Status: AC
  Filled 2023-09-01: qty 2

## 2023-09-01 MED ORDER — ACETAMINOPHEN 325 MG PO TABS: 650.0000 mg | ORAL_TABLET | ORAL | Status: DC | PRN

## 2023-09-01 MED ORDER — LIDOCAINE HCL (PF) 1 % IJ SOLN
INTRAMUSCULAR | Status: AC
  Filled 2023-09-01: qty 30

## 2023-09-01 MED ORDER — ONDANSETRON HCL 4 MG/2ML IJ SOLN: 4.0000 mg | Freq: Four times a day (QID) | INTRAMUSCULAR | Status: DC | PRN

## 2023-09-01 MED ORDER — SODIUM CHLORIDE 0.9% FLUSH: 3.0000 mL | INTRAVENOUS | Status: DC | PRN

## 2023-09-01 MED ORDER — HYDROCODONE-ACETAMINOPHEN 10-325 MG PO TABS
2.0000 | ORAL_TABLET | Freq: Once | ORAL | Status: AC
  Administered 2023-09-01: 2 via ORAL
  Filled 2023-09-01: qty 2

## 2023-09-01 MED ORDER — SODIUM CHLORIDE 0.9 % IV SOLN: INTRAVENOUS | Status: DC

## 2023-09-01 MED ORDER — SODIUM CHLORIDE 0.9% FLUSH: 3.0000 mL | Freq: Two times a day (BID) | INTRAVENOUS | Status: DC

## 2023-09-01 MED ORDER — SODIUM CHLORIDE 0.9 % IV SOLN: INTRAVENOUS | Status: AC

## 2023-09-01 MED ORDER — LABETALOL HCL 5 MG/ML IV SOLN: 10.0000 mg | INTRAVENOUS | Status: DC | PRN

## 2023-09-01 MED ORDER — ASPIRIN 81 MG PO CHEW: 81.0000 mg | CHEWABLE_TABLET | ORAL | Status: DC

## 2023-09-01 MED ORDER — MIDAZOLAM HCL 2 MG/2ML IJ SOLN
INTRAMUSCULAR | Status: AC
  Filled 2023-09-01: qty 2

## 2023-09-01 MED ORDER — VERAPAMIL HCL 2.5 MG/ML IV SOLN
INTRAVENOUS | Status: AC
  Filled 2023-09-01: qty 2

## 2023-09-01 MED ORDER — LIDOCAINE HCL (PF) 1 % IJ SOLN
INTRAMUSCULAR | Status: DC | PRN
  Administered 2023-09-01: 15 mL

## 2023-09-01 MED ORDER — HEPARIN SODIUM (PORCINE) 1000 UNIT/ML IJ SOLN
INTRAMUSCULAR | Status: AC
  Filled 2023-09-01: qty 10

## 2023-09-01 MED ORDER — HEPARIN (PORCINE) IN NACL 1000-0.9 UT/500ML-% IV SOLN
INTRAVENOUS | Status: DC | PRN
  Administered 2023-09-01: 1000 mL

## 2023-09-01 MED ORDER — MIDAZOLAM HCL 2 MG/2ML IJ SOLN
INTRAMUSCULAR | Status: DC | PRN
  Administered 2023-09-01: 1 mg via INTRAVENOUS

## 2023-09-01 MED ORDER — ASPIRIN 81 MG PO CHEW
81.0000 mg | CHEWABLE_TABLET | ORAL | Status: AC
  Administered 2023-09-01: 81 mg via ORAL

## 2023-09-01 MED ORDER — SODIUM CHLORIDE 0.9 % IV SOLN: 250.0000 mL | INTRAVENOUS | Status: DC | PRN

## 2023-09-01 MED ORDER — HYDRALAZINE HCL 20 MG/ML IJ SOLN: 10.0000 mg | INTRAMUSCULAR | Status: DC | PRN

## 2023-09-01 SURGICAL SUPPLY — 10 items
CATH EXPO 5F MPA-1 (CATHETERS) IMPLANT
CATH INFINITI 5FR MULTPACK ANG (CATHETERS) IMPLANT
CLOSURE MYNX CONTROL 5F (Vascular Products) IMPLANT
KIT MICROPUNCTURE NIT STIFF (SHEATH) IMPLANT
PACK CARDIAC CATHETERIZATION (CUSTOM PROCEDURE TRAY) ×1 IMPLANT
SET ATX-X65L (MISCELLANEOUS) IMPLANT
SHEATH PINNACLE 5F 10CM (SHEATH) IMPLANT
SHEATH PROBE COVER 6X72 (BAG) IMPLANT
WIRE EMERALD 3MM-J .035X150CM (WIRE) IMPLANT
WIRE HI TORQ VERSACORE-J 145CM (WIRE) IMPLANT

## 2023-09-01 NOTE — Interval H&P Note (Signed)
Cath Lab Visit (complete for each Cath Lab visit)  Clinical Evaluation Leading to the Procedure:   ACS: No.  Non-ACS:    Anginal Classification: CCS II  Anti-ischemic medical therapy: Minimal Therapy (1 class of medications)  Non-Invasive Test Results: High-risk stress test findings: cardiac mortality >3%/year  Prior CABG: Previous CABG      History and Physical Interval Note:  09/01/2023 10:14 AM  [name]  has presented today for surgery, with the diagnosis of positive stress test.  The various methods of treatment have been discussed with the patient and family. After consideration of risks, benefits and other options for treatment, the patient has consented to  Procedure(s): LEFT HEART CATH AND CORS/GRAFTS ANGIOGRAPHY (N/A) as a surgical intervention.  The patient's history has been reviewed, patient examined, no change in status, stable for surgery.  I have reviewed the patient's chart and labs.  Questions were answered to the patient's satisfaction.     Lance Muss

## 2023-09-02 ENCOUNTER — Telehealth: Payer: Self-pay | Admitting: Internal Medicine

## 2023-09-02 ENCOUNTER — Encounter (HOSPITAL_COMMUNITY): Payer: Self-pay | Admitting: Interventional Cardiology

## 2023-09-02 MED FILL — Verapamil HCl IV Soln 2.5 MG/ML: INTRAVENOUS | Qty: 2 | Status: AC

## 2023-09-02 NOTE — Telephone Encounter (Signed)
Renita Papa, NP with [facility] pre op. Wants to f/u with MD in regards to [facility] from 09/01/23.  Pt scheduled to have surgery on Monday and would like MD input.  Advised will send to MD to f/u urgently.

## 2023-09-02 NOTE — Telephone Encounter (Signed)
Calling to speak with the dr about the patient upcoming surgery. Please advise

## 2023-09-02 NOTE — Telephone Encounter (Signed)
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 Still patent.  Asymptomatic planned for medical therapy. He is asymptomatic and optimized for upcoming knee surgery (see result note from [facility]).

## 2023-09-03 NOTE — Telephone Encounter (Signed)
Pt is calling to f/u on receiving a callback regarding his upcoming surgery on 9/16 since today is Friday he stated he needs to know something today. Please advise

## 2023-09-03 NOTE — Telephone Encounter (Signed)
Steven Papa, NP [facility] to f/u.  Left a message to call back.  Would like to f/u pt clearance for surgery.  Called pt advised MD was to reach out to NP last night with recommendation.   MD spoke with NP 09/02/23; addressed all concerns.

## 2023-09-23 ENCOUNTER — Inpatient Hospital Stay (HOSPITAL_COMMUNITY)
Admission: EM | Admit: 2023-09-23 | Discharge: 2023-09-30 | DRG: 871 | Disposition: A | Discharge status: Skilled Nursing Facility | Payer: PPO | Attending: Family Medicine | Admitting: Family Medicine

## 2023-09-23 ENCOUNTER — Encounter (HOSPITAL_COMMUNITY): Payer: Self-pay

## 2023-09-23 ENCOUNTER — Emergency Department (HOSPITAL_COMMUNITY): Payer: PPO

## 2023-09-23 ENCOUNTER — Other Ambulatory Visit: Payer: Self-pay

## 2023-09-23 DIAGNOSIS — A419 Sepsis, unspecified organism: Principal | ICD-10-CM

## 2023-09-23 DIAGNOSIS — Z79891 Long term (current) use of opiate analgesic: Secondary | ICD-10-CM

## 2023-09-23 DIAGNOSIS — I959 Hypotension, unspecified: Secondary | ICD-10-CM

## 2023-09-23 DIAGNOSIS — Z860101 Personal history of adenomatous and serrated colon polyps: Secondary | ICD-10-CM

## 2023-09-23 DIAGNOSIS — I130 Hypertensive heart and chronic kidney disease with heart failure and stage 1 through stage 4 chronic kidney disease, or unspecified chronic kidney disease: Secondary | ICD-10-CM | POA: Diagnosis present

## 2023-09-23 DIAGNOSIS — I2510 Atherosclerotic heart disease of native coronary artery without angina pectoris: Secondary | ICD-10-CM | POA: Diagnosis present

## 2023-09-23 DIAGNOSIS — Z888 Allergy status to other drugs, medicaments and biological substances status: Secondary | ICD-10-CM

## 2023-09-23 DIAGNOSIS — G9341 Metabolic encephalopathy: Secondary | ICD-10-CM | POA: Diagnosis present

## 2023-09-23 DIAGNOSIS — E1169 Type 2 diabetes mellitus with other specified complication: Secondary | ICD-10-CM | POA: Diagnosis present

## 2023-09-23 DIAGNOSIS — Z9841 Cataract extraction status, right eye: Secondary | ICD-10-CM

## 2023-09-23 DIAGNOSIS — E861 Hypovolemia: Secondary | ICD-10-CM | POA: Diagnosis not present

## 2023-09-23 DIAGNOSIS — Z951 Presence of aortocoronary bypass graft: Secondary | ICD-10-CM

## 2023-09-23 DIAGNOSIS — Z9842 Cataract extraction status, left eye: Secondary | ICD-10-CM

## 2023-09-23 DIAGNOSIS — Z880 Allergy status to penicillin: Secondary | ICD-10-CM

## 2023-09-23 DIAGNOSIS — I5042 Chronic combined systolic (congestive) and diastolic (congestive) heart failure: Secondary | ICD-10-CM | POA: Diagnosis present

## 2023-09-23 DIAGNOSIS — N2581 Secondary hyperparathyroidism of renal origin: Secondary | ICD-10-CM | POA: Diagnosis present

## 2023-09-23 DIAGNOSIS — Z85528 Personal history of other malignant neoplasm of kidney: Secondary | ICD-10-CM

## 2023-09-23 DIAGNOSIS — Z882 Allergy status to sulfonamides status: Secondary | ICD-10-CM

## 2023-09-23 DIAGNOSIS — R627 Adult failure to thrive: Secondary | ICD-10-CM | POA: Diagnosis present

## 2023-09-23 DIAGNOSIS — Z887 Allergy status to serum and vaccine status: Secondary | ICD-10-CM

## 2023-09-23 DIAGNOSIS — Y838 Other surgical procedures as the cause of abnormal reaction of the patient, or of later complication, without mention of misadventure at the time of the procedure: Secondary | ICD-10-CM | POA: Diagnosis present

## 2023-09-23 DIAGNOSIS — Z952 Presence of prosthetic heart valve: Secondary | ICD-10-CM

## 2023-09-23 DIAGNOSIS — E1140 Type 2 diabetes mellitus with diabetic neuropathy, unspecified: Secondary | ICD-10-CM | POA: Diagnosis present

## 2023-09-23 DIAGNOSIS — M1A9XX0 Chronic gout, unspecified, without tophus (tophi): Secondary | ICD-10-CM | POA: Diagnosis present

## 2023-09-23 DIAGNOSIS — I429 Cardiomyopathy, unspecified: Secondary | ICD-10-CM | POA: Diagnosis present

## 2023-09-23 DIAGNOSIS — Z7982 Long term (current) use of aspirin: Secondary | ICD-10-CM

## 2023-09-23 DIAGNOSIS — Z923 Personal history of irradiation: Secondary | ICD-10-CM

## 2023-09-23 DIAGNOSIS — Z7952 Long term (current) use of systemic steroids: Secondary | ICD-10-CM

## 2023-09-23 DIAGNOSIS — Z8546 Personal history of malignant neoplasm of prostate: Secondary | ICD-10-CM

## 2023-09-23 DIAGNOSIS — Z91013 Allergy to seafood: Secondary | ICD-10-CM

## 2023-09-23 DIAGNOSIS — Z961 Presence of intraocular lens: Secondary | ICD-10-CM | POA: Diagnosis present

## 2023-09-23 DIAGNOSIS — R531 Weakness: Secondary | ICD-10-CM

## 2023-09-23 DIAGNOSIS — M353 Polymyalgia rheumatica: Secondary | ICD-10-CM | POA: Diagnosis present

## 2023-09-23 DIAGNOSIS — K5900 Constipation, unspecified: Secondary | ICD-10-CM | POA: Diagnosis not present

## 2023-09-23 DIAGNOSIS — N400 Enlarged prostate without lower urinary tract symptoms: Secondary | ICD-10-CM | POA: Diagnosis present

## 2023-09-23 DIAGNOSIS — E1122 Type 2 diabetes mellitus with diabetic chronic kidney disease: Secondary | ICD-10-CM | POA: Diagnosis present

## 2023-09-23 DIAGNOSIS — Z8551 Personal history of malignant neoplasm of bladder: Secondary | ICD-10-CM

## 2023-09-23 DIAGNOSIS — D631 Anemia in chronic kidney disease: Secondary | ICD-10-CM | POA: Diagnosis present

## 2023-09-23 DIAGNOSIS — G8929 Other chronic pain: Secondary | ICD-10-CM | POA: Diagnosis present

## 2023-09-23 DIAGNOSIS — K219 Gastro-esophageal reflux disease without esophagitis: Secondary | ICD-10-CM | POA: Diagnosis present

## 2023-09-23 DIAGNOSIS — Z9861 Coronary angioplasty status: Secondary | ICD-10-CM

## 2023-09-23 DIAGNOSIS — E785 Hyperlipidemia, unspecified: Secondary | ICD-10-CM | POA: Diagnosis present

## 2023-09-23 DIAGNOSIS — Z981 Arthrodesis status: Secondary | ICD-10-CM

## 2023-09-23 DIAGNOSIS — N390 Urinary tract infection, site not specified: Secondary | ICD-10-CM | POA: Diagnosis present

## 2023-09-23 DIAGNOSIS — T8149XA Infection following a procedure, other surgical site, initial encounter: Secondary | ICD-10-CM

## 2023-09-23 DIAGNOSIS — Z905 Acquired absence of kidney: Secondary | ICD-10-CM

## 2023-09-23 DIAGNOSIS — T8141XA Infection following a procedure, superficial incisional surgical site, initial encounter: Secondary | ICD-10-CM | POA: Diagnosis present

## 2023-09-23 DIAGNOSIS — Z86718 Personal history of other venous thrombosis and embolism: Secondary | ICD-10-CM

## 2023-09-23 DIAGNOSIS — Z87891 Personal history of nicotine dependence: Secondary | ICD-10-CM

## 2023-09-23 DIAGNOSIS — N184 Chronic kidney disease, stage 4 (severe): Secondary | ICD-10-CM | POA: Diagnosis present

## 2023-09-23 DIAGNOSIS — Z79899 Other long term (current) drug therapy: Secondary | ICD-10-CM

## 2023-09-23 DIAGNOSIS — Z6826 Body mass index (BMI) 26.0-26.9, adult: Secondary | ICD-10-CM

## 2023-09-23 DIAGNOSIS — Z7984 Long term (current) use of oral hypoglycemic drugs: Secondary | ICD-10-CM

## 2023-09-23 DIAGNOSIS — E860 Dehydration: Secondary | ICD-10-CM | POA: Diagnosis present

## 2023-09-23 LAB — BASIC METABOLIC PANEL
Anion gap: 10 (ref 5–15)
BUN: 33 mg/dL — ABNORMAL HIGH (ref 8–23)
CO2: 26 mmol/L (ref 22–32)
Calcium: 9.4 mg/dL (ref 8.9–10.3)
Chloride: 100 mmol/L (ref 98–111)
Creatinine, Ser: 1.74 mg/dL — ABNORMAL HIGH (ref 0.61–1.24)
GFR, Estimated: 37 mL/min — ABNORMAL LOW (ref >60)
Glucose, Bld: 198 mg/dL — ABNORMAL HIGH (ref 70–99)
Potassium: 4.6 mmol/L (ref 3.5–5.1)
Sodium: 136 mmol/L (ref 135–145)

## 2023-09-23 LAB — CBC WITH DIFFERENTIAL/PLATELET
Abs Immature Granulocytes: 0.07 10*3/uL (ref 0.00–0.07)
Basophils Absolute: 0.1 10*3/uL (ref 0.0–0.1)
Basophils Relative: 1 %
Eosinophils Absolute: 0.2 10*3/uL (ref 0.0–0.5)
Eosinophils Relative: 1 %
HCT: 32.4 % — ABNORMAL LOW (ref 39.0–52.0)
Hemoglobin: 10 g/dL — ABNORMAL LOW (ref 13.0–17.0)
Immature Granulocytes: 1 %
Lymphocytes Relative: 14 %
Lymphs Abs: 2 10*3/uL (ref 0.7–4.0)
MCH: 31.3 pg (ref 26.0–34.0)
MCHC: 30.9 g/dL (ref 30.0–36.0)
MCV: 101.6 fL — ABNORMAL HIGH (ref 80.0–100.0)
Monocytes Absolute: 1.1 10*3/uL — ABNORMAL HIGH (ref 0.1–1.0)
Monocytes Relative: 8 %
Neutro Abs: 10.6 10*3/uL — ABNORMAL HIGH (ref 1.7–7.7)
Neutrophils Relative %: 75 %
Platelets: 475 10*3/uL — ABNORMAL HIGH (ref 150–400)
RBC: 3.19 MIL/uL — ABNORMAL LOW (ref 4.22–5.81)
RDW: 13.6 % (ref 11.5–15.5)
WBC: 14 10*3/uL — ABNORMAL HIGH (ref 4.0–10.5)
nRBC: 0 % (ref 0.0–0.2)

## 2023-09-23 LAB — I-STAT CG4 LACTIC ACID, ED
Lactic Acid, Venous: 1.4 mmol/L (ref 0.5–1.9)
Lactic Acid, Venous: 1.2 mmol/L (ref 0.5–1.9)

## 2023-09-23 MED ORDER — MORPHINE SULFATE (PF) 2 MG/ML IV SOLN
2.0000 mg | Freq: Once | INTRAVENOUS | Status: AC
  Administered 2023-09-23: 2 mg via INTRAVENOUS
  Filled 2023-09-23: qty 1

## 2023-09-23 MED ORDER — SODIUM CHLORIDE 0.9 % IV SOLN
2.0000 g | Freq: Once | INTRAVENOUS | Status: AC
  Administered 2023-09-23: 2 g via INTRAVENOUS
  Filled 2023-09-23: qty 12.5

## 2023-09-23 MED ORDER — LACTATED RINGERS IV BOLUS
1000.0000 mL | Freq: Once | INTRAVENOUS | Status: AC
  Administered 2023-09-23: 1000 mL via INTRAVENOUS

## 2023-09-23 MED ORDER — LACTATED RINGERS IV SOLN: INTRAVENOUS | Status: AC

## 2023-09-23 MED ORDER — SODIUM CHLORIDE 0.9 % IV BOLUS
500.0000 mL | Freq: Once | INTRAVENOUS | Status: AC
  Administered 2023-09-23: 500 mL via INTRAVENOUS

## 2023-09-23 MED ORDER — ACETAMINOPHEN 325 MG PO TABS
650.0000 mg | ORAL_TABLET | Freq: Once | ORAL | Status: AC
  Administered 2023-09-23: 650 mg via ORAL
  Filled 2023-09-23: qty 2

## 2023-09-23 MED ORDER — VANCOMYCIN HCL 1500 MG/300ML IV SOLN
1500.0000 mg | Freq: Once | INTRAVENOUS | Status: AC
  Administered 2023-09-24: 1500 mg via INTRAVENOUS
  Filled 2023-09-23: qty 300

## 2023-09-23 MED ORDER — ONDANSETRON HCL 4 MG/2ML IJ SOLN
4.0000 mg | Freq: Once | INTRAMUSCULAR | Status: AC
  Administered 2023-09-23: 4 mg via INTRAVENOUS
  Filled 2023-09-23: qty 2

## 2023-09-23 MED ORDER — MORPHINE SULFATE (PF) 4 MG/ML IV SOLN
4.0000 mg | Freq: Once | INTRAVENOUS | Status: AC
  Administered 2023-09-23: 4 mg via INTRAVENOUS
  Filled 2023-09-23: qty 1

## 2023-09-23 MED ORDER — VANCOMYCIN HCL IN DEXTROSE 1-5 GM/200ML-% IV SOLN: 1000.0000 mg | Freq: Once | INTRAVENOUS | Status: DC

## 2023-09-23 NOTE — ED Notes (Signed)
Pt gone to MRI 

## 2023-09-23 NOTE — H&P (Signed)
History and Physical    Patient: [name] MVH:[id number] DOB: [birth year] DOA: 09/23/2023 DOS: the patient was seen and examined on 09/24/2023 PCP: Emilio Aspen, MD  Patient coming from: Home  Chief Complaint:  Chief Complaint  Patient presents with   Failure To Thrive   HPI: [name] is a 89 y.o. male with medical history significant of hx of prostate cancer s/p radiation, CKD stage IV secondary to RCC s/p partial L nephrectomy, HTN, HLD, CAD s/p CABG (2007), T2DM, chronic systolic and diastolic heart failure, and TAVR 06/2018 who presents with generalized weakness and decrease oral intake.  Pt has degenerative scoliosis and was admitted for elective lumbar stabilization with L2-4 lumbar spinal fusion on 9/16 at [facility]. He refused rehab at discharge and since being home he has not be able to eat or drink much for at least 10 days. Has continuous back pain. No abdominal pain. No nausea, vomiting or diarrhea. He finally decided to start home PT and today PT came and prompted him to present to ED.   He was noted to be hypotensive with SBP in the 80s with EMS. Improved with 500cc of IV fluid. In ED he was febrile to 100.24F, tachycardic HR 113 and normotensive on room air.   Has leukocytosis of 14K, hgb of 10, platelet of 475. Lactic within normal limits.   BMP is unremarkable with creatinine about baseline.   MRI L-spine with new right foraminal protrusion with annular fissure at L5-S1 with mild right neural foraminal stenosis. No findings of acute infection or abscess.   EDP Dr. Rhae Hammock consulted neurosurgery who will see in consultation in the morning but agreed to pt being admitted to [facility]. Hospitalist then consulted for further workup and management of possible sepsis with unclear source.   Review of Systems: As mentioned in the history of present illness. All other systems reviewed and are negative. Past Medical History:  Diagnosis Date   Anemia due to  chronic kidney disease    Benign localized prostatic hyperplasia with lower urinary tract symptoms (LUTS)    Bladder cancer (HCC) 04/2023   dr dahlstedt   Carpal tunnel syndrome on both sides    Chronic constipation    Chronic gout without tophus    followed by pcp    (05-07-2023  per pt last flare-up 6-8 months ago, usually affects bilateral great toes)   Chronic low back pain    per pt s/p RFA's   Chronic narcotic use    Chronic systolic CHF (congestive heart failure) (HCC)    followed by cardiology;  preserved ef   CKD (chronic kidney disease), stage III (HCC) 2017   previously seen by nephrologsit--- dr Hyman Hopes   Coronary artery disease 1993   cardiologist--- dr h. Katrinka Blazing;   1993-- s/p cath w/ PTCA to occluded RCA;  a. 2007: s/p CABG x4 (LIMA--> LAD, rSVG--> RI, rSVG-seq -->dRCA and PLA))  c. 2017: LHC with 2/3 patent bypass grafts with occuluded SVG--> RI   DDD (degenerative disc disease), lumbosacral    GERD (gastroesophageal reflux disease)    History of adenomatous polyp of colon    History of cardiomyopathy 09/2016   in setting severe aortic stenosis and new acute on chronic systolic CHF, ef 84-13%   History of DVT of lower extremity 2008   superficial thrombophlebitis-- R Lower leg, right upper leg   History of polymyalgia rheumatica 2011   per pt hx was treated and resolved after being dx approx 2011  History of prostate cancer 2009   urologist--- dr Retta Diones;   dx 2009,  Gleason 4+3;   02-08-2009  s/p radioactive prostate seed implants by dr Vonita Moss  (05-07-2023  PSA undetectable)   History of renal cell cancer 2014   primary urologist-- dr Frederich Cha;   incidental finding on imaging for back , left renal mass;   08-31-2013  s/p partial left nephrecotmy   History of septic shock 2016   post op  lumbar back surgery, positive blood culture  (discitis/ ostomyelitis)   Hyperlipidemia    Hypertension    Peripheral neuropathy    hands and feet   Pulmonary nodule, right     solitary ,  last Chest CT in epic 12-08-2018 stable   S/P aortic valve replacement with prosthetic valve 07/02/2017   @[facility]  by Dr Laneta Simmers w/ Stephani Police prosthesis  for severe aortic valve stenosis  (nonrheumatic)   Secondary hyperparathyroidism of renal origin (HCC)    Type 2 diabetes mellitus (HCC)    followed by pcp;   (05-07-2023  per pt only check blood sugar occasionlly)   Wears partial dentures    upper   Past Surgical History:  Procedure Laterality Date   APPENDECTOMY     child   CARDIAC CATHETERIZATION N/A 11/03/2016   Procedure: Right/Left Heart Cath and Coronary/Graft Angiography;  Surgeon: Lyn Records, MD;  Location: [facility] INVASIVE CV LAB;  Service: Cardiovascular;  Laterality: N/A;   CARDIAC CATHETERIZATION  03/30/2006   @[facility]  by dr h. Katrinka Blazing;   severe coronary artery disease   CATARACT EXTRACTION W/ INTRAOCULAR LENS IMPLANT Bilateral    2015/ 2019   CORONARY ANGIOPLASTY  1993   @[facility]  by dr h. Katrinka Blazing;   PTCA to total occluded RCA   CORONARY ARTERY BYPASS GRAFT  03/31/2006   @[facility]  by dr gerhardt;   x4--  LIMA--LAD/  rSVG--RI/   rSVG seq -- dRCA and PLB of RCA   INSERTION PROSTATE RADIATION SEED  02/08/2009   @[facility]  by dr Vonita Moss;   radioactive prostate seed implants for prostate cancer   LEFT HEART CATH AND CORS/GRAFTS ANGIOGRAPHY N/A 09/01/2023   Procedure: LEFT HEART CATH AND CORS/GRAFTS ANGIOGRAPHY;  Surgeon: Corky Crafts, MD;  Location: [facility] INVASIVE CV LAB;  Service: Cardiovascular;  Laterality: N/A;   LUMBAR SPINE SURGERY  10/02/2015   dr Dutch Quint;   L3--L5   ROBOTIC ASSITED PARTIAL NEPHRECTOMY Left 08/31/2013   Procedure: ROBOTIC ASSITED PARTIAL NEPHRECTOMY;  Surgeon: Crecencio Mc, MD;  Location: [facility] ORS;  Service: Urology;  Laterality: Left;   TEE WITHOUT CARDIOVERSION N/A 06/22/2017   Procedure: TRANSESOPHAGEAL ECHOCARDIOGRAM (TEE);  Surgeon: Tonny Bollman, MD;  Location: [facility] OR;  Service: Open Heart Surgery;  Laterality: N/A;   TRANSCATHETER AORTIC VALVE REPLACEMENT,  TRANSFEMORAL N/A 06/22/2017   Procedure: TRANSCATHETER AORTIC VALVE REPLACEMENT, TRANSFEMORAL;  Surgeon: Tonny Bollman, MD;  Location: [facility] OR;  Service: Open Heart Surgery;  Laterality: N/A;   VEIN LIGATION AND STRIPPING Right 1972   right lower leg   Social History:  reports that he quit smoking about 42 years ago. His smoking use included cigarettes. He started smoking about 72 years ago. He has a 90 pack-year smoking history. He has never used smokeless tobacco. He reports that he does not drink alcohol and does not use drugs.  Allergies  Allergen Reactions   Fish Allergy Swelling    Dark fish- salmon, tuna    Lipitor [Atorvastatin] Other (See Comments)    Muscle pain   Penicillins Swelling  SWELLING OF THE FEET  Has patient had a PCN reaction causing immediate rash, facial/tongue/throat swelling, SOB or lightheadedness with hypotension: no Has patient had a PCN reaction causing severe rash involving mucus membranes or skin necrosis: no Has patient had a PCN reaction that required hospitalization no Has patient had a PCN reaction occurring within the last 10 years: no If all of the above answers are "NO", then may proceed with Cephalosporin use.   Simvastatin Other (See Comments)    MUSCLE PAIN   Adacel [Tetanus-Diphth-Acell Pertussis]     UNSPECIFIED REACTION    Lasix [Furosemide] Swelling    SWELLING REACTION UNSPECIFIED    Septra [Sulfamethoxazole-Trimethoprim]     UNSPECIFIED REACTION    Flexeril [Cyclobenzaprine] Other (See Comments)    Causes confusion   Pantoprazole Diarrhea    Family History  Problem Relation Age of Onset   Heart Problems Mother    Heart Problems Father    Neuropathy Neg Hx     Prior to Admission medications   Medication Sig Start Date End Date Taking? Authorizing Provider  aspirin 81 MG chewable tablet Chew 81 mg by mouth every evening.    [provider]  Ca Carbonate-Mag Hydroxide (ROLAIDS PO) Take by mouth as needed (reflux).     [provider]  cefdinir (OMNICEF) 300 MG capsule 2 (two) times daily.    [provider]  Cholecalciferol (VITAMIN D-3) 125 MCG (5000 UT) TABS Take 5,000 Units by mouth daily.    [provider]  glipiZIDE (GLUCOTROL) 10 MG tablet Take 5 mg by mouth 2 (two) times daily before a meal.    [provider]  HYDROcodone-acetaminophen (NORCO) 10-325 MG tablet Take 1 tablet by mouth every 4 (four) hours. Per pt on regular basis for chronic pain for severe back pain    [provider]  lidocaine (LIDODERM) 5 % Place 1 patch onto the skin as needed. Remove & Discard patch within 12 hours or as directed by MD    [provider]  metoprolol succinate (TOPROL XL) 25 MG 24 hr tablet Take 1 tablet (25 mg total) by mouth daily. 08/26/23   Christell Constant, MD  Multiple Vitamin (MULTIVITAMIN) tablet Take 1 tablet by mouth at bedtime.    [provider]  nitroGLYCERIN (NITROSTAT) 0.4 MG SL tablet Place 1 tablet (0.4 mg total) under the tongue every 5 (five) minutes as needed for chest pain. 08/26/23   Christell Constant, MD  OVER THE COUNTER MEDICATION Apply topically 4 (four) times daily as needed. Neuro Soothe Cream for nerve pain , hands/ feet    [provider]  OVER THE COUNTER MEDICATION Apply 1 application  topically daily as needed (Back pain). CBD Roll on    [provider]  OVER THE COUNTER MEDICATION Take 8 oz by mouth 2 (two) times daily. Blueprint to Nutrition Mix   Vaspro Plus, Greenberry and Inflamm with water and crystal light packets    [provider]  OVER THE COUNTER MEDICATION Take 1 tablet by mouth at bedtime as needed (Sleep). Relaxium sleep    [provider]  predniSONE (DELTASONE) 10 MG tablet Take 10 mg by mouth daily.    [provider]  Propylene Glycol (SYSTANE COMPLETE) 0.6 % SOLN Place 1 drop into the left eye 2 (two) times daily.    [provider]  psyllium  (HYDROCIL/METAMUCIL) 95 % PACK Take 1 packet by mouth daily as needed for mild constipation.    [provider]  sulfamethoxazole-trimethoprim (BACTRIM DS) 800-160 MG tablet Take 1 tablet by mouth 2 (two) times daily. 08/21/23   [provider]  tamsulosin (FLOMAX) 0.4 MG CAPS capsule Take 0.4 mg by mouth 2 (two) times daily.    [provider]  torsemide (DEMADEX) 20 MG tablet TAKE 1 TABLET BY MOUTH EVERY MONDAY, WEDNESDAY, AND FRIDAY Patient taking differently: Take 20 mg by mouth daily as needed. 11/30/22   Lyn Records, MD    Physical Exam: Vitals:   09/23/23 2200 09/23/23 2230 09/23/23 2235 09/24/23 0016  BP: (!) 104/58  112/62 104/60  Pulse:  (!) 113 (!) 106 (!) 107  Resp: (!) 24 (!) 24 20 (!) 21  Temp:    98.9 F (37.2 C)  TempSrc:    Oral  SpO2: 96% 93% 96% 95%  Weight:      Height:       Constitutional: NAD, calm, comfortable, chronically ill appearing elderly male laying at approximately 40 degree incline in bed Eyes: lids and conjunctivae normal ENMT: Mucous membranes are moist.  Neck: normal, supple Respiratory: clear to auscultation bilaterally, no wheezing, no crackles. Normal respiratory effort. No accessory muscle use.  Cardiovascular: Regular rate and rhythm, no murmurs / rubs / gallops. No extremity edema. Abdomen: soft, no tenderness, Bowel sounds positive.  Musculoskeletal: no clubbing / cyanosis. No joint deformity upper and lower extremities. Good ROM, no contractures. Normal muscle tone.  Skin: Mid line lumbar incision wound with granulation tissue, surrounding erythema and increase warm to touch. No malodor or drainage of fluid.  Neurologic: CN 2-12 grossly intact.  Psychiatric: Normal judgment and insight. Alert and oriented x 3. Tearful mood at the end of encounter.   Data Reviewed:  See HPI  Assessment and Plan: * Sepsis secondary to UTI [facility]) -presented with fever, tachycardia and leukocytosis with grossly positive UA -start  IV Rocephin. Has received IV cefepime in ED.  -Urine culture pending -continue IV fluid  Wound cellulitis after surgery -appears to have mild cellulitis to lumbar spinal incision wound s/p lumbar fusion on 9/16 at [facility] -continue IV vancomycin and Rocephin  -wound care consulted  -neurosurgery consulted and will evaluate. MRI L-spine did not have signs of infection or abscess.  Hypotension -SBP noted to be 80 on arrival. Hold home antihypertensives and keep on continuous IV fluid  S/P CABG (coronary artery bypass graft) S/p TAVR -Continue aspirn   Chronic combined systolic and diastolic heart failure (HCC) Euvolemic on exam -pt not able to recall his medication and will provide list tomorrow  Hyperlipidemia associated with type 2 diabetes mellitus (HCC) -Not able to verify if he is on statin. pt to provide medication list tomorrow.      Advance Care Planning: Full  Consults: neurosurgery  Family Communication: wife at bedside  Severity of Illness: The appropriate patient status for this patient is INPATIENT. Inpatient status is judged to be reasonable and necessary in order to provide the required intensity of service to ensure the patient's safety. The patient's presenting symptoms, physical exam findings, and initial radiographic and laboratory data in the context of their chronic comorbidities is felt to place them at high risk for further clinical deterioration. Furthermore, it is not anticipated that the patient will be medically stable for discharge from the hospital within 2 midnights of admission.   * I certify that at the point of admission it is my clinical judgment that the patient will require inpatient hospital care spanning beyond 2 midnights from the point  of admission due to high intensity of service, high risk for further deterioration and high frequency of surveillance required.*  Author: Anselm Jungling, DO 09/24/2023 12:49 AM  For on call review  [email or web address].

## 2023-09-23 NOTE — ED Notes (Signed)
Wife refused in and out cath/ pt used urinal

## 2023-09-23 NOTE — Sepsis Progress Note (Signed)
Elink monitoring for the code sepsis protocol.  

## 2023-09-23 NOTE — ED Provider Notes (Signed)
[place] EMERGENCY DEPARTMENT AT [facility] Provider Note   CSN: [id number] Arrival date & time: 09/23/23  1504     History  Chief Complaint  Patient presents with   Failure To Thrive    CALAN DOREN is a 89 y.o. male.  89 year old male with recent back surgery presenting to the emergency department today with concern from his physical therapist for failure to thrive.  The patient states that he has been having worsening low back pain since the surgery.  He reports that he has had nausea and has not had much of an appetite.  He denies any abdominal pain or cough.  He states that he has had a difficult time participating with home physical therapy.  He was sent in today for further evaluation and they mention that he should probably have inpatient therapy.  The patient denies any fevers.  Denies any bowel or bladder dysfunction or saddle anesthesia.        Home Medications Prior to Admission medications   Medication Sig Start Date End Date Taking? Authorizing Provider  aspirin 81 MG chewable tablet Chew 81 mg by mouth every evening.    [provider]  Ca Carbonate-Mag Hydroxide (ROLAIDS PO) Take by mouth as needed (reflux).    [provider]  cefdinir (OMNICEF) 300 MG capsule 2 (two) times daily.    [provider]  Cholecalciferol (VITAMIN D-3) 125 MCG (5000 UT) TABS Take 5,000 Units by mouth daily.    [provider]  glipiZIDE (GLUCOTROL) 10 MG tablet Take 5 mg by mouth 2 (two) times daily before a meal.    [provider]  HYDROcodone-acetaminophen (NORCO) 10-325 MG tablet Take 1 tablet by mouth every 4 (four) hours. Per pt on regular basis for chronic pain for severe back pain    [provider]  lidocaine (LIDODERM) 5 % Place 1 patch onto the skin as needed. Remove & Discard patch within 12 hours or as directed by MD    [provider]  metoprolol succinate (TOPROL XL) 25 MG 24 hr tablet Take 1  tablet (25 mg total) by mouth daily. 08/26/23   Christell Constant, MD  Multiple Vitamin (MULTIVITAMIN) tablet Take 1 tablet by mouth at bedtime.    [provider]  nitroGLYCERIN (NITROSTAT) 0.4 MG SL tablet Place 1 tablet (0.4 mg total) under the tongue every 5 (five) minutes as needed for chest pain. 08/26/23   Christell Constant, MD  OVER THE COUNTER MEDICATION Apply topically 4 (four) times daily as needed. Neuro Soothe Cream for nerve pain , hands/ feet    [provider]  OVER THE COUNTER MEDICATION Apply 1 application  topically daily as needed (Back pain). CBD Roll on    [provider]  OVER THE COUNTER MEDICATION Take 8 oz by mouth 2 (two) times daily. Blueprint to Nutrition Mix   Vaspro Plus, Greenberry and Inflamm with water and crystal light packets    [provider]  OVER THE COUNTER MEDICATION Take 1 tablet by mouth at bedtime as needed (Sleep). Relaxium sleep    [provider]  predniSONE (DELTASONE) 10 MG tablet Take 10 mg by mouth daily.    [provider]  Propylene Glycol (SYSTANE COMPLETE) 0.6 % SOLN Place 1 drop into the left eye 2 (two) times daily.    [provider]  psyllium (HYDROCIL/METAMUCIL) 95 % PACK Take 1 packet by mouth daily as needed for mild constipation.  [provider]  sulfamethoxazole-trimethoprim (BACTRIM DS) 800-160 MG tablet Take 1 tablet by mouth 2 (two) times daily. 08/21/23   [provider]  tamsulosin (FLOMAX) 0.4 MG CAPS capsule Take 0.4 mg by mouth 2 (two) times daily.    [provider]  torsemide (DEMADEX) 20 MG tablet TAKE 1 TABLET BY MOUTH EVERY MONDAY, WEDNESDAY, AND FRIDAY Patient taking differently: Take 20 mg by mouth daily as needed. 11/30/22   Lyn Records, MD      Allergies    Fish allergy, Lipitor [atorvastatin], Penicillins, Simvastatin, Adacel [tetanus-diphth-acell pertussis], Lasix [furosemide], Septra  [sulfamethoxazole-trimethoprim], Flexeril [cyclobenzaprine], and Pantoprazole    Review of Systems   Review of Systems  Gastrointestinal:  Positive for nausea.  Musculoskeletal:  Positive for back pain and gait problem.  All other systems reviewed and are negative.   Physical Exam Updated Vital Signs BP 112/62   Pulse (!) 106   Temp (!) 100.6 F (38.1 C)   Resp 20   Ht 5' 6.5" (1.689 m)   Wt 74.8 kg   SpO2 96%   BMI 26.23 kg/m  Physical Exam Vitals and nursing note reviewed.   Gen: NAD Eyes: PERRL, EOMI HEENT: Dry mucous membranes Neck: trachea midline Resp: clear to auscultation bilaterally Card: Tachycardic no murmurs, rubs, or gallops Abd: nontender, nondistended Extremities: no calf tenderness, no edema MSK: The patient's surgical scar over his lumbar spine is clean, dry, and intact, there is some mild overlying erythema but the patient is lying on his back and this goes past the surgical site as well which could be how he is laying.  No dehiscence.  No drainage. Vascular: 2+ radial pulses bilaterally, 2+ DP pulses bilaterally Neuro: The patient has equal strength and sensation throughout the bilateral lower extremities Skin: no rashes Psyc: acting appropriately   ED Results / Procedures / Treatments   Labs (all labs ordered are listed, but only abnormal results are displayed) Labs Reviewed  BASIC METABOLIC PANEL - Abnormal; Notable for the following components:      Result Value   Glucose, Bld 198 (*)    BUN 33 (*)    Creatinine, Ser 1.74 (*)    GFR, Estimated 37 (*)    All other components within normal limits  CBC WITH DIFFERENTIAL/PLATELET - Abnormal; Notable for the following components:   WBC 14.0 (*)    RBC 3.19 (*)    Hemoglobin 10.0 (*)    HCT 32.4 (*)    MCV 101.6 (*)    Platelets 475 (*)    Neutro Abs 10.6 (*)    Monocytes Absolute 1.1 (*)    All other components within normal limits  URINALYSIS, ROUTINE W REFLEX MICROSCOPIC - Abnormal;  Notable for the following components:   Hgb urine dipstick MODERATE (*)    Ketones, ur 5 (*)    Protein, ur 30 (*)    Nitrite POSITIVE (*)    Leukocytes,Ua MODERATE (*)    Bacteria, UA FEW (*)    All other components within normal limits  CULTURE, BLOOD (ROUTINE X 2)  CULTURE, BLOOD (ROUTINE X 2)  I-STAT CG4 LACTIC ACID, ED  I-STAT CG4 LACTIC ACID, ED  I-STAT CG4 LACTIC ACID, ED  I-STAT CG4 LACTIC ACID, ED    EKG None  Radiology MR LUMBAR SPINE WO CONTRAST  Result Date: 09/23/2023 CLINICAL DATA:  Low back pain EXAM: MRI LUMBAR SPINE WITHOUT CONTRAST TECHNIQUE: Multiplanar, multisequence MR imaging of the lumbar spine was performed. No intravenous contrast was administered.  COMPARISON:  12/18/2016 FINDINGS: Segmentation:  Standard Alignment:  Grade 1 anterolisthesis at L3-4 Vertebrae: L1-4 posterior instrumented fusion. No acute abnormality. Conus medullaris and cauda equina: Conus extends to the L2 level. Conus and cauda equina appear normal. Paraspinal and other soft tissues: Negative Disc levels: T12-L1: Unremarkable. L1-L2: Postfusion changes. Unchanged small disc bulge with endplate spurring. No spinal canal stenosis. No neural foraminal stenosis. L2-L3: Postfusion changes. Unchanged right asymmetric disc bulge. Narrowing of both lateral recesses without central spinal canal stenosis. Mild right neural foraminal stenosis. L3-L4: Postfusion changes. Small disc bulge with endplate spurring, unchanged. No spinal canal stenosis. Unchanged moderate right and mild left neural foraminal stenosis. L4-L5: Disc space narrowing with endplate spurring. No spinal canal stenosis. Unchanged severe left neural foraminal stenosis. L5-S1: Moderate facet hypertrophy and small disc bulge. There is a new right foraminal protrusion with annular fissure. No spinal canal stenosis. Mild right neural foraminal stenosis. Visualized sacrum: Normal. IMPRESSION: 1. New right foraminal protrusion with annular fissure at  L5-S1 with mild right neural foraminal stenosis. 2. Unchanged severe left L4-5 neural foraminal stenosis. 3. Unchanged moderate right L3-4 neural foraminal stenosis. 4. L1-4 posterior instrumented fusion without residual spinal canal stenosis. Electronically Signed   By: Deatra Robinson M.D.   On: 09/23/2023 23:31   DG Chest Portable 1 View  Result Date: 09/23/2023 CLINICAL DATA:  Back surgery on 09/16. Failure to thrive since. Feels dizzy. EXAM: PORTABLE CHEST 1 VIEW COMPARISON:  Chest radiographs 05/27/2023 FINDINGS: Sternotomy. CABG. TAVR. Aortic atherosclerotic calcification. Stable cardiomediastinal silhouette. Low lung volumes accentuate pulmonary vascularity. No focal consolidation, pleural effusion, or pneumothorax. No displaced rib fractures. IMPRESSION: No active disease. Electronically Signed   By: Minerva Fester M.D.   On: 09/23/2023 22:15    Procedures Procedures    Medications Ordered in ED Medications  lactated ringers infusion ( Intravenous New Bag/Given 09/23/23 2247)  vancomycin (VANCOREADY) IVPB 1500 mg/300 mL (has no administration in time range)  lactated ringers bolus 1,000 mL (0 mLs Intravenous Stopped 09/23/23 1835)  ondansetron (ZOFRAN) injection 4 mg (4 mg Intravenous Given 09/23/23 1639)  morphine (PF) 2 MG/ML injection 2 mg (2 mg Intravenous Given 09/23/23 1636)  morphine (PF) 2 MG/ML injection 2 mg (2 mg Intravenous Given 09/23/23 1924)  sodium chloride 0.9 % bolus 500 mL (0 mLs Intravenous Stopped 09/23/23 2151)  morphine (PF) 4 MG/ML injection 4 mg (4 mg Intravenous Given 09/23/23 2103)  ondansetron (ZOFRAN) injection 4 mg (4 mg Intravenous Given 09/23/23 2103)  acetaminophen (TYLENOL) tablet 650 mg (650 mg Oral Given 09/23/23 2228)  ceFEPIme (MAXIPIME) 2 g in sodium chloride 0.9 % 100 mL IVPB (2 g Intravenous New Bag/Given 09/23/23 2247)    ED Course/ Medical Decision Making/ A&P                                 Medical Decision Making 89 year old male with recent  surgery on his lumbar spine presents emergency department today with worsening back pain and failure to thrive.  The patient is very dry appearing here on exam.  He is tachycardic here in the room.  This may all be due to dehydration but will further evaluate him here with basic labs Wels a lactic acid to screen for possible sepsis although given his history this could all be from decreased appetite and dehydration.  I will obtain an MRI to evaluate for possible infection given his worsening pain.  I will give him  IV fluids, morphine, and Zofran for symptoms and reevaluate for ultimate disposition.  If his workup is unremarkable and he is feeling better we will see about a transitions of care observation admission to have him referred to a SNF.  The patient did have a leukocytosis with normal lactate originally. The patient denies any symptoms consistent with infection.  Given the leukocytosis I did order an MRI to evaluate for possible infection.  At 2151 the patient did have a temperature of 100.6.  The patient is covered with broad-spectrum antibiotics at this time as there is higher concern for sepsis at this time.  Blood cultures as well as a chest x-ray and urinalysis are ordered for further evaluation for possible sepsis.  I did speak with neurosurgery.  The patient's MRI does not show any fluid collections.  They felt the patient could stay here at [facility] for admission.  Calls placed to hospitalist service for admission.  Amount and/or Complexity of Data Reviewed Labs: ordered. Radiology: ordered.  Risk OTC drugs. Prescription drug management.           Final Clinical Impression(s) / ED Diagnoses Final diagnoses:  Sepsis, due to unspecified organism, unspecified whether acute organ dysfunction present [facility])  Generalized weakness    Rx / DC Orders ED Discharge Orders     None         Durwin Glaze, MD 09/24/23 0006

## 2023-09-23 NOTE — ED Triage Notes (Signed)
Patient BIB [facility] from home. Had back surgery on 9/16. Has been failure to thrive since, unable to get strength. No eating/drinking/taking his medication. Feels dizzy standing/walking.   EMS 86 systolic IV fluid 110/63

## 2023-09-23 NOTE — Progress Notes (Signed)
A consult was received from an ED physician for vanc and cefepime per pharmacy dosing.  The patient's profile has been reviewed for ht/wt/allergies/indication/available labs.   A one time order has been placed for vanc 1500mg  x 1 and cefepime 2gm x 1.    Further antibiotics/pharmacy consults should be ordered by admitting physician if indicated.                       Thank you, Arley Phenix RPh 09/23/2023, 10:03 PM

## 2023-09-23 NOTE — H&P (Incomplete)
History and Physical    Patient: [name] ZOX:[id number] DOB: 1934-09-24 DOA: 09/23/2023 DOS: the patient was seen and examined on 09/23/2023 PCP: Emilio Aspen, MD  Patient coming from: Home  Chief Complaint:  Chief Complaint  Patient presents with  . Failure To Thrive   HPI: [name] is a 89 y.o. male with medical history significant of hx of prostate cancer s/p radiation, CKD stage IV secondary to RCC s/p partial L nephrectomy, HTN, HLD, CAD s/p CABG (2007), T2DM, chronic systolic and diastolic heart failure, and TAVR 06/2018 who presents with generalized weakness.   He was noted to be hypotensive with SBP in the 80s with EMS. Improved with 500cc of IV fluid. In ED he was febrile to 100.45F, tachycardic HR 113 and normotensive on room air.   Has leukocytosis of 14K, hgb of 10, platelet of 475. Lactic within normal limits.   BMP is unremarkable with creatinine about baseline.   Review of Systems: {ROS_Text:26778} Past Medical History:  Diagnosis Date  . Anemia due to chronic kidney disease   . Benign localized prostatic hyperplasia with lower urinary tract symptoms (LUTS)   . Bladder cancer (HCC) 04/2023   dr Retta Diones  . Carpal tunnel syndrome on both sides   . Chronic constipation   . Chronic gout without tophus    followed by pcp    (05-07-2023  per pt last flare-up 6-8 months ago, usually affects bilateral great toes)  . Chronic low back pain    per pt s/p RFA's  . Chronic narcotic use   . Chronic systolic CHF (congestive heart failure) (HCC)    followed by cardiology;  preserved ef  . CKD (chronic kidney disease), stage III (HCC) 2017   previously seen by nephrologsit--- dr Hyman Hopes  . Coronary artery disease 1993   cardiologist--- dr h. Katrinka Blazing;   1993-- s/p cath w/ PTCA to occluded RCA;  a. 2007: s/p CABG x4 (LIMA--> LAD, rSVG--> RI, rSVG-seq -->dRCA and PLA))  c. 2017: LHC with 2/3 patent bypass grafts with occuluded SVG--> RI  . DDD (degenerative disc  disease), lumbosacral   . GERD (gastroesophageal reflux disease)   . History of adenomatous polyp of colon   . History of cardiomyopathy 09/2016   in setting severe aortic stenosis and new acute on chronic systolic CHF, ef 81-19%  . History of DVT of lower extremity 2008   superficial thrombophlebitis-- R Lower leg, right upper leg  . History of polymyalgia rheumatica 2011   per pt hx was treated and resolved after being dx approx 2011  . History of prostate cancer 2009   urologist--- dr Retta Diones;   dx 2009,  Gleason 4+3;   02-08-2009  s/p radioactive prostate seed implants by dr Vonita Moss  (05-07-2023  PSA undetectable)  . History of renal cell cancer 2014   primary urologist-- dr Frederich Cha;   incidental finding on imaging for back , left renal mass;   08-31-2013  s/p partial left nephrecotmy  . History of septic shock 2016   post op  lumbar back surgery, positive blood culture  (discitis/ ostomyelitis)  . Hyperlipidemia   . Hypertension   . Peripheral neuropathy    hands and feet  . Pulmonary nodule, right    solitary ,  last Chest CT in epic 12-08-2018 stable  . S/P aortic valve replacement with prosthetic valve 07/02/2017   @[facility]  by Dr Laneta Simmers w/ [employer] prosthesis  for severe aortic valve stenosis  (nonrheumatic)  . Secondary hyperparathyroidism  of renal origin (HCC)   . Type 2 diabetes mellitus (HCC)    followed by pcp;   (05-07-2023  per pt only check blood sugar occasionlly)  . Wears partial dentures    upper   Past Surgical History:  Procedure Laterality Date  . APPENDECTOMY     child  . CARDIAC CATHETERIZATION N/A 11/03/2016   Procedure: Right/Left Heart Cath and Coronary/Graft Angiography;  Surgeon: Lyn Records, MD;  [facility] INVASIVE CV LAB;  Service: Cardiovascular;  Laterality: N/A;  . CARDIAC CATHETERIZATION  03/30/2006   @[facility]  by dr h. Katrinka Blazing;   severe coronary artery disease  . CATARACT EXTRACTION W/ INTRAOCULAR LENS IMPLANT Bilateral    2015/ 2019  .  CORONARY ANGIOPLASTY  1993   @[facility]  by dr h. Katrinka Blazing;   PTCA to total occluded RCA  . CORONARY ARTERY BYPASS GRAFT  03/31/2006   @[facility]  by dr gerhardt;   x4--  LIMA--LAD/  rSVG--RI/   rSVG seq -- dRCA and PLB of RCA  . INSERTION PROSTATE RADIATION SEED  02/08/2009   @[facility]  by dr Vonita Moss;   radioactive prostate seed implants for prostate cancer  . LEFT HEART CATH AND CORS/GRAFTS ANGIOGRAPHY N/A 09/01/2023   Procedure: LEFT HEART CATH AND CORS/GRAFTS ANGIOGRAPHY;  Surgeon: Corky Crafts, MD;  Location: [facility] INVASIVE CV LAB;  Service: Cardiovascular;  Laterality: N/A;  . LUMBAR SPINE SURGERY  10/02/2015   dr Dutch Quint;   L3--L5  . ROBOTIC ASSITED PARTIAL NEPHRECTOMY Left 08/31/2013   Procedure: ROBOTIC ASSITED PARTIAL NEPHRECTOMY;  Surgeon: Crecencio Mc, MD;  Location: [facility] ORS;  Service: Urology;  Laterality: Left;  . TEE WITHOUT CARDIOVERSION N/A 06/22/2017   Procedure: TRANSESOPHAGEAL ECHOCARDIOGRAM (TEE);  Surgeon: Tonny Bollman, MD;  Location: [facility] OR;  Service: Open Heart Surgery;  Laterality: N/A;  . TRANSCATHETER AORTIC VALVE REPLACEMENT, TRANSFEMORAL N/A 06/22/2017   Procedure: TRANSCATHETER AORTIC VALVE REPLACEMENT, TRANSFEMORAL;  Surgeon: Tonny Bollman, MD;  Location: [facility] OR;  Service: Open Heart Surgery;  Laterality: N/A;  . VEIN LIGATION AND STRIPPING Right 1972   right lower leg   Social History:  reports that he quit smoking about 42 years ago. His smoking use included cigarettes. He started smoking about 72 years ago. He has a 90 pack-year smoking history. He has never used smokeless tobacco. He reports that he does not drink alcohol and does not use drugs.  Allergies  Allergen Reactions  . Fish Allergy Swelling    Dark fish- salmon, tuna   . Lipitor [Atorvastatin] Other (See Comments)    Muscle pain  . Penicillins Swelling    SWELLING OF THE FEET  Has patient had a PCN reaction causing immediate rash, facial/tongue/throat swelling, SOB or lightheadedness with hypotension: no Has  patient had a PCN reaction causing severe rash involving mucus membranes or skin necrosis: no Has patient had a PCN reaction that required hospitalization no Has patient had a PCN reaction occurring within the last 10 years: no If all of the above answers are "NO", then may proceed with Cephalosporin use.  . Simvastatin Other (See Comments)    MUSCLE PAIN  . Adacel [Tetanus-Diphth-Acell Pertussis]     UNSPECIFIED REACTION   . Lasix [Furosemide] Swelling    SWELLING REACTION UNSPECIFIED   . Septra [Sulfamethoxazole-Trimethoprim]     UNSPECIFIED REACTION   . Flexeril [Cyclobenzaprine] Other (See Comments)    Causes confusion  . Pantoprazole Diarrhea    Family History  Problem Relation Age of Onset  . Heart Problems Mother   .  Heart Problems Father   . Neuropathy Neg Hx     Prior to Admission medications   Medication Sig Start Date End Date Taking? Authorizing Provider  aspirin 81 MG chewable tablet Chew 81 mg by mouth every evening.    [provider]  Ca Carbonate-Mag Hydroxide (ROLAIDS PO) Take by mouth as needed (reflux).    [provider]  cefdinir (OMNICEF) 300 MG capsule 2 (two) times daily.    [provider]  Cholecalciferol (VITAMIN D-3) 125 MCG (5000 UT) TABS Take 5,000 Units by mouth daily.    [provider]  glipiZIDE (GLUCOTROL) 10 MG tablet Take 5 mg by mouth 2 (two) times daily before a meal.    [provider]  HYDROcodone-acetaminophen (NORCO) 10-325 MG tablet Take 1 tablet by mouth every 4 (four) hours. Per pt on regular basis for chronic pain for severe back pain    [provider]  lidocaine (LIDODERM) 5 % Place 1 patch onto the skin as needed. Remove & Discard patch within 12 hours or as directed by MD    [provider]  metoprolol succinate (TOPROL XL) 25 MG 24 hr tablet Take 1 tablet (25 mg total) by mouth daily. 08/26/23   Christell Constant, MD  Multiple Vitamin (MULTIVITAMIN) tablet Take 1  tablet by mouth at bedtime.    [provider]  nitroGLYCERIN (NITROSTAT) 0.4 MG SL tablet Place 1 tablet (0.4 mg total) under the tongue every 5 (five) minutes as needed for chest pain. 08/26/23   Christell Constant, MD  OVER THE COUNTER MEDICATION Apply topically 4 (four) times daily as needed. Neuro Soothe Cream for nerve pain , hands/ feet    [provider]  OVER THE COUNTER MEDICATION Apply 1 application  topically daily as needed (Back pain). CBD Roll on    [provider]  OVER THE COUNTER MEDICATION Take 8 oz by mouth 2 (two) times daily. Blueprint to Nutrition Mix   Vaspro Plus, Greenberry and Inflamm with water and crystal light packets    [provider]  OVER THE COUNTER MEDICATION Take 1 tablet by mouth at bedtime as needed (Sleep). Relaxium sleep    [provider]  predniSONE (DELTASONE) 10 MG tablet Take 10 mg by mouth daily.    [provider]  Propylene Glycol (SYSTANE COMPLETE) 0.6 % SOLN Place 1 drop into the left eye 2 (two) times daily.    [provider]  psyllium (HYDROCIL/METAMUCIL) 95 % PACK Take 1 packet by mouth daily as needed for mild constipation.    [provider]  sulfamethoxazole-trimethoprim (BACTRIM DS) 800-160 MG tablet Take 1 tablet by mouth 2 (two) times daily. 08/21/23   [provider]  tamsulosin (FLOMAX) 0.4 MG CAPS capsule Take 0.4 mg by mouth 2 (two) times daily.    [provider]  torsemide (DEMADEX) 20 MG tablet TAKE 1 TABLET BY MOUTH EVERY MONDAY, WEDNESDAY, AND FRIDAY Patient taking differently: Take 20 mg by mouth daily as needed. 11/30/22   Lyn Records, MD    Physical Exam: Vitals:   09/23/23 2151 09/23/23 2200 09/23/23 2230 09/23/23 2235  BP:  (!) 104/58  112/62  Pulse: (!) 110  (!) 113 (!) 106  Resp:  (!) 24 (!) 24 20  Temp: (!) 100.6 F (38.1 C)     TempSrc:      SpO2: 91% 96% 93% 96%  Weight:      Height:       ***  Data Reviewed: {Tip  this will not be part of the note when signed- Document your independent interpretation of telemetry tracing, EKG, lab, Radiology test or any other diagnostic tests. Add any new diagnostic test ordered today. (Optional):26781} {Results:26384}  Assessment and Plan: No notes have been filed under this hospital service. Service: Hospitalist     Advance Care Planning:   Code Status: Prior ***  Consults: ***  Family Communication: ***  Severity of Illness: {Observation/Inpatient:21159}  Author: Anselm Jungling, DO 09/23/2023 11:58 PM  For on call review [email or web address].

## 2023-09-24 DIAGNOSIS — D631 Anemia in chronic kidney disease: Secondary | ICD-10-CM | POA: Diagnosis present

## 2023-09-24 DIAGNOSIS — I429 Cardiomyopathy, unspecified: Secondary | ICD-10-CM | POA: Diagnosis present

## 2023-09-24 DIAGNOSIS — N184 Chronic kidney disease, stage 4 (severe): Secondary | ICD-10-CM | POA: Diagnosis present

## 2023-09-24 DIAGNOSIS — G8929 Other chronic pain: Secondary | ICD-10-CM | POA: Diagnosis present

## 2023-09-24 DIAGNOSIS — T8141XA Infection following a procedure, superficial incisional surgical site, initial encounter: Secondary | ICD-10-CM | POA: Diagnosis present

## 2023-09-24 DIAGNOSIS — M353 Polymyalgia rheumatica: Secondary | ICD-10-CM | POA: Diagnosis present

## 2023-09-24 DIAGNOSIS — I959 Hypotension, unspecified: Secondary | ICD-10-CM

## 2023-09-24 DIAGNOSIS — A419 Sepsis, unspecified organism: Secondary | ICD-10-CM | POA: Diagnosis present

## 2023-09-24 DIAGNOSIS — E1140 Type 2 diabetes mellitus with diabetic neuropathy, unspecified: Secondary | ICD-10-CM | POA: Diagnosis present

## 2023-09-24 DIAGNOSIS — G9341 Metabolic encephalopathy: Secondary | ICD-10-CM | POA: Diagnosis present

## 2023-09-24 DIAGNOSIS — E1169 Type 2 diabetes mellitus with other specified complication: Secondary | ICD-10-CM | POA: Diagnosis present

## 2023-09-24 DIAGNOSIS — E1122 Type 2 diabetes mellitus with diabetic chronic kidney disease: Secondary | ICD-10-CM | POA: Diagnosis present

## 2023-09-24 DIAGNOSIS — I130 Hypertensive heart and chronic kidney disease with heart failure and stage 1 through stage 4 chronic kidney disease, or unspecified chronic kidney disease: Secondary | ICD-10-CM | POA: Diagnosis present

## 2023-09-24 DIAGNOSIS — T8149XA Infection following a procedure, other surgical site, initial encounter: Secondary | ICD-10-CM

## 2023-09-24 DIAGNOSIS — K219 Gastro-esophageal reflux disease without esophagitis: Secondary | ICD-10-CM | POA: Diagnosis present

## 2023-09-24 DIAGNOSIS — N2581 Secondary hyperparathyroidism of renal origin: Secondary | ICD-10-CM | POA: Diagnosis present

## 2023-09-24 DIAGNOSIS — N400 Enlarged prostate without lower urinary tract symptoms: Secondary | ICD-10-CM | POA: Diagnosis present

## 2023-09-24 DIAGNOSIS — R627 Adult failure to thrive: Secondary | ICD-10-CM | POA: Diagnosis present

## 2023-09-24 DIAGNOSIS — K5900 Constipation, unspecified: Secondary | ICD-10-CM | POA: Diagnosis not present

## 2023-09-24 DIAGNOSIS — M1A9XX0 Chronic gout, unspecified, without tophus (tophi): Secondary | ICD-10-CM | POA: Diagnosis present

## 2023-09-24 DIAGNOSIS — N390 Urinary tract infection, site not specified: Secondary | ICD-10-CM | POA: Diagnosis present

## 2023-09-24 DIAGNOSIS — E860 Dehydration: Secondary | ICD-10-CM | POA: Diagnosis present

## 2023-09-24 DIAGNOSIS — Z952 Presence of prosthetic heart valve: Secondary | ICD-10-CM | POA: Diagnosis not present

## 2023-09-24 DIAGNOSIS — R531 Weakness: Secondary | ICD-10-CM | POA: Diagnosis present

## 2023-09-24 DIAGNOSIS — I5042 Chronic combined systolic (congestive) and diastolic (congestive) heart failure: Secondary | ICD-10-CM | POA: Diagnosis present

## 2023-09-24 DIAGNOSIS — E785 Hyperlipidemia, unspecified: Secondary | ICD-10-CM | POA: Diagnosis present

## 2023-09-24 DIAGNOSIS — I2510 Atherosclerotic heart disease of native coronary artery without angina pectoris: Secondary | ICD-10-CM | POA: Diagnosis present

## 2023-09-24 DIAGNOSIS — Y838 Other surgical procedures as the cause of abnormal reaction of the patient, or of later complication, without mention of misadventure at the time of the procedure: Secondary | ICD-10-CM | POA: Diagnosis present

## 2023-09-24 LAB — URINALYSIS, ROUTINE W REFLEX MICROSCOPIC
Bilirubin Urine: NEGATIVE
Glucose, UA: NEGATIVE mg/dL
Ketones, ur: 5 mg/dL — AB
Nitrite: POSITIVE — AB
Protein, ur: 30 mg/dL — AB
Specific Gravity, Urine: 1.017 (ref 1.005–1.030)
WBC, UA: >50 WBC/hpf (ref 0–5)
pH: 5 (ref 5.0–8.0)

## 2023-09-24 LAB — COMPREHENSIVE METABOLIC PANEL
ALT: 14 U/L (ref 0–44)
AST: 22 U/L (ref 15–41)
Albumin: 2.3 g/dL — ABNORMAL LOW (ref 3.5–5.0)
Alkaline Phosphatase: 62 U/L (ref 38–126)
Anion gap: 10 (ref 5–15)
BUN: 26 mg/dL — ABNORMAL HIGH (ref 8–23)
CO2: 24 mmol/L (ref 22–32)
Calcium: 9.1 mg/dL (ref 8.9–10.3)
Chloride: 103 mmol/L (ref 98–111)
Creatinine, Ser: 1.5 mg/dL — ABNORMAL HIGH (ref 0.61–1.24)
GFR, Estimated: 45 mL/min — ABNORMAL LOW (ref >60)
Glucose, Bld: 180 mg/dL — ABNORMAL HIGH (ref 70–99)
Potassium: 4.6 mmol/L (ref 3.5–5.1)
Sodium: 137 mmol/L (ref 135–145)
Total Bilirubin: 1 mg/dL (ref 0.3–1.2)
Total Protein: 6.5 g/dL (ref 6.5–8.1)

## 2023-09-24 LAB — CBC WITH DIFFERENTIAL/PLATELET
Abs Immature Granulocytes: 0.05 10*3/uL (ref 0.00–0.07)
Basophils Absolute: 0.1 10*3/uL (ref 0.0–0.1)
Basophils Relative: 1 %
Eosinophils Absolute: 0.3 10*3/uL (ref 0.0–0.5)
Eosinophils Relative: 3 %
HCT: 30.1 % — ABNORMAL LOW (ref 39.0–52.0)
Hemoglobin: 9.5 g/dL — ABNORMAL LOW (ref 13.0–17.0)
Immature Granulocytes: 0 %
Lymphocytes Relative: 13 %
Lymphs Abs: 1.5 10*3/uL (ref 0.7–4.0)
MCH: 32 pg (ref 26.0–34.0)
MCHC: 31.6 g/dL (ref 30.0–36.0)
MCV: 101.3 fL — ABNORMAL HIGH (ref 80.0–100.0)
Monocytes Absolute: 1 10*3/uL (ref 0.1–1.0)
Monocytes Relative: 8 %
Neutro Abs: 9 10*3/uL — ABNORMAL HIGH (ref 1.7–7.7)
Neutrophils Relative %: 75 %
Platelets: 380 10*3/uL (ref 150–400)
RBC: 2.97 MIL/uL — ABNORMAL LOW (ref 4.22–5.81)
RDW: 14 % (ref 11.5–15.5)
WBC: 12 10*3/uL — ABNORMAL HIGH (ref 4.0–10.5)
nRBC: 0 % (ref 0.0–0.2)

## 2023-09-24 LAB — I-STAT CG4 LACTIC ACID, ED: Lactic Acid, Venous: 2.4 mmol/L (ref 0.5–1.9)

## 2023-09-24 LAB — VITAMIN B12: Vitamin B-12: 726 pg/mL (ref 180–914)

## 2023-09-24 LAB — SEDIMENTATION RATE: Sed Rate: 26 mm/h — ABNORMAL HIGH (ref 0–16)

## 2023-09-24 LAB — C-REACTIVE PROTEIN: CRP: 20.8 mg/dL — ABNORMAL HIGH (ref <1.0)

## 2023-09-24 LAB — LACTIC ACID, PLASMA
Lactic Acid, Venous: 1 mmol/L (ref 0.5–1.9)
Lactic Acid, Venous: 1.2 mmol/L (ref 0.5–1.9)

## 2023-09-24 LAB — MAGNESIUM: Magnesium: 1.9 mg/dL (ref 1.7–2.4)

## 2023-09-24 MED ORDER — MORPHINE SULFATE (PF) 2 MG/ML IV SOLN
2.0000 mg | INTRAVENOUS | Status: DC | PRN
  Administered 2023-09-25 – 2023-09-26 (×2): 2 mg via INTRAVENOUS
  Filled 2023-09-24 (×2): qty 1

## 2023-09-24 MED ORDER — ONDANSETRON HCL 4 MG/2ML IJ SOLN
4.0000 mg | Freq: Four times a day (QID) | INTRAMUSCULAR | Status: DC | PRN
  Administered 2023-09-30: 4 mg via INTRAVENOUS
  Filled 2023-09-24: qty 2

## 2023-09-24 MED ORDER — MORPHINE SULFATE (PF) 2 MG/ML IV SOLN: 2.0000 mg | INTRAVENOUS | Status: DC | PRN

## 2023-09-24 MED ORDER — ASPIRIN 81 MG PO CHEW
81.0000 mg | CHEWABLE_TABLET | Freq: Every evening | ORAL | Status: DC
  Administered 2023-09-24 – 2023-09-25 (×2): 81 mg via ORAL
  Filled 2023-09-24 (×2): qty 1

## 2023-09-24 MED ORDER — TAMSULOSIN HCL 0.4 MG PO CAPS
0.4000 mg | ORAL_CAPSULE | Freq: Two times a day (BID) | ORAL | Status: DC
  Administered 2023-09-24 – 2023-09-30 (×14): 0.4 mg via ORAL
  Filled 2023-09-24 (×14): qty 1

## 2023-09-24 MED ORDER — SODIUM CHLORIDE 0.9 % IV SOLN
1.0000 g | INTRAVENOUS | Status: DC
  Administered 2023-09-24: 1 g via INTRAVENOUS
  Filled 2023-09-24: qty 10

## 2023-09-24 MED ORDER — SODIUM CHLORIDE 0.9 % IV SOLN
2.0000 g | Freq: Two times a day (BID) | INTRAVENOUS | Status: DC
  Administered 2023-09-24 – 2023-09-26 (×4): 2 g via INTRAVENOUS
  Filled 2023-09-24 (×4): qty 12.5

## 2023-09-24 MED ORDER — HYDROCODONE-ACETAMINOPHEN 5-325 MG PO TABS
1.0000 | ORAL_TABLET | Freq: Four times a day (QID) | ORAL | Status: DC | PRN
  Administered 2023-09-24: 1 via ORAL
  Filled 2023-09-24: qty 1

## 2023-09-24 MED ORDER — HYDROCODONE-ACETAMINOPHEN 5-325 MG PO TABS
1.0000 | ORAL_TABLET | ORAL | Status: DC | PRN
  Administered 2023-09-24 – 2023-09-26 (×8): 1 via ORAL
  Filled 2023-09-24 (×9): qty 1

## 2023-09-24 MED ORDER — VANCOMYCIN HCL 1500 MG/300ML IV SOLN
1500.0000 mg | INTRAVENOUS | Status: DC
  Administered 2023-09-25: 1500 mg via INTRAVENOUS
  Filled 2023-09-24: qty 300

## 2023-09-24 NOTE — Assessment & Plan Note (Signed)
S/p TAVR -Continue aspirn

## 2023-09-24 NOTE — Sepsis Progress Note (Signed)
Notified bedside nurse of need to order repeat lactic acid.  

## 2023-09-24 NOTE — Progress Notes (Signed)
Patient is refusing a.m. labdraws despite education on the importance of lab values.

## 2023-09-24 NOTE — Assessment & Plan Note (Signed)
Euvolemic on exam -pt not able to recall his medication and will provide list tomorrow

## 2023-09-24 NOTE — Assessment & Plan Note (Signed)
-  SBP noted to be 80 on arrival. Hold home antihypertensives and keep on continuous IV fluid

## 2023-09-24 NOTE — Consult Note (Addendum)
WOC Nurse Consult Note: Reason for Consult: Consult requested to provide topical treatment recommendations for middle back wound.  Pt had surgery to this location on 9/16 at [facility].  Wound type: Full thickness surgical site is 100% yellow slough, 8X1cm, slightly fluctuant when probed with a swab, mod amt tan drainage, painful to touch.  Dressing procedure/placement/frequency: Topical treatment orders provided for bedside nurses to perform as follows to absorb drainage and provide antimicrobial benefits: Apply strip of Aquacel [place] # [id number]) to back wound Q day and cover with foam dressing. Change foam dresing Q 3 days or PRN soiling.  Progress notes indicate Neurosurgery consult is pending.  Please refer to their team for further plan of care.  Please re-consult if further assistance is needed.  Thank-you,  Cammie Mcgee MSN, RN, CWOCN, [place], CNS [phone]

## 2023-09-24 NOTE — Hospital Course (Signed)
[name] is a 89 y.o. male with a history of prostate cancer status post radiation, CKD stage IV, renal cell carcinoma status post nephrectomy, hypertension, hyperlipidemia, CAD status post CABG, diabetes mellitus type 2, chronic combined heart failure, TAVR, chronic back pain.  Patient presented secondary to generalized weakness, back pain and decreased oral intake and was found to have evidence of surgical wound infection with cellulitis in addition to sepsis secondary to UTI.  Patient started empirically on antibiotics with improvement of symptoms.

## 2023-09-24 NOTE — Evaluation (Signed)
Speech Language Pathology Evaluation Patient Details Name: [name] MRN: [id number] DOB: [birth year] Today's Date: 09/24/2023 Time: 4098-1191 SLP Time Calculation (min) (ACUTE ONLY): 21 min  Problem List:  Patient Active Problem List   Diagnosis Date Noted   Hypotension 09/24/2023   Wound cellulitis after surgery 09/24/2023   S/P CABG (coronary artery bypass graft) 07/12/2023   Myalgia due to statin 07/12/2023   History of renal cell cancer    Diabetes mellitus (HCC)    Prostate cancer [facility])    Coronary artery disease    Chronic combined systolic and diastolic heart failure (HCC)    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   Discitis 12/22/2015   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   Sepsis secondary to UTI (HCC) 10/26/2015   CKD (chronic kidney disease), stage III (HCC) 10/26/2015   Essential hypertension 02/11/2015   Hyperlipidemia associated with type 2 diabetes mellitus (HCC)    GERD (gastroesophageal reflux disease)    H/O blood clots    Low back pain    Polymyalgia (HCC)    Gout    Glaucoma    Past Medical History:  Past Medical History:  Diagnosis Date   Anemia due to chronic kidney disease    Benign localized prostatic hyperplasia with lower urinary tract symptoms (LUTS)    Bladder cancer (HCC) 04/2023   dr dahlstedt   Carpal tunnel syndrome on both sides    Chronic constipation    Chronic gout without tophus    followed by pcp    (05-07-2023  per pt last flare-up 6-8 months ago, usually affects bilateral great toes)   Chronic low back pain    per pt s/p RFA's   Chronic narcotic use    Chronic systolic CHF (congestive heart failure) (HCC)    followed by cardiology;  preserved ef   CKD (chronic kidney disease), stage III (HCC) 2017   previously seen by nephrologsit--- dr Hyman Hopes   Coronary artery disease 1993   cardiologist--- dr h. Katrinka Blazing;   1993-- s/p cath w/ PTCA to occluded RCA;  a. 2007: s/p CABG x4 (LIMA--> LAD, rSVG--> RI, rSVG-seq  -->dRCA and PLA))  c. 2017: LHC with 2/3 patent bypass grafts with occuluded SVG--> RI   DDD (degenerative disc disease), lumbosacral    GERD (gastroesophageal reflux disease)    History of adenomatous polyp of colon    History of cardiomyopathy 09/2016   in setting severe aortic stenosis and new acute on chronic systolic CHF, ef 47-82%   History of DVT of lower extremity 2008   superficial thrombophlebitis-- R Lower leg, right upper leg   History of polymyalgia rheumatica 2011   per pt hx was treated and resolved after being dx approx 2011   History of prostate cancer 2009   urologist--- dr Retta Diones;   dx 2009,  Gleason 4+3;   02-08-2009  s/p radioactive prostate seed implants by dr Vonita Moss  (05-07-2023  PSA undetectable)   History of renal cell cancer 2014   primary urologist-- dr Frederich Cha;   incidental finding on imaging for back , left renal mass;   08-31-2013  s/p partial left nephrecotmy   History of septic shock 2016   post op  lumbar back surgery, positive blood culture  (discitis/ ostomyelitis)   Hyperlipidemia    Hypertension    Peripheral neuropathy    hands and feet   Pulmonary nodule, right    solitary ,  last Chest CT in epic 12-08-2018 stable   S/P aortic valve replacement with  prosthetic valve 07/02/2017   @[facility]  by Dr Laneta Simmers w/ Stephani [employer] prosthesis  for severe aortic valve stenosis  (nonrheumatic)   Secondary hyperparathyroidism of renal origin [facility])    Type 2 diabetes mellitus (HCC)    followed by pcp;   (05-07-2023  per pt only check blood sugar occasionlly)   Wears partial dentures    upper   Past Surgical History:  Past Surgical History:  Procedure Laterality Date   APPENDECTOMY     child   CARDIAC CATHETERIZATION N/A 11/03/2016   Procedure: Right/Left Heart Cath and Coronary/Graft Angiography;  Surgeon: Lyn Records, MD;  Location[facility] INVASIVE CV LAB;  Service: Cardiovascular;  Laterality: N/A;   CARDIAC CATHETERIZATION  03/30/2006   @[facility]  by dr h.  Katrinka Blazing;   severe coronary artery disease   CATARACT EXTRACTION W/ INTRAOCULAR LENS IMPLANT Bilateral    2015/ 2019   CORONARY ANGIOPLASTY  1993   @[facility]  by dr h. Katrinka Blazing;   PTCA to total occluded RCA   CORONARY ARTERY BYPASS GRAFT  03/31/2006   @[facility]  by dr gerhardt;   x4--  LIMA--LAD/  rSVG--RI/   rSVG seq -- dRCA and PLB of RCA   INSERTION PROSTATE RADIATION SEED  02/08/2009   @[facility]  by dr Vonita Moss;   radioactive prostate seed implants for prostate cancer   LEFT HEART CATH AND CORS/GRAFTS ANGIOGRAPHY N/A 09/01/2023   Procedure: LEFT HEART CATH AND CORS/GRAFTS ANGIOGRAPHY;  Surgeon: Corky Crafts, MD;  Location: [facility] INVASIVE CV LAB;  Service: Cardiovascular;  Laterality: N/A;   LUMBAR SPINE SURGERY  10/02/2015   dr Dutch Quint;   L3--L5   ROBOTIC ASSITED PARTIAL NEPHRECTOMY Left 08/31/2013   Procedure: ROBOTIC ASSITED PARTIAL NEPHRECTOMY;  Surgeon: Crecencio Mc, MD;  Location: [facility] ORS;  Service: Urology;  Laterality: Left;   TEE WITHOUT CARDIOVERSION N/A 06/22/2017   Procedure: TRANSESOPHAGEAL ECHOCARDIOGRAM (TEE);  Surgeon: Tonny Bollman, MD;  Location: [facility] OR;  Service: Open Heart Surgery;  Laterality: N/A;   TRANSCATHETER AORTIC VALVE REPLACEMENT, TRANSFEMORAL N/A 06/22/2017   Procedure: TRANSCATHETER AORTIC VALVE REPLACEMENT, TRANSFEMORAL;  Surgeon: Tonny Bollman, MD;  Location: [facility] OR;  Service: Open Heart Surgery;  Laterality: N/A;   VEIN LIGATION AND STRIPPING Right 1972   right lower leg   HPI:  Pt is an 89 yo male presenting 10/3 with FTT after recent lumbar fusion 9/16 at [facility]. Admitted with sepsis secondary to UTI. Also with wound cellulitis after recent lumbar fusion at [facility] 09/06/23. PMH includes: prostate ca s/p XRT, CKD, HTN, HLD, CAD s/p CABG, T2DM, CHF, TAVR, degenerative scoliosis   Assessment / Plan / Recommendation Clinical Impression  Pt reports that he is typically independent but presented today with cognitive difficulties that are therefore presumed to be acute changes.  Also question the impact of acute infection and pain on performance, so will need ongoing reassessment of abilities. He scored 13 out of 24 possible points on the SLUMS (attempted to do written portions but it was too painful for his back, so they were deferred). Although he did not complete the clock, note that he started writing the numbers in reverse order. Additionally, pt had trouble with storage > retrieval, selective attention, and comprehension of higher level information. He is in agreement with SLP f/u acutely - may also benefit at next level of care depending on progress.    SLP Assessment  SLP Recommendation/Assessment: Patient needs continued Speech Lanaguage Pathology Services SLP Visit Diagnosis: Cognitive communication deficit (R41.841)    Recommendations for follow up therapy are  one component of a multi-disciplinary discharge planning process, led by the attending physician.  Recommendations may be updated based on patient status, additional functional criteria and insurance authorization.    Follow Up Recommendations  Skilled nursing-short term rehab (<3 hours/day)    Assistance Recommended at Discharge  Frequent or constant Supervision/Assistance  Functional Status Assessment Patient has had a recent decline in their functional status and demonstrates the ability to make significant improvements in function in a reasonable and predictable amount of time.  Frequency and Duration min 2x/week  2 weeks      SLP Evaluation Cognition  Overall Cognitive Status: Impaired/Different from baseline Arousal/Alertness: Awake/alert Orientation Level: Oriented X4 Attention: Selective Selective Attention: Impaired Selective Attention Impairment: Verbal complex Memory: Impaired Memory Impairment: Storage deficit Problem Solving: Impaired Problem Solving Impairment: Verbal complex       Comprehension  Auditory Comprehension Overall Auditory Comprehension: Impaired Commands:  Impaired Complex Commands:  (difficulty comprehending instructions at times, paragraph level comprehension) Conversation: Simple Interfering Components: Pain;Attention    Expression Expression Primary Mode of Expression: Verbal Verbal Expression Overall Verbal Expression: Appears within functional limits for tasks assessed   Oral / Motor  Motor Speech Overall Motor Speech: Appears within functional limits for tasks assessed            Mahala Menghini., M.A. CCC-SLP Acute Rehabilitation Services Office [phone]  Secure chat preferred  09/24/2023, 10:49 AM

## 2023-09-24 NOTE — ED Notes (Signed)
ED TO INPATIENT HANDOFF REPORT  Name/Age/Gender [name] 89 y.o. male  Code Status    Code Status Orders  (From admission, onward)           Start     Ordered   09/24/23 0033  Full code  Continuous       Question:  By:  Answer:  Consent: discussion documented in EHR   09/24/23 0033           Code Status History     Date Active Date Inactive Code Status Order ID Comments User Context   09/01/2023 1108 09/01/2023 1848 Full Code [id number]  Corky Crafts, MD Inpatient   06/22/2017 1043 06/24/2017 1659 Full Code [id number]  Tonny Bollman, MD Inpatient   11/03/2016 0852 11/03/2016 1547 Full Code [id number]  Lyn Records, MD Inpatient   12/22/2015 1446 12/24/2015 1509 Full Code [id number]  Meredith Pel, NP Inpatient   10/26/2015 0332 10/31/2015 1901 Full Code [id number]  Ron Parker, MD Inpatient      Advance Directive Documentation    Flowsheet Row Most Recent Value  Type of Advance Directive Healthcare Power of Attorney  Pre-existing out of facility DNR order (yellow form or pink MOST form) --  "MOST" Form in Place? --       Home/SNF/Other Home  Chief Complaint Sepsis secondary to UTI (HCC) [A41.9, N39.0]  Level of Care/Admitting Diagnosis ED Disposition     ED Disposition  Admit   Condition  --   Comment  Hospital [facility] [[id number]]  Level of Care: Telemetry [5]  Admit to tele based on following criteria: Other see comments  Comments: rate  May admit patient to [facility] or [facility] if equivalent level of care is available:: No  Covid Evaluation: Asymptomatic - no recent exposure (last 10 days) testing not required  Diagnosis: Sepsis secondary to UTI [facility]) [[id number]]  Admitting Physician: Anselm Jungling [[id number]]  Attending Physician: Anselm Jungling [[id number]]  Certification:: I certify this patient will need inpatient services for at least 2 midnights  Expected Medical Readiness: 09/27/2023          Medical  History Past Medical History:  Diagnosis Date   Anemia due to chronic kidney disease    Benign localized prostatic hyperplasia with lower urinary tract symptoms (LUTS)    Bladder cancer (HCC) 04/2023   dr dahlstedt   Carpal tunnel syndrome on both sides    Chronic constipation    Chronic gout without tophus    followed by pcp    (05-07-2023  per pt last flare-up 6-8 months ago, usually affects bilateral great toes)   Chronic low back pain    per pt s/p RFA's   Chronic narcotic use    Chronic systolic CHF (congestive heart failure) (HCC)    followed by cardiology;  preserved ef   CKD (chronic kidney disease), stage III (HCC) 2017   previously seen by nephrologsit--- dr Hyman Hopes   Coronary artery disease 1993   cardiologist--- dr h. Katrinka Blazing;   1993-- s/p cath w/ PTCA to occluded RCA;  a. 2007: s/p CABG x4 (LIMA--> LAD, rSVG--> RI, rSVG-seq -->dRCA and PLA))  c. 2017: LHC with 2/3 patent bypass grafts with occuluded SVG--> RI   DDD (degenerative disc disease), lumbosacral    GERD (gastroesophageal reflux disease)    History of adenomatous polyp of colon    History of cardiomyopathy 09/2016   in setting severe aortic stenosis and new acute on  chronic systolic CHF, ef 11-91%   History of DVT of lower extremity 2008   superficial thrombophlebitis-- R Lower leg, right upper leg   History of polymyalgia rheumatica 2011   per pt hx was treated and resolved after being dx approx 2011   History of prostate cancer 2009   urologist--- dr Retta Diones;   dx 2009,  Gleason 4+3;   02-08-2009  s/p radioactive prostate seed implants by dr Vonita Moss  (05-07-2023  PSA undetectable)   History of renal cell cancer 2014   primary urologist-- dr Frederich Cha;   incidental finding on imaging for back , left renal mass;   08-31-2013  s/p partial left nephrecotmy   History of septic shock 2016   post op  lumbar back surgery, positive blood culture  (discitis/ ostomyelitis)   Hyperlipidemia    Hypertension    Peripheral  neuropathy    hands and feet   Pulmonary nodule, right    solitary ,  last Chest CT in epic 12-08-2018 stable   S/P aortic valve replacement with prosthetic valve 07/02/2017   @[facility]  by Dr Laneta Simmers w/ [employer] prosthesis  for severe aortic valve stenosis  (nonrheumatic)   Secondary hyperparathyroidism of renal origin (HCC)    Type 2 diabetes mellitus (HCC)    followed by pcp;   (05-07-2023  per pt only check blood sugar occasionlly)   Wears partial dentures    upper    Allergies Allergies  Allergen Reactions   Fish Allergy Swelling    Dark fish- salmon, tuna    Lipitor [Atorvastatin] Other (See Comments)    Muscle pain   Penicillins Swelling    SWELLING OF THE FEET  Has patient had a PCN reaction causing immediate rash, facial/tongue/throat swelling, SOB or lightheadedness with hypotension: no Has patient had a PCN reaction causing severe rash involving mucus membranes or skin necrosis: no Has patient had a PCN reaction that required hospitalization no Has patient had a PCN reaction occurring within the last 10 years: no If all of the above answers are "NO", then may proceed with Cephalosporin use.   Simvastatin Other (See Comments)    MUSCLE PAIN   Adacel [Tetanus-Diphth-Acell Pertussis]     UNSPECIFIED REACTION    Lasix [Furosemide] Swelling    SWELLING REACTION UNSPECIFIED    Septra [Sulfamethoxazole-Trimethoprim]     UNSPECIFIED REACTION    Flexeril [Cyclobenzaprine] Other (See Comments)    Causes confusion   Pantoprazole Diarrhea    IV Location/Drains/Wounds Patient Lines/Drains/Airways Status     Active Line/Drains/Airways     Name Placement date Placement time Site Days   Peripheral IV 09/23/23 18 G 1.16" Left Antecubital 09/23/23  1515  Antecubital  1   Incision - 5 Ports Abdomen 1: Umbilicus;Superior 2: Left;Medial;Upper 3: Left;Medial 4: Left;Mid;Lateral 5: Left;Lateral;Lower 08/31/13  0810  -- 3676            Labs/Imaging Results for orders placed  or performed during the hospital encounter of 09/23/23 (from the past 48 hour(s))  Basic metabolic panel     Status: Abnormal   Collection Time: 09/23/23  4:21 PM  Result Value Ref Range   Sodium 136 135 - 145 mmol/L   Potassium 4.6 3.5 - 5.1 mmol/L   Chloride 100 98 - 111 mmol/L   CO2 26 22 - 32 mmol/L   Glucose, Bld 198 (H) 70 - 99 mg/dL    Comment: Glucose reference range applies only to samples taken after fasting for at least 8 hours.  BUN 33 (H) 8 - 23 mg/dL   Creatinine, Ser 0.86 (H) 0.61 - 1.24 mg/dL   Calcium 9.4 8.9 - 57.8 mg/dL   GFR, Estimated 37 (L) >60 mL/min    Comment: (NOTE) Calculated using the CKD-EPI Creatinine Equation (2021)    Anion gap 10 5 - 15    Comment: Performed at [facility], [address] [place], [place] [address]  CBC with Differential     Status: Abnormal   Collection Time: 09/23/23  4:21 PM  Result Value Ref Range   WBC 14.0 (H) 4.0 - 10.5 K/uL   RBC 3.19 (L) 4.22 - 5.81 MIL/uL   Hemoglobin 10.0 (L) 13.0 - 17.0 g/dL   HCT 95.2 (L) 84.1 - 32.4 %   MCV 101.6 (H) 80.0 - 100.0 fL   MCH 31.3 26.0 - 34.0 pg   MCHC 30.9 30.0 - 36.0 g/dL   RDW 40.1 02.7 - 25.3 %   Platelets 475 (H) 150 - 400 K/uL   nRBC 0.0 0.0 - 0.2 %   Neutrophils Relative % 75 %   Neutro Abs 10.6 (H) 1.7 - 7.7 K/uL   Lymphocytes Relative 14 %   Lymphs Abs 2.0 0.7 - 4.0 K/uL   Monocytes Relative 8 %   Monocytes Absolute 1.1 (H) 0.1 - 1.0 K/uL   Eosinophils Relative 1 %   Eosinophils Absolute 0.2 0.0 - 0.5 K/uL   Basophils Relative 1 %   Basophils Absolute 0.1 0.0 - 0.1 K/uL   Immature Granulocytes 1 %   Abs Immature Granulocytes 0.07 0.00 - 0.07 K/uL    Comment: Performed at [facility], [address], [place], [place] [address]  I-Stat CG4 Lactic Acid     Status: None   Collection Time: 09/23/23  4:30 PM  Result Value Ref Range   Lactic Acid, Venous 1.4 0.5 - 1.9 mmol/L  I-Stat CG4 Lactic Acid     Status: None   Collection  Time: 09/23/23 10:37 PM  Result Value Ref Range   Lactic Acid, Venous 1.2 0.5 - 1.9 mmol/L  Urinalysis, Routine w reflex microscopic -Urine, Clean Catch     Status: Abnormal   Collection Time: 09/23/23 11:51 PM  Result Value Ref Range   Color, Urine YELLOW YELLOW   APPearance CLEAR CLEAR   Specific Gravity, Urine 1.017 1.005 - 1.030   pH 5.0 5.0 - 8.0   Glucose, UA NEGATIVE NEGATIVE mg/dL   Hgb urine dipstick MODERATE (A) NEGATIVE   Bilirubin Urine NEGATIVE NEGATIVE   Ketones, ur 5 (A) NEGATIVE mg/dL   Protein, ur 30 (A) NEGATIVE mg/dL   Nitrite POSITIVE (A) NEGATIVE   Leukocytes,Ua MODERATE (A) NEGATIVE   RBC / HPF 6-10 0 - 5 RBC/hpf   WBC, UA >50 0 - 5 WBC/hpf   Bacteria, UA FEW (A) NONE SEEN   Squamous Epithelial / HPF 0-5 0 - 5 /HPF   Mucus PRESENT     Comment: Performed at [facility], [address], [place], [place] [address]  I-Stat CG4 Lactic Acid     Status: Abnormal   Collection Time: 09/24/23 12:24 AM  Result Value Ref Range   Lactic Acid, Venous 2.4 (HH) 0.5 - 1.9 mmol/L   Comment NOTIFIED PHYSICIAN    MR LUMBAR SPINE WO CONTRAST  Result Date: 09/23/2023 CLINICAL DATA:  Low back pain EXAM: MRI LUMBAR SPINE WITHOUT CONTRAST TECHNIQUE: Multiplanar, multisequence MR imaging of the lumbar spine was performed. No intravenous contrast was  administered. COMPARISON:  12/18/2016 FINDINGS: Segmentation:  Standard Alignment:  Grade 1 anterolisthesis at L3-4 Vertebrae: L1-4 posterior instrumented fusion. No acute abnormality. Conus medullaris and cauda equina: Conus extends to the L2 level. Conus and cauda equina appear normal. Paraspinal and other soft tissues: Negative Disc levels: T12-L1: Unremarkable. L1-L2: Postfusion changes. Unchanged small disc bulge with endplate spurring. No spinal canal stenosis. No neural foraminal stenosis. L2-L3: Postfusion changes. Unchanged right asymmetric disc bulge. Narrowing of both lateral recesses without central spinal canal  stenosis. Mild right neural foraminal stenosis. L3-L4: Postfusion changes. Small disc bulge with endplate spurring, unchanged. No spinal canal stenosis. Unchanged moderate right and mild left neural foraminal stenosis. L4-L5: Disc space narrowing with endplate spurring. No spinal canal stenosis. Unchanged severe left neural foraminal stenosis. L5-S1: Moderate facet hypertrophy and small disc bulge. There is a new right foraminal protrusion with annular fissure. No spinal canal stenosis. Mild right neural foraminal stenosis. Visualized sacrum: Normal. IMPRESSION: 1. New right foraminal protrusion with annular fissure at L5-S1 with mild right neural foraminal stenosis. 2. Unchanged severe left L4-5 neural foraminal stenosis. 3. Unchanged moderate right L3-4 neural foraminal stenosis. 4. L1-4 posterior instrumented fusion without residual spinal canal stenosis. Electronically Signed   By: Deatra Robinson M.D.   On: 09/23/2023 23:31   DG Chest Portable 1 View  Result Date: 09/23/2023 CLINICAL DATA:  Back surgery on 09/16. Failure to thrive since. Feels dizzy. EXAM: PORTABLE CHEST 1 VIEW COMPARISON:  Chest radiographs 05/27/2023 FINDINGS: Sternotomy. CABG. TAVR. Aortic atherosclerotic calcification. Stable cardiomediastinal silhouette. Low lung volumes accentuate pulmonary vascularity. No focal consolidation, pleural effusion, or pneumothorax. No displaced rib fractures. IMPRESSION: No active disease. Electronically Signed   By: Minerva Fester M.D.   On: 09/23/2023 22:15    Pending Labs Unresulted Labs (From admission, onward)     Start     Ordered   09/24/23 0500  CBC  Tomorrow morning,   R        09/24/23 0033   09/24/23 0500  Basic metabolic panel  Tomorrow morning,   R        09/24/23 0033   09/23/23 2157  Blood culture (routine x 2)  BLOOD CULTURE X 2,   R (with STAT occurrences)      09/23/23 2156            Vitals/Pain Today's Vitals   09/23/23 2230 09/23/23 2230 09/23/23 2235 09/24/23 0016   BP:   112/62 104/60  Pulse: (!) 113  (!) 106 (!) 107  Resp: (!) 24  20 (!) 21  Temp:    98.9 F (37.2 C)  TempSrc:    Oral  SpO2: 93%  96% 95%  Weight:      Height:      PainSc:  3       Isolation Precautions No active isolations  Medications Medications  lactated ringers infusion ( Intravenous New Bag/Given 09/23/23 2247)  vancomycin (VANCOREADY) IVPB 1500 mg/300 mL (1,500 mg Intravenous New Bag/Given 09/24/23 0015)  cefTRIAXone (ROCEPHIN) 1 g in sodium chloride 0.9 % 100 mL IVPB (has no administration in time range)  ondansetron (ZOFRAN) injection 4 mg (has no administration in time range)  HYDROcodone-acetaminophen (NORCO/VICODIN) 5-325 MG per tablet 1 tablet (has no administration in time range)  morphine (PF) 2 MG/ML injection 2 mg (has no administration in time range)  tamsulosin (FLOMAX) capsule 0.4 mg (has no administration in time range)  aspirin chewable tablet 81 mg (has no administration in time range)  vancomycin (VANCOREADY)  IVPB 1500 mg/300 mL (has no administration in time range)  lactated ringers bolus 1,000 mL (0 mLs Intravenous Stopped 09/23/23 1835)  ondansetron (ZOFRAN) injection 4 mg (4 mg Intravenous Given 09/23/23 1639)  morphine (PF) 2 MG/ML injection 2 mg (2 mg Intravenous Given 09/23/23 1636)  morphine (PF) 2 MG/ML injection 2 mg (2 mg Intravenous Given 09/23/23 1924)  sodium chloride 0.9 % bolus 500 mL (0 mLs Intravenous Stopped 09/23/23 2151)  morphine (PF) 4 MG/ML injection 4 mg (4 mg Intravenous Given 09/23/23 2103)  ondansetron (ZOFRAN) injection 4 mg (4 mg Intravenous Given 09/23/23 2103)  acetaminophen (TYLENOL) tablet 650 mg (650 mg Oral Given 09/23/23 2228)  ceFEPIme (MAXIPIME) 2 g in sodium chloride 0.9 % 100 mL IVPB (0 g Intravenous Stopped 09/24/23 0011)    Mobility walks with device

## 2023-09-24 NOTE — Progress Notes (Addendum)
LMOVM for nephew Mellody Dance to call me regarding pt's wife. Pt's wife has known dx of dementia and has been pleasantly confused most of the day. In addition, she has wandered out in the hallway and was looking out the window of the fire door (towards the stairs), trying to find help. And pt has apparently asked his wife for another pain pill and wife was searching their bags for his bottle of pain pills. Noted earlier that wife has her own daily meds w her (several of the 7 day pill packs), instructed both  pt and wife that he is not allowed to take his own meds for safety reasons. They both verbalized understanding. CN notified of above.

## 2023-09-24 NOTE — Evaluation (Signed)
Occupational Therapy Evaluation Patient Details Name: [name] MRN: [id number] DOB: 1934-05-06 Today's Date: 09/24/2023   History of Present Illness 89 y.o. male with medical history significant of hx of prostate cancer s/p radiation, CKD stage IV secondary to RCC s/p partial L nephrectomy, HTN, HLD, CAD s/p CABG (2007), T2DM, chronic systolic and diastolic heart failure, and TAVR 06/2018 who presents with generalized weakness and decrease oral intake.   Clinical Impression   Pt admitted with above diagnosis. Pt currently with functional limitations due to the deficits listed below (see OT Problem List). Prior to admit and recent back surgery, pt was living at home with his wife independent with all ADL tasks and functional mobility.  Pt will benefit from acute skilled OT to increase their safety and independence with ADL and functional mobility for ADL to facilitate discharge. After discussing pt's performance with nursing and pt's role at home providing cognitive assist to wife, I am recommending continued inpatient follow up therapy, <3 hours/day. Pt may progress to home health and OT will continue to assess progress and update recommendation.          If plan is discharge home, recommend the following: A little help with walking and/or transfers;A lot of help with bathing/dressing/bathroom;Assistance with cooking/housework;Help with stairs or ramp for entrance;Assist for transportation    Functional Status Assessment  Patient has had a recent decline in their functional status and demonstrates the ability to make significant improvements in function in a reasonable and predictable amount of time.  Equipment Recommendations  Other (comment) (TBD)    Recommendations for Other Services PT consult     Precautions / Restrictions Precautions Precautions: Back Precaution Booklet Issued: No Precaution Comments: Reviewed back precautions during session. Pt was able to verbalize 3/3 back  precautions although required verbal and tactile cues to maintain precautions during bed mobility and OOB activity. Restrictions Weight Bearing Restrictions: No      Mobility Bed Mobility Overal bed mobility: Needs Assistance Bed Mobility: Rolling, Sidelying to Sit Rolling: Min assist, Used rails Sidelying to sit: Min assist, Used rails, HOB elevated       General bed mobility comments: VC provided for using bed rail to assist with rolling, VC for back precautions with tactile cues and physical assist at LE to maintain.    Transfers Overall transfer level: Needs assistance Equipment used: Rolling walker (2 wheels) Transfers: Sit to/from Stand, Bed to chair/wheelchair/BSC Sit to Stand: Contact guard assist     Step pivot transfers: Contact guard assist     General transfer comment: Unable to achieve full upright standing posture. RW provided to assist. VC for hand placement with RW management prior to sit<>stand transition.      Balance Overall balance assessment: Needs assistance Sitting-balance support: Bilateral upper extremity supported, Feet supported Sitting balance-Leahy Scale: Fair Sitting balance - Comments: sitting EOB   Standing balance support: Bilateral upper extremity supported, During functional activity, Reliant on assistive device for balance Standing balance-Leahy Scale: Poor                  ADL either performed or assessed with clinical judgement   ADL Overall ADL's : Needs assistance/impaired Eating/Feeding: Set up;Sitting   Grooming: Set up;Bed level   Upper Body Bathing: Moderate assistance;Bed level   Lower Body Bathing: Total assistance;Sit to/from stand   Upper Body Dressing : Minimal assistance;Sitting   Lower Body Dressing: Total assistance;Sit to/from stand   Toilet Transfer: Minimal assistance;Ambulation;Rolling walker (2 wheels)   Toileting- Clothing  Manipulation and Hygiene: Maximal assistance;Sit to/from stand           Vision Baseline Vision/History: 1 Wears glasses Ability to See in Adequate Light: 0 Adequate Patient Visual Report: No change from baseline Vision Assessment?: Wears glasses for reading     Perception Perception: Not tested       Praxis Praxis: Not tested       Pertinent Vitals/Pain Pain Assessment Pain Assessment: 0-10 Pain Score: 6  Pain Location: low back Pain Intervention(s): Monitored during session, Limited activity within patient's tolerance     Extremity/Trunk Assessment Upper Extremity Assessment Upper Extremity Assessment: Right hand dominant;RUE deficits/detail;LUE deficits/detail RUE Deficits / Details: Able to demonstrate Active shoudler flexion to ~90 * although unable to extend arms out fully d/t pain. Pain limiting MMT. shoulder flexion/abduction: 3-/5, IR/er: 4-/5. functional gross grasp. RUE: Unable to fully assess due to pain LUE Deficits / Details: Able to demonstrate Active shoudler flexion to ~90 * although unable to extend arms out fully d/t pain. Pain limiting MMT. shoulder flexion/abduction: 3-/5, IR/er: 4-/5. functional gross grasp. LUE: Unable to fully assess due to pain   Lower Extremity Assessment Lower Extremity Assessment: Defer to PT evaluation   Cervical / Trunk Assessment Cervical / Trunk Assessment: Back Surgery;Kyphotic   Communication Communication Communication: No apparent difficulties   Cognition Arousal: Alert Behavior During Therapy: WFL for tasks assessed/performed Overall Cognitive Status: Impaired/Different from baseline Area of Impairment: Safety/judgement, Following commands, Memory      Memory:  (able to recall and verbalize precautions although unable to demonstrate independently.) Following Commands: Follows one step commands with increased time (required repeat commands) Safety/Judgement: Decreased awareness of deficits           General Comments  VSS on RA. surgical dressing on lower back intact and not  draining.            Home Living Family/patient expects to be discharged to:: Private residence Living Arrangements: Spouse/significant other Available Help at Discharge: Family;Available 24 hours/day (Has friends that could be called if needed for temporary help.) Type of Home: House Home Access: Stairs to enter [employer] of Steps: 2 Entrance Stairs-Rails:  (unsure) Home Layout: One level     Bathroom Shower/Tub: Walk-in shower;Door   Bathroom Toilet: Handicapped height     Home Equipment: Agricultural consultant (2 wheels);Cane - single point;Shower seat;Grab bars - tub/shower;Grab bars - toilet      Lives With: Spouse    Prior Functioning/Environment Prior Level of Function : Independent/Modified Independent;Driving        Mobility Comments: No AD used.          OT Problem List: Decreased strength;Pain;Decreased range of motion;Decreased cognition;Decreased activity tolerance;Decreased safety awareness;Decreased knowledge of use of DME or AE;Impaired balance (sitting and/or standing);Decreased knowledge of precautions;Impaired UE functional use      OT Treatment/Interventions: Self-care/ADL training;Therapeutic exercise;Therapeutic activities;Neuromuscular education;Cognitive remediation/compensation;Energy conservation;DME and/or AE instruction;Patient/family education;Balance training;Manual therapy;Modalities    OT Goals(Current goals can be found in the care plan section) Acute Rehab OT Goals Patient Stated Goal: to have no pain OT Goal Formulation: Patient unable to participate in goal setting Time For Goal Achievement: 10/08/23 Potential to Achieve Goals: Good  OT Frequency: Min 1X/week       AM-PAC OT "6 Clicks" Daily Activity     Outcome Measure Help from another person eating meals?: None Help from another person taking care of personal grooming?: A Little Help from another person toileting, which includes using toliet, bedpan, or urinal?:  Total  Help from another person bathing (including washing, rinsing, drying)?: Total Help from another person to put on and taking off regular upper body clothing?: A Little Help from another person to put on and taking off regular lower body clothing?: Total 6 Click Score: 13   End of Session Equipment Utilized During Treatment: Rolling walker (2 wheels) Nurse Communication: Mobility status;Other (comment) (chair alarm pad is under pt although no alarm box available in room)  Activity Tolerance: Patient tolerated treatment well;Patient limited by pain Patient left: in chair;with call bell/phone within reach;with family/visitor present  OT Visit Diagnosis: Unsteadiness on feet (R26.81);Muscle weakness (generalized) (M62.81);Pain Pain - Right/Left:  (lower) Pain - part of body:  (back)                Time: 1610-9604 OT Time Calculation (min): 35 min Charges:  OT General Charges $OT Visit: 1 Visit OT Evaluation $OT Eval High Complexity: 1 High OT Treatments $Self Care/Home Management : 8-22 mins  Limmie Patricia, OTR/L,CBIS  Supplemental OT - [facility] and [facility] Secure Chat Preferred    Elizebath Wever, Charisse March 09/24/2023, 12:53 PM

## 2023-09-24 NOTE — Progress Notes (Signed)
Pharmacy Antibiotic Note  [name] is a 89 y.o. male admitted on 09/23/2023 with l history significant of hx of prostate cancer s/p radiation, CKD stage IV secondary to RCC s/p partial L nephrectomy, HTN, HLD, CAD s/p CABG (2007), T2DM, chronic systolic and diastolic heart failure, and TAVR 06/2018 who presents with generalized weakness. .  Pharmacy has been consulted for vancomycin dosing.  Plan: Vancomycin 1500mg  q48h (AUC 527.5, Scr 1.74) Follow renal function and clinical course  Height: 5' 6.5" (168.9 cm) Weight: 74.8 kg (165 lb) IBW/kg (Calculated) : 64.95  Temp (24hrs), Avg:99.2 F (37.3 C), Min:98 F (36.7 C), Max:100.6 F (38.1 C)  Recent Labs  Lab 09/23/23 1621 09/23/23 1630 09/23/23 2237 09/24/23 0024  WBC 14.0*  --   --   --   CREATININE 1.74*  --   --   --   LATICACIDVEN  --  1.4 1.2 2.4*    Estimated Creatinine Clearance: 27.5 mL/min (A) (by C-G formula based on SCr of 1.74 mg/dL (H)).    Allergies  Allergen Reactions   Fish Allergy Swelling    Dark fish- salmon, tuna    Lipitor [Atorvastatin] Other (See Comments)    Muscle pain   Penicillins Swelling    SWELLING OF THE FEET  Has patient had a PCN reaction causing immediate rash, facial/tongue/throat swelling, SOB or lightheadedness with hypotension: no Has patient had a PCN reaction causing severe rash involving mucus membranes or skin necrosis: no Has patient had a PCN reaction that required hospitalization no Has patient had a PCN reaction occurring within the last 10 years: no If all of the above answers are "NO", then may proceed with Cephalosporin use.   Simvastatin Other (See Comments)    MUSCLE PAIN   Adacel [Tetanus-Diphth-Acell Pertussis]     UNSPECIFIED REACTION    Lasix [Furosemide] Swelling    SWELLING REACTION UNSPECIFIED    Septra [Sulfamethoxazole-Trimethoprim]     UNSPECIFIED REACTION    Flexeril [Cyclobenzaprine] Other (See Comments)    Causes confusion   Pantoprazole Diarrhea     Antimicrobials this admission: 10/3 cefepime x 1 10/4 CTX >> 10/4 vanc >>  Dose adjustments this admission:   Microbiology results: 10/3 BCx:   Thank you for allowing pharmacy to be a part of this patient's care.  Arley Phenix RPh 09/24/2023, 12:39 AM

## 2023-09-24 NOTE — Progress Notes (Signed)
Spoke w pt's nephew and relayed numerous concerns about pt's wife and being responsible for her own personal safety, as well as the safety of the pt. Nephew states he has no concerns about her safety "as long as we watch her." Explained that this is not possible and that we need a family or friend to come and pick up pt's wife, that she will not be allowed to spend the night w the pt. Nephew stated that she had no pain pills in her pill boxes but did have some Xanax w her. Multiple questions answered and nephew states that he will send her neighbor over here to pick her up, that he will make arrangements for her to have a safe environment.

## 2023-09-24 NOTE — Evaluation (Signed)
Physical Therapy Evaluation Patient Details Name: [name] MRN: [id number] DOB: [birth year] Today's Date: 09/24/2023  History of Present Illness  89 y.o. male with degenerative scoliosis and was admitted for elective lumbar stabilization with L2-4 lumbar spinal fusion on 9/16 at [facility]. He refused rehab at discharge and since being home he has not be able to eat or drink much for at least 10 days. Has continuous back pain. No abdominal pain. No nausea, vomiting or diarrhea. He finally decided to start home PT and today PT came and prompted him to present to ED.  Past medical history significant of hx of prostate cancer s/p radiation, CKD stage IV secondary to RCC s/p partial L nephrectomy, HTN, HLD, CAD s/p CABG (2007), T2DM, chronic systolic and diastolic heart failure, and TAVR 06/2018  Clinical Impression  Pt admitted with above diagnosis.  Pt currently with functional limitations due to the deficits listed below (see PT Problem List). Pt will benefit from acute skilled PT to increase their independence and safety with mobility to allow discharge.  Pt in recliner on arrival to room and had been sitting for 3 hours.  Pt requesting to return to bed.  Pt encouraged to ambulate if possible however he felt too fatigued and having pain so assisted back to bed.  Pt reports not mobilizing much at home after back surgery due to pain.  Per notes, home physical therapist prompted ED visit due to his failure to thrive.   Patient would benefit from continued inpatient follow up therapy, <3 hours/day.          If plan is discharge home, recommend the following: A little help with walking and/or transfers;A little help with bathing/dressing/bathroom;Assistance with cooking/housework;Assist for transportation;Help with stairs or ramp for entrance   Can travel by private vehicle   No    Equipment Recommendations None recommended by PT  Recommendations for Other Services        Functional Status Assessment Patient has had a recent decline in their functional status and demonstrates the ability to make significant improvements in function in a reasonable and predictable amount of time.     Precautions / Restrictions Precautions Precautions: Back;Fall Precaution Booklet Issued: No Precaution Comments: reviewed while mobilizing Restrictions Weight Bearing Restrictions: No      Mobility  Bed Mobility Overal bed mobility: Needs Assistance Bed Mobility: Sit to Sidelying         Sit to sidelying: Mod assist, Used rails General bed mobility comments: pt aware to use log roll technique, assis required for bil LEs onto bed; pt preferred to remain in sidelying position, placed pillow between legs for comfort    Transfers Overall transfer level: Needs assistance Equipment used: Rolling walker (2 wheels) Transfers: Sit to/from Stand, Bed to chair/wheelchair/BSC Sit to Stand: Min assist   Step pivot transfers: Contact guard assist       General transfer comment: verbal cues for hand placement, assist to rise and steady, pain limiting    Ambulation/Gait               General Gait Details: pt felt unable due to fatigue and pain  Stairs            Wheelchair Mobility     Tilt Bed    Modified Rankin (Stroke Patients Only)       Balance           Standing balance support: Bilateral upper extremity supported, During functional activity, Reliant on assistive device for  balance Standing balance-Leahy Scale: Poor                               Pertinent Vitals/Pain Pain Assessment Pain Assessment: 0-10 Pain Score: 8  Pain Location: low back Pain Descriptors / Indicators: Grimacing, Sore Pain Intervention(s): Repositioned, Monitored during session, Patient requesting pain meds-RN notified    Home Living Family/patient expects to be discharged to:: Private residence Living Arrangements: Spouse/significant  other Available Help at Discharge: Family;Available 24 hours/day (Has friends that could be called if needed for temporary help.) Type of Home: House Home Access: Stairs to enter Entrance Stairs-Rails:  (unsure) Entrance Stairs-Number of Steps: 2   Home Layout: One level Home Equipment: Agricultural consultant (2 wheels);Cane - single point;Shower seat;Grab bars - tub/shower;Grab bars - toilet      Prior Function Prior Level of Function : Independent/Modified Independent;Driving             Mobility Comments: No AD used.  Pt with recent back surgery and mainly in bed due to pain per his report       Extremity/Trunk Assessment   Upper Extremity Assessment Upper Extremity Assessment: Right hand dominant;RUE deficits/detail;LUE deficits/detail RUE Deficits / Details: Able to demonstrate Active shoudler flexion to ~90 * although unable to extend arms out fully d/t pain. Pain limiting MMT. shoulder flexion/abduction: 3-/5, IR/er: 4-/5. functional gross grasp. RUE: Unable to fully assess due to pain LUE Deficits / Details: Able to demonstrate Active shoudler flexion to ~90 * although unable to extend arms out fully d/t pain. Pain limiting MMT. shoulder flexion/abduction: 3-/5, IR/er: 4-/5. functional gross grasp. LUE: Unable to fully assess due to pain    Lower Extremity Assessment Lower Extremity Assessment: Generalized weakness    Cervical / Trunk Assessment Cervical / Trunk Assessment: Back Surgery;Kyphotic  Communication   Communication Communication: No apparent difficulties  Cognition Arousal: Alert Behavior During Therapy: WFL for tasks assessed/performed Overall Cognitive Status: Impaired/Different from baseline Area of Impairment: Following commands                       Following Commands: Follows one step commands with increased time                General Comments General comments (skin integrity, edema, etc.): VSS on RA. surgical dressing on lower back  intact and not draining.    Exercises     Assessment/Plan    PT Assessment Patient needs continued PT services  PT Problem List Decreased strength;Decreased activity tolerance;Decreased mobility;Pain;Decreased knowledge of use of DME;Decreased knowledge of precautions;Decreased cognition;Decreased balance       PT Treatment Interventions Gait training;DME instruction;Balance training;Functional mobility training;Therapeutic activities;Therapeutic exercise;Patient/family education    PT Goals (Current goals can be found in the Care Plan section)  Acute Rehab PT Goals PT Goal Formulation: With patient Time For Goal Achievement: 10/08/23 Potential to Achieve Goals: Good    Frequency Min 1X/week     Co-evaluation               AM-PAC PT "6 Clicks" Mobility  Outcome Measure Help needed turning from your back to your side while in a flat bed without using bedrails?: A Little Help needed moving from lying on your back to sitting on the side of a flat bed without using bedrails?: A Little Help needed moving to and from a bed to a chair (including a wheelchair)?: A Lot Help needed standing up  from a chair using your arms (e.g., wheelchair or bedside chair)?: A Lot Help needed to walk in hospital room?: A Lot Help needed climbing 3-5 steps with a railing? : A Lot 6 Click Score: 14    End of Session Equipment Utilized During Treatment: Gait belt Activity Tolerance: Patient tolerated treatment well Patient left: in bed;with call bell/phone within reach;with family/visitor present;with bed alarm set Nurse Communication: Mobility status;Patient requests pain meds PT Visit Diagnosis: Difficulty in walking, not elsewhere classified (R26.2);Pain Pain - part of body:  (back)    Time: 7846-9629 PT Time Calculation (min) (ACUTE ONLY): 10 min   Charges:   PT Evaluation $PT Eval Low Complexity: 1 Low   PT General Charges $$ ACUTE PT VISIT: 1 Visit        Thomasene Mohair PT,  DPT Physical Therapist Acute Rehabilitation Services Office: [phone]   Janan Halter [place] 09/24/2023, 3:31 PM

## 2023-09-24 NOTE — Assessment & Plan Note (Signed)
-  presented with fever, tachycardia and leukocytosis with grossly positive UA -start IV Rocephin. Has received IV cefepime in ED.  -Urine culture pending -continue IV fluid

## 2023-09-24 NOTE — Progress Notes (Signed)
Pharmacy Antibiotic Note  [name] is a 89 y.o. male admitted on 09/23/2023 with l history significant of hx of prostate cancer s/p radiation, CKD stage IV secondary to RCC s/p partial L nephrectomy, HTN, HLD, CAD s/p CABG (2007), T2DM, chronic systolic and diastolic heart failure, and TAVR 06/2018 who presents with generalized weakness. Pharmacy previously consulted for vancomycin dosing. Now adding cefepime.  Plan: -Stop ceftriaxone and start cefepime 2 g IV q12h -Continue vancomycin 1500mg  q48h -Follow renal function, cultures and clinical course for dose adjustments and de-escalation as indicated  Height: 5' 6.5" (168.9 cm) Weight: 74.8 kg (165 lb) IBW/kg (Calculated) : 64.95  Temp (24hrs), Avg:99 F (37.2 C), Min:98.2 F (36.8 C), Max:100.6 F (38.1 C)  Recent Labs  Lab 09/23/23 1621 09/23/23 1630 09/23/23 2237 09/24/23 0024 09/24/23 0818 09/24/23 1103  WBC 14.0*  --   --   --  12.0*  --   CREATININE 1.74*  --   --   --  1.50*  --   LATICACIDVEN  --  1.4 1.2 2.4* 1.0 1.2    Estimated Creatinine Clearance: 31.9 mL/min (A) (by C-G formula based on SCr of 1.5 mg/dL (H)).    Allergies  Allergen Reactions   Fish Allergy Swelling    Dark fish- salmon, tuna    Lipitor [Atorvastatin] Other (See Comments)    Muscle pain   Penicillins Swelling    SWELLING OF THE FEET  Has patient had a PCN reaction causing immediate rash, facial/tongue/throat swelling, SOB or lightheadedness with hypotension: no Has patient had a PCN reaction causing severe rash involving mucus membranes or skin necrosis: no Has patient had a PCN reaction that required hospitalization no Has patient had a PCN reaction occurring within the last 10 years: no If all of the above answers are "NO", then may proceed with Cephalosporin use.   Simvastatin Other (See Comments)    MUSCLE PAIN   Adacel [Tetanus-Diphth-Acell Pertussis]     UNSPECIFIED REACTION    Lasix [Furosemide] Swelling    SWELLING REACTION  UNSPECIFIED    Septra [Sulfamethoxazole-Trimethoprim]     UNSPECIFIED REACTION    Flexeril [Cyclobenzaprine] Other (See Comments)    Causes confusion   Pantoprazole Diarrhea    Antimicrobials this admission: Cefepime 10/3 >> Vanc 10/4 >> CTX 10/4 x 1  Microbiology results: 10/3 BCx:   Thank you for allowing pharmacy to be a part of this patient's care.  Pricilla Riffle, PharmD, BCPS Clinical Pharmacist 09/24/2023 6:22 PM

## 2023-09-24 NOTE — Progress Notes (Signed)
[facility] Progress Note Patient: [name] QIO:[id number] DOB: 30-Oct-1934 DOA: 09/23/2023  DOS: the patient was seen and examined on 09/24/2023  Brief hospital course: PMH of prostate cancer SP radiation, CKD stage IV, RCC SP nephrectomy, HTN, HLD, CAD SP CABG, type II DM, chronic combined CHF, TAVR, chronic back pain. Presented with generalized weakness back pain and decreased oral intake. On 9/16 underwent L2 L4 lumbar spinal fusion with [facility] throughout Dr. Lorenso Courier. Currently being treated for wound infection.  Assessment and Plan: Surgical wound infection with cellulitis. Sepsis secondary to UTI. Meeting SIRS criteria with fever, tachycardia and leukocytosis. The UA also shows evidence of positive nitrites. Currently on IV antibiotic. Follow-up on cultures. Changing Rocephin to cefepime to 2 need for coverage for recent surgical intervention. Wound appears to be intact for now although has surrounding cellulitis. No evidence of purulent discharge. MRI spine also negative for any epidural abscess, or myositis, or pyomyositis, or osteomyelitis or discitis. Discussed with neurosurgery here Dr. Lupita Dawn as well as patient's neurosurgeon Dr. Lorenso Courier. Recommend conservative measures with antibiotics for now and outpatient follow-up.  History of CAD, CABG, TAVR. Continue aspirin.  Hypertension. Blood pressure soft. Likely in the setting of pain as well as pain management as well as infection Monitor for now.  Chronic combined CHF. Volume status adequate. Monitor.  Chronic pain. Patient takes Norco every 4 hours scheduled at home. Will continue the same as well as as needed pain medication.  ?  Encephalopathy. Patient does have some asterixis. Etiology not clear. For now we will monitor.  Most likely metabolic encephalopathy in the setting of infection.   Subjective: No nausea no vomiting no fever no chills.  Back pain improving.  Physical Exam: General: in Mild  distress, No Rash Cardiovascular: S1 and S2 Present, No Murmur Respiratory: Good respiratory effort, Bilateral Air entry present. No Crackles, No wheezes Abdomen: Bowel Sound present, No tenderness Extremities: No edema Neuro: Alert and oriented x3, no new focal deficit   Data Reviewed: I have Reviewed nursing notes, Vitals, and Lab results. Since last encounter, pertinent lab results CBC and CMP   . I have ordered test including CBC CMP ESR CRP  . I have discussed pt's care plan and test results with neurosurgery  .   Disposition: Status is: Inpatient Remains inpatient appropriate because: Need for IV antibiotics  SCDs Start: 09/24/23 0033   Family Communication: Discussed with wife at bedside Level of care: Telemetry continue telemetry due to hypotension Vitals:   09/24/23 0202 09/24/23 0557 09/24/23 1002 09/24/23 1357  BP: (!) 112/55 (!) 98/58 (!) 103/55 (!) 100/59  Pulse: (!) 103 99 98 96  Resp: 18 18 15 17   Temp: 98.2 F (36.8 C) 98.3 F (36.8 C) 98.8 F (37.1 C) 99 F (37.2 C)  TempSrc:  Oral  Oral  SpO2: 97% 95% 95% 96%  Weight:      Height:         Author: Lynden Oxford, MD 09/24/2023 6:37 PM  Please look on [email or web address] to find out who is on call.

## 2023-09-24 NOTE — TOC Initial Note (Addendum)
Transition of Care [facility]) - Initial/Assessment Note    Patient Details  Name: [name] MRN: [id number] Date of Birth: 10-03-1934  Transition of Care [facility]) CM/SW Contact:    Harriett Sine, RN Phone Number:[phone]  09/24/2023, 1:14 PM  Clinical Narrative:                 Pt is from home with spouse. Pt has pcp, Spoke with pt and spouse at bedside about HH services, DME, and d/c process. Pt has no current HH services and no SDOH needs at this time. TOC following for d/c needs.   Expected Discharge Plan: Home w Home Health Services Barriers to Discharge: No Barriers Identified   Patient Goals and CMS Choice Patient states their goals for this hospitalization and ongoing recovery are:: to my wound healed CMS Medicare.gov Compare Post Acute Care list provided to:: Patient Choice offered to / list presented to :  (wainting on recommendation)      Expected Discharge Plan and Services In-house Referral: NA Discharge Planning Services: NA   Living arrangements for the past 2 months: Single Family Home                                      Prior Living Arrangements/Services Living arrangements for the past 2 months: Single Family Home Lives with:: Self, Spouse Patient language and need for interpreter reviewed:: Yes Do you feel safe going back to the place where you live?: Yes      Need for Family Participation in Patient Care: Yes (Comment)     Criminal Activity/Legal Involvement Pertinent to Current Situation/Hospitalization: No - Comment as needed  Activities of Daily Living   ADL Screening (condition at time of admission) Independently performs ADLs?: No Does the patient have a NEW difficulty with bathing/dressing/toileting/self-feeding that is expected to last >3 days?: Yes (Initiates electronic notice to provider for possible OT consult) Does the patient have a NEW difficulty with getting in/out of bed, walking, or climbing stairs that is expected to last  >3 days?: Yes (Initiates electronic notice to provider for possible PT consult) Does the patient have a NEW difficulty with communication that is expected to last >3 days?: Yes (Initiates electronic notice to provider for possible SLP consult) Is the patient deaf or have difficulty hearing?: No Does the patient have difficulty seeing, even when wearing glasses/contacts?: No Does the patient have difficulty concentrating, remembering, or making decisions?: No  Permission Sought/Granted Permission sought to share information with : Magazine features editor, Other (comment) (SNF or HH agency depending on recommendation) Permission granted to share information with : Yes, Verbal Permission Granted              Emotional Assessment Appearance:: Appears stated age Attitude/Demeanor/Rapport: Engaged   Orientation: : Oriented to Self, Oriented to Place, Oriented to  Time, Oriented to Situation Alcohol / Substance Use:  (guit smoking 44 years age) Psych Involvement: No (comment)  Admission diagnosis:  Generalized weakness [R53.1] Sepsis secondary to UTI (HCC) [A41.9, N39.0] Sepsis, due to unspecified organism, unspecified whether acute organ dysfunction present [facility]) [A41.9] Patient Active Problem List   Diagnosis Date Noted   Hypotension 09/24/2023   Wound cellulitis after surgery 09/24/2023   S/P CABG (coronary artery bypass graft) 07/12/2023   Myalgia due to statin 07/12/2023   History of renal cell cancer    Diabetes mellitus (HCC)    Prostate cancer (HCC)  Coronary artery disease    Chronic combined systolic and diastolic heart failure (HCC)    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   Discitis 12/22/2015   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   Sepsis secondary to UTI (HCC) 10/26/2015   CKD (chronic kidney disease), stage III (HCC) 10/26/2015   Essential hypertension 02/11/2015   Hyperlipidemia associated with type 2 diabetes mellitus (HCC)    GERD  (gastroesophageal reflux disease)    H/O blood clots    Low back pain    Polymyalgia (HCC)    Gout    Glaucoma    PCP:  Emilio Aspen, MD Pharmacy:   [facility] - [place], [place] - [address] [place] [address] Phone: [phone] Fax: [phone]     Social Determinants of Health (SDOH) Social History: SDOH Screenings   Food Insecurity: No Food Insecurity (09/24/2023)  Housing: High Risk (09/24/2023)  Transportation Needs: No Transportation Needs (09/24/2023)  Utilities: Not At Risk (09/24/2023)  Social Connections: Unknown (05/05/2022)   Received from [facility], [facility]  Stress: No Stress Concern Present (09/06/2023)   Received from [facility]  Tobacco Use: Medium Risk (09/23/2023)   SDOH Interventions:     Readmission Risk Interventions     No data to display

## 2023-09-24 NOTE — Plan of Care (Signed)
  Problem: Activity: Goal: Ability to return to baseline activity level will improve Outcome: Progressing   Problem: Education: Goal: Knowledge of General Education information will improve Description: Including pain rating scale, medication(s)/side effects and non-pharmacologic comfort measures Outcome: Progressing   Problem: Clinical Measurements: Goal: Respiratory complications will improve Outcome: Progressing   Problem: Nutrition: Goal: Adequate nutrition will be maintained Outcome: Progressing

## 2023-09-24 NOTE — Assessment & Plan Note (Signed)
-  Not able to verify if he is on statin. pt to provide medication list tomorrow.

## 2023-09-24 NOTE — Assessment & Plan Note (Addendum)
-  appears to have mild cellulitis to lumbar spinal incision wound s/p lumbar fusion on 9/16 at [facility] -continue IV vancomycin and Rocephin  -wound care consulted  -neurosurgery consulted and will evaluate. MRI L-spine did not have signs of infection or abscess.

## 2023-09-25 DIAGNOSIS — N390 Urinary tract infection, site not specified: Secondary | ICD-10-CM | POA: Diagnosis not present

## 2023-09-25 DIAGNOSIS — A419 Sepsis, unspecified organism: Secondary | ICD-10-CM | POA: Diagnosis not present

## 2023-09-25 MED ORDER — MELATONIN 3 MG PO TABS
3.0000 mg | ORAL_TABLET | Freq: Every evening | ORAL | Status: DC | PRN
  Administered 2023-09-25 (×2): 3 mg via ORAL
  Filled 2023-09-25 (×2): qty 1

## 2023-09-25 NOTE — Progress Notes (Signed)
[facility] Progress Note Patient: [name] UJW:[id number] DOB: 18-Dec-1934 DOA: 09/23/2023  DOS: the patient was seen and examined on 09/25/2023  Brief hospital course: PMH of prostate cancer SP radiation, CKD stage IV, RCC SP nephrectomy, HTN, HLD, CAD SP CABG, type II DM, chronic combined CHF, TAVR, chronic back pain. Presented with generalized weakness back pain and decreased oral intake. On 9/16 underwent L2 L4 lumbar spinal fusion with [facility] throughout Dr. Lorenso Courier. Discussed with neurosurgery, Dr Danie Binder and Dr. Lorenso Courier. Currently being treated for wound infection.  Assessment and Plan: Surgical wound infection with cellulitis. Sepsis secondary to UTI. Meeting SIRS criteria with fever, tachycardia and leukocytosis. The UA also shows evidence of positive nitrites. Currently on IV antibiotic. Follow-up on cultures.  Unfortunately no urine cultures performed. Blood cultures so far negative for 1 day. On IV cefepime as well as IV vancomycin. Wound appears to be intact for now although has surrounding cellulitis. No evidence of purulent discharge. MRI spine also negative for any epidural abscess, or myositis, or pyomyositis, or osteomyelitis or discitis. Discussed with neurosurgery here Dr. Lupita Dawn as well as patient's neurosurgeon Dr. Lorenso Courier. Recommend conservative measures with antibiotics for now and outpatient follow-up.  History of CAD, CABG, TAVR. Continue aspirin.  Hypertension. Blood pressure soft. Likely in the setting of pain as well as pain management as well as infection Monitor for now.  Chronic combined CHF. Volume status adequate. Monitor.  Chronic pain. Patient takes Norco every 4 hours scheduled at home. Will continue the same as well as as needed pain medication.  Acute metabolic Encephalopathy. Patient does have some asterixis. Etiology not clear. Resolved. For now we will monitor.  Diarrhea. RN reports 2-3 loose BM in 24 hours. Will check  C. difficile as the patient was on antibiotic as well as recently hospitalized.   Subjective: No nausea vomiting fever no chills.  Back pain still present but improving.  Reported diarrhea.  Physical Exam: General: in Mild distress, No Rash Cardiovascular: S1 and S2 Present, No Murmur Respiratory: Good respiratory effort, Bilateral Air entry present. No Crackles, No wheezes Abdomen: Bowel Sound present, No tenderness Extremities: No edema Neuro: Alert and oriented x3, no new focal deficit  Data Reviewed: I have Reviewed nursing notes, Vitals, and Lab results. Since last encounter, pertinent lab results CBC and BMP   . I have ordered test including CBC and BMP  .  Disposition: Status is: Inpatient Remains inpatient appropriate because: Need for further workup  SCDs Start: 09/24/23 0033   Family Communication: No one at bedside Level of care: Med-Surg   Vitals:   09/24/23 1357 09/24/23 2031 09/25/23 0518 09/25/23 1411  BP: (!) 100/59 131/76 121/60 121/60  Pulse: 96 (!) 109 (!) 103 99  Resp: 17 18    Temp: 99 F (37.2 C) 98.6 F (37 C) 98.7 F (37.1 C) 98.8 F (37.1 C)  TempSrc: Oral Oral Oral   SpO2: 96% 97% 95% 97%  Weight:      Height:         Author: Lynden Oxford, MD 09/25/2023 6:38 PM  Please look on [email or web address] to find out who is on call.

## 2023-09-25 NOTE — Progress Notes (Signed)
Lab called to reject stool sample because it was formed and not loose

## 2023-09-25 NOTE — TOC Progression Note (Addendum)
Transition of Care [facility]) - Progression Note    Patient Details  Name: [name] MRN: [id number] Date of Birth: 10-10-1934  [facility]) CM/SW Contact  Adrian Prows, RN Phone Number: 09/25/2023, 3:54 PM  Clinical Narrative:    PT recc SNF; spoke w/ pt, wife Jamesetta So, and niece Doreene Eland [phone]; she also gave POC her brother [name] ([phone]); they agree to SNF placement; preference is [facility] b/c a facility that will accept pt and his wife; explained dept is IP only, and family will have to contact facility for admission of wife; she verbalized understanding; explained SNF process; pt says he wears reading glasses, and he has upper dentures; pt says he is continent of bowel/bladder, and he can feed himself; there is a wound on his back; PASRR # [id number]; faxed out; awaiting bed offers; SNF auth.   Expected Discharge Plan: Home w Home Health Services Barriers to Discharge: No Barriers Identified  Expected Discharge Plan and Services In-house Referral: NA Discharge Planning Services: NA   Living arrangements for the past 2 months: Single Family Home                                       Social Determinants of Health (SDOH) Interventions SDOH Screenings   Food Insecurity: No Food Insecurity (09/24/2023)  Housing: High Risk (09/24/2023)  Transportation Needs: No Transportation Needs (09/24/2023)  Utilities: Not At Risk (09/24/2023)  Social Connections: Unknown (05/05/2022)   Received from [facility], [facility]  Stress: No Stress Concern Present (09/06/2023)   Received from [facility]  Tobacco Use: Medium Risk (09/23/2023)    Readmission Risk Interventions     No data to display

## 2023-09-25 NOTE — [place] FL2 (Signed)
[place] MEDICAID FL2 LEVEL OF CARE FORM     IDENTIFICATION  Patient Name: [name] Birthdate: 1934-11-23 Sex: male Admission Date (Current Location): 09/23/2023  [facility] and [place] Number:  Producer, television/film/video and Address:  [facility],  [address] [place], [place] [address]      Provider Number: [id number]  Attending Physician Name and Address:  Rolly Salter, MD  Relative Name and Phone Number:  Khylon Eberling (spouse) [phone]    Current Level of Care: Hospital Recommended Level of Care: Skilled Nursing Facility Prior Approval Number:    Date Approved/Denied:   PASRR Number: [id number]  Discharge Plan: SNF    Current Diagnoses: Patient Active Problem List   Diagnosis Date Noted   Hypotension 09/24/2023   Wound cellulitis after surgery 09/24/2023   S/P CABG (coronary artery bypass graft) 07/12/2023   Myalgia due to statin 07/12/2023   History of renal cell cancer    Diabetes mellitus (HCC)    Prostate cancer [facility])    Coronary artery disease    Chronic combined systolic and diastolic heart failure (HCC)    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   Discitis 12/22/2015   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   Sepsis secondary to UTI (HCC) 10/26/2015   CKD (chronic kidney disease), stage III (HCC) 10/26/2015   Essential hypertension 02/11/2015   Hyperlipidemia associated with type 2 diabetes mellitus (HCC)    GERD (gastroesophageal reflux disease)    H/O blood clots    Low back pain    Polymyalgia (HCC)    Gout    Glaucoma     Orientation RESPIRATION BLADDER Height & Weight     Self, Time, Situation, Place  Normal Continent Weight: 74.8 kg Height:  5' 6.5" (168.9 cm)  BEHAVIORAL SYMPTOMS/MOOD NEUROLOGICAL BOWEL NUTRITION STATUS      Continent Diet (heart healthy)  AMBULATORY STATUS COMMUNICATION OF NEEDS Skin   Limited Assist Verbally Surgical wounds (surgical incision veterbral column)                        Personal Care Assistance Level of Assistance  Bathing, Feeding, Dressing Bathing Assistance: Maximum assistance Feeding assistance: Independent Dressing Assistance: Maximum assistance     Functional Limitations Info  Sight, Hearing, Speech Sight Info: Impaired Hearing Info: Adequate Speech Info: Adequate    SPECIAL CARE FACTORS FREQUENCY  PT (By licensed PT), OT (By licensed OT)     PT Frequency: 5x/week OT Frequency: 5x/week            Contractures Contractures Info: Not present    Additional Factors Info  Code Status, Allergies, Isolation Precautions Code Status Info: Full Allergies Info: Fish Allergy, Lipitor (Atorvastatin), Penicillins, Simvastatin, Adacel (Tetanus-diphth-acell Pertussis), Lasix (Furosemide), Septra (Sulfamethoxazole-trimethoprim), Flexeril (Cyclobenzaprine), Pantoprazole     Isolation Precautions Info: Enteric     Current Medications (09/25/2023):  This is the current hospital active medication list Current Facility-Administered Medications  Medication Dose Route Frequency Provider Last Rate Last Admin   aspirin chewable tablet 81 mg  81 mg Oral QPM Tu, Ching T, DO   81 mg at 09/24/23 1802   ceFEPIme (MAXIPIME) 2 g in sodium chloride 0.9 % 100 mL IVPB  2 g Intravenous Q12H [place], Abby K, RPH 200 mL/hr at 09/25/23 0758 2 g at 09/25/23 0758   HYDROcodone-acetaminophen (NORCO/VICODIN) 5-325 MG per tablet 1 tablet  1 tablet Oral Q4H PRN Rolly Salter, MD   1 tablet at 09/25/23 1529  melatonin tablet 3 mg  3 mg Oral QHS PRN Lewie Chamber, MD   3 mg at 09/25/23 0159   morphine (PF) 2 MG/ML injection 2 mg  2 mg Intravenous Q2H PRN Rolly Salter, MD       ondansetron [facility]) injection 4 mg  4 mg Intravenous Q6H PRN Tu, Ching T, DO       tamsulosin (FLOMAX) capsule 0.4 mg  0.4 mg Oral BID Tu, Ching T, DO   0.4 mg at 09/25/23 0922   [START ON 09/26/2023] vancomycin (VANCOREADY) IVPB 1500 mg/300 mL  1,500 mg Intravenous Q48H Maurice March, [facility]          Discharge Medications: Please see discharge summary for a list of discharge medications.  Relevant Imaging Results:  Relevant Lab Results:   Additional Information SSN [id number]  Adrian Prows, RN

## 2023-09-25 NOTE — Plan of Care (Signed)
  Problem: Activity: Goal: Ability to return to baseline activity level will improve Outcome: Progressing   Problem: Health Behavior/Discharge Planning: Goal: Ability to safely manage health-related needs after discharge will improve Outcome: Progressing   Problem: Education: Goal: Knowledge of General Education information will improve Description: Including pain rating scale, medication(s)/side effects and non-pharmacologic comfort measures Outcome: Progressing   Problem: Health Behavior/Discharge Planning: Goal: Ability to manage health-related needs will improve Outcome: Progressing   Problem: Clinical Measurements: Goal: Will remain free from infection Outcome: Progressing Goal: Diagnostic test results will improve Outcome: Progressing   Problem: Nutrition: Goal: Adequate nutrition will be maintained Outcome: Progressing   Problem: Elimination: Goal: Will not experience complications related to bowel motility Outcome: Progressing   Problem: Pain Managment: Goal: General experience of comfort will improve Outcome: Progressing   Problem: Safety: Goal: Ability to remain free from injury will improve Outcome: Progressing   Problem: Skin Integrity: Goal: Risk for impaired skin integrity will decrease Outcome: Progressing

## 2023-09-26 DIAGNOSIS — A419 Sepsis, unspecified organism: Secondary | ICD-10-CM | POA: Diagnosis not present

## 2023-09-26 DIAGNOSIS — N390 Urinary tract infection, site not specified: Secondary | ICD-10-CM | POA: Diagnosis not present

## 2023-09-26 LAB — BASIC METABOLIC PANEL
Anion gap: 12 (ref 5–15)
BUN: 17 mg/dL (ref 8–23)
CO2: 23 mmol/L (ref 22–32)
Calcium: 9.2 mg/dL (ref 8.9–10.3)
Chloride: 101 mmol/L (ref 98–111)
Creatinine, Ser: 1 mg/dL (ref 0.61–1.24)
GFR, Estimated: >60 mL/min (ref >60)
Glucose, Bld: 179 mg/dL — ABNORMAL HIGH (ref 70–99)
Potassium: 4 mmol/L (ref 3.5–5.1)
Sodium: 136 mmol/L (ref 135–145)

## 2023-09-26 LAB — MAGNESIUM: Magnesium: 1.8 mg/dL (ref 1.7–2.4)

## 2023-09-26 LAB — CBC
HCT: 29.5 % — ABNORMAL LOW (ref 39.0–52.0)
Hemoglobin: 9.2 g/dL — ABNORMAL LOW (ref 13.0–17.0)
MCH: 31.5 pg (ref 26.0–34.0)
MCHC: 31.2 g/dL (ref 30.0–36.0)
MCV: 101 fL — ABNORMAL HIGH (ref 80.0–100.0)
Platelets: 334 10*3/uL (ref 150–400)
RBC: 2.92 MIL/uL — ABNORMAL LOW (ref 4.22–5.81)
RDW: 13.6 % (ref 11.5–15.5)
WBC: 9.3 10*3/uL (ref 4.0–10.5)
nRBC: 0 % (ref 0.0–0.2)

## 2023-09-26 MED ORDER — HALOPERIDOL LACTATE 5 MG/ML IJ SOLN
3.0000 mg | Freq: Once | INTRAMUSCULAR | Status: AC | PRN
  Administered 2023-09-27: 3 mg via INTRAMUSCULAR
  Filled 2023-09-26: qty 1

## 2023-09-26 MED ORDER — ZINC SULFATE 220 (50 ZN) MG PO CAPS
220.0000 mg | ORAL_CAPSULE | Freq: Every day | ORAL | Status: DC
  Administered 2023-09-26 – 2023-09-30 (×5): 220 mg via ORAL
  Filled 2023-09-26 (×5): qty 1

## 2023-09-26 MED ORDER — DOXYCYCLINE HYCLATE 100 MG PO TABS
100.0000 mg | ORAL_TABLET | Freq: Two times a day (BID) | ORAL | Status: DC
  Administered 2023-09-26 – 2023-09-30 (×9): 100 mg via ORAL
  Filled 2023-09-26 (×10): qty 1

## 2023-09-26 MED ORDER — LIDOCAINE 5 % EX PTCH
1.0000 | MEDICATED_PATCH | Freq: Every day | CUTANEOUS | Status: DC
  Administered 2023-09-26 – 2023-09-30 (×5): 1 via TRANSDERMAL
  Filled 2023-09-26 (×5): qty 1

## 2023-09-26 MED ORDER — CIPROFLOXACIN HCL 500 MG PO TABS
500.0000 mg | ORAL_TABLET | Freq: Two times a day (BID) | ORAL | Status: DC
  Administered 2023-09-26 – 2023-09-30 (×9): 500 mg via ORAL
  Filled 2023-09-26 (×9): qty 1

## 2023-09-26 MED ORDER — OXYCODONE HCL 5 MG PO TABS
5.0000 mg | ORAL_TABLET | ORAL | Status: DC | PRN
  Administered 2023-09-27 (×2): 5 mg via ORAL
  Filled 2023-09-26 (×2): qty 1

## 2023-09-26 MED ORDER — ASPIRIN 81 MG PO TBEC
81.0000 mg | DELAYED_RELEASE_TABLET | Freq: Every day | ORAL | Status: DC
  Administered 2023-09-26 – 2023-09-30 (×5): 81 mg via ORAL
  Filled 2023-09-26 (×5): qty 1

## 2023-09-26 MED ORDER — ENSURE MAX PROTEIN PO LIQD
11.0000 [oz_av] | Freq: Every day | ORAL | Status: DC
  Administered 2023-09-26 – 2023-09-30 (×5): 11 [oz_av] via ORAL
  Filled 2023-09-26 (×5): qty 330

## 2023-09-26 MED ORDER — JUVEN PO PACK
1.0000 | PACK | Freq: Two times a day (BID) | ORAL | Status: DC
  Administered 2023-09-26 – 2023-09-30 (×8): 1 via ORAL
  Filled 2023-09-26 (×8): qty 1

## 2023-09-26 MED ORDER — QUETIAPINE FUMARATE 25 MG PO TABS
12.5000 mg | ORAL_TABLET | Freq: Every evening | ORAL | Status: DC
  Administered 2023-09-26: 12.5 mg via ORAL
  Filled 2023-09-26: qty 1

## 2023-09-26 MED ORDER — VITAMIN C 500 MG PO TABS
500.0000 mg | ORAL_TABLET | Freq: Every day | ORAL | Status: DC
  Administered 2023-09-26 – 2023-09-30 (×5): 500 mg via ORAL
  Filled 2023-09-26 (×5): qty 1

## 2023-09-26 MED ORDER — POLYETHYLENE GLYCOL 3350 17 G PO PACK
17.0000 g | PACK | Freq: Every day | ORAL | Status: DC | PRN
  Administered 2023-09-27: 17 g via ORAL
  Filled 2023-09-26: qty 1

## 2023-09-26 MED ORDER — SACCHAROMYCES BOULARDII 250 MG PO CAPS: 250.0000 mg | ORAL_CAPSULE | Freq: Two times a day (BID) | ORAL | Status: DC

## 2023-09-26 MED ORDER — MELATONIN 5 MG PO TABS
5.0000 mg | ORAL_TABLET | Freq: Every day | ORAL | Status: DC
  Administered 2023-09-26 – 2023-09-30 (×4): 5 mg via ORAL
  Filled 2023-09-26 (×4): qty 1

## 2023-09-26 MED ORDER — MORPHINE SULFATE (PF) 2 MG/ML IV SOLN: 2.0000 mg | INTRAVENOUS | Status: DC | PRN

## 2023-09-26 MED ORDER — ACETAMINOPHEN 500 MG PO TABS
1000.0000 mg | ORAL_TABLET | Freq: Three times a day (TID) | ORAL | Status: DC
  Administered 2023-09-26 – 2023-09-27 (×3): 1000 mg via ORAL
  Filled 2023-09-26 (×3): qty 2

## 2023-09-26 NOTE — Progress Notes (Signed)
[facility] Progress Note Patient: [name]:[id number] DOB: 1934/11/18 DOA: 09/23/2023  DOS: the patient was seen and examined on 09/26/2023  Brief hospital course: PMH of prostate cancer SP radiation, CKD stage IV, RCC SP nephrectomy, HTN, HLD, CAD SP CABG, type II DM, chronic combined CHF, TAVR, chronic back pain. Presented with generalized weakness back pain and decreased oral intake. On 9/16 underwent L2 L4 lumbar spinal fusion with [facility] throughout Dr. Lorenso Courier. Discussed with neurosurgery, Dr Danie Binder and Dr. Lorenso Courier. Currently being treated for wound infection.  Assessment and Plan: Surgical wound infection with cellulitis. Sepsis secondary to UTI. Meeting SIRS criteria with fever, tachycardia and leukocytosis. The UA also shows evidence of positive nitrites. Currently on IV antibiotic. Follow-up on cultures.  Unfortunately no urine cultures performed. Blood cultures so far negative for 1 day. On IV cefepime as well as IV vancomycin. Wound appears to be intact for now although has surrounding cellulitis. No evidence of purulent discharge. MRI spine also negative for any epidural abscess, or myositis, or pyomyositis, or osteomyelitis or discitis. Discussed with neurosurgery here Dr. Lupita Dawn as well as patient's neurosurgeon Dr. Lorenso Courier. Recommend conservative measures with antibiotics for now and outpatient follow-up.  History of CAD, CABG, TAVR. Continue aspirin.  Hypertension. Blood pressure soft. Likely in the setting of pain as well as pain management as well as infection Monitor for now.  Chronic combined CHF. Volume status adequate. Monitor.  Chronic pain. Patient takes Norco every 4 hours scheduled at home. Changed to oxycodone to see if this will help with pain control. Also IV morphine as needed. Add scheduled Tylenol.  Acute metabolic Encephalopathy. Intermittent confusion. Initiate Seroquel. Nephew reports that the patient takes 2 tablets of  Norco every 4 hours on a regular basis at home. Although his fill history does not correlate with that and prescription only is for 1 tablet 6 times a day. Nephew also reports that the patient has reported in the past that oxycodone does not work for his pain. Due to uncontrolled pain on Norco I will change to oxycodone and monitor. Etiology not clear. Resolved. For now we will monitor.  Diarrhea. RN reports 2-3 loose BM in 24 hours. C. difficile negative.  Monitor.   Subjective: No nausea no vomiting no fever no chills.  Somewhat confused.  Physical Exam: General: in Mild distress, No Rash Cardiovascular: S1 and S2 Present, No Murmur Respiratory: Good respiratory effort, Bilateral Air entry present. No Crackles, No wheezes Abdomen: Bowel Sound present, No tenderness Extremities: No edema Neuro: Alert and oriented to self only, no new focal deficit  Data Reviewed: I have Reviewed nursing notes, Vitals, and Lab results. Since last encounter, pertinent lab results CBC and BMP   . I have ordered test including CBC and BMP  .   Disposition: Status is: Inpatient Remains inpatient appropriate because: Awaiting placement.  Ongoing confusion requiring treatment.  SCDs Start: 09/24/23 0033   Family Communication: Family at bedside Level of care: Med-Surg   Vitals:   09/25/23 1411 09/25/23 1949 09/26/23 0611 09/26/23 1220  BP: 121/60 117/76 (!) 94/55 (!) 111/57  Pulse: 99 (!) 106 95 (!) 103  Resp:  18 17 17   Temp: 98.8 F (37.1 C) 98.3 F (36.8 C) 98.2 F (36.8 C) 98.7 F (37.1 C)  TempSrc:  Oral  Oral  SpO2: 97% 100% 97% 95%  Weight:      Height:         Author: Lynden Oxford, MD 09/26/2023 7:15 PM  Please look  on [email or web address] to find out who is on call.

## 2023-09-26 NOTE — Plan of Care (Signed)

## 2023-09-26 NOTE — Plan of Care (Signed)
  Problem: Activity: Goal: Ability to return to baseline activity level will improve Outcome: Progressing   Problem: Health Behavior/Discharge Planning: Goal: Ability to safely manage health-related needs after discharge will improve Outcome: Progressing   Problem: Education: Goal: Knowledge of General Education information will improve Description: Including pain rating scale, medication(s)/side effects and non-pharmacologic comfort measures Outcome: Progressing   Problem: Clinical Measurements: Goal: Ability to maintain clinical measurements within normal limits will improve Outcome: Progressing Goal: Will remain free from infection Outcome: Progressing Goal: Diagnostic test results will improve Outcome: Progressing   Problem: Activity: Goal: Risk for activity intolerance will decrease Outcome: Progressing   Problem: Nutrition: Goal: Adequate nutrition will be maintained Outcome: Progressing   Problem: Coping: Goal: Level of anxiety will decrease Outcome: Progressing   Problem: Pain Managment: Goal: General experience of comfort will improve Outcome: Progressing   Problem: Safety: Goal: Ability to remain free from injury will improve Outcome: Progressing   Problem: Skin Integrity: Goal: Risk for impaired skin integrity will decrease Outcome: Progressing

## 2023-09-26 NOTE — Progress Notes (Signed)
Pt alert with confusion to time, place and situation. Can be oriented to reality at times but at times not. He keeps trying to get out of bed to go and take care of his sick wife but he doesn't know he is in the hospital. Wants to speak to [employer]. Able to get him on the phone. Mellody Dance re-assured him and he calmed down earlier in the shift. He has had another moment of agitation and calmed down after speaking to [place] again. He continues to endorse back pain sometimes agreeing to take pain medication and refusing at times. Will continue to monitor. Bed alarm in use and frequent checks.

## 2023-09-27 DIAGNOSIS — N390 Urinary tract infection, site not specified: Secondary | ICD-10-CM | POA: Diagnosis not present

## 2023-09-27 DIAGNOSIS — A419 Sepsis, unspecified organism: Secondary | ICD-10-CM | POA: Diagnosis not present

## 2023-09-27 LAB — MAGNESIUM: Magnesium: 1.8 mg/dL (ref 1.7–2.4)

## 2023-09-27 LAB — CBC
HCT: 31.1 % — ABNORMAL LOW (ref 39.0–52.0)
Hemoglobin: 9.9 g/dL — ABNORMAL LOW (ref 13.0–17.0)
MCH: 31.5 pg (ref 26.0–34.0)
MCHC: 31.8 g/dL (ref 30.0–36.0)
MCV: 99 fL (ref 80.0–100.0)
Platelets: 375 10*3/uL (ref 150–400)
RBC: 3.14 MIL/uL — ABNORMAL LOW (ref 4.22–5.81)
RDW: 13.5 % (ref 11.5–15.5)
WBC: 9.5 10*3/uL (ref 4.0–10.5)
nRBC: 0 % (ref 0.0–0.2)

## 2023-09-27 LAB — BASIC METABOLIC PANEL
Anion gap: 11 (ref 5–15)
BUN: 20 mg/dL (ref 8–23)
CO2: 25 mmol/L (ref 22–32)
Calcium: 9.7 mg/dL (ref 8.9–10.3)
Chloride: 99 mmol/L (ref 98–111)
Creatinine, Ser: 1.31 mg/dL — ABNORMAL HIGH (ref 0.61–1.24)
GFR, Estimated: 53 mL/min — ABNORMAL LOW (ref >60)
Glucose, Bld: 194 mg/dL — ABNORMAL HIGH (ref 70–99)
Potassium: 4.2 mmol/L (ref 3.5–5.1)
Sodium: 135 mmol/L (ref 135–145)

## 2023-09-27 LAB — C-REACTIVE PROTEIN: CRP: 17.5 mg/dL — ABNORMAL HIGH (ref <1.0)

## 2023-09-27 MED ORDER — MIDODRINE HCL 5 MG PO TABS
5.0000 mg | ORAL_TABLET | Freq: Three times a day (TID) | ORAL | Status: DC
  Administered 2023-09-27 – 2023-09-29 (×6): 5 mg via ORAL
  Filled 2023-09-27 (×6): qty 1

## 2023-09-27 MED ORDER — HYDROCODONE-ACETAMINOPHEN 10-325 MG PO TABS
1.0000 | ORAL_TABLET | ORAL | Status: DC | PRN
  Administered 2023-09-29: 1 via ORAL
  Filled 2023-09-27: qty 1

## 2023-09-27 MED ORDER — TRAZODONE HCL 50 MG PO TABS
25.0000 mg | ORAL_TABLET | Freq: Every day | ORAL | Status: DC
  Administered 2023-09-27 – 2023-09-30 (×3): 25 mg via ORAL
  Filled 2023-09-27 (×3): qty 1

## 2023-09-27 MED ORDER — SODIUM CHLORIDE 0.9 % IV BOLUS
500.0000 mL | Freq: Once | INTRAVENOUS | Status: AC
  Administered 2023-09-27: 500 mL via INTRAVENOUS

## 2023-09-27 MED ORDER — QUETIAPINE FUMARATE 25 MG PO TABS
25.0000 mg | ORAL_TABLET | Freq: Every evening | ORAL | Status: DC
  Administered 2023-09-27 – 2023-09-29 (×3): 25 mg via ORAL
  Filled 2023-09-27 (×3): qty 1

## 2023-09-27 NOTE — Progress Notes (Signed)
PIV consult: no current IV meds ordered. For vessel preservation, VAST will insert new IV when/ if IV medication is ordered as pt removed last PIV.

## 2023-09-27 NOTE — Progress Notes (Signed)
Occupational Therapy Treatment Patient Details Name: [name] MRN: [id number] DOB: 14-Apr-1934 Today's Date: 09/27/2023   History of present illness 89 y.o. male with degenerative scoliosis and was admitted for elective lumbar stabilization with L2-4 lumbar spinal fusion on 9/16 at [facility]. He refused rehab at discharge and since being home he has not be able to eat or drink much for at least 10 days. Has continuous back pain. No abdominal pain. No nausea, vomiting or diarrhea. He finally decided to start home PT and today PT came and prompted him to present to ED.  Past medical history significant of hx of prostate cancer s/p radiation, CKD stage IV secondary to RCC s/p partial L nephrectomy, HTN, HLD, CAD s/p CABG (2007), T2DM, chronic systolic and diastolic heart failure, and TAVR 06/2018   OT comments  Pt tearful at times over his physical decline and inability to care for his wife at home. Tolerated up in chair x 30 minutes while engaged in ADLs. Asking to return to supine at end of session due to back pain. Reinforced back precautions with bed mobility. Pt requiring light min assist for OOB with RW. Completed grooming in sitting with set up. Patient will benefit from continued inpatient follow up therapy, <3 hours/day.       If plan is discharge home, recommend the following:  A little help with walking and/or transfers;A lot of help with bathing/dressing/bathroom;Assistance with cooking/housework;Help with stairs or ramp for entrance;Assist for transportation   Equipment Recommendations  Other (comment) (defer to next venue)    Recommendations for Other Services      Precautions / Restrictions Precautions Precautions: Back;Fall Precaution Booklet Issued: No Precaution Comments: educated in log roll technique Restrictions Weight Bearing Restrictions: No       Mobility Bed Mobility Overal bed mobility: Needs Assistance Bed Mobility: Rolling, Sidelying to Sit,  Sit to Sidelying Rolling: Contact guard assist Sidelying to sit: Contact guard assist     Sit to sidelying: Mod assist General bed mobility comments: assisted for LEs back into bed, cues for log roll    Transfers Overall transfer level: Needs assistance Equipment used: Rolling walker (2 wheels) Transfers: Sit to/from Stand Sit to Stand: Min assist           General transfer comment: light min assist, cues for hand placement     Balance Overall balance assessment: Needs assistance   Sitting balance-Leahy Scale: Fair       Standing balance-Leahy Scale: Poor                             ADL either performed or assessed with clinical judgement   ADL Overall ADL's : Needs assistance/impaired     Grooming: Wash/dry hands;Wash/dry face;Oral care;Brushing hair;Sitting;Set up           Upper Body Dressing : Minimal assistance;Sitting       Toilet Transfer: Minimal assistance;Ambulation;Rolling walker (2 wheels)           Functional mobility during ADLs: Minimal assistance;Rolling walker (2 wheels)      Extremity/Trunk Assessment              Vision       Perception     Praxis      Cognition Arousal: Alert Behavior During Therapy: WFL for tasks assessed/performed Overall Cognitive Status: Impaired/Different from baseline Area of Impairment: Memory  Memory: Decreased recall of precautions, Decreased short-term memory         General Comments: pt is aware of his memory deficits, tearful at times        Exercises      Shoulder Instructions       General Comments      Pertinent Vitals/ Pain       Pain Assessment Pain Assessment: Faces Faces Pain Scale: Hurts even more Pain Location: L side of back Pain Descriptors / Indicators: Sore, Discomfort Pain Intervention(s): Monitored during session, Repositioned  Home Living                                          Prior  Functioning/Environment              Frequency  Min 1X/week        Progress Toward Goals  OT Goals(current goals can now be found in the care plan section)  Progress towards OT goals: Progressing toward goals  Acute Rehab OT Goals OT Goal Formulation: Patient unable to participate in goal setting Time For Goal Achievement: 10/08/23 Potential to Achieve Goals: Good  Plan      Co-evaluation                 AM-PAC OT "6 Clicks" Daily Activity     Outcome Measure   Help from another person eating meals?: None Help from another person taking care of personal grooming?: A Little Help from another person toileting, which includes using toliet, bedpan, or urinal?: Total Help from another person bathing (including washing, rinsing, drying)?: A Lot Help from another person to put on and taking off regular upper body clothing?: A Little Help from another person to put on and taking off regular lower body clothing?: A Lot 6 Click Score: 15    End of Session Equipment Utilized During Treatment: Gait belt;Rolling walker (2 wheels)  OT Visit Diagnosis: Unsteadiness on feet (R26.81);Muscle weakness (generalized) (M62.81);Pain   Activity Tolerance Patient tolerated treatment well   Patient Left in bed;with call bell/phone within reach;with bed alarm set   Nurse Communication          Time: 1610-9604 OT Time Calculation (min): 52 min  Charges: OT General Charges $OT Visit: 1 Visit OT Treatments $Self Care/Home Management : 38-52 mins  Berna Spare, OTR/L Acute Rehabilitation Services Office: [phone]   Evern Bio 09/27/2023, 3:17 PM

## 2023-09-27 NOTE — Progress Notes (Signed)
Physical Therapy Treatment Patient Details Name: [name] MRN: [id number] DOB: 07-Mar-1934 Today's Date: 09/27/2023   History of Present Illness 89 y.o. male with degenerative scoliosis and was admitted for elective lumbar stabilization with L2-4 lumbar spinal fusion on 9/16 at [facility]. He refused rehab at discharge and since being home he has not be able to eat or drink much for at least 10 days. Has continuous back pain. No abdominal pain. No nausea, vomiting or diarrhea. He finally decided to start home PT and today PT came and prompted him to present to ED.  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 Pt amb 100 ft with recliner follow for safety, equal bil step length, slow cadence, trunk kyphotic, limited by pain and fatigue. Pt remains upon recliner with MD entering room at EOS; RN notified of pain complaints.   If plan is discharge home, recommend the following: A little help with walking and/or transfers;A little help with bathing/dressing/bathroom;Assistance with cooking/housework;Assist for transportation;Help with stairs or ramp for entrance   Can travel by private vehicle     No  Equipment Recommendations  None recommended by PT    Recommendations for Other Services       Precautions / Restrictions Precautions Precautions: Back;Fall Precaution Booklet Issued: No Precaution Comments: reviewed while mobilizing Restrictions Weight Bearing Restrictions: No     Mobility  Bed Mobility Overal bed mobility: Needs Assistance   Rolling: Contact guard assist Sidelying to sit: Min assist, Used rails, HOB elevated       General bed mobility comments: educated pt on log rolling, verbal  cues for sequencing and technique, able to come to sidelying with CGA, min A to power up into sitting and scoot out to EOB with use of bedpad    Transfers Overall transfer level: Needs assistance Equipment used: Rolling walker (2 wheels) Transfers: Sit to/from Stand Sit to Stand: Min assist           General transfer comment: min A to power up to stand, verbal cues for LE engagement and hand placement    Ambulation/Gait Ambulation/Gait assistance: Contact guard assist, +2 safety/equipment Gait Distance (Feet): 100 Feet Assistive device: Rolling walker (2 wheels) Gait Pattern/deviations: Step-to pattern, Decreased stride length, Trunk flexed Gait velocity: decreased     General Gait Details: slow, short steps with kyphotic posture, recliner follow for safety, good bil foot clearance with equal bil step length, no overt LOB, limited by fatigue and pain   Stairs             Wheelchair Mobility     Tilt Bed    Modified Rankin (Stroke Patients Only)       Balance Overall balance assessment: Needs assistance Sitting-balance support: Feet supported Sitting balance-Leahy Scale: Fair     Standing balance support: Bilateral upper extremity supported, During functional activity, Reliant on assistive device for balance Standing balance-Leahy Scale: Poor                              Cognition Arousal: Alert Behavior During Therapy: WFL for tasks assessed/performed Overall Cognitive Status: No family/caregiver present to determine baseline cognitive functioning  Memory: Decreased recall of precautions (recall and verbalize 1/3, needs cues to maintain precautions wtih mobility) Following Commands: Follows one step commands with increased time       General Comments: pt states 1/3 back precautions, aware of being in hospital (unsure of hospital name), follows 1 step commands consistently with cues and time         Exercises      General Comments        Pertinent Vitals/Pain Pain Assessment Pain Assessment: 0-10 Pain Score: 8  Pain Location: back Pain Descriptors / Indicators: Sore, Discomfort Pain Intervention(s): Limited activity within patient's tolerance, Monitored during session, Repositioned, Patient requesting pain meds-RN notified    Home Living                          Prior Function            PT Goals (current goals can now be found in the care plan section) Acute Rehab PT Goals PT Goal Formulation: With patient Time For Goal Achievement: 10/08/23 Potential to Achieve Goals: Good Progress towards PT goals: Progressing toward goals    Frequency    Min 1X/week      PT Plan      Co-evaluation              AM-PAC PT "6 Clicks" Mobility   Outcome Measure  Help needed turning from your back to your side while in a flat bed without using bedrails?: A Little Help needed moving from lying on your back to sitting on the side of a flat bed without using bedrails?: A Little Help needed moving to and from a bed to a chair (including a wheelchair)?: A Little Help needed standing up from a chair using your arms (e.g., wheelchair or bedside chair)?: A Little Help needed to walk in hospital room?: A Little Help needed climbing 3-5 steps with a railing? : A Lot 6 Click Score: 17    End of Session Equipment Utilized During Treatment: Gait belt Activity Tolerance: Patient tolerated treatment well;Patient limited by pain Patient left: in chair;with call bell/phone within reach;with chair alarm set;Other (comment) (MD enterring room) Nurse Communication: Mobility status;Patient requests pain meds PT Visit Diagnosis: Difficulty in walking, not elsewhere classified (R26.2);Pain Pain - part of body:  (back)     Time: 0951-1010 PT Time Calculation (min) (ACUTE ONLY): 19 min  Charges:    $Gait Training: 8-22 mins PT General Charges $$ ACUTE PT VISIT: 1  Visit                     [name] PT, DPT 09/27/23, 12:47 PM

## 2023-09-27 NOTE — Progress Notes (Signed)
[facility] Progress Note Patient: [name] ZOX:[id number] DOB: 02/16/1934 DOA: 09/23/2023  DOS: the patient was seen and examined on 09/27/2023  Brief hospital course: PMH of prostate cancer SP radiation, CKD stage IV, RCC SP nephrectomy, HTN, HLD, CAD SP CABG, type II DM, chronic combined CHF, TAVR, chronic back pain. Presented with generalized weakness back pain and decreased oral intake. On 9/16 underwent L2 L4 lumbar spinal fusion with [facility] throughout Dr. Lorenso Courier. Discussed with neurosurgery, Dr Danie Binder and Dr. Lorenso Courier. Currently being treated for wound infection.  Assessment and Plan: Surgical wound infection with cellulitis. Sepsis secondary to UTI. Meeting SIRS criteria with fever, tachycardia and leukocytosis. The UA also shows evidence of positive nitrites. Currently on IV antibiotic. Follow-up on cultures.  Unfortunately no urine cultures performed. Blood cultures so far negative Was on IV cefepime as well as IV vancomycin. Wound appears to be intact for now although has surrounding cellulitis. No evidence of purulent discharge. MRI spine also negative for any epidural abscess, or myositis, or pyomyositis, or osteomyelitis or discitis. Discussed with neurosurgery here Dr. Lupita Dawn as well as patient's neurosurgeon Dr. Lorenso Courier. Recommend conservative measures with antibiotics for now and outpatient follow-up. CRP improving.  History of CAD, CABG, TAVR. Continue aspirin.  Hypertension. Blood pressure soft. Likely in the setting of pain as well as pain management as well as infection Given IV fluid.  Now on midodrine.  Monitor for now.  Chronic combined CHF. Volume status adequate. Monitor.  Chronic pain. Patient takes Norco 10 every 4 hours scheduled at home. Resume home dose.  Acute metabolic Encephalopathy. Intermittent confusion. Concern for withdrawal symptoms in the setting of chronic opioid use. Initiate Seroquel.  Diarrhea. RN reports 2-3 loose  BM in 24 hours. C. difficile negative.  Monitor.   Subjective: No nausea no vomiting.  No acute complaint.  More alert.  Physical Exam: General: in Mild distress, No Rash Cardiovascular: S1 and S2 Present, No Murmur Respiratory: Good respiratory effort, Bilateral Air entry present. No Crackles, No wheezes Abdomen: Bowel Sound present, No tenderness Extremities: No edema Neuro: Alert and oriented x3, no new focal deficit  Data Reviewed: I have Reviewed nursing notes, Vitals, and Lab results. Since last encounter, pertinent lab results CBC and BMP   . I have ordered test including CBC and BMP  .  Disposition: Status is: Inpatient Remains inpatient appropriate because: Hypotensive.  Requires blood pressure correction.  SCDs Start: 09/24/23 0033   Family Communication: No one at bedside.  Discussed with nephew on 10/6. Level of care: Med-Surg   Vitals:   09/27/23 1154 09/27/23 1227 09/27/23 1329 09/27/23 1840  BP: (!) 171/152 (!) 86/48 (!) 97/54 125/60  Pulse: (!) 107 96 96 (!) 106  Resp: 17     Temp: 98.2 F (36.8 C)     TempSrc: Oral     SpO2: 98% 96%    Weight:      Height:         Author: Lynden Oxford, MD 09/27/2023 7:41 PM  Please look on [email or web address] to find out who is on call.

## 2023-09-27 NOTE — Plan of Care (Signed)

## 2023-09-27 NOTE — TOC Progression Note (Addendum)
Transition of Care [facility]) - Progression Note    Patient Details  Name: [name] MRN: [id number] Date of Birth: December 19, 1934  [facility] CM/SW Contact  [name], LCSW Phone Number: 09/27/2023, 1:16 PM  Clinical Narrative:    Spoke with pt's nephew who has accepted SNF bed offer at [facility]. VM left with [facility] to begin insurance auth.  Pt able to transfer to SNF pending insurance approval, and no restraints or PRN medications for agitation for 24 hours.   ADDENDUM: Insurance [employer] has been requested and currently pending approval.    Expected Discharge Plan: Home w Home Health Services Barriers to Discharge: No Barriers Identified  Expected Discharge Plan and Services In-house Referral: NA Discharge Planning Services: NA   Living arrangements for the past 2 months: Single Family Home                                       Social Determinants of Health (SDOH) Interventions SDOH Screenings   Food Insecurity: No Food Insecurity (09/24/2023)  Housing: High Risk (09/24/2023)  Transportation Needs: No Transportation Needs (09/24/2023)  Utilities: Not At Risk (09/24/2023)  Social Connections: Unknown (05/05/2022)   Received from [facility], [facility]  Stress: No Stress Concern Present (09/06/2023)   Received from [facility]  Tobacco Use: Medium Risk (09/23/2023)    Readmission Risk Interventions     No data to display

## 2023-09-28 ENCOUNTER — Inpatient Hospital Stay (HOSPITAL_COMMUNITY): Payer: PPO

## 2023-09-28 DIAGNOSIS — A419 Sepsis, unspecified organism: Secondary | ICD-10-CM | POA: Diagnosis not present

## 2023-09-28 DIAGNOSIS — N390 Urinary tract infection, site not specified: Secondary | ICD-10-CM | POA: Diagnosis not present

## 2023-09-28 LAB — BASIC METABOLIC PANEL
Anion gap: 13 (ref 5–15)
BUN: 26 mg/dL — ABNORMAL HIGH (ref 8–23)
CO2: 25 mmol/L (ref 22–32)
Calcium: 10.1 mg/dL (ref 8.9–10.3)
Chloride: 99 mmol/L (ref 98–111)
Creatinine, Ser: 1.25 mg/dL — ABNORMAL HIGH (ref 0.61–1.24)
GFR, Estimated: 56 mL/min — ABNORMAL LOW (ref >60)
Glucose, Bld: 190 mg/dL — ABNORMAL HIGH (ref 70–99)
Potassium: 4.2 mmol/L (ref 3.5–5.1)
Sodium: 137 mmol/L (ref 135–145)

## 2023-09-28 LAB — MAGNESIUM: Magnesium: 1.6 mg/dL — ABNORMAL LOW (ref 1.7–2.4)

## 2023-09-28 LAB — CBC
HCT: 30.5 % — ABNORMAL LOW (ref 39.0–52.0)
Hemoglobin: 9.8 g/dL — ABNORMAL LOW (ref 13.0–17.0)
MCH: 31.5 pg (ref 26.0–34.0)
MCHC: 32.1 g/dL (ref 30.0–36.0)
MCV: 98.1 fL (ref 80.0–100.0)
Platelets: 337 10*3/uL (ref 150–400)
RBC: 3.11 MIL/uL — ABNORMAL LOW (ref 4.22–5.81)
RDW: 13.7 % (ref 11.5–15.5)
WBC: 10.3 10*3/uL (ref 4.0–10.5)
nRBC: 0 % (ref 0.0–0.2)

## 2023-09-28 MED ORDER — SODIUM CHLORIDE 0.9 % IV SOLN: INTRAVENOUS | Status: AC

## 2023-09-28 MED ORDER — MAGNESIUM SULFATE 2 GM/50ML IV SOLN
2.0000 g | Freq: Once | INTRAVENOUS | Status: AC
  Administered 2023-09-28: 2 g via INTRAVENOUS
  Filled 2023-09-28: qty 50

## 2023-09-28 MED ORDER — LACTULOSE 10 GM/15ML PO SOLN
20.0000 g | Freq: Every day | ORAL | Status: DC
  Administered 2023-09-28 – 2023-09-29 (×2): 20 g via ORAL
  Filled 2023-09-28 (×3): qty 30

## 2023-09-28 MED ORDER — SODIUM CHLORIDE 0.9 % IV SOLN: INTRAVENOUS | Status: DC

## 2023-09-28 MED ORDER — BISACODYL 10 MG RE SUPP
10.0000 mg | Freq: Every day | RECTAL | Status: DC
  Administered 2023-09-28 – 2023-09-30 (×2): 10 mg via RECTAL
  Filled 2023-09-28 (×2): qty 1

## 2023-09-28 NOTE — TOC Progression Note (Signed)
Transition of Care [facility]) - Progression Note    Patient Details  Name: [name] MRN: [id number] Date of Birth: October 29, 1934  [facility]) CM/SW Contact  [name], LCSW Phone Number: 09/28/2023, 11:35 AM  Clinical Narrative:    Pt's insurance authorization has been approved. SNF Auth: [id number]. Auth for PTAR: [id number].   Expected Discharge Plan: Home w Home Health Services Barriers to Discharge: No Barriers Identified  Expected Discharge Plan and Services In-house Referral: NA Discharge Planning Services: NA   Living arrangements for the past 2 months: Single Family Home                                       Social Determinants of Health (SDOH) Interventions SDOH Screenings   Food Insecurity: No Food Insecurity (09/24/2023)  Housing: High Risk (09/24/2023)  Transportation Needs: No Transportation Needs (09/24/2023)  Utilities: Not At Risk (09/24/2023)  Social Connections: Unknown (05/05/2022)   Received from [facility], [facility]  Stress: No Stress Concern Present (09/06/2023)   Received from [facility]  Tobacco Use: Medium Risk (09/23/2023)    Readmission Risk Interventions     No data to display

## 2023-09-28 NOTE — Plan of Care (Signed)
Plan of Care reviewed. 

## 2023-09-28 NOTE — Progress Notes (Signed)
[facility] Progress Note Patient: [name] UJW:[id number] DOB: Oct 30, 1934 DOA: 09/23/2023  DOS: the patient was seen and examined on 09/28/2023  Brief hospital course: PMH of prostate cancer SP radiation, CKD stage IV, RCC SP nephrectomy, HTN, HLD, CAD SP CABG, type II DM, chronic combined CHF, TAVR, chronic back pain. Presented with generalized weakness back pain and decreased oral intake. On 9/16 underwent L2 L4 lumbar spinal fusion with [facility] throughout Dr. Lorenso Courier. Discussed with neurosurgery, Dr Danie Binder and Dr. Lorenso Courier. Currently being treated for wound infection.  Assessment and Plan: Surgical wound infection with cellulitis. Sepsis secondary to UTI. Meeting SIRS criteria with fever, tachycardia and leukocytosis. The UA also shows evidence of positive nitrites. Currently on IV antibiotic. Follow-up on cultures.  Unfortunately no urine cultures performed. Blood cultures so far negative Was on IV cefepime as well as IV vancomycin. Wound appears to be intact for now although has surrounding cellulitis. No evidence of purulent discharge. MRI spine also negative for any epidural abscess, or myositis, or pyomyositis, or osteomyelitis or discitis. Discussed with neurosurgery here Dr. Lupita Dawn as well as patient's neurosurgeon Dr. Lorenso Courier. Recommend conservative measures with antibiotics for now and outpatient follow-up. CRP improving.  History of CAD, CABG, TAVR. Continue aspirin.  Hypertension. Hypotension. Blood pressure soft. Likely in the setting of pain as well as pain management as well as infection Given IV fluid.  Now on midodrine.  Monitor for now.  Chronic combined CHF. Volume status adequate. Monitor.  Chronic pain. Patient takes Norco 10 every 4 hours scheduled at home. Resume home dose.  Acute metabolic Encephalopathy. Intermittent confusion. Concern for withdrawal symptoms in the setting of chronic opioid use. Initiate Seroquel. CT of the head  unremarkable.  Diarrhea. RN reports 2-3 loose BM in 24 hours. C. difficile negative.  Monitor.  Abdominal pain. CT abdomen performed. Some evidence of constipation as well as dilated bowels. I am unable to decipher further. Will provide [place] to suppository.   Subjective: More confused today.  Speech not clear secondary to dry mouth.  Minimal oral intake.  Pain in the abdomen pleasant on exam.  Physical Exam: General: in Mild distress, No Rash Cardiovascular: S1 and S2 Present, No Murmur Respiratory: Good respiratory effort, Bilateral Air entry present. No Crackles, No wheezes Abdomen: Bowel Sound present, mild diffuse tenderness Extremities: No edema Neuro: Alert and oriented x3, no new focal deficit  Data Reviewed: I have Reviewed nursing notes, Vitals, and Lab results. Since last encounter, pertinent lab results CBC and CMP   . I have ordered test including CBC and CMP  .   Disposition: Status is: Inpatient Remains inpatient appropriate because: Need for improvement in oral intake and mentation  SCDs Start: 09/24/23 0033   Family Communication: Family at bedside Level of care: Med-Surg   Vitals:   09/27/23 1329 09/27/23 1840 09/27/23 2021 09/28/23 0446  BP: (!) 97/54 125/60 118/72 115/60  Pulse: 96 (!) 106 100 (!) 107  Resp:   18 19  Temp:   98.8 F (37.1 C) (!) 97.5 F (36.4 C)  TempSrc:   Oral Oral  SpO2:   98% 97%  Weight:      Height:         Author: Lynden Oxford, MD 09/28/2023 7:49 PM  Please look on [email or web address] to find out who is on call.

## 2023-09-29 DIAGNOSIS — N390 Urinary tract infection, site not specified: Secondary | ICD-10-CM | POA: Diagnosis not present

## 2023-09-29 DIAGNOSIS — A419 Sepsis, unspecified organism: Secondary | ICD-10-CM | POA: Diagnosis not present

## 2023-09-29 LAB — CBC WITH DIFFERENTIAL/PLATELET
Abs Immature Granulocytes: 0.04 10*3/uL (ref 0.00–0.07)
Basophils Absolute: 0.1 10*3/uL (ref 0.0–0.1)
Basophils Relative: 1 %
Eosinophils Absolute: 0.2 10*3/uL (ref 0.0–0.5)
Eosinophils Relative: 3 %
HCT: 29.6 % — ABNORMAL LOW (ref 39.0–52.0)
Hemoglobin: 9.4 g/dL — ABNORMAL LOW (ref 13.0–17.0)
Immature Granulocytes: 0 %
Lymphocytes Relative: 19 %
Lymphs Abs: 1.9 10*3/uL (ref 0.7–4.0)
MCH: 31.5 pg (ref 26.0–34.0)
MCHC: 31.8 g/dL (ref 30.0–36.0)
MCV: 99.3 fL (ref 80.0–100.0)
Monocytes Absolute: 1.1 10*3/uL — ABNORMAL HIGH (ref 0.1–1.0)
Monocytes Relative: 11 %
Neutro Abs: 6.5 10*3/uL (ref 1.7–7.7)
Neutrophils Relative %: 66 %
Platelets: 318 10*3/uL (ref 150–400)
RBC: 2.98 MIL/uL — ABNORMAL LOW (ref 4.22–5.81)
RDW: 13.7 % (ref 11.5–15.5)
WBC: 9.7 10*3/uL (ref 4.0–10.5)
nRBC: 0 % (ref 0.0–0.2)

## 2023-09-29 LAB — COMPREHENSIVE METABOLIC PANEL
ALT: 17 U/L (ref 0–44)
AST: 26 U/L (ref 15–41)
Albumin: 2.3 g/dL — ABNORMAL LOW (ref 3.5–5.0)
Alkaline Phosphatase: 64 U/L (ref 38–126)
Anion gap: 9 (ref 5–15)
BUN: 27 mg/dL — ABNORMAL HIGH (ref 8–23)
CO2: 23 mmol/L (ref 22–32)
Calcium: 9.2 mg/dL (ref 8.9–10.3)
Chloride: 103 mmol/L (ref 98–111)
Creatinine, Ser: 1.28 mg/dL — ABNORMAL HIGH (ref 0.61–1.24)
GFR, Estimated: 54 mL/min — ABNORMAL LOW (ref >60)
Glucose, Bld: 173 mg/dL — ABNORMAL HIGH (ref 70–99)
Potassium: 3.9 mmol/L (ref 3.5–5.1)
Sodium: 135 mmol/L (ref 135–145)
Total Bilirubin: 0.7 mg/dL (ref 0.3–1.2)
Total Protein: 6.5 g/dL (ref 6.5–8.1)

## 2023-09-29 LAB — CULTURE, BLOOD (ROUTINE X 2)
Culture: NO GROWTH
Culture: NO GROWTH
Special Requests: ADEQUATE
Special Requests: ADEQUATE

## 2023-09-29 LAB — T4, FREE: Free T4: 1.38 ng/dL — ABNORMAL HIGH (ref 0.61–1.12)

## 2023-09-29 LAB — MAGNESIUM: Magnesium: 1.9 mg/dL (ref 1.7–2.4)

## 2023-09-29 LAB — TSH: TSH: 0.765 u[IU]/mL (ref 0.350–4.500)

## 2023-09-29 LAB — AMMONIA: Ammonia: 11 umol/L (ref 9–35)

## 2023-09-29 LAB — VITAMIN B12: Vitamin B-12: 449 pg/mL (ref 180–914)

## 2023-09-29 NOTE — Progress Notes (Signed)
PROGRESS NOTE    [name]  GNF:[id number] DOB: 1934-10-20 DOA: 09/23/2023 PCP: Emilio Aspen, MD   Brief Narrative: [name] is a 89 y.o. male with a history of prostate cancer status post radiation, CKD stage IV, renal cell carcinoma status post nephrectomy, hypertension, hyperlipidemia, CAD status post CABG, diabetes mellitus type 2, chronic combined heart failure, TAVR, chronic back pain.  Patient presented secondary to generalized weakness, back pain and decreased oral intake and was found to have evidence of surgical wound infection with cellulitis in addition to sepsis secondary to UTI.  Patient started empirically on antibiotics with improvement of symptoms.   Assessment and Plan:  Sepsis secondary to UTI Present on admission. Urinalysis suggestive of UTI. Empiric Vancomycin/Cefepime started for treatment. No urine culture obtained. Blood cultures obtained and are no growth. Patient to complete 7 days of antibiotics today.  Surgical wound infection Cellulitis Patient started on empiric treatment with Vancomycin/Cefepime and transitioned to Doxycycline/Ciprofloxacin -Continue Ciprofloxacin and doxycycline with plan to complete a 10 day course of antibiotics  CAD History of CABG and TAVR. Asymptomatic. Patient is on aspirin and metoprolol as an outpatient. Metoprolol held secondary to hypotension. -Continue aspirin  Primary hypertension Patient is on metoprolol as an outpatient which is held secondary to hypotension.  Hypotension Patient started on midodrine. Presumed secondary to pain management and infection, however it appears patient has a chronic history of hypotension -Discontinue midodrine  Chronic pain -Continue Norco PRN  Chronic combined heart failure Stable. Patient is on metoprolol and torsemide as an outpatient which are held secondary to hypotension.  Acute metabolic encephalopathy Appears to be resolved. CT head  unremarkable.  Diarrhea Improved.  Abdominal pain Resolved.   DVT prophylaxis: SCDs Start: 09/24/23 0033  Code Status:   Code Status: Full Code Family Communication: None at bedside Disposition Plan: Discharge to SNF likely in 1-2 days pending stable blood pressure off of midodrine   Consultants:  None  Procedures:    Antimicrobials:     Subjective: Patient reports some back pain, otherwise no concerns.  Objective: BP (!) 102/55 (BP Location: Right Arm)   Pulse 97   Temp 98.3 F (36.8 C) (Oral)   Resp 18   Ht 5' 6.5" (1.689 m)   Wt 74.8 kg   SpO2 98%   BMI 26.23 kg/m   Examination:  General exam: Appears calm and comfortable Respiratory system: Clear to auscultation. Respiratory effort normal. Cardiovascular system: S1 & S2 heard, RRR. Gastrointestinal system: Abdomen is nondistended, soft and nontender.  Normal bowel sounds heard. Central nervous system: Alert and oriented. No focal neurological deficits. Musculoskeletal: No edema. No calf tenderness Skin: Back with foam dressing applied. Psychiatry: Judgement and insight appear normal. Mood & affect appropriate.    Data Reviewed: I have personally reviewed following labs and imaging studies  CBC Lab Results  Component Value Date   WBC 9.7 09/29/2023   RBC 2.98 (L) 09/29/2023   HGB 9.4 (L) 09/29/2023   HCT 29.6 (L) 09/29/2023   MCV 99.3 09/29/2023   MCH 31.5 09/29/2023   PLT 318 09/29/2023   MCHC 31.8 09/29/2023   RDW 13.7 09/29/2023   LYMPHSABS 1.9 09/29/2023   MONOABS 1.1 (H) 09/29/2023   EOSABS 0.2 09/29/2023   BASOSABS 0.1 09/29/2023     Last metabolic panel Lab Results  Component Value Date   NA 135 09/29/2023   K 3.9 09/29/2023   CL 103 09/29/2023   CO2 23 09/29/2023   BUN 27 (H) 09/29/2023  CREATININE 1.28 (H) 09/29/2023   GLUCOSE 173 (H) 09/29/2023   GFRNONAA 54 (L) 09/29/2023   GFRAA 40 (L) 11/26/2020   CALCIUM 9.2 09/29/2023   PROT 6.5 09/29/2023   ALBUMIN 2.3 (L)  09/29/2023   LABGLOB 2.7 02/12/2020   BILITOT 0.7 09/29/2023   ALKPHOS 64 09/29/2023   AST 26 09/29/2023   ALT 17 09/29/2023   ANIONGAP 9 09/29/2023    GFR: Estimated Creatinine Clearance: 37.4 mL/min (A) (by C-G formula based on SCr of 1.28 mg/dL (H)).  Recent Results (from the past 240 hour(s))  Blood culture (routine x 2)     Status: None   Collection Time: 09/23/23 10:32 PM   Specimen: BLOOD  Result Value Ref Range Status   Specimen Description   Final    BLOOD RIGHT ANTECUBITAL Performed at [facility], [address], [place], [place] [address]    Special Requests   Final    BOTTLES DRAWN AEROBIC AND ANAEROBIC Blood Culture adequate volume Performed at [facility], [address] [place], [place] [address]    Culture   Final    NO GROWTH 5 DAYS Performed at [facility], [address], [place], [place] [address]    Report Status 09/29/2023 FINAL  Final  Blood culture (routine x 2)     Status: None   Collection Time: 09/23/23 10:39 PM   Specimen: BLOOD RIGHT FOREARM  Result Value Ref Range Status   Specimen Description   Final    BLOOD RIGHT FOREARM Performed at [facility], [address], [place], [place] [address]    Special Requests   Final    BOTTLES DRAWN AEROBIC AND ANAEROBIC Blood Culture adequate volume Performed at [facility], [address], [place], [place] [address]    Culture   Final    NO GROWTH 5 DAYS Performed at [facility], [address], [place], [place] [address]    Report Status 09/29/2023 FINAL  Final      Radiology Studies: CT ABDOMEN PELVIS WO CONTRAST  Result Date: 09/28/2023 CLINICAL DATA:  Diffuse abdominal pain EXAM: CT ABDOMEN AND PELVIS WITHOUT CONTRAST TECHNIQUE: Multidetector CT imaging of the abdomen and pelvis was performed following the standard protocol without IV contrast. RADIATION DOSE REDUCTION: This exam was performed according to the  departmental dose-optimization program which includes automated exposure control, adjustment of the mA and/or kV according to patient size and/or use of iterative reconstruction technique. COMPARISON:  CT 06/03/2017, 11/16/2018 FINDINGS: Lower chest: Lung bases demonstrate no acute airspace disease. Mild reticular disease in the right lung base. Partially visualized aortic valve prosthesis. Coronary vascular calcification. Hepatobiliary: No focal liver abnormality is seen. No gallstones, gallbladder wall thickening, or biliary dilatation. Pancreas: Unremarkable. No pancreatic ductal dilatation or surrounding inflammatory changes. Spleen: Normal in size without focal abnormality. Adrenals/Urinary Tract: Adrenal glands are within normal limits. Kidneys show no hydronephrosis. Multiple small right kidney stones measuring up to 3 mm. Cortical scarring left kidney with partial nephrectomy changes. Rim calcified area with internal fat density at the mid pole likely due to postoperative change. The bladder is unremarkable Stomach/Bowel: The stomach is nonenlarged. No dilated small bowel. No acute bowel wall thickening. Diverticular disease of the left colon. Vascular/Lymphatic: Moderate aortic atherosclerosis. No aneurysm. No suspicious lymph nodes Reproductive: Multiple prostate seeds Other: Negative for pelvic effusion or free air. Small fat containing supraumbilical ventral hernias. Musculoskeletal: Posterior spinal hardware L1 through L4. Fusion of the  L4 on L5 vertebra. No acute osseous abnormality. Thin gas and fluid collection within the subcutaneous soft tissues superficial to the paraspinal muscles at the L2-L3 level, extending inferiorly to the level of L5. IMPRESSION: 1. Negative for hydronephrosis. Multiple small nonobstructing right kidney stones. 2. Postoperative changes of the left kidney. 3. Diverticular disease of the left colon without acute inflammatory process. 4. Small fat containing supraumbilical  ventral hernias. 5. Posterior spinal hardware L1 through L4. Thin gas and fluid collection within the subcutaneous soft tissues superficial to the paraspinal muscles at the L2-L3 level, extending inferiorly to the level of L5. This could be due to resolving postoperative change, correlate with surgical history and direct inspection for any clinical signs of infection. 6. Aortic atherosclerosis. Aortic Atherosclerosis (ICD10-I70.0). Electronically Signed   By: Jasmine Pang M.D.   On: 09/28/2023 20:16   CT HEAD WO CONTRAST ( )  Result Date: 09/28/2023 CLINICAL DATA:  Initial evaluation for mental status change, unknown cause. EXAM: CT HEAD WITHOUT CONTRAST TECHNIQUE: Contiguous axial images were obtained from the base of the skull through the vertex without intravenous contrast. RADIATION DOSE REDUCTION: This exam was performed according to the departmental dose-optimization program which includes automated exposure control, adjustment of the mA and/or kV according to patient size and/or use of iterative reconstruction technique. COMPARISON:  None Available. FINDINGS: Brain: Mild age-related cerebral atrophy with chronic small vessel ischemic disease. No acute intracranial hemorrhage. No acute large vessel territory infarct. No mass lesion or midline shift. No hydrocephalus or extra-axial fluid collection. Vascular: No abnormal hyperdense vessel. Calcified atherosclerosis present at the skull base. Skull: Scalp soft tissues demonstrate no acute finding. Calvarium intact. Thinning of the parietal calvarium bilaterally, likely congenital. Sinuses/Orbits: Globes and orbital soft tissues within normal limits. Paranasal sinuses and mastoid air cells are clear. Other: None. IMPRESSION: 1. No acute intracranial abnormality. 2. Mild age-related cerebral atrophy with chronic small vessel ischemic disease. Electronically Signed   By: Rise Mu M.D.   On: 09/28/2023 19:38   DG Abd Portable 1V  Result Date:  09/28/2023 CLINICAL DATA:  Abdominal discomfort. EXAM: PORTABLE ABDOMEN - 1 VIEW COMPARISON:  CT 11/16/2018 FINDINGS: Normal bowel gas pattern. Small volume of colonic stool. Intrarenal calculi on prior CT are not well demonstrated on the current exam. Scoliotic curvature with lumbar fusion hardware. The lung bases are clear. Surgical clips in the low pelvis. IMPRESSION: Normal bowel gas pattern. Small volume of colonic stool. Electronically Signed   By: Narda Rutherford M.D.   On: 09/28/2023 16:05      LOS: 5 days    Jacquelin Hawking, MD [facility] 09/29/2023, 5:33 PM   If 7PM-7AM, please contact night-coverage [email or web address]

## 2023-09-29 NOTE — Plan of Care (Signed)
  Problem: Health Behavior/Discharge Planning: Goal: Ability to manage health-related needs will improve Outcome: Not Progressing   Problem: Nutrition: Goal: Adequate nutrition will be maintained Outcome: Not Progressing   Problem: Pain Managment: Goal: General experience of comfort will improve Outcome: Progressing   Problem: Safety: Goal: Ability to remain free from injury will improve Outcome: Progressing

## 2023-09-29 NOTE — Progress Notes (Signed)
Physical Therapy Treatment Patient Details Name: [name] MRN: [id number] DOB: 10/26/1934 Today's Date: 09/29/2023   History of Present Illness 89 y.o. male with degenerative scoliosis and was admitted for elective lumbar stabilization with L2-4 lumbar spinal fusion on 9/16 at [facility]. He refused rehab at discharge and since being home he has not be able to eat or drink much for at least 10 days. Has continuous back pain. No abdominal pain. No nausea, vomiting or diarrhea. He finally decided to start home PT and today PT came and prompted him to present to ED.  Past medical history significant of hx of prostate cancer s/p radiation, CKD stage IV secondary to RCC s/p partial L nephrectomy, HTN, HLD, CAD s/p CABG (2007), T2DM, chronic systolic and diastolic heart failure, and TAVR 06/2018    PT Comments   Pt admitted with above diagnosis.  Pt currently with functional limitations due to the deficits listed below (see PT Problem List). Pt resting in bed when PT arrived. Pt required increased A for bed mobility mod A and instruction for log roll technique, pt required min A for sit to stand  from EOB and pt reported L hip pain and back pain, gait limited today due to pain and fatigue 70 feet in hallway with RW and CGA. Pt left seated in recliner, all needs in place and inquiring per wife not having arrived at the hospital. Pt d/c plan remains appropriate and patient will benefit from continued inpatient follow up therapy, <3 hours/day. Pt will benefit from acute skilled PT to increase their independence and safety with mobility to allow discharge.      If plan is discharge home, recommend the following: A little help with walking and/or transfers;A little help with bathing/dressing/bathroom;Assistance with cooking/housework;Assist for transportation;Help with stairs or ramp for entrance   Can travel by private vehicle     No  Equipment Recommendations  None recommended by PT     Recommendations for Other Services       Precautions / Restrictions Precautions Precautions: Back;Fall Precaution Booklet Issued: No Precaution Comments: educated in log roll technique Restrictions Weight Bearing Restrictions: No     Mobility  Bed Mobility Overal bed mobility: Needs Assistance Bed Mobility: Supine to Sit     Supine to sit: Mod assist, HOB elevated, Used rails     General bed mobility comments: assisted for LEs and trunk with instruction provided on  roll    Transfers Overall transfer level: Needs assistance Equipment used: Rolling walker (2 wheels) Transfers: Sit to/from Stand Sit to Stand: Min assist   Step pivot transfers: Contact guard assist       General transfer comment: light min assist, cues for hand placement    Ambulation/Gait Ambulation/Gait assistance: Contact guard assist, +2 safety/equipment Gait Distance (Feet): 70 Feet Assistive device: Rolling walker (2 wheels) Gait Pattern/deviations: Step-to pattern, Decreased stride length, Trunk flexed Gait velocity: decreased     General Gait Details: limited by fatigue and pain   Stairs             Wheelchair Mobility     Tilt Bed    Modified Rankin (Stroke Patients Only)       Balance Overall balance assessment: Needs assistance Sitting-balance support: Feet supported Sitting balance-Leahy Scale: Fair Sitting balance - Comments: sitting EOB   Standing balance support: Bilateral upper extremity supported, During functional activity, Reliant on assistive device for balance Standing balance-Leahy Scale: Poor  Cognition Arousal: Alert Behavior During Therapy: WFL for tasks assessed/performed Overall Cognitive Status: Impaired/Different from baseline Area of Impairment: Memory                     Memory: Decreased recall of precautions, Decreased short-term memory Following Commands: Follows one step commands with  increased time Safety/Judgement: Decreased awareness of deficits              Exercises      General Comments        Pertinent Vitals/Pain Pain Assessment Pain Assessment: Faces Faces Pain Scale: Hurts little more Breathing: normal Negative Vocalization: none Facial Expression: smiling or inexpressive Body Language: relaxed Consolability: no need to console PAINAD Score: 0 Pain Location: L side of back and L hip Pain Descriptors / Indicators: Sore, Discomfort Pain Intervention(s): Limited activity within patient's tolerance, Monitored during session, Repositioned    Home Living Family/patient expects to be discharged to:: Private residence Living Arrangements: Spouse/significant other Available Help at Discharge: Family;Available 24 hours/day (Has friends that could be called if needed for temporary help.) Type of Home: House Home Access: Stairs to enter Entrance Stairs-Rails:  (unsure) Entrance Stairs-Number of Steps: 2   Home Layout: One level Home Equipment: Agricultural consultant (2 wheels);Cane - single point;Shower seat;Grab bars - tub/shower;Grab bars - toilet      Prior Function            PT Goals (current goals can now be found in the care plan section) Acute Rehab PT Goals PT Goal Formulation: With patient Time For Goal Achievement: 10/08/23 Potential to Achieve Goals: Good Progress towards PT goals: Progressing toward goals    Frequency    Min 1X/week      PT Plan      Co-evaluation              AM-PAC PT "6 Clicks" Mobility   Outcome Measure  Help needed turning from your back to your side while in a flat bed without using bedrails?: A Little Help needed moving from lying on your back to sitting on the side of a flat bed without using bedrails?: A Little Help needed moving to and from a bed to a chair (including a wheelchair)?: A Little Help needed standing up from a chair using your arms (e.g., wheelchair or bedside chair)?: A  Little Help needed to walk in hospital room?: A Little Help needed climbing 3-5 steps with a railing? : A Lot 6 Click Score: 17    End of Session Equipment Utilized During Treatment: Gait belt Activity Tolerance: Patient limited by pain;Patient limited by fatigue Patient left: in chair;with call bell/phone within reach;with chair alarm set Nurse Communication: Mobility status;Patient requests pain meds PT Visit Diagnosis: Difficulty in walking, not elsewhere classified (R26.2);Pain Pain - part of body: Hip (back)     Time: 1047-1110 PT Time Calculation (min) (ACUTE ONLY): 23 min  Charges:    $Gait Training: 8-22 mins $Therapeutic Activity: 8-22 mins PT General Charges $$ ACUTE PT VISIT: 1 Visit                     Johnny Bridge, PT Acute Rehab    Jacqualyn Posey 09/29/2023, 2:06 PM

## 2023-09-29 NOTE — Progress Notes (Signed)
Mobility Specialist - Progress Note   09/29/23 1350  Mobility  Activity Transferred from chair to bed  Level of Assistance Contact guard assist, steadying assist  Assistive Device Front wheel walker  Distance Ambulated (ft) 2 ft  Activity Response Tolerated well  Mobility Referral Yes  $Mobility charge 1 Mobility  Mobility Specialist Start Time (ACUTE ONLY) 0141  Mobility Specialist Stop Time (ACUTE ONLY) 0148  Mobility Specialist Time Calculation (min) (ACUTE ONLY) 7 min   Pt received in recliner requesting assistance back to bed. Pt was minA from STS & CG during transfer. No complaints during session. Pt to bed after session with all needs met. Bed alarm on.    [facility]

## 2023-09-30 DIAGNOSIS — N390 Urinary tract infection, site not specified: Secondary | ICD-10-CM | POA: Diagnosis not present

## 2023-09-30 DIAGNOSIS — A419 Sepsis, unspecified organism: Secondary | ICD-10-CM | POA: Diagnosis not present

## 2023-09-30 MED ORDER — JUVEN PO PACK: 1.0000 | PACK | Freq: Two times a day (BID) | ORAL | Status: AC

## 2023-09-30 MED ORDER — GLIPIZIDE 10 MG PO TABS: 5.0000 mg | ORAL_TABLET | Freq: Two times a day (BID) | ORAL | Status: DC

## 2023-09-30 MED ORDER — ENSURE MAX PROTEIN PO LIQD: 11.0000 [oz_av] | Freq: Every day | ORAL | Status: AC

## 2023-09-30 MED ORDER — CIPROFLOXACIN HCL 500 MG PO TABS: 500.0000 mg | ORAL_TABLET | Freq: Two times a day (BID) | ORAL | Status: AC

## 2023-09-30 MED ORDER — DOXYCYCLINE HYCLATE 100 MG PO TABS: 100.0000 mg | ORAL_TABLET | Freq: Two times a day (BID) | ORAL | Status: AC

## 2023-09-30 MED ORDER — POLYETHYLENE GLYCOL 3350 17 G PO PACK: 17.0000 g | PACK | Freq: Every day | ORAL | 0 refills | Status: DC | PRN

## 2023-09-30 MED ORDER — HYDROCODONE-ACETAMINOPHEN 10-325 MG PO TABS: 1.0000 | ORAL_TABLET | ORAL | 0 refills | Status: DC | PRN

## 2023-09-30 MED ORDER — METOPROLOL SUCCINATE ER 25 MG PO TB24: 25.0000 mg | ORAL_TABLET | Freq: Every day | ORAL | Status: DC

## 2023-09-30 MED ORDER — MELATONIN 5 MG PO TABS: 5.0000 mg | ORAL_TABLET | Freq: Every day | ORAL | Status: DC

## 2023-09-30 NOTE — Discharge Instructions (Signed)
[name],  You were in the hospital and treated for an infection in your urine and a skin infection over the site of your surgery. Thankfully there was no infection in your back. Please finish your antibiotics. You also had some confusion that has improved. This may have been delirium from your hospitalization and/or related to the infection; thankfully this has resolved.

## 2023-09-30 NOTE — Discharge Summary (Signed)
Physician Discharge Summary   Patient: [name] MRN: [id number] DOB: [birth year]  Admit date:     09/23/2023  Discharge date: 09/30/23  Discharge Physician: Jacquelin Hawking, MD   PCP: Emilio Aspen, MD   Recommendations at discharge:  PCP visit for hospital follow-up Neurosurgery visit for post-op follow-up  Discharge Diagnoses: Principal Problem:   Sepsis secondary to UTI [facility]) Active Problems:   Hyperlipidemia associated with type 2 diabetes mellitus (HCC)   S/P TAVR (transcatheter aortic valve replacement)   Chronic combined systolic and diastolic heart failure (HCC)   S/P CABG (coronary artery bypass graft)   Hypotension   Wound cellulitis after surgery  Resolved Problems:   * No resolved hospital problems. *  Hospital Course: [name] is a 89 y.o. male with a history of prostate cancer status post radiation, CKD stage IV, renal cell carcinoma status post nephrectomy, hypertension, hyperlipidemia, CAD status post CABG, diabetes mellitus type 2, chronic combined heart failure, TAVR, chronic back pain.  Patient presented secondary to generalized weakness, back pain and decreased oral intake and was found to have evidence of surgical wound infection with cellulitis in addition to sepsis secondary to UTI.  Patient started empirically on antibiotics with improvement of symptoms.  Assessment and Plan:  Sepsis secondary to UTI Present on admission. Urinalysis suggestive of UTI. Empiric Vancomycin/Cefepime started for treatment. No urine culture obtained. Blood cultures obtained and are no growth. Patient to complete 7 days of antibiotics today.   Surgical wound infection Cellulitis Patient started on empiric treatment with Vancomycin/Cefepime and transitioned to Doxycycline/Ciprofloxacin. Continue Ciprofloxacin and doxycycline with plan to complete a 10 day course of antibiotics. Last day of antibiotics is 10/10.   CAD History of CABG and TAVR. Asymptomatic. Patient is  on aspirin and metoprolol as an outpatient. Metoprolol held secondary to hypotension. Continue aspirin and metoprolol on discharge. Would recommend holding metoprolol for systolic blood pressure < 100 mmHg.   Primary hypertension Patient is on metoprolol as an outpatient which is held secondary to hypotension.   Hypotension Patient started on midodrine. Presumed secondary to pain management and infection, however it appears patient has a chronic history of hypotension. Midodrine discontinued. Blood pressure stable.   Chronic pain Continue Norco PRN   Chronic combined heart failure Stable. Patient is on metoprolol and torsemide as an outpatient which are held secondary to hypotension.   Acute metabolic encephalopathy Unclear etiology. Possibly delirium. CT head unremarkable. Ammonia level normal. Vitamin B12 normal. TSH normal. Resolved with continued treatment of infection.   Diarrhea Improved.   Abdominal pain Resolved.   Consultants: None Procedures performed: None  Disposition: Skilled nursing facility Diet recommendation: Cardiac diet   DISCHARGE MEDICATION: Allergies as of 09/30/2023       Reactions   Fish Allergy Swelling   Dark fish- salmon, tuna    Lipitor [atorvastatin] Other (See Comments)   Muscle pain   Penicillins Swelling   SWELLING OF THE FEET Has patient had a PCN reaction causing immediate rash, facial/tongue/throat swelling, SOB or lightheadedness with hypotension: no Has patient had a PCN reaction causing severe rash involving mucus membranes or skin necrosis: no Has patient had a PCN reaction that required hospitalization no Has patient had a PCN reaction occurring within the last 10 years: no If all of the above answers are "NO", then may proceed with Cephalosporin use.   Simvastatin Other (See Comments)   MUSCLE PAIN   Adacel [tetanus-diphth-acell Pertussis]    UNSPECIFIED REACTION    Lasix [furosemide]  Swelling   SWELLING REACTION UNSPECIFIED     Septra [sulfamethoxazole-trimethoprim]    UNSPECIFIED REACTION    Flexeril [cyclobenzaprine] Other (See Comments)   Causes confusion   Pantoprazole Diarrhea        Medication List     STOP taking these medications    cefdinir 300 MG capsule Commonly known as: OMNICEF   cephALEXin 500 MG capsule Commonly known as: KEFLEX   oxyCODONE-acetaminophen 5-325 MG tablet Commonly known as: PERCOCET/ROXICET   predniSONE 10 MG tablet Commonly known as: DELTASONE   sulfamethoxazole-trimethoprim 800-160 MG tablet Commonly known as: BACTRIM DS       TAKE these medications    aspirin 81 MG chewable tablet Chew 81 mg by mouth every evening.   ciprofloxacin 500 MG tablet Commonly known as: CIPRO Take 1 tablet (500 mg total) by mouth 2 (two) times daily for 1 dose.   doxycycline 100 MG tablet Commonly known as: VIBRA-TABS Take 1 tablet (100 mg total) by mouth 2 (two) times daily for 1 dose.   nutrition supplement (JUVEN) Pack Take 1 packet by mouth 2 (two) times daily between meals.   Ensure Max Protein Liqd Take 330 mLs (11 oz total) by mouth daily. Start taking on: October 01, 2023   glipiZIDE 10 MG tablet Commonly known as: GLUCOTROL Take 0.5 tablets (5 mg total) by mouth 2 (two) times daily before a meal. Start taking on: October 02, 2023 What changed: These instructions start on October 02, 2023. If you are unsure what to do until then, ask your doctor or other care provider.   HYDROcodone-acetaminophen 10-325 MG tablet Commonly known as: NORCO Take 1 tablet by mouth every 4 (four) hours as needed. Per pt on regular basis for chronic pain for severe back pain What changed:  when to take this reasons to take this   lidocaine 5 % Commonly known as: LIDODERM Place 1 patch onto the skin as needed. Remove & Discard patch within 12 hours or as directed by MD   melatonin 5 MG Tabs Take 1 tablet (5 mg total) by mouth at bedtime.   metoprolol succinate 25 MG 24 hr  tablet Commonly known as: Toprol XL Take 1 tablet (25 mg total) by mouth daily. Hold for blood pressure less than 100/60 mmHg What changed: additional instructions   multivitamin tablet Take 1 tablet by mouth at bedtime.   nitroGLYCERIN 0.4 MG SL tablet Commonly known as: NITROSTAT Place 1 tablet (0.4 mg total) under the tongue every 5 (five) minutes as needed for chest pain.   OVER THE COUNTER MEDICATION Apply topically 4 (four) times daily as needed. Neuro Soothe Cream for nerve pain , hands/ feet   OVER THE COUNTER MEDICATION Apply 1 application  topically daily as needed (Back pain). CBD Roll on   OVER THE COUNTER MEDICATION Take 8 oz by mouth 2 (two) times daily. Blueprint to Nutrition Mix   Vaspro Plus, Greenberry and Inflamm with water and crystal light packets   OVER THE COUNTER MEDICATION Take 1 tablet by mouth at bedtime as needed (Sleep). Relaxium sleep   polyethylene glycol 17 g packet Commonly known as: MIRALAX / GLYCOLAX Take 17 g by mouth daily as needed for mild constipation.   psyllium 95 % Pack Commonly known as: HYDROCIL/METAMUCIL Take 1 packet by mouth daily as needed for mild constipation.   ROLAIDS PO Take by mouth as needed (reflux).   Systane Complete 0.6 % Soln Generic drug: Propylene Glycol Place 1 drop into the left eye  2 (two) times daily.   tamsulosin 0.4 MG Caps capsule Commonly known as: FLOMAX Take 0.4 mg by mouth 2 (two) times daily.   torsemide 20 MG tablet Commonly known as: DEMADEX TAKE 1 TABLET BY MOUTH EVERY MONDAY, WEDNESDAY, AND FRIDAY What changed: See the new instructions.   Vitamin D-3 125 MCG (5000 UT) Tabs Take 5,000 Units by mouth daily.        Contact information for follow-up providers     Emilio Aspen, MD. Schedule an appointment as soon as possible for a visit in 1 week(s).   Specialty: Internal Medicine Contact information: [address] [place] [address] [phone]               Contact information for after-discharge care     Destination     [facility] .   Service: Skilled Nursing Contact information: [address] Test Update Address [place] [address] [phone]                    Discharge Exam: BP 107/78 (BP Location: Right Arm)   Pulse 69   Temp 98 F (36.7 C) (Oral)   Resp 18   Ht 5' 6.5" (1.689 m)   Wt 74.8 kg   SpO2 97%   BMI 26.23 kg/m   General exam: Appears calm and comfortable  Respiratory system: Clear to auscultation. Respiratory effort normal. Cardiovascular system: S1 & S2 heard Gastrointestinal system: Abdomen is nondistended, soft and nontender. Normal bowel sounds heard. Central nervous system: Alert and oriented.  Psychiatry: Judgement and insight appear normal. Mood & affect appropriate.   Condition at discharge: stable  The results of significant diagnostics from this hospitalization (including imaging, microbiology, ancillary and laboratory) are listed below for reference.   Imaging Studies: CT ABDOMEN PELVIS WO CONTRAST  Result Date: 09/28/2023 CLINICAL DATA:  Diffuse abdominal pain EXAM: CT ABDOMEN AND PELVIS WITHOUT CONTRAST TECHNIQUE: Multidetector CT imaging of the abdomen and pelvis was performed following the standard protocol without IV contrast. RADIATION DOSE REDUCTION: This exam was performed according to the departmental dose-optimization program which includes automated exposure control, adjustment of the mA and/or kV according to patient size and/or use of iterative reconstruction technique. COMPARISON:  CT 06/03/2017, 11/16/2018 FINDINGS: Lower chest: Lung bases demonstrate no acute airspace disease. Mild reticular disease in the right lung base. Partially visualized aortic valve prosthesis. Coronary vascular calcification. Hepatobiliary: No focal liver abnormality is seen. No gallstones, gallbladder wall thickening, or biliary dilatation. Pancreas:  Unremarkable. No pancreatic ductal dilatation or surrounding inflammatory changes. Spleen: Normal in size without focal abnormality. Adrenals/Urinary Tract: Adrenal glands are within normal limits. Kidneys show no hydronephrosis. Multiple small right kidney stones measuring up to 3 mm. Cortical scarring left kidney with partial nephrectomy changes. Rim calcified area with internal fat density at the mid pole likely due to postoperative change. The bladder is unremarkable Stomach/Bowel: The stomach is nonenlarged. No dilated small bowel. No acute bowel wall thickening. Diverticular disease of the left colon. Vascular/Lymphatic: Moderate aortic atherosclerosis. No aneurysm. No suspicious lymph nodes Reproductive: Multiple prostate seeds Other: Negative for pelvic effusion or free air. Small fat containing supraumbilical ventral hernias. Musculoskeletal: Posterior spinal hardware L1 through L4. Fusion of the L4 on L5 vertebra. No acute osseous abnormality. Thin gas and fluid collection within the subcutaneous soft tissues superficial to the paraspinal muscles at the L2-L3 level, extending inferiorly to the level of L5. IMPRESSION: 1. Negative for hydronephrosis. Multiple small nonobstructing  right kidney stones. 2. Postoperative changes of the left kidney. 3. Diverticular disease of the left colon without acute inflammatory process. 4. Small fat containing supraumbilical ventral hernias. 5. Posterior spinal hardware L1 through L4. Thin gas and fluid collection within the subcutaneous soft tissues superficial to the paraspinal muscles at the L2-L3 level, extending inferiorly to the level of L5. This could be due to resolving postoperative change, correlate with surgical history and direct inspection for any clinical signs of infection. 6. Aortic atherosclerosis. Aortic Atherosclerosis (ICD10-I70.0). Electronically Signed   By: Jasmine Pang M.D.   On: 09/28/2023 20:16   CT HEAD WO CONTRAST ( )  Result Date:  09/28/2023 CLINICAL DATA:  Initial evaluation for mental status change, unknown cause. EXAM: CT HEAD WITHOUT CONTRAST TECHNIQUE: Contiguous axial images were obtained from the base of the skull through the vertex without intravenous contrast. RADIATION DOSE REDUCTION: This exam was performed according to the departmental dose-optimization program which includes automated exposure control, adjustment of the mA and/or kV according to patient size and/or use of iterative reconstruction technique. COMPARISON:  None Available. FINDINGS: Brain: Mild age-related cerebral atrophy with chronic small vessel ischemic disease. No acute intracranial hemorrhage. No acute large vessel territory infarct. No mass lesion or midline shift. No hydrocephalus or extra-axial fluid collection. Vascular: No abnormal hyperdense vessel. Calcified atherosclerosis present at the skull base. Skull: Scalp soft tissues demonstrate no acute finding. Calvarium intact. Thinning of the parietal calvarium bilaterally, likely congenital. Sinuses/Orbits: Globes and orbital soft tissues within normal limits. Paranasal sinuses and mastoid air cells are clear. Other: None. IMPRESSION: 1. No acute intracranial abnormality. 2. Mild age-related cerebral atrophy with chronic small vessel ischemic disease. Electronically Signed   By: Rise Mu M.D.   On: 09/28/2023 19:38   DG Abd Portable 1V  Result Date: 09/28/2023 CLINICAL DATA:  Abdominal discomfort. EXAM: PORTABLE ABDOMEN - 1 VIEW COMPARISON:  CT 11/16/2018 FINDINGS: Normal bowel gas pattern. Small volume of colonic stool. Intrarenal calculi on prior CT are not well demonstrated on the current exam. Scoliotic curvature with lumbar fusion hardware. The lung bases are clear. Surgical clips in the low pelvis. IMPRESSION: Normal bowel gas pattern. Small volume of colonic stool. Electronically Signed   By: Narda Rutherford M.D.   On: 09/28/2023 16:05   MR LUMBAR SPINE WO CONTRAST  Result Date:  09/23/2023 CLINICAL DATA:  Low back pain EXAM: MRI LUMBAR SPINE WITHOUT CONTRAST TECHNIQUE: Multiplanar, multisequence MR imaging of the lumbar spine was performed. No intravenous contrast was administered. COMPARISON:  12/18/2016 FINDINGS: Segmentation:  Standard Alignment:  Grade 1 anterolisthesis at L3-4 Vertebrae: L1-4 posterior instrumented fusion. No acute abnormality. Conus medullaris and cauda equina: Conus extends to the L2 level. Conus and cauda equina appear normal. Paraspinal and other soft tissues: Negative Disc levels: T12-L1: Unremarkable. L1-L2: Postfusion changes. Unchanged small disc bulge with endplate spurring. No spinal canal stenosis. No neural foraminal stenosis. L2-L3: Postfusion changes. Unchanged right asymmetric disc bulge. Narrowing of both lateral recesses without central spinal canal stenosis. Mild right neural foraminal stenosis. L3-L4: Postfusion changes. Small disc bulge with endplate spurring, unchanged. No spinal canal stenosis. Unchanged moderate right and mild left neural foraminal stenosis. L4-L5: Disc space narrowing with endplate spurring. No spinal canal stenosis. Unchanged severe left neural foraminal stenosis. L5-S1: Moderate facet hypertrophy and small disc bulge. There is a new right foraminal protrusion with annular fissure. No spinal canal stenosis. Mild right neural foraminal stenosis. Visualized sacrum: Normal. IMPRESSION: 1. New right foraminal protrusion with annular fissure at L5-S1  with mild right neural foraminal stenosis. 2. Unchanged severe left L4-5 neural foraminal stenosis. 3. Unchanged moderate right L3-4 neural foraminal stenosis. 4. L1-4 posterior instrumented fusion without residual spinal canal stenosis. Electronically Signed   By: Deatra Robinson M.D.   On: 09/23/2023 23:31   DG Chest Portable 1 View  Result Date: 09/23/2023 CLINICAL DATA:  Back surgery on 09/16. Failure to thrive since. Feels dizzy. EXAM: PORTABLE CHEST 1 VIEW COMPARISON:  Chest  radiographs 05/27/2023 FINDINGS: Sternotomy. CABG. TAVR. Aortic atherosclerotic calcification. Stable cardiomediastinal silhouette. Low lung volumes accentuate pulmonary vascularity. No focal consolidation, pleural effusion, or pneumothorax. No displaced rib fractures. IMPRESSION: No active disease. Electronically Signed   By: Minerva Fester M.D.   On: 09/23/2023 22:15   CARDIAC CATHETERIZATION  Result Date: 09/01/2023   Mid LM to Dist LM lesion is 100% stenosed.  LIMA to LAD is patent.  SCG to ramus is occluded.   Prox RCA lesion is 100% stenosed.  Mid RCA to Dist RCA lesion is 100% stenosed. SVG to PDA is patent.  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   Dist Graft lesion is 50% stenosed.   Ost 1st Diag lesion is 100% stenosed.   SVG.   In the absence of any other complications or medical issues, we expect the patient to be ready for discharge from a cath perspective on 09/01/2023.   Recommend Aspirin 81mg  daily for moderate CAD. Severe three-vessel CAD.  Patent LIMA to LAD.  Patent SVG to PDA.  Aneurysmal segment at the distal anastomosis which is new from prior catheterization.  Medical therapy.  Consider f/u imaging    Microbiology: Results for orders placed or performed during the hospital encounter of 09/23/23  Blood culture (routine x 2)     Status: None   Collection Time: 09/23/23 10:32 PM   Specimen: BLOOD  Result Value Ref Range Status   Specimen Description   Final    BLOOD RIGHT ANTECUBITAL Performed at [facility], [address] [place], [place] [address]    Special Requests   Final    BOTTLES DRAWN AEROBIC AND ANAEROBIC Blood Culture adequate volume Performed at [facility], [address] [place], [place] [address]    Culture   Final    NO GROWTH 5 DAYS Performed at [facility], [address], [place], [place] [address]    Report Status 09/29/2023 FINAL  Final  Blood culture (routine x 2)     Status: None    Collection Time: 09/23/23 10:39 PM   Specimen: BLOOD RIGHT FOREARM  Result Value Ref Range Status   Specimen Description   Final    BLOOD RIGHT FOREARM Performed at [facility], [address], [place], [place] [address]    Special Requests   Final    BOTTLES DRAWN AEROBIC AND ANAEROBIC Blood Culture adequate volume Performed at [facility], [address] [place], [place] [address]    Culture   Final    NO GROWTH 5 DAYS Performed at [facility], [address] [place], [place] [address]    Report Status 09/29/2023 FINAL  Final    Labs: CBC: Recent Labs  Lab 09/23/23 1621 09/24/23 0818 09/26/23 0533 09/27/23 0820 09/28/23 0526 09/29/23 0540  WBC 14.0* 12.0* 9.3 9.5 10.3 9.7  NEUTROABS 10.6* 9.0*  --   --   --  6.5  HGB 10.0* 9.5* 9.2* 9.9* 9.8* 9.4*  HCT 32.4* 30.1*  29.5* 31.1* 30.5* 29.6*  MCV 101.6* 101.3* 101.0* 99.0 98.1 99.3  PLT 475* 380 334 375 337 318   Basic Metabolic Panel: Recent Labs  Lab 09/24/23 0818 09/26/23 0533 09/27/23 0820 09/28/23 0526 09/29/23 0540  NA 137 136 135 137 135  K 4.6 4.0 4.2 4.2 3.9  CL 103 101 99 99 103  CO2 24 23 25 25 23   GLUCOSE 180* 179* 194* 190* 173*  BUN 26* 17 20 26* 27*  CREATININE 1.50* 1.00 1.31* 1.25* 1.28*  CALCIUM 9.1 9.2 9.7 10.1 9.2  MG 1.9 1.8 1.8 1.6* 1.9   Liver Function Tests: Recent Labs  Lab 09/24/23 0818 09/29/23 0540  AST 22 26  ALT 14 17  ALKPHOS 62 64  BILITOT 1.0 0.7  PROT 6.5 6.5  ALBUMIN 2.3* 2.3*    Discharge time spent: 35 minutes.  Signed: Jacquelin Hawking, MD [facility] 09/30/2023

## 2023-09-30 NOTE — TOC Transition Note (Signed)
Transition of Care [facility]) - CM/SW Discharge Note   Patient Details  Name: [name] MRN: [id number] Date of Birth: [birth year]  [facility] CM/SW Contact:  [name], LCSW Phone Number: 09/30/2023, 1:10 PM   Clinical Narrative:    Pt to transfer to [facility] for SNF placement. Pt to go to room 604b. RN to call report to [phone]. Spoke with pt's nephew, Mellody Dance and confirmed discharge plans. DC packet placed at RN station. [facility] called at 1:15pm.   Final next level of care: Skilled Nursing Facility Barriers to Discharge: No Barriers Identified   Patient Goals and CMS Choice CMS Medicare.gov Compare Post Acute Care list provided to:: Patient Choice offered to / list presented to :  (wainting on recommendation)  Discharge Placement     Existing PASRR number confirmed : 09/25/23          Patient chooses bed at: [facility] Patient to be transferred to facility by: [facility] Name of family member notified: Mellody Dance Patient and family notified of of transfer: 09/30/23  Discharge Plan and Services Additional resources added to the After Visit Summary for   In-house Referral: NA Discharge Planning Services: NA            DME Arranged: N/A DME Agency: NA                  Social Determinants of Health (SDOH) Interventions SDOH Screenings   Food Insecurity: No Food Insecurity (09/24/2023)  Housing: High Risk (09/24/2023)  Transportation Needs: No Transportation Needs (09/24/2023)  Utilities: Not At Risk (09/24/2023)  Social Connections: Unknown (05/05/2022)   Received from [facility]  Stress: No Stress Concern Present (09/06/2023)   Received from [facility]  Tobacco Use: Medium Risk (09/23/2023)     Readmission Risk Interventions     No data to display

## 2023-10-02 LAB — VITAMIN B1: Vitamin B1 (Thiamine): 96.2 nmol/L (ref 66.5–200.0)

## 2023-10-20 DIAGNOSIS — G8929 Other chronic pain: Secondary | ICD-10-CM

## 2023-10-20 DIAGNOSIS — E1169 Type 2 diabetes mellitus with other specified complication: Secondary | ICD-10-CM | POA: Diagnosis not present

## 2023-10-20 DIAGNOSIS — M791 Myalgia, unspecified site: Secondary | ICD-10-CM | POA: Diagnosis not present

## 2023-10-20 DIAGNOSIS — I5042 Chronic combined systolic (congestive) and diastolic (congestive) heart failure: Secondary | ICD-10-CM | POA: Diagnosis not present

## 2023-10-20 DIAGNOSIS — L03312 Cellulitis of back [any part except buttock]: Secondary | ICD-10-CM | POA: Diagnosis not present

## 2023-10-21 DIAGNOSIS — G8929 Other chronic pain: Secondary | ICD-10-CM | POA: Diagnosis not present

## 2023-10-21 DIAGNOSIS — L03312 Cellulitis of back [any part except buttock]: Secondary | ICD-10-CM | POA: Diagnosis not present

## 2023-10-21 DIAGNOSIS — M791 Myalgia, unspecified site: Secondary | ICD-10-CM

## 2023-10-21 DIAGNOSIS — E1169 Type 2 diabetes mellitus with other specified complication: Secondary | ICD-10-CM | POA: Diagnosis not present

## 2023-10-21 DIAGNOSIS — I5042 Chronic combined systolic (congestive) and diastolic (congestive) heart failure: Secondary | ICD-10-CM | POA: Diagnosis not present

## 2023-10-28 ENCOUNTER — Other Ambulatory Visit: Payer: Self-pay

## 2023-10-28 ENCOUNTER — Emergency Department (HOSPITAL_COMMUNITY): Payer: PPO

## 2023-10-28 ENCOUNTER — Encounter (HOSPITAL_COMMUNITY): Payer: Self-pay

## 2023-10-28 ENCOUNTER — Inpatient Hospital Stay (HOSPITAL_COMMUNITY)
Admission: EM | Admit: 2023-10-28 | Discharge: 2023-11-05 | DRG: 682 | Disposition: A | Discharge status: Other Acute Inpatient Hospital | Payer: PPO | Source: Skilled Nursing Facility | Attending: Internal Medicine | Admitting: Internal Medicine

## 2023-10-28 DIAGNOSIS — N400 Enlarged prostate without lower urinary tract symptoms: Secondary | ICD-10-CM | POA: Diagnosis present

## 2023-10-28 DIAGNOSIS — Z880 Allergy status to penicillin: Secondary | ICD-10-CM

## 2023-10-28 DIAGNOSIS — N179 Acute kidney failure, unspecified: Secondary | ICD-10-CM | POA: Diagnosis not present

## 2023-10-28 DIAGNOSIS — Z8672 Personal history of thrombophlebitis: Secondary | ICD-10-CM

## 2023-10-28 DIAGNOSIS — R55 Syncope and collapse: Secondary | ICD-10-CM

## 2023-10-28 DIAGNOSIS — Z79899 Other long term (current) drug therapy: Secondary | ICD-10-CM

## 2023-10-28 DIAGNOSIS — N1831 Chronic kidney disease, stage 3a: Secondary | ICD-10-CM

## 2023-10-28 DIAGNOSIS — R4182 Altered mental status, unspecified: Secondary | ICD-10-CM | POA: Diagnosis present

## 2023-10-28 DIAGNOSIS — Z1152 Encounter for screening for COVID-19: Secondary | ICD-10-CM

## 2023-10-28 DIAGNOSIS — Z961 Presence of intraocular lens: Secondary | ICD-10-CM | POA: Diagnosis present

## 2023-10-28 DIAGNOSIS — E8809 Other disorders of plasma-protein metabolism, not elsewhere classified: Secondary | ICD-10-CM | POA: Diagnosis present

## 2023-10-28 DIAGNOSIS — I5022 Chronic systolic (congestive) heart failure: Secondary | ICD-10-CM | POA: Diagnosis present

## 2023-10-28 DIAGNOSIS — Z923 Personal history of irradiation: Secondary | ICD-10-CM

## 2023-10-28 DIAGNOSIS — R310 Gross hematuria: Secondary | ICD-10-CM | POA: Diagnosis not present

## 2023-10-28 DIAGNOSIS — Z888 Allergy status to other drugs, medicaments and biological substances status: Secondary | ICD-10-CM

## 2023-10-28 DIAGNOSIS — K5730 Diverticulosis of large intestine without perforation or abscess without bleeding: Secondary | ICD-10-CM | POA: Diagnosis present

## 2023-10-28 DIAGNOSIS — I10 Essential (primary) hypertension: Secondary | ICD-10-CM | POA: Diagnosis present

## 2023-10-28 DIAGNOSIS — R5381 Other malaise: Secondary | ICD-10-CM

## 2023-10-28 DIAGNOSIS — Z7984 Long term (current) use of oral hypoglycemic drugs: Secondary | ICD-10-CM

## 2023-10-28 DIAGNOSIS — R319 Hematuria, unspecified: Secondary | ICD-10-CM

## 2023-10-28 DIAGNOSIS — Z951 Presence of aortocoronary bypass graft: Secondary | ICD-10-CM

## 2023-10-28 DIAGNOSIS — Z87891 Personal history of nicotine dependence: Secondary | ICD-10-CM

## 2023-10-28 DIAGNOSIS — Z85528 Personal history of other malignant neoplasm of kidney: Secondary | ICD-10-CM

## 2023-10-28 DIAGNOSIS — K219 Gastro-esophageal reflux disease without esophagitis: Secondary | ICD-10-CM | POA: Diagnosis present

## 2023-10-28 DIAGNOSIS — T402X5A Adverse effect of other opioids, initial encounter: Secondary | ICD-10-CM | POA: Diagnosis present

## 2023-10-28 DIAGNOSIS — R410 Disorientation, unspecified: Secondary | ICD-10-CM | POA: Diagnosis not present

## 2023-10-28 DIAGNOSIS — Z952 Presence of prosthetic heart valve: Secondary | ICD-10-CM

## 2023-10-28 DIAGNOSIS — D631 Anemia in chronic kidney disease: Secondary | ICD-10-CM | POA: Diagnosis present

## 2023-10-28 DIAGNOSIS — Z860101 Personal history of adenomatous and serrated colon polyps: Secondary | ICD-10-CM

## 2023-10-28 DIAGNOSIS — E785 Hyperlipidemia, unspecified: Secondary | ICD-10-CM | POA: Diagnosis present

## 2023-10-28 DIAGNOSIS — N184 Chronic kidney disease, stage 4 (severe): Secondary | ICD-10-CM | POA: Diagnosis present

## 2023-10-28 DIAGNOSIS — N2581 Secondary hyperparathyroidism of renal origin: Secondary | ICD-10-CM | POA: Diagnosis present

## 2023-10-28 DIAGNOSIS — M1A9XX0 Chronic gout, unspecified, without tophus (tophi): Secondary | ICD-10-CM | POA: Diagnosis present

## 2023-10-28 DIAGNOSIS — E119 Type 2 diabetes mellitus without complications: Secondary | ICD-10-CM

## 2023-10-28 DIAGNOSIS — Z9842 Cataract extraction status, left eye: Secondary | ICD-10-CM

## 2023-10-28 DIAGNOSIS — G9341 Metabolic encephalopathy: Secondary | ICD-10-CM

## 2023-10-28 DIAGNOSIS — E860 Dehydration: Secondary | ICD-10-CM | POA: Diagnosis present

## 2023-10-28 DIAGNOSIS — Z515 Encounter for palliative care: Secondary | ICD-10-CM

## 2023-10-28 DIAGNOSIS — Z9841 Cataract extraction status, right eye: Secondary | ICD-10-CM

## 2023-10-28 DIAGNOSIS — I2510 Atherosclerotic heart disease of native coronary artery without angina pectoris: Secondary | ICD-10-CM | POA: Diagnosis present

## 2023-10-28 DIAGNOSIS — I700 Atherosclerosis of aorta: Secondary | ICD-10-CM | POA: Diagnosis present

## 2023-10-28 DIAGNOSIS — Z8551 Personal history of malignant neoplasm of bladder: Secondary | ICD-10-CM

## 2023-10-28 DIAGNOSIS — Z8546 Personal history of malignant neoplasm of prostate: Secondary | ICD-10-CM

## 2023-10-28 DIAGNOSIS — Z86718 Personal history of other venous thrombosis and embolism: Secondary | ICD-10-CM

## 2023-10-28 DIAGNOSIS — E1122 Type 2 diabetes mellitus with diabetic chronic kidney disease: Secondary | ICD-10-CM | POA: Diagnosis present

## 2023-10-28 DIAGNOSIS — Z79891 Long term (current) use of opiate analgesic: Secondary | ICD-10-CM

## 2023-10-28 DIAGNOSIS — I959 Hypotension, unspecified: Secondary | ICD-10-CM | POA: Insufficient documentation

## 2023-10-28 DIAGNOSIS — Z905 Acquired absence of kidney: Secondary | ICD-10-CM

## 2023-10-28 DIAGNOSIS — J690 Pneumonitis due to inhalation of food and vomit: Secondary | ICD-10-CM | POA: Diagnosis not present

## 2023-10-28 DIAGNOSIS — Z91013 Allergy to seafood: Secondary | ICD-10-CM

## 2023-10-28 DIAGNOSIS — I130 Hypertensive heart and chronic kidney disease with heart failure and stage 1 through stage 4 chronic kidney disease, or unspecified chronic kidney disease: Secondary | ICD-10-CM | POA: Diagnosis present

## 2023-10-28 DIAGNOSIS — G928 Other toxic encephalopathy: Secondary | ICD-10-CM | POA: Diagnosis present

## 2023-10-28 DIAGNOSIS — G8929 Other chronic pain: Secondary | ICD-10-CM | POA: Diagnosis present

## 2023-10-28 DIAGNOSIS — Z7982 Long term (current) use of aspirin: Secondary | ICD-10-CM

## 2023-10-28 DIAGNOSIS — Y929 Unspecified place or not applicable: Secondary | ICD-10-CM

## 2023-10-28 DIAGNOSIS — E871 Hypo-osmolality and hyponatremia: Secondary | ICD-10-CM | POA: Diagnosis present

## 2023-10-28 LAB — CBC WITH DIFFERENTIAL/PLATELET
Abs Immature Granulocytes: 0.03 10*3/uL (ref 0.00–0.07)
Basophils Absolute: 0.1 10*3/uL (ref 0.0–0.1)
Basophils Relative: 1 %
Eosinophils Absolute: 0.3 10*3/uL (ref 0.0–0.5)
Eosinophils Relative: 3 %
HCT: 27.3 % — ABNORMAL LOW (ref 39.0–52.0)
Hemoglobin: 8.4 g/dL — ABNORMAL LOW (ref 13.0–17.0)
Immature Granulocytes: 0 %
Lymphocytes Relative: 22 %
Lymphs Abs: 2.2 10*3/uL (ref 0.7–4.0)
MCH: 30.2 pg (ref 26.0–34.0)
MCHC: 30.8 g/dL (ref 30.0–36.0)
MCV: 98.2 fL (ref 80.0–100.0)
Monocytes Absolute: 0.8 10*3/uL (ref 0.1–1.0)
Monocytes Relative: 8 %
Neutro Abs: 6.6 10*3/uL (ref 1.7–7.7)
Neutrophils Relative %: 66 %
Platelets: 328 10*3/uL (ref 150–400)
RBC: 2.78 MIL/uL — ABNORMAL LOW (ref 4.22–5.81)
RDW: 14.6 % (ref 11.5–15.5)
WBC: 10 10*3/uL (ref 4.0–10.5)
nRBC: 0 % (ref 0.0–0.2)

## 2023-10-28 LAB — COMPREHENSIVE METABOLIC PANEL
ALT: 8 U/L (ref 0–44)
AST: 16 U/L (ref 15–41)
Albumin: 2.4 g/dL — ABNORMAL LOW (ref 3.5–5.0)
Alkaline Phosphatase: 49 U/L (ref 38–126)
Anion gap: 9 (ref 5–15)
BUN: 32 mg/dL — ABNORMAL HIGH (ref 8–23)
CO2: 26 mmol/L (ref 22–32)
Calcium: 10.3 mg/dL (ref 8.9–10.3)
Chloride: 101 mmol/L (ref 98–111)
Creatinine, Ser: 1.56 mg/dL — ABNORMAL HIGH (ref 0.61–1.24)
GFR, Estimated: 43 mL/min — ABNORMAL LOW (ref >60)
Glucose, Bld: 147 mg/dL — ABNORMAL HIGH (ref 70–99)
Potassium: 3.6 mmol/L (ref 3.5–5.1)
Sodium: 136 mmol/L (ref 135–145)
Total Bilirubin: 0.4 mg/dL (ref <1.2)
Total Protein: 6.6 g/dL (ref 6.5–8.1)

## 2023-10-28 LAB — PROTIME-INR
INR: 1.1 (ref 0.8–1.2)
Prothrombin Time: 14.8 s (ref 11.4–15.2)

## 2023-10-28 LAB — URINALYSIS, W/ REFLEX TO CULTURE (INFECTION SUSPECTED)
Bacteria, UA: NONE SEEN
Bilirubin Urine: NEGATIVE
Glucose, UA: NEGATIVE mg/dL
Hgb urine dipstick: NEGATIVE
Ketones, ur: NEGATIVE mg/dL
Nitrite: NEGATIVE
Protein, ur: 30 mg/dL — AB
Specific Gravity, Urine: 1.015 (ref 1.005–1.030)
pH: 5 (ref 5.0–8.0)

## 2023-10-28 LAB — GLUCOSE, CAPILLARY: Glucose-Capillary: 140 mg/dL — ABNORMAL HIGH (ref 70–99)

## 2023-10-28 LAB — RESP PANEL BY RT-PCR (RSV, FLU A&B, COVID)  RVPGX2
Influenza A by PCR: NEGATIVE
Influenza B by PCR: NEGATIVE
Resp Syncytial Virus by PCR: NEGATIVE
SARS Coronavirus 2 by RT PCR: NEGATIVE

## 2023-10-28 LAB — APTT: aPTT: 35 s (ref 24–36)

## 2023-10-28 LAB — AMMONIA: Ammonia: 10 umol/L (ref 9–35)

## 2023-10-28 LAB — LACTIC ACID, PLASMA: Lactic Acid, Venous: 1 mmol/L (ref 0.5–1.9)

## 2023-10-28 MED ORDER — SODIUM CHLORIDE 0.9% FLUSH
10.0000 mL | Freq: Two times a day (BID) | INTRAVENOUS | Status: DC
  Administered 2023-10-28 – 2023-11-05 (×15): 10 mL via INTRAVENOUS

## 2023-10-28 MED ORDER — ACETAMINOPHEN 325 MG PO TABS
650.0000 mg | ORAL_TABLET | Freq: Four times a day (QID) | ORAL | Status: DC | PRN
  Administered 2023-10-29 (×2): 650 mg via ORAL
  Filled 2023-10-28 (×3): qty 2

## 2023-10-28 MED ORDER — SODIUM CHLORIDE 0.9 % IV SOLN: INTRAVENOUS | Status: DC

## 2023-10-28 MED ORDER — OXYCODONE-ACETAMINOPHEN 5-325 MG PO TABS
1.0000 | ORAL_TABLET | Freq: Once | ORAL | Status: AC
  Administered 2023-10-28: 1 via ORAL
  Filled 2023-10-28: qty 1

## 2023-10-28 MED ORDER — VANCOMYCIN HCL IN DEXTROSE 1-5 GM/200ML-% IV SOLN: 1000.0000 mg | Freq: Once | INTRAVENOUS | Status: DC

## 2023-10-28 MED ORDER — SODIUM CHLORIDE 0.9 % IV SOLN
2.0000 g | Freq: Once | INTRAVENOUS | Status: AC
  Administered 2023-10-28: 2 g via INTRAVENOUS
  Filled 2023-10-28: qty 12.5

## 2023-10-28 MED ORDER — HEPARIN SODIUM (PORCINE) 5000 UNIT/ML IJ SOLN
5000.0000 [IU] | Freq: Three times a day (TID) | INTRAMUSCULAR | Status: DC
  Administered 2023-10-28 – 2023-10-31 (×8): 5000 [IU] via SUBCUTANEOUS
  Filled 2023-10-28 (×8): qty 1

## 2023-10-28 MED ORDER — INSULIN ASPART 100 UNIT/ML IJ SOLN
0.0000 [IU] | Freq: Every day | INTRAMUSCULAR | Status: DC
  Filled 2023-10-28: qty 0.05

## 2023-10-28 MED ORDER — ACETAMINOPHEN 650 MG RE SUPP: 650.0000 mg | Freq: Four times a day (QID) | RECTAL | Status: DC | PRN

## 2023-10-28 MED ORDER — INSULIN ASPART 100 UNIT/ML IJ SOLN
0.0000 [IU] | Freq: Three times a day (TID) | INTRAMUSCULAR | Status: DC
  Administered 2023-10-29 – 2023-11-01 (×7): 1 [IU] via SUBCUTANEOUS
  Administered 2023-11-01 – 2023-11-02 (×2): 2 [IU] via SUBCUTANEOUS
  Administered 2023-11-03 – 2023-11-04 (×6): 1 [IU] via SUBCUTANEOUS
  Filled 2023-10-28: qty 0.09

## 2023-10-28 MED ORDER — ENOXAPARIN SODIUM 40 MG/0.4ML IJ SOSY
40.0000 mg | PREFILLED_SYRINGE | INTRAMUSCULAR | Status: DC
Start: 2023-10-28 — End: 2023-10-28

## 2023-10-28 MED ORDER — METRONIDAZOLE 500 MG/100ML IV SOLN
500.0000 mg | Freq: Once | INTRAVENOUS | Status: AC
  Administered 2023-10-28: 500 mg via INTRAVENOUS
  Filled 2023-10-28: qty 100

## 2023-10-28 MED ORDER — VANCOMYCIN HCL 1500 MG/300ML IV SOLN
1500.0000 mg | Freq: Once | INTRAVENOUS | Status: AC
  Administered 2023-10-28: 1500 mg via INTRAVENOUS
  Filled 2023-10-28: qty 300

## 2023-10-28 MED ORDER — SODIUM CHLORIDE 0.9 % IV BOLUS
500.0000 mL | Freq: Once | INTRAVENOUS | Status: AC
  Administered 2023-10-28: 500 mL via INTRAVENOUS

## 2023-10-28 NOTE — H&P (Signed)
History and Physical    [name]:[id number] DOB: 01/27/1934 DOA: 10/28/2023  PCP: [name], MD  Patient coming from: [facility]  Chief Complaint: Hypotension, AMS  HPI: [name] is a 89 y.o. male with medical history significant of prostate cancer status post radiation, CKD stage II-IIIa, renal cell carcinoma status post nephrectomy, bladder cancer, hypertension, hyperlipidemia, CAD status post CABG, type 2 diabetes, chronic HFrEF, history of TAVR, chronic back pain on opiate, gout, GERD.  He was admitted to the hospital last month 10/3-10/10 for surgical wound infection of his back with cellulitis and also UTI.  He was also treated with midodrine for hypotension and it was discontinued prior to discharge.  Patient presents to the ED today via EMS due to transient hypotension which resolved after 500 mL fluid bolus was given.  Patient complained of dysuria and generalized weakness x 1 week.  Family reported patient having confusion for the past few days and had a near syncopal episode yesterday.  They reported patient having decreased p.o. intake for several days.  Patient was given additional 500 mL IV fluids in the ED and remained normotensive.  Borderline tachycardic but afebrile.  Not tachypneic or hypoxic.  Labs notable for WBC count 10.0, hemoglobin 8.4 (baseline 9-10), MCV 98.2, platelet count 328k, sodium 136, potassium 3.6, chloride 101, bicarb 26, BUN 32, creatinine 1.5 (baseline 1.0-1.2), glucose 147, normal LFTs, lactic acid 1.0, COVID/influenza/RSV PCR negative, blood cultures collected, INR 1.1.  UA with negative nitrite, trace leukocytes, and microscopy showing 11-20 WBCs and no bacteria.  Urine culture pending. Lungs clear on chest x-ray.  CT head negative for acute intracranial process. Patient was given Percocet, vancomycin, cefepime, and metronidazole.  TRH called to admit.  Patient is quite confused.  He is oriented to self and able to recognize his wife at bedside  but otherwise not able to give any history.  Wife states patient was sent to a rehab facility after his hospitalization a month ago.  She has been visiting him every day and recently noticed patient having waxing and waning confusion.  Also she is concerned that he has not been eating or drinking much.  She is not able to give any additional information.  Review of Systems:  Review of Systems  Reason unable to perform ROS: AMS.    Past Medical History:  Diagnosis Date   Anemia due to chronic kidney disease    Benign localized prostatic hyperplasia with lower urinary tract symptoms (LUTS)    Bladder cancer (HCC) 04/2023   dr dahlstedt   Carpal tunnel syndrome on both sides    Chronic constipation    Chronic gout without tophus    followed by pcp    (05-07-2023  per pt last flare-up 6-8 months ago, usually affects bilateral great toes)   Chronic low back pain    per pt s/p RFA's   Chronic narcotic use    Chronic systolic CHF (congestive heart failure) (HCC)    followed by cardiology;  preserved ef   CKD (chronic kidney disease), stage III (HCC) 2017   previously seen by nephrologsit--- dr Hyman Hopes   Coronary artery disease 1993   cardiologist--- dr h. Katrinka Blazing;   1993-- s/p cath w/ PTCA to occluded RCA;  a. 2007: s/p CABG x4 (LIMA--> LAD, rSVG--> RI, rSVG-seq -->dRCA and PLA))  c. 2017: LHC with 2/3 patent bypass grafts with occuluded SVG--> RI   DDD (degenerative disc disease), lumbosacral    GERD (gastroesophageal reflux disease)  History of adenomatous polyp of colon    History of cardiomyopathy 09/2016   in setting severe aortic stenosis and new acute on chronic systolic CHF, ef 54-09%   History of DVT of lower extremity 2008   superficial thrombophlebitis-- R Lower leg, right upper leg   History of polymyalgia rheumatica 2011   per pt hx was treated and resolved after being dx approx 2011   History of prostate cancer 2009   urologist--- dr Retta Diones;   dx 2009,  Gleason 4+3;    02-08-2009  s/p radioactive prostate seed implants by dr Vonita Moss  (05-07-2023  PSA undetectable)   History of renal cell cancer 2014   primary urologist-- dr Frederich Cha;   incidental finding on imaging for back , left renal mass;   08-31-2013  s/p partial left nephrecotmy   History of septic shock 2016   post op  lumbar back surgery, positive blood culture  (discitis/ ostomyelitis)   Hyperlipidemia    Hypertension    Peripheral neuropathy    hands and feet   Pulmonary nodule, right    solitary ,  last Chest CT in epic 12-08-2018 stable   S/P aortic valve replacement with prosthetic valve 07/02/2017   @[facility]  by Dr Laneta Simmers w/ Stephani Police prosthesis  for severe aortic valve stenosis  (nonrheumatic)   Secondary hyperparathyroidism of renal origin (HCC)    Type 2 diabetes mellitus (HCC)    followed by pcp;   (05-07-2023  per pt only check blood sugar occasionlly)   Wears partial dentures    upper    Past Surgical History:  Procedure Laterality Date   APPENDECTOMY     child   CARDIAC CATHETERIZATION N/A 11/03/2016   Procedure: Right/Left Heart Cath and Coronary/Graft Angiography;  Surgeon: Lyn Records, MD;  Location: [facility] INVASIVE CV LAB;  Service: Cardiovascular;  Laterality: N/A;   CARDIAC CATHETERIZATION  03/30/2006   @[facility]  by dr h. Katrinka Blazing;   severe coronary artery disease   CATARACT EXTRACTION W/ INTRAOCULAR LENS IMPLANT Bilateral    2015/ 2019   CORONARY ANGIOPLASTY  1993   @[facility]  by dr h. Katrinka Blazing;   PTCA to total occluded RCA   CORONARY ARTERY BYPASS GRAFT  03/31/2006   @[facility]  by dr gerhardt;   x4--  LIMA--LAD/  rSVG--RI/   rSVG seq -- dRCA and PLB of RCA   INSERTION PROSTATE RADIATION SEED  02/08/2009   @[facility]  by dr Vonita Moss;   radioactive prostate seed implants for prostate cancer   LEFT HEART CATH AND CORS/GRAFTS ANGIOGRAPHY N/A 09/01/2023   Procedure: LEFT HEART CATH AND CORS/GRAFTS ANGIOGRAPHY;  Surgeon: Corky Crafts, MD;  Location: [facility] INVASIVE CV LAB;  Service: Cardiovascular;   Laterality: N/A;   LUMBAR SPINE SURGERY  10/02/2015   dr Dutch Quint;   L3--L5   ROBOTIC ASSITED PARTIAL NEPHRECTOMY Left 08/31/2013   Procedure: ROBOTIC ASSITED PARTIAL NEPHRECTOMY;  Surgeon: Crecencio Mc, MD;  Location: [facility] ORS;  Service: Urology;  Laterality: Left;   TEE WITHOUT CARDIOVERSION N/A 06/22/2017   Procedure: TRANSESOPHAGEAL ECHOCARDIOGRAM (TEE);  Surgeon: Tonny Bollman, MD;  Location: [facility] OR;  Service: Open Heart Surgery;  Laterality: N/A;   TRANSCATHETER AORTIC VALVE REPLACEMENT, TRANSFEMORAL N/A 06/22/2017   Procedure: TRANSCATHETER AORTIC VALVE REPLACEMENT, TRANSFEMORAL;  Surgeon: Tonny Bollman, MD;  Location: [facility] OR;  Service: Open Heart Surgery;  Laterality: N/A;   VEIN LIGATION AND STRIPPING Right 1972   right lower leg     reports that he quit smoking about 42 years ago. His smoking use  included cigarettes. He started smoking about 72 years ago. He has a 90 pack-year smoking history. He has never used smokeless tobacco. He reports that he does not drink alcohol and does not use drugs.  Allergies  Allergen Reactions   Fish Allergy Swelling    Dark fish- salmon, tuna    Lipitor [Atorvastatin] Other (See Comments)    Muscle pain   Penicillins Swelling    SWELLING OF THE FEET  Has patient had a PCN reaction causing immediate rash, facial/tongue/throat swelling, SOB or lightheadedness with hypotension: no Has patient had a PCN reaction causing severe rash involving mucus membranes or skin necrosis: no Has patient had a PCN reaction that required hospitalization no Has patient had a PCN reaction occurring within the last 10 years: no If all of the above answers are "NO", then may proceed with Cephalosporin use.   Simvastatin Other (See Comments)    MUSCLE PAIN   Adacel [Tetanus-Diphth-Acell Pertussis]     UNSPECIFIED REACTION    Lasix [Furosemide] Swelling    SWELLING REACTION UNSPECIFIED    Septra [Sulfamethoxazole-Trimethoprim]     UNSPECIFIED REACTION    Flexeril  [Cyclobenzaprine] Other (See Comments)    Causes confusion   Pantoprazole Diarrhea    Family History  Problem Relation Age of Onset   Heart Problems Mother    Heart Problems Father    Neuropathy Neg Hx     Prior to Admission medications   Medication Sig Start Date End Date Taking? Authorizing Provider  aspirin 81 MG chewable tablet Chew 81 mg by mouth every evening.    [provider]  Ca Carbonate-Mag Hydroxide (ROLAIDS PO) Take by mouth as needed (reflux).    [provider]  Cholecalciferol (VITAMIN D-3) 125 MCG (5000 UT) TABS Take 5,000 Units by mouth daily.    [provider]  Ensure Max Protein (ENSURE MAX PROTEIN) LIQD Take 330 mLs (11 oz total) by mouth daily. 10/01/23   Narda Bonds, MD  glipiZIDE (GLUCOTROL) 10 MG tablet Take 0.5 tablets (5 mg total) by mouth 2 (two) times daily before a meal. 10/02/23   Narda Bonds, MD  HYDROcodone-acetaminophen (NORCO) 10-325 MG tablet Take 1 tablet by mouth every 4 (four) hours as needed. Per pt on regular basis for chronic pain for severe back pain 09/30/23   Narda Bonds, MD  lidocaine (LIDODERM) 5 % Place 1 patch onto the skin as needed. Remove & Discard patch within 12 hours or as directed by MD    [provider]  melatonin 5 MG TABS Take 1 tablet (5 mg total) by mouth at bedtime. 09/30/23   Narda Bonds, MD  metoprolol succinate (TOPROL XL) 25 MG 24 hr tablet Take 1 tablet (25 mg total) by mouth daily. Hold for blood pressure less than 100/60 mmHg 09/30/23   Narda Bonds, MD  Multiple Vitamin (MULTIVITAMIN) tablet Take 1 tablet by mouth at bedtime.    [provider]  nitroGLYCERIN (NITROSTAT) 0.4 MG SL tablet Place 1 tablet (0.4 mg total) under the tongue every 5 (five) minutes as needed for chest pain. 08/26/23   Christell Constant, MD  nutrition supplement, JUVEN, (JUVEN) PACK Take 1 packet by mouth 2 (two) times daily between meals. 09/30/23   Narda Bonds, MD  OVER  THE COUNTER MEDICATION Apply topically 4 (four) times daily as needed. Neuro Soothe Cream for nerve pain , hands/ feet    [provider]  OVER THE COUNTER MEDICATION Apply 1  application  topically daily as needed (Back pain). CBD Roll on    [provider]  OVER THE COUNTER MEDICATION Take 8 oz by mouth 2 (two) times daily. Blueprint to Nutrition Mix   Vaspro Plus, Greenberry and Inflamm with water and crystal light packets    [provider]  OVER THE COUNTER MEDICATION Take 1 tablet by mouth at bedtime as needed (Sleep). Relaxium sleep    [provider]  polyethylene glycol (MIRALAX / GLYCOLAX) 17 g packet Take 17 g by mouth daily as needed for mild constipation. 09/30/23   Narda Bonds, MD  Propylene Glycol (SYSTANE COMPLETE) 0.6 % SOLN Place 1 drop into the left eye 2 (two) times daily.    [provider]  psyllium (HYDROCIL/METAMUCIL) 95 % PACK Take 1 packet by mouth daily as needed for mild constipation.    [provider]  tamsulosin (FLOMAX) 0.4 MG CAPS capsule Take 0.4 mg by mouth 2 (two) times daily.    [provider]  torsemide (DEMADEX) 20 MG tablet TAKE 1 TABLET BY MOUTH EVERY MONDAY, WEDNESDAY, AND FRIDAY Patient taking differently: Take 20 mg by mouth daily as needed. 11/30/22   Lyn Records, MD    Physical Exam: Vitals:   10/28/23 1635 10/28/23 1715 10/28/23 1800 10/28/23 1913  BP:  128/68 119/71 114/67  Pulse:  82 89 (!) 101  Resp:  (!) 23 15 15   Temp: 97.7 F (36.5 C)   98.7 F (37.1 C)  TempSrc: Oral   Oral  SpO2:  98% 98% 95%  Weight:        Physical Exam Vitals reviewed.  Constitutional:      General: He is not in acute distress. HENT:     Head: Normocephalic and atraumatic.     Mouth/Throat:     Mouth: Mucous membranes are dry.  Eyes:     Extraocular Movements: Extraocular movements intact.  Cardiovascular:     Rate and Rhythm: Normal rate and regular rhythm.     Pulses: Normal pulses.   Pulmonary:     Effort: Pulmonary effort is normal. No respiratory distress.     Breath sounds: Normal breath sounds. No wheezing or rales.  Abdominal:     General: Bowel sounds are normal. There is no distension.     Palpations: Abdomen is soft.     Tenderness: There is no abdominal tenderness. There is no guarding.  Musculoskeletal:     Cervical back: Normal range of motion. No rigidity.     Right lower leg: No edema.     Left lower leg: No edema.     Comments: No obvious signs of infection at his lower back surgical wound site  Skin:    General: Skin is warm and dry.  Neurological:     General: No focal deficit present.     Mental Status: He is alert.     Cranial Nerves: No cranial nerve deficit.     Sensory: No sensory deficit.     Motor: No weakness.     Labs on Admission: I have personally reviewed following labs and imaging studies  CBC: Recent Labs  Lab 10/28/23 1227  WBC 10.0  NEUTROABS 6.6  HGB 8.4*  HCT 27.3*  MCV 98.2  PLT 328   Basic Metabolic Panel: Recent Labs  Lab 10/28/23 1227  NA 136  K 3.6  CL 101  CO2 26  GLUCOSE 147*  BUN 32*  CREATININE 1.56*  CALCIUM 10.3   GFR:  Estimated Creatinine Clearance: 30.7 mL/min (A) (by C-G formula based on SCr of 1.56 mg/dL (H)). Liver Function Tests: Recent Labs  Lab 10/28/23 1227  AST 16  ALT 8  ALKPHOS 49  BILITOT 0.4  PROT 6.6  ALBUMIN 2.4*   No results for input(s): "LIPASE", "AMYLASE" in the last 168 hours. No results for input(s): "AMMONIA" in the last 168 hours. Coagulation Profile: Recent Labs  Lab 10/28/23 1300  INR 1.1   Cardiac Enzymes: No results for input(s): "CKTOTAL", "CKMB", "CKMBINDEX", "TROPONINI" in the last 168 hours. BNP (last 3 results) No results for input(s): "PROBNP" in the last 8760 hours. HbA1C: No results for input(s): "HGBA1C" in the last 72 hours. CBG: No results for input(s): "GLUCAP" in the last 168 hours. Lipid Profile: No results for input(s): "CHOL",  "HDL", "LDLCALC", "TRIG", "CHOLHDL", "LDLDIRECT" in the last 72 hours. Thyroid Function Tests: No results for input(s): "TSH", "T4TOTAL", "FREET4", "T3FREE", "THYROIDAB" in the last 72 hours. Anemia Panel: No results for input(s): "VITAMINB12", "FOLATE", "FERRITIN", "TIBC", "IRON", "RETICCTPCT" in the last 72 hours. Urine analysis:    Component Value Date/Time   COLORURINE YELLOW 10/28/2023 1439   APPEARANCEUR CLEAR 10/28/2023 1439   LABSPEC 1.015 10/28/2023 1439   PHURINE 5.0 10/28/2023 1439   GLUCOSEU NEGATIVE 10/28/2023 1439   HGBUR NEGATIVE 10/28/2023 1439   BILIRUBINUR NEGATIVE 10/28/2023 1439   KETONESUR NEGATIVE 10/28/2023 1439   PROTEINUR 30 (A) 10/28/2023 1439   UROBILINOGEN 1.0 10/26/2015 0115   NITRITE NEGATIVE 10/28/2023 1439   LEUKOCYTESUR TRACE (A) 10/28/2023 1439    Radiological Exams on Admission: CT Head Wo Contrast  Result Date: 10/28/2023 CLINICAL DATA:  Hypotension, weakness EXAM: CT HEAD WITHOUT CONTRAST TECHNIQUE: Contiguous axial images were obtained from the base of the skull through the vertex without intravenous contrast. RADIATION DOSE REDUCTION: This exam was performed according to the departmental dose-optimization program which includes automated exposure control, adjustment of the mA and/or kV according to patient size and/or use of iterative reconstruction technique. COMPARISON:  09/28/2023 FINDINGS: Brain: No evidence of acute infarction, hemorrhage, mass, mass effect, or midline shift. No hydrocephalus or extra-axial fluid collection. Age related cerebral atrophy Vascular: No hyperdense vessel. Atherosclerotic calcifications in the intracranial carotid and vertebral arteries. Skull: Negative for fracture or focal lesion. Sinuses/Orbits: No acute finding. Other: The mastoid air cells are well aerated. IMPRESSION: No acute intracranial process. Electronically Signed   By: Wiliam Ke M.D.   On: 10/28/2023 19:36   DG Chest Port 1 View  Result Date:  10/28/2023 CLINICAL DATA:  Possible sepsis.  Hypotension. EXAM: PORTABLE CHEST 1 VIEW COMPARISON:  September 23, 2023. FINDINGS: Stable cardiomediastinal silhouette. 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 Hypoinflation of the lungs is noted. Lungs are clear. Bony thorax is unremarkable. IMPRESSION: Hypoinflation of the lungs.  No acute abnormality seen. Electronically Signed   By: Lupita Raider M.D.   On: 10/28/2023 14:46    EKG: Independently reviewed.  Sinus rhythm, LBBB, supraventricular bigeminy.  LBBB is not new.  Assessment and Plan  AKI on CKD stage II-IIIa Likely due to dehydration in the setting of poor p.o. intake.  BUN 32, creatinine 1.5 (baseline 1.0-1.2). Continue gentle IV fluid hydration and monitor labs.  Avoid nephrotoxic agents/hold home torsemide.  Acute metabolic encephalopathy Likely due to dehydration and opioid use for chronic back pain.   Patient was given broad-spectrum antibiotics in the ED and no obvious infectious source at this time.  No fever, leukocytosis, or meningeal signs.  Chest x-ray not suggestive of pneumonia. UA not strongly suggestive of infection.  Urine culture pending.  No obvious signs of infection at his lower back surgical wound site at this time.  COVID/influenza/RSV PCR negative.  CT head negative for acute intracranial abnormality and no focal neurodeficit on exam.  TSH, B12 level, and B1 level were normal on labs done a month ago.  Check ammonia level.  Continue gentle IV fluid hydration and avoid opiates.  Consider brain MRI if he continues to have persistent encephalopathy.  Transient hypotension Patient has remained normotensive after receiving a total of 1 L IV fluids.  No obvious infectious source at this time and no signs of sepsis.  Hold home antihypertensives at this time and monitor blood pressure closely.  Near syncope Per ED report, family had reported patient having a near syncopal episode yesterday.   This is likely due to dehydration and hypotension.  CT head negative for acute intracranial abnormality and no focal neurodeficit on exam.  Patient is not endorsing chest pain and EKG without acute ischemic changes.  No dyspnea or hypoxia to suggest PE.  Generalized weakness/physical deconditioning PT/OT eval, fall precautions  Hypertension Hold metoprolol and torsemide at this time.  Type 2 diabetes A1c 6.2 in June 2018, repeat ordered.  Sensitive sliding scale insulin ACHS.  Hold glipizide at this time.  Chronic HFrEF Echo done 08/05/2023 showing EF 30%, grade 1 diastolic dysfunction, RV systolic function moderately reduced, mild mitral valve regurgitation, trivial aortic valve regurgitation.  No signs of volume overload at this time.  Hold diuretic given concern for dehydration/AKI.  Check BNP.  Chronic back pain Tylenol as needed for pain.  Avoid opiates.  Normocytic anemia Hemoglobin currently 8.4 and baseline appears to be 9-10.  No signs of bleeding.  Continue to monitor CBC.  CAD status post CABG Chronic gout GERD Hyperlipidemia Pharmacy med rec pending.  DVT prophylaxis: SQ Heparin Code Status: Full Code (discussed with the patient's wife) Family Communication: Wife at bedside. Level of care: Telemetry bed Admission status: It is my clinical opinion that referral for OBSERVATION is reasonable and necessary in this patient based on the above information provided. The aforementioned taken together are felt to place the patient at high risk for further clinical deterioration. However, it is anticipated that the patient may be medically stable for discharge from the hospital within 24 to 48 hours.  John Giovanni MD [facility]  If 7PM-7AM, please contact night-coverage [email or web address]  10/28/2023, 7:57 PM

## 2023-10-28 NOTE — Sepsis Progress Note (Signed)
Elink monitoring for the code sepsis protocol.  

## 2023-10-28 NOTE — Progress Notes (Signed)
A consult was received from an ED physician for Cefepime, Vancomycin per pharmacy dosing.  The patient's profile has been reviewed for ht/wt/allergies/indication/available labs.   A one time order has been placed for Cefepime 2g, Vancomycin 1500 mg.    Further antibiotics/pharmacy consults should be ordered by admitting physician if indicated.                       Thank you,  Lynann Beaver PharmD, BCPS [facility] main pharmacy [phone] 10/28/2023 12:40 PM

## 2023-10-28 NOTE — ED Notes (Signed)
ED TO INPATIENT HANDOFF REPORT  ED Nurse Name and Phone #: Gypsy Balsam [phone]  S Name/Age/Gender Remus Loffler 89 y.o. male Room/Bed: WA09/WA09  Code Status   Code Status: Prior  Home/SNF/Other Skilled nursing facility Patient oriented to: self Is this baseline? Yes   Triage Complete: Triage complete  Chief Complaint AMS (altered mental status) [R41.82]  Triage Note [facility] c/o hypotension, Resolved with 500 ml bolus from [facility]. Also c/o dysuria, generalized weakness x 1 week.. Back surgery recently   Allergies Allergies  Allergen Reactions   Fish Allergy Swelling    Dark fish- salmon, tuna    Lipitor [Atorvastatin] Other (See Comments)    Muscle pain   Penicillins Swelling    SWELLING OF THE FEET  Has patient had a PCN reaction causing immediate rash, facial/tongue/throat swelling, SOB or lightheadedness with hypotension: no Has patient had a PCN reaction causing severe rash involving mucus membranes or skin necrosis: no Has patient had a PCN reaction that required hospitalization no Has patient had a PCN reaction occurring within the last 10 years: no If all of the above answers are "NO", then may proceed with Cephalosporin use.   Simvastatin Other (See Comments)    MUSCLE PAIN   Adacel [Tetanus-Diphth-Acell Pertussis]     UNSPECIFIED REACTION    Lasix [Furosemide] Swelling    SWELLING REACTION UNSPECIFIED    Septra [Sulfamethoxazole-Trimethoprim]     UNSPECIFIED REACTION    Flexeril [Cyclobenzaprine] Other (See Comments)    Causes confusion   Pantoprazole Diarrhea    Level of Care/Admitting Diagnosis ED Disposition     ED Disposition  Admit   Condition  --   Comment  Hospital Area: [facility] [[id number]]  Level of Care: Telemetry [5]  Admit to tele based on following criteria: Complex arrhythmia (Bradycardia/Tachycardia)  May place patient in observation at [facility] or [facility] if equivalent level of care is available::  Yes  Covid Evaluation: Asymptomatic - no recent exposure (last 10 days) testing not required  Diagnosis: AMS (altered mental status) [[id number]]  Admitting Physician: John Giovanni [[id number]]  Attending Physician: John Giovanni [[id number]]          B Medical/Surgery History Past Medical History:  Diagnosis Date   Anemia due to chronic kidney disease    Benign localized prostatic hyperplasia with lower urinary tract symptoms (LUTS)    Bladder cancer (HCC) 04/2023   dr dahlstedt   Carpal tunnel syndrome on both sides    Chronic constipation    Chronic gout without tophus    followed by pcp    (05-07-2023  per pt last flare-up 6-8 months ago, usually affects bilateral great toes)   Chronic low back pain    per pt s/p RFA's   Chronic narcotic use    Chronic systolic CHF (congestive heart failure) (HCC)    followed by cardiology;  preserved ef   CKD (chronic kidney disease), stage III (HCC) 2017   previously seen by nephrologsit--- dr Hyman Hopes   Coronary artery disease 1993   cardiologist--- dr h. Katrinka Blazing;   1993-- s/p cath w/ PTCA to occluded RCA;  a. 2007: s/p CABG x4 (LIMA--> LAD, rSVG--> RI, rSVG-seq -->dRCA and PLA))  c. 2017: LHC with 2/3 patent bypass grafts with occuluded SVG--> RI   DDD (degenerative disc disease), lumbosacral    GERD (gastroesophageal reflux disease)    History of adenomatous polyp of colon    History of cardiomyopathy 09/2016   in setting severe aortic stenosis and  new acute on chronic systolic CHF, ef 16-10%   History of DVT of lower extremity 2008   superficial thrombophlebitis-- R Lower leg, right upper leg   History of polymyalgia rheumatica 2011   per pt hx was treated and resolved after being dx approx 2011   History of prostate cancer 2009   urologist--- dr Retta Diones;   dx 2009,  Gleason 4+3;   02-08-2009  s/p radioactive prostate seed implants by dr Vonita Moss  (05-07-2023  PSA undetectable)   History of renal cell cancer 2014   primary  urologist-- dr Frederich Cha;   incidental finding on imaging for back , left renal mass;   08-31-2013  s/p partial left nephrecotmy   History of septic shock 2016   post op  lumbar back surgery, positive blood culture  (discitis/ ostomyelitis)   Hyperlipidemia    Hypertension    Peripheral neuropathy    hands and feet   Pulmonary nodule, right    solitary ,  last Chest CT in epic 12-08-2018 stable   S/P aortic valve replacement with prosthetic valve 07/02/2017   @[facility]  by Dr Laneta Simmers w/ Stephani Police prosthesis  for severe aortic valve stenosis  (nonrheumatic)   Secondary hyperparathyroidism of renal origin (HCC)    Type 2 diabetes mellitus (HCC)    followed by pcp;   (05-07-2023  per pt only check blood sugar occasionlly)   Wears partial dentures    upper   Past Surgical History:  Procedure Laterality Date   APPENDECTOMY     child   CARDIAC CATHETERIZATION N/A 11/03/2016   Procedure: Right/Left Heart Cath and Coronary/Graft Angiography;  Surgeon: Lyn Records, MD;  Location: [facility] INVASIVE CV LAB;  Service: Cardiovascular;  Laterality: N/A;   CARDIAC CATHETERIZATION  03/30/2006   @[facility]  by dr h. Katrinka Blazing;   severe coronary artery disease   CATARACT EXTRACTION W/ INTRAOCULAR LENS IMPLANT Bilateral    2015/ 2019   CORONARY ANGIOPLASTY  1993   @[facility]  by dr h. Katrinka Blazing;   PTCA to total occluded RCA   CORONARY ARTERY BYPASS GRAFT  03/31/2006   @[facility]  by dr gerhardt;   x4--  LIMA--LAD/  rSVG--RI/   rSVG seq -- dRCA and PLB of RCA   INSERTION PROSTATE RADIATION SEED  02/08/2009   @[facility]  by dr Vonita Moss;   radioactive prostate seed implants for prostate cancer   LEFT HEART CATH AND CORS/GRAFTS ANGIOGRAPHY N/A 09/01/2023   Procedure: LEFT HEART CATH AND CORS/GRAFTS ANGIOGRAPHY;  Surgeon: Corky Crafts, MD;  Location: [facility] INVASIVE CV LAB;  Service: Cardiovascular;  Laterality: N/A;   LUMBAR SPINE SURGERY  10/02/2015   dr Dutch Quint;   L3--L5   ROBOTIC ASSITED PARTIAL NEPHRECTOMY Left 08/31/2013   Procedure:  ROBOTIC ASSITED PARTIAL NEPHRECTOMY;  Surgeon: Crecencio Mc, MD;  Location: [facility] ORS;  Service: Urology;  Laterality: Left;   TEE WITHOUT CARDIOVERSION N/A 06/22/2017   Procedure: TRANSESOPHAGEAL ECHOCARDIOGRAM (TEE);  Surgeon: Tonny Bollman, MD;  Location: [facility] OR;  Service: Open Heart Surgery;  Laterality: N/A;   TRANSCATHETER AORTIC VALVE REPLACEMENT, TRANSFEMORAL N/A 06/22/2017   Procedure: TRANSCATHETER AORTIC VALVE REPLACEMENT, TRANSFEMORAL;  Surgeon: Tonny Bollman, MD;  Location: [facility] OR;  Service: Open Heart Surgery;  Laterality: N/A;   VEIN LIGATION AND STRIPPING Right 1972   right lower leg     A IV Location/Drains/Wounds Patient Lines/Drains/Airways Status     Active Line/Drains/Airways     Name Placement date Placement time Site Days   Peripheral IV 10/28/23 20 G Anterior;Distal;Left;Upper Arm 10/28/23  1306  Arm  less than 1   External Urinary Catheter 10/28/23  1422  --  less than 1   Incision - 5 Ports Abdomen 1: Umbilicus;Superior 2: Left;Medial;Upper 3: Left;Medial 4: Left;Mid;Lateral 5: Left;Lateral;Lower 08/31/13  0810  -- 3710   Wound / Incision (Open or Dehisced) 09/24/23 Incision - Open Vertebral column Medial 09/24/23  0230  Vertebral column  34            Intake/Output Last 24 hours  Intake/Output Summary (Last 24 hours) at 10/28/2023 2032 Last data filed at 10/28/2023 1900 Gross per 24 hour  Intake 1000 ml  Output --  Net 1000 ml    Labs/Imaging Results for orders placed or performed during the hospital encounter of 10/28/23 (from the past 48 hour(s))  CBC with Differential/Platelet     Status: Abnormal   Collection Time: 10/28/23 12:27 PM  Result Value Ref Range   WBC 10.0 4.0 - 10.5 K/uL   RBC 2.78 (L) 4.22 - 5.81 MIL/uL   Hemoglobin 8.4 (L) 13.0 - 17.0 g/dL   HCT 91.4 (L) 78.2 - 95.6 %   MCV 98.2 80.0 - 100.0 fL   MCH 30.2 26.0 - 34.0 pg   MCHC 30.8 30.0 - 36.0 g/dL   RDW 21.3 08.6 - 57.8 %   Platelets 328 150 - 400 K/uL   nRBC 0.0 0.0 - 0.2 %    Neutrophils Relative % 66 %   Neutro Abs 6.6 1.7 - 7.7 K/uL   Lymphocytes Relative 22 %   Lymphs Abs 2.2 0.7 - 4.0 K/uL   Monocytes Relative 8 %   Monocytes Absolute 0.8 0.1 - 1.0 K/uL   Eosinophils Relative 3 %   Eosinophils Absolute 0.3 0.0 - 0.5 K/uL   Basophils Relative 1 %   Basophils Absolute 0.1 0.0 - 0.1 K/uL   Immature Granulocytes 0 %   Abs Immature Granulocytes 0.03 0.00 - 0.07 K/uL    Comment: Performed at [facility], [address], [place], [place] [address]  Comprehensive metabolic panel     Status: Abnormal   Collection Time: 10/28/23 12:27 PM  Result Value Ref Range   Sodium 136 135 - 145 mmol/L   Potassium 3.6 3.5 - 5.1 mmol/L   Chloride 101 98 - 111 mmol/L   CO2 26 22 - 32 mmol/L   Glucose, Bld 147 (H) 70 - 99 mg/dL    Comment: Glucose reference range applies only to samples taken after fasting for at least 8 hours.   BUN 32 (H) 8 - 23 mg/dL   Creatinine, Ser 9.52 (H) 0.61 - 1.24 mg/dL   Calcium 84.1 8.9 - 32.4 mg/dL   Total Protein 6.6 6.5 - 8.1 g/dL   Albumin 2.4 (L) 3.5 - 5.0 g/dL   AST 16 15 - 41 U/L   ALT 8 0 - 44 U/L   Alkaline Phosphatase 49 38 - 126 U/L   Total Bilirubin 0.4 <1.2 mg/dL   GFR, Estimated 43 (L) >60 mL/min    Comment: (NOTE) Calculated using the CKD-EPI Creatinine Equation (2021)    Anion gap 9 5 - 15    Comment: Performed at [facility], [address], [place], [place] [address]  Lactic acid, plasma     Status: None   Collection Time: 10/28/23 12:27 PM  Result Value Ref Range   Lactic Acid, Venous 1.0 0.5 - 1.9 mmol/L    Comment: Performed at [facility], [address]  [place], [place] [address]  Resp panel by RT-PCR (RSV, Flu A&B, Covid) Anterior Nasal Swab     Status: None   Collection Time: 10/28/23 12:45 PM   Specimen: Anterior Nasal Swab  Result Value Ref Range   SARS Coronavirus 2 by RT PCR NEGATIVE NEGATIVE    Comment: (NOTE) SARS-CoV-2 target nucleic  acids are NOT DETECTED.  The SARS-CoV-2 RNA is generally detectable in upper respiratory specimens during the acute phase of infection. The lowest concentration of SARS-CoV-2 viral copies this assay can detect is 138 copies/mL. A negative result does not preclude SARS-Cov-2 infection and should not be used as the sole basis for treatment or other patient management decisions. A negative result may occur with  improper specimen collection/handling, submission of specimen other than nasopharyngeal swab, presence of viral mutation(s) within the areas targeted by this assay, and inadequate number of viral copies(<138 copies/mL). A negative result must be combined with clinical observations, patient history, and epidemiological information. The expected result is Negative.  Fact Sheet for Patients:  BloggerCourse.com  Fact Sheet for Healthcare Providers:  SeriousBroker.it  This test is no t yet approved or cleared by the [place] FDA and  has been authorized for detection and/or diagnosis of SARS-CoV-2 by FDA under an Emergency Use Authorization (EUA). This EUA will remain  in effect (meaning this test can be used) for the duration of the COVID-19 declaration under Section 564(b)(1) of the Act, 21 U.S.C.section 360bbb-3(b)(1), unless the authorization is terminated  or revoked sooner.       Influenza A by PCR NEGATIVE NEGATIVE   Influenza B by PCR NEGATIVE NEGATIVE    Comment: (NOTE) The Xpert Xpress SARS-CoV-2/FLU/RSV plus assay is intended as an aid in the diagnosis of influenza from Nasopharyngeal swab specimens and should not be used as a sole basis for treatment. Nasal washings and aspirates are unacceptable for Xpert Xpress SARS-CoV-2/FLU/RSV testing.  Fact Sheet for Patients: BloggerCourse.com  Fact Sheet for Healthcare Providers: SeriousBroker.it  This test is not  yet approved or cleared by the [place] FDA and has been authorized for detection and/or diagnosis of SARS-CoV-2 by FDA under an Emergency Use Authorization (EUA). This EUA will remain in effect (meaning this test can be used) for the duration of the COVID-19 declaration under Section 564(b)(1) of the Act, 21 U.S.C. section 360bbb-3(b)(1), unless the authorization is terminated or revoked.     Resp Syncytial Virus by PCR NEGATIVE NEGATIVE    Comment: (NOTE) Fact Sheet for Patients: BloggerCourse.com  Fact Sheet for Healthcare Providers: SeriousBroker.it  This test is not yet approved or cleared by the [place] FDA and has been authorized for detection and/or diagnosis of SARS-CoV-2 by FDA under an Emergency Use Authorization (EUA). This EUA will remain in effect (meaning this test can be used) for the duration of the COVID-19 declaration under Section 564(b)(1) of the Act, 21 U.S.C. section 360bbb-3(b)(1), unless the authorization is terminated or revoked.  Performed at [facility], [address], [place], [place] [address]   Protime-INR     Status: None   Collection Time: 10/28/23  1:00 PM  Result Value Ref Range   Prothrombin Time 14.8 11.4 - 15.2 seconds   INR 1.1 0.8 - 1.2    Comment: (NOTE) INR goal varies based on device and disease states. Performed at [facility], [address] [place], [place] [address]   APTT     Status: None   Collection Time: 10/28/23  1:00 PM  Result Value Ref Range   aPTT 35 24 - 36 seconds    Comment: Performed at [facility], [address], [place], [place] [address]  Urinalysis, w/ Reflex to Culture (Infection Suspected) -Urine, Clean Catch     Status: Abnormal   Collection Time: 10/28/23  2:39 PM  Result Value Ref Range   Specimen Source URINE, CLEAN CATCH    Color, Urine YELLOW YELLOW   APPearance CLEAR CLEAR    Specific Gravity, Urine 1.015 1.005 - 1.030   pH 5.0 5.0 - 8.0   Glucose, UA NEGATIVE NEGATIVE mg/dL   Hgb urine dipstick NEGATIVE NEGATIVE   Bilirubin Urine NEGATIVE NEGATIVE   Ketones, ur NEGATIVE NEGATIVE mg/dL   Protein, ur 30 (A) NEGATIVE mg/dL   Nitrite NEGATIVE NEGATIVE   Leukocytes,Ua TRACE (A) NEGATIVE   RBC / HPF 0-5 0 - 5 RBC/hpf   WBC, UA 11-20 0 - 5 WBC/hpf    Comment:        Reflex urine culture not performed if WBC <=10, OR if Squamous epithelial cells >5. If Squamous epithelial cells >5 suggest recollection.    Bacteria, UA NONE SEEN NONE SEEN   Squamous Epithelial / HPF 0-5 0 - 5 /HPF   Mucus PRESENT    Hyaline Casts, UA PRESENT    Ca Oxalate Crys, UA PRESENT     Comment: Performed at [facility], [address] [place], [place] [address]   CT Head Wo Contrast  Result Date: 10/28/2023 CLINICAL DATA:  Hypotension, weakness EXAM: CT HEAD WITHOUT CONTRAST TECHNIQUE: Contiguous axial images were obtained from the base of the skull through the vertex without intravenous contrast. RADIATION DOSE REDUCTION: This exam was performed according to the departmental dose-optimization program which includes automated exposure control, adjustment of the mA and/or kV according to patient size and/or use of iterative reconstruction technique. COMPARISON:  09/28/2023 FINDINGS: Brain: No evidence of acute infarction, hemorrhage, mass, mass effect, or midline shift. No hydrocephalus or extra-axial fluid collection. Age related cerebral atrophy Vascular: No hyperdense vessel. Atherosclerotic calcifications in the intracranial carotid and vertebral arteries. Skull: Negative for fracture or focal lesion. Sinuses/Orbits: No acute finding. Other: The mastoid air cells are well aerated. IMPRESSION: No acute intracranial process. Electronically Signed   By: Wiliam Ke M.D.   On: 10/28/2023 19:36   DG Chest Port 1 View  Result Date: 10/28/2023 CLINICAL DATA:  Possible  sepsis.  Hypotension. EXAM: PORTABLE CHEST 1 VIEW COMPARISON:  September 23, 2023. FINDINGS: Stable cardiomediastinal silhouette. 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 Hypoinflation of the lungs is noted. Lungs are clear. Bony thorax is unremarkable. IMPRESSION: Hypoinflation of the lungs.  No acute abnormality seen. Electronically Signed   By: Lupita Raider M.D.   On: 10/28/2023 14:46    Pending Labs Unresulted Labs (From admission, onward)     Start     Ordered   10/28/23 1439  Urine Culture  Once,   R        10/28/23 1439   10/28/23 1236  Blood Culture (routine x 2)  (Septic presentation on arrival (screening labs, nursing and treatment orders for obvious sepsis))  BLOOD CULTURE X 2,   STAT      10/28/23 1236            Vitals/Pain Today's Vitals   10/28/23 1635 10/28/23 1715 10/28/23 1800 10/28/23 1913  BP:  128/68 119/71 114/67  Pulse:  82 89 (!) 101  Resp:  (!)  23 15 15   Temp: 97.7 F (36.5 C)   98.7 F (37.1 C)  TempSrc: Oral   Oral  SpO2:  98% 98% 95%  Weight:      PainSc:    7     Isolation Precautions No active isolations  Medications Medications  sodium chloride flush (NS) 0.9 % injection 10 mL (10 mLs Intravenous Given 10/28/23 1307)  sodium chloride 0.9 % bolus 500 mL (0 mLs Intravenous Stopped 10/28/23 1352)  ceFEPIme (MAXIPIME) 2 g in sodium chloride 0.9 % 100 mL IVPB (0 g Intravenous Stopped 10/28/23 1513)  metroNIDAZOLE (FLAGYL) IVPB 500 mg (0 mg Intravenous Stopped 10/28/23 1406)  vancomycin (VANCOREADY) IVPB 1500 mg/300 mL (0 mg Intravenous Stopped 10/28/23 1900)  oxyCODONE-acetaminophen (PERCOCET/ROXICET) 5-325 MG per tablet 1 tablet (1 tablet Oral Given 10/28/23 1952)    Mobility walks with person assist    R Report given to: Rickards,Logann

## 2023-10-28 NOTE — ED Provider Notes (Signed)
[place] EMERGENCY DEPARTMENT AT [facility] Provider Note   CSN: [id number] Arrival date & time: 10/28/23  1202     History  Chief Complaint  Patient presents with   Hypotension    [name] is a 89 y.o. male with history of prostate cancer status post radiation, CKD stage IV, renal cell carcinoma status post nephrectomy, hypertension, hyperlipidemia, CAD status post CABG, diabetes mellitus type 2, chronic combined heart failure (08/05/23 LVEF 30%), TAVR, chronic back pain  who is [facility] c/o hypotension, Resolved with 500 ml bolus from [facility]. Also c/o dysuria, generalized weakness x 1 week. Patient provides the majority of his own history. He denies f/c, CP, SOB, abdominal or flank pain. Endorses pain at the site of his prior back surgery but states that it is not worse than normal.  Per chart review patient was recently admitted from 09/23/2023 to 09/30/2023 for sepsis secondary to UTI as well as a concern for surgical wound infection after back surgery.  Had metoprolol held as an outpatient secondary to hypotension and was started on midodrine while inpatient, which was then discontinued again. Nephew and wife at bedside states that patient has been "on another planet" and confused for a few days. No known head trauma/falls.   Past Medical History:  Diagnosis Date   Anemia due to chronic kidney disease    Benign localized prostatic hyperplasia with lower urinary tract symptoms (LUTS)    Bladder cancer (HCC) 04/2023   dr dahlstedt   Carpal tunnel syndrome on both sides    Chronic constipation    Chronic gout without tophus    followed by pcp    (05-07-2023  per pt last flare-up 6-8 months ago, usually affects bilateral great toes)   Chronic low back pain    per pt s/p RFA's   Chronic narcotic use    Chronic systolic CHF (congestive heart failure) (HCC)    followed by cardiology;  preserved ef   CKD (chronic kidney disease), stage III (HCC) 2017   previously  seen by nephrologsit--- dr Hyman Hopes   Coronary artery disease 1993   cardiologist--- dr h. Katrinka Blazing;   1993-- s/p cath w/ PTCA to occluded RCA;  a. 2007: s/p CABG x4 (LIMA--> LAD, rSVG--> RI, rSVG-seq -->dRCA and PLA))  c. 2017: LHC with 2/3 patent bypass grafts with occuluded SVG--> RI   DDD (degenerative disc disease), lumbosacral    GERD (gastroesophageal reflux disease)    History of adenomatous polyp of colon    History of cardiomyopathy 09/2016   in setting severe aortic stenosis and new acute on chronic systolic CHF, ef 62-70%   History of DVT of lower extremity 2008   superficial thrombophlebitis-- R Lower leg, right upper leg   History of polymyalgia rheumatica 2011   per pt hx was treated and resolved after being dx approx 2011   History of prostate cancer 2009   urologist--- dr Retta Diones;   dx 2009,  Gleason 4+3;   02-08-2009  s/p radioactive prostate seed implants by dr Vonita Moss  (05-07-2023  PSA undetectable)   History of renal cell cancer 2014   primary urologist-- dr Frederich Cha;   incidental finding on imaging for back , left renal mass;   08-31-2013  s/p partial left nephrecotmy   History of septic shock 2016   post op  lumbar back surgery, positive blood culture  (discitis/ ostomyelitis)   Hyperlipidemia    Hypertension    Peripheral neuropathy    hands and feet  Pulmonary nodule, right    solitary ,  last Chest CT in epic 12-08-2018 stable   S/P aortic valve replacement with prosthetic valve 07/02/2017   @[facility]  by Dr Laneta Simmers w/ [employer] prosthesis  for severe aortic valve stenosis  (nonrheumatic)   Secondary hyperparathyroidism of renal origin [facility])    Type 2 diabetes mellitus (HCC)    followed by pcp;   (05-07-2023  per pt only check blood sugar occasionlly)   Wears partial dentures    upper       Home Medications Prior to Admission medications   Medication Sig Start Date End Date Taking? Authorizing Provider  aspirin 81 MG chewable tablet Chew 81 mg by mouth every  evening.    [provider]  Ca Carbonate-Mag Hydroxide (ROLAIDS PO) Take by mouth as needed (reflux).    [provider]  Cholecalciferol (VITAMIN D-3) 125 MCG (5000 UT) TABS Take 5,000 Units by mouth daily.    [provider]  Ensure Max Protein (ENSURE MAX PROTEIN) LIQD Take 330 mLs (11 oz total) by mouth daily. 10/01/23   Narda Bonds, MD  glipiZIDE (GLUCOTROL) 10 MG tablet Take 0.5 tablets (5 mg total) by mouth 2 (two) times daily before a meal. 10/02/23   Narda Bonds, MD  HYDROcodone-acetaminophen (NORCO) 10-325 MG tablet Take 1 tablet by mouth every 4 (four) hours as needed. Per pt on regular basis for chronic pain for severe back pain 09/30/23   Narda Bonds, MD  lidocaine (LIDODERM) 5 % Place 1 patch onto the skin as needed. Remove & Discard patch within 12 hours or as directed by MD    [provider]  melatonin 5 MG TABS Take 1 tablet (5 mg total) by mouth at bedtime. 09/30/23   Narda Bonds, MD  metoprolol succinate (TOPROL XL) 25 MG 24 hr tablet Take 1 tablet (25 mg total) by mouth daily. Hold for blood pressure less than 100/60 mmHg 09/30/23   Narda Bonds, MD  Multiple Vitamin (MULTIVITAMIN) tablet Take 1 tablet by mouth at bedtime.    [provider]  nitroGLYCERIN (NITROSTAT) 0.4 MG SL tablet Place 1 tablet (0.4 mg total) under the tongue every 5 (five) minutes as needed for chest pain. 08/26/23   Christell Constant, MD  nutrition supplement, JUVEN, (JUVEN) PACK Take 1 packet by mouth 2 (two) times daily between meals. 09/30/23   Narda Bonds, MD  OVER THE COUNTER MEDICATION Apply topically 4 (four) times daily as needed. Neuro Soothe Cream for nerve pain , hands/ feet    [provider]  OVER THE COUNTER MEDICATION Apply 1 application  topically daily as needed (Back pain). CBD Roll on    [provider]  OVER THE COUNTER MEDICATION Take 8 oz by mouth 2 (two) times daily. Blueprint to Nutrition Mix    Vaspro Plus, Greenberry and Inflamm with water and crystal light packets    [provider]  OVER THE COUNTER MEDICATION Take 1 tablet by mouth at bedtime as needed (Sleep). Relaxium sleep    [provider]  polyethylene glycol (MIRALAX / GLYCOLAX) 17 g packet Take 17 g by mouth daily as needed for mild constipation. 09/30/23   Narda Bonds, MD  Propylene Glycol (SYSTANE COMPLETE) 0.6 % SOLN Place 1 drop into the left eye 2 (two) times daily.    [provider]  psyllium (HYDROCIL/METAMUCIL) 95 % PACK Take 1 packet by mouth daily as needed for mild constipation.  [provider]  tamsulosin (FLOMAX) 0.4 MG CAPS capsule Take 0.4 mg by mouth 2 (two) times daily.    [provider]  torsemide (DEMADEX) 20 MG tablet TAKE 1 TABLET BY MOUTH EVERY MONDAY, WEDNESDAY, AND FRIDAY Patient taking differently: Take 20 mg by mouth daily as needed. 11/30/22   Lyn Records, MD      Allergies    Fish allergy, Lipitor [atorvastatin], Penicillins, Simvastatin, Adacel [tetanus-diphth-acell pertussis], Lasix [furosemide], Septra [sulfamethoxazole-trimethoprim], Flexeril [cyclobenzaprine], and Pantoprazole    Review of Systems   Review of Systems A 10 point review of systems was performed and is negative unless otherwise reported in HPI.  Physical Exam Updated Vital Signs BP (!) 109/58 (BP Location: Right Arm)   Pulse 82   Temp 98 F (36.7 C) (Oral)   Resp 16   Wt 74 kg   SpO2 95%   BMI 25.94 kg/m  Physical Exam General: Chronically ill-appearing elderly male, lying in bed.  HEENT: PERRLA, EOMI, Sclera anicteric, dry mucous membranes, trachea midline. NCAT.  Cardiology: RRR, no murmurs/rubs/gallops. BL radial and DP pulses equal bilaterally.  Resp: Normal respiratory rate and effort. CTAB, no wheezes, rhonchi, crackles.  Abd: Soft, non-tender, non-distended. No rebound tenderness or guarding.  GU: Deferred. MSK: No peripheral edema or signs of  trauma. Extremities without deformity or TTP. No cyanosis or clubbing. Skin: warm, dry.  Back: No CVA tenderness. Surgical site at upper lumbar spine without signs of overt infection, no significant TTP of the area, no fluctuance or induration.  Neuro: A&Ox3, at times not to situation, CNs II-XII grossly intact. MAEs. Sensation grossly intact.   ED Results / Procedures / Treatments   Labs (all labs ordered are listed, but only abnormal results are displayed) Labs Reviewed  CBC WITH DIFFERENTIAL/PLATELET - Abnormal; Notable for the following components:      Result Value   RBC 2.78 (*)    Hemoglobin 8.4 (*)    HCT 27.3 (*)    All other components within normal limits  RESP PANEL BY RT-PCR (RSV, FLU A&B, COVID)  RVPGX2  CULTURE, BLOOD (ROUTINE X 2)  CULTURE, BLOOD (ROUTINE X 2)  COMPREHENSIVE METABOLIC PANEL  LACTIC ACID, PLASMA  URINALYSIS, W/ REFLEX TO CULTURE (INFECTION SUSPECTED)  PROTIME-INR  APTT  I-STAT CG4 LACTIC ACID, ED    EKG EKG Interpretation Date/Time:  Thursday October 28 2023 13:08:41 EST Ventricular Rate:  76 PR Interval:  187 QRS Duration:  173 QT Interval:  400 QTC Calculation: 450 R Axis:   -49  Text Interpretation: Sinus rhythm with premature atrial complexes in intermittent bigeminy pattern Left bundle branch block Simlar to prior EKGs Confirmed by Vivi Barrack [phone]) on 10/28/2023 2:04:18 PM  Radiology No results found.  Procedures .Critical Care  Performed by: Loetta Rough, MD Authorized by: Loetta Rough, MD   Critical care provider statement:    Critical care time (minutes):  30   Critical care was necessary to treat or prevent imminent or life-threatening deterioration of the following conditions:  Shock, sepsis and dehydration   Critical care was time spent personally by me on the following activities:  Development of treatment plan with patient or surrogate, discussions with consultants, evaluation of patient's response to treatment,  examination of patient, ordering and review of laboratory studies, ordering and review of radiographic studies, ordering and performing treatments and interventions, pulse oximetry, re-evaluation of patient's condition and review of old charts     Medications Ordered in ED Medications  sodium chloride  flush (NS) 0.9 % injection 10 mL (has no administration in time range)  ceFEPIme (MAXIPIME) 2 g in sodium chloride 0.9 % 100 mL IVPB (has no administration in time range)  metroNIDAZOLE (FLAGYL) IVPB 500 mg (has no administration in time range)  vancomycin (VANCOREADY) IVPB 1500 mg/300 mL (has no administration in time range)  sodium chloride 0.9 % bolus 500 mL (500 mLs Intravenous New Bag/Given 10/28/23 1306)    ED Course/ Medical Decision Making/ A&P                          Medical Decision Making Amount and/or Complexity of Data Reviewed Labs: ordered. Decision-making details documented in ED Course. Radiology: ordered. Decision-making details documented in ED Course.  Risk Prescription drug management. Decision regarding hospitalization.    This patient presents to the ED for concern of hypotension, urinary sxs, confusion, gen weakness; this involves an extensive number of treatment options, and is a complaint that carries with it a high risk of complications and morbidity.  I considered the following differential and admission for this acute, potentially life threatening condition.   MDM:    Consider hypovolemic vs septic shock as most likely causes. He does have h/o hypotension recorded and had metoprolol discontinued and briefly started midodrine but this was not continued on his recent hospital discharge. His mucous membranes are dry and already responded well to some fluids, will give an additional 500 cc bolus and CTM but his BP is improving here. He is afebrile but recently was treated for septic shock, does also report urinary sxs today, consider another UTI or urosepsis and  will get urine. He was also recently treated for surgical site infection though his site today does not appear overtly infection with no increased TTP or drainage. For his AMS, he has no significant hypo/hyperglycemia, electrolyte derangements, no report of head trauma. CTH is neg for ICH or hydrocephalus. CXR without any PNA or other abnormality. He appears dry on exam and does have mild AKI as well. Given his hypotension and recent h/o sepsis w/ report of some infectious sxs, drew cultures and will treat w/ broad spectrum abx, consider metabolic encephalopathy d/t infection. He has been taking narcotic for back pain, possible that his sxs could be due to polypharmacy as well.   Clinical Course as of 11/02/23 0755  Thu Oct 28, 2023  1402 CBC with Differential/Platelet(!) No leukocytosis. Mild drop in Hgb from 9.4 to 8.4.  [HN]  1402 Lactic Acid, Venous: 1.0 wnl [HN]  1402 Comprehensive metabolic panel(!) Mild elevation in Cr from BL 1.2-1.3 to 1.56. BUN/Cr is 21 [HN]  1412 BP(!): 120/58 BP resolved after total 1L NS  [HN]  1412 Resp panel by RT-PCR (RSV, Flu A&B, Covid) Anterior Nasal Swab neg [HN]  1506 DG Chest Port 1 View Hypoinflation of the lungs.  No acute abnormality seen. [HN]  1940 CT Head Wo Contrast IMPRESSION: No acute intracranial process.   [TY]    Clinical Course User Index [HN] Loetta Rough, MD [TY] Coral Spikes, DO    Labs: I Ordered, and personally interpreted labs.  The pertinent results include:  those listed above  Imaging Studies ordered: I ordered imaging studies including CXR I independently visualized and interpreted imaging. I agree with the radiologist interpretation  Additional history obtained from chart review, EMS, family at bedside.    Cardiac Monitoring: The patient was maintained on a cardiac monitor.  I personally viewed and interpreted  the cardiac monitored which showed an underlying rhythm of: NSR  Reevaluation: After the  interventions noted above, I reevaluated the patient and found that they have :Improved  Social Determinants of Health: Lives at facility  Disposition:  Patient is signed out to the oncoming ED physician who is made aware of his history, presentation, exam, workup, and plan. Plan is to obtain remainder of labs including UA and reevaluate mental status.    Co morbidities that complicate the patient evaluation  Past Medical History:  Diagnosis Date   Anemia due to chronic kidney disease    Benign localized prostatic hyperplasia with lower urinary tract symptoms (LUTS)    Bladder cancer (HCC) 04/2023   dr dahlstedt   Carpal tunnel syndrome on both sides    Chronic constipation    Chronic gout without tophus    followed by pcp    (05-07-2023  per pt last flare-up 6-8 months ago, usually affects bilateral great toes)   Chronic low back pain    per pt s/p RFA's   Chronic narcotic use    Chronic systolic CHF (congestive heart failure) (HCC)    followed by cardiology;  preserved ef   CKD (chronic kidney disease), stage III (HCC) 2017   previously seen by nephrologsit--- dr Hyman Hopes   Coronary artery disease 1993   cardiologist--- dr h. Katrinka Blazing;   1993-- s/p cath w/ PTCA to occluded RCA;  a. 2007: s/p CABG x4 (LIMA--> LAD, rSVG--> RI, rSVG-seq -->dRCA and PLA))  c. 2017: LHC with 2/3 patent bypass grafts with occuluded SVG--> RI   DDD (degenerative disc disease), lumbosacral    GERD (gastroesophageal reflux disease)    History of adenomatous polyp of colon    History of cardiomyopathy 09/2016   in setting severe aortic stenosis and new acute on chronic systolic CHF, ef 47-82%   History of DVT of lower extremity 2008   superficial thrombophlebitis-- R Lower leg, right upper leg   History of polymyalgia rheumatica 2011   per pt hx was treated and resolved after being dx approx 2011   History of prostate cancer 2009   urologist--- dr Retta Diones;   dx 2009,  Gleason 4+3;   02-08-2009  s/p  radioactive prostate seed implants by dr Vonita Moss  (05-07-2023  PSA undetectable)   History of renal cell cancer 2014   primary urologist-- dr Frederich Cha;   incidental finding on imaging for back , left renal mass;   08-31-2013  s/p partial left nephrecotmy   History of septic shock 2016   post op  lumbar back surgery, positive blood culture  (discitis/ ostomyelitis)   Hyperlipidemia    Hypertension    Peripheral neuropathy    hands and feet   Pulmonary nodule, right    solitary ,  last Chest CT in epic 12-08-2018 stable   S/P aortic valve replacement with prosthetic valve 07/02/2017   @[facility]  by Dr Laneta Simmers w/ [employer] prosthesis  for severe aortic valve stenosis  (nonrheumatic)   Secondary hyperparathyroidism of renal origin (HCC)    Type 2 diabetes mellitus (HCC)    followed by pcp;   (05-07-2023  per pt only check blood sugar occasionlly)   Wears partial dentures    upper     Medicines Meds ordered this encounter  Medications   sodium chloride 0.9 % bolus 500 mL   sodium chloride flush (NS) 0.9 % injection 10 mL   ceFEPIme (MAXIPIME) 2 g in sodium chloride 0.9 % 100 mL IVPB  Order Specific Question:   Antibiotic Indication:    Answer:   Other Indication (list below)    Order Specific Question:   Other Indication:    Answer:   Unknown source   metroNIDAZOLE (FLAGYL) IVPB 500 mg    Order Specific Question:   Antibiotic Indication:    Answer:   Other Indication (list below)    Order Specific Question:   Other Indication:    Answer:   Unknown source   DISCONTD: vancomycin (VANCOCIN) IVPB 1000 mg/200 mL premix    Order Specific Question:   Indication:    Answer:   Other Indication (list below)    Order Specific Question:   Other Indication:    Answer:   Unknown source   vancomycin (VANCOREADY) IVPB 1500 mg/300 mL    Order Specific Question:   Indication:    Answer:   Other Indication (list below)    Order Specific Question:   Other Indication:    Answer:   Unknown source     I have reviewed the patients home medicines and have made adjustments as needed  Problem List / ED Course: Problem List Items Addressed This Visit       Cardiovascular and Mediastinum   Transient hypotension   Relevant Medications   midodrine (PROAMATINE) tablet 10 mg     Other   AMS (altered mental status)   Other Visit Diagnoses     AKI (acute kidney injury) (HCC)    -  Primary                   This note was created using dictation software, which may contain spelling or grammatical errors.    Loetta Rough, MD 11/02/23 [phone]

## 2023-10-28 NOTE — ED Triage Notes (Signed)
[facility] c/o hypotension, Resolved with 500 ml bolus from [facility]. Also c/o dysuria, generalized weakness x 1 week.. Back surgery recently

## 2023-10-28 NOTE — ED Provider Notes (Signed)
Received shift handoff from Dr. Jearld Fenton, see their HPI.  Patient pending UA and reevaluation  Patient continues to be somewhat confused per family was in room, but almost back to baseline.  Seemingly has waxing and waning mentation per family report.  They provided further history and states that he had what sounds like near syncopal episode yesterday and since that time has had generalized weakness.  They note decreased p.o. intake for the past several days.  Physical Exam  BP (!) 119/58 (BP Location: Left Arm)   Pulse 96   Temp 98.4 F (36.9 C) (Oral)   Resp 15   Wt 74 kg   SpO2 97%   BMI 25.94 kg/m   Physical Exam Vitals and nursing note reviewed.  HENT:     Mouth/Throat:     Mouth: Mucous membranes are dry.  Eyes:     Conjunctiva/sclera: Conjunctivae normal.  Cardiovascular:     Rate and Rhythm: Normal rate and regular rhythm.  Pulmonary:     Effort: Pulmonary effort is normal.  Abdominal:     General: Abdomen is flat. There is no distension.     Tenderness: There is no abdominal tenderness.  Musculoskeletal:     Right lower leg: No edema.  Skin:    General: Skin is warm.     Capillary Refill: Capillary refill takes less than 2 seconds.  Neurological:     Mental Status: He is alert. He is disoriented.     Motor: Weakness (global) present.  Psychiatric:        Mood and Affect: Mood normal.        Behavior: Behavior normal.     Procedures  Procedures  ED Course / MDM   Clinical Course as of 10/28/23 2319  Thu Oct 28, 2023  1402 CBC with Differential/Platelet(!) No leukocytosis. Mild drop in Hgb from 9.4 to 8.4.  [HN]  1402 Lactic Acid, Venous: 1.0 wnl [HN]  1402 Comprehensive metabolic panel(!) Mild elevation in Cr from BL 1.2-1.3 to 1.56. BUN/Cr is 21 [HN]  1412 BP(!): 120/58 BP resolved after total 1L NS  [HN]  1412 Resp panel by RT-PCR (RSV, Flu A&B, Covid) Anterior Nasal Swab neg [HN]  1506 DG Chest Port 1 View Hypoinflation of the lungs.  No acute  abnormality seen. [HN]  1940 CT Head Wo Contrast IMPRESSION: No acute intracranial process.   [TY]    Clinical Course User Index [HN] Loetta Rough, MD [TY] Coral Spikes, DO   Medical Decision Making 89 year old male presenting emergency department for hypotension.  Seen primarily by Dr. Jonette Pesa, see their note for full HPI.  His blood pressure resolved with 500 mL fluid with EMS, and has received 500 mL of more fluid here in the emergency department.  He has remained normotensive.  He is afebrile nontachycardic hemodynamically stable.  Received broad-spectrum antibiotics as he had recent admission for urosepsis.  Per review of labs, no leukocytosis elevated lactate.  Flu COVID-negative.  UA without obvious signs of infection.  He continues to be somewhat altered per family and appears to have mild AKI.  Will get CT head as he did reportedly have a near syncopal episode and fell yesterday.  And will plan for admission for altered mental status which I suspect may be multifactorial and due to narcotics as well as his dehydration.  Amount and/or Complexity of Data Reviewed Labs: ordered. Decision-making details documented in ED Course. Radiology: ordered. Decision-making details documented in ED Course.  Risk Prescription drug management.  Decision regarding hospitalization.         Coral Spikes, DO 10/28/23 2319

## 2023-10-29 ENCOUNTER — Observation Stay (HOSPITAL_COMMUNITY): Payer: PPO

## 2023-10-29 DIAGNOSIS — R55 Syncope and collapse: Secondary | ICD-10-CM | POA: Diagnosis not present

## 2023-10-29 DIAGNOSIS — I959 Hypotension, unspecified: Secondary | ICD-10-CM | POA: Diagnosis not present

## 2023-10-29 DIAGNOSIS — R319 Hematuria, unspecified: Secondary | ICD-10-CM

## 2023-10-29 DIAGNOSIS — N179 Acute kidney failure, unspecified: Secondary | ICD-10-CM | POA: Diagnosis not present

## 2023-10-29 DIAGNOSIS — G9341 Metabolic encephalopathy: Secondary | ICD-10-CM | POA: Diagnosis not present

## 2023-10-29 LAB — CBC
HCT: 26.6 % — ABNORMAL LOW (ref 39.0–52.0)
Hemoglobin: 8.1 g/dL — ABNORMAL LOW (ref 13.0–17.0)
MCH: 30.1 pg (ref 26.0–34.0)
MCHC: 30.5 g/dL (ref 30.0–36.0)
MCV: 98.9 fL (ref 80.0–100.0)
Platelets: 320 10*3/uL (ref 150–400)
RBC: 2.69 MIL/uL — ABNORMAL LOW (ref 4.22–5.81)
RDW: 14.6 % (ref 11.5–15.5)
WBC: 9.1 10*3/uL (ref 4.0–10.5)
nRBC: 0 % (ref 0.0–0.2)

## 2023-10-29 LAB — URINALYSIS, COMPLETE (UACMP) WITH MICROSCOPIC: RBC / HPF: >50 RBC/hpf (ref 0–5)

## 2023-10-29 LAB — BASIC METABOLIC PANEL
Anion gap: 9 (ref 5–15)
BUN: 28 mg/dL — ABNORMAL HIGH (ref 8–23)
CO2: 25 mmol/L (ref 22–32)
Calcium: 10.6 mg/dL — ABNORMAL HIGH (ref 8.9–10.3)
Chloride: 104 mmol/L (ref 98–111)
Creatinine, Ser: 1.38 mg/dL — ABNORMAL HIGH (ref 0.61–1.24)
GFR, Estimated: 49 mL/min — ABNORMAL LOW (ref >60)
Glucose, Bld: 125 mg/dL — ABNORMAL HIGH (ref 70–99)
Potassium: 3.6 mmol/L (ref 3.5–5.1)
Sodium: 138 mmol/L (ref 135–145)

## 2023-10-29 LAB — URINE CULTURE: Culture: <10000 — AB

## 2023-10-29 LAB — GLUCOSE, CAPILLARY
Glucose-Capillary: 117 mg/dL — ABNORMAL HIGH (ref 70–99)
Glucose-Capillary: 130 mg/dL — ABNORMAL HIGH (ref 70–99)
Glucose-Capillary: 123 mg/dL — ABNORMAL HIGH (ref 70–99)
Glucose-Capillary: 138 mg/dL — ABNORMAL HIGH (ref 70–99)

## 2023-10-29 LAB — MAGNESIUM: Magnesium: 2 mg/dL (ref 1.7–2.4)

## 2023-10-29 LAB — BRAIN NATRIURETIC PEPTIDE: B Natriuretic Peptide: 236 pg/mL — ABNORMAL HIGH (ref 0.0–100.0)

## 2023-10-29 LAB — HEMOGLOBIN A1C
Hgb A1c MFr Bld: 6.4 % — ABNORMAL HIGH (ref 4.8–5.6)
Mean Plasma Glucose: 136.98 mg/dL

## 2023-10-29 MED ORDER — ASPIRIN 81 MG PO CHEW: 81.0000 mg | CHEWABLE_TABLET | Freq: Every evening | ORAL | Status: DC

## 2023-10-29 MED ORDER — METOPROLOL SUCCINATE ER 25 MG PO TB24: 25.0000 mg | ORAL_TABLET | Freq: Every day | ORAL | Status: DC

## 2023-10-29 MED ORDER — DULOXETINE HCL 20 MG PO CPEP
20.0000 mg | ORAL_CAPSULE | Freq: Two times a day (BID) | ORAL | Status: DC
Start: 2023-10-29 — End: 2023-11-05
  Administered 2023-10-29 – 2023-11-05 (×14): 20 mg via ORAL
  Filled 2023-10-29 (×14): qty 1

## 2023-10-29 MED ORDER — ENSURE MAX PROTEIN PO LIQD
11.0000 [oz_av] | Freq: Every day | ORAL | Status: DC
  Administered 2023-10-30 – 2023-11-05 (×6): 11 [oz_av] via ORAL
  Filled 2023-10-29 (×7): qty 330

## 2023-10-29 MED ORDER — OXYCODONE HCL 5 MG PO TABS
5.0000 mg | ORAL_TABLET | ORAL | Status: DC | PRN
  Administered 2023-10-29 – 2023-11-02 (×9): 5 mg via ORAL
  Filled 2023-10-29 (×9): qty 1

## 2023-10-29 MED ORDER — ONDANSETRON HCL 4 MG/2ML IJ SOLN
4.0000 mg | Freq: Four times a day (QID) | INTRAMUSCULAR | Status: AC | PRN
  Administered 2023-10-29 – 2023-10-30 (×2): 4 mg via INTRAVENOUS
  Filled 2023-10-29 (×2): qty 2

## 2023-10-29 MED ORDER — POLYVINYL ALCOHOL 1.4 % OP SOLN
1.0000 [drp] | Freq: Two times a day (BID) | OPHTHALMIC | Status: DC
  Administered 2023-10-29 – 2023-11-05 (×14): 1 [drp] via OPHTHALMIC
  Filled 2023-10-29: qty 15

## 2023-10-29 MED ORDER — TAMSULOSIN HCL 0.4 MG PO CAPS
0.4000 mg | ORAL_CAPSULE | Freq: Two times a day (BID) | ORAL | Status: DC
  Administered 2023-10-29 – 2023-11-05 (×14): 0.4 mg via ORAL
  Filled 2023-10-29 (×14): qty 1

## 2023-10-29 MED ORDER — SODIUM CHLORIDE 0.9 % IV SOLN: INTRAVENOUS | Status: AC

## 2023-10-29 MED ORDER — ADULT MULTIVITAMIN W/MINERALS CH
1.0000 | ORAL_TABLET | Freq: Every day | ORAL | Status: DC
  Administered 2023-10-29 – 2023-11-04 (×7): 1 via ORAL
  Filled 2023-10-29 (×7): qty 1

## 2023-10-29 MED ORDER — POLYETHYLENE GLYCOL 3350 17 G PO PACK: 17.0000 g | PACK | Freq: Every day | ORAL | Status: DC | PRN

## 2023-10-29 MED ORDER — GABAPENTIN 100 MG PO CAPS
100.0000 mg | ORAL_CAPSULE | Freq: Four times a day (QID) | ORAL | Status: DC | PRN
  Administered 2023-10-29 – 2023-11-04 (×9): 100 mg via ORAL
  Filled 2023-10-29 (×8): qty 1

## 2023-10-29 MED ORDER — VITAMIN D 25 MCG (1000 UNIT) PO TABS
5000.0000 [IU] | ORAL_TABLET | Freq: Every day | ORAL | Status: DC
  Administered 2023-10-29 – 2023-11-05 (×8): 5000 [IU] via ORAL
  Filled 2023-10-29 (×8): qty 5

## 2023-10-29 MED ORDER — ACETAMINOPHEN 500 MG PO TABS
1000.0000 mg | ORAL_TABLET | Freq: Three times a day (TID) | ORAL | Status: DC
  Administered 2023-10-29 – 2023-11-04 (×18): 1000 mg via ORAL
  Filled 2023-10-29 (×19): qty 2

## 2023-10-29 NOTE — Consult Note (Signed)
WOC Nurse Consult Note: this patient is familiar to [facility] team from previous consult regarding this lumbar wound, had surgery at [facility] 9/16;last seen at [facility] 09/21/2023  Reason for Consult: back wound  Wound type: surgical w/dehiscence (photo 09/24/2023 was covered with slough, now has opened)  Pressure Injury POA: NA  Measurement: 8 cm x 2 cm x  1 cm  Wound bed:50% pink 50% loose yellow slough  Drainage (amount, consistency, odor) minimal tan  Periwound:erythema consistent with last photo  Dressing procedure/placement/frequency: Clean back wound with NS, apply Silver Hydrofiber (Aquacel [place] [phone]) cut to fit wound bed daily,  use a Q tip applicator to insert into wound bed leaving a tail of silver hanging out of wound for easy removal.   Cover with dry gauze and silicone foam. May lift foam daily to replace silver.  Change foam q3 days and prn soiling. Moisten silver with NS if stuck to wound bed for atraumatic removal.  Patient should continue his follow-up with surgeon after discharge for continuing assessment and care of this incision.   POC discussed with bedside nurse. WOC team will not follow. Re-consult if further needs arise.   Thank you,     Priscella Mann MSN, RN-BC, [employer] [phone]

## 2023-10-29 NOTE — TOC Initial Note (Signed)
Transition of Care [facility] - Initial/Assessment Note    Patient Details  Name: [name] MRN: [id number] Date of Birth: 02-18-1934  Transition of Care [facility]) CM/SW Contact:    [name], LCSW Phone Number: 10/29/2023, 11:57 AM  Clinical Narrative:                 Pt from [facility] where he is currently residing for short term SNF. Spoke with pt's nephew via t/c who confirms plan for pt to return to [facility] at discharge. CSW confirmed with [facility] that pt is able to return when medically ready.  TOC will follow.   Expected Discharge Plan: Skilled Nursing Facility Barriers to Discharge: No Barriers Identified   Patient Goals and CMS Choice Patient states their goals for this hospitalization and ongoing recovery are:: For pt to return to [facility] for SNF CMS Medicare.gov Compare Post Acute Care list provided to:: Patient Represenative (must comment) Choice offered to / list presented to : [facility] POA / Guardian [place] ownership interest in [facility].provided to:: [facility] POA / Guardian    Expected Discharge Plan and Services In-house Referral: Clinical Social Work   Post Acute Care Choice: Skilled Nursing Facility, Resumption of Svcs/PTA Provider Living arrangements for the past 2 months: Single Family Home                 DME Arranged: N/A DME Agency: NA                  Prior Living Arrangements/Services Living arrangements for the past 2 months: Single Family Home Lives with:: Spouse Patient language and need for interpreter reviewed:: Yes Do you feel safe going back to the place where you live?: Yes      Need for Family Participation in Patient Care: Yes (Comment) Care giver support system in place?: Yes (comment) Current home services: DME Criminal Activity/Legal Involvement Pertinent to Current Situation/Hospitalization: No - Comment as needed  Activities of Daily Living   ADL Screening (condition at time of admission) Independently  performs ADLs?: No Does the patient have a NEW difficulty with bathing/dressing/toileting/self-feeding that is expected to last >3 days?: Yes (Initiates electronic notice to provider for possible OT consult) Does the patient have a NEW difficulty with getting in/out of bed, walking, or climbing stairs that is expected to last >3 days?: Yes (Initiates electronic notice to provider for possible PT consult) Does the patient have a NEW difficulty with communication that is expected to last >3 days?: Yes (Initiates electronic notice to provider for possible SLP consult) Is the patient deaf or have difficulty hearing?: Yes Does the patient have difficulty seeing, even when wearing glasses/contacts?: No Does the patient have difficulty concentrating, remembering, or making decisions?: Yes  Permission Sought/Granted Permission sought to share information with : Facility Medical sales representative, Family Supports Permission granted to share information with : Yes, Verbal Permission Granted  Share Information with NAME: Nephew, [name] and Spouse Dermot Revers  Permission granted to share info w AGENCY: [facility]        Emotional Assessment   Attitude/Demeanor/Rapport: Unable to Assess Affect (typically observed): Unable to Assess Orientation: : Oriented to Place, Oriented to Self Alcohol / Substance Use: Not Applicable Psych Involvement: No (comment)  Admission diagnosis:  Disorientation [R41.0] AKI (acute kidney injury) (HCC) [N17.9] Transient hypotension [I95.9] AMS (altered mental status) [R41.82] Patient Active Problem List   Diagnosis Date Noted   Acute renal failure superimposed on stage 3a chronic kidney disease (HCC) 10/28/2023  Acute metabolic encephalopathy 10/28/2023   Near syncope 10/28/2023   Physical deconditioning 10/28/2023   Transient hypotension 09/24/2023   Wound cellulitis after surgery 09/24/2023   S/P CABG (coronary artery bypass graft) 07/12/2023   Myalgia due  to statin 07/12/2023   Chronic pain syndrome 09/08/2022   Lumbar radiculopathy 08/06/2022   History of renal cell cancer    Diabetes mellitus (HCC)    Prostate cancer (HCC)    Coronary artery disease    Chronic combined systolic and diastolic heart failure (HCC)    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   Discitis 12/22/2015   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   Sepsis secondary to UTI (HCC) 10/26/2015   CKD (chronic kidney disease), stage III (HCC) 10/26/2015   Essential hypertension 02/11/2015   Hyperlipidemia associated with type 2 diabetes mellitus (HCC)    GERD (gastroesophageal reflux disease)    H/O blood clots    Low back pain    Polymyalgia (HCC)    Gout    Glaucoma    PCP:  [name], MD Pharmacy:   [facility] - [place], [place] - [address] [place] [address] Phone: [phone] Fax: [phone]     Social Determinants of Health (SDOH) Social History: SDOH Screenings   Food Insecurity: Patient Unable To Answer (10/28/2023)  Housing: High Risk (10/28/2023)  Transportation Needs: Patient Unable To Answer (10/28/2023)  Utilities: Patient Unable To Answer (10/28/2023)  Social Connections: Unknown (05/05/2022)   Received from [facility]  Stress: No Stress Concern Present (09/06/2023)   Received from [facility]  Tobacco Use: Medium Risk (10/28/2023)   SDOH Interventions:     Readmission Risk Interventions     No data to display

## 2023-10-29 NOTE — Progress Notes (Signed)
PROGRESS NOTE    [name]  ZOX:[id number] DOB: 08-09-1934 DOA: 10/28/2023 PCP: [name], MD (Confirm with patient/family/NH records and if not entered, this HAS to be entered at [facility] point of entry. "No PCP" if truly none.)   Chief Complaint  Patient presents with   Hypotension    Brief Narrative:  Patient 89 year old gentleman history of prostate cancer status post radiation, CKD stage II-3A, renal cell carcinoma status post nephrectomy, bladder cancer, hypertension, hyperlipidemia, CAD status post CABG, type 2 diabetes, chronic HFrEF, history of TAVR, chronic back pain on chronic opiates, gout, GERD recently admitted 09/23/2023-09/30/2023 for surgical wound infection of the back with cellulitis and UTI.  Patient also noted to be hypotensive during that hospitalization and treated with midodrine which was discontinued prior to discharge.  Patient presented to the ED with transient hypotension which responded to IV fluids, dysuria, generalized weakness x 1 week.  Family also with concerns for confusion over the past few days and near syncopal episode.  Patient noted to have had decreased oral intake.  Patient seen in the ED, placed on IV fluids, COVID-19 PCR, influenza AMB PCR, RSV PCR negative.  Blood cultures obtained.  Urinalysis also obtained as well as urine cultures.  Patient also noted to be in acute kidney injury.  Head CT negative.  Patient given Percocet, vancomycin, cefepime, metronidazole in the ED and patient admitted for further evaluation and management.   Assessment & Plan:   Principal Problem:   Acute renal failure superimposed on stage 3a chronic kidney disease (HCC) Active Problems:   Essential hypertension   Diabetes mellitus (HCC)   Transient hypotension   Acute metabolic encephalopathy   Near syncope   Physical deconditioning  #1 AKI on CKD stage II-IIIa -Blood secondary to a prerenal azotemia in the setting of poor oral intake, dehydration and  diuretics. -Creatinine on admission noted at 1.5 with baseline 1.0-1.2. -Urinalysis done with trace leukocytes, nitrite negative, 11-20 WBCs. -Urine cultures obtained. -Patient on IV fluids urine output not accurately recorded over the past 24 hours. -Renal function slowly trending down creatinine at 1.38. -Continue to hold torsemide. -Increase IV fluids to 125 cc an hour and follow.  2.  Hematuria -Per RN patient with some hematuria this afternoon. -Patient with complaints of back pain however also does have chronic back pain. -Check a UA with cultures and sensitivities. -Renal stone protocol. -If continued hematuria will place a Foley catheter and may need irrigation. -Supportive care.  3.  Acute metabolic encephalopathy -Per family patient noted to have some confusion. -Likely multifactorial secondary to dehydration in the setting of chronic opioid use for back pain. -Patient received IV antibiotics in the ED, no obvious infection noted at this time as patient with no fever, no leukocytosis, no meningeal signs. -Patient with no respiratory symptoms, chest x-ray negative for any acute infiltrates. -Urinalysis not strongly suggestive of UTI with urine cultures pending. -Patient with no signs of infection of lower back surgical wound site at this time. -CT head negative for any acute abnormalities. -Patient with recent labs a month ago with TSH, vitamin B12, B1 levels within normal limits. -Ammonia levels within normal limits. -Patient improving clinically with hydration. -Minimize narcotics and if needed placed on half home dose. -Supportive care.  4.  Transient hypotension -Patient presentation noted to have a transient hypotension which responded to IV fluids. -Diuretics of torsemide on hold. -Continue IV fluids and follow.  5.  Near syncope -As noted per admitting physician (ED report family reported  near syncopal episode the day prior to admission likely secondary to  dehydration and hypotension. -CT head with no acute abnormalities. -Patient with no acute focal neurological deficits. -Patient not hypoxic with no shortness of breath and low probability for PE. -Clinical improvement with hydration.  6.  Generalized weakness/physical deconditioning -PT/OT.  7.  Hypertension -Continue to hold Toprol-XL, torsemide.  8.  Diabetes mellitus type 2 -Hemoglobin A1c 6.4 (10/28/2023) -CBG 117 this morning. -Hold oral hypoglycemic agents. -SSI.  9.  Chronic back pain -Patient noted to be on chronic opioids for a while for his chronic back pain. -Due to concerns opioid may be contributing to patient's altered mental status on presentation we will place on a decreased dose of oxycodone 5 mg every 4 hours as needed pain. -Placed on scheduled Tylenol 1000 mg 3 times daily. -Follow-up.  10.  Chronic HFrEF -2D echo from 08/05/2023 with a EF of 30%, grade 1 diastolic dysfunction, right ventricular systolic function moderately reduced, mild MVR, trivial AVR.  Patient with no signs of volume overload. -Diuretics on hold. -IV fluids and monitor volume status.  11.  Normocytic anemia -Patient noted with some hematuria per RN. -Hemoglobin currently stable at 8.1. -Check an anemia panel, follow H&H. -Transfusion threshold hemoglobin < 8.  12.  GERD -Pepcid  13.  CAD status post CABG -Stable. -Continue to hold home regimen aspirin in light of concern for hematuria. -Due to transient hypotension on admission continue to hold Toprol-XL and Demadex.    DVT prophylaxis: Heparin>>>> SCDs Code Status: Full Family Communication: Updated patient, wife, nephew at bedside. Disposition:   Status is: Observation The patient remains OBS appropriate and will d/c before 2 midnights.   Consultants:  None  Procedures:  CT head 10/28/2023 Chest x-ray 10/28/2023   Antimicrobials:  Anti-infectives (From admission, onward)    Start     Dose/Rate Route Frequency  Ordered Stop   10/28/23 1300  vancomycin (VANCOREADY) IVPB 1500 mg/300 mL        1,500 mg 150 mL/hr over 120 Minutes Intravenous  Once 10/28/23 1240 10/28/23 1900   10/28/23 1245  ceFEPIme (MAXIPIME) 2 g in sodium chloride 0.9 % 100 mL IVPB        2 g 200 mL/hr over 30 Minutes Intravenous  Once 10/28/23 1236 10/28/23 1513   10/28/23 1245  metroNIDAZOLE (FLAGYL) IVPB 500 mg        500 mg 100 mL/hr over 60 Minutes Intravenous  Once 10/28/23 1236 10/28/23 1406   10/28/23 1245  vancomycin (VANCOCIN) IVPB 1000 mg/200 mL premix  Status:  Discontinued        1,000 mg 200 mL/hr over 60 Minutes Intravenous  Once 10/28/23 1236 10/28/23 1239         Subjective: Patient sitting up in chair.  Just finished working with OT.  Denies any chest pain or shortness of breath.  Denies any abdominal pain.  Denies any dysuria.  Per RN some hematuria noted.  Patient complaining of back pain.  Objective: Vitals:   10/29/23 0144 10/29/23 0300 10/29/23 0533 10/29/23 0941  BP: (!) 112/91  118/64 123/60  Pulse: 93  95 (!) 107  Resp:      Temp: 98.2 F (36.8 C) 98.5 F (36.9 C) 99 F (37.2 C) 98.3 F (36.8 C)  TempSrc: Oral   Oral  SpO2: 95%  95% 96%  Weight:        Intake/Output Summary (Last 24 hours) at 10/29/2023 1306 Last data filed at 10/29/2023 1135 Gross per  24 hour  Intake 1000 ml  Output 350 ml  Net 650 ml   Filed Weights   10/28/23 1247  Weight: 74 kg    Examination:  General exam: NAD. Respiratory system: Clear to auscultation.  No wheezes, no crackles, no rhonchi.  Fair air movement.  Speaking in full sentences.  Respiratory effort normal. Cardiovascular system: S1 & S2 heard, RRR. No JVD, murmurs, rubs, gallops or clicks. No pedal edema. Gastrointestinal system: Abdomen is nondistended, soft and nontender. No organomegaly or masses felt. Normal bowel sounds heard. Central nervous system: Alert and oriented. No focal neurological deficits. Extremities: Symmetric 5 x 5  power. Skin: No rashes, lesions or ulcers Psychiatry: Judgement and insight appear normal. Mood & affect appropriate.     Data Reviewed: I have personally reviewed following labs and imaging studies  CBC: Recent Labs  Lab 10/28/23 1227 10/29/23 0604  WBC 10.0 9.1  NEUTROABS 6.6  --   HGB 8.4* 8.1*  HCT 27.3* 26.6*  MCV 98.2 98.9  PLT 328 320    Basic Metabolic Panel: Recent Labs  Lab 10/28/23 1227 10/29/23 0604  NA 136 138  K 3.6 3.6  CL 101 104  CO2 26 25  GLUCOSE 147* 125*  BUN 32* 28*  CREATININE 1.56* 1.38*  CALCIUM 10.3 10.6*  MG  --  2.0    GFR: Estimated Creatinine Clearance: 34.7 mL/min (A) (by C-G formula based on SCr of 1.38 mg/dL (H)).  Liver Function Tests: Recent Labs  Lab 10/28/23 1227  AST 16  ALT 8  ALKPHOS 49  BILITOT 0.4  PROT 6.6  ALBUMIN 2.4*    CBG: Recent Labs  Lab 10/28/23 2143 10/29/23 0803 10/29/23 1258  GLUCAP 140* 117* 130*     Recent Results (from the past 240 hour(s))  Resp panel by RT-PCR (RSV, Flu A&B, Covid) Anterior Nasal Swab     Status: None   Collection Time: 10/28/23 12:45 PM   Specimen: Anterior Nasal Swab  Result Value Ref Range Status   SARS Coronavirus 2 by RT PCR NEGATIVE NEGATIVE Final    Comment: (NOTE) SARS-CoV-2 target nucleic acids are NOT DETECTED.  The SARS-CoV-2 RNA is generally detectable in upper respiratory specimens during the acute phase of infection. The lowest concentration of SARS-CoV-2 viral copies this assay can detect is 138 copies/mL. A negative result does not preclude SARS-Cov-2 infection and should not be used as the sole basis for treatment or other patient management decisions. A negative result may occur with  improper specimen collection/handling, submission of specimen other than nasopharyngeal swab, presence of viral mutation(s) within the areas targeted by this assay, and inadequate number of viral copies(<138 copies/mL). A negative result must be combined  with clinical observations, patient history, and epidemiological information. The expected result is Negative.  Fact Sheet for Patients:  BloggerCourse.com  Fact Sheet for Healthcare Providers:  SeriousBroker.it  This test is no t yet approved or cleared by the [place] FDA and  has been authorized for detection and/or diagnosis of SARS-CoV-2 by FDA under an Emergency Use Authorization (EUA). This EUA will remain  in effect (meaning this test can be used) for the duration of the COVID-19 declaration under Section 564(b)(1) of the Act, 21 U.S.C.section 360bbb-3(b)(1), unless the authorization is terminated  or revoked sooner.       Influenza A by PCR NEGATIVE NEGATIVE Final   Influenza B by PCR NEGATIVE NEGATIVE Final    Comment: (NOTE) The Xpert Xpress SARS-CoV-2/FLU/RSV plus assay is intended as  an aid in the diagnosis of influenza from Nasopharyngeal swab specimens and should not be used as a sole basis for treatment. Nasal washings and aspirates are unacceptable for Xpert Xpress SARS-CoV-2/FLU/RSV testing.  Fact Sheet for Patients: BloggerCourse.com  Fact Sheet for Healthcare Providers: SeriousBroker.it  This test is not yet approved or cleared by the [place] FDA and has been authorized for detection and/or diagnosis of SARS-CoV-2 by FDA under an Emergency Use Authorization (EUA). This EUA will remain in effect (meaning this test can be used) for the duration of the COVID-19 declaration under Section 564(b)(1) of the Act, 21 U.S.C. section 360bbb-3(b)(1), unless the authorization is terminated or revoked.     Resp Syncytial Virus by PCR NEGATIVE NEGATIVE Final    Comment: (NOTE) Fact Sheet for Patients: BloggerCourse.com  Fact Sheet for Healthcare Providers: SeriousBroker.it  This test is not yet approved  or cleared by the [place] FDA and has been authorized for detection and/or diagnosis of SARS-CoV-2 by FDA under an Emergency Use Authorization (EUA). This EUA will remain in effect (meaning this test can be used) for the duration of the COVID-19 declaration under Section 564(b)(1) of the Act, 21 U.S.C. section 360bbb-3(b)(1), unless the authorization is terminated or revoked.  Performed at [facility], [address], [place], [place] [address]   Blood Culture (routine x 2)     Status: None (Preliminary result)   Collection Time: 10/28/23 12:50 PM   Specimen: BLOOD  Result Value Ref Range Status   Specimen Description   Final    BLOOD SITE NOT SPECIFIED Performed at [facility], [address], [place], [place] [address]    Special Requests   Final    BOTTLES DRAWN AEROBIC AND ANAEROBIC Blood Culture results may not be optimal due to an inadequate volume of blood received in culture bottles Performed at [facility], [address] [place], [place] [address]    Culture   Final    NO GROWTH < 24 HOURS Performed at [facility], [address], [place], [place] [address]    Report Status PENDING  Incomplete  Blood Culture (routine x 2)     Status: None (Preliminary result)   Collection Time: 10/28/23  1:00 PM   Specimen: BLOOD  Result Value Ref Range Status   Specimen Description   Final    BLOOD SITE NOT SPECIFIED Performed at [facility], [address], [place], [place] [address]    Special Requests   Final    BOTTLES DRAWN AEROBIC AND ANAEROBIC Blood Culture adequate volume Performed at [facility], [address], [place], [place] [address]    Culture   Final    NO GROWTH < 24 HOURS Performed at [facility], [address] [place], [place] [address]    Report Status PENDING  Incomplete  Urine Culture     Status: Abnormal   Collection Time: 10/28/23   2:39 PM   Specimen: Urine, Random  Result Value Ref Range Status   Specimen Description   Final    URINE, RANDOM Performed at [facility], [address], [place], [place] [address]    Special Requests   Final    NONE Reflexed from [id number] Performed at [facility], [address] [place], [place] [address]    Culture (A)  Final    <10,000 COLONIES/mL INSIGNIFICANT GROWTH Performed at [facility], [address] [place], [place] [address]    Report Status 10/29/2023 FINAL  Final         Radiology Studies: CT Head Wo Contrast  Result Date: 10/28/2023 CLINICAL DATA:  Hypotension, weakness EXAM: CT HEAD WITHOUT CONTRAST TECHNIQUE: Contiguous axial images were obtained from the base of the skull through the vertex without intravenous contrast. RADIATION DOSE REDUCTION: This exam was performed according to the departmental dose-optimization program which includes automated exposure control, adjustment of the mA and/or kV according to patient size and/or use of iterative reconstruction technique. COMPARISON:  09/28/2023 FINDINGS: Brain: No evidence of acute infarction, hemorrhage, mass, mass effect, or midline shift. No hydrocephalus or extra-axial fluid collection. Age related cerebral atrophy Vascular: No hyperdense vessel. Atherosclerotic calcifications in the intracranial carotid and vertebral arteries. Skull: Negative for fracture or focal lesion. Sinuses/Orbits: No acute finding. Other: The mastoid air cells are well aerated. IMPRESSION: No acute intracranial process. Electronically Signed   By: Wiliam Ke M.D.   On: 10/28/2023 19:36   DG Chest Port 1 View  Result Date: 10/28/2023 CLINICAL DATA:  Possible sepsis.  Hypotension. EXAM: PORTABLE CHEST 1 VIEW COMPARISON:  September 23, 2023. FINDINGS: Stable cardiomediastinal silhouette. 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 Hypoinflation of the lungs is  noted. Lungs are clear. Bony thorax is unremarkable. IMPRESSION: Hypoinflation of the lungs.  No acute abnormality seen. Electronically Signed   By: Lupita Raider M.D.   On: 10/28/2023 14:46        Scheduled Meds:  heparin injection (subcutaneous)  5,000 Units Subcutaneous Q8H   insulin aspart  0-5 Units Subcutaneous QHS   insulin aspart  0-9 Units Subcutaneous TID WC   sodium chloride flush  10 mL Intravenous Q12H   Continuous Infusions:  sodium chloride 75 mL/hr at 10/29/23 1031     LOS: 0 days    Time spent: 40 minutes    Ramiro Harvest, MD [facility]   To contact the attending provider between 7A-7P or the covering provider during after hours 7P-7A, please log into the web site [email or web address] and access using universal [place] password for that web site. If you do not have the password, please call the hospital operator.  10/29/2023, 1:06 PM

## 2023-10-29 NOTE — Evaluation (Signed)
Occupational Therapy Evaluation Patient Details Name: [name] MRN: [id number] DOB: 1934-02-13 Today's Date: 10/29/2023   History of Present Illness 89 y.o. male admitted with acute metabolic encephalopathy, AKI, transient hypotension and near syncope event. PMH: prostate cancer status post radiation, CKD stage II-IIIa, renal cell carcinoma status post nephrectomy, bladder cancer, hypertension, hyperlipidemia, CAD status post CABG, type 2 diabetes, chronic HFrEF, history of TAVR, chronic back pain on opiate, gout, GERD.  Admittion 10/3-10/10 for surgical wound infection of his back with cellulitis and also UTI.   Clinical Impression   PTA pt undergoing rehab at [facility]. Prior to SNF, pt living independently with his wife. Currently Mr Schweder requires min A for mobility and Mod A with ADL tasks @ RW level due to below listed deficits. Pt appears orthostatic with drop in BP in supine 130/62 to 100/60 with sitting. Pt with minimal symptoms and able to transfer to [facility] then to recliner. BP once sitting in chair  -120/60. Nsg/MD made aware. Patient will continue to benefit from continued inpatient follow up therapy, <3 hours/day. Acute OT to follow to facilitate DC to next venue.       If plan is discharge home, recommend the following: A lot of help with bathing/dressing/bathroom;A little help with walking and/or transfers;Assist for transportation;Help with stairs or ramp for entrance    Functional Status Assessment  Patient has had a recent decline in their functional status and demonstrates the ability to make significant improvements in function in a reasonable and predictable amount of time.  Equipment Recommendations  None recommended by OT    Recommendations for Other Services       Precautions / Restrictions Precautions Precautions: Fall Precaution Comments: back surgery 08/2023 Restrictions Weight Bearing Restrictions: No      Mobility Bed Mobility Overal bed mobility: Needs  Assistance Bed Mobility: Sidelying to Sit   Sidelying to sit: Min assist       General bed mobility comments: min A to roll and progress from sidelying to sit with back precautions    Transfers Overall transfer level: Needs assistance Equipment used: Rolling walker (2 wheels) Transfers: Sit to/from Stand, Bed to chair/wheelchair/BSC Sit to Stand: Min assist     Step pivot transfers: Min assist            Balance Overall balance assessment: Needs assistance   Sitting balance-Leahy Scale: Good       Standing balance-Leahy Scale: Poor                             ADL either performed or assessed with clinical judgement   ADL Overall ADL's : Needs assistance/impaired     Grooming: Set up;Supervision/safety   Upper Body Bathing: Minimal assistance   Lower Body Bathing: Moderate assistance;Sit to/from stand   Upper Body Dressing : Minimal assistance   Lower Body Dressing: Moderate assistance;Sit to/from stand   Toilet Transfer: Minimal assistance;Stand-pivot;Cueing for safety;BSC/3in1;Rolling walker (2 wheels)   Toileting- Clothing Manipulation and Hygiene: Moderate assistance       Functional mobility during ADLs: Minimal assistance;Cueing for sequencing;Cueing for safety;Rolling walker (2 wheels)       Vision         Perception         Praxis         Pertinent Vitals/Pain Pain Assessment Pain Assessment: Faces Faces Pain Scale: Hurts little more Pain Location: back Pain Descriptors / Indicators: Grimacing, Guarding Pain Intervention(s): Limited activity within  patient's tolerance     Extremity/Trunk Assessment Upper Extremity Assessment Upper Extremity Assessment: Generalized weakness   Lower Extremity Assessment Lower Extremity Assessment: Defer to PT evaluation RLE: Unable to fully assess due to pain LLE: Unable to fully assess due to pain   Cervical / Trunk Assessment Cervical / Trunk Assessment: Kyphotic;Back Surgery  (sx 08/2023)   Communication Communication Communication: No apparent difficulties   Cognition Arousal: Alert Behavior During Therapy: WFL for tasks assessed/performed Overall Cognitive Status: Impaired/Different from baseline Area of Impairment: Orientation, Attention, Memory, Safety/judgement, Awareness                 Orientation Level: Disoriented to, Time (unaware of daylknew month and year)   Memory: Decreased short-term memory   Safety/Judgement: Decreased awareness of safety Awareness: Emergent         General Comments       Exercises     Shoulder Instructions      Home Living Family/patient expects to be discharged to:: Skilled nursing facility Living Arrangements: Spouse/significant other Available Help at Discharge: Family;Available 24 hours/day Type of Home: House Home Access: Stairs to enter [employer] of Steps: 2   Home Layout: One level     Bathroom Shower/Tub: Walk-in shower;Door   Bathroom Toilet: Handicapped height     Home Equipment: Agricultural consultant (2 wheels);Cane - single point;Shower seat;Grab bars - tub/shower;Grab bars - toilet          Prior Functioning/Environment Prior Level of Function : Independent/Modified Independent;Driving             Mobility Comments: since back surgery/rehab, has been ahving assistance with ADL and mobility ADLs Comments: wife assists        OT Problem List: Decreased strength;Decreased range of motion;Decreased activity tolerance;Impaired balance (sitting and/or standing);Decreased safety awareness;Decreased knowledge of use of DME or AE;Decreased knowledge of precautions;Pain      OT Treatment/Interventions: Self-care/ADL training;Therapeutic exercise;DME and/or AE instruction;Therapeutic activities;Patient/family education;Balance training    OT Goals(Current goals can be found in the care plan section) Acute Rehab OT Goals Patient Stated Goal: to get better OT Goal  Formulation: With patient/family Time For Goal Achievement: 11/12/23 Potential to Achieve Goals: Good  OT Frequency: Min 1X/week    Co-evaluation              AM-PAC OT "6 Clicks" Daily Activity     Outcome Measure Help from another person eating meals?: None Help from another person taking care of personal grooming?: A Little Help from another person toileting, which includes using toliet, bedpan, or urinal?: A Lot Help from another person bathing (including washing, rinsing, drying)?: A Lot Help from another person to put on and taking off regular upper body clothing?: A Little Help from another person to put on and taking off regular lower body clothing?: A Lot 6 Click Score: 16   End of Session Equipment Utilized During Treatment: Gait belt;Rolling walker (2 wheels) Nurse Communication: Mobility status  Activity Tolerance: Patient tolerated treatment well Patient left: in chair;with call bell/phone within reach;with chair alarm set;with family/visitor present  OT Visit Diagnosis: Unsteadiness on feet (R26.81);Other abnormalities of gait and mobility (R26.89);Muscle weakness (generalized) (M62.81);Pain Pain - part of body:  (back)                Time: 1610-9604 OT Time Calculation (min): 29 min Charges:  OT General Charges $OT Visit: 1 Visit OT Evaluation $OT Eval Moderate Complexity: 1 Mod OT Treatments $Self Care/Home Management : 8-22 mins  Skeet Simmer  Zareen Jamison, OT/L   Acute OT Clinical Specialist Acute Rehabilitation Services Pager [phone] Office [phone]   [facility] 10/29/2023, 1:53 PM

## 2023-10-29 NOTE — Plan of Care (Signed)

## 2023-10-29 NOTE — Evaluation (Signed)
SLP Cancellation Note  Patient Details Name: [name] MRN: [id number] DOB: [birth year]   Cancelled treatment:       Reason Eval/Treat Not Completed: Other (comment) (will make best effort to see pt for cog ling evaluation as soon as schedule permits, if unable prior to dc, pt may have SLP eval at next venue of care)  [name], MS [facility] SLP Acute Rehab Services Office [phone]  [name] 10/29/2023, 3:29 PM

## 2023-10-29 NOTE — Progress Notes (Signed)
Per family (wife and nephew) and patient request, all 4 side rails were placed up.

## 2023-10-29 NOTE — Plan of Care (Signed)
  Problem: Education: Goal: Individualized Educational Video(s) Outcome: Progressing   Problem: Coping: Goal: Ability to adjust to condition or change in health will improve Outcome: Progressing   Problem: Fluid Volume: Goal: Ability to maintain a balanced intake and output will improve Outcome: Progressing   Problem: Health Behavior/Discharge Planning: Goal: Ability to identify and utilize available resources and services will improve Outcome: Progressing Goal: Ability to manage health-related needs will improve Outcome: Progressing   Problem: Metabolic: Goal: Ability to maintain appropriate glucose levels will improve Outcome: Progressing   Problem: Nutritional: Goal: Maintenance of adequate nutrition will improve Outcome: Progressing Goal: Progress toward achieving an optimal weight will improve Outcome: Progressing   Problem: Skin Integrity: Goal: Risk for impaired skin integrity will decrease Outcome: Progressing   Problem: Tissue Perfusion: Goal: Adequacy of tissue perfusion will improve Outcome: Progressing   Problem: Education: Goal: Knowledge of General Education information will improve Description: Including pain rating scale, medication(s)/side effects and non-pharmacologic comfort measures Outcome: Progressing   Problem: Health Behavior/Discharge Planning: Goal: Ability to manage health-related needs will improve Outcome: Progressing   Problem: Clinical Measurements: Goal: Ability to maintain clinical measurements within normal limits will improve Outcome: Progressing Goal: Will remain free from infection Outcome: Progressing Goal: Diagnostic test results will improve Outcome: Progressing Goal: Respiratory complications will improve Outcome: Progressing Goal: Cardiovascular complication will be avoided Outcome: Progressing   Problem: Nutrition: Goal: Adequate nutrition will be maintained Outcome: Progressing   Problem: Coping: Goal: Level of  anxiety will decrease Outcome: Progressing   Problem: Elimination: Goal: Will not experience complications related to bowel motility Outcome: Progressing Goal: Will not experience complications related to urinary retention Outcome: Progressing   Problem: Pain Management: Goal: General experience of comfort will improve Outcome: Progressing   Problem: Safety: Goal: Ability to remain free from injury will improve Outcome: Progressing   Problem: Skin Integrity: Goal: Risk for impaired skin integrity will decrease Outcome: Progressing

## 2023-10-29 NOTE — NC FL2 (Signed)
[place] MEDICAID FL2 LEVEL OF CARE FORM     IDENTIFICATION  Patient Name: [name] Birthdate: 1934/11/20 Sex: male Admission Date (Current Location): 10/28/2023  [facility] and [place] Number:  Producer, television/film/video and Address:  [facility],  [address] [place], [place] [address]      Provider Number: [id number]  Attending Physician Name and Address:  Rodolph Bong, MD  Relative Name and Phone Number:  Aaden, Lonardo [phone]    Current Level of Care: Hospital Recommended Level of Care: Skilled Nursing Facility Prior Approval Number:    Date Approved/Denied:   PASRR Number: [id number]  Discharge Plan: SNF    Current Diagnoses: Patient Active Problem List   Diagnosis Date Noted   Acute renal failure superimposed on stage 3a chronic kidney disease (HCC) 10/28/2023   Acute metabolic encephalopathy 10/28/2023   Near syncope 10/28/2023   Physical deconditioning 10/28/2023   Transient hypotension 09/24/2023   Wound cellulitis after surgery 09/24/2023   S/P CABG (coronary artery bypass graft) 07/12/2023   Myalgia due to statin 07/12/2023   Chronic pain syndrome 09/08/2022   Lumbar radiculopathy 08/06/2022   History of renal cell cancer    Diabetes mellitus (HCC)    Prostate cancer (HCC)    Coronary artery disease    Chronic combined systolic and diastolic heart failure (HCC)    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   Discitis 12/22/2015   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   Sepsis secondary to UTI (HCC) 10/26/2015   CKD (chronic kidney disease), stage III (HCC) 10/26/2015   Essential hypertension 02/11/2015   Hyperlipidemia associated with type 2 diabetes mellitus (HCC)    GERD (gastroesophageal reflux disease)    H/O blood clots    Low back pain    Polymyalgia (HCC)    Gout    Glaucoma     Orientation RESPIRATION BLADDER Height & Weight     Self, Place  Normal Incontinent, External catheter Weight: 163 lb  2.3 oz (74 kg) Height:     BEHAVIORAL SYMPTOMS/MOOD NEUROLOGICAL BOWEL NUTRITION STATUS      Incontinent Diet (See DC summary)  AMBULATORY STATUS COMMUNICATION OF NEEDS Skin   Extensive Assist Verbally Surgical wounds (Incision - Open Vertebral column Medial)                       Personal Care Assistance Level of Assistance  Bathing, Feeding, Dressing Bathing Assistance: Limited assistance Feeding assistance: Independent Dressing Assistance: Limited assistance     Functional Limitations Info  Sight, Hearing, Speech Sight Info: Adequate Hearing Info: Impaired Speech Info: Adequate    SPECIAL CARE FACTORS FREQUENCY  PT (By licensed PT), OT (By licensed OT)     PT Frequency: 5x/wk OT Frequency: 5x/wk            Contractures Contractures Info: Not present    Additional Factors Info  Code Status, Allergies Code Status Info: FULL Allergies Info: Fish Allergy, Lipitor (Atorvastatin), Penicillins, Simvastatin, Adacel (Tetanus-diphth-acell Pertussis), Lasix (Furosemide), Septra (Sulfamethoxazole-trimethoprim), Flexeril (Cyclobenzaprine), Pantoprazole           Current Medications (10/29/2023):  This is the current hospital active medication list Current Facility-Administered Medications  Medication Dose Route Frequency Provider Last Rate Last Admin   0.9 %  sodium chloride infusion   Intravenous Continuous Rodolph Bong, MD 75 mL/hr at 10/29/23 1031 New Bag at 10/29/23 1031   acetaminophen (TYLENOL) tablet 650 mg  650 mg Oral Q6H PRN John Giovanni, MD  650 mg at 10/29/23 1024   Or   acetaminophen (TYLENOL) suppository 650 mg  650 mg Rectal Q6H PRN John Giovanni, MD       heparin injection 5,000 Units  5,000 Units Subcutaneous Q8H John Giovanni, MD   5,000 Units at 10/29/23 0556   insulin aspart (novoLOG) injection 0-5 Units  0-5 Units Subcutaneous QHS John Giovanni, MD       insulin aspart (novoLOG) injection 0-9 Units  0-9 Units Subcutaneous  TID WC John Giovanni, MD       sodium chloride flush (NS) 0.9 % injection 10 mL  10 mL Intravenous Q12H John Giovanni, MD   10 mL at 10/28/23 2209     Discharge Medications: Please see discharge summary for a list of discharge medications.  Relevant Imaging Results:  Relevant Lab Results:   Additional Information SSN [id number]  [name], LCSW

## 2023-10-29 NOTE — Evaluation (Signed)
Physical Therapy Evaluation Patient Details Name: [name] MRN: [id number] DOB: 04-13-1934 Today's Date: 10/29/2023  History of Present Illness  89 y.o. male with medical history significant of prostate cancer status post radiation, CKD stage II-IIIa, renal cell carcinoma status post nephrectomy, bladder cancer, hypertension, hyperlipidemia, CAD status post CABG, type 2 diabetes, chronic HFrEF, history of TAVR, chronic back pain on opiate, gout, GERD.  He was admitted to the hospital last month 10/3-10/10 for surgical wound infection of his back with cellulitis and also UTI.  He was also treated with midodrine for hypotension and it was discontinued prior to discharge.  Patient presents to the ED via EMS due to transient hypotension which resolved after 500 mL fluid bolus was given.  Patient complained of dysuria and generalized weakness x 1 week.  Family reported patient having confusion for the past few days and had a near syncopal episode. Dx of AKI on CKD, acute metabolic encephalopathy.  Clinical Impression  Pt admitted with above diagnosis. Pt reports that at baseline he's often in bed due to back and BLE pain, and that "some days I can get up, some days I can't". Pt became nauseous with supine to sit and reported he needed to lie back down. RN notified of nausea. Activity tolerance limited by nausea. Pt may need ST-SNF if family is not able to provide needed level of care, and depending on progress with mobility.  Pt currently with functional limitations due to the deficits listed below (see PT Problem List). Pt will benefit from acute skilled PT to increase their independence and safety with mobility to allow discharge.           If plan is discharge home, recommend the following: A lot of help with walking and/or transfers;A lot of help with bathing/dressing/bathroom;Assistance with cooking/housework;Assist for transportation;Help with stairs or ramp for entrance   Can travel by private  vehicle   No    Equipment Recommendations Wheelchair cushion (measurements PT);Wheelchair (measurements PT)  Recommendations for Other Services       Functional Status Assessment Patient has had a recent decline in their functional status and demonstrates the ability to make significant improvements in function in a reasonable and predictable amount of time.     Precautions / Restrictions Precautions Precautions: Fall Restrictions Weight Bearing Restrictions: No      Mobility  Bed Mobility Overal bed mobility: Needs Assistance Bed Mobility: Supine to Sit, Sit to Supine     Supine to sit: Modified independent (Device/Increase time), HOB elevated, Used rails Sit to supine: Min assist   General bed mobility comments: min A for BLEs into bed. Pt reported onset of nausea with supine to sit, he returned to supine. RN notified.    Transfers                   General transfer comment: deferred 2* nausea in sitting.    Ambulation/Gait                  Stairs            Wheelchair Mobility     Tilt Bed    Modified Rankin (Stroke Patients Only)       Balance Overall balance assessment: Needs assistance Sitting-balance support: Feet supported, No upper extremity supported Sitting balance-Leahy Scale: Fair  Pertinent Vitals/Pain Pain Assessment Pain Assessment: Faces Faces Pain Scale: Hurts even more Pain Location: back, R knee Pain Descriptors / Indicators: Grimacing, Guarding Pain Intervention(s): Limited activity within patient's tolerance, Monitored during session, Repositioned    Home Living Family/patient expects to be discharged to:: Private residence Living Arrangements: Spouse/significant other Available Help at Discharge: Family;Available 24 hours/day Type of Home: House Home Access: Stairs to enter   [employer] of Steps: 2   Home Layout: One level Home  Equipment: Agricultural consultant (2 wheels);Cane - single point;Shower seat;Grab bars - tub/shower;Grab bars - toilet      Prior Function               Mobility Comments: No AD used.  Pt with recent back surgery and mainly in bed due to back and BLE pain per his report. "Some days I can get up and some days I can't." ADLs Comments: wife assists     Extremity/Trunk Assessment   Upper Extremity Assessment Upper Extremity Assessment: Defer to OT evaluation    Lower Extremity Assessment Lower Extremity Assessment: RLE deficits/detail;LLE deficits/detail RLE: Unable to fully assess due to pain LLE: Unable to fully assess due to pain    Cervical / Trunk Assessment Cervical / Trunk Assessment: Kyphotic  Communication   Communication Communication: No apparent difficulties  Cognition Arousal: Alert Behavior During Therapy: WFL for tasks assessed/performed Overall Cognitive Status: No family/caregiver present to determine baseline cognitive functioning                                 General Comments: oriented to self, location, month but vague historian, somewhat flat affect        General Comments      Exercises     Assessment/Plan    PT Assessment Patient needs continued PT services  PT Problem List Decreased activity tolerance;Decreased mobility       PT Treatment Interventions Gait training;Functional mobility training;Therapeutic activities;Therapeutic exercise;Patient/family education    PT Goals (Current goals can be found in the Care Plan section)  Acute Rehab PT Goals Patient Stated Goal: decrease pain PT Goal Formulation: With patient Time For Goal Achievement: 11/12/23 Potential to Achieve Goals: Fair    Frequency Min 1X/week     Co-evaluation               AM-PAC PT "6 Clicks" Mobility  Outcome Measure Help needed turning from your back to your side while in a flat bed without using bedrails?: None Help needed moving from lying  on your back to sitting on the side of a flat bed without using bedrails?: A Little Help needed moving to and from a bed to a chair (including a wheelchair)?: A Lot Help needed standing up from a chair using your arms (e.g., wheelchair or bedside chair)?: A Lot Help needed to walk in hospital room?: Total Help needed climbing 3-5 steps with a railing? : Total 6 Click Score: 13    End of Session   Activity Tolerance: Treatment limited secondary to medical complications (Comment) (nausea) Patient left: in bed;with call bell/phone within reach;with bed alarm set Nurse Communication: Mobility status PT Visit Diagnosis: Difficulty in walking, not elsewhere classified (R26.2);Pain    Time: 1324-4010 PT Time Calculation (min) (ACUTE ONLY): 12 min   Charges:   PT Evaluation $PT Eval Moderate Complexity: 1 Mod   PT General Charges $$ ACUTE PT VISIT: 1 Visit  Ralene Bathe Kistler PT 10/29/2023  Acute Rehabilitation Services  Office [phone]

## 2023-10-29 NOTE — Care Management Obs Status (Signed)
MEDICARE OBSERVATION STATUS NOTIFICATION   Patient Details  Name: [name] MRN: [id number] Date of Birth: March 13, 1934   Medicare Observation Status Notification Given:  Yes    [name], LCSW 10/29/2023, 12:24 PM

## 2023-10-30 ENCOUNTER — Observation Stay (HOSPITAL_COMMUNITY): Payer: PPO

## 2023-10-30 DIAGNOSIS — Z87891 Personal history of nicotine dependence: Secondary | ICD-10-CM | POA: Diagnosis not present

## 2023-10-30 DIAGNOSIS — Z515 Encounter for palliative care: Secondary | ICD-10-CM | POA: Diagnosis not present

## 2023-10-30 DIAGNOSIS — I959 Hypotension, unspecified: Secondary | ICD-10-CM

## 2023-10-30 DIAGNOSIS — R319 Hematuria, unspecified: Secondary | ICD-10-CM

## 2023-10-30 DIAGNOSIS — R5381 Other malaise: Secondary | ICD-10-CM

## 2023-10-30 DIAGNOSIS — I10 Essential (primary) hypertension: Secondary | ICD-10-CM

## 2023-10-30 DIAGNOSIS — Z794 Long term (current) use of insulin: Secondary | ICD-10-CM

## 2023-10-30 DIAGNOSIS — I130 Hypertensive heart and chronic kidney disease with heart failure and stage 1 through stage 4 chronic kidney disease, or unspecified chronic kidney disease: Secondary | ICD-10-CM | POA: Diagnosis present

## 2023-10-30 DIAGNOSIS — I2510 Atherosclerotic heart disease of native coronary artery without angina pectoris: Secondary | ICD-10-CM | POA: Diagnosis present

## 2023-10-30 DIAGNOSIS — R55 Syncope and collapse: Secondary | ICD-10-CM

## 2023-10-30 DIAGNOSIS — E1122 Type 2 diabetes mellitus with diabetic chronic kidney disease: Secondary | ICD-10-CM | POA: Diagnosis present

## 2023-10-30 DIAGNOSIS — K219 Gastro-esophageal reflux disease without esophagitis: Secondary | ICD-10-CM | POA: Diagnosis present

## 2023-10-30 DIAGNOSIS — G9341 Metabolic encephalopathy: Secondary | ICD-10-CM

## 2023-10-30 DIAGNOSIS — J690 Pneumonitis due to inhalation of food and vomit: Secondary | ICD-10-CM | POA: Diagnosis not present

## 2023-10-30 DIAGNOSIS — R4182 Altered mental status, unspecified: Secondary | ICD-10-CM | POA: Diagnosis present

## 2023-10-30 DIAGNOSIS — E860 Dehydration: Secondary | ICD-10-CM | POA: Diagnosis present

## 2023-10-30 DIAGNOSIS — E1159 Type 2 diabetes mellitus with other circulatory complications: Secondary | ICD-10-CM | POA: Diagnosis not present

## 2023-10-30 DIAGNOSIS — I5022 Chronic systolic (congestive) heart failure: Secondary | ICD-10-CM | POA: Diagnosis present

## 2023-10-30 DIAGNOSIS — I700 Atherosclerosis of aorta: Secondary | ICD-10-CM | POA: Diagnosis present

## 2023-10-30 DIAGNOSIS — E871 Hypo-osmolality and hyponatremia: Secondary | ICD-10-CM | POA: Diagnosis present

## 2023-10-30 DIAGNOSIS — D631 Anemia in chronic kidney disease: Secondary | ICD-10-CM | POA: Diagnosis present

## 2023-10-30 DIAGNOSIS — N184 Chronic kidney disease, stage 4 (severe): Secondary | ICD-10-CM | POA: Diagnosis present

## 2023-10-30 DIAGNOSIS — Z952 Presence of prosthetic heart valve: Secondary | ICD-10-CM | POA: Diagnosis not present

## 2023-10-30 DIAGNOSIS — N400 Enlarged prostate without lower urinary tract symptoms: Secondary | ICD-10-CM | POA: Diagnosis present

## 2023-10-30 DIAGNOSIS — E8809 Other disorders of plasma-protein metabolism, not elsewhere classified: Secondary | ICD-10-CM | POA: Diagnosis present

## 2023-10-30 DIAGNOSIS — Z86718 Personal history of other venous thrombosis and embolism: Secondary | ICD-10-CM | POA: Diagnosis not present

## 2023-10-30 DIAGNOSIS — G928 Other toxic encephalopathy: Secondary | ICD-10-CM | POA: Diagnosis present

## 2023-10-30 DIAGNOSIS — E785 Hyperlipidemia, unspecified: Secondary | ICD-10-CM | POA: Diagnosis present

## 2023-10-30 DIAGNOSIS — Y929 Unspecified place or not applicable: Secondary | ICD-10-CM | POA: Diagnosis not present

## 2023-10-30 DIAGNOSIS — Z7984 Long term (current) use of oral hypoglycemic drugs: Secondary | ICD-10-CM | POA: Diagnosis not present

## 2023-10-30 DIAGNOSIS — R410 Disorientation, unspecified: Secondary | ICD-10-CM | POA: Diagnosis present

## 2023-10-30 DIAGNOSIS — N179 Acute kidney failure, unspecified: Secondary | ICD-10-CM | POA: Diagnosis present

## 2023-10-30 DIAGNOSIS — Z79899 Other long term (current) drug therapy: Secondary | ICD-10-CM | POA: Diagnosis not present

## 2023-10-30 DIAGNOSIS — Z1152 Encounter for screening for COVID-19: Secondary | ICD-10-CM | POA: Diagnosis not present

## 2023-10-30 LAB — IRON AND TIBC
Iron: 32 ug/dL — ABNORMAL LOW (ref 45–182)
Saturation Ratios: 14 % — ABNORMAL LOW (ref 17.9–39.5)
TIBC: 237 ug/dL — ABNORMAL LOW (ref 250–450)
UIBC: 205 ug/dL

## 2023-10-30 LAB — GLUCOSE, CAPILLARY
Glucose-Capillary: 133 mg/dL — ABNORMAL HIGH (ref 70–99)
Glucose-Capillary: 138 mg/dL — ABNORMAL HIGH (ref 70–99)
Glucose-Capillary: 145 mg/dL — ABNORMAL HIGH (ref 70–99)
Glucose-Capillary: 144 mg/dL — ABNORMAL HIGH (ref 70–99)

## 2023-10-30 LAB — URINE CULTURE: Culture: NO GROWTH

## 2023-10-30 LAB — CBC WITH DIFFERENTIAL/PLATELET
Abs Immature Granulocytes: 0.04 10*3/uL (ref 0.00–0.07)
Basophils Absolute: 0 10*3/uL (ref 0.0–0.1)
Basophils Relative: 0 %
Eosinophils Absolute: 0.1 10*3/uL (ref 0.0–0.5)
Eosinophils Relative: 1 %
HCT: 25 % — ABNORMAL LOW (ref 39.0–52.0)
Hemoglobin: 7.8 g/dL — ABNORMAL LOW (ref 13.0–17.0)
Immature Granulocytes: 0 %
Lymphocytes Relative: 24 %
Lymphs Abs: 2.4 10*3/uL (ref 0.7–4.0)
MCH: 30.5 pg (ref 26.0–34.0)
MCHC: 31.2 g/dL (ref 30.0–36.0)
MCV: 97.7 fL (ref 80.0–100.0)
Monocytes Absolute: 0.8 10*3/uL (ref 0.1–1.0)
Monocytes Relative: 8 %
Neutro Abs: 6.4 10*3/uL (ref 1.7–7.7)
Neutrophils Relative %: 67 %
Platelets: 309 10*3/uL (ref 150–400)
RBC: 2.56 MIL/uL — ABNORMAL LOW (ref 4.22–5.81)
RDW: 14.6 % (ref 11.5–15.5)
WBC: 9.7 10*3/uL (ref 4.0–10.5)
nRBC: 0 % (ref 0.0–0.2)

## 2023-10-30 LAB — BASIC METABOLIC PANEL
Anion gap: 9 (ref 5–15)
BUN: 18 mg/dL (ref 8–23)
CO2: 23 mmol/L (ref 22–32)
Calcium: 10.5 mg/dL — ABNORMAL HIGH (ref 8.9–10.3)
Chloride: 109 mmol/L (ref 98–111)
Creatinine, Ser: 1.16 mg/dL (ref 0.61–1.24)
GFR, Estimated: >60 mL/min (ref >60)
Glucose, Bld: 138 mg/dL — ABNORMAL HIGH (ref 70–99)
Potassium: 4 mmol/L (ref 3.5–5.1)
Sodium: 141 mmol/L (ref 135–145)

## 2023-10-30 LAB — BLOOD CULTURE ID PANEL (REFLEXED) - BCID2

## 2023-10-30 LAB — FERRITIN: Ferritin: 429 ng/mL — ABNORMAL HIGH (ref 24–336)

## 2023-10-30 LAB — FOLATE: Folate: 20.4 ng/mL (ref >5.9)

## 2023-10-30 LAB — VITAMIN B12: Vitamin B-12: 385 pg/mL (ref 180–914)

## 2023-10-30 MED ORDER — FUROSEMIDE 10 MG/ML IJ SOLN
20.0000 mg | Freq: Once | INTRAMUSCULAR | Status: AC
  Administered 2023-10-30: 20 mg via INTRAVENOUS
  Filled 2023-10-30: qty 2

## 2023-10-30 MED ORDER — SODIUM CHLORIDE 0.9 % IV SOLN: INTRAVENOUS | Status: DC

## 2023-10-30 MED ORDER — MIDODRINE HCL 5 MG PO TABS
5.0000 mg | ORAL_TABLET | Freq: Three times a day (TID) | ORAL | Status: DC
  Administered 2023-10-30 – 2023-11-01 (×6): 5 mg via ORAL
  Filled 2023-10-30 (×6): qty 1

## 2023-10-30 NOTE — Progress Notes (Signed)
   10/30/23 1938  Assess: MEWS Score  Temp 97.6 F (36.4 C)  BP (!) 97/51  MAP (mmHg) 66  Pulse Rate (!) 115  Resp 16  Level of Consciousness Alert  SpO2 97 %  O2 Device Room Air  Assess: MEWS Score  MEWS Temp 0  MEWS Systolic 1  MEWS Pulse 2  MEWS RR 0  MEWS LOC 0  MEWS Score 3  MEWS Score Color Yellow  Provider Notification  Provider Name/Title Garner Nash  Date Provider Notified 10/30/23  Time Provider Notified 1955  Method of Notification Page  Notification Reason Other (Comment) (Yellow mews)  Provider response No new orders  Date of Provider Response 10/30/23  Time of Provider Response 2000  Notify: Rapid Response  Name of Rapid Response RN Notified nA  Assess: SIRS CRITERIA  SIRS Temperature  0  SIRS Pulse 1  SIRS Respirations  0  SIRS WBC 0  SIRS Score Sum  1   Meds due/ administered cont to monitor.

## 2023-10-30 NOTE — Progress Notes (Signed)
PHARMACY - PHYSICIAN COMMUNICATION CRITICAL VALUE ALERT - BLOOD CULTURE IDENTIFICATION (BCID)  [name] is an 89 y.o. male who presented to [facility] on 10/28/2023 with a chief complaint of hypotension, dysuria and generalized weakness.   Assessment:   Admit with acute kidney injury/hematuria.  No infectious source noted and he is currently not on antibiotics. VSS, Scr trending down.  Aerobic bottle of 1 blood cx set now + GPC; BCID + methicillin resistant staph epi.  This could be contaminant since pt improving off abx.   Name of physician (or Provider) Contacted: Liana Crocker, NP  Current antibiotics: none  Changes to prescribed antibiotics recommended:  Continue to monitor clinical course off antibiotics. Consider repeat blood cx F/U final cx data  Results for orders placed or performed during the hospital encounter of 10/28/23  Blood Culture ID Panel (Reflexed) (Collected: 10/28/2023 12:50 PM)  Result Value Ref Range   Enterococcus faecalis NOT DETECTED NOT DETECTED   Enterococcus Faecium NOT DETECTED NOT DETECTED   Listeria monocytogenes NOT DETECTED NOT DETECTED   Staphylococcus species DETECTED (A) NOT DETECTED   Staphylococcus aureus (BCID) NOT DETECTED NOT DETECTED   Staphylococcus epidermidis DETECTED (A) NOT DETECTED   Staphylococcus lugdunensis NOT DETECTED NOT DETECTED   Streptococcus species NOT DETECTED NOT DETECTED   Streptococcus agalactiae NOT DETECTED NOT DETECTED   Streptococcus pneumoniae NOT DETECTED NOT DETECTED   Streptococcus pyogenes NOT DETECTED NOT DETECTED   A.calcoaceticus-baumannii NOT DETECTED NOT DETECTED   Bacteroides fragilis NOT DETECTED NOT DETECTED   Enterobacterales NOT DETECTED NOT DETECTED   Enterobacter cloacae complex NOT DETECTED NOT DETECTED   Escherichia coli NOT DETECTED NOT DETECTED   Klebsiella aerogenes NOT DETECTED NOT DETECTED   Klebsiella oxytoca NOT DETECTED NOT DETECTED   Klebsiella pneumoniae NOT DETECTED NOT DETECTED    Proteus species NOT DETECTED NOT DETECTED   Salmonella species NOT DETECTED NOT DETECTED   Serratia marcescens NOT DETECTED NOT DETECTED   Haemophilus influenzae NOT DETECTED NOT DETECTED   Neisseria meningitidis NOT DETECTED NOT DETECTED   Pseudomonas aeruginosa NOT DETECTED NOT DETECTED   Stenotrophomonas maltophilia NOT DETECTED NOT DETECTED   Candida albicans NOT DETECTED NOT DETECTED   Candida auris NOT DETECTED NOT DETECTED   Candida glabrata NOT DETECTED NOT DETECTED   Candida krusei NOT DETECTED NOT DETECTED   Candida parapsilosis NOT DETECTED NOT DETECTED   Candida tropicalis NOT DETECTED NOT DETECTED   Cryptococcus neoformans/gattii NOT DETECTED NOT DETECTED   Methicillin resistance mecA/C DETECTED (A) NOT DETECTED    Junita Push PharmD 10/30/2023  12:30 AM

## 2023-10-30 NOTE — Evaluation (Signed)
Speech Language Pathology Evaluation Patient Details Name: [name] MRN: [id number] DOB: 1934/07/08 Today's Date: 10/30/2023 Time: 4098-1191 SLP Time Calculation (min) (ACUTE ONLY): 29 min  Problem List:  Patient Active Problem List   Diagnosis Date Noted   Hematuria 10/29/2023   Acute renal failure superimposed on stage 3a chronic kidney disease (HCC) 10/28/2023   Acute metabolic encephalopathy 10/28/2023   Near syncope 10/28/2023   Physical deconditioning 10/28/2023   Transient hypotension 09/24/2023   Wound cellulitis after surgery 09/24/2023   S/P CABG (coronary artery bypass graft) 07/12/2023   Myalgia due to statin 07/12/2023   Chronic pain syndrome 09/08/2022   Lumbar radiculopathy 08/06/2022   History of renal cell cancer    Diabetes mellitus (HCC)    Prostate cancer (HCC)    Coronary artery disease    Chronic combined systolic and diastolic heart failure (HCC)    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   Discitis 12/22/2015   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   Sepsis secondary to UTI (HCC) 10/26/2015   CKD (chronic kidney disease), stage III (HCC) 10/26/2015   Essential hypertension 02/11/2015   Hyperlipidemia associated with type 2 diabetes mellitus (HCC)    GERD (gastroesophageal reflux disease)    H/O blood clots    Low back pain    Polymyalgia (HCC)    Gout    Glaucoma    Past Medical History:  Past Medical History:  Diagnosis Date   Anemia due to chronic kidney disease    Benign localized prostatic hyperplasia with lower urinary tract symptoms (LUTS)    Bladder cancer (HCC) 04/2023   dr dahlstedt   Carpal tunnel syndrome on both sides    Chronic constipation    Chronic gout without tophus    followed by pcp    (05-07-2023  per pt last flare-up 6-8 months ago, usually affects bilateral great toes)   Chronic low back pain    per pt s/p RFA's   Chronic narcotic use    Chronic systolic CHF (congestive heart failure) (HCC)     followed by cardiology;  preserved ef   CKD (chronic kidney disease), stage III (HCC) 2017   previously seen by nephrologsit--- dr Hyman Hopes   Coronary artery disease 1993   cardiologist--- dr h. Katrinka Blazing;   1993-- s/p cath w/ PTCA to occluded RCA;  a. 2007: s/p CABG x4 (LIMA--> LAD, rSVG--> RI, rSVG-seq -->dRCA and PLA))  c. 2017: LHC with 2/3 patent bypass grafts with occuluded SVG--> RI   DDD (degenerative disc disease), lumbosacral    GERD (gastroesophageal reflux disease)    History of adenomatous polyp of colon    History of cardiomyopathy 09/2016   in setting severe aortic stenosis and new acute on chronic systolic CHF, ef 47-82%   History of DVT of lower extremity 2008   superficial thrombophlebitis-- R Lower leg, right upper leg   History of polymyalgia rheumatica 2011   per pt hx was treated and resolved after being dx approx 2011   History of prostate cancer 2009   urologist--- dr Retta Diones;   dx 2009,  Gleason 4+3;   02-08-2009  s/p radioactive prostate seed implants by dr Vonita Moss  (05-07-2023  PSA undetectable)   History of renal cell cancer 2014   primary urologist-- dr Frederich Cha;   incidental finding on imaging for back , left renal mass;   08-31-2013  s/p partial left nephrecotmy   History of septic shock 2016   post op  lumbar back surgery, positive blood culture  (  discitis/ ostomyelitis)   Hyperlipidemia    Hypertension    Peripheral neuropathy    hands and feet   Pulmonary nodule, right    solitary ,  last Chest CT in epic 12-08-2018 stable   S/P aortic valve replacement with prosthetic valve 07/02/2017   @[facility]  by Dr Laneta Simmers w/ [employer] prosthesis  for severe aortic valve stenosis  (nonrheumatic)   Secondary hyperparathyroidism of renal origin (HCC)    Type 2 diabetes mellitus (HCC)    followed by pcp;   (05-07-2023  per pt only check blood sugar occasionlly)   Wears partial dentures    upper   Past Surgical History:  Past Surgical History:  Procedure Laterality Date    APPENDECTOMY     child   CARDIAC CATHETERIZATION N/A 11/03/2016   Procedure: Right/Left Heart Cath and Coronary/Graft Angiography;  Surgeon: Lyn Records, MD;  Location: [facility] INVASIVE CV LAB;  Service: Cardiovascular;  Laterality: N/A;   CARDIAC CATHETERIZATION  03/30/2006   @[facility]  by dr h. Katrinka Blazing;   severe coronary artery disease   CATARACT EXTRACTION W/ INTRAOCULAR LENS IMPLANT Bilateral    2015/ 2019   CORONARY ANGIOPLASTY  1993   @[facility]  by dr h. Katrinka Blazing;   PTCA to total occluded RCA   CORONARY ARTERY BYPASS GRAFT  03/31/2006   @[facility]  by dr gerhardt;   x4--  LIMA--LAD/  rSVG--RI/   rSVG seq -- dRCA and PLB of RCA   INSERTION PROSTATE RADIATION SEED  02/08/2009   @[facility]  by dr Vonita Moss;   radioactive prostate seed implants for prostate cancer   LEFT HEART CATH AND CORS/GRAFTS ANGIOGRAPHY N/A 09/01/2023   Procedure: LEFT HEART CATH AND CORS/GRAFTS ANGIOGRAPHY;  Surgeon: Corky Crafts, MD;  Location: [facility] INVASIVE CV LAB;  Service: Cardiovascular;  Laterality: N/A;   LUMBAR SPINE SURGERY  10/02/2015   dr Dutch Quint;   L3--L5   ROBOTIC ASSITED PARTIAL NEPHRECTOMY Left 08/31/2013   Procedure: ROBOTIC ASSITED PARTIAL NEPHRECTOMY;  Surgeon: Crecencio Mc, MD;  Location: [facility] ORS;  Service: Urology;  Laterality: Left;   TEE WITHOUT CARDIOVERSION N/A 06/22/2017   Procedure: TRANSESOPHAGEAL ECHOCARDIOGRAM (TEE);  Surgeon: Tonny Bollman, MD;  Location: [facility] OR;  Service: Open Heart Surgery;  Laterality: N/A;   TRANSCATHETER AORTIC VALVE REPLACEMENT, TRANSFEMORAL N/A 06/22/2017   Procedure: TRANSCATHETER AORTIC VALVE REPLACEMENT, TRANSFEMORAL;  Surgeon: Tonny Bollman, MD;  Location: [facility] OR;  Service: Open Heart Surgery;  Laterality: N/A;   VEIN LIGATION AND STRIPPING Right 1972   right lower leg   HPI:  [name] is a 89 y.o. male with medical history significant of prostate cancer status post radiation, CKD stage II-IIIa, renal cell carcinoma status post nephrectomy, bladder cancer, hypertension,  hyperlipidemia, CAD status post CABG, type 2 diabetes, chronic HFrEF, history of TAVR, chronic back pain on opiate, gout, GERD.  He was admitted to the hospital last month 10/3-10/10 for surgical wound infection of his back with cellulitis and also UTI.  He was also treated with midodrine for hypotension and it was discontinued prior to discharge.  Patient presents to the ED today via EMS due to transient hypotension which resolved after 500 mL fluid bolus was given.  Patient complained of dysuria and generalized weakness x 1 week.  Family reported patient having confusion for the past few days and had a near syncopal episode yesterday.  They reported patient having decreased p.o. intake for several days.  CT head unremarkable. intermittent confusion reported by family when pt was enrolled in rehab at [facility].  Assessment / Plan / Recommendation Clinical Impression  Pt seen for a speech/language assessment with administration of [employer] Mental Status Examination (SLUMS) completed with a score obtained of 16/30 (normal score on this assessment is 27/30) with deficits noted within the areas of memory retrieval, retrieval of novel information, auditory comprehension of paragraph (75%), and decreased sustained attention with tasks such as simple calculation and digit reversal past 2 digits.  Pt oriented to self, place and time, but not situation.  Naming tasks were impaired with divergent/convergent tasks despite categorization cues provided.  Spatial organization/inattention impacted clock formation task.  Speech intelligible within simple conversation and OME unremarkable.Unsure of pt cognitive function at baseline, but ongoing cognitive reorganization/tx with ST recommended during acute stay and when d/c to next venue.  Thank you for this consult.    SLP Assessment  SLP Recommendation/Assessment: Patient needs continued Speech Language Pathology Services SLP Visit Diagnosis: Cognitive communication  deficit (R41.841);Attention and concentration deficit Attention and concentration deficit following: Other cerebrovascular disease    Recommendations for follow up therapy are one component of a multi-disciplinary discharge planning process, led by the attending physician.  Recommendations may be updated based on patient status, additional functional criteria and insurance authorization.    Follow Up Recommendations  Skilled nursing-short term rehab (<3 hours/day)    Assistance Recommended at Discharge  Frequent or constant Supervision/Assistance  Functional Status Assessment Patient has had a recent decline in their functional status and demonstrates the ability to make significant improvements in function in a reasonable and predictable amount of time.  Frequency and Duration min 1 x/week  1 week      SLP Evaluation Cognition  Overall Cognitive Status: Impaired/Different from baseline Arousal/Alertness: Awake/alert Orientation Level: Oriented to person;Oriented to place;Oriented to time;Disoriented to situation Year: 2024 Month: November Day of Week: Correct Attention: Sustained Sustained Attention: Impaired Sustained Attention Impairment: Verbal basic;Functional basic Memory: Impaired Memory Impairment: Decreased short term memory;Decreased recall of new information;Retrieval deficit Decreased Short Term Memory: Verbal basic;Functional basic Problem Solving: Impaired Problem Solving Impairment: Verbal basic;Functional basic Executive Function: Landscape architect: Impaired Organizing Impairment: Verbal basic;Functional basic Behaviors: Perseveration       Comprehension  Auditory Comprehension Overall Auditory Comprehension: Impaired Commands: Not tested Conversation: Simple Interfering Components: Attention;Working memory EffectiveTechniques: Repetition;Extra processing time Visual Recognition/Discrimination Discrimination: Not tested Reading Comprehension Reading  Status: Not tested    Expression Expression Primary Mode of Expression: Verbal Verbal Expression Overall Verbal Expression: Impaired Level of Generative/Spontaneous Verbalization: Conversation Repetition: No impairment Naming: Impairment Responsive: 76-100% accurate Confrontation: Within functional limits Convergent: 50-74% accurate Divergent: 25-49% accurate Verbal Errors: Perseveration Pragmatics: Unable to assess Interfering Components: Attention;Premorbid deficit Non-Verbal Means of Communication: Not applicable Written Expression Dominant Hand: Right Written Expression: Exceptions to [facility] Interfering Components: Thought organization;Attention   Oral / Motor  Oral Motor/Sensory Function Overall Oral Motor/Sensory Function: Within functional limits Motor Speech Overall Motor Speech: Appears within functional limits for tasks assessed Respiration: Within functional limits Phonation: Normal Resonance: Within functional limits Articulation: Within functional limitis Intelligibility: Intelligible Motor Planning: Witnin functional limits Motor Speech Errors: Not applicable Interfering Components: Premorbid status            Pat Elizibeth Breau,M.S.,CCC-SLP 10/30/2023, 1:56 PM

## 2023-10-30 NOTE — Plan of Care (Signed)
Pt remain confused with High HR and RR, brown colored urine throughout the shift. Problem: Education: Goal: Ability to describe self-care measures that may prevent or decrease complications (Diabetes Survival Skills Education) will improve Outcome: Not Progressing Goal: Individualized Educational Video(s) Outcome: Not Progressing   Problem: Coping: Goal: Ability to adjust to condition or change in health will improve Outcome: Not Progressing   Problem: Fluid Volume: Goal: Ability to maintain a balanced intake and output will improve Outcome: Not Progressing   Problem: Health Behavior/Discharge Planning: Goal: Ability to identify and utilize available resources and services will improve Outcome: Not Progressing Goal: Ability to manage health-related needs will improve Outcome: Not Progressing   Problem: Metabolic: Goal: Ability to maintain appropriate glucose levels will improve Outcome: Not Progressing

## 2023-10-30 NOTE — Plan of Care (Signed)
Pt continue to be confused, Yellow MEWS HR and RR. Urine browwn through out the shift Problem: Education: Goal: Ability to describe self-care measures that may prevent or decrease complications (Diabetes Survival Skills Education) will improve 10/30/2023 0748 by Pauline Aus, RN Outcome: Not Progressing 10/30/2023 0747 by Pauline Aus, RN Outcome: Not Progressing Goal: Individualized Educational Video(s) 10/30/2023 0748 by Pauline Aus, RN Outcome: Not Progressing 10/30/2023 0747 by Pauline Aus, RN Outcome: Not Progressing   Problem: Education: Goal: Individualized Educational Video(s) 10/30/2023 0748 by Pauline Aus, RN Outcome: Not Progressing 10/30/2023 0747 by Pauline Aus, RN Outcome: Not Progressing   Problem: Coping: Goal: Ability to adjust to condition or change in health will improve 10/30/2023 0748 by Pauline Aus, RN Outcome: Not Progressing 10/30/2023 0747 by Pauline Aus, RN Outcome: Not Progressing   Problem: Fluid Volume: Goal: Ability to maintain a balanced intake and output will improve 10/30/2023 0748 by Pauline Aus, RN Outcome: Not Progressing 10/30/2023 0747 by Pauline Aus, RN Outcome: Not Progressing

## 2023-10-30 NOTE — Progress Notes (Signed)
PROGRESS NOTE    [name]  ELF:[id number] DOB: 1934-04-09 DOA: 10/28/2023 PCP: [name], MD   Chief Complaint  Patient presents with   Hypotension    Brief Narrative:  Patient 89 year old gentleman history of prostate cancer status post radiation, CKD stage II-3A, renal cell carcinoma status post nephrectomy, bladder cancer, hypertension, hyperlipidemia, CAD status post CABG, type 2 diabetes, chronic HFrEF, history of TAVR, chronic back pain on chronic opiates, gout, GERD recently admitted 09/23/2023-09/30/2023 for surgical wound infection of the back with cellulitis and UTI.  Patient also noted to be hypotensive during that hospitalization and treated with midodrine which was discontinued prior to discharge.  Patient presented to the ED with transient hypotension which responded to IV fluids, dysuria, generalized weakness x 1 week.  Family also with concerns for confusion over the past few days and near syncopal episode.  Patient noted to have had decreased oral intake.  Patient seen in the ED, placed on IV fluids, COVID-19 PCR, influenza AMB PCR, RSV PCR negative.  Blood cultures obtained.  Urinalysis also obtained as well as urine cultures.  Patient also noted to be in acute kidney injury.  Head CT negative.  Patient given Percocet, vancomycin, cefepime, metronidazole in the ED and patient admitted for further evaluation and management.   Assessment & Plan:   Principal Problem:   Acute renal failure superimposed on stage 3a chronic kidney disease (HCC) Active Problems:   Essential hypertension   Diabetes mellitus (HCC)   Transient hypotension   Acute metabolic encephalopathy   Near syncope   Physical deconditioning   Hematuria   AMS (altered mental status)  #1 AKI on CKD stage II-IIIa -Blood secondary to a prerenal azotemia in the setting of poor oral intake, dehydration and diuretics. -Creatinine on admission noted at 1.5 with baseline 1.0-1.2. -Urinalysis done with trace  leukocytes, nitrite negative, 11-20 WBCs. -Urine cultures obtained. -Patient on IV fluids urine output of 1.050 L over the past 24 hours. -Renal function slowly trending down creatinine at 1.16. -Continue to hold torsemide. -Patient with cough over the past 24 hours with concerns for possible volume overload.  Saline lock IVF. -Lasix 20 mg IV x 1.  2.  Hematuria -Per RN patient with some hematuria this afternoon. -Patient with complaints of back pain however also does have chronic back pain. -CT renal stone protocol pending.  -If continued hematuria will place a Foley catheter and may need irrigation. -Supportive care.  3.  Acute metabolic encephalopathy -Per family patient noted to have some confusion. -Likely multifactorial secondary to dehydration in the setting of chronic opioid use for back pain. -Patient received IV antibiotics in the ED, no obvious infection noted at this time as patient with no fever, no leukocytosis, no meningeal signs. -Patient with no respiratory symptoms, chest x-ray negative for any acute infiltrates. -Urinalysis not strongly suggestive of UTI with urine cultures pending. -Patient with no signs of infection of lower back surgical wound site at this time. -CT head negative for any acute abnormalities. -Patient with recent labs a month ago with TSH, vitamin B12, B1 levels within normal limits. -Ammonia levels within normal limits. -Initial blood cultures drawn on admission with MRSE likely contaminant. -Repeat blood cultures x 2. -Patient improving clinically with hydration. -Minimize narcotics. -Supportive care.  4.  Transient hypotension -Patient presentation noted to have a transient hypotension which responded to IV fluids. -Diuretics of torsemide on hold. -BP fluctuating noted to have been on midodrine before in the past. -Concern for volume overload  and as such saline lock IV fluids. -May need to resume midodrine.  5.  Near syncope -As noted  per admitting physician (ED report family reported near syncopal episode the day prior to admission likely secondary to dehydration and hypotension. -CT head with no acute abnormalities. -Patient with no acute focal neurological deficits. -Patient not hypoxic with no shortness of breath and low probability for PE. -Clinical improvement with hydration.  6.  Generalized weakness/physical deconditioning -PT/OT.  7.  Hypertension -Continue to hold Toprol-XL, torsemide.  8.  Diabetes mellitus type 2 -Hemoglobin A1c 6.4 (10/28/2023) -CBG 133 this morning. -Hold oral hypoglycemic agents. -SSI.  9.  Chronic back pain -Patient noted to be on chronic opioids for a while for his chronic back pain. -Due to concerns opioid may be contributing to patient's altered mental status on presentation and as such patient placed on oxycodone 5 mg every 4 hours as needed pain.   -Continue scheduled Tylenol 1000 mg 3 times daily.   -Follow  10.  Chronic HFrEF -2D echo from 08/05/2023 with a EF of 30%, grade 1 diastolic dysfunction, right ventricular systolic function moderately reduced, mild MVR, trivial AVR.  Patient with no signs of volume overload on exam however patient with coughing over the past 24 hours. -Diuretics held on admission. -Saline lock IV fluids. -Lasix 20 mg IV x 1..  11.  Normocytic anemia -Patient noted with some hematuria per RN. -Hemoglobin currently stable at 7.8. -Anemia panel with iron of 32, TIBC of 237, ferritin of 429, folate of 20.4, vitamin B12 of 385. -Follow H&H.  12.  GERD -Continue Pepcid  13.  CAD status post CABG -Stable. -Continue to hold home regimen aspirin in light of concern for hematuria. -Due to transient hypotension on admission continue to hold Toprol-XL and Demadex.    DVT prophylaxis: Heparin>>>> SCDs Code Status: Full Family Communication: Updated patient, wife, niece at bedside. Disposition:   Status is: Observation The patient remains OBS  appropriate and will d/c before 2 midnights.   Consultants:  None  Procedures:  CT head 10/28/2023 Chest x-ray 10/28/2023 CT renal stone protocol pending  Antimicrobials:  Anti-infectives (From admission, onward)    Start     Dose/Rate Route Frequency Ordered Stop   10/28/23 1300  vancomycin (VANCOREADY) IVPB 1500 mg/300 mL        1,500 mg 150 mL/hr over 120 Minutes Intravenous  Once 10/28/23 1240 10/28/23 1900   10/28/23 1245  ceFEPIme (MAXIPIME) 2 g in sodium chloride 0.9 % 100 mL IVPB        2 g 200 mL/hr over 30 Minutes Intravenous  Once 10/28/23 1236 10/28/23 1513   10/28/23 1245  metroNIDAZOLE (FLAGYL) IVPB 500 mg        500 mg 100 mL/hr over 60 Minutes Intravenous  Once 10/28/23 1236 10/28/23 1406   10/28/23 1245  vancomycin (VANCOCIN) IVPB 1000 mg/200 mL premix  Status:  Discontinued        1,000 mg 200 mL/hr over 60 Minutes Intravenous  Once 10/28/23 1236 10/28/23 1239         Subjective: Patient laying in bed.  Wife and wife's niece at bedside.  Patient alert and oriented to self place and time.  Patient denies any chest pain.  No significant shortness of breath.  Per family at bedside patient with increasing cough over the past 24 hours.  Urine still looks dark.  Objective: Vitals:   10/30/23 0802 10/30/23 1115 10/30/23 1117 10/30/23 1557  BP: (!) 90/53 102/60  109/62  Pulse: (!) 114   (!) 101  Resp: 20   18  Temp: 98.7 F (37.1 C)  97.9 F (36.6 C) 98 F (36.7 C)  TempSrc: Oral  Oral   SpO2: 96%   96%  Weight:      Height:        Intake/Output Summary (Last 24 hours) at 10/30/2023 1824 Last data filed at 10/30/2023 1245 Gross per 24 hour  Intake 700 ml  Output 700 ml  Net 0 ml   Filed Weights   10/28/23 1247  Weight: 74 kg    Examination:  General exam: NAD. Respiratory system: Coarse diffuse breath sounds.  No crackles.  Fair air movement.  Speaking in full sentences.  Cardiovascular system: Regular rate rhythm no murmurs rubs or gallops.  No  JVD.  No lower extremity edema.  Gastrointestinal system: Abdomen is soft, nontender, nondistended, positive bowel sounds.  No rebound.  No guarding.  Central nervous system: Alert and oriented. No focal neurological deficits. Extremities: Symmetric 5 x 5 power. Skin: No rashes, lesions or ulcers Psychiatry: Judgement and insight appear normal. Mood & affect appropriate.     Data Reviewed: I have personally reviewed following labs and imaging studies  CBC: Recent Labs  Lab 10/28/23 1227 10/29/23 0604 10/30/23 0708  WBC 10.0 9.1 9.7  NEUTROABS 6.6  --  6.4  HGB 8.4* 8.1* 7.8*  HCT 27.3* 26.6* 25.0*  MCV 98.2 98.9 97.7  PLT 328 320 309    Basic Metabolic Panel: Recent Labs  Lab 10/28/23 1227 10/29/23 0604 10/30/23 0708  NA 136 138 141  K 3.6 3.6 4.0  CL 101 104 109  CO2 26 25 23   GLUCOSE 147* 125* 138*  BUN 32* 28* 18  CREATININE 1.56* 1.38* 1.16  CALCIUM 10.3 10.6* 10.5*  MG  --  2.0  --     GFR: Estimated Creatinine Clearance: 40.5 mL/min (by C-G formula based on SCr of 1.16 mg/dL).  Liver Function Tests: Recent Labs  Lab 10/28/23 1227  AST 16  ALT 8  ALKPHOS 49  BILITOT 0.4  PROT 6.6  ALBUMIN 2.4*    CBG: Recent Labs  Lab 10/29/23 1643 10/29/23 2150 10/30/23 0753 10/30/23 1158 10/30/23 1650  GLUCAP 123* 138* 133* 138* 145*     Recent Results (from the past 240 hour(s))  Resp panel by RT-PCR (RSV, Flu A&B, Covid) Anterior Nasal Swab     Status: None   Collection Time: 10/28/23 12:45 PM   Specimen: Anterior Nasal Swab  Result Value Ref Range Status   SARS Coronavirus 2 by RT PCR NEGATIVE NEGATIVE Final    Comment: (NOTE) SARS-CoV-2 target nucleic acids are NOT DETECTED.  The SARS-CoV-2 RNA is generally detectable in upper respiratory specimens during the acute phase of infection. The lowest concentration of SARS-CoV-2 viral copies this assay can detect is 138 copies/mL. A negative result does not preclude SARS-Cov-2 infection and should  not be used as the sole basis for treatment or other patient management decisions. A negative result may occur with  improper specimen collection/handling, submission of specimen other than nasopharyngeal swab, presence of viral mutation(s) within the areas targeted by this assay, and inadequate number of viral copies(<138 copies/mL). A negative result must be combined with clinical observations, patient history, and epidemiological information. The expected result is Negative.  Fact Sheet for Patients:  BloggerCourse.com  Fact Sheet for Healthcare Providers:  SeriousBroker.it  This test is no t yet approved or cleared by the [place]  FDA and  has been authorized for detection and/or diagnosis of SARS-CoV-2 by FDA under an Emergency Use Authorization (EUA). This EUA will remain  in effect (meaning this test can be used) for the duration of the COVID-19 declaration under Section 564(b)(1) of the Act, 21 U.S.C.section 360bbb-3(b)(1), unless the authorization is terminated  or revoked sooner.       Influenza A by PCR NEGATIVE NEGATIVE Final   Influenza B by PCR NEGATIVE NEGATIVE Final    Comment: (NOTE) The Xpert Xpress SARS-CoV-2/FLU/RSV plus assay is intended as an aid in the diagnosis of influenza from Nasopharyngeal swab specimens and should not be used as a sole basis for treatment. Nasal washings and aspirates are unacceptable for Xpert Xpress SARS-CoV-2/FLU/RSV testing.  Fact Sheet for Patients: BloggerCourse.com  Fact Sheet for Healthcare Providers: SeriousBroker.it  This test is not yet approved or cleared by the [place] FDA and has been authorized for detection and/or diagnosis of SARS-CoV-2 by FDA under an Emergency Use Authorization (EUA). This EUA will remain in effect (meaning this test can be used) for the duration of the COVID-19 declaration under  Section 564(b)(1) of the Act, 21 U.S.C. section 360bbb-3(b)(1), unless the authorization is terminated or revoked.     Resp Syncytial Virus by PCR NEGATIVE NEGATIVE Final    Comment: (NOTE) Fact Sheet for Patients: BloggerCourse.com  Fact Sheet for Healthcare Providers: SeriousBroker.it  This test is not yet approved or cleared by the [place] FDA and has been authorized for detection and/or diagnosis of SARS-CoV-2 by FDA under an Emergency Use Authorization (EUA). This EUA will remain in effect (meaning this test can be used) for the duration of the COVID-19 declaration under Section 564(b)(1) of the Act, 21 U.S.C. section 360bbb-3(b)(1), unless the authorization is terminated or revoked.  Performed at [facility], [address] [place], [place] [address]   Blood Culture (routine x 2)     Status: None (Preliminary result)   Collection Time: 10/28/23 12:50 PM   Specimen: BLOOD  Result Value Ref Range Status   Specimen Description   Final    BLOOD SITE NOT SPECIFIED Performed at [facility], [address] [place], [place] [address]    Special Requests   Final    BOTTLES DRAWN AEROBIC AND ANAEROBIC Blood Culture results may not be optimal due to an inadequate volume of blood received in culture bottles Performed at [facility], [address] [place], [place] [address]    Culture  Setup Time   Final    GRAM POSITIVE COCCI BOTTLES DRAWN AEROBIC ONLY Organism ID to follow CRITICAL RESULT CALLED TO, READ BACK BY AND VERIFIED WITH: M LILLISTON,[email or web address]  10/30/23 MK    Culture   Final    GRAM POSITIVE COCCI CULTURE REINCUBATED FOR BETTER GROWTH Performed at [facility], [address], [place], [place] [address]    Report Status PENDING  Incomplete  Blood Culture ID Panel (Reflexed)     Status: Abnormal   Collection Time: 10/28/23 12:50 PM  Result  Value Ref Range Status   Enterococcus faecalis NOT DETECTED NOT DETECTED Final   Enterococcus Faecium NOT DETECTED NOT DETECTED Final   Listeria monocytogenes NOT DETECTED NOT DETECTED Final   Staphylococcus species DETECTED (A) NOT DETECTED Final    Comment: CRITICAL RESULT CALLED TO, READ BACK BY AND VERIFIED WITH: M LILLISTON,[email or web address]  10/30/23 MK    Staphylococcus aureus (BCID) NOT DETECTED NOT DETECTED Final   Staphylococcus epidermidis DETECTED (A)  NOT DETECTED Final    Comment: Methicillin (oxacillin) resistant coagulase negative staphylococcus. Possible blood culture contaminant (unless isolated from more than one blood culture draw or clinical case suggests pathogenicity). No antibiotic treatment is indicated for blood  culture contaminants. CRITICAL RESULT CALLED TO, READ BACK BY AND VERIFIED WITH: M LILLISTON,[email or web address]  10/30/23 MK    Staphylococcus lugdunensis NOT DETECTED NOT DETECTED Final   Streptococcus species NOT DETECTED NOT DETECTED Final   Streptococcus agalactiae NOT DETECTED NOT DETECTED Final   Streptococcus pneumoniae NOT DETECTED NOT DETECTED Final   Streptococcus pyogenes NOT DETECTED NOT DETECTED Final   A.calcoaceticus-baumannii NOT DETECTED NOT DETECTED Final   Bacteroides fragilis NOT DETECTED NOT DETECTED Final   Enterobacterales NOT DETECTED NOT DETECTED Final   Enterobacter cloacae complex NOT DETECTED NOT DETECTED Final   Escherichia coli NOT DETECTED NOT DETECTED Final   Klebsiella aerogenes NOT DETECTED NOT DETECTED Final   Klebsiella oxytoca NOT DETECTED NOT DETECTED Final   Klebsiella pneumoniae NOT DETECTED NOT DETECTED Final   Proteus species NOT DETECTED NOT DETECTED Final   Salmonella species NOT DETECTED NOT DETECTED Final   Serratia marcescens NOT DETECTED NOT DETECTED Final   Haemophilus influenzae NOT DETECTED NOT DETECTED Final   Neisseria meningitidis NOT DETECTED NOT DETECTED Final   Pseudomonas aeruginosa NOT DETECTED NOT DETECTED  Final   Stenotrophomonas maltophilia NOT DETECTED NOT DETECTED Final   Candida albicans NOT DETECTED NOT DETECTED Final   Candida auris NOT DETECTED NOT DETECTED Final   Candida glabrata NOT DETECTED NOT DETECTED Final   Candida krusei NOT DETECTED NOT DETECTED Final   Candida parapsilosis NOT DETECTED NOT DETECTED Final   Candida tropicalis NOT DETECTED NOT DETECTED Final   Cryptococcus neoformans/gattii NOT DETECTED NOT DETECTED Final   Methicillin resistance mecA/C DETECTED (A) NOT DETECTED Final    Comment: CRITICAL RESULT CALLED TO, READ BACK BY AND VERIFIED WITH: M LILLISTON,[email or web address]  10/30/23 MK Performed at [facility], [address] [place], [place] [address]   Blood Culture (routine x 2)     Status: None (Preliminary result)   Collection Time: 10/28/23  1:00 PM   Specimen: BLOOD  Result Value Ref Range Status   Specimen Description   Final    BLOOD SITE NOT SPECIFIED Performed at [facility], [address] [place], [place] [address]    Special Requests   Final    BOTTLES DRAWN AEROBIC AND ANAEROBIC Blood Culture adequate volume Performed at [facility], [address] [place], [place] [address]    Culture   Final    NO GROWTH 2 DAYS Performed at [facility], [address], [place], [place] [address]    Report Status PENDING  Incomplete  Urine Culture     Status: Abnormal   Collection Time: 10/28/23  2:39 PM   Specimen: Urine, Random  Result Value Ref Range Status   Specimen Description   Final    URINE, RANDOM Performed at [facility], [address], [place], [place] [address]    Special Requests   Final    NONE Reflexed from [id number] [id number] Performed at [facility], [address] [place], [place] [address]    Culture (A)  Final    <10,000 COLONIES/mL INSIGNIFICANT GROWTH Performed at [facility], [address], [place], [place] [address]    Report Status  10/29/2023 FINAL  Final  Urine Culture (for pregnant, neutropenic or urologic patients or patients with an indwelling  urinary catheter)     Status: None   Collection Time: 10/29/23 12:03 PM   Specimen: Urine, Clean Catch  Result Value Ref Range Status   Specimen Description   Final    URINE, CLEAN CATCH Performed at [facility], [address], [place], [place] [address]    Special Requests   Final    NONE Performed at [facility], [address], [place], [place] [address]    Culture   Final    NO GROWTH Performed at [facility], [address] [place], [place] [address]    Report Status 10/30/2023 FINAL  Final         Radiology Studies: DG CHEST PORT 1 VIEW  Result Date: 10/30/2023 CLINICAL DATA:  Cough EXAM: PORTABLE CHEST 1 VIEW COMPARISON:  10/28/2023 FINDINGS: Cardiomegaly status post median sternotomy and CABG. Mild diffuse interstitial opacity. Focal airspace opacity. No acute osseous findings. IMPRESSION: Cardiomegaly with mild diffuse interstitial opacity, likely edema. No focal airspace opacity. Electronically Signed   By: Jearld Lesch M.D.   On: 10/30/2023 17:45        Scheduled Meds:  acetaminophen  1,000 mg Oral TID   cholecalciferol  5,000 Units Oral Daily   DULoxetine  20 mg Oral BID   furosemide  20 mg Intravenous Once   heparin injection (subcutaneous)  5,000 Units Subcutaneous Q8H   insulin aspart  0-5 Units Subcutaneous QHS   insulin aspart  0-9 Units Subcutaneous TID WC   midodrine  5 mg Oral TID WC   multivitamin with minerals  1 tablet Oral QHS   polyvinyl alcohol  1 drop Left Eye BID   Ensure Max Protein  11 oz Oral Daily   sodium chloride flush  10 mL Intravenous Q12H   tamsulosin  0.4 mg Oral BID   Continuous Infusions:     LOS: 0 days    Time spent: 40 minutes    Ramiro Harvest, MD [facility]   To contact the attending provider between 7A-7P or the covering provider  during after hours 7P-7A, please log into the web site [email or web address] and access using universal [place] password for that web site. If you do not have the password, please call the hospital operator.  10/30/2023, 6:24 PM

## 2023-10-31 DIAGNOSIS — N179 Acute kidney failure, unspecified: Secondary | ICD-10-CM | POA: Diagnosis not present

## 2023-10-31 DIAGNOSIS — I959 Hypotension, unspecified: Secondary | ICD-10-CM | POA: Diagnosis not present

## 2023-10-31 DIAGNOSIS — G9341 Metabolic encephalopathy: Secondary | ICD-10-CM | POA: Diagnosis not present

## 2023-10-31 DIAGNOSIS — R55 Syncope and collapse: Secondary | ICD-10-CM | POA: Diagnosis not present

## 2023-10-31 LAB — CBC WITH DIFFERENTIAL/PLATELET
Abs Immature Granulocytes: 0.03 10*3/uL (ref 0.00–0.07)
Basophils Absolute: 0.1 10*3/uL (ref 0.0–0.1)
Basophils Relative: 1 %
Eosinophils Absolute: 0.5 10*3/uL (ref 0.0–0.5)
Eosinophils Relative: 5 %
HCT: 26 % — ABNORMAL LOW (ref 39.0–52.0)
Hemoglobin: 7.9 g/dL — ABNORMAL LOW (ref 13.0–17.0)
Immature Granulocytes: 0 %
Lymphocytes Relative: 18 %
Lymphs Abs: 2 10*3/uL (ref 0.7–4.0)
MCH: 29.8 pg (ref 26.0–34.0)
MCHC: 30.4 g/dL (ref 30.0–36.0)
MCV: 98.1 fL (ref 80.0–100.0)
Monocytes Absolute: 1 10*3/uL (ref 0.1–1.0)
Monocytes Relative: 9 %
Neutro Abs: 7.3 10*3/uL (ref 1.7–7.7)
Neutrophils Relative %: 67 %
Platelets: 314 10*3/uL (ref 150–400)
RBC: 2.65 MIL/uL — ABNORMAL LOW (ref 4.22–5.81)
RDW: 14.6 % (ref 11.5–15.5)
WBC: 10.9 10*3/uL — ABNORMAL HIGH (ref 4.0–10.5)
nRBC: 0 % (ref 0.0–0.2)

## 2023-10-31 LAB — GLUCOSE, CAPILLARY
Glucose-Capillary: 126 mg/dL — ABNORMAL HIGH (ref 70–99)
Glucose-Capillary: 103 mg/dL — ABNORMAL HIGH (ref 70–99)
Glucose-Capillary: 110 mg/dL — ABNORMAL HIGH (ref 70–99)
Glucose-Capillary: 152 mg/dL — ABNORMAL HIGH (ref 70–99)

## 2023-10-31 LAB — BASIC METABOLIC PANEL
Anion gap: 8 (ref 5–15)
BUN: 15 mg/dL (ref 8–23)
CO2: 24 mmol/L (ref 22–32)
Calcium: 10.2 mg/dL (ref 8.9–10.3)
Chloride: 103 mmol/L (ref 98–111)
Creatinine, Ser: 1.08 mg/dL (ref 0.61–1.24)
GFR, Estimated: >60 mL/min (ref >60)
Glucose, Bld: 122 mg/dL — ABNORMAL HIGH (ref 70–99)
Potassium: 3.7 mmol/L (ref 3.5–5.1)
Sodium: 135 mmol/L (ref 135–145)

## 2023-10-31 LAB — PREPARE RBC (CROSSMATCH)

## 2023-10-31 LAB — HEMOGLOBIN AND HEMATOCRIT, BLOOD
HCT: 30.4 % — ABNORMAL LOW (ref 39.0–52.0)
Hemoglobin: 9.5 g/dL — ABNORMAL LOW (ref 13.0–17.0)

## 2023-10-31 MED ORDER — ACETAMINOPHEN 325 MG PO TABS: 650.0000 mg | ORAL_TABLET | Freq: Once | ORAL | Status: DC

## 2023-10-31 MED ORDER — ORAL CARE MOUTH RINSE
15.0000 mL | OROMUCOSAL | Status: DC
  Administered 2023-10-31 – 2023-11-05 (×17): 15 mL via OROMUCOSAL

## 2023-10-31 MED ORDER — ORAL CARE MOUTH RINSE: 15.0000 mL | OROMUCOSAL | Status: DC | PRN

## 2023-10-31 MED ORDER — SODIUM CHLORIDE 0.9% IV SOLUTION: Freq: Once | INTRAVENOUS | Status: AC

## 2023-10-31 MED ORDER — FUROSEMIDE 10 MG/ML IJ SOLN
20.0000 mg | Freq: Once | INTRAMUSCULAR | Status: AC
  Administered 2023-10-31: 20 mg via INTRAVENOUS
  Filled 2023-10-31: qty 2

## 2023-10-31 NOTE — Plan of Care (Signed)

## 2023-10-31 NOTE — Plan of Care (Signed)
  Problem: Education: Goal: Ability to describe self-care measures that may prevent or decrease complications (Diabetes Survival Skills Education) will improve Outcome: Not Progressing Goal: Individualized Educational Video(s) Outcome: Not Progressing   Problem: Coping: Goal: Ability to adjust to condition or change in health will improve Outcome: Not Progressing   Problem: Fluid Volume: Goal: Ability to maintain a balanced intake and output will improve Outcome: Not Progressing   Problem: Health Behavior/Discharge Planning: Goal: Ability to identify and utilize available resources and services will improve Outcome: Not Progressing Goal: Ability to manage health-related needs will improve Outcome: Not Progressing   Problem: Metabolic: Goal: Ability to maintain appropriate glucose levels will improve Outcome: Not Progressing   Problem: Skin Integrity: Goal: Risk for impaired skin integrity will decrease Outcome: Not Progressing   Problem: Tissue Perfusion: Goal: Adequacy of tissue perfusion will improve Outcome: Not Progressing

## 2023-10-31 NOTE — Consult Note (Signed)
Urology Consult  Referring physician: Dr. Janee Morn Reason for referral: bladder mass, gross hematuria  Chief Complaint: gross hematuria  History of Present Illness: Mr [name] is a 89yo with a history of non-muscle invasive bladder cancer, CHF, CAD, DMII who was admitted for confusion, generalized weakness and concern for UTI. Yesterday he developed gross painless hematuria with clots. 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 The patient denies any urinary urgency, frequency or dysuria. CT obtain yesterday showed right bladder wall thickening and a small amount of clot int he bladder. He underwent bladder tumor resection in 04/2023 followed by 6 treatments of BCG spread over a 10 week period. Since his spine surgery has been hospitalized multiple times and has not been able to followup at [facility] for his surveillance cystoscopy. Currently the patient is mildly confused and drowsy. His urine has cleared this afternoon and is currently light brown,.   Past Medical History:  Diagnosis Date   Anemia due to chronic kidney disease    Benign localized prostatic hyperplasia with lower urinary tract symptoms (LUTS)    Bladder cancer (HCC) 04/2023   dr dahlstedt   Carpal tunnel syndrome on both sides    Chronic constipation    Chronic gout without tophus    followed by pcp    (05-07-2023  per pt last flare-up 6-8 months ago, usually affects bilateral great toes)   Chronic low back pain    per pt s/p RFA's   Chronic narcotic use    Chronic systolic CHF (congestive heart failure) (HCC)    followed by cardiology;  preserved ef   CKD (chronic kidney disease), stage III (HCC) 2017   previously seen by nephrologsit--- dr Hyman Hopes   Coronary artery disease 1993   cardiologist--- dr h. Katrinka Blazing;   1993-- s/p cath w/ PTCA to occluded RCA;  a. 2007: s/p CABG x4 (LIMA--> LAD, rSVG--> RI, rSVG-seq -->dRCA and PLA))  c. 2017: LHC with 2/3 patent bypass grafts with occuluded SVG--> RI   DDD (degenerative disc disease), lumbosacral     GERD (gastroesophageal reflux disease)    History of adenomatous polyp of colon    History of cardiomyopathy 09/2016   in setting severe aortic stenosis and new acute on chronic systolic CHF, ef 16-10%   History of DVT of lower extremity 2008   superficial thrombophlebitis-- R Lower leg, right upper leg   History of polymyalgia rheumatica 2011   per pt hx was treated and resolved after being dx approx 2011   History of prostate cancer 2009   urologist--- dr Retta Diones;   dx 2009,  Gleason 4+3;   02-08-2009  s/p radioactive prostate seed implants by dr Vonita Moss  (05-07-2023  PSA undetectable)   History of renal cell cancer 2014   primary urologist-- dr Frederich Cha;   incidental finding on imaging for back , left renal mass;   08-31-2013  s/p partial left nephrecotmy   History of septic shock 2016   post op  lumbar back surgery, positive blood culture  (discitis/ ostomyelitis)   Hyperlipidemia    Hypertension    Peripheral neuropathy    hands and feet   Pulmonary nodule, right    solitary ,  last Chest CT in epic 12-08-2018 stable   S/P aortic valve replacement with prosthetic valve 07/02/2017   @[facility]  by Dr Laneta Simmers w/ [employer] prosthesis  for severe aortic valve stenosis  (nonrheumatic)   Secondary hyperparathyroidism of renal origin (HCC)    Type 2 diabetes mellitus (HCC)    followed by  pcp;   (05-07-2023  per pt only check blood sugar occasionlly)   Wears partial dentures    upper   Past Surgical History:  Procedure Laterality Date   APPENDECTOMY     child   CARDIAC CATHETERIZATION N/A 11/03/2016   Procedure: Right/Left Heart Cath and Coronary/Graft Angiography;  Surgeon: Lyn Records, MD;  Location: [facility] INVASIVE CV LAB;  Service: Cardiovascular;  Laterality: N/A;   CARDIAC CATHETERIZATION  03/30/2006   @[facility]  by dr h. Katrinka Blazing;   severe coronary artery disease   CATARACT EXTRACTION W/ INTRAOCULAR LENS IMPLANT Bilateral    2015/ 2019   CORONARY ANGIOPLASTY  1993   @[facility]  by dr h. Katrinka Blazing;    PTCA to total occluded RCA   CORONARY ARTERY BYPASS GRAFT  03/31/2006   @[facility]  by dr gerhardt;   x4--  LIMA--LAD/  rSVG--RI/   rSVG seq -- dRCA and PLB of RCA   INSERTION PROSTATE RADIATION SEED  02/08/2009   @[facility]  by dr Vonita Moss;   radioactive prostate seed implants for prostate cancer   LEFT HEART CATH AND CORS/GRAFTS ANGIOGRAPHY N/A 09/01/2023   Procedure: LEFT HEART CATH AND CORS/GRAFTS ANGIOGRAPHY;  Surgeon: Corky Crafts, MD;  Location: [facility] INVASIVE CV LAB;  Service: Cardiovascular;  Laterality: N/A;   LUMBAR SPINE SURGERY  10/02/2015   dr Dutch Quint;   L3--L5   ROBOTIC ASSITED PARTIAL NEPHRECTOMY Left 08/31/2013   Procedure: ROBOTIC ASSITED PARTIAL NEPHRECTOMY;  Surgeon: Crecencio Mc, MD;  Location: [facility] ORS;  Service: Urology;  Laterality: Left;   TEE WITHOUT CARDIOVERSION N/A 06/22/2017   Procedure: TRANSESOPHAGEAL ECHOCARDIOGRAM (TEE);  Surgeon: Tonny Bollman, MD;  Location: [facility] OR;  Service: Open Heart Surgery;  Laterality: N/A;   TRANSCATHETER AORTIC VALVE REPLACEMENT, TRANSFEMORAL N/A 06/22/2017   Procedure: TRANSCATHETER AORTIC VALVE REPLACEMENT, TRANSFEMORAL;  Surgeon: Tonny Bollman, MD;  Location: [facility] OR;  Service: Open Heart Surgery;  Laterality: N/A;   VEIN LIGATION AND STRIPPING Right 1972   right lower leg    Medications: I have reviewed the patient's current medications. Allergies:  Allergies  Allergen Reactions   Fish Allergy Swelling    Dark fish- salmon, tuna    Lipitor [Atorvastatin] Other (See Comments)    Muscle pain   Penicillins Swelling    SWELLING OF THE FEET  Has patient had a PCN reaction causing immediate rash, facial/tongue/throat swelling, SOB or lightheadedness with hypotension: no Has patient had a PCN reaction causing severe rash involving mucus membranes or skin necrosis: no Has patient had a PCN reaction that required hospitalization no Has patient had a PCN reaction occurring within the last 10 years: no If all of the above answers are "NO",  then may proceed with Cephalosporin use.   Simvastatin Other (See Comments)    MUSCLE PAIN   Adacel [Tetanus-Diphth-Acell Pertussis]     UNSPECIFIED REACTION    Lasix [Furosemide] Swelling    SWELLING REACTION UNSPECIFIED    Septra [Sulfamethoxazole-Trimethoprim]     UNSPECIFIED REACTION    Flexeril [Cyclobenzaprine] Other (See Comments)    Causes confusion   Pantoprazole Diarrhea    Family History  Problem Relation Age of Onset   Heart Problems Mother    Heart Problems Father    Neuropathy Neg Hx    Social History:  reports that he quit smoking about 42 years ago. His smoking use included cigarettes. He started smoking about 72 years ago. He has a 90 pack-year smoking history. He has never used smokeless tobacco. He reports that he does not drink alcohol  and does not use drugs.  Review of Systems  Genitourinary:  Positive for hematuria.  Psychiatric/Behavioral:  Positive for confusion.   All other systems reviewed and are negative.   Physical Exam:  Vital signs in last 24 hours: Temp:  [97.6 F (36.4 C)-98.9 F (37.2 C)] 98.9 F (37.2 C) (11/10 1315) Pulse Rate:  [92-115] 100 (11/10 1315) Resp:  [16-20] 19 (11/10 1300) BP: (79-121)/(44-73) 104/63 (11/10 1315) SpO2:  [94 %-100 %] 95 % (11/10 1315) Physical Exam Vitals reviewed.  Constitutional:      Appearance: Normal appearance.  HENT:     Head: Normocephalic and atraumatic.     Nose: Nose normal.     Mouth/Throat:     Mouth: Mucous membranes are dry.  Cardiovascular:     Rate and Rhythm: Normal rate and regular rhythm.  Pulmonary:     Effort: Pulmonary effort is normal. No respiratory distress.  Abdominal:     General: Abdomen is flat. There is no distension.  Genitourinary:    Penis: Normal.      Testes: Normal.  Musculoskeletal:        General: No swelling.     Cervical back: Normal range of motion. No rigidity.  Skin:    General: Skin is warm and dry.  Neurological:     General: No focal deficit  present.     Laboratory Data:  Results for orders placed or performed during the hospital encounter of 10/28/23 (from the past 72 hour(s))  Glucose, capillary     Status: Abnormal   Collection Time: 10/28/23  9:43 PM  Result Value Ref Range   Glucose-Capillary 140 (H) 70 - 99 mg/dL    Comment: Glucose reference range applies only to samples taken after fasting for at least 8 hours.   Comment 1 Notify RN   Hemoglobin A1c     Status: Abnormal   Collection Time: 10/28/23  9:53 PM  Result Value Ref Range   Hgb A1c MFr Bld 6.4 (H) 4.8 - 5.6 %    Comment: (NOTE) Pre diabetes:          5.7%-6.4%  Diabetes:              >6.4%  Glycemic control for   <7.0% adults with diabetes    Mean Plasma Glucose 136.98 mg/dL    Comment: Performed at [facility], [address], [place], [place] [address]  Ammonia     Status: None   Collection Time: 10/28/23  9:53 PM  Result Value Ref Range   Ammonia 10 9 - 35 umol/L    Comment: Performed at [facility], [address] [place], [place] [address]  CBC     Status: Abnormal   Collection Time: 10/29/23  6:04 AM  Result Value Ref Range   WBC 9.1 4.0 - 10.5 K/uL   RBC 2.69 (L) 4.22 - 5.81 MIL/uL   Hemoglobin 8.1 (L) 13.0 - 17.0 g/dL   HCT 64.4 (L) 03.4 - 74.2 %   MCV 98.9 80.0 - 100.0 fL   MCH 30.1 26.0 - 34.0 pg   MCHC 30.5 30.0 - 36.0 g/dL   RDW 59.5 63.8 - 75.6 %   Platelets 320 150 - 400 K/uL   nRBC 0.0 0.0 - 0.2 %    Comment: Performed at [facility], [address] [place], [place] [address]  Basic metabolic panel     Status: Abnormal   Collection Time: 10/29/23  6:04 AM  Result Value Ref Range   Sodium 138 135 - 145 mmol/L   Potassium 3.6 3.5 - 5.1 mmol/L   Chloride 104 98 - 111 mmol/L   CO2 25 22 - 32 mmol/L   Glucose, Bld 125 (H) 70 - 99 mg/dL    Comment: Glucose reference range applies only to samples taken after fasting for at least 8 hours.   BUN 28 (H) 8 - 23 mg/dL   Creatinine,  Ser 0.86 (H) 0.61 - 1.24 mg/dL   Calcium 57.8 (H) 8.9 - 10.3 mg/dL   GFR, Estimated 49 (L) >60 mL/min    Comment: (NOTE) Calculated using the CKD-EPI Creatinine Equation (2021)    Anion gap 9 5 - 15    Comment: Performed at [facility], [address] [place], [place] [address]  Magnesium     Status: None   Collection Time: 10/29/23  6:04 AM  Result Value Ref Range   Magnesium 2.0 1.7 - 2.4 mg/dL    Comment: Performed at [facility], [address], [place], [place] [address]  Brain natriuretic peptide     Status: Abnormal   Collection Time: 10/29/23  6:04 AM  Result Value Ref Range   B Natriuretic Peptide 236.0 (H) 0.0 - 100.0 pg/mL    Comment: Performed at [facility], [address] [place], [place] [address]  Glucose, capillary     Status: Abnormal   Collection Time: 10/29/23  8:03 AM  Result Value Ref Range   Glucose-Capillary 117 (H) 70 - 99 mg/dL    Comment: Glucose reference range applies only to samples taken after fasting for at least 8 hours.  Urinalysis, Complete w Microscopic -Urine, Clean Catch     Status: Abnormal   Collection Time: 10/29/23 12:03 PM  Result Value Ref Range   Color, Urine RED (A) YELLOW    Comment: BIOCHEMICALS MAY BE AFFECTED BY COLOR   APPearance TURBID (A) CLEAR   Specific Gravity, Urine  1.005 - 1.030    TEST NOT REPORTED DUE TO COLOR INTERFERENCE OF URINE PIGMENT   pH  5.0 - 8.0    TEST NOT REPORTED DUE TO COLOR INTERFERENCE OF URINE PIGMENT   Glucose, UA (A) NEGATIVE mg/dL    TEST NOT REPORTED DUE TO COLOR INTERFERENCE OF URINE PIGMENT   Hgb urine dipstick (A) NEGATIVE    TEST NOT REPORTED DUE TO COLOR INTERFERENCE OF URINE PIGMENT   Bilirubin Urine (A) NEGATIVE    TEST NOT REPORTED DUE TO COLOR INTERFERENCE OF URINE PIGMENT   Ketones, ur (A) NEGATIVE mg/dL    TEST NOT REPORTED DUE TO COLOR INTERFERENCE OF URINE PIGMENT   Protein, ur (A) NEGATIVE mg/dL    TEST NOT REPORTED DUE  TO COLOR INTERFERENCE OF URINE PIGMENT   Nitrite (A) NEGATIVE    TEST NOT REPORTED DUE TO COLOR INTERFERENCE OF URINE PIGMENT   Leukocytes,Ua (A) NEGATIVE    TEST NOT REPORTED DUE TO COLOR INTERFERENCE OF URINE PIGMENT   RBC / HPF >50 0 - 5 RBC/hpf   WBC, UA 0-5 0 - 5 WBC/hpf   Bacteria, UA MANY (A) NONE SEEN   Squamous Epithelial / HPF 0-5 0 - 5 /HPF    Comment: Performed at [facility], [address], [place], [place] [address]  Urine Culture (for pregnant, neutropenic or urologic patients or patients with an indwelling urinary catheter)     Status: None   Collection Time: 10/29/23 12:03 PM  Specimen: Urine, Clean Catch  Result Value Ref Range   Specimen Description      URINE, CLEAN CATCH Performed at [facility], [address] [place], [place] [address]    Special Requests      NONE Performed at [facility], [address], [place], [place] [address]    Culture      NO GROWTH Performed at [facility], [address], [place], [place] [address]    Report Status 10/30/2023 FINAL   Glucose, capillary     Status: Abnormal   Collection Time: 10/29/23 12:58 PM  Result Value Ref Range   Glucose-Capillary 130 (H) 70 - 99 mg/dL    Comment: Glucose reference range applies only to samples taken after fasting for at least 8 hours.  Glucose, capillary     Status: Abnormal   Collection Time: 10/29/23  4:43 PM  Result Value Ref Range   Glucose-Capillary 123 (H) 70 - 99 mg/dL    Comment: Glucose reference range applies only to samples taken after fasting for at least 8 hours.  Glucose, capillary     Status: Abnormal   Collection Time: 10/29/23  9:50 PM  Result Value Ref Range   Glucose-Capillary 138 (H) 70 - 99 mg/dL    Comment: Glucose reference range applies only to samples taken after fasting for at least 8 hours.   Comment 1 Notify RN   CBC with Differential/Platelet     Status: Abnormal   Collection Time:  10/30/23  7:08 AM  Result Value Ref Range   WBC 9.7 4.0 - 10.5 K/uL   RBC 2.56 (L) 4.22 - 5.81 MIL/uL   Hemoglobin 7.8 (L) 13.0 - 17.0 g/dL   HCT 21.3 (L) 08.6 - 57.8 %   MCV 97.7 80.0 - 100.0 fL   MCH 30.5 26.0 - 34.0 pg   MCHC 31.2 30.0 - 36.0 g/dL   RDW 46.9 62.9 - 52.8 %   Platelets 309 150 - 400 K/uL   nRBC 0.0 0.0 - 0.2 %   Neutrophils Relative % 67 %   Neutro Abs 6.4 1.7 - 7.7 K/uL   Lymphocytes Relative 24 %   Lymphs Abs 2.4 0.7 - 4.0 K/uL   Monocytes Relative 8 %   Monocytes Absolute 0.8 0.1 - 1.0 K/uL   Eosinophils Relative 1 %   Eosinophils Absolute 0.1 0.0 - 0.5 K/uL   Basophils Relative 0 %   Basophils Absolute 0.0 0.0 - 0.1 K/uL   Immature Granulocytes 0 %   Abs Immature Granulocytes 0.04 0.00 - 0.07 K/uL    Comment: Performed at [facility], [address] [place], [place] [address]  Vitamin B12     Status: None   Collection Time: 10/30/23  7:08 AM  Result Value Ref Range   Vitamin B-12 385 180 - 914 pg/mL    Comment: (NOTE) This assay is not validated for testing neonatal or myeloproliferative syndrome specimens for Vitamin B12 levels. Performed at [facility], [address], [place], [place] [address]   Folate     Status: None   Collection Time: 10/30/23  7:08 AM  Result Value Ref Range   Folate 20.4 >5.9 ng/mL    Comment: Performed at [facility], [address], [place], [place] [address]  Iron and TIBC     Status: Abnormal   Collection Time: 10/30/23  7:08 AM  Result Value Ref Range   Iron 32 (L)  45 - 182 ug/dL   TIBC 010 (L) 272 - 536 ug/dL   Saturation Ratios 14 (L) 17.9 - 39.5 %   UIBC 205 ug/dL    Comment: Performed at [facility], [address] [place], [place] [address]  Ferritin     Status: Abnormal   Collection Time: 10/30/23  7:08 AM  Result Value Ref Range   Ferritin 429 (H) 24 - 336 ng/mL    Comment: Performed at [facility], [address] [place], [place] [address]  Basic metabolic panel     Status: Abnormal   Collection Time: 10/30/23  7:08 AM  Result Value Ref Range   Sodium 141 135 - 145 mmol/L   Potassium 4.0 3.5 - 5.1 mmol/L   Chloride 109 98 - 111 mmol/L   CO2 23 22 - 32 mmol/L   Glucose, Bld 138 (H) 70 - 99 mg/dL    Comment: Glucose reference range applies only to samples taken after fasting for at least 8 hours.   BUN 18 8 - 23 mg/dL   Creatinine, Ser 9.56 0.61 - 1.24 mg/dL   Calcium 38.7 (H) 8.9 - 10.3 mg/dL   GFR, Estimated >56 >43 mL/min    Comment: (NOTE) Calculated using the CKD-EPI Creatinine Equation (2021)    Anion gap 9 5 - 15    Comment: Performed at [facility], [address] [place], [place] [address]  Glucose, capillary     Status: Abnormal   Collection Time: 10/30/23  7:53 AM  Result Value Ref Range   Glucose-Capillary 133 (H) 70 - 99 mg/dL    Comment: Glucose reference range applies only to samples taken after fasting for at least 8 hours.   Comment 1 Notify RN   Glucose, capillary     Status: Abnormal   Collection Time: 10/30/23 11:58 AM  Result Value Ref Range   Glucose-Capillary 138 (H) 70 - 99 mg/dL    Comment: Glucose reference range applies only to samples taken after fasting for at least 8 hours.   Comment 1 Notify RN   Culture, blood (Routine X 2) w Reflex to ID Panel     Status: None (Preliminary result)   Collection Time: 10/30/23 12:16 PM   Specimen: BLOOD  Result Value Ref Range   Specimen Description      BLOOD BLOOD RIGHT ARM Performed at [facility], [address], [place], [place] [address]    Special Requests      BOTTLES DRAWN AEROBIC AND ANAEROBIC Blood Culture adequate volume Performed at [facility], [address] [place], [place] [address]    Culture      NO GROWTH < 24 HOURS Performed at [facility], [address] [place] [address] [place], [place] [address]    Report Status PENDING   Culture,  blood (Routine X 2) w Reflex to ID Panel     Status: None (Preliminary result)   Collection Time: 10/30/23 12:19 PM   Specimen: BLOOD  Result Value Ref Range   Specimen Description      BLOOD BLOOD LEFT HAND Performed at [facility], [address], [place], [place] [address]    Special Requests      BOTTLES DRAWN AEROBIC AND ANAEROBIC Blood Culture adequate volume Performed at [facility], [address] [place], [place] [address]    Culture      NO GROWTH < 24 HOURS Performed at [facility], [address] [place], [place] [address]    Report Status PENDING   Glucose, capillary     Status: Abnormal   Collection Time: 10/30/23  4:50 PM  Result Value Ref Range   Glucose-Capillary 145 (H) 70 - 99 mg/dL    Comment: Glucose reference range applies only to samples taken after fasting for at least 8 hours.   Comment 1 Notify RN   Glucose, capillary     Status: Abnormal   Collection Time: 10/30/23  8:21 PM  Result Value Ref Range   Glucose-Capillary 144 (H) 70 - 99 mg/dL    Comment: Glucose reference range applies only to samples taken after fasting for at least 8 hours.  CBC with Differential/Platelet     Status: Abnormal   Collection Time: 10/31/23  6:36 AM  Result Value Ref Range   WBC 10.9 (H) 4.0 - 10.5 K/uL   RBC 2.65 (L) 4.22 - 5.81 MIL/uL   Hemoglobin 7.9 (L) 13.0 - 17.0 g/dL   HCT 64.4 (L) 03.4 - 74.2 %   MCV 98.1 80.0 - 100.0 fL   MCH 29.8 26.0 - 34.0 pg   MCHC 30.4 30.0 - 36.0 g/dL   RDW 59.5 63.8 - 75.6 %   Platelets 314 150 - 400 K/uL   nRBC 0.0 0.0 - 0.2 %   Neutrophils Relative % 67 %   Neutro Abs 7.3 1.7 - 7.7 K/uL   Lymphocytes Relative 18 %   Lymphs Abs 2.0 0.7 - 4.0 K/uL   Monocytes Relative 9 %   Monocytes Absolute 1.0 0.1 - 1.0 K/uL   Eosinophils Relative 5 %   Eosinophils Absolute 0.5 0.0 - 0.5 K/uL   Basophils Relative 1 %   Basophils Absolute 0.1 0.0 - 0.1 K/uL   Immature Granulocytes 0 %   Abs  Immature Granulocytes 0.03 0.00 - 0.07 K/uL    Comment: Performed at [facility], [address], [place], [place] [address]  Basic metabolic panel     Status: Abnormal   Collection Time: 10/31/23  6:36 AM  Result Value Ref Range   Sodium 135 135 - 145 mmol/L   Potassium 3.7 3.5 - 5.1 mmol/L   Chloride 103 98 - 111 mmol/L   CO2 24 22 - 32 mmol/L   Glucose, Bld 122 (H) 70 - 99 mg/dL    Comment: Glucose reference range applies only to samples taken after fasting for at least 8 hours.   BUN 15 8 - 23 mg/dL   Creatinine, Ser 5.18 0.61 - 1.24 mg/dL   Calcium 84.1 8.9 - 66.0 mg/dL   GFR, Estimated >63 >01 mL/min    Comment: (NOTE) Calculated using the CKD-EPI Creatinine Equation (2021)    Anion gap 8 5 - 15    Comment: Performed at [facility], [address], [place], [place] [address]  Glucose, capillary     Status: Abnormal   Collection Time: 10/31/23  7:27 AM  Result Value Ref Range   Glucose-Capillary 126 (H) 70 - 99 mg/dL    Comment: Glucose reference range applies only to samples taken after fasting for at least 8 hours.   Comment 1 Notify RN   Type and screen [facility]     Status: None (Preliminary result)   Collection Time: 10/31/23 10:39 AM  Result Value Ref Range   ABO/RH(D) O POS    Antibody Screen NEG    Sample Expiration 11/03/2023,2359    Unit  Number [id number]    Blood Component Type RED CELLS,LR    Unit division 00    Status of Unit ISSUED    Transfusion Status OK TO TRANSFUSE    Crossmatch Result      Compatible Performed at [facility], [address], [place], [place] [address]   Prepare RBC (crossmatch)     Status: None   Collection Time: 10/31/23 10:39 AM  Result Value Ref Range   Order Confirmation      ORDER PROCESSED BY BLOOD BANK Performed at [facility], [address] [place], [place] [address]   Glucose, capillary     Status: Abnormal    Collection Time: 10/31/23 11:57 AM  Result Value Ref Range   Glucose-Capillary 103 (H) 70 - 99 mg/dL    Comment: Glucose reference range applies only to samples taken after fasting for at least 8 hours.   Comment 1 Notify RN    Recent Results (from the past 240 hour(s))  Resp panel by RT-PCR (RSV, Flu A&B, Covid) Anterior Nasal Swab     Status: None   Collection Time: 10/28/23 12:45 PM   Specimen: Anterior Nasal Swab  Result Value Ref Range Status   SARS Coronavirus 2 by RT PCR NEGATIVE NEGATIVE Final    Comment: (NOTE) SARS-CoV-2 target nucleic acids are NOT DETECTED.  The SARS-CoV-2 RNA is generally detectable in upper respiratory specimens during the acute phase of infection. The lowest concentration of SARS-CoV-2 viral copies this assay can detect is 138 copies/mL. A negative result does not preclude SARS-Cov-2 infection and should not be used as the sole basis for treatment or other patient management decisions. A negative result may occur with  improper specimen collection/handling, submission of specimen other than nasopharyngeal swab, presence of viral mutation(s) within the areas targeted by this assay, and inadequate number of viral copies(<138 copies/mL). A negative result must be combined with clinical observations, patient history, and epidemiological information. The expected result is Negative.  Fact Sheet for Patients:  BloggerCourse.com  Fact Sheet for Healthcare Providers:  SeriousBroker.it  This test is no t yet approved or cleared by the [place] FDA and  has been authorized for detection and/or diagnosis of SARS-CoV-2 by FDA under an Emergency Use Authorization (EUA). This EUA will remain  in effect (meaning this test can be used) for the duration of the COVID-19 declaration under Section 564(b)(1) of the Act, 21 U.S.C.section 360bbb-3(b)(1), unless the authorization is terminated  or revoked sooner.        Influenza A by PCR NEGATIVE NEGATIVE Final   Influenza B by PCR NEGATIVE NEGATIVE Final    Comment: (NOTE) The Xpert Xpress SARS-CoV-2/FLU/RSV plus assay is intended as an aid in the diagnosis of influenza from Nasopharyngeal swab specimens and should not be used as a sole basis for treatment. Nasal washings and aspirates are unacceptable for Xpert Xpress SARS-CoV-2/FLU/RSV testing.  Fact Sheet for Patients: BloggerCourse.com  Fact Sheet for Healthcare Providers: SeriousBroker.it  This test is not yet approved or cleared by the [place] FDA and has been authorized for detection and/or diagnosis of SARS-CoV-2 by FDA under an Emergency Use Authorization (EUA). This EUA will remain in effect (meaning this test can be used) for the duration of the COVID-19 declaration under Section 564(b)(1) of the Act, 21 U.S.C. section 360bbb-3(b)(1), unless the authorization is terminated or revoked.     Resp Syncytial Virus by PCR NEGATIVE NEGATIVE Final    Comment: (NOTE) Fact Sheet for Patients:  BloggerCourse.com  Fact Sheet for Healthcare Providers: SeriousBroker.it  This test is not yet approved or cleared by the [place] FDA and has been authorized for detection and/or diagnosis of SARS-CoV-2 by FDA under an Emergency Use Authorization (EUA). This EUA will remain in effect (meaning this test can be used) for the duration of the COVID-19 declaration under Section 564(b)(1) of the Act, 21 U.S.C. section 360bbb-3(b)(1), unless the authorization is terminated or revoked.  Performed at [facility], [address] [place], [place] [address]   Blood Culture (routine x 2)     Status: Abnormal (Preliminary result)   Collection Time: 10/28/23 12:50 PM   Specimen: BLOOD  Result Value Ref Range Status   Specimen Description   Final    BLOOD SITE NOT  SPECIFIED Performed at [facility], [address], [place], [place] [address]    Special Requests   Final    BOTTLES DRAWN AEROBIC AND ANAEROBIC Blood Culture results may not be optimal due to an inadequate volume of blood received in culture bottles Performed at [facility], [address] [place], [place] [address]    Culture  Setup Time   Final    GRAM POSITIVE COCCI BOTTLES DRAWN AEROBIC ONLY Organism ID to follow CRITICAL RESULT CALLED TO, READ BACK BY AND VERIFIED WITH: M LILLISTON,[email or web address]  10/30/23 MK    Culture (A)  Final    STAPHYLOCOCCUS EPIDERMIDIS THE SIGNIFICANCE OF ISOLATING THIS ORGANISM FROM A SINGLE SET OF BLOOD CULTURES WHEN MULTIPLE SETS ARE DRAWN IS UNCERTAIN. PLEASE NOTIFY THE MICROBIOLOGY DEPARTMENT WITHIN ONE WEEK IF SPECIATION AND SENSITIVITIES ARE REQUIRED. Performed at [facility], [address] [place], [place] [address]    Report Status PENDING  Incomplete  Blood Culture ID Panel (Reflexed)     Status: Abnormal   Collection Time: 10/28/23 12:50 PM  Result Value Ref Range Status   Enterococcus faecalis NOT DETECTED NOT DETECTED Final   Enterococcus Faecium NOT DETECTED NOT DETECTED Final   Listeria monocytogenes NOT DETECTED NOT DETECTED Final   Staphylococcus species DETECTED (A) NOT DETECTED Final    Comment: CRITICAL RESULT CALLED TO, READ BACK BY AND VERIFIED WITH: M LILLISTON,[email or web address]  10/30/23 MK    Staphylococcus aureus (BCID) NOT DETECTED NOT DETECTED Final   Staphylococcus epidermidis DETECTED (A) NOT DETECTED Final    Comment: Methicillin (oxacillin) resistant coagulase negative staphylococcus. Possible blood culture contaminant (unless isolated from more than one blood culture draw or clinical case suggests pathogenicity). No antibiotic treatment is indicated for blood  culture contaminants. CRITICAL RESULT CALLED TO, READ BACK BY AND VERIFIED WITH: M LILLISTON,[email or web address]  10/30/23 MK     Staphylococcus lugdunensis NOT DETECTED NOT DETECTED Final   Streptococcus species NOT DETECTED NOT DETECTED Final   Streptococcus agalactiae NOT DETECTED NOT DETECTED Final   Streptococcus pneumoniae NOT DETECTED NOT DETECTED Final   Streptococcus pyogenes NOT DETECTED NOT DETECTED Final   A.calcoaceticus-baumannii NOT DETECTED NOT DETECTED Final   Bacteroides fragilis NOT DETECTED NOT DETECTED Final   Enterobacterales NOT DETECTED NOT DETECTED Final   Enterobacter cloacae complex NOT DETECTED NOT DETECTED Final   Escherichia coli NOT DETECTED NOT DETECTED Final   Klebsiella aerogenes NOT DETECTED NOT DETECTED Final   Klebsiella oxytoca NOT DETECTED NOT DETECTED Final   Klebsiella pneumoniae NOT DETECTED NOT DETECTED Final   Proteus species NOT DETECTED NOT DETECTED Final   Salmonella species NOT DETECTED NOT DETECTED Final   Serratia marcescens NOT DETECTED NOT DETECTED Final  Haemophilus influenzae NOT DETECTED NOT DETECTED Final   Neisseria meningitidis NOT DETECTED NOT DETECTED Final   Pseudomonas aeruginosa NOT DETECTED NOT DETECTED Final   Stenotrophomonas maltophilia NOT DETECTED NOT DETECTED Final   Candida albicans NOT DETECTED NOT DETECTED Final   Candida auris NOT DETECTED NOT DETECTED Final   Candida glabrata NOT DETECTED NOT DETECTED Final   Candida krusei NOT DETECTED NOT DETECTED Final   Candida parapsilosis NOT DETECTED NOT DETECTED Final   Candida tropicalis NOT DETECTED NOT DETECTED Final   Cryptococcus neoformans/gattii NOT DETECTED NOT DETECTED Final   Methicillin resistance mecA/C DETECTED (A) NOT DETECTED Final    Comment: CRITICAL RESULT CALLED TO, READ BACK BY AND VERIFIED WITH: M LILLISTON,[email or web address]  10/30/23 MK Performed at [facility], [address], [place], [place] [address]   Blood Culture (routine x 2)     Status: None (Preliminary result)   Collection Time: 10/28/23  1:00 PM   Specimen: BLOOD  Result Value Ref Range Status   Specimen  Description   Final    BLOOD SITE NOT SPECIFIED Performed at [facility], [address] [place], [place] [address]    Special Requests   Final    BOTTLES DRAWN AEROBIC AND ANAEROBIC Blood Culture adequate volume Performed at [facility], [address], [place], [place] [address]    Culture   Final    NO GROWTH 3 DAYS Performed at [facility], [address], [place], [place] [address]    Report Status PENDING  Incomplete  Urine Culture     Status: Abnormal   Collection Time: 10/28/23  2:39 PM   Specimen: Urine, Random  Result Value Ref Range Status   Specimen Description   Final    URINE, RANDOM Performed at [facility], [address], [place], [place] [address]    Special Requests   Final    NONE Reflexed from [id number] Performed at [facility], [address], [place], [place] [address]    Culture (A)  Final    <10,000 COLONIES/mL INSIGNIFICANT GROWTH Performed at [facility], [address] [place], [place] [address]    Report Status 10/29/2023 FINAL  Final  Urine Culture (for pregnant, neutropenic or urologic patients or patients with an indwelling urinary catheter)     Status: None   Collection Time: 10/29/23 12:03 PM   Specimen: Urine, Clean Catch  Result Value Ref Range Status   Specimen Description   Final    URINE, CLEAN CATCH Performed at [facility], [address] [place], [place] [address]    Special Requests   Final    NONE Performed at [facility], [address] [place], [place] [address]    Culture   Final    NO GROWTH Performed at [facility], [address] [place], [place] [address]    Report Status 10/30/2023 FINAL  Final  Culture, blood (Routine X 2) w Reflex to ID Panel     Status: None (Preliminary result)   Collection Time: 10/30/23 12:16 PM   Specimen: BLOOD  Result Value Ref Range Status    Specimen Description   Final    BLOOD BLOOD RIGHT ARM Performed at [facility], [address], [place], [place] [address]    Special Requests   Final    BOTTLES DRAWN AEROBIC AND ANAEROBIC Blood Culture adequate volume Performed at [facility],  [address] [place], [place] [address]    Culture   Final    NO GROWTH < 24 HOURS Performed at [facility], [address], [place], [place] [address]    Report Status PENDING  Incomplete  Culture, blood (Routine X 2) w Reflex to ID Panel     Status: None (Preliminary result)   Collection Time: 10/30/23 12:19 PM   Specimen: BLOOD  Result Value Ref Range Status   Specimen Description   Final    BLOOD BLOOD LEFT HAND Performed at [facility], [address] [place], [place] [address]    Special Requests   Final    BOTTLES DRAWN AEROBIC AND ANAEROBIC Blood Culture adequate volume Performed at [facility], [address], [place], [place] [address]    Culture   Final    NO GROWTH < 24 HOURS Performed at [facility], [address] [place], [place] [address]    Report Status PENDING  Incomplete   Creatinine: Recent Labs    10/28/23 1227 10/29/23 0604 10/30/23 0708 10/31/23 0636  CREATININE 1.56* 1.38* 1.16 1.08   Baseline Creatinine: 1  Impression/Assessment:  89yo with bladder cancer and gross hematuria  Plan:  Bladder cancer: I discussed the management of bladder cancer with the patient and family. The patient does not require Urologic intervention at this time and he will be scheduled for outpatient cystoscopy to evaluate the bladder mass on the CT 2. Gross hematuria: I discussed the etiologies of gross hematuria and the various management options. Currently the patient is emptying his bladder well and his urine is clearing. He does not require foley catheter place or irrigation at this time. Urology to continue to follow  Wilkie Aye 10/31/2023, 3:48 PM

## 2023-10-31 NOTE — Progress Notes (Signed)
PROGRESS NOTE    [name]  ZOX:[id number] DOB: 1934-04-24 DOA: 10/28/2023 PCP: [name], MD   Chief Complaint  Patient presents with   Hypotension    Brief Narrative:  Patient 89 year old gentleman history of prostate cancer status post radiation, CKD stage II-3A, renal cell carcinoma status post nephrectomy, bladder cancer, hypertension, hyperlipidemia, CAD status post CABG, type 2 diabetes, chronic HFrEF, history of TAVR, chronic back pain on chronic opiates, gout, GERD recently admitted 09/23/2023-09/30/2023 for surgical wound infection of the back with cellulitis and UTI.  Patient also noted to be hypotensive during that hospitalization and treated with midodrine which was discontinued prior to discharge.  Patient presented to the ED with transient hypotension which responded to IV fluids, dysuria, generalized weakness x 1 week.  Family also with concerns for confusion over the past few days and near syncopal episode.  Patient noted to have had decreased oral intake.  Patient seen in the ED, placed on IV fluids, COVID-19 PCR, influenza AMB PCR, RSV PCR negative.  Blood cultures obtained.  Urinalysis also obtained as well as urine cultures.  Patient also noted to be in acute kidney injury.  Head CT negative.  Patient given Percocet, vancomycin, cefepime, metronidazole in the ED and patient admitted for further evaluation and management.   Assessment & Plan:   Principal Problem:   Acute renal failure superimposed on stage 3a chronic kidney disease (HCC) Active Problems:   Essential hypertension   Diabetes mellitus (HCC)   Transient hypotension   Acute metabolic encephalopathy   Near syncope   Physical deconditioning   Hematuria   AMS (altered mental status)  #1 AKI on CKD stage II-IIIa -Blood secondary to a prerenal azotemia in the setting of poor oral intake, dehydration and diuretics. -Creatinine on admission noted at 1.5 with baseline 1.0-1.2. -Urinalysis done with trace  leukocytes, nitrite negative, 11-20 WBCs. -Urine cultures obtained. -Patient on IV fluids urine output of 1.5 L over the past 24 hours. -Renal function slowly trending down creatinine at 1.08. -Continue to hold torsemide. -Patient with cough on 10/30/2023 and due to concerns for volume overload patient given Lasix 20 mg IV x 1 with urine output of 1.5 L over the past 24 hours.   -IV fluids saline locked.   -Will give another dose of Lasix 20 mg IV x 1 posttransfusion of PRBCs.  2.  Hematuria/abnormal CT renal stone protocol -Per RN patient with hematuria during the hospitalization.  -Patient with complaints of back pain however also does have chronic back pain. -CT renal stone protocol with asymmetric wall thickening of the inferior right bladder worrisome for neoplasm.  Recommend cystoscopy.  Small amount of layering hyperdensity in the bladder worrisome for blood products.  Nonobstructing right renal calculi.  Left renal atrophy with adjacent presumed scarring.  Bilateral renal cysts.  Colonic diverticulosis.  Trace bilateral pleural effusions.   -Patient with prior history of bladder cancer, renal cell carcinoma status post nephrectomy, now with some hematuria and abnormal CT scan concerning for recurrent bladder cancer.   -Consult with urology for further evaluation and management.   -Supportive care.  3.  Acute metabolic encephalopathy -Per family patient noted to have some confusion. -Likely multifactorial secondary to dehydration in the setting of chronic opioid use for back pain. -Patient received IV antibiotics in the ED, no obvious infection noted at this time as patient with no fever, no leukocytosis, no meningeal signs. -Patient with no respiratory symptoms, chest x-ray negative for any acute infiltrates. -Urinalysis not strongly  suggestive of UTI with urine cultures pending. -Patient with no signs of infection of lower back surgical wound site at this time. -CT head negative for  any acute abnormalities. -Patient with recent labs a month ago with TSH, vitamin B12, B1 levels within normal limits. -Ammonia levels within normal limits. -Initial blood cultures drawn on admission with MRSE likely contaminant. -Repeat blood cultures x 2 pending with no growth to date.. -Patient improving clinically.. -Minimize narcotics. -Supportive care.  4.  Transient hypotension -Patient presentation noted to have a transient hypotension which responded to IV fluids. -Diuretics of torsemide on hold. -BP fluctuating noted to have been on midodrine before in the past. -Concern for volume overload and as such saline lock IV fluids. -BP borderline, patient given Lasix 20 mg IV x 1 on 10/30/2023 and as such patient started on midodrine 5 mg 3 times daily which we will continue.  5.  Near syncope -As noted per admitting physician (ED report family reported near syncopal episode the day prior to admission likely secondary to dehydration and hypotension. -CT head with no acute abnormalities. -Patient with no acute focal neurological deficits. -Patient not hypoxic with no shortness of breath and low probability for PE. -Clinical improvement with hydration.  6.  Generalized weakness/physical deconditioning -PT/OT.  7.  Hypertension -Continue to hold Toprol-XL, torsemide.   8.  Diabetes mellitus type 2 -Hemoglobin A1c 6.4 (10/28/2023) -CBG 126 this morning. -Continue to hold oral hypoglycemic agents. -SSI.  9.  Chronic back pain -Patient noted to be on chronic opioids for a while for his chronic back pain. -Due to concerns opioid may be contributing to patient's altered mental status on presentation and as such patient placed on oxycodone 5 mg every 4 hours as needed pain.   -Continue scheduled Tylenol 1000 mg 3 times daily.   -Follow  10.  Chronic HFrEF -2D echo from 08/05/2023 with a EF of 30%, grade 1 diastolic dysfunction, right ventricular systolic function moderately reduced,  mild MVR, trivial AVR.  Patient with no signs of significant volume overload on exam however patient with coughing on 11/8-11/9 and felt likely secondary to volume overload.. -Diuretics held on admission. -Status post Lasix 20 mg IV x 1 10/30/2023 with a urine output of 1.5 L over the past 24 hours. -Saline lock IV fluids. -Lasix 20 mg IV x 1 after transfusion of PRBC...  11.  Normocytic anemia -Patient noted with some hematuria per RN. -Hemoglobin currently at 7.9.  -Anemia panel with iron of 32, TIBC of 237, ferritin of 429, folate of 20.4, vitamin B12 of 385. -Transfuse 1 unit PRBCs. -Follow H&H.  12.  GERD -Pepcid.    13.  CAD status post CABG -Stable. -Continue to hold home regimen aspirin in light of concern for hematuria. -Due to transient hypotension on admission continue to hold Toprol-XL and Demadex.    DVT prophylaxis: Heparin>>>> SCDs Code Status: Full Family Communication: Updated patient, wife, niece at bedside. Disposition:   Status is: Observation The patient remains OBS appropriate and will d/c before 2 midnights.   Consultants:  Urology  Procedures:  CT head 10/28/2023 Chest x-ray 10/28/2023 CT renal stone protocol 10/30/2023 Transfuse 1 unit PRBCs 10/31/2023  Antimicrobials:  Anti-infectives (From admission, onward)    Start     Dose/Rate Route Frequency Ordered Stop   10/28/23 1300  vancomycin (VANCOREADY) IVPB 1500 mg/300 mL        1,500 mg 150 mL/hr over 120 Minutes Intravenous  Once 10/28/23 1240 10/28/23 1900   10/28/23  1245  ceFEPIme (MAXIPIME) 2 g in sodium chloride 0.9 % 100 mL IVPB        2 g 200 mL/hr over 30 Minutes Intravenous  Once 10/28/23 1236 10/28/23 1513   10/28/23 1245  metroNIDAZOLE (FLAGYL) IVPB 500 mg        500 mg 100 mL/hr over 60 Minutes Intravenous  Once 10/28/23 1236 10/28/23 1406   10/28/23 1245  vancomycin (VANCOCIN) IVPB 1000 mg/200 mL premix  Status:  Discontinued        1,000 mg 200 mL/hr over 60 Minutes Intravenous   Once 10/28/23 1236 10/28/23 1239         Subjective: Patient asleep.  Wife at bedside.   Objective: Vitals:   10/31/23 0553 10/31/23 1029 10/31/23 1259 10/31/23 1300  BP: (!) 102/57 121/73 (!) (P) 106/58 (!) 106/58  Pulse: (!) 103 (!) 102 (P) 96 92  Resp: 16   19  Temp: 98 F (36.7 C) 98 F (36.7 C) (P) 98.4 F (36.9 C) 98.4 F (36.9 C)  TempSrc:  Oral (P) Oral Oral  SpO2: 95% 98% (P) 94%   Weight:      Height:        Intake/Output Summary (Last 24 hours) at 10/31/2023 1319 Last data filed at 10/31/2023 0800 Gross per 24 hour  Intake 110 ml  Output 2000 ml  Net -1890 ml   Filed Weights   10/28/23 1247  Weight: 74 kg    Examination:  General exam: NAD. Respiratory system: CTAB anterior lung fields.  No wheezes, no crackles, no rhonchi.  Fair air movement.  Speaking in full sentences.  Cardiovascular system: RRR no murmurs rubs or gallops.  No JVD.  No lower extremity edema.  Gastrointestinal system: Abdomen is soft, nontender, nondistended, positive bowel sounds.  No rebound.  No guarding.  Central nervous system: Sleeping.  Moving extremities spontaneously. No focal neurological deficits. Extremities: Symmetric 5 x 5 power. Skin: No rashes, lesions or ulcers Psychiatry: Judgement and insight appear normal. Mood & affect appropriate.     Data Reviewed: I have personally reviewed following labs and imaging studies  CBC: Recent Labs  Lab 10/28/23 1227 10/29/23 0604 10/30/23 0708 10/31/23 0636  WBC 10.0 9.1 9.7 10.9*  NEUTROABS 6.6  --  6.4 7.3  HGB 8.4* 8.1* 7.8* 7.9*  HCT 27.3* 26.6* 25.0* 26.0*  MCV 98.2 98.9 97.7 98.1  PLT 328 320 309 314    Basic Metabolic Panel: Recent Labs  Lab 10/28/23 1227 10/29/23 0604 10/30/23 0708 10/31/23 0636  NA 136 138 141 135  K 3.6 3.6 4.0 3.7  CL 101 104 109 103  CO2 26 25 23 24   GLUCOSE 147* 125* 138* 122*  BUN 32* 28* 18 15  CREATININE 1.56* 1.38* 1.16 1.08  CALCIUM 10.3 10.6* 10.5* 10.2  MG  --  2.0   --   --     GFR: Estimated Creatinine Clearance: 43.5 mL/min (by C-G formula based on SCr of 1.08 mg/dL).  Liver Function Tests: Recent Labs  Lab 10/28/23 1227  AST 16  ALT 8  ALKPHOS 49  BILITOT 0.4  PROT 6.6  ALBUMIN 2.4*    CBG: Recent Labs  Lab 10/30/23 1158 10/30/23 1650 10/30/23 2021 10/31/23 0727 10/31/23 1157  GLUCAP 138* 145* 144* 126* 103*     Recent Results (from the past 240 hour(s))  Resp panel by RT-PCR (RSV, Flu A&B, Covid) Anterior Nasal Swab     Status: None   Collection Time: 10/28/23 12:45 PM  Specimen: Anterior Nasal Swab  Result Value Ref Range Status   SARS Coronavirus 2 by RT PCR NEGATIVE NEGATIVE Final    Comment: (NOTE) SARS-CoV-2 target nucleic acids are NOT DETECTED.  The SARS-CoV-2 RNA is generally detectable in upper respiratory specimens during the acute phase of infection. The lowest concentration of SARS-CoV-2 viral copies this assay can detect is 138 copies/mL. A negative result does not preclude SARS-Cov-2 infection and should not be used as the sole basis for treatment or other patient management decisions. A negative result may occur with  improper specimen collection/handling, submission of specimen other than nasopharyngeal swab, presence of viral mutation(s) within the areas targeted by this assay, and inadequate number of viral copies(<138 copies/mL). A negative result must be combined with clinical observations, patient history, and epidemiological information. The expected result is Negative.  Fact Sheet for Patients:  BloggerCourse.com  Fact Sheet for Healthcare Providers:  SeriousBroker.it  This test is no t yet approved or cleared by the [place] FDA and  has been authorized for detection and/or diagnosis of SARS-CoV-2 by FDA under an Emergency Use Authorization (EUA). This EUA will remain  in effect (meaning this test can be used) for the duration of  the COVID-19 declaration under Section 564(b)(1) of the Act, 21 U.S.C.section 360bbb-3(b)(1), unless the authorization is terminated  or revoked sooner.       Influenza A by PCR NEGATIVE NEGATIVE Final   Influenza B by PCR NEGATIVE NEGATIVE Final    Comment: (NOTE) The Xpert Xpress SARS-CoV-2/FLU/RSV plus assay is intended as an aid in the diagnosis of influenza from Nasopharyngeal swab specimens and should not be used as a sole basis for treatment. Nasal washings and aspirates are unacceptable for Xpert Xpress SARS-CoV-2/FLU/RSV testing.  Fact Sheet for Patients: BloggerCourse.com  Fact Sheet for Healthcare Providers: SeriousBroker.it  This test is not yet approved or cleared by the [place] FDA and has been authorized for detection and/or diagnosis of SARS-CoV-2 by FDA under an Emergency Use Authorization (EUA). This EUA will remain in effect (meaning this test can be used) for the duration of the COVID-19 declaration under Section 564(b)(1) of the Act, 21 U.S.C. section 360bbb-3(b)(1), unless the authorization is terminated or revoked.     Resp Syncytial Virus by PCR NEGATIVE NEGATIVE Final    Comment: (NOTE) Fact Sheet for Patients: BloggerCourse.com  Fact Sheet for Healthcare Providers: SeriousBroker.it  This test is not yet approved or cleared by the [place] FDA and has been authorized for detection and/or diagnosis of SARS-CoV-2 by FDA under an Emergency Use Authorization (EUA). This EUA will remain in effect (meaning this test can be used) for the duration of the COVID-19 declaration under Section 564(b)(1) of the Act, 21 U.S.C. section 360bbb-3(b)(1), unless the authorization is terminated or revoked.  Performed at [facility], [address] [place], [place] [address]   Blood Culture (routine x 2)     Status: Abnormal  (Preliminary result)   Collection Time: 10/28/23 12:50 PM   Specimen: BLOOD  Result Value Ref Range Status   Specimen Description   Final    BLOOD SITE NOT SPECIFIED Performed at [facility], [address] [place], [place] [address]    Special Requests   Final    BOTTLES DRAWN AEROBIC AND ANAEROBIC Blood Culture results may not be optimal due to an inadequate volume of blood received in culture bottles Performed at [facility], [address] [place], [place] [address]  Culture  Setup Time   Final    GRAM POSITIVE COCCI BOTTLES DRAWN AEROBIC ONLY Organism ID to follow CRITICAL RESULT CALLED TO, READ BACK BY AND VERIFIED WITH: M LILLISTON,[email or web address]  10/30/23 MK    Culture (A)  Final    STAPHYLOCOCCUS EPIDERMIDIS THE SIGNIFICANCE OF ISOLATING THIS ORGANISM FROM A SINGLE SET OF BLOOD CULTURES WHEN MULTIPLE SETS ARE DRAWN IS UNCERTAIN. PLEASE NOTIFY THE MICROBIOLOGY DEPARTMENT WITHIN ONE WEEK IF SPECIATION AND SENSITIVITIES ARE REQUIRED. Performed at [facility], [address], [place], [place] [address]    Report Status PENDING  Incomplete  Blood Culture ID Panel (Reflexed)     Status: Abnormal   Collection Time: 10/28/23 12:50 PM  Result Value Ref Range Status   Enterococcus faecalis NOT DETECTED NOT DETECTED Final   Enterococcus Faecium NOT DETECTED NOT DETECTED Final   Listeria monocytogenes NOT DETECTED NOT DETECTED Final   Staphylococcus species DETECTED (A) NOT DETECTED Final    Comment: CRITICAL RESULT CALLED TO, READ BACK BY AND VERIFIED WITH: M LILLISTON,[email or web address]  10/30/23 MK    Staphylococcus aureus (BCID) NOT DETECTED NOT DETECTED Final   Staphylococcus epidermidis DETECTED (A) NOT DETECTED Final    Comment: Methicillin (oxacillin) resistant coagulase negative staphylococcus. Possible blood culture contaminant (unless isolated from more than one blood culture draw or clinical case suggests pathogenicity). No  antibiotic treatment is indicated for blood  culture contaminants. CRITICAL RESULT CALLED TO, READ BACK BY AND VERIFIED WITH: M LILLISTON,[email or web address]  10/30/23 MK    Staphylococcus lugdunensis NOT DETECTED NOT DETECTED Final   Streptococcus species NOT DETECTED NOT DETECTED Final   Streptococcus agalactiae NOT DETECTED NOT DETECTED Final   Streptococcus pneumoniae NOT DETECTED NOT DETECTED Final   Streptococcus pyogenes NOT DETECTED NOT DETECTED Final   A.calcoaceticus-baumannii NOT DETECTED NOT DETECTED Final   Bacteroides fragilis NOT DETECTED NOT DETECTED Final   Enterobacterales NOT DETECTED NOT DETECTED Final   Enterobacter cloacae complex NOT DETECTED NOT DETECTED Final   Escherichia coli NOT DETECTED NOT DETECTED Final   Klebsiella aerogenes NOT DETECTED NOT DETECTED Final   Klebsiella oxytoca NOT DETECTED NOT DETECTED Final   Klebsiella pneumoniae NOT DETECTED NOT DETECTED Final   Proteus species NOT DETECTED NOT DETECTED Final   Salmonella species NOT DETECTED NOT DETECTED Final   Serratia marcescens NOT DETECTED NOT DETECTED Final   Haemophilus influenzae NOT DETECTED NOT DETECTED Final   Neisseria meningitidis NOT DETECTED NOT DETECTED Final   Pseudomonas aeruginosa NOT DETECTED NOT DETECTED Final   Stenotrophomonas maltophilia NOT DETECTED NOT DETECTED Final   Candida albicans NOT DETECTED NOT DETECTED Final   Candida auris NOT DETECTED NOT DETECTED Final   Candida glabrata NOT DETECTED NOT DETECTED Final   Candida krusei NOT DETECTED NOT DETECTED Final   Candida parapsilosis NOT DETECTED NOT DETECTED Final   Candida tropicalis NOT DETECTED NOT DETECTED Final   Cryptococcus neoformans/gattii NOT DETECTED NOT DETECTED Final   Methicillin resistance mecA/C DETECTED (A) NOT DETECTED Final    Comment: CRITICAL RESULT CALLED TO, READ BACK BY AND VERIFIED WITH: M LILLISTON,[email or web address]  10/30/23 MK Performed at [facility], [address] [place], [place] [address]    Blood Culture (routine x 2)     Status: None (Preliminary result)   Collection Time: 10/28/23  1:00 PM   Specimen: BLOOD  Result Value Ref Range Status   Specimen Description   Final    BLOOD SITE NOT SPECIFIED Performed at [facility], [address] [place], [place] [address]  Special Requests   Final    BOTTLES DRAWN AEROBIC AND ANAEROBIC Blood Culture adequate volume Performed at [facility], [address], [place], [place] [address]    Culture   Final    NO GROWTH 3 DAYS Performed at [facility], [address], [place], [place] [address]    Report Status PENDING  Incomplete  Urine Culture     Status: Abnormal   Collection Time: 10/28/23  2:39 PM   Specimen: Urine, Random  Result Value Ref Range Status   Specimen Description   Final    URINE, RANDOM Performed at [facility], [address], [place], [place] [address]    Special Requests   Final    NONE Reflexed from [id number] Performed at [facility], [address], [place], [place] [address]    Culture (A)  Final    <10,000 COLONIES/mL INSIGNIFICANT GROWTH Performed at [facility], [address], [place], [place] [address]    Report Status 10/29/2023 FINAL  Final  Urine Culture (for pregnant, neutropenic or urologic patients or patients with an indwelling urinary catheter)     Status: None   Collection Time: 10/29/23 12:03 PM   Specimen: Urine, Clean Catch  Result Value Ref Range Status   Specimen Description   Final    URINE, CLEAN CATCH Performed at [facility], [address] [place], [place] [address]    Special Requests   Final    NONE Performed at [facility], [address] [place], [place] [address]    Culture   Final    NO GROWTH Performed at [facility], [address], [place], [place] [address]    Report Status 10/30/2023 FINAL  Final  Culture, blood  (Routine X 2) w Reflex to ID Panel     Status: None (Preliminary result)   Collection Time: 10/30/23 12:16 PM   Specimen: BLOOD  Result Value Ref Range Status   Specimen Description   Final    BLOOD BLOOD RIGHT ARM Performed at [facility], [address] [place], [place] [address]    Special Requests   Final    BOTTLES DRAWN AEROBIC AND ANAEROBIC Blood Culture adequate volume Performed at [facility], [address], [place], [place] [address]    Culture   Final    NO GROWTH < 24 HOURS Performed at [facility], [address], [place], [place] [address]    Report Status PENDING  Incomplete  Culture, blood (Routine X 2) w Reflex to ID Panel     Status: None (Preliminary result)   Collection Time: 10/30/23 12:19 PM   Specimen: BLOOD  Result Value Ref Range Status   Specimen Description   Final    BLOOD BLOOD LEFT HAND Performed at [facility], [address], [place], [place] [address]    Special Requests   Final    BOTTLES DRAWN AEROBIC AND ANAEROBIC Blood Culture adequate volume Performed at [facility], [address], [place], [place] [address]    Culture   Final    NO GROWTH < 24 HOURS Performed at [facility], [address] [place], [place] [address]    Report Status PENDING  Incomplete         Radiology Studies: CT RENAL STONE STUDY  Result Date: 10/30/2023 CLINICAL DATA:  Abdominal and flank pain.  Hematuria.  EXAM: CT ABDOMEN AND PELVIS WITHOUT CONTRAST TECHNIQUE: Multidetector CT imaging of the abdomen and pelvis was performed following the standard protocol without IV contrast. RADIATION DOSE REDUCTION: This exam was performed according to the departmental dose-optimization program which includes automated exposure control, adjustment of the mA and/or kV according to patient size and/or use of iterative reconstruction technique. COMPARISON:  None Available. FINDINGS:  Lower chest: There are trace bilateral pleural effusions. There is a stable 5 mm nodular density in the right lower lobe image 7/26. Hepatobiliary: No focal liver abnormality is seen. No gallstones, gallbladder wall thickening, or biliary dilatation. Pancreas: Unremarkable. No pancreatic ductal dilatation or surrounding inflammatory changes. Spleen: Normal in size without focal abnormality. Adrenals/Urinary Tract: There is a small amount of layering hyperdensity in the bladder. There is asymmetric wall thickening of the inferior right bladder measuring up to 15 mm. There is left renal atrophy with some adjacent presumed scarring laterally. Left renal cyst measuring 10 mm is unchanged. There is no hydronephrosis. There are punctate calculi in the right kidney measuring up to 5 mm. There is an exophytic cyst in the inferior pole measuring 4.6 cm. There is no hydronephrosis. The adrenal glands are within normal limits. Stomach/Bowel: Stomach is within normal limits. Appendix a is not seen ppears normal. No evidence of bowel wall thickening, distention, or inflammatory changes. There is sigmoid and descending colon diverticulosis. Vascular/Lymphatic: Aortic atherosclerosis. No enlarged abdominal or pelvic lymph nodes. Reproductive: Prostate radiotherapy seeds are present. Other: There is mild stranding and minimal fluid in the pelvis, nonspecific. There is a small fat containing umbilical hernia. Musculoskeletal: The bones are osteopenic. L1-L4 posterior fusion hardware is present. IMPRESSION: 1. Asymmetric wall thickening of the inferior right bladder worrisome for neoplasm. Recommend cystoscopy. 2. Small amount of layering hyperdensity in the bladder worrisome for blood products. 3. Nonobstructing right renal calculi. 4. Left renal atrophy with adjacent presumed scarring. 5. Bilateral renal cysts. No follow-up imaging recommended. 6. Colonic diverticulosis. 7. Trace bilateral pleural effusions. 8. Stable 5 mm nodular  density in the right lower lobe. 9. Mild stranding and minimal fluid in the pelvis, nonspecific. Aortic Atherosclerosis (ICD10-I70.0). Electronically Signed   By: Darliss Cheney M.D.   On: 10/30/2023 20:28   DG CHEST PORT 1 VIEW  Result Date: 10/30/2023 CLINICAL DATA:  Cough EXAM: PORTABLE CHEST 1 VIEW COMPARISON:  10/28/2023 FINDINGS: Cardiomegaly status post median sternotomy and CABG. Mild diffuse interstitial opacity. Focal airspace opacity. No acute osseous findings. IMPRESSION: Cardiomegaly with mild diffuse interstitial opacity, likely edema. No focal airspace opacity. Electronically Signed   By: Jearld Lesch M.D.   On: 10/30/2023 17:45        Scheduled Meds:  acetaminophen  1,000 mg Oral TID   cholecalciferol  5,000 Units Oral Daily   DULoxetine  20 mg Oral BID   heparin injection (subcutaneous)  5,000 Units Subcutaneous Q8H   insulin aspart  0-5 Units Subcutaneous QHS   insulin aspart  0-9 Units Subcutaneous TID WC   midodrine  5 mg Oral TID WC   multivitamin with minerals  1 tablet Oral QHS   polyvinyl alcohol  1 drop Left Eye BID   Ensure Max Protein  11 oz Oral Daily   sodium chloride flush  10 mL Intravenous Q12H   tamsulosin  0.4 mg Oral BID   Continuous Infusions:     LOS: 1 day    Time spent: 40 minutes    Ramiro Harvest, MD [facility]   To contact the attending provider  between 7A-7P or the covering provider during after hours 7P-7A, please log into the web site [email or web address] and access using universal [place] password for that web site. If you do not have the password, please call the hospital operator.  10/31/2023, 1:19 PM

## 2023-11-01 DIAGNOSIS — N179 Acute kidney failure, unspecified: Secondary | ICD-10-CM | POA: Diagnosis not present

## 2023-11-01 DIAGNOSIS — R55 Syncope and collapse: Secondary | ICD-10-CM | POA: Diagnosis not present

## 2023-11-01 DIAGNOSIS — G9341 Metabolic encephalopathy: Secondary | ICD-10-CM | POA: Diagnosis not present

## 2023-11-01 DIAGNOSIS — I959 Hypotension, unspecified: Secondary | ICD-10-CM | POA: Diagnosis not present

## 2023-11-01 LAB — BPAM RBC
Blood Product Expiration Date: 202411292359
ISSUE DATE / TIME: 202411101247
Unit Type and Rh: 5100

## 2023-11-01 LAB — CBC
HCT: 29.7 % — ABNORMAL LOW (ref 39.0–52.0)
Hemoglobin: 9.6 g/dL — ABNORMAL LOW (ref 13.0–17.0)
MCH: 30.7 pg (ref 26.0–34.0)
MCHC: 32.3 g/dL (ref 30.0–36.0)
MCV: 94.9 fL (ref 80.0–100.0)
Platelets: 322 10*3/uL (ref 150–400)
RBC: 3.13 MIL/uL — ABNORMAL LOW (ref 4.22–5.81)
RDW: 15 % (ref 11.5–15.5)
WBC: 11.3 10*3/uL — ABNORMAL HIGH (ref 4.0–10.5)
nRBC: 0 % (ref 0.0–0.2)

## 2023-11-01 LAB — TYPE AND SCREEN
ABO/RH(D): O POS
Antibody Screen: NEGATIVE
Unit division: 0

## 2023-11-01 LAB — BASIC METABOLIC PANEL
Anion gap: 10 (ref 5–15)
BUN: 15 mg/dL (ref 8–23)
CO2: 24 mmol/L (ref 22–32)
Calcium: 9.9 mg/dL (ref 8.9–10.3)
Chloride: 99 mmol/L (ref 98–111)
Creatinine, Ser: 1.11 mg/dL (ref 0.61–1.24)
GFR, Estimated: >60 mL/min (ref >60)
Glucose, Bld: 128 mg/dL — ABNORMAL HIGH (ref 70–99)
Potassium: 3.5 mmol/L (ref 3.5–5.1)
Sodium: 133 mmol/L — ABNORMAL LOW (ref 135–145)

## 2023-11-01 LAB — CULTURE, BLOOD (ROUTINE X 2)

## 2023-11-01 LAB — GLUCOSE, CAPILLARY
Glucose-Capillary: 121 mg/dL — ABNORMAL HIGH (ref 70–99)
Glucose-Capillary: 168 mg/dL — ABNORMAL HIGH (ref 70–99)
Glucose-Capillary: 117 mg/dL — ABNORMAL HIGH (ref 70–99)
Glucose-Capillary: 134 mg/dL — ABNORMAL HIGH (ref 70–99)

## 2023-11-01 MED ORDER — MIDODRINE HCL 5 MG PO TABS
10.0000 mg | ORAL_TABLET | Freq: Three times a day (TID) | ORAL | Status: DC
Start: 2023-11-01 — End: 2023-11-05
  Administered 2023-11-01 – 2023-11-05 (×11): 10 mg via ORAL
  Filled 2023-11-01 (×11): qty 2

## 2023-11-01 NOTE — Plan of Care (Signed)
  Problem: Education: Goal: Ability to describe self-care measures that may prevent or decrease complications (Diabetes Survival Skills Education) will improve Outcome: Not Progressing Goal: Individualized Educational Video(s) Outcome: Not Progressing   Problem: Coping: Goal: Ability to adjust to condition or change in health will improve Outcome: Not Progressing   Problem: Fluid Volume: Goal: Ability to maintain a balanced intake and output will improve Outcome: Not Progressing   

## 2023-11-01 NOTE — Progress Notes (Addendum)
Subjective: Pt slept through rounds and conversation with his wife.   Objective: Vital signs in last 24 hours: Temp:  [98.2 F (36.8 C)-98.9 F (37.2 C)] 98.7 F (37.1 C) (11/11 0457) Pulse Rate:  [58-104] 58 (11/10 1920) Resp:  [19-20] 20 (11/11 0457) BP: (86-115)/(58-75) 115/63 (11/11 0457) SpO2:  [94 %-96 %] 95 % (11/11 0457)  Assessment/Plan: #hx muscle invasive bladder cancer TURBT 5/24-6 treatments of BCG Has not been able to follow-up for surveillance cystoscopy due to multiple unrelated hospitalizations. Bladder mass on CT/AP. Scheduled for outpatient cystoscopy.  # Hematuria Cleared as of the date of consult-10/31/2023.  There were no samples available in room and neither he or his wife could account for any ongoing bleeding. Will recommend strict I's and O's and string of bottles.  Reassess tomorrow morning.  Intake/Output from previous day: 11/10 0701 - 11/11 0700 In: 725 [P.O.:410; Blood:315] Out: 1650 [Urine:1650]  Intake/Output this shift: No intake/output data recorded.  Physical Exam:  General: sleeping CV: No cyanosis Lungs: equal chest rise  Lab Results: Recent Labs    10/31/23 0636 10/31/23 1835 11/01/23 0527  HGB 7.9* 9.5* 9.6*  HCT 26.0* 30.4* 29.7*   BMET Recent Labs    10/31/23 0636 11/01/23 0527  NA 135 133*  K 3.7 3.5  CL 103 99  CO2 24 24  GLUCOSE 122* 128*  BUN 15 15  CREATININE 1.08 1.11  CALCIUM 10.2 9.9     Studies/Results: CT RENAL STONE STUDY  Result Date: 10/30/2023 CLINICAL DATA:  Abdominal and flank pain.  Hematuria. EXAM: CT ABDOMEN AND PELVIS WITHOUT CONTRAST TECHNIQUE: Multidetector CT imaging of the abdomen and pelvis was performed following the standard protocol without IV contrast. RADIATION DOSE REDUCTION: This exam was performed according to the departmental dose-optimization program which includes automated exposure control, adjustment of the mA and/or kV according to patient size and/or use of  iterative reconstruction technique. COMPARISON:  None Available. FINDINGS: Lower chest: There are trace bilateral pleural effusions. There is a stable 5 mm nodular density in the right lower lobe image 7/26. Hepatobiliary: No focal liver abnormality is seen. No gallstones, gallbladder wall thickening, or biliary dilatation. Pancreas: Unremarkable. No pancreatic ductal dilatation or surrounding inflammatory changes. Spleen: Normal in size without focal abnormality. Adrenals/Urinary Tract: There is a small amount of layering hyperdensity in the bladder. There is asymmetric wall thickening of the inferior right bladder measuring up to 15 mm. There is left renal atrophy with some adjacent presumed scarring laterally. Left renal cyst measuring 10 mm is unchanged. There is no hydronephrosis. There are punctate calculi in the right kidney measuring up to 5 mm. There is an exophytic cyst in the inferior pole measuring 4.6 cm. There is no hydronephrosis. The adrenal glands are within normal limits. Stomach/Bowel: Stomach is within normal limits. Appendix a is not seen ppears normal. No evidence of bowel wall thickening, distention, or inflammatory changes. There is sigmoid and descending colon diverticulosis. Vascular/Lymphatic: Aortic atherosclerosis. No enlarged abdominal or pelvic lymph nodes. Reproductive: Prostate radiotherapy seeds are present. Other: There is mild stranding and minimal fluid in the pelvis, nonspecific. There is a small fat containing umbilical hernia. Musculoskeletal: The bones are osteopenic. L1-L4 posterior fusion hardware is present. IMPRESSION: 1. Asymmetric wall thickening of the inferior right bladder worrisome for neoplasm. Recommend cystoscopy. 2. Small amount of layering hyperdensity in the bladder worrisome for blood products. 3. Nonobstructing right renal calculi. 4. Left renal atrophy with adjacent presumed scarring. 5. Bilateral renal cysts. No  follow-up imaging recommended. 6. Colonic  diverticulosis. 7. Trace bilateral pleural effusions. 8. Stable 5 mm nodular density in the right lower lobe. 9. Mild stranding and minimal fluid in the pelvis, nonspecific. Aortic Atherosclerosis (ICD10-I70.0). Electronically Signed   By: Darliss Cheney M.D.   On: 10/30/2023 20:28   DG CHEST PORT 1 VIEW  Result Date: 10/30/2023 CLINICAL DATA:  Cough EXAM: PORTABLE CHEST 1 VIEW COMPARISON:  10/28/2023 FINDINGS: Cardiomegaly status post median sternotomy and CABG. Mild diffuse interstitial opacity. Focal airspace opacity. No acute osseous findings. IMPRESSION: Cardiomegaly with mild diffuse interstitial opacity, likely edema. No focal airspace opacity. Electronically Signed   By: Jearld Lesch M.D.   On: 10/30/2023 17:45      LOS: 2 days   Elmon Kirschner, NP [facility] Pager: [phone]  11/01/2023, 10:51 AM   He has a history of nonmuscle invasive bladder cancer.  Reviewed CT imaging.  Likely recurrence on recent CT.  Will arrange for outpatient follow-up for cystoscopy and likely reschedule TURBT.  Hematuria is clearing.  Hopefully can wean catheter.  Matt R. Dragan Tamburrino MD [facility]  Pager: [phone]

## 2023-11-01 NOTE — Progress Notes (Signed)
Physical Therapy Treatment Patient Details Name: [name] MRN: [id number] DOB: 06-Apr-1934 Today's Date: 11/01/2023   History of Present Illness 89 y.o. male admitted with acute metabolic encephalopathy, AKI, transient hypotension and near syncope event. PMH: prostate cancer status post radiation, CKD stage II-IIIa, renal cell carcinoma status post nephrectomy, bladder cancer, hypertension, hyperlipidemia, CAD status post CABG, type 2 diabetes, chronic HFrEF, history of TAVR, chronic back pain on opiate, gout, GERD.  Admittion 10/3-10/10 for surgical wound infection of his back with cellulitis and also UTI.    PT Comments  Pt making gradual progress but continues to be limited by orthostatic hypotension, fatigue, and back pain.  He was able to tolerate transfer to chair with assist and increased time, not able to tolerate further ambulation.  Pt had c/o back pain when up - encouraged him (and family encouraged) to at least try to sit up for an hour in chair, notified RN.   BP as follows:  99/55 (69) supine 78/57 (65) sitting 83/56 (66) sitting 3 mins after exercises 80/66 (70) after tx to chair 92/59 (69) 3 min in chair    If plan is discharge home, recommend the following: A lot of help with bathing/dressing/bathroom;Assistance with cooking/housework;Assist for transportation;Help with stairs or ramp for entrance;A lot of help with walking and/or transfers   Can travel by private vehicle     No  Equipment Recommendations  Wheelchair cushion (measurements PT);Wheelchair (measurements PT)    Recommendations for Other Services       Precautions / Restrictions Precautions Precautions: Fall Precaution Comments: back surgery 08/2023     Mobility  Bed Mobility Overal bed mobility: Needs Assistance Bed Mobility: Sidelying to Sit   Sidelying to sit: Min assist       General bed mobility comments: increased time and cues    Transfers Overall transfer level: Needs  assistance Equipment used: Rolling walker (2 wheels) Transfers: Sit to/from Stand, Bed to chair/wheelchair/BSC Sit to Stand: Min assist   Step pivot transfers: Min assist       General transfer comment: Increased time, cues for RW use, step pivot to chair with assist for balance and RW.  Performed STS x 2    Ambulation/Gait               General Gait Details: unable to tolerate (fatigue and back pain)   Stairs             Wheelchair Mobility     Tilt Bed    Modified Rankin (Stroke Patients Only)       Balance Overall balance assessment: Needs assistance Sitting-balance support: Feet supported, No upper extremity supported Sitting balance-Leahy Scale: Good     Standing balance support: Bilateral upper extremity supported Standing balance-Leahy Scale: Poor Standing balance comment: Needs RW and min A                            Cognition Arousal: Alert Behavior During Therapy: Flat affect Overall Cognitive Status: Impaired/Different from baseline                   Orientation Level: Disoriented to, Time   Memory: Decreased short-term memory   Safety/Judgement: Decreased awareness of safety Awareness: Emergent   General Comments: Slow to respond        Exercises Other Exercises Other Exercises: At EOB performing AROM 10x2 bil: shoulder press, LAQ, ankle pump, hip flexion prior to standing to help decrease orthostatic hypotension  General Comments        Pertinent Vitals/Pain Pain Assessment Pain Assessment: Faces Faces Pain Scale: Hurts even more Pain Location: back Pain Descriptors / Indicators: Aching, Dull, Grimacing Pain Intervention(s): Limited activity within patient's tolerance, Monitored during session, Repositioned, Premedicated before session    Home Living                          Prior Function            PT Goals (current goals can now be found in the care plan section) Progress towards  PT goals: Progressing toward goals    Frequency    Min 1X/week      PT Plan      Co-evaluation              AM-PAC PT "6 Clicks" Mobility   Outcome Measure  Help needed turning from your back to your side while in a flat bed without using bedrails?: None Help needed moving from lying on your back to sitting on the side of a flat bed without using bedrails?: A Little Help needed moving to and from a bed to a chair (including a wheelchair)?: A Lot Help needed standing up from a chair using your arms (e.g., wheelchair or bedside chair)?: A Lot Help needed to walk in hospital room?: Total Help needed climbing 3-5 steps with a railing? : Total 6 Click Score: 13    End of Session Equipment Utilized During Treatment: Gait belt Activity Tolerance: Patient limited by fatigue Patient left: with chair alarm set;in chair;with call bell/phone within reach Nurse Communication: Mobility status PT Visit Diagnosis: Difficulty in walking, not elsewhere classified (R26.2);Pain     Time: 2130-8657 PT Time Calculation (min) (ACUTE ONLY): 29 min  Charges:    $Therapeutic Activity: 23-37 mins PT General Charges $$ ACUTE PT VISIT: 1 Visit                     Anise Salvo, PT [facility] [phone]    Rayetta Humphrey 11/01/2023, 1:13 PM

## 2023-11-01 NOTE — Progress Notes (Addendum)
PROGRESS NOTE    [name]  NWG:[id number] DOB: [birth year] DOA: 10/28/2023 PCP: [name], MD   Chief Complaint  Patient presents with   Hypotension    Brief Narrative:  Patient 89 year old gentleman history of prostate cancer status post radiation, CKD stage II-3A, renal cell carcinoma status post nephrectomy, bladder cancer, hypertension, hyperlipidemia, CAD status post CABG, type 2 diabetes, chronic HFrEF, history of TAVR, chronic back pain on chronic opiates, gout, GERD recently admitted 09/23/2023-09/30/2023 for surgical wound infection of the back with cellulitis and UTI.  Patient also noted to be hypotensive during that hospitalization and treated with midodrine which was discontinued prior to discharge.  Patient presented to the ED with transient hypotension which responded to IV fluids, dysuria, generalized weakness x 1 week.  Family also with concerns for confusion over the past few days and near syncopal episode.  Patient noted to have had decreased oral intake.  Patient seen in the ED, placed on IV fluids, COVID-19 PCR, influenza AMB PCR, RSV PCR negative.  Blood cultures obtained.  Urinalysis also obtained as well as urine cultures.  Patient also noted to be in acute kidney injury.  Head CT negative.  Patient given Percocet, vancomycin, cefepime, metronidazole in the ED and patient admitted for further evaluation and management.   Assessment & Plan:   Principal Problem:   Acute renal failure superimposed on stage 3a chronic kidney disease (HCC) Active Problems:   Essential hypertension   Diabetes mellitus (HCC)   Transient hypotension   Acute metabolic encephalopathy   Near syncope   Physical deconditioning   Hematuria   AMS (altered mental status)  #1 AKI on CKD stage II-IIIa -Blood secondary to a prerenal azotemia in the setting of poor oral intake, dehydration and diuretics. -Creatinine on admission noted at 1.5 with baseline 1.0-1.2. -Urinalysis done with trace  leukocytes, nitrite negative, 11-20 WBCs. -Urine cultures obtained. -Patient was on IV fluids. -Renal function slowly trending down creatinine at 1.11. -Continue to hold torsemide. -Patient with cough on 10/30/2023 and due to concerns for volume overload patient given Lasix 20 mg IV x 1 with urine output of 1.5 L.  -Patient also transfused a unit of PRBCs and given Lasix 20 mg IV x 1 posttransfusion on 10/31/2023 with a urine output of 1.650 L over the past 24 hours.  2.  Hematuria/abnormal CT renal stone protocol -Per RN patient with hematuria early on during the hospitalization which seems to be clearing up.  -Patient with complaints of back pain however also does have chronic back pain. -CT renal stone protocol with asymmetric wall thickening of the inferior right bladder worrisome for neoplasm.  Recommend cystoscopy.  Small amount of layering hyperdensity in the bladder worrisome for blood products.  Nonobstructing right renal calculi.  Left renal atrophy with adjacent presumed scarring.  Bilateral renal cysts.  Colonic diverticulosis.  Trace bilateral pleural effusions.   -Patient with prior history of bladder cancer, renal cell carcinoma status post nephrectomy, now with some hematuria and abnormal CT scan concerning for recurrent bladder cancer.   -Patient seen in consultation by urology who are recommending outpatient follow-up for cystoscopy to evaluate bladder mass.  -Supportive care.  3.  Acute metabolic encephalopathy -Per family patient noted to have some confusion. -Likely multifactorial secondary to dehydration in the setting of chronic opioid use for back pain. -Patient received IV antibiotics in the ED, no obvious infection noted at this time as patient with no fever, no leukocytosis, no meningeal signs. -Patient with no respiratory  symptoms, chest x-ray negative for any acute infiltrates. -Urinalysis not strongly suggestive of UTI with urine cultures pending. -Patient with no  signs of infection of lower back surgical wound site at this time. -CT head negative for any acute abnormalities. -Patient with recent labs a month ago with TSH, vitamin B12, B1 levels within normal limits. -Ammonia levels within normal limits. -Initial blood cultures drawn on admission with MRSE likely contaminant. -Repeat blood cultures x 2 pending with no growth to date.. -Patient improving clinically but slowly. -Patient with slow responses to questions today... -Minimize narcotics. -If no significant improvement may need to consider MRI of the brain. -Supportive care.  4.  Transient hypotension -Patient presentation noted to have a transient hypotension which responded to IV fluids. -Diuretics of torsemide on hold. -BP fluctuating noted to have been on midodrine before in the past. -Concern for volume overload and as such saline lock IV fluids. -BP borderline, patient given Lasix 20 mg IV x 1 on 10/30/2023 and as such patient started on midodrine 5 mg 3 times daily. -Increase midodrine to 10 mg 3 times daily.  5.  Near syncope -As noted per admitting physician (ED report family reported near syncopal episode the day prior to admission likely secondary to dehydration and hypotension. -CT head with no acute abnormalities. -Patient with no acute focal neurological deficits. -Patient not hypoxic with no shortness of breath and low probability for PE. -Clinical improvement with hydration.  6.  Generalized weakness/physical deconditioning -PT/OT.  7.  Hypertension -Continue to hold Toprol-XL, torsemide.   8.  Diabetes mellitus type 2 -Hemoglobin A1c 6.4 (10/28/2023) -CBG 121 this morning. -Continue to hold oral hypoglycemic agents. -SSI.  9.  Chronic back pain -Patient noted to be on chronic opioids for a while for his chronic back pain. -Due to concerns opioid may be contributing to patient's altered mental status on presentation and as such patient placed on oxycodone 5 mg  every 4 hours as needed pain.   -Continue scheduled Tylenol 1000 mg 3 times daily.   -Follow  10.  Chronic HFrEF -2D echo from 08/05/2023 with a EF of 30%, grade 1 diastolic dysfunction, right ventricular systolic function moderately reduced, mild MVR, trivial AVR.  Patient with no signs of significant volume overload on exam however patient with coughing on 11/8-11/9 and felt likely secondary to volume overload.. -Diuretics held on admission. -Status post Lasix 20 mg IV x 1 10/30/2023 and Lasix 20 mg IV x 1 on 10/31/2023, with a urine output of 1.650 L over the past 24 hours. -Saline lock IV fluids.  11.  Normocytic anemia -Patient noted with some hematuria per RN. -Hemoglobin currently at 9.6. -Anemia panel with iron of 32, TIBC of 237, ferritin of 429, folate of 20.4, vitamin B12 of 385. -Transfuse 1 unit PRBCs. -Follow H&H.  12.  GERD -Continue Pepcid.    13.  CAD status post CABG -Stable. -Continue to hold home regimen aspirin in light of concern for hematuria. -Due to transient hypotension on admission continue to hold Toprol-XL and Demadex.    DVT prophylaxis: Heparin>>>> SCDs Code Status: Full Family Communication: Updated patient, wife, niece at bedside. Disposition:   Status is: Inpatient    Consultants:  Urology: Dr. Ronne Binning 10/31/2023  Procedures:  CT head 10/28/2023 Chest x-ray 10/28/2023 CT renal stone protocol 10/30/2023 Transfuse 1 unit PRBCs 10/31/2023  Antimicrobials:  Anti-infectives (From admission, onward)    Start     Dose/Rate Route Frequency Ordered Stop   10/28/23 1300  vancomycin (VANCOREADY)  IVPB 1500 mg/300 mL        1,500 mg 150 mL/hr over 120 Minutes Intravenous  Once 10/28/23 1240 10/28/23 1900   10/28/23 1245  ceFEPIme (MAXIPIME) 2 g in sodium chloride 0.9 % 100 mL IVPB        2 g 200 mL/hr over 30 Minutes Intravenous  Once 10/28/23 1236 10/28/23 1513   10/28/23 1245  metroNIDAZOLE (FLAGYL) IVPB 500 mg        500 mg 100 mL/hr over 60  Minutes Intravenous  Once 10/28/23 1236 10/28/23 1406   10/28/23 1245  vancomycin (VANCOCIN) IVPB 1000 mg/200 mL premix  Status:  Discontinued        1,000 mg 200 mL/hr over 60 Minutes Intravenous  Once 10/28/23 1236 10/28/23 1239         Subjective: Patient sitting up in chair sleeping but easily arousable.  Denies any chest pain or shortness of breath.  No abdominal pain.  Slow to respond with his answers however alert and oriented to self place and time.  Wife at bedside.  Hematuria improving.   Objective: Vitals:   10/31/23 1345 10/31/23 1530 10/31/23 1920 11/01/23 0457  BP: 105/75 105/75 (!) 86/58 115/63  Pulse:  (!) 104 (!) 58   Resp:  20 20 20   Temp:  98.2 F (36.8 C) 98.3 F (36.8 C) 98.7 F (37.1 C)  TempSrc:  Oral Oral Oral  SpO2:  96% 95% 95%  Weight:      Height:        Intake/Output Summary (Last 24 hours) at 11/01/2023 1231 Last data filed at 11/01/2023 0133 Gross per 24 hour  Intake 615 ml  Output 1150 ml  Net -535 ml   Filed Weights   10/28/23 1247  Weight: 74 kg    Examination:  General exam: NAD. Respiratory system: Lungs clear to auscultation bilaterally.  No wheezes, no crackles, no rhonchi.  Fair air movement.  Speaking in full sentences.   Cardiovascular system: Regular rate rhythm no murmurs rubs or gallops.  No JVD.  No lower extremity edema.  Gastrointestinal system: Abdomen is soft, nontender, nondistended, positive bowel sounds.  No rebound.  No guarding.  Central nervous system: Alert and oriented.  Moving extremities spontaneously.  No focal neurological deficits.   Extremities: Symmetric 5 x 5 power. Skin: No rashes, lesions or ulcers Psychiatry: Judgement and insight appear normal. Mood & affect appropriate.     Data Reviewed: I have personally reviewed following labs and imaging studies  CBC: Recent Labs  Lab 10/28/23 1227 10/29/23 0604 10/30/23 0708 10/31/23 0636 10/31/23 1835 11/01/23 0527  WBC 10.0 9.1 9.7 10.9*  --   11.3*  NEUTROABS 6.6  --  6.4 7.3  --   --   HGB 8.4* 8.1* 7.8* 7.9* 9.5* 9.6*  HCT 27.3* 26.6* 25.0* 26.0* 30.4* 29.7*  MCV 98.2 98.9 97.7 98.1  --  94.9  PLT 328 320 309 314  --  322    Basic Metabolic Panel: Recent Labs  Lab 10/28/23 1227 10/29/23 0604 10/30/23 0708 10/31/23 0636 11/01/23 0527  NA 136 138 141 135 133*  K 3.6 3.6 4.0 3.7 3.5  CL 101 104 109 103 99  CO2 26 25 23 24 24   GLUCOSE 147* 125* 138* 122* 128*  BUN 32* 28* 18 15 15   CREATININE 1.56* 1.38* 1.16 1.08 1.11  CALCIUM 10.3 10.6* 10.5* 10.2 9.9  MG  --  2.0  --   --   --  GFR: Estimated Creatinine Clearance: 42.3 mL/min (by C-G formula based on SCr of 1.11 mg/dL).  Liver Function Tests: Recent Labs  Lab 10/28/23 1227  AST 16  ALT 8  ALKPHOS 49  BILITOT 0.4  PROT 6.6  ALBUMIN 2.4*    CBG: Recent Labs  Lab 10/31/23 1157 10/31/23 1705 10/31/23 2024 11/01/23 0747 11/01/23 1147  GLUCAP 103* 110* 152* 121* 168*     Recent Results (from the past 240 hour(s))  Resp panel by RT-PCR (RSV, Flu A&B, Covid) Anterior Nasal Swab     Status: None   Collection Time: 10/28/23 12:45 PM   Specimen: Anterior Nasal Swab  Result Value Ref Range Status   SARS Coronavirus 2 by RT PCR NEGATIVE NEGATIVE Final    Comment: (NOTE) SARS-CoV-2 target nucleic acids are NOT DETECTED.  The SARS-CoV-2 RNA is generally detectable in upper respiratory specimens during the acute phase of infection. The lowest concentration of SARS-CoV-2 viral copies this assay can detect is 138 copies/mL. A negative result does not preclude SARS-Cov-2 infection and should not be used as the sole basis for treatment or other patient management decisions. A negative result may occur with  improper specimen collection/handling, submission of specimen other than nasopharyngeal swab, presence of viral mutation(s) within the areas targeted by this assay, and inadequate number of viral copies(<138 copies/mL). A negative result must be  combined with clinical observations, patient history, and epidemiological information. The expected result is Negative.  Fact Sheet for Patients:  BloggerCourse.com  Fact Sheet for Healthcare Providers:  SeriousBroker.it  This test is no t yet approved or cleared by the [place] FDA and  has been authorized for detection and/or diagnosis of SARS-CoV-2 by FDA under an Emergency Use Authorization (EUA). This EUA will remain  in effect (meaning this test can be used) for the duration of the COVID-19 declaration under Section 564(b)(1) of the Act, 21 U.S.C.section 360bbb-3(b)(1), unless the authorization is terminated  or revoked sooner.       Influenza A by PCR NEGATIVE NEGATIVE Final   Influenza B by PCR NEGATIVE NEGATIVE Final    Comment: (NOTE) The Xpert Xpress SARS-CoV-2/FLU/RSV plus assay is intended as an aid in the diagnosis of influenza from Nasopharyngeal swab specimens and should not be used as a sole basis for treatment. Nasal washings and aspirates are unacceptable for Xpert Xpress SARS-CoV-2/FLU/RSV testing.  Fact Sheet for Patients: BloggerCourse.com  Fact Sheet for Healthcare Providers: SeriousBroker.it  This test is not yet approved or cleared by the [place] FDA and has been authorized for detection and/or diagnosis of SARS-CoV-2 by FDA under an Emergency Use Authorization (EUA). This EUA will remain in effect (meaning this test can be used) for the duration of the COVID-19 declaration under Section 564(b)(1) of the Act, 21 U.S.C. section 360bbb-3(b)(1), unless the authorization is terminated or revoked.     Resp Syncytial Virus by PCR NEGATIVE NEGATIVE Final    Comment: (NOTE) Fact Sheet for Patients: BloggerCourse.com  Fact Sheet for Healthcare Providers: SeriousBroker.it  This test is not yet  approved or cleared by the [place] FDA and has been authorized for detection and/or diagnosis of SARS-CoV-2 by FDA under an Emergency Use Authorization (EUA). This EUA will remain in effect (meaning this test can be used) for the duration of the COVID-19 declaration under Section 564(b)(1) of the Act, 21 U.S.C. section 360bbb-3(b)(1), unless the authorization is terminated or revoked.  Performed at [facility], [address], [place], [place] [address]  Blood Culture (routine x 2)     Status: Abnormal   Collection Time: 10/28/23 12:50 PM   Specimen: BLOOD  Result Value Ref Range Status   Specimen Description   Final    BLOOD SITE NOT SPECIFIED Performed at [facility], [address], [place], [place] [address]    Special Requests   Final    BOTTLES DRAWN AEROBIC AND ANAEROBIC Blood Culture results may not be optimal due to an inadequate volume of blood received in culture bottles Performed at [facility], [address], [place], [place] [address]    Culture  Setup Time   Final    GRAM POSITIVE COCCI BOTTLES DRAWN AEROBIC ONLY CRITICAL RESULT CALLED TO, READ BACK BY AND VERIFIED WITH: M LILLISTON,PHARMD@[email or web address]  10/30/23 MK    Culture (A)  Final    STAPHYLOCOCCUS EPIDERMIDIS THE SIGNIFICANCE OF ISOLATING THIS ORGANISM FROM A SINGLE SET OF BLOOD CULTURES WHEN MULTIPLE SETS ARE DRAWN IS UNCERTAIN. PLEASE NOTIFY THE MICROBIOLOGY DEPARTMENT WITHIN ONE WEEK IF SPECIATION AND SENSITIVITIES ARE REQUIRED. Performed at [facility], [address] [place], [place] [address]    Report Status 11/01/2023 FINAL  Final  Blood Culture ID Panel (Reflexed)     Status: Abnormal   Collection Time: 10/28/23 12:50 PM  Result Value Ref Range Status   Enterococcus faecalis NOT DETECTED NOT DETECTED Final   Enterococcus Faecium NOT DETECTED NOT DETECTED Final   Listeria monocytogenes NOT DETECTED NOT DETECTED Final    Staphylococcus species DETECTED (A) NOT DETECTED Final    Comment: CRITICAL RESULT CALLED TO, READ BACK BY AND VERIFIED WITH: M LILLISTON,PHARMD@0011  10/30/23 MK    Staphylococcus aureus (BCID) NOT DETECTED NOT DETECTED Final   Staphylococcus epidermidis DETECTED (A) NOT DETECTED Final    Comment: Methicillin (oxacillin) resistant coagulase negative staphylococcus. Possible blood culture contaminant (unless isolated from more than one blood culture draw or clinical case suggests pathogenicity). No antibiotic treatment is indicated for blood  culture contaminants. CRITICAL RESULT CALLED TO, READ BACK BY AND VERIFIED WITH: M LILLISTON,PHARMD@0011  10/30/23 MK    Staphylococcus lugdunensis NOT DETECTED NOT DETECTED Final   Streptococcus species NOT DETECTED NOT DETECTED Final   Streptococcus agalactiae NOT DETECTED NOT DETECTED Final   Streptococcus pneumoniae NOT DETECTED NOT DETECTED Final   Streptococcus pyogenes NOT DETECTED NOT DETECTED Final   A.calcoaceticus-baumannii NOT DETECTED NOT DETECTED Final   Bacteroides fragilis NOT DETECTED NOT DETECTED Final   Enterobacterales NOT DETECTED NOT DETECTED Final   Enterobacter cloacae complex NOT DETECTED NOT DETECTED Final   Escherichia coli NOT DETECTED NOT DETECTED Final   Klebsiella aerogenes NOT DETECTED NOT DETECTED Final   Klebsiella oxytoca NOT DETECTED NOT DETECTED Final   Klebsiella pneumoniae NOT DETECTED NOT DETECTED Final   Proteus species NOT DETECTED NOT DETECTED Final   Salmonella species NOT DETECTED NOT DETECTED Final   Serratia marcescens NOT DETECTED NOT DETECTED Final   Haemophilus influenzae NOT DETECTED NOT DETECTED Final   Neisseria meningitidis NOT DETECTED NOT DETECTED Final   Pseudomonas aeruginosa NOT DETECTED NOT DETECTED Final   Stenotrophomonas maltophilia NOT DETECTED NOT DETECTED Final   Candida albicans NOT DETECTED NOT DETECTED Final   Candida auris NOT DETECTED NOT DETECTED Final   Candida glabrata NOT  DETECTED NOT DETECTED Final   Candida krusei NOT DETECTED NOT DETECTED Final   Candida parapsilosis NOT DETECTED NOT DETECTED Final   Candida tropicalis NOT DETECTED NOT DETECTED Final   Cryptococcus neoformans/gattii NOT DETECTED NOT DETECTED Final  Methicillin resistance mecA/C DETECTED (A) NOT DETECTED Final    Comment: CRITICAL RESULT CALLED TO, READ BACK BY AND VERIFIED WITH: M LILLISTON,PHARMD@0011  10/30/23 MK Performed at [facility], [address] [place], [place] [address]   Blood Culture (routine x 2)     Status: None (Preliminary result)   Collection Time: 10/28/23  1:00 PM   Specimen: BLOOD  Result Value Ref Range Status   Specimen Description   Final    BLOOD SITE NOT SPECIFIED Performed at [facility], [address], [place], [place] [address]    Special Requests   Final    BOTTLES DRAWN AEROBIC AND ANAEROBIC Blood Culture adequate volume Performed at [facility], [address] [place], [place] [address]    Culture   Final    NO GROWTH 4 DAYS Performed at [facility], [address], [place], [place] [address]    Report Status PENDING  Incomplete  Urine Culture     Status: Abnormal   Collection Time: 10/28/23  2:39 PM   Specimen: Urine, Random  Result Value Ref Range Status   Specimen Description   Final    URINE, RANDOM Performed at [facility], [address] [place], [place] [address]    Special Requests   Final    NONE Reflexed from [id number] Performed at [facility], [address] [place], [place] [address]    Culture (A)  Final    <10,000 COLONIES/mL INSIGNIFICANT GROWTH Performed at [facility], [address] [place], [place] [address]    Report Status 10/29/2023 FINAL  Final  Urine Culture (for pregnant, neutropenic or urologic patients or patients with an indwelling urinary catheter)     Status: None   Collection Time: 10/29/23 12:03 PM    Specimen: Urine, Clean Catch  Result Value Ref Range Status   Specimen Description   Final    URINE, CLEAN CATCH Performed at [facility], [address], [place], [place] [address]    Special Requests   Final    NONE Performed at [facility], [address], [place], [place] [address]    Culture   Final    NO GROWTH Performed at [facility], [address] [place], [place] [address]    Report Status 10/30/2023 FINAL  Final  Culture, blood (Routine X 2) w Reflex to ID Panel     Status: None (Preliminary result)   Collection Time: 10/30/23 12:16 PM   Specimen: BLOOD  Result Value Ref Range Status   Specimen Description   Final    BLOOD BLOOD RIGHT ARM Performed at [facility], [address], [place], [place] [address]    Special Requests   Final    BOTTLES DRAWN AEROBIC AND ANAEROBIC Blood Culture adequate volume Performed at [facility], [address], [place], [place] [address]    Culture   Final    NO GROWTH 2 DAYS Performed at [facility], [address], [place], [place] [address]    Report Status PENDING  Incomplete  Culture, blood (Routine X 2) w Reflex to ID Panel     Status: None (Preliminary result)   Collection Time: 10/30/23 12:19 PM   Specimen: BLOOD  Result Value Ref Range Status   Specimen Description   Final    BLOOD BLOOD LEFT HAND Performed at [facility], [address], [place], [place] [address]    Special Requests   Final    BOTTLES DRAWN AEROBIC AND ANAEROBIC Blood Culture adequate volume Performed at [facility], [address], [place], [place] [address]    Culture   Final    NO GROWTH 2 DAYS Performed at [facility], [address] [place], [place] [address]    Report Status PENDING  Incomplete         Radiology Studies: CT RENAL STONE STUDY  Result Date: 10/30/2023 CLINICAL DATA:  Abdominal  and flank pain.  Hematuria. EXAM: CT ABDOMEN AND PELVIS WITHOUT CONTRAST TECHNIQUE: Multidetector CT imaging of the abdomen and pelvis was performed following the standard protocol without IV contrast. RADIATION DOSE REDUCTION: This exam was performed according to the departmental dose-optimization program which includes automated exposure control, adjustment of the mA and/or kV according to patient size and/or use of iterative reconstruction technique. COMPARISON:  None Available. FINDINGS: Lower chest: There are trace bilateral pleural effusions. There is a stable 5 mm nodular density in the right lower lobe image 7/26. Hepatobiliary: No focal liver abnormality is seen. No gallstones, gallbladder wall thickening, or biliary dilatation. Pancreas: Unremarkable. No pancreatic ductal dilatation or surrounding inflammatory changes. Spleen: Normal in size without focal abnormality. Adrenals/Urinary Tract: There is a small amount of layering hyperdensity in the bladder. There is asymmetric wall thickening of the inferior right bladder measuring up to 15 mm. There is left renal atrophy with some adjacent presumed scarring laterally. Left renal cyst measuring 10 mm is unchanged. There is no hydronephrosis. There are punctate calculi in the right kidney measuring up to 5 mm. There is an exophytic cyst in the inferior pole measuring 4.6 cm. There is no hydronephrosis. The adrenal glands are within normal limits. Stomach/Bowel: Stomach is within normal limits. Appendix a is not seen ppears normal. No evidence of bowel wall thickening, distention, or inflammatory changes. There is sigmoid and descending colon diverticulosis. Vascular/Lymphatic: Aortic atherosclerosis. No enlarged abdominal or pelvic lymph nodes. Reproductive: Prostate radiotherapy seeds are present. Other: There is mild stranding and minimal fluid in the pelvis, nonspecific. There is a small fat containing umbilical hernia. Musculoskeletal: The bones are  osteopenic. L1-L4 posterior fusion hardware is present. IMPRESSION: 1. Asymmetric wall thickening of the inferior right bladder worrisome for neoplasm. Recommend cystoscopy. 2. Small amount of layering hyperdensity in the bladder worrisome for blood products. 3. Nonobstructing right renal calculi. 4. Left renal atrophy with adjacent presumed scarring. 5. Bilateral renal cysts. No follow-up imaging recommended. 6. Colonic diverticulosis. 7. Trace bilateral pleural effusions. 8. Stable 5 mm nodular density in the right lower lobe. 9. Mild stranding and minimal fluid in the pelvis, nonspecific. Aortic Atherosclerosis (ICD10-I70.0). Electronically Signed   By: Darliss Cheney M.D.   On: 10/30/2023 20:28   DG CHEST PORT 1 VIEW  Result Date: 10/30/2023 CLINICAL DATA:  Cough EXAM: PORTABLE CHEST 1 VIEW COMPARISON:  10/28/2023 FINDINGS: Cardiomegaly status post median sternotomy and CABG. Mild diffuse interstitial opacity. Focal airspace opacity. No acute osseous findings. IMPRESSION: Cardiomegaly with mild diffuse interstitial opacity, likely edema. No focal airspace opacity. Electronically Signed   By: Jearld Lesch M.D.   On: 10/30/2023 17:45        Scheduled Meds:  acetaminophen  1,000 mg Oral TID   cholecalciferol  5,000 Units Oral Daily   DULoxetine  20 mg Oral BID   insulin aspart  0-5 Units Subcutaneous QHS   insulin aspart  0-9 Units Subcutaneous TID WC  midodrine  5 mg Oral TID WC   multivitamin with minerals  1 tablet Oral QHS   mouth rinse  15 mL Mouth Rinse 4 times per day   polyvinyl alcohol  1 drop Left Eye BID   Ensure Max Protein  11 oz Oral Daily   sodium chloride flush  10 mL Intravenous Q12H   tamsulosin  0.4 mg Oral BID   Continuous Infusions:     LOS: 2 days    Time spent: 40 minutes    Ramiro Harvest, MD [facility]   To contact the attending provider between 7A-7P or the covering provider during after hours 7P-7A, please log into the web site  [email or web address] and access using universal [place] password for that web site. If you do not have the password, please call the hospital operator.  11/01/2023, 12:31 PM

## 2023-11-02 DIAGNOSIS — G9341 Metabolic encephalopathy: Secondary | ICD-10-CM | POA: Diagnosis not present

## 2023-11-02 DIAGNOSIS — R55 Syncope and collapse: Secondary | ICD-10-CM | POA: Diagnosis not present

## 2023-11-02 DIAGNOSIS — I959 Hypotension, unspecified: Secondary | ICD-10-CM | POA: Diagnosis not present

## 2023-11-02 DIAGNOSIS — N179 Acute kidney failure, unspecified: Secondary | ICD-10-CM | POA: Diagnosis not present

## 2023-11-02 LAB — CBC WITH DIFFERENTIAL/PLATELET
Abs Immature Granulocytes: 0.03 10*3/uL (ref 0.00–0.07)
Basophils Absolute: 0.1 10*3/uL (ref 0.0–0.1)
Basophils Relative: 1 %
Eosinophils Absolute: 0.4 10*3/uL (ref 0.0–0.5)
Eosinophils Relative: 4 %
HCT: 29.5 % — ABNORMAL LOW (ref 39.0–52.0)
Hemoglobin: 9.3 g/dL — ABNORMAL LOW (ref 13.0–17.0)
Immature Granulocytes: 0 %
Lymphocytes Relative: 25 %
Lymphs Abs: 2.6 10*3/uL (ref 0.7–4.0)
MCH: 30.6 pg (ref 26.0–34.0)
MCHC: 31.5 g/dL (ref 30.0–36.0)
MCV: 97 fL (ref 80.0–100.0)
Monocytes Absolute: 0.9 10*3/uL (ref 0.1–1.0)
Monocytes Relative: 9 %
Neutro Abs: 6.1 10*3/uL (ref 1.7–7.7)
Neutrophils Relative %: 61 %
Platelets: 325 10*3/uL (ref 150–400)
RBC: 3.04 MIL/uL — ABNORMAL LOW (ref 4.22–5.81)
RDW: 15 % (ref 11.5–15.5)
WBC: 10 10*3/uL (ref 4.0–10.5)
nRBC: 0 % (ref 0.0–0.2)

## 2023-11-02 LAB — GLUCOSE, CAPILLARY
Glucose-Capillary: 118 mg/dL — ABNORMAL HIGH (ref 70–99)
Glucose-Capillary: 161 mg/dL — ABNORMAL HIGH (ref 70–99)
Glucose-Capillary: 105 mg/dL — ABNORMAL HIGH (ref 70–99)
Glucose-Capillary: 144 mg/dL — ABNORMAL HIGH (ref 70–99)

## 2023-11-02 LAB — CULTURE, BLOOD (ROUTINE X 2)
Culture: NO GROWTH
Special Requests: ADEQUATE

## 2023-11-02 LAB — RENAL FUNCTION PANEL
Albumin: 2.2 g/dL — ABNORMAL LOW (ref 3.5–5.0)
Anion gap: 10 (ref 5–15)
BUN: 16 mg/dL (ref 8–23)
CO2: 25 mmol/L (ref 22–32)
Calcium: 10.1 mg/dL (ref 8.9–10.3)
Chloride: 100 mmol/L (ref 98–111)
Creatinine, Ser: 1.08 mg/dL (ref 0.61–1.24)
GFR, Estimated: >60 mL/min (ref >60)
Glucose, Bld: 116 mg/dL — ABNORMAL HIGH (ref 70–99)
Phosphorus: 2.3 mg/dL — ABNORMAL LOW (ref 2.5–4.6)
Potassium: 3.6 mmol/L (ref 3.5–5.1)
Sodium: 135 mmol/L (ref 135–145)

## 2023-11-02 MED ORDER — K PHOS MONO-SOD PHOS DI & MONO 155-852-130 MG PO TABS
250.0000 mg | ORAL_TABLET | Freq: Two times a day (BID) | ORAL | Status: AC
  Administered 2023-11-02 – 2023-11-04 (×6): 250 mg via ORAL
  Filled 2023-11-02 (×6): qty 1

## 2023-11-02 MED ORDER — OXYCODONE HCL 5 MG PO TABS
2.5000 mg | ORAL_TABLET | Freq: Four times a day (QID) | ORAL | Status: DC | PRN
  Administered 2023-11-03 – 2023-11-04 (×3): 2.5 mg via ORAL
  Filled 2023-11-02 (×3): qty 1

## 2023-11-02 MED ORDER — ALBUMIN HUMAN 25 % IV SOLN
25.0000 g | Freq: Four times a day (QID) | INTRAVENOUS | Status: AC
  Administered 2023-11-02: 25 g via INTRAVENOUS
  Administered 2023-11-02: 12.5 g via INTRAVENOUS
  Administered 2023-11-03: 25 g via INTRAVENOUS
  Administered 2023-11-03: 12.5 g via INTRAVENOUS
  Filled 2023-11-02 (×4): qty 100

## 2023-11-02 MED ORDER — FAMOTIDINE 20 MG PO TABS
40.0000 mg | ORAL_TABLET | Freq: Every day | ORAL | Status: DC
  Administered 2023-11-02 – 2023-11-05 (×4): 40 mg via ORAL
  Filled 2023-11-02 (×4): qty 2

## 2023-11-02 MED ORDER — ALUM & MAG HYDROXIDE-SIMETH 200-200-20 MG/5ML PO SUSP
30.0000 mL | Freq: Once | ORAL | Status: AC
  Administered 2023-11-02: 30 mL via ORAL
  Filled 2023-11-02: qty 30

## 2023-11-02 MED ORDER — LIDOCAINE VISCOUS HCL 2 % MT SOLN
15.0000 mL | Freq: Once | OROMUCOSAL | Status: DC
  Filled 2023-11-02: qty 15

## 2023-11-02 MED ORDER — OXYCODONE HCL 5 MG PO TABS: 2.5000 mg | ORAL_TABLET | ORAL | Status: DC | PRN

## 2023-11-02 NOTE — Plan of Care (Signed)
  Problem: Education: Goal: Knowledge of General Education information will improve Description: Including pain rating scale, medication(s)/side effects and non-pharmacologic comfort measures Outcome: Progressing   Problem: Activity: Goal: Risk for activity intolerance will decrease Outcome: Progressing   Problem: Coping: Goal: Level of anxiety will decrease Outcome: Progressing   Problem: Pain Management: Goal: General experience of comfort will improve Outcome: Progressing   Problem: Safety: Goal: Ability to remain free from injury will improve Outcome: Progressing   Problem: Skin Integrity: Goal: Risk for impaired skin integrity will decrease Outcome: Progressing

## 2023-11-02 NOTE — Progress Notes (Signed)
OT Cancellation Note  Patient Details Name: [name] MRN: [id number] DOB: 02-21-1934   Cancelled Treatment:    Reason Eval/Treat Not Completed: Fatigue/lethargy limiting ability to participate. Chart reviewed, pt fatigued and lethargic after meds, spouse is requesting pt be allowed to rest. OT will check back as able.  [name] L. Mylik Pro, OTR/L  11/02/23, 1:58 PM

## 2023-11-02 NOTE — Progress Notes (Signed)
     Subjective: Pt was alert on rounds today. Reviewed case and plan with he and his wife. No sign of hematuria.   Objective: Vital signs in last 24 hours: Temp:  [98.2 F (36.8 C)-99 F (37.2 C)] 98.4 F (36.9 C) (11/12 0628) Pulse Rate:  [97-111] 107 (11/12 0748) Resp:  [18-20] 20 (11/12 0628) BP: (93-114)/(53-64) 107/64 (11/12 0748) SpO2:  [89 %-97 %] 97 % (11/12 0748)  Assessment/Plan: #hx muscle invasive bladder cancer TURBT 5/24-6 treatments of BCG Has not been able to follow-up for surveillance cystoscopy due to multiple unrelated hospitalizations. Bladder mass on CT/AP. Scheduled for outpatient cystoscopy.  # Hematuria Cleared as of the date of consult-10/31/2023.   String of bottles with no hematuria  Urology will sign off at this time. Pt will need to follow up for cystoscopy once he discharges. Please call with questions.   Intake/Output from previous day: 11/11 0701 - 11/12 0700 In: -  Out: 760 [Urine:760]  Intake/Output this shift: No intake/output data recorded.  Physical Exam:  General: sleeping CV: No cyanosis Lungs: equal chest rise  Lab Results: Recent Labs    10/31/23 1835 11/01/23 0527 11/02/23 0506  HGB 9.5* 9.6* 9.3*  HCT 30.4* 29.7* 29.5*   BMET Recent Labs    11/01/23 0527 11/02/23 0506  NA 133* 135  K 3.5 3.6  CL 99 100  CO2 24 25  GLUCOSE 128* 116*  BUN 15 16  CREATININE 1.11 1.08  CALCIUM 9.9 10.1     Studies/Results: No results found.    LOS: 3 days   Elmon Kirschner, NP [facility] Pager: [phone]  11/02/2023, 8:34 AM

## 2023-11-02 NOTE — Progress Notes (Signed)
SLP Cancellation Note  Patient Details Name: [name] MRN: [id number] DOB: 30-Nov-1934   Cancelled treatment:       Reason Eval/Treat Not Completed: Fatigue/lethargy limiting ability to participate. Per RN, patient received pain meds approximately 1 hour ago causing drowsiness.   Ferdinand Lango MA, CCC-SLP    [name] Meryl 11/02/2023, 9:27 AM

## 2023-11-02 NOTE — Progress Notes (Signed)
PT Cancellation Note  Patient Details Name: DAKKOTA SPICKARD MRN: [id number] DOB: April 13, 1934   Cancelled Treatment:    Reason Eval/Treat Not Completed: Fatigue/lethargy limiting ability to participate (pt sleeping soundly, did not arouse to verbal nor tactile stimuli. Spouse requested pt be allowed to rest. Will follow.)  Tamala Ser PT 11/02/2023  Acute Rehabilitation Services  Office [phone]

## 2023-11-02 NOTE — Progress Notes (Signed)
PROGRESS NOTE    [name]  NWG:[id number] DOB: 05-Dec-1934 DOA: 10/28/2023 PCP: [name], MD   Chief Complaint  Patient presents with   Hypotension    Brief Narrative:  Patient 89 year old gentleman history of prostate cancer status post radiation, CKD stage II-3A, renal cell carcinoma status post nephrectomy, bladder cancer, hypertension, hyperlipidemia, CAD status post CABG, type 2 diabetes, chronic HFrEF, history of TAVR, chronic back pain on chronic opiates, gout, GERD recently admitted 09/23/2023-09/30/2023 for surgical wound infection of the back with cellulitis and UTI.  Patient also noted to be hypotensive during that hospitalization and treated with midodrine which was discontinued prior to discharge.  Patient presented to the ED with transient hypotension which responded to IV fluids, dysuria, generalized weakness x 1 week.  Family also with concerns for confusion over the past few days and near syncopal episode.  Patient noted to have had decreased oral intake.  Patient seen in the ED, placed on IV fluids, COVID-19 PCR, influenza AMB PCR, RSV PCR negative.  Blood cultures obtained.  Urinalysis also obtained as well as urine cultures.  Patient also noted to be in acute kidney injury.  Head CT negative.  Patient given Percocet, vancomycin, cefepime, metronidazole in the ED and patient admitted for further evaluation and management.   Assessment & Plan:   Principal Problem:   Acute renal failure superimposed on stage 3a chronic kidney disease (HCC) Active Problems:   Essential hypertension   Diabetes mellitus (HCC)   Transient hypotension   Acute metabolic encephalopathy   Near syncope   Physical deconditioning   Hematuria   AMS (altered mental status)  #1 AKI on CKD stage II-IIIa -Blood secondary to a prerenal azotemia in the setting of poor oral intake, dehydration and diuretics. -Creatinine on admission noted at 1.5 with baseline 1.0-1.2. -Urinalysis done with trace  leukocytes, nitrite negative, 11-20 WBCs. -Urine cultures obtained. -Patient was on IV fluids. -Renal function slowly trending down creatinine at 1.08. -Continue to hold torsemide. -Patient with cough on 10/30/2023 and due to concerns for volume overload patient given Lasix 20 mg IV x 1 with urine output of 1.5 L.  -Patient also transfused a unit of PRBCs and given Lasix 20 mg IV x 1 posttransfusion on 10/31/2023 with a urine output of 1.650 L. -Follow.  2.  Hematuria/abnormal CT renal stone protocol -Per RN patient with hematuria early on during the hospitalization which seems to be clearing up.  -Patient with complaints of back pain however also does have chronic back pain. -CT renal stone protocol with asymmetric wall thickening of the inferior right bladder worrisome for neoplasm.  Recommend cystoscopy.  Small amount of layering hyperdensity in the bladder worrisome for blood products.  Nonobstructing right renal calculi.  Left renal atrophy with adjacent presumed scarring.  Bilateral renal cysts.  Colonic diverticulosis.  Trace bilateral pleural effusions.   -Patient with prior history of bladder cancer, renal cell carcinoma status post nephrectomy, now with some hematuria and abnormal CT scan concerning for recurrent bladder cancer.   -Patient seen in consultation by urology who are recommending outpatient follow-up for cystoscopy to evaluate bladder mass.  -Supportive care.  3.  Acute metabolic encephalopathy -Per family patient noted to have some confusion. -Likely multifactorial secondary to dehydration in the setting of chronic opioid use for back pain. -Patient received IV antibiotics in the ED, no obvious infection noted at this time as patient with no fever, no leukocytosis, no meningeal signs. -Patient with no respiratory symptoms, chest x-ray negative  for any acute infiltrates. -Urinalysis not strongly suggestive of UTI with urine cultures pending. -Patient with no signs of  infection of lower back surgical wound site at this time. -CT head negative for any acute abnormalities. -Patient with recent labs a month ago with TSH, vitamin B12, B1 levels within normal limits. -Ammonia levels within normal limits. -Initial blood cultures drawn on admission with MRSE likely contaminant. -Repeat blood cultures x 2 pending with no growth to date.. -Patient improving clinically but slowly. -Patient with some bouts of hypotension could likely be contributing to patient's encephalopathy in the setting of narcotic use. -Patient with slow responses to questions today... -Minimize narcotics. -Decrease oxycodone to 2.5 mg p.o. every 6 hours as needed. -If no significant improvement may need to consider MRI of the brain. -Supportive care.  4.  Transient hypotension -Patient presentation noted to have a transient hypotension which responded to IV fluids. -Diuretics of torsemide on hold. -BP fluctuating noted to have been on midodrine before in the past. -Concern for volume overload and as such saline lock IV fluids. -BP borderline, patient given Lasix 20 mg IV x 1 on 10/30/2023 and as such patient started on midodrine 5 mg 3 times daily. -Due to soft blood pressure/hypotensive bouts midodrine was increased to 10 mg 3 times daily. -Patient with hypotension this morning with systolics in the 80s, arousable, noted to have received some oxycodone prior to this this morning. -Decrease oxycodone to 2.5 mg p.o. every 6 hours as needed. -Continue midodrine 10 mg 3 times daily. -Check a.m. cortisol level. -IV albumin every 6 hours x 24 hours.  5.  Near syncope -As noted per admitting physician (ED report family reported near syncopal episode the day prior to admission likely secondary to dehydration and hypotension. -CT head with no acute abnormalities. -Patient with no acute focal neurological deficits. -Patient not hypoxic with no shortness of breath and low probability for  PE. -Clinical improvement with hydration.  6.  Generalized weakness/physical deconditioning -PT/OT.  7.  Hypertension>> hypotension -Patient bout of hypotension with systolic blood pressures in the 80s.   -Continue to hold Toprol-XL, torsemide.   8.  Diabetes mellitus type 2 -Hemoglobin A1c 6.4 (10/28/2023) -CBG 118 this morning. -Continue to hold oral hypoglycemic agents. -SSI.  9.  Chronic back pain -Patient noted to be on chronic opioids for a while for his chronic back pain. -Due to concerns opioid may be contributing to patient's altered mental status on presentation and as such patient placed on oxycodone 5 mg every 4 hours as needed pain.   -Continue scheduled Tylenol 1000 mg 3 times daily.   -Patient noted to have a bout of hypotension with systolics in the 80s noted to have received some oxycodone early on this morning. -Decrease oxycodone to 2.5 mg p.o. every 6 hours as needed pain. -Follow  10.  Chronic HFrEF -2D echo from 08/05/2023 with a EF of 30%, grade 1 diastolic dysfunction, right ventricular systolic function moderately reduced, mild MVR, trivial AVR.  Patient with no signs of significant volume overload on exam however patient with coughing on 11/8-11/9 and felt likely secondary to volume overload.. -Diuretics held on admission. -Status post Lasix 20 mg IV x 1 10/30/2023 and Lasix 20 mg IV x 1 on 10/31/2023, with a urine output of 60 cc over the past 24 hours. -Saline lock IV fluids.  11.  Normocytic anemia -Patient noted with some hematuria per RN. -Hemoglobin currently at 9.3. -Anemia panel with iron of 32, TIBC of 237, ferritin  of 429, folate of 20.4, vitamin B12 of 385. -Status post transfusion 1 unit PRBCs.  -Follow H&H.  12.  GERD -Pepcid.  13.  CAD status post CABG -Stable. -Continue to hold home regimen aspirin in light of concern for hematuria. -Due to transient hypotension on admission continue to hold Toprol-XL and Demadex.    DVT  prophylaxis: Heparin>>>> SCDs Code Status: Full Family Communication: Updated patient, wife, nephew at bedside. Disposition:   Status is: Inpatient    Consultants:  Urology: Dr. Ronne Binning 10/31/2023  Procedures:  CT head 10/28/2023 Chest x-ray 10/28/2023 CT renal stone protocol 10/30/2023 Transfuse 1 unit PRBCs 10/31/2023  Antimicrobials:  Anti-infectives (From admission, onward)    Start     Dose/Rate Route Frequency Ordered Stop   10/28/23 1300  vancomycin (VANCOREADY) IVPB 1500 mg/300 mL        1,500 mg 150 mL/hr over 120 Minutes Intravenous  Once 10/28/23 1240 10/28/23 1900   10/28/23 1245  ceFEPIme (MAXIPIME) 2 g in sodium chloride 0.9 % 100 mL IVPB        2 g 200 mL/hr over 30 Minutes Intravenous  Once 10/28/23 1236 10/28/23 1513   10/28/23 1245  metroNIDAZOLE (FLAGYL) IVPB 500 mg        500 mg 100 mL/hr over 60 Minutes Intravenous  Once 10/28/23 1236 10/28/23 1406   10/28/23 1245  vancomycin (VANCOCIN) IVPB 1000 mg/200 mL premix  Status:  Discontinued        1,000 mg 200 mL/hr over 60 Minutes Intravenous  Once 10/28/23 1236 10/28/23 1239         Subjective: Patient sleeping.  Nonresponsive to verbal stimuli however arouses with noxious stimuli.  Alert and oriented to self and place and time.  Denies any chest pain or shortness of breath.  No abdominal pain.  Urine seems to have cleared up.  Nephew and wife at bedside.  Per RN systolic blood pressures in the 80s.  Patient also noted to have received some pain medications earlier on this morning.  Objective: Vitals:   11/02/23 0309 11/02/23 0628 11/02/23 0748 11/02/23 1134  BP: (!) 93/55 114/60 107/64 (!) 87/45  Pulse: (!) 110 (!) 103 (!) 107 88  Resp: 18 20  18   Temp: 98.7 F (37.1 C) 98.4 F (36.9 C)  98 F (36.7 C)  TempSrc: Oral Oral    SpO2: 95% 97% 97% 95%  Weight:      Height:        Intake/Output Summary (Last 24 hours) at 11/02/2023 1210 Last data filed at 11/02/2023 0548 Gross per 24 hour  Intake  --  Output 760 ml  Net -760 ml   Filed Weights   10/28/23 1247  Weight: 74 kg    Examination:  General exam: NAD. Respiratory system: CTAB.  No wheezes, no crackles, no rhonchi.  Fair air movement.  Speaking in full sentences.  Cardiovascular system: RRR no murmurs rubs or gallops.  No JVD.  No lower extremity edema.  Gastrointestinal system: Abdomen is soft, nontender, nondistended, positive bowel sounds.  No rebound.  No guarding.  Central nervous system: Alert and oriented.  Moving extremities spontaneously.  No focal neurological deficits.   Extremities: Symmetric 5 x 5 power. Skin: No rashes, lesions or ulcers Psychiatry: Judgement and insight appear fair. Mood & affect appropriate.     Data Reviewed: I have personally reviewed following labs and imaging studies  CBC: Recent Labs  Lab 10/28/23 1227 10/29/23 0604 10/30/23 0708 10/31/23 0636 10/31/23 1835 11/01/23 1610  11/02/23 0506  WBC 10.0 9.1 9.7 10.9*  --  11.3* 10.0  NEUTROABS 6.6  --  6.4 7.3  --   --  6.1  HGB 8.4* 8.1* 7.8* 7.9* 9.5* 9.6* 9.3*  HCT 27.3* 26.6* 25.0* 26.0* 30.4* 29.7* 29.5*  MCV 98.2 98.9 97.7 98.1  --  94.9 97.0  PLT 328 320 309 314  --  322 325    Basic Metabolic Panel: Recent Labs  Lab 10/29/23 0604 10/30/23 0708 10/31/23 0636 11/01/23 0527 11/02/23 0506  NA 138 141 135 133* 135  K 3.6 4.0 3.7 3.5 3.6  CL 104 109 103 99 100  CO2 25 23 24 24 25   GLUCOSE 125* 138* 122* 128* 116*  BUN 28* 18 15 15 16   CREATININE 1.38* 1.16 1.08 1.11 1.08  CALCIUM 10.6* 10.5* 10.2 9.9 10.1  MG 2.0  --   --   --   --   PHOS  --   --   --   --  2.3*    GFR: Estimated Creatinine Clearance: 43.5 mL/min (by C-G formula based on SCr of 1.08 mg/dL).  Liver Function Tests: Recent Labs  Lab 10/28/23 1227 11/02/23 0506  AST 16  --   ALT 8  --   ALKPHOS 49  --   BILITOT 0.4  --   PROT 6.6  --   ALBUMIN 2.4* 2.2*    CBG: Recent Labs  Lab 11/01/23 1147 11/01/23 1732 11/01/23 2119  11/02/23 0734 11/02/23 1130  GLUCAP 168* 117* 134* 118* 161*     Recent Results (from the past 240 hour(s))  Resp panel by RT-PCR (RSV, Flu A&B, Covid) Anterior Nasal Swab     Status: None   Collection Time: 10/28/23 12:45 PM   Specimen: Anterior Nasal Swab  Result Value Ref Range Status   SARS Coronavirus 2 by RT PCR NEGATIVE NEGATIVE Final    Comment: (NOTE) SARS-CoV-2 target nucleic acids are NOT DETECTED.  The SARS-CoV-2 RNA is generally detectable in upper respiratory specimens during the acute phase of infection. The lowest concentration of SARS-CoV-2 viral copies this assay can detect is 138 copies/mL. A negative result does not preclude SARS-Cov-2 infection and should not be used as the sole basis for treatment or other patient management decisions. A negative result may occur with  improper specimen collection/handling, submission of specimen other than nasopharyngeal swab, presence of viral mutation(s) within the areas targeted by this assay, and inadequate number of viral copies(<138 copies/mL). A negative result must be combined with clinical observations, patient history, and epidemiological information. The expected result is Negative.  Fact Sheet for Patients:  BloggerCourse.com  Fact Sheet for Healthcare Providers:  SeriousBroker.it  This test is no t yet approved or cleared by the [place] FDA and  has been authorized for detection and/or diagnosis of SARS-CoV-2 by FDA under an Emergency Use Authorization (EUA). This EUA will remain  in effect (meaning this test can be used) for the duration of the COVID-19 declaration under Section 564(b)(1) of the Act, 21 U.S.C.section 360bbb-3(b)(1), unless the authorization is terminated  or revoked sooner.       Influenza A by PCR NEGATIVE NEGATIVE Final   Influenza B by PCR NEGATIVE NEGATIVE Final    Comment: (NOTE) The Xpert Xpress SARS-CoV-2/FLU/RSV plus  assay is intended as an aid in the diagnosis of influenza from Nasopharyngeal swab specimens and should not be used as a sole basis for treatment. Nasal washings and aspirates are unacceptable for Xpert Xpress SARS-CoV-2/FLU/RSV  testing.  Fact Sheet for Patients: BloggerCourse.com  Fact Sheet for Healthcare Providers: SeriousBroker.it  This test is not yet approved or cleared by the [place] FDA and has been authorized for detection and/or diagnosis of SARS-CoV-2 by FDA under an Emergency Use Authorization (EUA). This EUA will remain in effect (meaning this test can be used) for the duration of the COVID-19 declaration under Section 564(b)(1) of the Act, 21 U.S.C. section 360bbb-3(b)(1), unless the authorization is terminated or revoked.     Resp Syncytial Virus by PCR NEGATIVE NEGATIVE Final    Comment: (NOTE) Fact Sheet for Patients: BloggerCourse.com  Fact Sheet for Healthcare Providers: SeriousBroker.it  This test is not yet approved or cleared by the [place] FDA and has been authorized for detection and/or diagnosis of SARS-CoV-2 by FDA under an Emergency Use Authorization (EUA). This EUA will remain in effect (meaning this test can be used) for the duration of the COVID-19 declaration under Section 564(b)(1) of the Act, 21 U.S.C. section 360bbb-3(b)(1), unless the authorization is terminated or revoked.  Performed at [facility], [address], [place], [place] [address]   Blood Culture (routine x 2)     Status: Abnormal   Collection Time: 10/28/23 12:50 PM   Specimen: BLOOD  Result Value Ref Range Status   Specimen Description   Final    BLOOD SITE NOT SPECIFIED Performed at [facility], [address] [place], [place] [address]    Special Requests   Final    BOTTLES DRAWN AEROBIC AND ANAEROBIC Blood Culture  results may not be optimal due to an inadequate volume of blood received in culture bottles Performed at [facility], [address] [place], [place] [address]    Culture  Setup Time   Final    GRAM POSITIVE COCCI BOTTLES DRAWN AEROBIC ONLY CRITICAL RESULT CALLED TO, READ BACK BY AND VERIFIED WITH: M LILLISTON,[email or web address]@[email or web address]  10/30/23 MK    Culture (A)  Final    STAPHYLOCOCCUS EPIDERMIDIS THE SIGNIFICANCE OF ISOLATING THIS ORGANISM FROM A SINGLE SET OF BLOOD CULTURES WHEN MULTIPLE SETS ARE DRAWN IS UNCERTAIN. PLEASE NOTIFY THE MICROBIOLOGY DEPARTMENT WITHIN ONE WEEK IF SPECIATION AND SENSITIVITIES ARE REQUIRED. Performed at [facility], [address] [place], [place] [address]    Report Status 11/01/2023 FINAL  Final  Blood Culture ID Panel (Reflexed)     Status: Abnormal   Collection Time: 10/28/23 12:50 PM  Result Value Ref Range Status   Enterococcus faecalis NOT DETECTED NOT DETECTED Final   Enterococcus Faecium NOT DETECTED NOT DETECTED Final   Listeria monocytogenes NOT DETECTED NOT DETECTED Final   Staphylococcus species DETECTED (A) NOT DETECTED Final    Comment: CRITICAL RESULT CALLED TO, READ BACK BY AND VERIFIED WITH: M LILLISTON,[email or web address]@[email or web address]  10/30/23 MK    Staphylococcus aureus (BCID) NOT DETECTED NOT DETECTED Final   Staphylococcus epidermidis DETECTED (A) NOT DETECTED Final    Comment: Methicillin (oxacillin) resistant coagulase negative staphylococcus. Possible blood culture contaminant (unless isolated from more than one blood culture draw or clinical case suggests pathogenicity). No antibiotic treatment is indicated for blood  culture contaminants. CRITICAL RESULT CALLED TO, READ BACK BY AND VERIFIED WITH: M LILLISTON,PHARMD@[email or web address]  10/30/23 MK    Staphylococcus lugdunensis NOT DETECTED NOT DETECTED Final   Streptococcus species NOT DETECTED NOT DETECTED Final   Streptococcus agalactiae NOT DETECTED NOT DETECTED Final   Streptococcus pneumoniae  NOT DETECTED NOT DETECTED Final   Streptococcus pyogenes NOT DETECTED NOT DETECTED Final  A.calcoaceticus-baumannii NOT DETECTED NOT DETECTED Final   Bacteroides fragilis NOT DETECTED NOT DETECTED Final   Enterobacterales NOT DETECTED NOT DETECTED Final   Enterobacter cloacae complex NOT DETECTED NOT DETECTED Final   Escherichia coli NOT DETECTED NOT DETECTED Final   Klebsiella aerogenes NOT DETECTED NOT DETECTED Final   Klebsiella oxytoca NOT DETECTED NOT DETECTED Final   Klebsiella pneumoniae NOT DETECTED NOT DETECTED Final   Proteus species NOT DETECTED NOT DETECTED Final   Salmonella species NOT DETECTED NOT DETECTED Final   Serratia marcescens NOT DETECTED NOT DETECTED Final   Haemophilus influenzae NOT DETECTED NOT DETECTED Final   Neisseria meningitidis NOT DETECTED NOT DETECTED Final   Pseudomonas aeruginosa NOT DETECTED NOT DETECTED Final   Stenotrophomonas maltophilia NOT DETECTED NOT DETECTED Final   Candida albicans NOT DETECTED NOT DETECTED Final   Candida auris NOT DETECTED NOT DETECTED Final   Candida glabrata NOT DETECTED NOT DETECTED Final   Candida krusei NOT DETECTED NOT DETECTED Final   Candida parapsilosis NOT DETECTED NOT DETECTED Final   Candida tropicalis NOT DETECTED NOT DETECTED Final   Cryptococcus neoformans/gattii NOT DETECTED NOT DETECTED Final   Methicillin resistance mecA/C DETECTED (A) NOT DETECTED Final    Comment: CRITICAL RESULT CALLED TO, READ BACK BY AND VERIFIED WITH: M LILLISTON,PHARMD@[email or web address]  10/30/23 MK Performed at [facility], [address] [place], [place] [address]   Blood Culture (routine x 2)     Status: None   Collection Time: 10/28/23  1:00 PM   Specimen: BLOOD  Result Value Ref Range Status   Specimen Description   Final    BLOOD SITE NOT SPECIFIED Performed at [facility], [address], [place], [place] [address]    Special Requests   Final    BOTTLES DRAWN AEROBIC AND ANAEROBIC Blood Culture  adequate volume Performed at [facility], [address] [place], [place] [address]    Culture   Final    NO GROWTH 5 DAYS Performed at [facility], [address], [place], [place] [address]    Report Status 11/02/2023 FINAL  Final  Urine Culture     Status: Abnormal   Collection Time: 10/28/23  2:39 PM   Specimen: Urine, Random  Result Value Ref Range Status   Specimen Description   Final    URINE, RANDOM Performed at [facility], [address], [place], [place] [address]    Special Requests   Final    NONE Reflexed from [id number] Performed at [facility], [address] [place], [place] [address]    Culture (A)  Final    <10,000 COLONIES/mL INSIGNIFICANT GROWTH Performed at [facility], [address], [place], [place] [address]    Report Status 10/29/2023 FINAL  Final  Urine Culture (for pregnant, neutropenic or urologic patients or patients with an indwelling urinary catheter)     Status: None   Collection Time: 10/29/23 12:03 PM   Specimen: Urine, Clean Catch  Result Value Ref Range Status   Specimen Description   Final    URINE, CLEAN CATCH Performed at [facility], [address] [place], [place] [address]    Special Requests   Final    NONE Performed at [facility], [address] [place], [place] [address]    Culture   Final    NO GROWTH Performed at [facility], [address] [place], [place] [address]  Report Status 10/30/2023 FINAL  Final  Culture, blood (Routine X 2) w Reflex to ID Panel     Status: None (Preliminary result)   Collection Time: 10/30/23 12:16 PM   Specimen: BLOOD  Result Value Ref Range Status   Specimen Description   Final    BLOOD BLOOD RIGHT ARM Performed at [facility], [address] [place], [place] [address]    Special Requests   Final    BOTTLES DRAWN AEROBIC AND ANAEROBIC Blood  Culture adequate volume Performed at [facility], [address] [place], [place] [address]    Culture   Final    NO GROWTH 3 DAYS Performed at [facility], [address], [place], [place] [address]    Report Status PENDING  Incomplete  Culture, blood (Routine X 2) w Reflex to ID Panel     Status: None (Preliminary result)   Collection Time: 10/30/23 12:19 PM   Specimen: BLOOD  Result Value Ref Range Status   Specimen Description   Final    BLOOD BLOOD LEFT HAND Performed at [facility], [address] [place], [place] [address]    Special Requests   Final    BOTTLES DRAWN AEROBIC AND ANAEROBIC Blood Culture adequate volume Performed at [facility], [address], [place], [place] [address]    Culture   Final    NO GROWTH 3 DAYS Performed at [facility], [address] [place], [place] [address]    Report Status PENDING  Incomplete         Radiology Studies: No results found.      Scheduled Meds:  acetaminophen  1,000 mg Oral TID   cholecalciferol  5,000 Units Oral Daily   DULoxetine  20 mg Oral BID   famotidine  40 mg Oral Daily   insulin aspart  0-5 Units Subcutaneous QHS   insulin aspart  0-9 Units Subcutaneous TID WC   lidocaine  15 mL Oral Once   midodrine  10 mg Oral TID WC   multivitamin with minerals  1 tablet Oral QHS   mouth rinse  15 mL Mouth Rinse 4 times per day   phosphorus  250 mg Oral BID   polyvinyl alcohol  1 drop Left Eye BID   Ensure Max Protein  11 oz Oral Daily   sodium chloride flush  10 mL Intravenous Q12H   tamsulosin  0.4 mg Oral BID   Continuous Infusions:  albumin human        LOS: 3 days    Time spent: 40 minutes    Ramiro Harvest, MD [facility]   To contact the attending provider between 7A-7P or the covering provider during after hours 7P-7A, please log into the web site [email or web address] and access using universal [place]  password for that web site. If you do not have the password, please call the hospital operator.  11/02/2023, 12:10 PM

## 2023-11-03 DIAGNOSIS — N179 Acute kidney failure, unspecified: Secondary | ICD-10-CM | POA: Diagnosis not present

## 2023-11-03 DIAGNOSIS — R4182 Altered mental status, unspecified: Secondary | ICD-10-CM

## 2023-11-03 DIAGNOSIS — G9341 Metabolic encephalopathy: Secondary | ICD-10-CM | POA: Diagnosis not present

## 2023-11-03 DIAGNOSIS — E1159 Type 2 diabetes mellitus with other circulatory complications: Secondary | ICD-10-CM | POA: Diagnosis not present

## 2023-11-03 LAB — BASIC METABOLIC PANEL
Anion gap: 8 (ref 5–15)
BUN: 14 mg/dL (ref 8–23)
CO2: 26 mmol/L (ref 22–32)
Calcium: 10.2 mg/dL (ref 8.9–10.3)
Chloride: 100 mmol/L (ref 98–111)
Creatinine, Ser: 1.08 mg/dL (ref 0.61–1.24)
GFR, Estimated: >60 mL/min (ref >60)
Glucose, Bld: 114 mg/dL — ABNORMAL HIGH (ref 70–99)
Potassium: 3.8 mmol/L (ref 3.5–5.1)
Sodium: 134 mmol/L — ABNORMAL LOW (ref 135–145)

## 2023-11-03 LAB — CBC
HCT: 26.6 % — ABNORMAL LOW (ref 39.0–52.0)
Hemoglobin: 8.2 g/dL — ABNORMAL LOW (ref 13.0–17.0)
MCH: 29.8 pg (ref 26.0–34.0)
MCHC: 30.8 g/dL (ref 30.0–36.0)
MCV: 96.7 fL (ref 80.0–100.0)
Platelets: 287 10*3/uL (ref 150–400)
RBC: 2.75 MIL/uL — ABNORMAL LOW (ref 4.22–5.81)
RDW: 14.8 % (ref 11.5–15.5)
WBC: 8.6 10*3/uL (ref 4.0–10.5)
nRBC: 0 % (ref 0.0–0.2)

## 2023-11-03 LAB — GLUCOSE, CAPILLARY
Glucose-Capillary: 124 mg/dL — ABNORMAL HIGH (ref 70–99)
Glucose-Capillary: 149 mg/dL — ABNORMAL HIGH (ref 70–99)
Glucose-Capillary: 132 mg/dL — ABNORMAL HIGH (ref 70–99)
Glucose-Capillary: 113 mg/dL — ABNORMAL HIGH (ref 70–99)

## 2023-11-03 LAB — CORTISOL: Cortisol, Plasma: 8.4 ug/dL

## 2023-11-03 MED ORDER — LIDOCAINE 5 % EX PTCH
1.0000 | MEDICATED_PATCH | Freq: Every day | CUTANEOUS | Status: DC | PRN
  Administered 2023-11-03: 1 via TRANSDERMAL
  Filled 2023-11-03 (×2): qty 1

## 2023-11-03 NOTE — Progress Notes (Signed)
Occupational Therapy Treatment Patient Details Name: [name] MRN: [id number] DOB: November 16, 1934 Today's Date: 11/03/2023   History of present illness 89 y.o. male admitted with acute metabolic encephalopathy, AKI, transient hypotension and near syncope event. PMH: prostate cancer status post radiation, CKD stage II-IIIa, renal cell carcinoma status post nephrectomy, bladder cancer, hypertension, hyperlipidemia, CAD status post CABG, type 2 diabetes, chronic HFrEF, history of TAVR, chronic back pain on opiate, gout, GERD.  Admittion 10/3-10/10 for surgical wound infection of his back with cellulitis and also UTI.   OT comments  Pt making limited progress towards OT goals this session.  Pt with waxing and wain ing alertness. Initially requiring log rolling in bed with RN for skin check assessment. Pt required dressing change on back - PT able to roll aty min A with vc for back precautions. Max A for LB bathing/lotion application. Pt mod A +2 for sit<>stand from OB and min A +2 for step pivot to recliner (built up with pillows) Pt wife present throughout and supportive/encouraging. Pt will benefit from continued acute OT and afterwards at <3 hour daily rehab. Continue to focus on OOB activity while monitoring BP and engaging in meaningful ADL.       If plan is discharge home, recommend the following:  A lot of help with bathing/dressing/bathroom;A little help with walking and/or transfers;Assist for transportation;Help with stairs or ramp for entrance   Equipment Recommendations  None recommended by OT    Recommendations for Other Services PT consult;Speech consult    Precautions / Restrictions Precautions Precautions: Fall Precaution Comments: back surgery 08/2023 at [facility] Required Braces or Orthoses:  (none) Restrictions Weight Bearing Restrictions: No       Mobility Bed Mobility Overal bed mobility: Needs Assistance Bed Mobility: Sidelying to Sit, Rolling   Sidelying to sit: Min  assist       General bed mobility comments: increased time and cues for sequencing    Transfers Overall transfer level: Needs assistance Equipment used: 2 person hand held assist Transfers: Sit to/from Stand, Bed to chair/wheelchair/BSC Sit to Stand: Mod assist, +2 safety/equipment     Step pivot transfers: Min assist, +2 safety/equipment, +2 physical assistance     General transfer comment: Increased time, step pivot to chair with assist for balance and weight shift     Balance Overall balance assessment: Needs assistance Sitting-balance support: Feet supported, No upper extremity supported Sitting balance-Leahy Scale: Fair Sitting balance - Comments: CGA   Standing balance support: Bilateral upper extremity supported Standing balance-Leahy Scale: Poor Standing balance comment: Needs RW and min A at minimum                           ADL either performed or assessed with clinical judgement   ADL Overall ADL's : Needs assistance/impaired     Grooming: Set up;Oral care;Wash/dry face;Moderate assistance;Sitting Grooming Details (indicate cue type and reason): Pt was abe to wash face. OT assisted with oral care by cleaning top denture at sink including putting in pink container to soak with denture tablet - fix-o-dent in room     Lower Body Bathing: Maximal assistance;Bed level Lower Body Bathing Details (indicate cue type and reason): washed and put lotion on feet. Pt was minimally able to lift legs to assist Upper Body Dressing : Moderate assistance   Lower Body Dressing: Maximal assistance;Bed level Lower Body Dressing Details (indicate cue type and reason): donning socks Toilet Transfer: Stand-pivot;Cueing for safety;BSC/3in1;Moderate assistance;+2 for safety/equipment;+2  for physical assistance (face to face) Toilet Transfer Details (indicate cue type and reason): increased lethargy requiring increased assist to come to stand Toileting- Clothing Manipulation  and Hygiene: Moderate assistance;+2 for safety/equipment;Sit to/from stand       Functional mobility during ADLs: Cueing for sequencing;Cueing for safety;Rolling walker (2 wheels);Moderate assistance;+2 for safety/equipment General ADL Comments: wife present, Pt varied on alertness    Extremity/Trunk Assessment Upper Extremity Assessment Upper Extremity Assessment: Generalized weakness   Lower Extremity Assessment Lower Extremity Assessment: Defer to PT evaluation        Vision   Additional Comments: glasses at baseline   Perception     Praxis      Cognition Arousal: Alert, Lethargic (back and forth) Behavior During Therapy: Flat affect Overall Cognitive Status: Impaired/Different from baseline Area of Impairment: Orientation, Attention, Memory, Safety/judgement, Awareness                 Orientation Level: Disoriented to, Time Current Attention Level: Sustained Memory: Decreased short-term memory   Safety/Judgement: Decreased awareness of safety Awareness: Emergent   General Comments: Slow to respond, followed approx 50% of commands - up to 90% with increased time if Pt is alert        Exercises      Shoulder Instructions       General Comments Wife present and supportive    Pertinent Vitals/ Pain       Pain Assessment Pain Assessment: Faces Faces Pain Scale: Hurts little more Pain Location: back Pain Descriptors / Indicators: Aching, Dull, Grimacing Pain Intervention(s): Limited activity within patient's tolerance, Monitored during session, Repositioned  Home Living                                          Prior Functioning/Environment              Frequency  Min 1X/week        Progress Toward Goals  OT Goals(current goals can now be found in the care plan section)  Progress towards OT goals: Progressing toward goals  Acute Rehab OT Goals Patient Stated Goal: get on my feet again OT Goal Formulation: With  patient/family Time For Goal Achievement: 11/12/23 Potential to Achieve Goals: Good ADL Goals Pt Will Perform Lower Body Bathing: with min assist;sit to/from stand Pt Will Perform Lower Body Dressing: with min assist;sit to/from stand Pt Will Transfer to Toilet: with contact guard assist;ambulating Pt Will Perform Toileting - Clothing Manipulation and hygiene: with contact guard assist;sit to/from stand Additional ADL Goal #1: Pt will demonstrate selective attention in 3 step ADL task in moderately distracting environment  Plan      Co-evaluation                 AM-PAC OT "6 Clicks" Daily Activity     Outcome Measure   Help from another person eating meals?: None Help from another person taking care of personal grooming?: A Little Help from another person toileting, which includes using toliet, bedpan, or urinal?: A Lot Help from another person bathing (including washing, rinsing, drying)?: A Lot Help from another person to put on and taking off regular upper body clothing?: A Little Help from another person to put on and taking off regular lower body clothing?: A Lot 6 Click Score: 16    End of Session Equipment Utilized During Treatment: Gait belt  OT Visit Diagnosis: Unsteadiness on  feet (R26.81);Other abnormalities of gait and mobility (R26.89);Muscle weakness (generalized) (M62.81);Pain Pain - Right/Left:  (central) Pain - part of body:  (back)   Activity Tolerance Patient tolerated treatment well   Patient Left in chair;with call bell/phone within reach;with chair alarm set;with family/visitor present   Nurse Communication Mobility status        Time: 1058-1140 OT Time Calculation (min): 42 min  Charges: OT General Charges $OT Visit: 1 Visit OT Treatments $Self Care/Home Management : 23-37 mins $Therapeutic Activity: 8-22 mins  Nyoka Cowden OTR/L Acute Rehabilitation Services Office: [phone]  Evern Bio [facility] 11/03/2023, 1:23 PM

## 2023-11-03 NOTE — Hospital Course (Addendum)
The Patient 89 year old gentleman history of prostate cancer status post radiation, CKD stage II-3A, renal cell carcinoma status post nephrectomy, bladder cancer, hypertension, hyperlipidemia, CAD status post CABG, type 2 diabetes, chronic HFrEF, history of TAVR, chronic back pain on chronic opiates, gout, GERD recently admitted 09/23/2023-09/30/2023 for surgical wound infection of the back with cellulitis and UTI. Patient also noted to be hypotensive during that hospitalization and treated with midodrine which was discontinued prior to discharge.   Patient presented to the ED with transient hypotension which responded to IV fluids, dysuria, generalized weakness x 1 week. Family also with concerns for confusion over the past few days and near syncopal episode. Patient noted to have had decreased oral intake. Patient seen in the ED, placed on IV fluids, COVID-19 PCR, influenza AMB PCR, RSV PCR negative. Blood cultures obtained. Urinalysis also obtained as well as urine cultures. Patient also noted to be in acute kidney injury. Head CT negative. Patient given Percocet, vancomycin, cefepime, metronidazole in the ED and patient admitted for further evaluation and management.   **Interim History  Renal function is improved and his confusion is slowly improving.  Blood pressure control remain low and his antihypertensives and diuretics have been held and has been placed on midodrine.  PT OT recommending SNF but his participation has been very limited so he will be better as a long-term care candidate and has a bed available.  His blood pressure is stable and he is stable for discharge and will need to follow-up with PCP, palliative care and neurology in outpatient setting  ADDENDUM 11/05/23: The plan was to D/C the patient yesterday to LTC but prior to D/C he vomited. There was concern for Aspiration so he has been started on Abx and given a Flutter Valve and Incentive spirometry. Palliative Care was also consulted  for GOC discussion but will continue current plan of care and have further Palliative Discussions in the outpatient setting. He is medically stable for D/C and medication changes have been made and he will need to follow up with the above specialists noted.   Assessment and Plan:  AKI on CKD stage II-IIIa, improved significantly and stable -Blood secondary to a prerenal azotemia in the setting of poor oral intake, dehydration and diuretics. -Creatinine on admission noted at 1.5 with baseline 1.0-1.2. -BUN/Cr Trend: Recent Labs  Lab 10/30/23 0708 10/31/23 0636 11/01/23 0527 11/02/23 0506 11/03/23 0519 11/04/23 0530 11/05/23 0526  BUN 18 15 15 16 14 15 16   CREATININE 1.16 1.08 1.11 1.08 1.08 1.08 1.06  Urinalysis done with trace leukocytes, nitrite negative, 11-20 WBCs. Urine Cx obtained  -Avoid Nephrotoxic Medications and continue to hold Torsemide, Contrast Dyes, Hypotension and Dehydration to Ensure Adequate Renal Perfusion and will need to Renally Adjust Meds -Patient with cough on 10/30/2023 and due to concerns for volume overload patient given Lasix 20 mg IV x 1 with urine output of 1.5 L.  -Patient also transfused a unit of PRBCs and given Lasix 20 mg IV x 1 posttransfusion on 10/31/2023 with a urine output of 1.650 L. -Continue to Monitor and Trend Renal Function carefully and repeat CMP within 1 week   Hematuria/Abnormal CT renal stone protocol -Per RN patient with hematuria early on during the hospitalization which seems to be clearing up.  -Patient with complaints of back pain however does have chronic back pain. -CT renal stone protocol with asymmetric wall thickening of the inferior right bladder worrisome for neoplasm.  Recommend cystoscopy.  Small amount of layering hyperdensity in the  bladder worrisome for blood products.  Nonobstructing right renal calculi.  Left renal atrophy with adjacent presumed scarring.  Bilateral renal cysts.  Colonic diverticulosis.  Trace bilateral  pleural effusions.   -Patient with prior history of bladder cancer, renal cell carcinoma status post nephrectomy, now with some hematuria and abnormal CT scan concerning for recurrent bladder cancer. -Patient seen in consultation by Urology who are recommending outpatient follow-up for cystoscopy to evaluate bladder mass.  -C/w Supportive care and follow-up with urology in outpatient setting -Palliative Care consulted for GOC discussion and will need further discussions as an outpatient    Acute Metabolic Encephalopathy, improved -Per family patient noted to have some confusion. -Likely multifactorial secondary to dehydration in the setting of chronic opioid use for back pain. -Patient received IV antibiotics in the ED, no obvious infection noted at this time as patient with no fever, no leukocytosis, no meningeal signs. -WBC Trend: Recent Labs  Lab 10/30/23 0708 10/31/23 0636 11/01/23 0527 11/02/23 0506 11/03/23 0519 11/04/23 0530 11/05/23 0526  WBC 9.7 10.9* 11.3* 10.0 8.6 9.8 8.7  -Patient with no respiratory symptoms, chest x-ray negative for any acute infiltrates. -Urinalysis not strongly suggestive of UTI with urine cultures pending. -Patient with no signs of infection of lower back surgical wound site at this time. -CT head negative for any acute abnormalities. -Patient with recent labs a month ago with TSH, vitamin B12, B1 levels within normal limits. -Ammonia levels within normal limits. -Initial blood cultures drawn on admission with MRSE likely contaminant. -Repeat blood cultures x 2 pending with no growth to date.. -Patient improving clinically but slowly. -Patient had some bouts of hypotension could likely be contributing to patient's encephalopathy in the setting of narcotic use. -Continue to minimize narcotics and decreased Oxycodone to 2.5 mg p.o. every 6 hours as needed. -If no significant improvement may need to consider MRI of the brain but is stable. -C/w  Supportive care in the outpatient setting  Suspected Aspiration PNA -CXR done and showed " Increased central vascular prominence, development of mild interstitial edema in the lung bases and small pleural effusions. Increased opacity in the lung bases which could be due to atelectasis or  pneumonic infiltrate. Low inspiration. Aortic atherosclerosis." -SpO2: 96 % -Start Doxy/Flagyl for concern for Aspiration from vomiting yesterday and continue for 7 days -Repeat CXR in 3-6 weeks   Hypotension, stable -Patient presentation noted to have a transient hypotension which responded to IV fluids. Patient bout of hypotension with systolic blood pressures in the 80s.   -BP fluctuating noted to have been on midodrine before in the past. -Concern for volume overload and as such saline lock IV fluids. -BP borderline, patient given Lasix 20 mg IV x 1 on 10/30/2023  -Patient with hypotension this morning with systolics in the 80s, arousable, noted to have received some oxycodone prior to this this morning. -Decrease oxycodone to 2.5 mg p.o. every 6 hours as needed. -Patient was given IV Albumin 25 q6h x 1 day -Currently remains on Midodrine 10 mg po TID with meals -Continue to hold Toprol-XL, Torsemide at D/C.  -Continue to Monitor BP per Protocol  -Last BP reading was stable at 105/63 -Follow up with Palliative Care in the outpatient setting   Near Syncope -As noted per admitting physician (ED report family reported near syncopal episode the day prior to admission likely secondary to dehydration and hypotension. -CT head with no acute abnormalities. -Patient with no acute focal neurological deficits. -Patient not hypoxic with no shortness of breath  and low probability for PE. -Clinical improvement with hydration. -Will place on TED Hose for D/C   Generalized Weakness/Physical Deconditioning -PT/OT recommending SNF but he is more of a LTC candiditate.    Diabetes Mellitus Type 2 -Hemoglobin A1c  6.4 (10/28/2023) -CBG Trend: Recent Labs  Lab 11/03/23 1654 11/03/23 2048 11/04/23 0730 11/04/23 1153 11/04/23 1740 11/04/23 2145 11/05/23 0742  GLUCAP 132* 113* 138* 150* 132* 100* 101*  -Continue to hold oral hypoglycemic agents of Glipizide 5 mg po BID and will not resume at D/C -C/w Sensitive Novolog SSI AC/HS   Chronic Back Pain -Patient noted to be on chronic opioids for a while for his chronic back pain. -Due to concerns opioid may be contributing to patient's altered mental status on presentation and as such patient placed on oxycodone 5 mg every 4 hours as needed pain and reduced to 2.5 mg po q6hprn .   -Continue scheduled Tylenol 1000 mg 3 times daily at facility.  -Patient noted to have a bout of hypotension with systolics in the 80s after received some oxycodone early on this morning. -Continue to Monitor and resume Lidocaine 5% Patch    Chronic HFrEF -2D echo from 08/05/2023 with a EF of 30%, grade 1 diastolic dysfunction, right ventricular systolic function moderately reduced, mild MVR, trivial AVR.  Patient with no signs of significant volume overload on exam however patient with coughing on 11/8-11/9 and felt likely secondary to volume overload.. -Diuretics held on admission but he is Status post Lasix 20 mg IV x 1 10/30/2023 and Lasix 20 mg IV x 1 on 10/31/2023 -Strict I's and O's and Daily Weights  Intake/Output Summary (Last 24 hours) at 11/05/2023 1010 Last data filed at 11/05/2023 0545 Gross per 24 hour  Intake --  Output 551 ml  Net -551 ml  -Saline lock IV fluids. -Continue to Monitor for S/Sx of Volume Overload  Hyponatremia -Na+ Trend: Recent Labs  Lab 10/30/23 0708 10/31/23 0636 11/01/23 0527 11/02/23 0506 11/03/23 0519 11/04/23 0530 11/05/23 0526  NA 141 135 133* 135 134* 132* 132*  -Continue to Monitor and Trend and repeat CMP in the AM   Normocytic Anemia -Patient noted with some hematuria per RN. -Hgb/Hct Trend: Recent Labs  Lab  10/31/23 0636 10/31/23 1835 11/01/23 0527 11/02/23 0506 11/03/23 0519 11/04/23 0530 11/05/23 0526  HGB 7.9* 9.5* 9.6* 9.3* 8.2* 9.5* 9.1*  HCT 26.0* 30.4* 29.7* 29.5* 26.6* 30.5* 29.4*  MCV 98.1  --  94.9 97.0 96.7 96.2 96.7  -Anemia panel with iron of 32, TIBC of 237, ferritin of 429, folate of 20.4, vitamin B12 of 385. -Status post transfusion 1 unit PRBCs.  -Continue to Monitor for S/Sx of Bleeding -Repeat CBC in the AM    GERD/GI Prophylaxis  -C/w Famotidine 40 mg po Daily    CAD status post CABG -Stable. -Continue to hold home regimen aspirin in light of concern for hematuria. -Due to transient hypotension on admission continue to hold Metoprolol Succinate 25 mg po Daily and Torsemide 20 mg po MWFor now. -If necessary will resume home NTG SL 0.4 mg po q58min  prn CP  Hypoalbuminemia -Patient's Albumin Trend: Recent Labs  Lab 10/28/23 1227 11/02/23 0506 11/04/23 0530 11/05/23 0526  ALBUMIN 2.4* 2.2* 3.1* 2.8*  -Continue to Monitor and Trend and repeat CMP in the AM  Back Lumbar Wound -Had Surgery at [facility]/16/ and seen at [facility] 10/1 -WOC consulted and recommending "lean back wound with NS, apply Silver Hydrofiber ([employer]qu[employer]  [employer] [place] #[id number]) cut to fit wound bed daily,  use a Q tip applicator to insert into wound bed leaving a tail of silver hanging out of wound for easy removal.   Cover with dry gauze and silicone foam. May lift foam daily to replace silver.  Change foam q3 days and prn soiling. Moisten silver with NS if stuck to wound bed for atraumatic removal." -Will need to follow up with Back Surgeon at [facility] after discharge for continued assessment and care of this incision -Palliative consulted and adjutsed medications and will need outpatient follow up.

## 2023-11-03 NOTE — Plan of Care (Signed)
  Problem: Metabolic: Goal: Ability to maintain appropriate glucose levels will improve Outcome: Progressing   Problem: Nutritional: Goal: Maintenance of adequate nutrition will improve Outcome: Progressing Goal: Progress toward achieving an optimal weight will improve Outcome: Progressing   Problem: Skin Integrity: Goal: Risk for impaired skin integrity will decrease Outcome: Progressing   Problem: Tissue Perfusion: Goal: Adequacy of tissue perfusion will improve Outcome: Progressing   Problem: Clinical Measurements: Goal: Ability to maintain clinical measurements within normal limits will improve Outcome: Progressing Goal: Will remain free from infection Outcome: Progressing Goal: Diagnostic test results will improve Outcome: Progressing Goal: Respiratory complications will improve Outcome: Progressing Goal: Cardiovascular complication will be avoided Outcome: Progressing

## 2023-11-03 NOTE — Plan of Care (Signed)
  Problem: Coping: Goal: Ability to adjust to condition or change in health will improve Outcome: Not Progressing   Problem: Fluid Volume: Goal: Ability to maintain a balanced intake and output will improve Outcome: Progressing   Problem: Pain Management: Goal: General experience of comfort will improve Outcome: Not Progressing

## 2023-11-03 NOTE — Progress Notes (Signed)
PT Cancellation Note  Patient Details Name: [name] MRN: [id number] DOB: 26-Nov-1934   Cancelled Treatment:    Reason Eval/Treat Not Completed: Fatigue/lethargy limiting ability to participate;Pain limiting ability to participate. RN reports pt engages in OT earlier and got to recliner, but pt recently transferred back to bed. Pt declines PT due to pain and fatigue, appears tired. Will continue to follow acutely.    Domenick Bookbinder PT, DPT 11/03/23, 12:57 PM

## 2023-11-03 NOTE — Progress Notes (Signed)
Chaplain received a consult that [name] wanted assistance with creating or updating advance directives.  Chaplain was unable to meet with [name] today due to short staffing in our department.  Chaplain will attempt to see [name] tomorrow, but please page if there are emergent spiritual/emotional needs.  [address] [facility] Pager, [phone]

## 2023-11-03 NOTE — Progress Notes (Signed)
PROGRESS NOTE    [name]  MWU:[id number] DOB: 12-07-1934 DOA: 10/28/2023 PCP: [name], MD   Brief Narrative:  The Patient 89 year old gentleman history of prostate cancer status post radiation, CKD stage II-3A, renal cell carcinoma status post nephrectomy, bladder cancer, hypertension, hyperlipidemia, CAD status post CABG, type 2 diabetes, chronic HFrEF, history of TAVR, chronic back pain on chronic opiates, gout, GERD recently admitted 09/23/2023-09/30/2023 for surgical wound infection of the back with cellulitis and UTI. Patient also noted to be hypotensive during that hospitalization and treated with midodrine which was discontinued prior to discharge.   Patient presented to the ED with transient hypotension which responded to IV fluids, dysuria, generalized weakness x 1 week. Family also with concerns for confusion over the past few days and near syncopal episode. Patient noted to have had decreased oral intake. Patient seen in the ED, placed on IV fluids, COVID-19 PCR, influenza AMB PCR, RSV PCR negative. Blood cultures obtained. Urinalysis also obtained as well as urine cultures. Patient also noted to be in acute kidney injury. Head CT negative. Patient given Percocet, vancomycin, cefepime, metronidazole in the ED and patient admitted for further evaluation and management.   **Interim History  Renal function is improved and his confusion is slowly improving.  Blood pressure control remain low and his antihypertensives and diuretics have been held and has been placed on midodrine.  PT OT recommending SNF.  Assessment and Plan:  AKI on CKD stage II-IIIa -Blood secondary to a prerenal azotemia in the setting of poor oral intake, dehydration and diuretics. -Creatinine on admission noted at 1.5 with baseline 1.0-1.2. -BUN/Cr Trend: Recent Labs  Lab 10/28/23 1227 10/29/23 0604 10/30/23 0708 10/31/23 0636 11/01/23 0527 11/02/23 0506 11/03/23 0519  BUN 32* 28* 18 15 15 16 14    CREATININE 1.56* 1.38* 1.16 1.08 1.11 1.08 1.08  Urinalysis done with trace leukocytes, nitrite negative, 11-20 WBCs. Urine Cx obtained  -Avoid Nephrotoxic Medications and continue to hold Torsemide, Contrast Dyes, Hypotension and Dehydration to Ensure Adequate Renal Perfusion and will need to Renally Adjust Meds -Patient with cough on 10/30/2023 and due to concerns for volume overload patient given Lasix 20 mg IV x 1 with urine output of 1.5 L.  -Patient also transfused a unit of PRBCs and given Lasix 20 mg IV x 1 posttransfusion on 10/31/2023 with a urine output of 1.650 L. -Continue to Monitor and Trend Renal Function carefully and repeat CMP in the AM .   Hematuria/Abnormal CT renal stone protocol -Per RN patient with hematuria early on during the hospitalization which seems to be clearing up.  -Patient with complaints of back pain however does have chronic back pain. -CT renal stone protocol with asymmetric wall thickening of the inferior right bladder worrisome for neoplasm.  Recommend cystoscopy.  Small amount of layering hyperdensity in the bladder worrisome for blood products.  Nonobstructing right renal calculi.  Left renal atrophy with adjacent presumed scarring.  Bilateral renal cysts.  Colonic diverticulosis.  Trace bilateral pleural effusions.   -Patient with prior history of bladder cancer, renal cell carcinoma status post nephrectomy, now with some hematuria and abnormal CT scan concerning for recurrent bladder cancer. -Patient seen in consultation by Urology who are recommending outpatient follow-up for cystoscopy to evaluate bladder mass.  -C/w Supportive care.   Acute Metabolic Encephalopathy -Per family patient noted to have some confusion. -Likely multifactorial secondary to dehydration in the setting of chronic opioid use for back pain. -Patient received IV antibiotics in the ED, no obvious  infection noted at this time as patient with no fever, no leukocytosis, no meningeal  signs. -WBC Trend: Recent Labs  Lab 10/28/23 1227 10/29/23 0604 10/30/23 0708 10/31/23 0636 11/01/23 0527 11/02/23 0506 11/03/23 0519  WBC 10.0 9.1 9.7 10.9* 11.3* 10.0 8.6  -Patient with no respiratory symptoms, chest x-ray negative for any acute infiltrates. -Urinalysis not strongly suggestive of UTI with urine cultures pending. -Patient with no signs of infection of lower back surgical wound site at this time. -CT head negative for any acute abnormalities. -Patient with recent labs a month ago with TSH, vitamin B12, B1 levels within normal limits. -Ammonia levels within normal limits. -Initial blood cultures drawn on admission with MRSE likely contaminant. -Repeat blood cultures x 2 pending with no growth to date.. -Patient improving clinically but slowly. -Patient had some bouts of hypotension could likely be contributing to patient's encephalopathy in the setting of narcotic use. -Continue to minimize narcotics and decreased Oxycodone to 2.5 mg p.o. every 6 hours as needed. -If no significant improvement may need to consider MRI of the brain. -C/w Supportive care.   Hypotension -Patient presentation noted to have a transient hypotension which responded to IV fluids. Patient bout of hypotension with systolic blood pressures in the 80s.   -BP fluctuating noted to have been on midodrine before in the past. -Concern for volume overload and as such saline lock IV fluids. -BP borderline, patient given Lasix 20 mg IV x 1 on 10/30/2023  -Patient with hypotension this morning with systolics in the 80s, arousable, noted to have received some oxycodone prior to this this morning. -Decrease oxycodone to 2.5 mg p.o. every 6 hours as needed. -Patient was given IV Albumin 25 q6h x 1 day -Currently remains on Midodrine 10 mg po TID with meals -Continue to hold Toprol-XL, Torsemide.  -Continue to Monitor BP per Protocol  -Last BP reading was 117/71.  Near Syncope -As noted per admitting  physician (ED report family reported near syncopal episode the day prior to admission likely secondary to dehydration and hypotension. -CT head with no acute abnormalities. -Patient with no acute focal neurological deficits. -Patient not hypoxic with no shortness of breath and low probability for PE. -Clinical improvement with hydration. -Will place on TED Hose and check Orthostatic VS   Generalized Weakness/Physical Deconditioning -PT/OT.    Diabetes Mellitus Type 2 -Hemoglobin A1c 6.4 (10/28/2023) -CBG Trend: Recent Labs  Lab 11/02/23 0734 11/02/23 1130 11/02/23 1652 11/02/23 2039 11/03/23 0754 11/03/23 1212 11/03/23 1654  GLUCAP 118* 161* 105* 144* 124* 149* 132*  -Continue to hold oral hypoglycemic agents of Glipizide 5 mg po BID -C/w Sensitive Novolog SSI AC/HS   Chronic Back Pain -Patient noted to be on chronic opioids for a while for his chronic back pain. -Due to concerns opioid may be contributing to patient's altered mental status on presentation and as such patient placed on oxycodone 5 mg every 4 hours as needed pain and reduced to 2.5 mg po q6hprn .   -Continue scheduled Tylenol 1000 mg 3 times daily.   -Patient noted to have a bout of hypotension with systolics in the 80s after received some oxycodone early on this morning. -Continue to Monitor and resume Lidocaine 5% Patch    Chronic HFrEF -2D echo from 08/05/2023 with a EF of 30%, grade 1 diastolic dysfunction, right ventricular systolic function moderately reduced, mild MVR, trivial AVR.  Patient with no signs of significant volume overload on exam however patient with coughing on 11/8-11/9 and felt likely secondary  to volume overload.. -Diuretics held on admission but he is Status post Lasix 20 mg IV x 1 10/30/2023 and Lasix 20 mg IV x 1 on 10/31/2023, with a urine output of 60 cc over the past 24 hours. -Strict I's and O's and Daily Weights  Intake/Output Summary (Last 24 hours) at 11/03/2023 1731 Last data filed  at 11/03/2023 1705 Gross per 24 hour  Intake --  Output 451 ml  Net -451 ml  -Saline lock IV fluids. -Continue to Monitor for S/Sx of Volume Overload  Hyponatremia -Na+ Trend: Recent Labs  Lab 10/28/23 1227 10/29/23 0604 10/30/23 0708 10/31/23 0636 11/01/23 0527 11/02/23 0506 11/03/23 0519  NA 136 138 141 135 133* 135 134*  -Continue to Monitor and Trend and repeat CMP in the AM   Normocytic Anemia -Patient noted with some hematuria per RN. -Hgb/Hct Trend: Recent Labs  Lab 10/29/23 0604 10/30/23 0708 10/31/23 0636 10/31/23 1835 11/01/23 0527 11/02/23 0506 11/03/23 0519  HGB 8.1* 7.8* 7.9* 9.5* 9.6* 9.3* 8.2*  HCT 26.6* 25.0* 26.0* 30.4* 29.7* 29.5* 26.6*  MCV 98.9 97.7 98.1  --  94.9 97.0 96.7  -Anemia panel with iron of 32, TIBC of 237, ferritin of 429, folate of 20.4, vitamin B12 of 385. -Status post transfusion 1 unit PRBCs.  -Continue to Monitor for S/Sx of Bleeding -Repeat CBC in the AM    GERD/GI Prophylaxis  -C/w Famotidine 40 mg po Daily    CAD status post CABG -Stable. -Continue to hold home regimen aspirin in light of concern for hematuria. -Due to transient hypotension on admission continue to hold Metoprolol Succinate 25 mg po Daily and Torsemide 20 mg po MWF. -If necessary will resume home NTG SL 0.4 mg po q47min  prn CP  Hypoalbuminemia -Patient's Albumin Trend: Recent Labs  Lab 10/28/23 1227 11/02/23 0506  ALBUMIN 2.4* 2.2*  -Continue to Monitor and Trend and repeat CMP in the AM   DVT prophylaxis: Place TED hose Start: 11/03/23 1737 Place and maintain sequential compression device Start: 10/31/23 1500    Code Status: Full Code Family Communication: Discussed with Wife and Nephew at bedside   Disposition Plan:  Level of care: Telemetry Status is: Inpatient Remains inpatient appropriate because: Needs SNF   Consultants:  Urology Dr. Thea Silversmith   Procedures:  CT head 10/28/2023 Chest x-ray 10/28/2023 CT renal stone protocol  10/30/2023 Transfuse 1 unit PRBCs 10/31/2023  Antimicrobials:  Anti-infectives (From admission, onward)    Start     Dose/Rate Route Frequency Ordered Stop   10/28/23 1300  vancomycin (VANCOREADY) IVPB 1500 mg/300 mL        1,500 mg 150 mL/hr over 120 Minutes Intravenous  Once 10/28/23 1240 10/28/23 1900   10/28/23 1245  ceFEPIme (MAXIPIME) 2 g in sodium chloride 0.9 % 100 mL IVPB        2 g 200 mL/hr over 30 Minutes Intravenous  Once 10/28/23 1236 10/28/23 1513   10/28/23 1245  metroNIDAZOLE (FLAGYL) IVPB 500 mg        500 mg 100 mL/hr over 60 Minutes Intravenous  Once 10/28/23 1236 10/28/23 1406   10/28/23 1245  vancomycin (VANCOCIN) IVPB 1000 mg/200 mL premix  Status:  Discontinued        1,000 mg 200 mL/hr over 60 Minutes Intravenous  Once 10/28/23 1236 10/28/23 1239       Subjective: Seen and examined at bedside and nephew thinks his mentation is getting better.  Patient does not complain of any pain today.  Continues to  remain confused little bit.  No nausea or vomiting.  No other concerns or complaints at this time.  Objective: Vitals:   11/02/23 1258 11/02/23 1922 11/03/23 0517 11/03/23 1503  BP: 94/60 (!) 114/97 (!) 102/58 117/71  Pulse: 97 97 (!) 102 (!) 106  Resp:    18  Temp:  (!) 97.2 F (36.2 C) 98.6 F (37 C) 98.2 F (36.8 C)  TempSrc:  Oral Oral Oral  SpO2:  97% 96% 97%  Weight:      Height:        Intake/Output Summary (Last 24 hours) at 11/03/2023 1750 Last data filed at 11/03/2023 1705 Gross per 24 hour  Intake --  Output 451 ml  Net -451 ml   Filed Weights   10/28/23 1247  Weight: 74 kg   Examination: Physical Exam:  Constitutional: Elderly overweight Caucasian male in NAD Respiratory: Diminished to auscultation bilaterally, no wheezing, rales, rhonchi or crackles. Normal respiratory effort and patient is not tachypenic. No accessory muscle use. Unlabored breathing  Cardiovascular: A little tachycardic rate, no murmurs / rubs / gallops. S1  and S2 auscultated. No extremity edema.   Abdomen: Soft, non-tender, distended 2/2 body habitus. Bowel sounds positive.  GU: Deferred. Musculoskeletal: No clubbing / cyanosis of digits/nails. No joint deformity upper and lower extremities.  Skin: No rashes, lesions, ulcers on a limited skin evaluation. No induration; Warm and dry.  Neurologic: CN 2-12 grossly intact with no focal deficits. Romberg sign and cerebellar reflexes not assessed.  Psychiatric: Normal judgment and insight. Alert and oriented x 2. Normal mood and appropriate affect.   Data Reviewed: I have personally reviewed following labs and imaging studies  CBC: Recent Labs  Lab 10/28/23 1227 10/29/23 0604 10/30/23 0708 10/31/23 0636 10/31/23 1835 11/01/23 0527 11/02/23 0506 11/03/23 0519  WBC 10.0   < > 9.7 10.9*  --  11.3* 10.0 8.6  NEUTROABS 6.6  --  6.4 7.3  --   --  6.1  --   HGB 8.4*   < > 7.8* 7.9* 9.5* 9.6* 9.3* 8.2*  HCT 27.3*   < > 25.0* 26.0* 30.4* 29.7* 29.5* 26.6*  MCV 98.2   < > 97.7 98.1  --  94.9 97.0 96.7  PLT 328   < > 309 314  --  322 325 287   < > = values in this interval not displayed.   Basic Metabolic Panel: Recent Labs  Lab 10/29/23 0604 10/30/23 0708 10/31/23 0636 11/01/23 0527 11/02/23 0506 11/03/23 0519  NA 138 141 135 133* 135 134*  K 3.6 4.0 3.7 3.5 3.6 3.8  CL 104 109 103 99 100 100  CO2 25 23 24 24 25 26   GLUCOSE 125* 138* 122* 128* 116* 114*  BUN 28* 18 15 15 16 14   CREATININE 1.38* 1.16 1.08 1.11 1.08 1.08  CALCIUM 10.6* 10.5* 10.2 9.9 10.1 10.2  MG 2.0  --   --   --   --   --   PHOS  --   --   --   --  2.3*  --    GFR: Estimated Creatinine Clearance: 43.5 mL/min (by C-G formula based on SCr of 1.08 mg/dL). Liver Function Tests: Recent Labs  Lab 10/28/23 1227 11/02/23 0506  AST 16  --   ALT 8  --   ALKPHOS 49  --   BILITOT 0.4  --   PROT 6.6  --   ALBUMIN 2.4* 2.2*   No results for input(s): "LIPASE", "AMYLASE" in  the last 168 hours. Recent Labs  Lab  10/28/23 2153  AMMONIA 10   Coagulation Profile: Recent Labs  Lab 10/28/23 1300  INR 1.1   Cardiac Enzymes: No results for input(s): "CKTOTAL", "CKMB", "CKMBINDEX", "TROPONINI" in the last 168 hours. BNP (last 3 results) No results for input(s): "PROBNP" in the last 8760 hours. HbA1C: No results for input(s): "HGBA1C" in the last 72 hours. CBG: Recent Labs  Lab 11/02/23 1652 11/02/23 2039 11/03/23 0754 11/03/23 1212 11/03/23 1654  GLUCAP 105* 144* 124* 149* 132*   Lipid Profile: No results for input(s): "CHOL", "HDL", "LDLCALC", "TRIG", "CHOLHDL", "LDLDIRECT" in the last 72 hours. Thyroid Function Tests: No results for input(s): "TSH", "T4TOTAL", "FREET4", "T3FREE", "THYROIDAB" in the last 72 hours. Anemia Panel: No results for input(s): "VITAMINB12", "FOLATE", "FERRITIN", "TIBC", "IRON", "RETICCTPCT" in the last 72 hours. Sepsis Labs: Recent Labs  Lab 10/28/23 1227  LATICACIDVEN 1.0   Recent Results (from the past 240 hour(s))  Resp panel by RT-PCR (RSV, Flu A&B, Covid) Anterior Nasal Swab     Status: None   Collection Time: 10/28/23 12:45 PM   Specimen: Anterior Nasal Swab  Result Value Ref Range Status   SARS Coronavirus 2 by RT PCR NEGATIVE NEGATIVE Final    Comment: (NOTE) SARS-CoV-2 target nucleic acids are NOT DETECTED.  The SARS-CoV-2 RNA is generally detectable in upper respiratory specimens during the acute phase of infection. The lowest concentration of SARS-CoV-2 viral copies this assay can detect is 138 copies/mL. A negative result does not preclude SARS-Cov-2 infection and should not be used as the sole basis for treatment or other patient management decisions. A negative result may occur with  improper specimen collection/handling, submission of specimen other than nasopharyngeal swab, presence of viral mutation(s) within the areas targeted by this assay, and inadequate number of viral copies(<138 copies/mL). A negative result must be combined  with clinical observations, patient history, and epidemiological information. The expected result is Negative.  Fact Sheet for Patients:  BloggerCourse.com  Fact Sheet for Healthcare Providers:  SeriousBroker.it  This test is no t yet approved or cleared by the [place] FDA and  has been authorized for detection and/or diagnosis of SARS-CoV-2 by FDA under an Emergency Use Authorization (EUA). This EUA will remain  in effect (meaning this test can be used) for the duration of the COVID-19 declaration under Section 564(b)(1) of the Act, 21 U.S.C.section 360bbb-3(b)(1), unless the authorization is terminated  or revoked sooner.       Influenza A by PCR NEGATIVE NEGATIVE Final   Influenza B by PCR NEGATIVE NEGATIVE Final    Comment: (NOTE) The Xpert Xpress SARS-CoV-2/FLU/RSV plus assay is intended as an aid in the diagnosis of influenza from Nasopharyngeal swab specimens and should not be used as a sole basis for treatment. Nasal washings and aspirates are unacceptable for Xpert Xpress SARS-CoV-2/FLU/RSV testing.  Fact Sheet for Patients: BloggerCourse.com  Fact Sheet for Healthcare Providers: SeriousBroker.it  This test is not yet approved or cleared by the [place] FDA and has been authorized for detection and/or diagnosis of SARS-CoV-2 by FDA under an Emergency Use Authorization (EUA). This EUA will remain in effect (meaning this test can be used) for the duration of the COVID-19 declaration under Section 564(b)(1) of the Act, 21 U.S.C. section 360bbb-3(b)(1), unless the authorization is terminated or revoked.     Resp Syncytial Virus by PCR NEGATIVE NEGATIVE Final    Comment: (NOTE) Fact Sheet for Patients: BloggerCourse.com  Fact Sheet for Healthcare Providers: SeriousBroker.it  This test is not yet approved  or cleared by the [place] and has been authorized for detection and/or diagnosis of SARS-CoV-2 by FDA under an Emergency Use Authorization (EUA). This EUA will remain in effect (meaning this test can be used) for the duration of the COVID-19 declaration under Section 564(b)(1) of the Act, 21 U.S.C. section 360bbb-3(b)(1), unless the authorization is terminated or revoked.  Performed at [facility], [address], [place], [place] [address]   Blood Culture (routine x 2)     Status: Abnormal   Collection Time: 10/28/23 12:50 PM   Specimen: BLOOD  Result Value Ref Range Status   Specimen Description   Final    BLOOD SITE NOT SPECIFIED Performed at [facility], [address], [place], [place] [address]    Special Requests   Final    BOTTLES DRAWN AEROBIC AND ANAEROBIC Blood Culture results may not be optimal due to an inadequate volume of blood received in culture bottles Performed at [facility], [address], [place], [place] [address]    Culture  Setup Time   Final    GRAM POSITIVE COCCI BOTTLES DRAWN AEROBIC ONLY CRITICAL RESULT CALLED TO, READ BACK BY AND VERIFIED WITH: M LILLISTON,[email or web address]@[email or web address]  10/30/23 MK    Culture (A)  Final    STAPHYLOCOCCUS EPIDERMIDIS THE SIGNIFICANCE OF ISOLATING THIS ORGANISM FROM A SINGLE SET OF BLOOD CULTURES WHEN MULTIPLE SETS ARE DRAWN IS UNCERTAIN. PLEASE NOTIFY THE MICROBIOLOGY DEPARTMENT WITHIN ONE WEEK IF SPECIATION AND SENSITIVITIES ARE REQUIRED. Performed at [facility], [address] [place], [place] [address]    Report Status 11/01/2023 FINAL  Final  Blood Culture ID Panel (Reflexed)     Status: Abnormal   Collection Time: 10/28/23 12:50 PM  Result Value Ref Range Status   Enterococcus faecalis NOT DETECTED NOT DETECTED Final   Enterococcus Faecium NOT DETECTED NOT DETECTED Final   Listeria monocytogenes NOT DETECTED NOT DETECTED Final   Staphylococcus  species DETECTED (A) NOT DETECTED Final    Comment: CRITICAL RESULT CALLED TO, READ BACK BY AND VERIFIED WITH: M LILLISTON,PHARMD@[email or web address]  10/30/23 MK    Staphylococcus aureus (BCID) NOT DETECTED NOT DETECTED Final   Staphylococcus epidermidis DETECTED (A) NOT DETECTED Final    Comment: Methicillin (oxacillin) resistant coagulase negative staphylococcus. Possible blood culture contaminant (unless isolated from more than one blood culture draw or clinical case suggests pathogenicity). No antibiotic treatment is indicated for blood  culture contaminants. CRITICAL RESULT CALLED TO, READ BACK BY AND VERIFIED WITH: M LILLISTON,PHARMD@[email or web address]  10/30/23 MK    Staphylococcus lugdunensis NOT DETECTED NOT DETECTED Final   Streptococcus species NOT DETECTED NOT DETECTED Final   Streptococcus agalactiae NOT DETECTED NOT DETECTED Final   Streptococcus pneumoniae NOT DETECTED NOT DETECTED Final   Streptococcus pyogenes NOT DETECTED NOT DETECTED Final   A.calcoaceticus-baumannii NOT DETECTED NOT DETECTED Final   Bacteroides fragilis NOT DETECTED NOT DETECTED Final   Enterobacterales NOT DETECTED NOT DETECTED Final   Enterobacter cloacae complex NOT DETECTED NOT DETECTED Final   Escherichia coli NOT DETECTED NOT DETECTED Final   Klebsiella aerogenes NOT DETECTED NOT DETECTED Final   Klebsiella oxytoca NOT DETECTED NOT DETECTED Final   Klebsiella pneumoniae NOT DETECTED NOT DETECTED Final   Proteus species NOT DETECTED NOT DETECTED Final   Salmonella species NOT DETECTED NOT DETECTED Final   Serratia marcescens NOT DETECTED NOT DETECTED Final   Haemophilus influenzae NOT DETECTED NOT DETECTED Final   Neisseria meningitidis NOT DETECTED NOT  DETECTED Final   Pseudomonas aeruginosa NOT DETECTED NOT DETECTED Final   Stenotrophomonas maltophilia NOT DETECTED NOT DETECTED Final   Candida albicans NOT DETECTED NOT DETECTED Final   Candida auris NOT DETECTED NOT DETECTED Final   Candida glabrata NOT DETECTED NOT  DETECTED Final   Candida krusei NOT DETECTED NOT DETECTED Final   Candida parapsilosis NOT DETECTED NOT DETECTED Final   Candida tropicalis NOT DETECTED NOT DETECTED Final   Cryptococcus neoformans/gattii NOT DETECTED NOT DETECTED Final   Methicillin resistance mecA/C DETECTED (A) NOT DETECTED Final    Comment: CRITICAL RESULT CALLED TO, READ BACK BY AND VERIFIED WITH: M LILLISTON,PHARMD@[email or web address]  10/30/23 MK Performed at [facility], [address] [place], [place] [address]   Blood Culture (routine x 2)     Status: None   Collection Time: 10/28/23  1:00 PM   Specimen: BLOOD  Result Value Ref Range Status   Specimen Description   Final    BLOOD SITE NOT SPECIFIED Performed at [facility], [address], [place], [place] [address]    Special Requests   Final    BOTTLES DRAWN AEROBIC AND ANAEROBIC Blood Culture adequate volume Performed at [facility], [address], [place], [place] [address]    Culture   Final    NO GROWTH 5 DAYS Performed at [facility], [address] [place], [place] [address]    Report Status 11/02/2023 FINAL  Final  Urine Culture     Status: Abnormal   Collection Time: 10/28/23  2:39 PM   Specimen: Urine, Random  Result Value Ref Range Status   Specimen Description   Final    URINE, RANDOM Performed at [facility], [address], [place], [place] [address]    Special Requests   Final    NONE Reflexed from [id number] Performed at [facility], [address] [place], [place] [address]    Culture (A)  Final    <10,000 COLONIES/mL INSIGNIFICANT GROWTH Performed at [facility], [address], [place], [place] [address]    Report Status 10/29/2023 FINAL  Final  Urine Culture (for pregnant, neutropenic or urologic patients or patients with an indwelling urinary catheter)     Status: None   Collection Time: 10/29/23 12:03 PM   Specimen: Urine, Clean Catch   Result Value Ref Range Status   Specimen Description   Final    URINE, CLEAN CATCH Performed at [facility], [address], [place], [place] [address]    Special Requests   Final    NONE Performed at [facility], [address] [place], [place] [address]    Culture   Final    NO GROWTH Performed at [facility], [address] [place], [place] [address]    Report Status 10/30/2023 FINAL  Final  Culture, blood (Routine X 2) w Reflex to ID Panel     Status: None (Preliminary result)   Collection Time: 10/30/23 12:16 PM   Specimen: BLOOD  Result Value Ref Range Status   Specimen Description   Final    BLOOD BLOOD RIGHT ARM Performed at [facility], [address] [place], [place] [address]    Special Requests   Final    BOTTLES DRAWN AEROBIC AND ANAEROBIC Blood Culture adequate volume Performed at [facility], [address], [place], [place] [address]    Culture   Final  NO GROWTH 4 DAYS Performed at [facility], [address] [place], [place] [address]    Report Status PENDING  Incomplete  Culture, blood (Routine X 2) w Reflex to ID Panel     Status: None (Preliminary result)   Collection Time: 10/30/23 12:19 PM   Specimen: BLOOD  Result Value Ref Range Status   Specimen Description   Final    BLOOD BLOOD LEFT HAND Performed at [facility], [address], [place], [place] [address]    Special Requests   Final    BOTTLES DRAWN AEROBIC AND ANAEROBIC Blood Culture adequate volume Performed at [facility], [address] [place], [place] [address]    Culture   Final    NO GROWTH 4 DAYS Performed at [facility], [address] [place], [place] [address]    Report Status PENDING  Incomplete    Radiology Studies: No results found.  Scheduled Meds:  acetaminophen  1,000 mg Oral TID   cholecalciferol  5,000 Units Oral  Daily   DULoxetine  20 mg Oral BID   famotidine  40 mg Oral Daily   insulin aspart  0-5 Units Subcutaneous QHS   insulin aspart  0-9 Units Subcutaneous TID WC   lidocaine  15 mL Oral Once   midodrine  10 mg Oral TID WC   multivitamin with minerals  1 tablet Oral QHS   mouth rinse  15 mL Mouth Rinse 4 times per day   phosphorus  250 mg Oral BID   polyvinyl alcohol  1 drop Left Eye BID   Ensure Max Protein  11 oz Oral Daily   sodium chloride flush  10 mL Intravenous Q12H   tamsulosin  0.4 mg Oral BID   Continuous Infusions:   LOS: 4 days   Marguerita Merles, DO [facility] Available via Epic secure chat 7am-7pm After these hours, please refer to coverage provider listed on [email or web address] 11/03/2023, 5:50 PM

## 2023-11-03 NOTE — TOC Progression Note (Signed)
Transition of Care [facility]) - Progression Note    Patient Details  Name: [name] MRN: [id number] Date of Birth: [birth year]  Transition of Care [facility]) CM/SW Contact  [name], LCSW Phone Number: 11/03/2023, 1:09 PM  Clinical Narrative:    Insurance auth requested for pt to return to [facility] for SNF.    Expected Discharge Plan: Skilled Nursing Facility Barriers to Discharge: No Barriers Identified  Expected Discharge Plan and Services In-house Referral: Clinical Social Work   Post Acute Care Choice: Skilled Nursing Facility, Resumption of Svcs/PTA Provider Living arrangements for the past 2 months: Single Family Home                 DME Arranged: N/A DME Agency: NA                   Social Determinants of Health (SDOH) Interventions SDOH Screenings   Food Insecurity: Patient Unable To Answer (10/28/2023)  Housing: High Risk (10/28/2023)  Transportation Needs: Patient Unable To Answer (10/28/2023)  Utilities: Patient Unable To Answer (10/28/2023)  Social Connections: Unknown (05/05/2022)   Received from [facility]  Stress: No Stress Concern Present (09/06/2023)   Received from [facility]  Tobacco Use: Medium Risk (10/28/2023)    Readmission Risk Interventions    11/03/2023    1:08 PM  Readmission Risk Prevention Plan  Transportation Screening Complete  PCP or Specialist Appt within 5-7 Days Complete  Home Care Screening Complete  Medication Review (RN CM) Complete

## 2023-11-04 ENCOUNTER — Ambulatory Visit: Payer: PPO | Admitting: Internal Medicine

## 2023-11-04 DIAGNOSIS — R4182 Altered mental status, unspecified: Secondary | ICD-10-CM | POA: Diagnosis not present

## 2023-11-04 DIAGNOSIS — I10 Essential (primary) hypertension: Secondary | ICD-10-CM | POA: Diagnosis not present

## 2023-11-04 DIAGNOSIS — N179 Acute kidney failure, unspecified: Secondary | ICD-10-CM | POA: Diagnosis not present

## 2023-11-04 DIAGNOSIS — G9341 Metabolic encephalopathy: Secondary | ICD-10-CM | POA: Diagnosis not present

## 2023-11-04 LAB — CBC WITH DIFFERENTIAL/PLATELET
Abs Immature Granulocytes: 0.03 10*3/uL (ref 0.00–0.07)
Basophils Absolute: 0.1 10*3/uL (ref 0.0–0.1)
Basophils Relative: 1 %
Eosinophils Absolute: 0.3 10*3/uL (ref 0.0–0.5)
Eosinophils Relative: 3 %
HCT: 30.5 % — ABNORMAL LOW (ref 39.0–52.0)
Hemoglobin: 9.5 g/dL — ABNORMAL LOW (ref 13.0–17.0)
Immature Granulocytes: 0 %
Lymphocytes Relative: 21 %
Lymphs Abs: 2.1 10*3/uL (ref 0.7–4.0)
MCH: 30 pg (ref 26.0–34.0)
MCHC: 31.1 g/dL (ref 30.0–36.0)
MCV: 96.2 fL (ref 80.0–100.0)
Monocytes Absolute: 0.8 10*3/uL (ref 0.1–1.0)
Monocytes Relative: 9 %
Neutro Abs: 6.5 10*3/uL (ref 1.7–7.7)
Neutrophils Relative %: 66 %
Platelets: 321 10*3/uL (ref 150–400)
RBC: 3.17 MIL/uL — ABNORMAL LOW (ref 4.22–5.81)
RDW: 14.6 % (ref 11.5–15.5)
WBC: 9.8 10*3/uL (ref 4.0–10.5)
nRBC: 0 % (ref 0.0–0.2)

## 2023-11-04 LAB — COMPREHENSIVE METABOLIC PANEL
ALT: 12 U/L (ref 0–44)
AST: 22 U/L (ref 15–41)
Albumin: 3.1 g/dL — ABNORMAL LOW (ref 3.5–5.0)
Alkaline Phosphatase: 45 U/L (ref 38–126)
Anion gap: 8 (ref 5–15)
BUN: 15 mg/dL (ref 8–23)
CO2: 26 mmol/L (ref 22–32)
Calcium: 10.1 mg/dL (ref 8.9–10.3)
Chloride: 98 mmol/L (ref 98–111)
Creatinine, Ser: 1.08 mg/dL (ref 0.61–1.24)
GFR, Estimated: >60 mL/min (ref >60)
Glucose, Bld: 135 mg/dL — ABNORMAL HIGH (ref 70–99)
Potassium: 3.9 mmol/L (ref 3.5–5.1)
Sodium: 132 mmol/L — ABNORMAL LOW (ref 135–145)
Total Bilirubin: 0.9 mg/dL (ref <1.2)
Total Protein: 6.3 g/dL — ABNORMAL LOW (ref 6.5–8.1)

## 2023-11-04 LAB — CULTURE, BLOOD (ROUTINE X 2)
Culture: NO GROWTH
Culture: NO GROWTH
Special Requests: ADEQUATE
Special Requests: ADEQUATE

## 2023-11-04 LAB — MAGNESIUM: Magnesium: 1.9 mg/dL (ref 1.7–2.4)

## 2023-11-04 LAB — GLUCOSE, CAPILLARY
Glucose-Capillary: 138 mg/dL — ABNORMAL HIGH (ref 70–99)
Glucose-Capillary: 150 mg/dL — ABNORMAL HIGH (ref 70–99)
Glucose-Capillary: 132 mg/dL — ABNORMAL HIGH (ref 70–99)
Glucose-Capillary: 100 mg/dL — ABNORMAL HIGH (ref 70–99)

## 2023-11-04 LAB — PHOSPHORUS: Phosphorus: 2.6 mg/dL (ref 2.5–4.6)

## 2023-11-04 MED ORDER — OXYCODONE HCL 5 MG PO TABS: 2.5000 mg | ORAL_TABLET | Freq: Four times a day (QID) | ORAL | 0 refills | Status: AC | PRN

## 2023-11-04 MED ORDER — ONDANSETRON HCL 4 MG/2ML IJ SOLN: 4.0000 mg | Freq: Four times a day (QID) | INTRAMUSCULAR | Status: DC | PRN

## 2023-11-04 MED ORDER — MIDODRINE HCL 10 MG PO TABS: 10.0000 mg | ORAL_TABLET | Freq: Three times a day (TID) | ORAL | Status: DC

## 2023-11-04 MED ORDER — ACETAMINOPHEN 500 MG PO TABS: 1000.0000 mg | ORAL_TABLET | Freq: Three times a day (TID) | ORAL | Status: DC

## 2023-11-04 MED ORDER — HYDROCODONE-ACETAMINOPHEN 10-325 MG PO TABS
1.0000 | ORAL_TABLET | Freq: Three times a day (TID) | ORAL | Status: DC
  Administered 2023-11-04: 1 via ORAL
  Filled 2023-11-04: qty 1

## 2023-11-04 MED ORDER — FAMOTIDINE 20 MG PO TABS: 20.0000 mg | ORAL_TABLET | Freq: Every day | ORAL | Status: DC

## 2023-11-04 NOTE — TOC Transition Note (Addendum)
Transition of Care [facility]) - CM/SW Discharge Note   Patient Details  Name: [name] MRN: [id number] Date of Birth: 10-Sep-1934  Transition of Care [facility]) CM/SW Contact:  [name], LCSW Phone Number: 11/04/2023, 2:20 PM   Clinical Narrative:    Pt to transfer to [facility] for LTC placement. Pt will be going to room 504a. RN to call report to [phone]. Pt referred to [facility] for palliative care/hospice follow up. Pt will be going via [facility]. DC packet placed at RN station. [facility] called at 11:00am for transportation.    Final next level of care: Long Term Nursing Home Barriers to Discharge: No Barriers Identified   Patient Goals and CMS Choice CMS Medicare.gov Compare Post Acute Care list provided to:: Patient Represenative (must comment) Choice offered to / list presented to : [facility] POA / Guardian  Discharge Placement                Patient chooses bed at: [facility] Patient to be transferred to facility by: [facility] Name of family member notified: Nephew Patient and family notified of of transfer: 11/04/23  Discharge Plan and Services Additional resources added to the After Visit Summary for   In-house Referral: Clinical Social Work   Post Acute Care Choice: Skilled Nursing Facility, Resumption of Svcs/PTA Provider          DME Arranged: N/A DME Agency: NA                  Social Determinants of Health (SDOH) Interventions SDOH Screenings   Food Insecurity: Patient Unable To Answer (10/28/2023)  Housing: High Risk (10/28/2023)  Transportation Needs: Patient Unable To Answer (10/28/2023)  Utilities: Patient Unable To Answer (10/28/2023)  Social Connections: Unknown (05/05/2022)   Received from [facility]  Stress: No Stress Concern Present (09/06/2023)   Received from [facility]  Tobacco Use: Medium Risk (10/28/2023)     Readmission Risk Interventions    11/03/2023    1:08 PM  Readmission Risk Prevention Plan  Transportation Screening  Complete  PCP or Specialist Appt within 5-7 Days Complete  Home Care Screening Complete  Medication Review (RN CM) Complete

## 2023-11-04 NOTE — Plan of Care (Signed)

## 2023-11-04 NOTE — Progress Notes (Deleted)
Cardiology Office Note:  .    Date:  11/04/2023  ID:  [name], DOB 04-Jul-1934, MRN [id number] PCP: [name], MD  [facility] Providers Cardiologist:  Christell Constant, MD Advanced Heart Failure:  Marca Ancona, MD     CC: Follow up stress test  History of Present Illness: .    [name] is a 89 y.o. 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  Patient notes that he is doing well.   Since last visit notes that he neuropathy and back are the things that bother him. There are no interval hospital/ED visit.   EKG showed SR with rare PACs.  No chest pain or pressure .  No SOB/DOE and no PND/Orthopnea.  No weight gain (has some inadvertent weight loss juggling things)   No palpitations or syncope.  Relevant histories: .  2021: LVEF recovered no long following with Dr. Shirlee Latch 2024: Had new drop in LVEF.  Had new infarct.  Has poor kidneys.  Is pending back surgery. Social- saw Dr. Katrinka Blazing, former use car salesman and owner.  Wife has Dementia. ROS: As per HPI.   Studies Reviewed: .   Cardiac Studies & Procedures   CARDIAC CATHETERIZATION  CARDIAC CATHETERIZATION 09/01/2023  Narrative   Mid LM to Dist LM lesion is 100% stenosed.  LIMA to LAD is patent.  SCG to ramus is occluded.   Prox RCA lesion is 100% stenosed.  Mid RCA to Dist RCA lesion is 100% stenosed. SVG to PDA is patent.  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   Dist Graft lesion is 50% stenosed.   Ost 1st Diag lesion is 100% stenosed.   SVG.   In the absence of any other complications or medical issues, we expect the patient to be ready for discharge from a cath perspective on 09/01/2023.   Recommend Aspirin 81mg  daily for moderate CAD.  Severe three-vessel CAD.  Patent LIMA to LAD.  Patent SVG to PDA.  Aneurysmal segment at the distal anastomosis which is new  from prior catheterization.  Medical therapy.  Consider f/u imaging  Findings Coronary Findings Diagnostic  Dominance: Right  Left Main LM lesion is 100% stenosed. Mid LM to Dist LM lesion is 100% stenosed.  Left Anterior Descending  First Diagonal Branch Ost 1st Diag lesion is 100% stenosed.  Second Diagonal Branch Vessel is small in size.  Third Diagonal Branch Vessel is small in size.  Ramus Intermedius Collaterals Ramus filled by collaterals from 3rd RPL.  Left Circumflex  First Obtuse Marginal Branch Collaterals 1st Mrg filled by collaterals from 3rd RPL.  Third Obtuse Marginal Branch  Right Coronary Artery Prox RCA lesion is 100% stenosed. Mid RCA to Dist RCA lesion is 100% stenosed.  Right Posterior Descending Artery Vessel is small in size.  First Right Posterolateral Branch Vessel is small in size.  LIMA LIMA Graft To Dist LAD LIMA.  Graft To RPAV And is very large.  Aneurysm noted at the distal anastomosis. Dist Graft lesion is 50% stenosed. Similar to prior  Saphenous Graft To Ramus SVG. Prox Graft lesion is 100% stenosed.  Intervention  No interventions have been documented.   CARDIAC CATHETERIZATION  CARDIAC CATHETERIZATION 11/03/2016  Narrative  Severe native vessel coronary disease with total occlusion of the left main and native right coronary.  Bypass graft failure with occlusion of the  saphenous vein graft to the ramus intermedius.  Widely patent saphenous vein graft to the PDA and PL branch.  Widely patent LIMA to the LAD  Normal pulmonary artery pressures including capillary wedge.  Findings Coronary Findings Diagnostic  Dominance: Right  Left Main  Left Anterior Descending  First Diagonal Branch  Second Diagonal Branch Vessel is small in size.  Third Diagonal Branch Vessel is small in size.  Right Coronary Artery  Right Posterior Descending Artery Vessel is small in size.  First Right Posterolateral  Branch Vessel is small in size.  LIMA LIMA Graft To Dist LAD LIMA.  Graft To RPAV and is very large.  Saphenous Graft To Ramus SVG.  Intervention  No interventions have been documented.   STRESS TESTS  MYOCARDIAL PERFUSION IMAGING 08/13/2023  Narrative   Findings are consistent with infarction. The study is high risk.   No ST deviation was noted.   Left ventricular function is abnormal. Global function is severely reduced. There were multiple regional abnormalities. End diastolic cavity size is severely enlarged. End systolic cavity size is severely enlarged.   Prior study available for comparison from 10/22/2016.  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  LVEF is similar from prior.  Given LVEF and burden of infarct, high risk study.   ECHOCARDIOGRAM  ECHOCARDIOGRAM COMPLETE 08/05/2023  Narrative ECHOCARDIOGRAM REPORT    Patient Name:   [name]  Date of Exam: 08/05/2023 Medical Rec #:  [id number]     Height:       66.5 in Accession #:    [id number]    Weight:       168.0 lb Date of Birth:  Feb 02, 1934     BSA:          1.867 m Patient Age:    88 years      BP:           120/58 mmHg Patient Gender: M             HR:           82 bpm. Exam Location:  [address]  Procedure: 2D Echo, Cardiac Doppler and Color Doppler  MODIFIED REPORT: This report was modified by Dietrich Pates MD on 08/05/2023 due to Amend AV findings. Indications:     Z95.2 S/P TAVR  History:         Patient has prior history of Echocardiogram examinations, most recent 02/15/2020. CHF, CAD, Prior CABG; Risk Factors:Hypertension, Dyslipidemia and Diabetes. S/P partial L nephrectomy. Prostate cancer s/p radiation.  Sonographer:     Cathie Beams RCS Referring Phys:  [id number] [facility]as Diagnosing Phys: Dietrich Pates MD  IMPRESSIONS   1. Diffuse hypokinesis worse in the inferior, anterior, septal walls. . Left ventricular ejection fraction, by estimation, is 30%. The left ventricle has severely  decreased function. The left ventricular internal cavity size was moderately dilated. There is mild left ventricular hypertrophy. Left ventricular diastolic parameters are consistent with Grade I diastolic dysfunction (impaired relaxation). 2. Right ventricular systolic function is moderately reduced. The right ventricular size is normal. 3. The mitral valve is normal in structure. Mild mitral valve regurgitation. 4. S/p TAVR (Procedure date 07/02/2017 with 29 mm Sapien 3 prosthesis). Peak and mean gradients through the valve are 17 and 10 mm HG respectively. COmpared to echo from 2021, no significant change in mean gradient.. The aortic valve has been repaired/replaced. Aortic valve regurgitation is trivial. 5. The inferior vena cava is normal in size with greater than 50% respiratory variability,  suggesting right atrial pressure of 3 mmHg.  FINDINGS Left Ventricle: Diffuse hypokinesis worse in the inferior, anterior, septal walls. Left ventricular ejection fraction, by estimation, is 30%. The left ventricle has severely decreased function. The left ventricular internal cavity size was moderately dilated. There is mild left ventricular hypertrophy. Left ventricular diastolic parameters are consistent with Grade I diastolic dysfunction (impaired relaxation).  Right Ventricle: The right ventricular size is normal. Right vetricular wall thickness was not assessed. Right ventricular systolic function is moderately reduced.  Left Atrium: Left atrial size was normal in size.  Right Atrium: Right atrial size was normal in size.  Pericardium: There is no evidence of pericardial effusion.  Mitral Valve: The mitral valve is normal in structure. Mild mitral valve regurgitation.  Tricuspid Valve: The tricuspid valve is normal in structure. Tricuspid valve regurgitation is trivial.  Aortic Valve: S/p TAVR (Procedure date 07/02/2017 with 29 mm Sapien 3 prosthesis). Peak and mean gradients through the valve  are 17 and 10 mm HG respectively. COmpared to echo from 2021, no significant change in mean gradient. The aortic valve has been repaired/replaced. Aortic valve regurgitation is trivial. Aortic valve mean gradient measures 10.0 mmHg. Aortic valve peak gradient measures 16.8 mmHg.  Pulmonic Valve: The pulmonic valve was normal in structure. Pulmonic valve regurgitation is not visualized.  Aorta: The aortic root and ascending aorta are structurally normal, with no evidence of dilitation.  Venous: The inferior vena cava is normal in size with greater than 50% respiratory variability, suggesting right atrial pressure of 3 mmHg.  IAS/Shunts: No atrial level shunt detected by color flow Doppler.   LEFT VENTRICLE PLAX 2D LVIDd:         6.00 cm Diastology LVIDs:         4.60 cm LV e' medial:    5.11 cm/s LV PW:         1.20 cm LV E/e' medial:  12.3 LV IVS:        0.80 cm LV e' lateral:   8.49 cm/s LV E/e' lateral: 7.4   RIGHT VENTRICLE RV Basal diam:  2.00 cm RV S prime:     6.25 cm/s TAPSE (M-mode): 1.1 cm  LEFT ATRIUM             Index        RIGHT ATRIUM           Index LA diam:        3.70 cm 1.98 cm/m   RA Area:     11.50 cm LA Vol (A2C):   25.3 ml 13.55 ml/m  RA Volume:   21.80 ml  11.67 ml/m LA Vol (A4C):   31.7 ml 16.98 ml/m LA Biplane Vol: 29.2 ml 15.64 ml/m AORTIC VALVE AV Vmax:           205.00 cm/s AV Vmean:          142.000 cm/s AV VTI:            0.403 m AV Peak Grad:      16.8 mmHg AV Mean Grad:      10.0 mmHg LVOT Vmax:         90.10 cm/s LVOT Vmean:        56.900 cm/s LVOT VTI:          0.178 m LVOT/AV VTI ratio: 0.44  AORTA Ao Asc diam: 3.90 cm  MITRAL VALVE MV Area (PHT): 2.83 cm    SHUNTS MV Decel Time: 268 msec    Systemic  VTI: 0.18 m MR Peak grad: 113.2 mmHg MR Mean grad: 63.0 mmHg MR Vmax:      532.00 cm/s MR Vmean:     361.0 cm/s MV E velocity: 63.00 cm/s MV A velocity: 93.80 cm/s MV E/A ratio:  0.67  Dietrich Pates MD Electronically signed  by Dietrich Pates MD Signature Date/Time: 08/05/2023/3:40:26 PM    Final (Updated)   TEE  ECHO TEE 06/22/2017  Narrative *[place]* *[facility]* [address] [place], [place] [address] [phone]  ------------------------------------------------------------------- Transthoracic Echocardiography  Patient:    [name], [name] MR #:       [id number] Study Date: 06/22/2017 Gender:     M Age:        82 Height:     168.9 cm Weight:     88.5 kg BSA:        2.07 m^2 Pt. Status: Room:       [facility]  ADMITTING    Tonny Bollman, MD ATTENDING    Tonny Bollman, MD ORDERING     Tonny Bollman, MD REFERRING    Tonny Bollman, MD PERFORMING   Marca Ancona, M.D. SONOGRAPHER  Arvil Chaco  cc:  -------------------------------------------------------------------  ------------------------------------------------------------------- Indications:     Aortic valve repair/replacement  ------------------------------------------------------------------- Impressions:  - Only post-TAVR images available. 1. Normal right ventricular size and systolic function. 2. Normal left ventricular size with mild LV hypertrophy. EF estimated 50%. 3. Well-seated bioprosthetic aortic valve s/p TAVR. Trivial peri-valvular regurgitation. No significant stenosis, mean gradient 6 mmHg. 4. Mild mitral regurgitation. 5. Trivial tricuspid regurgitation.  ------------------------------------------------------------------- Study data:   Study status:  Routine.  Procedure:  Transthoracic echocardiography. Image quality was adequate. Transthoracic echocardiography.  M-mode, complete 2D, spectral Doppler, and color Doppler.  Birthdate:  Patient birthdate: March 08, 1934.  Age:  Patient is 89 yr old.  Sex:  Gender: male. BMI: 31 kg/m^2.  Patient status:  Inpatient.  Study date:  Study date: 06/22/2017. Study time: 08:39  AM.  -------------------------------------------------------------------  ------------------------------------------------------------------- Aortic valve:   Doppler:     Mean gradient (S): 4 mm Hg. Peak gradient (S): 6 mm Hg.  ------------------------------------------------------------------- Pre bypass:  Post bypass:  ------------------------------------------------------------------- Measurements  Aortic valve                       Value Aortic valve peak velocity, S      127   cm/s Aortic valve mean velocity, S      94.4  cm/s Aortic valve VTI, S                25.6  cm Aortic mean gradient, S            4     mm Hg Aortic peak gradient, S            6     mm Hg  Legend: (L)  and  (H)  mark values outside specified reference range.  ------------------------------------------------------------------- Prepared and Electronically Authenticated by  Marca Ancona, M.D. 2018-07-03T16:12:30    CT SCANS  CT CORONARY MORPH W/CTA COR W/SCORE 06/11/2017  Addendum 06/11/2017  4:10 PM ADDENDUM REPORT: 06/11/2017 16:08  CLINICAL DATA:  89 year old male with severe aortic stenosis being evaluated for TAVR.  EXAM: Cardiac TAVR CT  TECHNIQUE: The patient was scanned on a Philips 256 scanner. A 120 kV retrospective scan was triggered in the descending thoracic aorta at 111 HU's. Gantry rotation speed was 270 msecs and collimation was .9 mm. 10 mg of  iv Metoprolol and no nitro were given. The 3D data set was reconstructed in 5% intervals of the R-R cycle. Systolic and diastolic phases were analyzed on a dedicated work station using MPR, MIP and VRT modes. The patient received 80 cc of contrast.  FINDINGS: Aortic Valve: Trileaflet, moderately thickened and calcified aortic valve with severely restricted leaflet opening. No calcifications extending into the LVOT. LVOT is large.  Aorta:  Normal size, trivial calcifications.  No dissection.  Sinotubular Junction:  30 x 28  mm  Ascending Thoracic Aorta:  37 x 36 mm  Aortic Arch:  Not visualized  Descending Thoracic Aorta:  26 x 23 mm  Sinus of Valsalva Measurements:  Non-coronary:  34 mm  Right -coronary:  29 mm  Left -coronary:  34 mm  Coronary Artery Height above Annulus:  Left Main:  14 mm  Right Coronary:  13 mm  Virtual Basal Annulus Measurements:  Maximum/Minimum Diameter:  28 x 24 mm  Perimeter:  84 mm  Area:  535 mm2  Optimum Fluoroscopic Angle for Delivery:  LAO 0 CAU 0  IMPRESSION: 1. Trileaflet, moderately thickened and calcified aortic valve with severely restricted leaflet opening. No calcifications extending into the LVOT. LVOT is large and makes annular sizing challenging. Annular measurements suitable for a delivery of a 26 mm Edwards-SAPIEN 3 valve.  2.  Sufficient annulus to coronary distance.  3. Optimum Fluoroscopic Angle for Delivery:  LAO 0 CAU 0  4.  No thrombus in the left atrial appendage.  Tobias Alexander   Electronically Signed By: Tobias Alexander On: 06/11/2017 16:08  Narrative EXAM: OVER-READ INTERPRETATION  CT CHEST  The following report is an over-read performed by radiologist Dr. Noe Gens [facility] on 06/11/2017. This over-read does not include interpretation of cardiac or coronary anatomy or pathology. The coronary CTA interpretation by the cardiologist is attached.  COMPARISON:  06/03/2017  FINDINGS: Cardiovascular: Heart is normal size. Extensive calcifications throughout the aorta which is normal caliber. No dissection.  Mediastinum/Nodes: No adenopathy in the visualized lower mediastinum or hila. Calcified left hilar lymph nodes.  Lungs/Pleura: Visualized lungs are clear.  No effusions.  Upper Abdomen: Imaging into the upper abdomen shows no acute findings.  Musculoskeletal: No acute bony abnormality. Chest wall soft tissues unremarkable. Prior median sternotomy and CABG.  IMPRESSION: No acute or  significant extracardiac abnormality.  Electronically Signed: By: Charlett Nose M.D. On: 06/11/2017 14:41    PYP SCAN  MYOCARDIAL AMYLOID PLANAR AND SPECT 07/18/2021  Narrative  The study is normal.  1. H/CL ratio 1.1 at 1 hour and 1.2 at 3 hours. Visually this is Grade 0. 2. Overall, negative for TTR cardiac amyloidosis.         Physical Exam:    VS:  There were no vitals taken for this visit.   Wt Readings from Last 3 Encounters:  10/28/23 163 lb 2.3 oz (74 kg)  09/23/23 165 lb (74.8 kg)  09/01/23 162 lb (73.5 kg)    Gen: no distress, elderly male   Neck: No JVD Cardiac: No Rubs or Gallops, systolic murmur, RRR +2  radial pulses Respiratory: Clear to auscultation bilaterally, normal effort, normal  respiratory rate GI: Soft, nontender, non-distended  MS: trace edema;  moves all extremities Integument: Skin feels warm Neuro:  At time of evaluation, alert and oriented to person/place/time/situation  Psych: Tearful affect, patient feels ok  ASSESSMENT AND PLAN: .    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  He has a new decrease in his LVEF.  His and new infarct on  is planned.  I suspect his PDA is down; he has an upcoming surgery planned. - LDL < 70  Risks and benefits of cardiac catheterization have been discussed with the patient.  These include bleeding, infection, kidney damage, stroke, heart attack, death.  The patient understands these risks and is willing to proceed.  Access recommendations: L radial Procedural considerations He is asymptomatic of HF and infarction; if no high risk findings, will plan for 09/06/2023 surgery with Dr. Lorenso Courier, hold ASA prior.  If he has high risk anatomy and needs intervention, his surgery will need to be cancelled.    Heart Failure Reduced  Ejection Fraction (combined systolic and diastolic) S/p TAVR and HTN - with CKD stage IV - chronic - NYHA class I, Stage C, euvolemic, etiology ischemic - Diuretic regimen: None- he is not aware of a true lasix allergy, continue torsemide 20 mg 3x week and keep his salt restriction - will start succinate 25 mg PO daily - post surgery PA/NP visit for GDMT titration  Dementia- family support - gave resources      Riley Lam, MD FASE [facility] Cardi[facility]  [address] [place], [address] [place], [place] [address] [phone]  8:37 AM

## 2023-11-04 NOTE — Progress Notes (Signed)
   11/04/23 1600  Spiritual Encounters  Type of Visit Initial  Care provided to: Patient  [name] partners present during Programmer, systems;Other (comment)  Referral source Patient request  Reason for visit Advance directives  OnCall Visit No   Ch responded to request for AD. There was no family present at bedside. Patient said that I needed to call his nephew Mellody Dance. Chaplain called Mellody Dance and he said that they had contacted patient's lawyer concerning POA and AD. Lawyer will take care of them. No follow-up needed at this time.

## 2023-11-04 NOTE — Progress Notes (Signed)
PT Cancellation Note  Patient Details Name: SOLACE GALETTI MRN: [id number] DOB: 1934/12/13   Cancelled Treatment:    Reason Eval/Treat Not Completed: Fatigue/lethargy limiting ability to participate (pt sleeping soundly, briefly opened eyes to  verbal/tactile stimulii but promptly went back to sleep. Will follow.)   Tamala Ser PT 11/04/2023  [facility]  Office [phone]

## 2023-11-04 NOTE — Care Management Important Message (Signed)
Important Message  Patient Details  Name: [name] MRN: [id number] Date of Birth: Oct 10, 1934   Important Message Given:  Other (see comment) (copy mailed to address on file)     Corey Harold 11/04/2023, 4:42 PM

## 2023-11-04 NOTE — Progress Notes (Addendum)
Plan was to [facility] this [facility] today however he vomited quite a bit of undigested food prior to discharge and there is concern he may have aspirated given his decreased responsiveness.  Will get a chest x-ray in the AM.  Palliative care has been consulted for further goals of care discussion given his comorbidities and poor prognosis.  Discharge is currently being held and if further improved can likely be discharged in the morning after further goals of care discussion with palliative care medicine.

## 2023-11-04 NOTE — Discharge Summary (Addendum)
Physician Discharge Summary   Patient: [name] MRN: [id number] DOB: Apr 22, 1934  Admit date:     10/28/2023  Discharge date: 11/04/23  Discharge Physician: Marguerita Merles, DO   PCP: [name], MD   Recommendations at discharge:   Follow up up with PCP within 1-2 weeks and repeat CBC, CMP, Mag, Phos within 1 week Have Palliative Care follow up it the outpatient setting Follow up with Urology in the outpatient setting for outpatient cystoscopy to evaluate bladder mass Follow up with Back Surgery at [facility] for Lumbar Wound Incision assessment, care, and follow up  Discharge Diagnoses: Principal Problem:   Acute renal failure superimposed on stage 3a chronic kidney disease (HCC) Active Problems:   Essential hypertension   Diabetes mellitus (HCC)   Transient hypotension   Acute metabolic encephalopathy   Near syncope   Physical deconditioning   Hematuria   AMS (altered mental status)  Resolved Problems:   * No resolved hospital problems. [facility] Course: The Patient 89 year old gentleman history of prostate cancer status post radiation, CKD stage II-3A, renal cell carcinoma status post nephrectomy, bladder cancer, hypertension, hyperlipidemia, CAD status post CABG, type 2 diabetes, chronic HFrEF, history of TAVR, chronic back pain on chronic opiates, gout, GERD recently admitted 09/23/2023-09/30/2023 for surgical wound infection of the back with cellulitis and UTI. Patient also noted to be hypotensive during that hospitalization and treated with midodrine which was discontinued prior to discharge.   Patient presented to the ED with transient hypotension which responded to IV fluids, dysuria, generalized weakness x 1 week. Family also with concerns for confusion over the past few days and near syncopal episode. Patient noted to have had decreased oral intake. Patient seen in the ED, placed on IV fluids, COVID-19 PCR, influenza AMB PCR, RSV PCR negative. Blood cultures obtained.  Urinalysis also obtained as well as urine cultures. Patient also noted to be in acute kidney injury. Head CT negative. Patient given Percocet, vancomycin, cefepime, metronidazole in the ED and patient admitted for further evaluation and management.   **Interim History  Renal function is improved and his confusion is slowly improving.  Blood pressure control remain low and his antihypertensives and diuretics have been held and has been placed on midodrine.  PT OT recommending SNF but his participation has been very limited so he will be better as a long-term care candidate and has a bed available.  His blood pressure is stable and he is stable for discharge and will need to follow-up with PCP, palliative care and neurology in outpatient setting  Assessment and Plan:  AKI on CKD stage II-IIIa, improved significantly and stable -Blood secondary to a prerenal azotemia in the setting of poor oral intake, dehydration and diuretics. -Creatinine on admission noted at 1.5 with baseline 1.0-1.2. -BUN/Cr Trend: Recent Labs  Lab 10/29/23 0604 10/30/23 0708 10/31/23 0636 11/01/23 0527 11/02/23 0506 11/03/23 0519 11/04/23 0530  BUN 28* 18 15 15 16 14 15   CREATININE 1.38* 1.16 1.08 1.11 1.08 1.08 1.08  Urinalysis done with trace leukocytes, nitrite negative, 11-20 WBCs. Urine Cx obtained  -Avoid Nephrotoxic Medications and continue to hold Torsemide, Contrast Dyes, Hypotension and Dehydration to Ensure Adequate Renal Perfusion and will need to Renally Adjust Meds -Patient with cough on 10/30/2023 and due to concerns for volume overload patient given Lasix 20 mg IV x 1 with urine output of 1.5 L.  -Patient also transfused a unit of PRBCs and given Lasix 20 mg IV x 1 posttransfusion on 10/31/2023 with a  urine output of 1.650 L. -Continue to Monitor and Trend Renal Function carefully and repeat CMP within 1 week   Hematuria/Abnormal CT renal stone protocol -Per RN patient with hematuria early on during the  hospitalization which seems to be clearing up.  -Patient with complaints of back pain however does have chronic back pain. -CT renal stone protocol with asymmetric wall thickening of the inferior right bladder worrisome for neoplasm.  Recommend cystoscopy.  Small amount of layering hyperdensity in the bladder worrisome for blood products.  Nonobstructing right renal calculi.  Left renal atrophy with adjacent presumed scarring.  Bilateral renal cysts.  Colonic diverticulosis.  Trace bilateral pleural effusions.   -Patient with prior history of bladder cancer, renal cell carcinoma status post nephrectomy, now with some hematuria and abnormal CT scan concerning for recurrent bladder cancer. -Patient seen in consultation by Urology who are recommending outpatient follow-up for cystoscopy to evaluate bladder mass.  -C/w Supportive care and follow-up with urology in outpatient setting   Acute Metabolic Encephalopathy, improved -Per family patient noted to have some confusion. -Likely multifactorial secondary to dehydration in the setting of chronic opioid use for back pain. -Patient received IV antibiotics in the ED, no obvious infection noted at this time as patient with no fever, no leukocytosis, no meningeal signs. -WBC Trend: Recent Labs  Lab 10/29/23 0604 10/30/23 0708 10/31/23 0636 11/01/23 0527 11/02/23 0506 11/03/23 0519 11/04/23 0530  WBC 9.1 9.7 10.9* 11.3* 10.0 8.6 9.8  -Patient with no respiratory symptoms, chest x-ray negative for any acute infiltrates. -Urinalysis not strongly suggestive of UTI with urine cultures pending. -Patient with no signs of infection of lower back surgical wound site at this time. -CT head negative for any acute abnormalities. -Patient with recent labs a month ago with TSH, vitamin B12, B1 levels within normal limits. -Ammonia levels within normal limits. -Initial blood cultures drawn on admission with MRSE likely contaminant. -Repeat blood cultures x 2  pending with no growth to date.. -Patient improving clinically but slowly. -Patient had some bouts of hypotension could likely be contributing to patient's encephalopathy in the setting of narcotic use. -Continue to minimize narcotics and decreased Oxycodone to 2.5 mg p.o. every 6 hours as needed. -If no significant improvement may need to consider MRI of the brain but is stable. -C/w Supportive care in the outpatient setting   Hypotension -Patient presentation noted to have a transient hypotension which responded to IV fluids. Patient bout of hypotension with systolic blood pressures in the 80s.   -BP fluctuating noted to have been on midodrine before in the past. -Concern for volume overload and as such saline lock IV fluids. -BP borderline, patient given Lasix 20 mg IV x 1 on 10/30/2023  -Patient with hypotension this morning with systolics in the 80s, arousable, noted to have received some oxycodone prior to this this morning. -Decrease oxycodone to 2.5 mg p.o. every 6 hours as needed. -Patient was given IV Albumin 25 q6h x 1 day -Currently remains on Midodrine 10 mg po TID with meals -Continue to hold Toprol-XL, Torsemide.  -Continue to Monitor BP per Protocol  -Last BP reading was on the softer side but maps remained above 65  Near Syncope -As noted per admitting physician (ED report family reported near syncopal episode the day prior to admission likely secondary to dehydration and hypotension. -CT head with no acute abnormalities. -Patient with no acute focal neurological deficits. -Patient not hypoxic with no shortness of breath and low probability for PE. -Clinical improvement with  hydration. -Will place on TED Hose for D/C   Generalized Weakness/Physical Deconditioning -PT/OT recommending SNF but he is more of a LTC candiditate.    Diabetes Mellitus Type 2 -Hemoglobin A1c 6.4 (10/28/2023) -CBG Trend: Recent Labs  Lab 11/02/23 2039 11/03/23 0754 11/03/23 1212  11/03/23 1654 11/03/23 2048 11/04/23 0730 11/04/23 1153  GLUCAP 144* 124* 149* 132* 113* 138* 150*  -Continue to hold oral hypoglycemic agents of Glipizide 5 mg po BID and will not resume at D/C -C/w Sensitive Novolog SSI AC/HS   Chronic Back Pain -Patient noted to be on chronic opioids for a while for his chronic back pain. -Due to concerns opioid may be contributing to patient's altered mental status on presentation and as such patient placed on oxycodone 5 mg every 4 hours as needed pain and reduced to 2.5 mg po q6hprn .   -Continue scheduled Tylenol 1000 mg 3 times daily at facility.  -Patient noted to have a bout of hypotension with systolics in the 80s after received some oxycodone early on this morning. -Continue to Monitor and resume Lidocaine 5% Patch    Chronic HFrEF -2D echo from 08/05/2023 with a EF of 30%, grade 1 diastolic dysfunction, right ventricular systolic function moderately reduced, mild MVR, trivial AVR.  Patient with no signs of significant volume overload on exam however patient with coughing on 11/8-11/9 and felt likely secondary to volume overload.. -Diuretics held on admission but he is Status post Lasix 20 mg IV x 1 10/30/2023 and Lasix 20 mg IV x 1 on 10/31/2023 -Strict I's and O's and Daily Weights  Intake/Output Summary (Last 24 hours) at 11/04/2023 1158 Last data filed at 11/04/2023 8657 Gross per 24 hour  Intake --  Output 901 ml  Net -901 ml  -Saline lock IV fluids. -Continue to Monitor for S/Sx of Volume Overload  Hyponatremia -Na+ Trend: Recent Labs  Lab 10/29/23 0604 10/30/23 0708 10/31/23 0636 11/01/23 0527 11/02/23 0506 11/03/23 0519 11/04/23 0530  NA 138 141 135 133* 135 134* 132*  -Continue to Monitor and Trend and repeat CMP in the AM   Normocytic Anemia -Patient noted with some hematuria per RN. -Hgb/Hct Trend: Recent Labs  Lab 10/30/23 0708 10/31/23 0636 10/31/23 1835 11/01/23 0527 11/02/23 0506 11/03/23 0519  11/04/23 0530  HGB 7.8* 7.9* 9.5* 9.6* 9.3* 8.2* 9.5*  HCT 25.0* 26.0* 30.4* 29.7* 29.5* 26.6* 30.5*  MCV 97.7 98.1  --  94.9 97.0 96.7 96.2  -Anemia panel with iron of 32, TIBC of 237, ferritin of 429, folate of 20.4, vitamin B12 of 385. -Status post transfusion 1 unit PRBCs.  -Continue to Monitor for S/Sx of Bleeding -Repeat CBC in the AM    GERD/GI Prophylaxis  -C/w Famotidine 40 mg po Daily    CAD status post CABG -Stable. -Continue to hold home regimen aspirin in light of concern for hematuria. -Due to transient hypotension on admission continue to hold Metoprolol Succinate 25 mg po Daily and Torsemide 20 mg po MWFor now. -If necessary will resume home NTG SL 0.4 mg po q29min  prn CP  Hypoalbuminemia -Patient's Albumin Trend: Recent Labs  Lab 10/28/23 1227 11/02/23 0506 11/04/23 0530  ALBUMIN 2.4* 2.2* 3.1*  -Continue to Monitor and Trend and repeat CMP in the AM  Back Lumbar Wound -Had Surgery at [facility] 9/16/ and seen at [facility] 10/1 -WOC consulted and recommending "lean back wound with NS, apply Silver Hydrofiber (Aquacel Coralee North [phone]) cut to fit wound bed daily,  use a  Q tip applicator to insert into wound bed leaving a tail of silver hanging out of wound for easy removal.   Cover with dry gauze and silicone foam. May lift foam daily to replace silver.  Change foam q3 days and prn soiling. Moisten silver with NS if stuck to wound bed for atraumatic removal." -Will need to follow up with Back Surgeon at [facility] after discharge for continued assessment and care of this incision Consultants: Urology Procedures performed: As delineated as above  Disposition: Long term care facility  Diet recommendation:  Discharge Diet Orders (From admission, onward)     Start     Ordered   11/04/23 0000  Diet - low sodium heart healthy       Comments: Dysphagia 3 Diet with Thin Liquid   11/04/23 1155           Cardiac and Carb modified diet - DYSPHAGIA 3  Diet with Thin Liquid  DISCHARGE MEDICATION: Allergies as of 11/04/2023       Reactions   Fish Allergy Swelling   Dark fish- salmon, tuna    Lipitor [atorvastatin] Other (See Comments)   Muscle pain   Penicillins Swelling   SWELLING OF THE FEET Has patient had a PCN reaction causing immediate rash, facial/tongue/throat swelling, SOB or lightheadedness with hypotension: no Has patient had a PCN reaction causing severe rash involving mucus membranes or skin necrosis: no Has patient had a PCN reaction that required hospitalization no Has patient had a PCN reaction occurring within the last 10 years: no If all of the above answers are "NO", then may proceed with Cephalosporin use.   Simvastatin Other (See Comments)   MUSCLE PAIN   Adacel [tetanus-diphth-acell Pertussis]    UNSPECIFIED REACTION    Lasix [furosemide] Swelling   SWELLING REACTION UNSPECIFIED    Septra [sulfamethoxazole-trimethoprim]    UNSPECIFIED REACTION    Flexeril [cyclobenzaprine] Other (See Comments)   Causes confusion   Pantoprazole Diarrhea        Medication List     STOP taking these medications    glipiZIDE 10 MG tablet Commonly known as: GLUCOTROL   HYDROcodone-acetaminophen 10-325 MG tablet Commonly known as: NORCO   melatonin 5 MG Tabs   metoprolol succinate 25 MG 24 hr tablet Commonly known as: Toprol XL   torsemide 20 MG tablet Commonly known as: DEMADEX       TAKE these medications    acetaminophen 500 MG tablet Commonly known as: TYLENOL Take 2 tablets (1,000 mg total) by mouth 3 (three) times daily.   aspirin 81 MG chewable tablet Chew 81 mg by mouth every evening.   DULoxetine 20 MG capsule Commonly known as: CYMBALTA Take 20 mg by mouth 2 (two) times daily.   nutrition supplement (JUVEN) Pack Take 1 packet by mouth 2 (two) times daily between meals.   Ensure Max Protein Liqd Take 330 mLs (11 oz total) by mouth daily.   famotidine 20 MG tablet Commonly known as:  PEPCID Take 1 tablet (20 mg total) by mouth daily. Start taking on: November 05, 2023   gabapentin 100 MG capsule Commonly known as: NEURONTIN Take 100 mg by mouth every 6 (six) hours as needed (nerve pain).   lidocaine 5 % Commonly known as: LIDODERM Place 1 patch onto the skin as needed. Remove & Discard patch within 12 hours or as directed by MD   midodrine 10 MG tablet Commonly known as: PROAMATINE Take 1 tablet (10 mg total) by mouth 3 (three) times daily  with meals.   multivitamin tablet Take 1 tablet by mouth at bedtime.   nitroGLYCERIN 0.4 MG SL tablet Commonly known as: NITROSTAT Place 1 tablet (0.4 mg total) under the tongue every 5 (five) minutes as needed for chest pain.   nystatin cream Commonly known as: MYCOSTATIN Apply 1 Application topically 3 (three) times daily.   ondansetron 4 MG tablet Commonly known as: ZOFRAN Take 4 mg by mouth every 6 (six) hours as needed for nausea or vomiting.   oxyCODONE 5 MG immediate release tablet Commonly known as: Oxy IR/ROXICODONE Take 0.5 tablets (2.5 mg total) by mouth every 6 (six) hours as needed for up to 3 days for moderate pain (pain score 4-6).   polyethylene glycol 17 g packet Commonly known as: MIRALAX / GLYCOLAX Take 17 g by mouth daily as needed for mild constipation.   psyllium 95 % Pack Commonly known as: HYDROCIL/METAMUCIL Take 1 packet by mouth daily as needed for mild constipation.   ROLAIDS PO Take by mouth as needed (reflux).   Systane Complete 0.6 % Soln Generic drug: Propylene Glycol Place 1 drop into the left eye 2 (two) times daily.   tamsulosin 0.4 MG Caps capsule Commonly known as: FLOMAX Take 0.4 mg by mouth 2 (two) times daily.   Vitamin D-3 125 MCG (5000 UT) Tabs Take 5,000 Units by mouth daily.               Discharge Care Instructions  (From admission, onward)           Start     Ordered   11/04/23 0000  Discharge wound care:       Comments: Clean back wound with  NS, apply Silver Hydrofiber (Aquacel Coralee North [phone]) cut to fit wound bed daily,  use a Q tip applicator to insert into wound bed leaving a tail of silver hanging out of wound for easy removal.   Cover with dry gauze and silicone foam. May lift foam daily to replace silver.  Change foam q3 days and prn soiling. Moisten silver with NS if stuck to wound bed for atraumatic removal.   11/04/23 1155            Discharge Exam: Filed Weights   10/28/23 1247  Weight: 74 kg   Vitals:   11/04/23 1107 11/04/23 1157  BP: (!) 109/59 (!) 91/57  Pulse: (!) 102 (!) 108  Resp: 18 18  Temp: 98.2 F (36.8 C) 98.5 F (36.9 C)  SpO2: 96% 96%   Examination: Physical Exam:  Constitutional: Elderly chronically ill-appearing overweight Caucasian male in no acute distress was resting and okay from his sleep Respiratory: Diminished to auscultation bilaterally, no wheezing, rales, rhonchi or crackles. Normal respiratory effort and patient is not tachypenic. No accessory muscle use.  Unlabored breathing Cardiovascular: RRR, no murmurs / rubs / gallops. S1 and S2 auscultated.  Trace extremity edema. .  Abdomen: Soft, non-tender, slightly distended secondary to body habitus. Bowel sounds positive.  GU: Deferred. Musculoskeletal: No clubbing / cyanosis of digits/nails. No joint deformity upper and lower extremities Neurologic: A little somnolent and drowsy but easily arousable and Psychiatric: Was drowsy but when he is awoken and he is oriented x 2  Condition at discharge: stable  The results of significant diagnostics from this hospitalization (including imaging, microbiology, ancillary and laboratory) are listed below for reference.   Imaging Studies: CT RENAL STONE STUDY  Result Date: 10/30/2023 CLINICAL DATA:  Abdominal and flank pain.  Hematuria. EXAM: CT ABDOMEN AND  PELVIS WITHOUT CONTRAST TECHNIQUE: Multidetector CT imaging of the abdomen and pelvis was performed following the standard protocol  without IV contrast. RADIATION DOSE REDUCTION: This exam was performed according to the departmental dose-optimization program which includes automated exposure control, adjustment of the mA and/or kV according to patient size and/or use of iterative reconstruction technique. COMPARISON:  None Available. FINDINGS: Lower chest: There are trace bilateral pleural effusions. There is a stable 5 mm nodular density in the right lower lobe image 7/26. Hepatobiliary: No focal liver abnormality is seen. No gallstones, gallbladder wall thickening, or biliary dilatation. Pancreas: Unremarkable. No pancreatic ductal dilatation or surrounding inflammatory changes. Spleen: Normal in size without focal abnormality. Adrenals/Urinary Tract: There is a small amount of layering hyperdensity in the bladder. There is asymmetric wall thickening of the inferior right bladder measuring up to 15 mm. There is left renal atrophy with some adjacent presumed scarring laterally. Left renal cyst measuring 10 mm is unchanged. There is no hydronephrosis. There are punctate calculi in the right kidney measuring up to 5 mm. There is an exophytic cyst in the inferior pole measuring 4.6 cm. There is no hydronephrosis. The adrenal glands are within normal limits. Stomach/Bowel: Stomach is within normal limits. Appendix a is not seen ppears normal. No evidence of bowel wall thickening, distention, or inflammatory changes. There is sigmoid and descending colon diverticulosis. Vascular/Lymphatic: Aortic atherosclerosis. No enlarged abdominal or pelvic lymph nodes. Reproductive: Prostate radiotherapy seeds are present. Other: There is mild stranding and minimal fluid in the pelvis, nonspecific. There is a small fat containing umbilical hernia. Musculoskeletal: The bones are osteopenic. L1-L4 posterior fusion hardware is present. IMPRESSION: 1. Asymmetric wall thickening of the inferior right bladder worrisome for neoplasm. Recommend cystoscopy. 2. Small  amount of layering hyperdensity in the bladder worrisome for blood products. 3. Nonobstructing right renal calculi. 4. Left renal atrophy with adjacent presumed scarring. 5. Bilateral renal cysts. No follow-up imaging recommended. 6. Colonic diverticulosis. 7. Trace bilateral pleural effusions. 8. Stable 5 mm nodular density in the right lower lobe. 9. Mild stranding and minimal fluid in the pelvis, nonspecific. Aortic Atherosclerosis (ICD10-I70.0). Electronically Signed   By: Darliss Cheney M.D.   On: 10/30/2023 20:28   DG CHEST PORT 1 VIEW  Result Date: 10/30/2023 CLINICAL DATA:  Cough EXAM: PORTABLE CHEST 1 VIEW COMPARISON:  10/28/2023 FINDINGS: Cardiomegaly status post median sternotomy and CABG. Mild diffuse interstitial opacity. Focal airspace opacity. No acute osseous findings. IMPRESSION: Cardiomegaly with mild diffuse interstitial opacity, likely edema. No focal airspace opacity. Electronically Signed   By: Jearld Lesch M.D.   On: 10/30/2023 17:45   CT Head Wo Contrast  Result Date: 10/28/2023 CLINICAL DATA:  Hypotension, weakness EXAM: CT HEAD WITHOUT CONTRAST TECHNIQUE: Contiguous axial images were obtained from the base of the skull through the vertex without intravenous contrast. RADIATION DOSE REDUCTION: This exam was performed according to the departmental dose-optimization program which includes automated exposure control, adjustment of the mA and/or kV according to patient size and/or use of iterative reconstruction technique. COMPARISON:  09/28/2023 FINDINGS: Brain: No evidence of acute infarction, hemorrhage, mass, mass effect, or midline shift. No hydrocephalus or extra-axial fluid collection. Age related cerebral atrophy Vascular: No hyperdense vessel. Atherosclerotic calcifications in the intracranial carotid and vertebral arteries. Skull: Negative for fracture or focal lesion. Sinuses/Orbits: No acute finding. Other: The mastoid air cells are well aerated. IMPRESSION: No acute  intracranial process. Electronically Signed   By: Wiliam Ke M.D.   On: 10/28/2023 19:36   DG Chest  Port 1 View  Result Date: 10/28/2023 CLINICAL DATA:  Possible sepsis.  Hypotension. EXAM: PORTABLE CHEST 1 VIEW COMPARISON:  September 23, 2023. FINDINGS: Stable cardiomediastinal silhouette. 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 Hypoinflation of the lungs is noted. Lungs are clear. Bony thorax is unremarkable. IMPRESSION: Hypoinflation of the lungs.  No acute abnormality seen. Electronically Signed   By: Lupita Raider M.D.   On: 10/28/2023 14:46    Microbiology: Results for orders placed or performed during the hospital encounter of 10/28/23  Resp panel by RT-PCR (RSV, Flu A&B, Covid) Anterior Nasal Swab     Status: None   Collection Time: 10/28/23 12:45 PM   Specimen: Anterior Nasal Swab  Result Value Ref Range Status   SARS Coronavirus 2 by RT PCR NEGATIVE NEGATIVE Final    Comment: (NOTE) SARS-CoV-2 target nucleic acids are NOT DETECTED.  The SARS-CoV-2 RNA is generally detectable in upper respiratory specimens during the acute phase of infection. The lowest concentration of SARS-CoV-2 viral copies this assay can detect is 138 copies/mL. A negative result does not preclude SARS-Cov-2 infection and should not be used as the sole basis for treatment or other patient management decisions. A negative result may occur with  improper specimen collection/handling, submission of specimen other than nasopharyngeal swab, presence of viral mutation(s) within the areas targeted by this assay, and inadequate number of viral copies(<138 copies/mL). A negative result must be combined with clinical observations, patient history, and epidemiological information. The expected result is Negative.  Fact Sheet for Patients:  BloggerCourse.com  Fact Sheet for Healthcare Providers:  SeriousBroker.it  This test  is no t yet approved or cleared by the [place] FDA and  has been authorized for detection and/or diagnosis of SARS-CoV-2 by FDA under an Emergency Use Authorization (EUA). This EUA will remain  in effect (meaning this test can be used) for the duration of the COVID-19 declaration under Section 564(b)(1) of the Act, 21 U.S.C.section 360bbb-3(b)(1), unless the authorization is terminated  or revoked sooner.       Influenza A by PCR NEGATIVE NEGATIVE Final   Influenza B by PCR NEGATIVE NEGATIVE Final    Comment: (NOTE) The Xpert Xpress SARS-CoV-2/FLU/RSV plus assay is intended as an aid in the diagnosis of influenza from Nasopharyngeal swab specimens and should not be used as a sole basis for treatment. Nasal washings and aspirates are unacceptable for Xpert Xpress SARS-CoV-2/FLU/RSV testing.  Fact Sheet for Patients: BloggerCourse.com  Fact Sheet for Healthcare Providers: SeriousBroker.it  This test is not yet approved or cleared by the [place] FDA and has been authorized for detection and/or diagnosis of SARS-CoV-2 by FDA under an Emergency Use Authorization (EUA). This EUA will remain in effect (meaning this test can be used) for the duration of the COVID-19 declaration under Section 564(b)(1) of the Act, 21 U.S.C. section 360bbb-3(b)(1), unless the authorization is terminated or revoked.     Resp Syncytial Virus by PCR NEGATIVE NEGATIVE Final    Comment: (NOTE) Fact Sheet for Patients: BloggerCourse.com  Fact Sheet for Healthcare Providers: SeriousBroker.it  This test is not yet approved or cleared by the [place] FDA and has been authorized for detection and/or diagnosis of SARS-CoV-2 by FDA under an Emergency Use Authorization (EUA). This EUA will remain in effect (meaning this test can be used) for the duration of the COVID-19 declaration under Section  564(b)(1) of the Act, 21 U.S.C. section 360bbb-3(b)(1), unless the authorization is terminated or revoked.  Performed  at [facility], [address] [place], [place] [address]   Blood Culture (routine x 2)     Status: Abnormal   Collection Time: 10/28/23 12:50 PM   Specimen: BLOOD  Result Value Ref Range Status   Specimen Description   Final    BLOOD SITE NOT SPECIFIED Performed at [facility], [address] [place], [place] [address]    Special Requests   Final    BOTTLES DRAWN AEROBIC AND ANAEROBIC Blood Culture results may not be optimal due to an inadequate volume of blood received in culture bottles Performed at [facility], [address], [place], [place] [address]    Culture  Setup Time   Final    GRAM POSITIVE COCCI BOTTLES DRAWN AEROBIC ONLY CRITICAL RESULT CALLED TO, READ BACK BY AND VERIFIED WITH: M LILLISTON,PHARMD@0011  10/30/23 MK    Culture (A)  Final    STAPHYLOCOCCUS EPIDERMIDIS THE SIGNIFICANCE OF ISOLATING THIS ORGANISM FROM A SINGLE SET OF BLOOD CULTURES WHEN MULTIPLE SETS ARE DRAWN IS UNCERTAIN. PLEASE NOTIFY THE MICROBIOLOGY DEPARTMENT WITHIN ONE WEEK IF SPECIATION AND SENSITIVITIES ARE REQUIRED. Performed at [facility], [address] [place], [place] [address]    Report Status 11/01/2023 FINAL  Final  Blood Culture ID Panel (Reflexed)     Status: Abnormal   Collection Time: 10/28/23 12:50 PM  Result Value Ref Range Status   Enterococcus faecalis NOT DETECTED NOT DETECTED Final   Enterococcus Faecium NOT DETECTED NOT DETECTED Final   Listeria monocytogenes NOT DETECTED NOT DETECTED Final   Staphylococcus species DETECTED (A) NOT DETECTED Final    Comment: CRITICAL RESULT CALLED TO, READ BACK BY AND VERIFIED WITH: M LILLISTON,PHARMD@0011  10/30/23 MK    Staphylococcus aureus (BCID) NOT DETECTED NOT DETECTED Final   Staphylococcus epidermidis DETECTED (A) NOT DETECTED Final     Comment: Methicillin (oxacillin) resistant coagulase negative staphylococcus. Possible blood culture contaminant (unless isolated from more than one blood culture draw or clinical case suggests pathogenicity). No antibiotic treatment is indicated for blood  culture contaminants. CRITICAL RESULT CALLED TO, READ BACK BY AND VERIFIED WITH: M LILLISTON,PHARMD@0011  10/30/23 MK    Staphylococcus lugdunensis NOT DETECTED NOT DETECTED Final   Streptococcus species NOT DETECTED NOT DETECTED Final   Streptococcus agalactiae NOT DETECTED NOT DETECTED Final   Streptococcus pneumoniae NOT DETECTED NOT DETECTED Final   Streptococcus pyogenes NOT DETECTED NOT DETECTED Final   A.calcoaceticus-baumannii NOT DETECTED NOT DETECTED Final   Bacteroides fragilis NOT DETECTED NOT DETECTED Final   Enterobacterales NOT DETECTED NOT DETECTED Final   Enterobacter cloacae complex NOT DETECTED NOT DETECTED Final   Escherichia coli NOT DETECTED NOT DETECTED Final   Klebsiella aerogenes NOT DETECTED NOT DETECTED Final   Klebsiella oxytoca NOT DETECTED NOT DETECTED Final   Klebsiella pneumoniae NOT DETECTED NOT DETECTED Final   Proteus species NOT DETECTED NOT DETECTED Final   Salmonella species NOT DETECTED NOT DETECTED Final   Serratia marcescens NOT DETECTED NOT DETECTED Final   Haemophilus influenzae NOT DETECTED NOT DETECTED Final   Neisseria meningitidis NOT DETECTED NOT DETECTED Final   Pseudomonas aeruginosa NOT DETECTED NOT DETECTED Final   Stenotrophomonas maltophilia NOT DETECTED NOT DETECTED Final   Candida albicans NOT DETECTED NOT DETECTED Final   Candida auris NOT DETECTED NOT DETECTED Final   Candida glabrata NOT DETECTED NOT DETECTED Final   Candida krusei NOT DETECTED NOT DETECTED Final   Candida parapsilosis NOT DETECTED NOT DETECTED Final   Candida tropicalis NOT  DETECTED NOT DETECTED Final   Cryptococcus neoformans/gattii NOT DETECTED NOT DETECTED Final   Methicillin resistance mecA/C DETECTED  (A) NOT DETECTED Final    Comment: CRITICAL RESULT CALLED TO, READ BACK BY AND VERIFIED WITH: M LILLISTON,PHARMD@0011  10/30/23 MK Performed at [facility], [address], [place], [place] [address]   Blood Culture (routine x 2)     Status: None   Collection Time: 10/28/23  1:00 PM   Specimen: BLOOD  Result Value Ref Range Status   Specimen Description   Final    BLOOD SITE NOT SPECIFIED Performed at [facility], [address] [place], [place] [address]    Special Requests   Final    BOTTLES DRAWN AEROBIC AND ANAEROBIC Blood Culture adequate volume Performed at [facility], [address] [place], [place] [address]    Culture   Final    NO GROWTH 5 DAYS Performed at [facility], [address], [place], [place] [address]    Report Status 11/02/2023 FINAL  Final  Urine Culture     Status: Abnormal   Collection Time: 10/28/23  2:39 PM   Specimen: Urine, Random  Result Value Ref Range Status   Specimen Description   Final    URINE, RANDOM Performed at [facility], [address], [place], [place] [address]    Special Requests   Final    NONE Reflexed from [id number] Performed at [facility], [address], [place], [place] [address]    Culture (A)  Final    <10,000 COLONIES/mL INSIGNIFICANT GROWTH Performed at [facility], [address] [place], [place] [address]    Report Status 10/29/2023 FINAL  Final  Urine Culture (for pregnant, neutropenic or urologic patients or patients with an indwelling urinary catheter)     Status: None   Collection Time: 10/29/23 12:03 PM   Specimen: Urine, Clean Catch  Result Value Ref Range Status   Specimen Description   Final    URINE, CLEAN CATCH Performed at [facility], [address] [place], [place] [address]    Special Requests   Final    NONE Performed at [facility], [address],  [place], [place] [address]    Culture   Final    NO GROWTH Performed at [facility], [address] [place], [place] [address]    Report Status 10/30/2023 FINAL  Final  Culture, blood (Routine X 2) w Reflex to ID Panel     Status: None   Collection Time: 10/30/23 12:16 PM   Specimen: BLOOD  Result Value Ref Range Status   Specimen Description   Final    BLOOD BLOOD RIGHT ARM Performed at [facility], [address], [place], [place] [address]    Special Requests   Final    BOTTLES DRAWN AEROBIC AND ANAEROBIC Blood Culture adequate volume Performed at [facility], [address], [place], [place] [address]    Culture   Final    NO GROWTH 5 DAYS Performed at [facility], [address] [place], [place] [address]    Report Status 11/04/2023 FINAL  Final  Culture, blood (Routine X 2) w Reflex to ID Panel     Status: None   Collection Time: 10/30/23 12:19 PM   Specimen: BLOOD  Result Value Ref Range Status   Specimen Description   Final  BLOOD BLOOD LEFT HAND Performed at [facility], [address], [place], [place] [address]    Special Requests   Final    BOTTLES DRAWN AEROBIC AND ANAEROBIC Blood Culture adequate volume Performed at [facility], [address], [place], [place] [address]    Culture   Final    NO GROWTH 5 DAYS Performed at [facility], [address], [place], [place] [address]    Report Status 11/04/2023 FINAL  Final   Labs: CBC: Recent Labs  Lab 10/28/23 1227 10/29/23 0604 10/30/23 0708 10/31/23 0636 10/31/23 1835 11/01/23 0527 11/02/23 0506 11/03/23 0519 11/04/23 0530  WBC 10.0   < > 9.7 10.9*  --  11.3* 10.0 8.6 9.8  NEUTROABS 6.6  --  6.4 7.3  --   --  6.1  --  6.5  HGB 8.4*   < > 7.8* 7.9* 9.5* 9.6* 9.3* 8.2* 9.5*  HCT 27.3*   < > 25.0* 26.0* 30.4* 29.7* 29.5* 26.6* 30.5*  MCV 98.2   < > 97.7 98.1  --  94.9 97.0 96.7 96.2  PLT 328   < > 309  314  --  322 325 287 321   < > = values in this interval not displayed.   Basic Metabolic Panel: Recent Labs  Lab 10/29/23 0604 10/30/23 0708 10/31/23 0636 11/01/23 0527 11/02/23 0506 11/03/23 0519 11/04/23 0530  NA 138   < > 135 133* 135 134* 132*  K 3.6   < > 3.7 3.5 3.6 3.8 3.9  CL 104   < > 103 99 100 100 98  CO2 25   < > 24 24 25 26 26   GLUCOSE 125*   < > 122* 128* 116* 114* 135*  BUN 28*   < > 15 15 16 14 15   CREATININE 1.38*   < > 1.08 1.11 1.08 1.08 1.08  CALCIUM 10.6*   < > 10.2 9.9 10.1 10.2 10.1  MG 2.0  --   --   --   --   --  1.9  PHOS  --   --   --   --  2.3*  --  2.6   < > = values in this interval not displayed.   Liver Function Tests: Recent Labs  Lab 10/28/23 1227 11/02/23 0506 11/04/23 0530  AST 16  --  22  ALT 8  --  12  ALKPHOS 49  --  45  BILITOT 0.4  --  0.9  PROT 6.6  --  6.3*  ALBUMIN 2.4* 2.2* 3.1*   CBG: Recent Labs  Lab 11/03/23 1212 11/03/23 1654 11/03/23 2048 11/04/23 0730 11/04/23 1153  GLUCAP 149* 132* 113* 138* 150*    Discharge time spent: greater than 30 minutes.  Signed: Marguerita Merles, DO [facility] 11/04/2023

## 2023-11-04 NOTE — Progress Notes (Signed)
Physical Therapy Treatment Patient Details Name: [name] MRN: [id number] DOB: 03/15/1934 Today's Date: 11/04/2023   History of Present Illness 89 y.o. male admitted with acute metabolic encephalopathy, AKI, transient hypotension and near syncope event. PMH: prostate cancer status post radiation, CKD stage II-IIIa, renal cell carcinoma status post nephrectomy, bladder cancer, hypertension, hyperlipidemia, CAD status post CABG, type 2 diabetes, chronic HFrEF, history of TAVR, chronic back pain on opiate, gout, GERD.  Admittion 10/3-10/10 for surgical wound infection of his back with cellulitis and also UTI.    PT Comments  Min assist for bed mobility, mod assist for sit to stand. Pt was able to take a few shuffling, pivotal steps to recliner with RW. Pt performed seated BUE/LE strengthening exercises. Pt oriented to self only. Patient will benefit from continued inpatient follow up therapy, <3 hours/day.     If plan is discharge home, recommend the following: A lot of help with walking and/or transfers;A lot of help with bathing/dressing/bathroom;Assistance with cooking/housework;Assist for transportation;Help with stairs or ramp for entrance;Direct supervision/assist for financial management;Direct supervision/assist for medications management   Can travel by private vehicle     No  Equipment Recommendations  Wheelchair cushion (measurements PT);Wheelchair (measurements PT)    Recommendations for Other Services       Precautions / Restrictions Precautions Precautions: Fall Precaution Comments: back surgery 08/2023 at [facility] Required Braces or Orthoses:  (none) Restrictions Weight Bearing Restrictions: No     Mobility  Bed Mobility Overal bed mobility: Needs Assistance Bed Mobility: Sidelying to Sit, Rolling Rolling: Min assist Sidelying to sit: Min assist       General bed mobility comments: increased time and cues for sequencing, assist to raise trunk     Transfers Overall transfer level: Needs assistance Equipment used: Rolling walker (2 wheels) Transfers: Sit to/from Stand, Bed to chair/wheelchair/BSC Sit to Stand: Mod assist, +2 safety/equipment, From elevated surface, +2 physical assistance   Step pivot transfers: Min assist, +2 safety/equipment, +2 physical assistance       General transfer comment: Increased time, step pivot to chair with assist for balance and weight shift; VCs hand placement    Ambulation/Gait                   Stairs             Wheelchair Mobility     Tilt Bed    Modified Rankin (Stroke Patients Only)       Balance Overall balance assessment: Needs assistance Sitting-balance support: Feet supported, No upper extremity supported Sitting balance-Leahy Scale: Fair     Standing balance support: Bilateral upper extremity supported, During functional activity, Reliant on assistive device for balance Standing balance-Leahy Scale: Poor Standing balance comment: Needs RW and min A                            Cognition Arousal: Alert, Lethargic Behavior During Therapy: Flat affect Overall Cognitive Status: No family/caregiver present to determine baseline cognitive functioning                   Orientation Level: Disoriented to, Time, Situation, Place   Memory: Decreased short-term memory   Safety/Judgement: Decreased awareness of safety Awareness: Emergent   General Comments: Slow to respond        Exercises General Exercises - Lower Extremity Ankle Circles/Pumps: AAROM, Both, 10 reps, Seated Long Arc Quad: AROM, Both, 10 reps, Seated Shoulder Exercises Shoulder Flexion: AROM, Both,  10 reps, Seated    General Comments        Pertinent Vitals/Pain Pain Assessment Pain Assessment: No/denies pain Pain Score: 0-No pain Faces Pain Scale: No hurt    Home Living                          Prior Function            PT Goals (current  goals can now be found in the care plan section) Acute Rehab PT Goals Patient Stated Goal: decrease pain PT Goal Formulation: With patient Time For Goal Achievement: 11/12/23 Potential to Achieve Goals: Fair Progress towards PT goals: Progressing toward goals    Frequency    Min 1X/week      PT Plan      Co-evaluation              AM-PAC PT "6 Clicks" Mobility   Outcome Measure  Help needed turning from your back to your side while in a flat bed without using bedrails?: A Little Help needed moving from lying on your back to sitting on the side of a flat bed without using bedrails?: A Lot Help needed moving to and from a bed to a chair (including a wheelchair)?: A Little Help needed standing up from a chair using your arms (e.g., wheelchair or bedside chair)?: A Lot Help needed to walk in hospital room?: Total Help needed climbing 3-5 steps with a railing? : Total 6 Click Score: 12    End of Session Equipment Utilized During Treatment: Gait belt   Patient left: with chair alarm set;in chair;with call bell/phone within reach Nurse Communication: Mobility status PT Visit Diagnosis: Difficulty in walking, not elsewhere classified (R26.2);Pain     Time: 3086-5784 PT Time Calculation (min) (ACUTE ONLY): 13 min  Charges:    $Therapeutic Activity: 8-22 mins PT General Charges $$ ACUTE PT VISIT: 1 Visit                     Tamala Ser PT 11/04/2023  Acute Rehabilitation Services  Office [phone]

## 2023-11-04 NOTE — TOC Progression Note (Signed)
Transition of Care [facility]) - Progression Note    Patient Details  Name: [name] MRN: [id number] Date of Birth: 1934-04-02  [facility] CM/SW Contact  [name], LCSW Phone Number: 11/04/2023, 9:45 AM  Clinical Narrative:    Pt's insurance is offering a peer to peer for SNF. Per  Tammy w/ [facility] they feel pt is more custodial/palliative due to wound and not participating in therapy in the hospital. Peer to peer is due by 3pm. Call in to speak with Dr Lovena Le at [phone]. MD notified.    Expected Discharge Plan: Skilled Nursing Facility Barriers to Discharge: No Barriers Identified  Expected Discharge Plan and Services In-house Referral: Clinical Social Work   Post Acute Care Choice: Skilled Nursing Facility, Resumption of Svcs/PTA Provider Living arrangements for the past 2 months: Single Family Home                 DME Arranged: N/A DME Agency: NA                   Social Determinants of Health (SDOH) Interventions SDOH Screenings   Food Insecurity: Patient Unable To Answer (10/28/2023)  Housing: High Risk (10/28/2023)  Transportation Needs: Patient Unable To Answer (10/28/2023)  Utilities: Patient Unable To Answer (10/28/2023)  Social Connections: Unknown (05/05/2022)   Received from [facility]  Stress: No Stress Concern Present (09/06/2023)   Received from [facility]  Tobacco Use: Medium Risk (10/28/2023)    Readmission Risk Interventions    11/03/2023    1:08 PM  Readmission Risk Prevention Plan  Transportation Screening Complete  PCP or Specialist Appt within 5-7 Days Complete  Home Care Screening Complete  Medication Review (RN CM) Complete

## 2023-11-04 NOTE — Progress Notes (Signed)
While feeding pt, pt vomited moderate amount of undigested food. Dr Marland Mcalpine made aware

## 2023-11-05 ENCOUNTER — Inpatient Hospital Stay (HOSPITAL_COMMUNITY): Payer: PPO

## 2023-11-05 DIAGNOSIS — R55 Syncope and collapse: Secondary | ICD-10-CM | POA: Diagnosis not present

## 2023-11-05 DIAGNOSIS — N179 Acute kidney failure, unspecified: Secondary | ICD-10-CM | POA: Diagnosis not present

## 2023-11-05 DIAGNOSIS — E1159 Type 2 diabetes mellitus with other circulatory complications: Secondary | ICD-10-CM | POA: Diagnosis not present

## 2023-11-05 DIAGNOSIS — G9341 Metabolic encephalopathy: Secondary | ICD-10-CM | POA: Diagnosis not present

## 2023-11-05 LAB — CBC WITH DIFFERENTIAL/PLATELET
Abs Immature Granulocytes: 0.03 10*3/uL (ref 0.00–0.07)
Basophils Absolute: 0.1 10*3/uL (ref 0.0–0.1)
Basophils Relative: 1 %
Eosinophils Absolute: 0.3 10*3/uL (ref 0.0–0.5)
Eosinophils Relative: 3 %
HCT: 29.4 % — ABNORMAL LOW (ref 39.0–52.0)
Hemoglobin: 9.1 g/dL — ABNORMAL LOW (ref 13.0–17.0)
Immature Granulocytes: 0 %
Lymphocytes Relative: 22 %
Lymphs Abs: 1.9 10*3/uL (ref 0.7–4.0)
MCH: 29.9 pg (ref 26.0–34.0)
MCHC: 31 g/dL (ref 30.0–36.0)
MCV: 96.7 fL (ref 80.0–100.0)
Monocytes Absolute: 0.7 10*3/uL (ref 0.1–1.0)
Monocytes Relative: 8 %
Neutro Abs: 5.7 10*3/uL (ref 1.7–7.7)
Neutrophils Relative %: 66 %
Platelets: 316 10*3/uL (ref 150–400)
RBC: 3.04 MIL/uL — ABNORMAL LOW (ref 4.22–5.81)
RDW: 14.7 % (ref 11.5–15.5)
WBC: 8.7 10*3/uL (ref 4.0–10.5)
nRBC: 0 % (ref 0.0–0.2)

## 2023-11-05 LAB — COMPREHENSIVE METABOLIC PANEL
ALT: 11 U/L (ref 0–44)
AST: 20 U/L (ref 15–41)
Albumin: 2.8 g/dL — ABNORMAL LOW (ref 3.5–5.0)
Alkaline Phosphatase: 43 U/L (ref 38–126)
Anion gap: 9 (ref 5–15)
BUN: 16 mg/dL (ref 8–23)
CO2: 25 mmol/L (ref 22–32)
Calcium: 9.9 mg/dL (ref 8.9–10.3)
Chloride: 98 mmol/L (ref 98–111)
Creatinine, Ser: 1.06 mg/dL (ref 0.61–1.24)
GFR, Estimated: >60 mL/min (ref >60)
Glucose, Bld: 100 mg/dL — ABNORMAL HIGH (ref 70–99)
Potassium: 3.4 mmol/L — ABNORMAL LOW (ref 3.5–5.1)
Sodium: 132 mmol/L — ABNORMAL LOW (ref 135–145)
Total Bilirubin: 1 mg/dL (ref <1.2)
Total Protein: 6.1 g/dL — ABNORMAL LOW (ref 6.5–8.1)

## 2023-11-05 LAB — GLUCOSE, CAPILLARY: Glucose-Capillary: 101 mg/dL — ABNORMAL HIGH (ref 70–99)

## 2023-11-05 LAB — MAGNESIUM: Magnesium: 1.9 mg/dL (ref 1.7–2.4)

## 2023-11-05 LAB — PHOSPHORUS: Phosphorus: 3.4 mg/dL (ref 2.5–4.6)

## 2023-11-05 MED ORDER — DOXYCYCLINE HYCLATE 100 MG PO TABS: 100.0000 mg | ORAL_TABLET | Freq: Two times a day (BID) | ORAL | Status: DC

## 2023-11-05 MED ORDER — DOXYCYCLINE HYCLATE 100 MG PO TABS
100.0000 mg | ORAL_TABLET | Freq: Two times a day (BID) | ORAL | Status: DC
  Filled 2023-11-05: qty 1

## 2023-11-05 MED ORDER — HYDROCODONE-ACETAMINOPHEN 10-325 MG PO TABS
1.0000 | ORAL_TABLET | Freq: Three times a day (TID) | ORAL | Status: DC
  Administered 2023-11-05: 1 via ORAL
  Filled 2023-11-05: qty 1

## 2023-11-05 MED ORDER — METRONIDAZOLE 500 MG PO TABS
500.0000 mg | ORAL_TABLET | Freq: Two times a day (BID) | ORAL | Status: DC
  Administered 2023-11-05: 500 mg via ORAL
  Filled 2023-11-05: qty 1

## 2023-11-05 MED ORDER — METRONIDAZOLE 500 MG PO TABS: 500.0000 mg | ORAL_TABLET | Freq: Two times a day (BID) | ORAL | 0 refills | Status: AC

## 2023-11-05 MED ORDER — POTASSIUM CHLORIDE 20 MEQ PO PACK
40.0000 meq | PACK | Freq: Two times a day (BID) | ORAL | Status: DC
  Administered 2023-11-05: 40 meq via ORAL
  Filled 2023-11-05: qty 2

## 2023-11-05 MED ORDER — HYDROCODONE-ACETAMINOPHEN 10-325 MG PO TABS: 1.0000 | ORAL_TABLET | Freq: Three times a day (TID) | ORAL | 0 refills | Status: AC

## 2023-11-05 MED ORDER — ACETAMINOPHEN 500 MG PO TABS: 500.0000 mg | ORAL_TABLET | Freq: Three times a day (TID) | ORAL | Status: AC

## 2023-11-05 NOTE — Discharge Summary (Signed)
Physician Discharge Summary   Patient: [name] MRN: [id number] DOB: February 03, 1934  Admit date:     10/28/2023  Discharge date: 11/04/23  Discharge Physician: Marguerita Merles, DO   PCP: [name], MD   Recommendations at discharge:   Follow up up with PCP within 1-2 weeks and repeat CBC, CMP, Mag, Phos within 1 week Have Palliative Care follow up it the outpatient setting Follow up with Urology in the outpatient setting for outpatient cystoscopy to evaluate bladder mass Follow up with Back Surgery at [facility] for Lumbar Wound Incision assessment, care, and follow up  Discharge Diagnoses: Principal Problem:   Acute renal failure superimposed on stage 3a chronic kidney disease (HCC) Active Problems:   Essential hypertension   Diabetes mellitus (HCC)   Transient hypotension   Acute metabolic encephalopathy   Near syncope   Physical deconditioning   Hematuria   AMS (altered mental status)  Resolved Problems:   * No resolved hospital problems. [facility] Course: The Patient 89 year old gentleman history of prostate cancer status post radiation, CKD stage II-3A, renal cell carcinoma status post nephrectomy, bladder cancer, hypertension, hyperlipidemia, CAD status post CABG, type 2 diabetes, chronic HFrEF, history of TAVR, chronic back pain on chronic opiates, gout, GERD recently admitted 09/23/2023-09/30/2023 for surgical wound infection of the back with cellulitis and UTI. Patient also noted to be hypotensive during that hospitalization and treated with midodrine which was discontinued prior to discharge.   Patient presented to the ED with transient hypotension which responded to IV fluids, dysuria, generalized weakness x 1 week. Family also with concerns for confusion over the past few days and near syncopal episode. Patient noted to have had decreased oral intake. Patient seen in the ED, placed on IV fluids, COVID-19 PCR, influenza AMB PCR, RSV PCR negative. Blood cultures obtained.  Urinalysis also obtained as well as urine cultures. Patient also noted to be in acute kidney injury. Head CT negative. Patient given Percocet, vancomycin, cefepime, metronidazole in the ED and patient admitted for further evaluation and management.   **Interim History  Renal function is improved and his confusion is slowly improving.  Blood pressure control remain low and his antihypertensives and diuretics have been held and has been placed on midodrine.  PT OT recommending SNF but his participation has been very limited so he will be better as a long-term care candidate and has a bed available.  His blood pressure is stable and he is stable for discharge and will need to follow-up with PCP, palliative care and neurology in outpatient setting  ADDENDUM 11/05/23: The plan was to D/C the patient yesterday to LTC but prior to D/C he vomited. There was concern for Aspiration so he has been started on Abx and given a Flutter Valve and Incentive spirometry. Palliative Care was also consulted for GOC discussion but will continue current plan of care and have further Palliative Discussions in the outpatient setting. He is medically stable for D/C and medication changes have been made and he will need to follow up with the above specialists noted.   Assessment and Plan:  AKI on CKD stage II-IIIa, improved significantly and stable -Blood secondary to a prerenal azotemia in the setting of poor oral intake, dehydration and diuretics. -Creatinine on admission noted at 1.5 with baseline 1.0-1.2. -BUN/Cr Trend: Recent Labs  Lab 10/30/23 0708 10/31/23 0636 11/01/23 0527 11/02/23 0506 11/03/23 0519 11/04/23 0530 11/05/23 0526  BUN 18 15 15 16 14 15 16   CREATININE 1.16 1.08 1.11 1.08 1.08  1.08 1.06  Urinalysis done with trace leukocytes, nitrite negative, 11-20 WBCs. Urine Cx obtained  -Avoid Nephrotoxic Medications and continue to hold Torsemide, Contrast Dyes, Hypotension and Dehydration to Ensure Adequate  Renal Perfusion and will need to Renally Adjust Meds -Patient with cough on 10/30/2023 and due to concerns for volume overload patient given Lasix 20 mg IV x 1 with urine output of 1.5 L.  -Patient also transfused a unit of PRBCs and given Lasix 20 mg IV x 1 posttransfusion on 10/31/2023 with a urine output of 1.650 L. -Continue to Monitor and Trend Renal Function carefully and repeat CMP within 1 week   Hematuria/Abnormal CT renal stone protocol -Per RN patient with hematuria early on during the hospitalization which seems to be clearing up.  -Patient with complaints of back pain however does have chronic back pain. -CT renal stone protocol with asymmetric wall thickening of the inferior right bladder worrisome for neoplasm.  Recommend cystoscopy.  Small amount of layering hyperdensity in the bladder worrisome for blood products.  Nonobstructing right renal calculi.  Left renal atrophy with adjacent presumed scarring.  Bilateral renal cysts.  Colonic diverticulosis.  Trace bilateral pleural effusions.   -Patient with prior history of bladder cancer, renal cell carcinoma status post nephrectomy, now with some hematuria and abnormal CT scan concerning for recurrent bladder cancer. -Patient seen in consultation by Urology who are recommending outpatient follow-up for cystoscopy to evaluate bladder mass.  -C/w Supportive care and follow-up with urology in outpatient setting -Palliative Care consulted for GOC discussion and will need further discussions as an outpatient    Acute Metabolic Encephalopathy, improved -Per family patient noted to have some confusion. -Likely multifactorial secondary to dehydration in the setting of chronic opioid use for back pain. -Patient received IV antibiotics in the ED, no obvious infection noted at this time as patient with no fever, no leukocytosis, no meningeal signs. -WBC Trend: Recent Labs  Lab 10/30/23 0708 10/31/23 0636 11/01/23 0527 11/02/23 0506  11/03/23 0519 11/04/23 0530 11/05/23 0526  WBC 9.7 10.9* 11.3* 10.0 8.6 9.8 8.7  -Patient with no respiratory symptoms, chest x-ray negative for any acute infiltrates. -Urinalysis not strongly suggestive of UTI with urine cultures pending. -Patient with no signs of infection of lower back surgical wound site at this time. -CT head negative for any acute abnormalities. -Patient with recent labs a month ago with TSH, vitamin B12, B1 levels within normal limits. -Ammonia levels within normal limits. -Initial blood cultures drawn on admission with MRSE likely contaminant. -Repeat blood cultures x 2 pending with no growth to date.. -Patient improving clinically but slowly. -Patient had some bouts of hypotension could likely be contributing to patient's encephalopathy in the setting of narcotic use. -Continue to minimize narcotics and decreased Oxycodone to 2.5 mg p.o. every 6 hours as needed. -If no significant improvement may need to consider MRI of the brain but is stable. -C/w Supportive care in the outpatient setting  Suspected Aspiration PNA -CXR done and showed " Increased central vascular prominence, development of mild interstitial edema in the lung bases and small pleural effusions. Increased opacity in the lung bases which could be due to atelectasis or  pneumonic infiltrate. Low inspiration. Aortic atherosclerosis." -SpO2: 96 % -Start Doxy/Flagyl for concern for Aspiration from vomiting yesterday and continue for 7 days -Repeat CXR in 3-6 weeks   Hypotension, stable -Patient presentation noted to have a transient hypotension which responded to IV fluids. Patient bout of hypotension with systolic blood pressures in the 80s.   -  BP fluctuating noted to have been on midodrine before in the past. -Concern for volume overload and as such saline lock IV fluids. -BP borderline, patient given Lasix 20 mg IV x 1 on 10/30/2023  -Patient with hypotension this morning with systolics in the  80s, arousable, noted to have received some oxycodone prior to this this morning. -Decrease oxycodone to 2.5 mg p.o. every 6 hours as needed. -Patient was given IV Albumin 25 q6h x 1 day -Currently remains on Midodrine 10 mg po TID with meals -Continue to hold Toprol-XL, Torsemide at D/C.  -Continue to Monitor BP per Protocol  -Last BP reading was stable at 105/63 -Follow up with Palliative Care in the outpatient setting   Near Syncope -As noted per admitting physician (ED report family reported near syncopal episode the day prior to admission likely secondary to dehydration and hypotension. -CT head with no acute abnormalities. -Patient with no acute focal neurological deficits. -Patient not hypoxic with no shortness of breath and low probability for PE. -Clinical improvement with hydration. -Will place on TED Hose for D/C   Generalized Weakness/Physical Deconditioning -PT/OT recommending SNF but he is more of a LTC candiditate.    Diabetes Mellitus Type 2 -Hemoglobin A1c 6.4 (10/28/2023) -CBG Trend: Recent Labs  Lab 11/03/23 1654 11/03/23 2048 11/04/23 0730 11/04/23 1153 11/04/23 1740 11/04/23 2145 11/05/23 0742  GLUCAP 132* 113* 138* 150* 132* 100* 101*  -Continue to hold oral hypoglycemic agents of Glipizide 5 mg po BID and will not resume at D/C -C/w Sensitive Novolog SSI AC/HS   Chronic Back Pain -Patient noted to be on chronic opioids for a while for his chronic back pain. -Due to concerns opioid may be contributing to patient's altered mental status on presentation and as such patient placed on oxycodone 5 mg every 4 hours as needed pain and reduced to 2.5 mg po q6hprn .   -Continue scheduled Tylenol 1000 mg 3 times daily at facility.  -Patient noted to have a bout of hypotension with systolics in the 80s after received some oxycodone early on this morning. -Continue to Monitor and resume Lidocaine 5% Patch    Chronic HFrEF -2D echo from 08/05/2023 with a EF of 30%,  grade 1 diastolic dysfunction, right ventricular systolic function moderately reduced, mild MVR, trivial AVR.  Patient with no signs of significant volume overload on exam however patient with coughing on 11/8-11/9 and felt likely secondary to volume overload.. -Diuretics held on admission but he is Status post Lasix 20 mg IV x 1 10/30/2023 and Lasix 20 mg IV x 1 on 10/31/2023 -Strict I's and O's and Daily Weights  Intake/Output Summary (Last 24 hours) at 11/05/2023 1010 Last data filed at 11/05/2023 0545 Gross per 24 hour  Intake --  Output 551 ml  Net -551 ml  -Saline lock IV fluids. -Continue to Monitor for S/Sx of Volume Overload  Hyponatremia -Na+ Trend: Recent Labs  Lab 10/30/23 0708 10/31/23 0636 11/01/23 0527 11/02/23 0506 11/03/23 0519 11/04/23 0530 11/05/23 0526  NA 141 135 133* 135 134* 132* 132*  -Continue to Monitor and Trend and repeat CMP in the AM   Normocytic Anemia -Patient noted with some hematuria per RN. -Hgb/Hct Trend: Recent Labs  Lab 10/31/23 0636 10/31/23 1835 11/01/23 0527 11/02/23 0506 11/03/23 0519 11/04/23 0530 11/05/23 0526  HGB 7.9* 9.5* 9.6* 9.3* 8.2* 9.5* 9.1*  HCT 26.0* 30.4* 29.7* 29.5* 26.6* 30.5* 29.4*  MCV 98.1  --  94.9 97.0 96.7 96.2 96.7  -Anemia panel with iron  of 32, TIBC of 237, ferritin of 429, folate of 20.4, vitamin B12 of 385. -Status post transfusion 1 unit PRBCs.  -Continue to Monitor for S/Sx of Bleeding -Repeat CBC in the AM    GERD/GI Prophylaxis  -C/w Famotidine 40 mg po Daily    CAD status post CABG -Stable. -Continue to hold home regimen aspirin in light of concern for hematuria. -Due to transient hypotension on admission continue to hold Metoprolol Succinate 25 mg po Daily and Torsemide 20 mg po MWFor now. -If necessary will resume home NTG SL 0.4 mg po q50min  prn CP  Hypoalbuminemia -Patient's Albumin Trend: Recent Labs  Lab 10/28/23 1227 11/02/23 0506 11/04/23 0530 11/05/23 0526  ALBUMIN 2.4* 2.2*  3.1* 2.8*  -Continue to Monitor and Trend and repeat CMP in the AM  Back Lumbar Wound -Had Surgery at [facility] 9/16/ and seen at [facility] 10/1 -WOC consulted and recommending "lean back wound with NS, apply Silver Hydrofiber (Aquacel [place] [phone]) cut to fit wound bed daily,  use a Q tip applicator to insert into wound bed leaving a tail of silver hanging out of wound for easy removal.   Cover with dry gauze and silicone foam. May lift foam daily to replace silver.  Change foam q3 days and prn soiling. Moisten silver with NS if stuck to wound bed for atraumatic removal." -Will need to follow up with Back Surgeon at [facility] after discharge for continued assessment and care of this incision -Palliative consulted and adjutsed medications and will need outpatient follow up. Consultants: Urology Procedures performed: As delineated as above  Disposition: Long term care facility  Diet recommendation:  Discharge Diet Orders (From admission, onward)     Start     Ordered   11/04/23 0000  Diet - low sodium heart healthy       Comments: Dysphagia 3 Diet with Thin Liquid   11/04/23 1155           Cardiac and Carb modified diet - DYSPHAGIA 3 Diet with Thin Liquid  DISCHARGE MEDICATION: Allergies as of 11/05/2023       Reactions   Fish Allergy Swelling   Dark fish- salmon, tuna    Lipitor [atorvastatin] Other (See Comments)   Muscle pain   Penicillins Swelling   SWELLING OF THE FEET Has patient had a PCN reaction causing immediate rash, facial/tongue/throat swelling, SOB or lightheadedness with hypotension: no Has patient had a PCN reaction causing severe rash involving mucus membranes or skin necrosis: no Has patient had a PCN reaction that required hospitalization no Has patient had a PCN reaction occurring within the last 10 years: no If all of the above answers are "NO", then may proceed with Cephalosporin use.   Simvastatin Other (See Comments)   MUSCLE PAIN    Adacel [tetanus-diphth-acell Pertussis]    UNSPECIFIED REACTION    Lasix [furosemide] Swelling   SWELLING REACTION UNSPECIFIED    Septra [sulfamethoxazole-trimethoprim]    UNSPECIFIED REACTION    Flexeril [cyclobenzaprine] Other (See Comments)   Causes confusion   Pantoprazole Diarrhea        Medication List     STOP taking these medications    glipiZIDE 10 MG tablet Commonly known as: GLUCOTROL   melatonin 5 MG Tabs   metoprolol succinate 25 MG 24 hr tablet Commonly known as: Toprol XL   torsemide 20 MG tablet Commonly known as: DEMADEX       TAKE these medications    acetaminophen 500 MG tablet  Commonly known as: TYLENOL Take 1 tablet (500 mg total) by mouth 3 (three) times daily.   aspirin 81 MG chewable tablet Chew 81 mg by mouth every evening.   doxycycline 100 MG tablet Commonly known as: VIBRA-TABS Take 1 tablet (100 mg total) by mouth every 12 (twelve) hours.   DULoxetine 20 MG capsule Commonly known as: CYMBALTA Take 20 mg by mouth 2 (two) times daily.   nutrition supplement (JUVEN) Pack Take 1 packet by mouth 2 (two) times daily between meals.   Ensure Max Protein Liqd Take 330 mLs (11 oz total) by mouth daily.   famotidine 20 MG tablet Commonly known as: PEPCID Take 1 tablet (20 mg total) by mouth daily.   gabapentin 100 MG capsule Commonly known as: NEURONTIN Take 100 mg by mouth every 6 (six) hours as needed (nerve pain).   HYDROcodone-acetaminophen 10-325 MG tablet Commonly known as: NORCO Take 1 tablet by mouth every 8 (eight) hours for 3 days. What changed:  when to take this reasons to take this additional instructions   lidocaine 5 % Commonly known as: LIDODERM Place 1 patch onto the skin as needed. Remove & Discard patch within 12 hours or as directed by MD   metroNIDAZOLE 500 MG tablet Commonly known as: FLAGYL Take 1 tablet (500 mg total) by mouth every 12 (twelve) hours for 7 days.   midodrine 10 MG tablet Commonly  known as: PROAMATINE Take 1 tablet (10 mg total) by mouth 3 (three) times daily with meals.   multivitamin tablet Take 1 tablet by mouth at bedtime.   nitroGLYCERIN 0.4 MG SL tablet Commonly known as: NITROSTAT Place 1 tablet (0.4 mg total) under the tongue every 5 (five) minutes as needed for chest pain.   nystatin cream Commonly known as: MYCOSTATIN Apply 1 Application topically 3 (three) times daily.   ondansetron 4 MG tablet Commonly known as: ZOFRAN Take 4 mg by mouth every 6 (six) hours as needed for nausea or vomiting.   oxyCODONE 5 MG immediate release tablet Commonly known as: Oxy IR/ROXICODONE Take 0.5 tablets (2.5 mg total) by mouth every 6 (six) hours as needed for up to 3 days for moderate pain (pain score 4-6).   polyethylene glycol 17 g packet Commonly known as: MIRALAX / GLYCOLAX Take 17 g by mouth daily as needed for mild constipation.   psyllium 95 % Pack Commonly known as: HYDROCIL/METAMUCIL Take 1 packet by mouth daily as needed for mild constipation.   ROLAIDS PO Take by mouth as needed (reflux).   Systane Complete 0.6 % Soln Generic drug: Propylene Glycol Place 1 drop into the left eye 2 (two) times daily.   tamsulosin 0.4 MG Caps capsule Commonly known as: FLOMAX Take 0.4 mg by mouth 2 (two) times daily.   Vitamin D-3 125 MCG (5000 UT) Tabs Take 5,000 Units by mouth daily.               Discharge Care Instructions  (From admission, onward)           Start     Ordered   11/05/23 0000  Discharge wound care:       Comments: Clean back wound with NS, apply Silver Hydrofiber (Aquacel [place] [phone]) cut to fit wound bed daily,  use a Q tip applicator to insert into wound bed leaving a tail of silver hanging out of wound for easy removal.   Cover with dry gauze and silicone foam. May lift foam daily to replace silver.  Change foam q3 days and prn soiling. Moisten silver with NS if stuck to wound bed for atraumatic removal.   11/05/23  1019   11/04/23 0000  Discharge wound care:       Comments: Clean back wound with NS, apply Silver Hydrofiber (Aquacel [employer] [place] [phone]) cut to fit wound bed daily,  use a Q tip applicator to insert into wound bed leaving a tail of silver hanging out of wound for easy removal.   Cover with dry gauze and silicone foam. May lift foam daily to replace silver.  Change foam q3 days and prn soiling. Moisten silver with NS if stuck to wound bed for atraumatic removal.   11/04/23 1155            Discharge Exam: Filed Weights   10/28/23 1247  Weight: 74 kg   Vitals:   11/05/23 0318 11/05/23 0732  BP: 106/72 105/63  Pulse: 98 98  Resp: 13 17  Temp: 97.8 F (36.6 C) 97.9 F (36.6 C)  SpO2: 97% 96%   Examination: Physical Exam:  Constitutional: Elderly chronically ill-appearing overweight Caucasian male in no acute distress was resting and okay from his sleep Respiratory: Diminished to auscultation bilaterally, no wheezing, rales, rhonchi or crackles. Normal respiratory effort and patient is not tachypenic. No accessory muscle use.  Unlabored breathing Cardiovascular: RRR, no murmurs / rubs / gallops. S1 and S2 auscultated.  Trace extremity edema. .  Abdomen: Soft, non-tender, slightly distended secondary to body habitus. Bowel sounds positive.  GU: Deferred. Musculoskeletal: No clubbing / cyanosis of digits/nails. No joint deformity upper and lower extremities Neurologic: A little somnolent and drowsy but easily arousable and Psychiatric: Was drowsy but when he is awoken and he is oriented x 2  Condition at discharge: stable  The results of significant diagnostics from this hospitalization (including imaging, microbiology, ancillary and laboratory) are listed below for reference.   Imaging Studies: DG CHEST PORT 1 VIEW  Result Date: 11/05/2023 CLINICAL DATA:  [id number] with shortness of breath. EXAM: PORTABLE CHEST 1 VIEW COMPARISON:  Portable chest 10/30/2023 FINDINGS: 5:02 a.m.  TAVR and CABG changes are again shown and mild cardiomegaly. There is increased central vascular prominence, development of mild interstitial edema in the lung bases and small pleural effusions. Inspiration is lower than previously. There is increased opacity in the lung bases which could be due to atelectasis or pneumonic infiltrate. The upper lung fields are clear. Stable mediastinum. The aorta is tortuous and calcified. Osteopenia. IMPRESSION: 1. Increased central vascular prominence, development of mild interstitial edema in the lung bases and small pleural effusions. 2. Increased opacity in the lung bases which could be due to atelectasis or pneumonic infiltrate. Low inspiration. 3. Aortic atherosclerosis. Electronically Signed   By: Almira Bar M.D.   On: 11/05/2023 07:12   CT RENAL STONE STUDY  Result Date: 10/30/2023 CLINICAL DATA:  Abdominal and flank pain.  Hematuria. EXAM: CT ABDOMEN AND PELVIS WITHOUT CONTRAST TECHNIQUE: Multidetector CT imaging of the abdomen and pelvis was performed following the standard protocol without IV contrast. RADIATION DOSE REDUCTION: This exam was performed according to the departmental dose-optimization program which includes automated exposure control, adjustment of the mA and/or kV according to patient size and/or use of iterative reconstruction technique. COMPARISON:  None Available. FINDINGS: Lower chest: There are trace bilateral pleural effusions. There is a stable 5 mm nodular density in the right lower lobe image 7/26. Hepatobiliary: No focal liver abnormality is seen. No gallstones, gallbladder wall thickening, or biliary dilatation.  Pancreas: Unremarkable. No pancreatic ductal dilatation or surrounding inflammatory changes. Spleen: Normal in size without focal abnormality. Adrenals/Urinary Tract: There is a small amount of layering hyperdensity in the bladder. There is asymmetric wall thickening of the inferior right bladder measuring up to 15 mm. There is  left renal atrophy with some adjacent presumed scarring laterally. Left renal cyst measuring 10 mm is unchanged. There is no hydronephrosis. There are punctate calculi in the right kidney measuring up to 5 mm. There is an exophytic cyst in the inferior pole measuring 4.6 cm. There is no hydronephrosis. The adrenal glands are within normal limits. Stomach/Bowel: Stomach is within normal limits. Appendix a is not seen ppears normal. No evidence of bowel wall thickening, distention, or inflammatory changes. There is sigmoid and descending colon diverticulosis. Vascular/Lymphatic: Aortic atherosclerosis. No enlarged abdominal or pelvic lymph nodes. Reproductive: Prostate radiotherapy seeds are present. Other: There is mild stranding and minimal fluid in the pelvis, nonspecific. There is a small fat containing umbilical hernia. Musculoskeletal: The bones are osteopenic. L1-L4 posterior fusion hardware is present. IMPRESSION: 1. Asymmetric wall thickening of the inferior right bladder worrisome for neoplasm. Recommend cystoscopy. 2. Small amount of layering hyperdensity in the bladder worrisome for blood products. 3. Nonobstructing right renal calculi. 4. Left renal atrophy with adjacent presumed scarring. 5. Bilateral renal cysts. No follow-up imaging recommended. 6. Colonic diverticulosis. 7. Trace bilateral pleural effusions. 8. Stable 5 mm nodular density in the right lower lobe. 9. Mild stranding and minimal fluid in the pelvis, nonspecific. Aortic Atherosclerosis (ICD10-I70.0). Electronically Signed   By: Darliss Cheney M.D.   On: 10/30/2023 20:28   DG CHEST PORT 1 VIEW  Result Date: 10/30/2023 CLINICAL DATA:  Cough EXAM: PORTABLE CHEST 1 VIEW COMPARISON:  10/28/2023 FINDINGS: Cardiomegaly status post median sternotomy and CABG. Mild diffuse interstitial opacity. Focal airspace opacity. No acute osseous findings. IMPRESSION: Cardiomegaly with mild diffuse interstitial opacity, likely edema. No focal airspace  opacity. Electronically Signed   By: Jearld Lesch M.D.   On: 10/30/2023 17:45   CT Head Wo Contrast  Result Date: 10/28/2023 CLINICAL DATA:  Hypotension, weakness EXAM: CT HEAD WITHOUT CONTRAST TECHNIQUE: Contiguous axial images were obtained from the base of the skull through the vertex without intravenous contrast. RADIATION DOSE REDUCTION: This exam was performed according to the departmental dose-optimization program which includes automated exposure control, adjustment of the mA and/or kV according to patient size and/or use of iterative reconstruction technique. COMPARISON:  09/28/2023 FINDINGS: Brain: No evidence of acute infarction, hemorrhage, mass, mass effect, or midline shift. No hydrocephalus or extra-axial fluid collection. Age related cerebral atrophy Vascular: No hyperdense vessel. Atherosclerotic calcifications in the intracranial carotid and vertebral arteries. Skull: Negative for fracture or focal lesion. Sinuses/Orbits: No acute finding. Other: The mastoid air cells are well aerated. IMPRESSION: No acute intracranial process. Electronically Signed   By: Wiliam Ke M.D.   On: 10/28/2023 19:36   DG Chest Port 1 View  Result Date: 10/28/2023 CLINICAL DATA:  Possible sepsis.  Hypotension. EXAM: PORTABLE CHEST 1 VIEW COMPARISON:  September 23, 2023. FINDINGS: Stable cardiomediastinal silhouette. 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 Hypoinflation of the lungs is noted. Lungs are clear. Bony thorax is unremarkable. IMPRESSION: Hypoinflation of the lungs.  No acute abnormality seen. Electronically Signed   By: Lupita Raider M.D.   On: 10/28/2023 14:46    Microbiology: Results for orders placed or performed during the hospital encounter of 10/28/23  Resp panel by RT-PCR (RSV,  Flu A&B, Covid) Anterior Nasal Swab     Status: None   Collection Time: 10/28/23 12:45 PM   Specimen: Anterior Nasal Swab  Result Value Ref Range Status   SARS Coronavirus  2 by RT PCR NEGATIVE NEGATIVE Final    Comment: (NOTE) SARS-CoV-2 target nucleic acids are NOT DETECTED.  The SARS-CoV-2 RNA is generally detectable in upper respiratory specimens during the acute phase of infection. The lowest concentration of SARS-CoV-2 viral copies this assay can detect is 138 copies/mL. A negative result does not preclude SARS-Cov-2 infection and should not be used as the sole basis for treatment or other patient management decisions. A negative result may occur with  improper specimen collection/handling, submission of specimen other than nasopharyngeal swab, presence of viral mutation(s) within the areas targeted by this assay, and inadequate number of viral copies(<138 copies/mL). A negative result must be combined with clinical observations, patient history, and epidemiological information. The expected result is Negative.  Fact Sheet for Patients:  BloggerCourse.com  Fact Sheet for Healthcare Providers:  SeriousBroker.it  This test is no t yet approved or cleared by the [place] FDA and  has been authorized for detection and/or diagnosis of SARS-CoV-2 by FDA under an Emergency Use Authorization (EUA). This EUA will remain  in effect (meaning this test can be used) for the duration of the COVID-19 declaration under Section 564(b)(1) of the Act, 21 U.S.C.section 360bbb-3(b)(1), unless the authorization is terminated  or revoked sooner.       Influenza A by PCR NEGATIVE NEGATIVE Final   Influenza B by PCR NEGATIVE NEGATIVE Final    Comment: (NOTE) The Xpert Xpress SARS-CoV-2/FLU/RSV plus assay is intended as an aid in the diagnosis of influenza from Nasopharyngeal swab specimens and should not be used as a sole basis for treatment. Nasal washings and aspirates are unacceptable for Xpert Xpress SARS-CoV-2/FLU/RSV testing.  Fact Sheet for Patients: BloggerCourse.com  Fact  Sheet for Healthcare Providers: SeriousBroker.it  This test is not yet approved or cleared by the [place] FDA and has been authorized for detection and/or diagnosis of SARS-CoV-2 by FDA under an Emergency Use Authorization (EUA). This EUA will remain in effect (meaning this test can be used) for the duration of the COVID-19 declaration under Section 564(b)(1) of the Act, 21 U.S.C. section 360bbb-3(b)(1), unless the authorization is terminated or revoked.     Resp Syncytial Virus by PCR NEGATIVE NEGATIVE Final    Comment: (NOTE) Fact Sheet for Patients: BloggerCourse.com  Fact Sheet for Healthcare Providers: SeriousBroker.it  This test is not yet approved or cleared by the [place] FDA and has been authorized for detection and/or diagnosis of SARS-CoV-2 by FDA under an Emergency Use Authorization (EUA). This EUA will remain in effect (meaning this test can be used) for the duration of the COVID-19 declaration under Section 564(b)(1) of the Act, 21 U.S.C. section 360bbb-3(b)(1), unless the authorization is terminated or revoked.  Performed at [facility], [address] [place], [place] [address]   Blood Culture (routine x 2)     Status: Abnormal   Collection Time: 10/28/23 12:50 PM   Specimen: BLOOD  Result Value Ref Range Status   Specimen Description   Final    BLOOD SITE NOT SPECIFIED Performed at [facility], [address], [place], [place] [address]    Special Requests   Final    BOTTLES DRAWN AEROBIC AND ANAEROBIC Blood Culture results may not be optimal due to an inadequate volume of blood  received in culture bottles Performed at [facility] [address] [place], [place] [address]    Culture  Setup Time   Final    GRAM POSITIVE COCCI BOTTLES DRAWN AEROBIC ONLY CRITICAL RESULT CALLED TO, READ BACK BY AND VERIFIED  WITH: M LILLISTON,PHARMD@[email or web address]  10/30/23 MK    Culture (A)  Final    STAPHYLOCOCCUS EPIDERMIDIS THE SIGNIFICANCE OF ISOLATING THIS ORGANISM FROM A SINGLE SET OF BLOOD CULTURES WHEN MULTIPLE SETS ARE DRAWN IS UNCERTAIN. PLEASE NOTIFY THE MICROBIOLOGY DEPARTMENT WITHIN ONE WEEK IF SPECIATION AND SENSITIVITIES ARE REQUIRED. Performed at [facility], [address], [place], [place] [address]    Report Status 11/01/2023 FINAL  Final  Blood Culture ID Panel (Reflexed)     Status: Abnormal   Collection Time: 10/28/23 12:50 PM  Result Value Ref Range Status   Enterococcus faecalis NOT DETECTED NOT DETECTED Final   Enterococcus Faecium NOT DETECTED NOT DETECTED Final   Listeria monocytogenes NOT DETECTED NOT DETECTED Final   Staphylococcus species DETECTED (A) NOT DETECTED Final    Comment: CRITICAL RESULT CALLED TO, READ BACK BY AND VERIFIED WITH: M LILLISTON,PHARMD@0011  10/30/23 MK    Staphylococcus aureus (BCID) NOT DETECTED NOT DETECTED Final   Staphylococcus epidermidis DETECTED (A) NOT DETECTED Final    Comment: Methicillin (oxacillin) resistant coagulase negative staphylococcus. Possible blood culture contaminant (unless isolated from more than one blood culture draw or clinical case suggests pathogenicity). No antibiotic treatment is indicated for blood  culture contaminants. CRITICAL RESULT CALLED TO, READ BACK BY AND VERIFIED WITH: M LILLISTON,PHARMD@0011  10/30/23 MK    Staphylococcus lugdunensis NOT DETECTED NOT DETECTED Final   Streptococcus species NOT DETECTED NOT DETECTED Final   Streptococcus agalactiae NOT DETECTED NOT DETECTED Final   Streptococcus pneumoniae NOT DETECTED NOT DETECTED Final   Streptococcus pyogenes NOT DETECTED NOT DETECTED Final   A.calcoaceticus-baumannii NOT DETECTED NOT DETECTED Final   Bacteroides fragilis NOT DETECTED NOT DETECTED Final   Enterobacterales NOT DETECTED NOT DETECTED Final   Enterobacter cloacae complex NOT DETECTED NOT DETECTED  Final   Escherichia coli NOT DETECTED NOT DETECTED Final   Klebsiella aerogenes NOT DETECTED NOT DETECTED Final   Klebsiella oxytoca NOT DETECTED NOT DETECTED Final   Klebsiella pneumoniae NOT DETECTED NOT DETECTED Final   Proteus species NOT DETECTED NOT DETECTED Final   Salmonella species NOT DETECTED NOT DETECTED Final   Serratia marcescens NOT DETECTED NOT DETECTED Final   Haemophilus influenzae NOT DETECTED NOT DETECTED Final   Neisseria meningitidis NOT DETECTED NOT DETECTED Final   Pseudomonas aeruginosa NOT DETECTED NOT DETECTED Final   Stenotrophomonas maltophilia NOT DETECTED NOT DETECTED Final   Candida albicans NOT DETECTED NOT DETECTED Final   Candida auris NOT DETECTED NOT DETECTED Final   Candida glabrata NOT DETECTED NOT DETECTED Final   Candida krusei NOT DETECTED NOT DETECTED Final   Candida parapsilosis NOT DETECTED NOT DETECTED Final   Candida tropicalis NOT DETECTED NOT DETECTED Final   Cryptococcus neoformans/gattii NOT DETECTED NOT DETECTED Final   Methicillin resistance mecA/C DETECTED (A) NOT DETECTED Final    Comment: CRITICAL RESULT CALLED TO, READ BACK BY AND VERIFIED WITH: M LILLISTON,[email or web address]@[email or web address]  10/30/23 MK Performed at [facility], [address], [place], [place] [address]   Blood Culture (routine x 2)     Status: None   Collection Time: 10/28/23  1:00 PM   Specimen: BLOOD  Result Value Ref Range Status   Specimen Description   Final    BLOOD SITE NOT  SPECIFIED Performed at [facility], [address], [place], [place] [address]    Special Requests   Final    BOTTLES DRAWN AEROBIC AND ANAEROBIC Blood Culture adequate volume Performed at [facility], [address], [place], [place] [address]    Culture   Final    NO GROWTH 5 DAYS Performed at [facility], [address], [place], [place] [address]    Report Status 11/02/2023 FINAL  Final  Urine Culture     Status: Abnormal   Collection  Time: 10/28/23  2:39 PM   Specimen: Urine, Random  Result Value Ref Range Status   Specimen Description   Final    URINE, RANDOM Performed at [facility], [address], [place], [place] [address]    Special Requests   Final    NONE Reflexed from [id number] Performed at [facility], [address], [place], [place] [address]    Culture (A)  Final    <10,000 COLONIES/mL INSIGNIFICANT GROWTH Performed at [facility], [address] [place], [place] [address]    Report Status 10/29/2023 FINAL  Final  Urine Culture (for pregnant, neutropenic or urologic patients or patients with an indwelling urinary catheter)     Status: None   Collection Time: 10/29/23 12:03 PM   Specimen: Urine, Clean Catch  Result Value Ref Range Status   Specimen Description   Final    URINE, CLEAN CATCH Performed at [facility], [address] [place], [place] [address]    Special Requests   Final    NONE Performed at [facility], [address] [place], [place] [address]    Culture   Final    NO GROWTH Performed at [facility], [address], [place], [place] [address]    Report Status 10/30/2023 FINAL  Final  Culture, blood (Routine X 2) w Reflex to ID Panel     Status: None   Collection Time: 10/30/23 12:16 PM   Specimen: BLOOD  Result Value Ref Range Status   Specimen Description   Final    BLOOD BLOOD RIGHT ARM Performed at [facility], [address], [place], [place] [address]    Special Requests   Final    BOTTLES DRAWN AEROBIC AND ANAEROBIC Blood Culture adequate volume Performed at [facility], [address] [place], [place] [address]    Culture   Final    NO GROWTH 5 DAYS Performed at [facility], [address] [place], [place] [address]    Report Status 11/04/2023 FINAL  Final  Culture, blood (Routine X 2) w Reflex to ID Panel     Status: None    Collection Time: 10/30/23 12:19 PM   Specimen: BLOOD  Result Value Ref Range Status   Specimen Description   Final    BLOOD BLOOD LEFT HAND Performed at [facility], [address], [place], [place] [address]    Special Requests   Final    BOTTLES DRAWN AEROBIC AND ANAEROBIC Blood Culture adequate volume Performed at [facility], [address], [place], [place] [address]    Culture   Final    NO GROWTH 5 DAYS Performed at [facility], [address], [place], [place] [address]    Report Status 11/04/2023 FINAL  Final   Labs: CBC: Recent Labs  Lab 10/30/23 0708 10/31/23 0636  10/31/23 1835 11/01/23 0527 11/02/23 0506 11/03/23 0519 11/04/23 0530 11/05/23 0526  WBC 9.7 10.9*  --  11.3* 10.0 8.6 9.8 8.7  NEUTROABS 6.4 7.3  --   --  6.1  --  6.5 5.7  HGB 7.8* 7.9*   < > 9.6* 9.3* 8.2* 9.5* 9.1*  HCT 25.0* 26.0*   < > 29.7* 29.5* 26.6* 30.5* 29.4*  MCV 97.7 98.1  --  94.9 97.0 96.7 96.2 96.7  PLT 309 314  --  322 325 287 321 316   < > = values in this interval not displayed.   Basic Metabolic Panel: Recent Labs  Lab 11/01/23 0527 11/02/23 0506 11/03/23 0519 11/04/23 0530 11/05/23 0526  NA 133* 135 134* 132* 132*  K 3.5 3.6 3.8 3.9 3.4*  CL 99 100 100 98 98  CO2 24 25 26 26 25   GLUCOSE 128* 116* 114* 135* 100*  BUN 15 16 14 15 16   CREATININE 1.11 1.08 1.08 1.08 1.06  CALCIUM 9.9 10.1 10.2 10.1 9.9  MG  --   --   --  1.9 1.9  PHOS  --  2.3*  --  2.6 3.4   Liver Function Tests: Recent Labs  Lab 11/02/23 0506 11/04/23 0530 11/05/23 0526  AST  --  22 20  ALT  --  12 11  ALKPHOS  --  45 43  BILITOT  --  0.9 1.0  PROT  --  6.3* 6.1*  ALBUMIN 2.2* 3.1* 2.8*   CBG: Recent Labs  Lab 11/04/23 0730 11/04/23 1153 11/04/23 1740 11/04/23 2145 11/05/23 0742  GLUCAP 138* 150* 132* 100* 101*    Discharge time spent: greater than 30 minutes.  Signed: Marguerita Merles, DO [facility] 11/05/2023

## 2023-11-05 NOTE — Plan of Care (Signed)
  Problem: Metabolic: Goal: Ability to maintain appropriate glucose levels will improve Outcome: Progressing   Problem: Nutritional: Goal: Maintenance of adequate nutrition will improve Outcome: Progressing   Problem: Skin Integrity: Goal: Risk for impaired skin integrity will decrease Outcome: Progressing   Problem: Clinical Measurements: Goal: Ability to maintain clinical measurements within normal limits will improve Outcome: Progressing Goal: Will remain free from infection Outcome: Progressing   Problem: Pain Management: Goal: General experience of comfort will improve Outcome: Progressing   Problem: Safety: Goal: Ability to remain free from injury will improve Outcome: Progressing

## 2023-11-05 NOTE — Progress Notes (Signed)
Attempt made to call report. No answer at this time. A VM was left with a call back number. Transport at bedside now.

## 2023-11-08 DIAGNOSIS — E1169 Type 2 diabetes mellitus with other specified complication: Secondary | ICD-10-CM | POA: Diagnosis not present

## 2023-11-08 DIAGNOSIS — I5042 Chronic combined systolic (congestive) and diastolic (congestive) heart failure: Secondary | ICD-10-CM | POA: Diagnosis not present

## 2023-11-08 DIAGNOSIS — G8929 Other chronic pain: Secondary | ICD-10-CM | POA: Diagnosis not present

## 2023-11-08 DIAGNOSIS — M791 Myalgia, unspecified site: Secondary | ICD-10-CM

## 2023-11-08 DIAGNOSIS — I959 Hypotension, unspecified: Secondary | ICD-10-CM

## 2023-11-08 DIAGNOSIS — L03312 Cellulitis of back [any part except buttock]: Secondary | ICD-10-CM | POA: Diagnosis not present

## 2023-11-08 DIAGNOSIS — R401 Stupor: Secondary | ICD-10-CM

## 2023-11-10 ENCOUNTER — Emergency Department (HOSPITAL_COMMUNITY)
Admission: EM | Admit: 2023-11-10 | Discharge: 2023-11-10 | Disposition: A | Discharge status: Home / Self Care | Payer: PPO | Attending: Emergency Medicine | Admitting: Emergency Medicine

## 2023-11-10 ENCOUNTER — Other Ambulatory Visit: Payer: Self-pay

## 2023-11-10 ENCOUNTER — Emergency Department (HOSPITAL_COMMUNITY): Payer: PPO

## 2023-11-10 DIAGNOSIS — Z7982 Long term (current) use of aspirin: Secondary | ICD-10-CM | POA: Insufficient documentation

## 2023-11-10 DIAGNOSIS — E1122 Type 2 diabetes mellitus with diabetic chronic kidney disease: Secondary | ICD-10-CM | POA: Diagnosis not present

## 2023-11-10 DIAGNOSIS — N179 Acute kidney failure, unspecified: Secondary | ICD-10-CM | POA: Diagnosis not present

## 2023-11-10 DIAGNOSIS — N189 Chronic kidney disease, unspecified: Secondary | ICD-10-CM | POA: Diagnosis not present

## 2023-11-10 DIAGNOSIS — E860 Dehydration: Secondary | ICD-10-CM | POA: Diagnosis not present

## 2023-11-10 DIAGNOSIS — I959 Hypotension, unspecified: Secondary | ICD-10-CM | POA: Insufficient documentation

## 2023-11-10 DIAGNOSIS — I509 Heart failure, unspecified: Secondary | ICD-10-CM | POA: Diagnosis not present

## 2023-11-10 DIAGNOSIS — Z8546 Personal history of malignant neoplasm of prostate: Secondary | ICD-10-CM | POA: Insufficient documentation

## 2023-11-10 DIAGNOSIS — E162 Hypoglycemia, unspecified: Secondary | ICD-10-CM

## 2023-11-10 DIAGNOSIS — R410 Disorientation, unspecified: Secondary | ICD-10-CM | POA: Diagnosis not present

## 2023-11-10 DIAGNOSIS — I2510 Atherosclerotic heart disease of native coronary artery without angina pectoris: Secondary | ICD-10-CM | POA: Diagnosis not present

## 2023-11-10 DIAGNOSIS — E11649 Type 2 diabetes mellitus with hypoglycemia without coma: Secondary | ICD-10-CM | POA: Insufficient documentation

## 2023-11-10 DIAGNOSIS — Z8551 Personal history of malignant neoplasm of bladder: Secondary | ICD-10-CM | POA: Diagnosis not present

## 2023-11-10 LAB — COMPREHENSIVE METABOLIC PANEL
ALT: 10 U/L (ref 0–44)
AST: 17 U/L (ref 15–41)
Albumin: 2.8 g/dL — ABNORMAL LOW (ref 3.5–5.0)
Alkaline Phosphatase: 47 U/L (ref 38–126)
Anion gap: 6 (ref 5–15)
BUN: 29 mg/dL — ABNORMAL HIGH (ref 8–23)
CO2: 27 mmol/L (ref 22–32)
Calcium: 10.9 mg/dL — ABNORMAL HIGH (ref 8.9–10.3)
Chloride: 103 mmol/L (ref 98–111)
Creatinine, Ser: 1.49 mg/dL — ABNORMAL HIGH (ref 0.61–1.24)
GFR, Estimated: 45 mL/min — ABNORMAL LOW (ref >60)
Glucose, Bld: 203 mg/dL — ABNORMAL HIGH (ref 70–99)
Potassium: 3.8 mmol/L (ref 3.5–5.1)
Sodium: 136 mmol/L (ref 135–145)
Total Bilirubin: 0.5 mg/dL (ref <1.2)
Total Protein: 6.2 g/dL — ABNORMAL LOW (ref 6.5–8.1)

## 2023-11-10 LAB — CBC WITH DIFFERENTIAL/PLATELET
Abs Immature Granulocytes: 0.03 10*3/uL (ref 0.00–0.07)
Basophils Absolute: 0 10*3/uL (ref 0.0–0.1)
Basophils Relative: 0 %
Eosinophils Absolute: 0.2 10*3/uL (ref 0.0–0.5)
Eosinophils Relative: 2 %
HCT: 31.1 % — ABNORMAL LOW (ref 39.0–52.0)
Hemoglobin: 9.6 g/dL — ABNORMAL LOW (ref 13.0–17.0)
Immature Granulocytes: 0 %
Lymphocytes Relative: 17 %
Lymphs Abs: 1.6 10*3/uL (ref 0.7–4.0)
MCH: 30 pg (ref 26.0–34.0)
MCHC: 30.9 g/dL (ref 30.0–36.0)
MCV: 97.2 fL (ref 80.0–100.0)
Monocytes Absolute: 0.7 10*3/uL (ref 0.1–1.0)
Monocytes Relative: 8 %
Neutro Abs: 6.8 10*3/uL (ref 1.7–7.7)
Neutrophils Relative %: 73 %
Platelets: 426 10*3/uL — ABNORMAL HIGH (ref 150–400)
RBC: 3.2 MIL/uL — ABNORMAL LOW (ref 4.22–5.81)
RDW: 14.7 % (ref 11.5–15.5)
WBC: 9.4 10*3/uL (ref 4.0–10.5)
nRBC: 0 % (ref 0.0–0.2)

## 2023-11-10 LAB — CBG MONITORING, ED
Glucose-Capillary: 140 mg/dL — ABNORMAL HIGH (ref 70–99)
Glucose-Capillary: 39 mg/dL — CL (ref 70–99)

## 2023-11-10 LAB — URINALYSIS, ROUTINE W REFLEX MICROSCOPIC
Bilirubin Urine: NEGATIVE
Glucose, UA: NEGATIVE mg/dL
Ketones, ur: 5 mg/dL — AB
Leukocytes,Ua: NEGATIVE
Nitrite: NEGATIVE
Protein, ur: 30 mg/dL — AB
Specific Gravity, Urine: 1.014 (ref 1.005–1.030)
pH: 5 (ref 5.0–8.0)

## 2023-11-10 LAB — I-STAT CG4 LACTIC ACID, ED
Lactic Acid, Venous: 0.7 mmol/L (ref 0.5–1.9)
Lactic Acid, Venous: 0.8 mmol/L (ref 0.5–1.9)

## 2023-11-10 MED ORDER — SODIUM CHLORIDE 0.9 % IV BOLUS
1000.0000 mL | Freq: Once | INTRAVENOUS | Status: AC
  Administered 2023-11-10: 1000 mL via INTRAVENOUS

## 2023-11-10 MED ORDER — DEXTROSE 50 % IV SOLN
1.0000 | Freq: Once | INTRAVENOUS | Status: AC
  Administered 2023-11-10: 50 mL via INTRAVENOUS
  Filled 2023-11-10: qty 50

## 2023-11-10 MED ORDER — MIDODRINE HCL 5 MG PO TABS
10.0000 mg | ORAL_TABLET | Freq: Once | ORAL | Status: AC
  Administered 2023-11-10: 10 mg via ORAL
  Filled 2023-11-10: qty 2

## 2023-11-10 NOTE — ED Provider Notes (Signed)
[facility] AT [facility] Provider Note   CSN: [id number] Arrival date & time: 11/10/23  1634     History  Chief Complaint  Patient presents with   Hypotension   Fall   Hypoglycemia    [name] is a 89 y.o. male.  Pt is a 89 yo male with pmhx significant for hld, gout, gerd, dvt (no thinners), cad, dm2, prostate and bladder cancer, chf, and ckd.  Pt has had several witnessed falls today.  Per EMS report, pt did not hit his head.  He had an episode of decreased responsiveness today.  EMS was called and BP was 80/40.  They did given him IVFs en route.  Pt is more alert now, but still not back to his baseline.       Home Medications Prior to Admission medications   Medication Sig Start Date End Date Taking? Authorizing Provider  acetaminophen (TYLENOL) 500 MG tablet Take 1 tablet (500 mg total) by mouth 3 (three) times daily. 11/05/23   Marguerita Merles Latif, DO  aspirin 81 MG chewable tablet Chew 81 mg by mouth every evening.    [provider]  Ca Carbonate-Mag Hydroxide (ROLAIDS PO) Take by mouth as needed (reflux).    [provider]  Cholecalciferol (VITAMIN D-3) 125 MCG (5000 UT) TABS Take 5,000 Units by mouth daily.    [provider]  doxycycline (VIBRA-TABS) 100 MG tablet Take 1 tablet (100 mg total) by mouth every 12 (twelve) hours. 11/05/23   Marguerita Merles Latif, DO  DULoxetine (CYMBALTA) 20 MG capsule Take 20 mg by mouth 2 (two) times daily.    [provider]  Ensure Max Protein (ENSURE MAX PROTEIN) LIQD Take 330 mLs (11 oz total) by mouth daily. 10/01/23   Narda Bonds, MD  famotidine (PEPCID) 20 MG tablet Take 1 tablet (20 mg total) by mouth daily. 11/05/23   Marguerita Merles Latif, DO  gabapentin (NEURONTIN) 100 MG capsule Take 100 mg by mouth every 6 (six) hours as needed (nerve pain).    [provider]  lidocaine (LIDODERM) 5 % Place 1 patch onto the skin as needed. Remove & Discard  patch within 12 hours or as directed by MD    [provider]  metroNIDAZOLE (FLAGYL) 500 MG tablet Take 1 tablet (500 mg total) by mouth every 12 (twelve) hours for 7 days. 11/05/23 11/12/23  Marguerita Merles Latif, DO  midodrine (PROAMATINE) 10 MG tablet Take 1 tablet (10 mg total) by mouth 3 (three) times daily with meals. 11/04/23   Marguerita Merles Latif, DO  Multiple Vitamin (MULTIVITAMIN) tablet Take 1 tablet by mouth at bedtime.    [provider]  nitroGLYCERIN (NITROSTAT) 0.4 MG SL tablet Place 1 tablet (0.4 mg total) under the tongue every 5 (five) minutes as needed for chest pain. 08/26/23   Christell Constant, MD  nutrition supplement, JUVEN, (JUVEN) PACK Take 1 packet by mouth 2 (two) times daily between meals. 09/30/23   Narda Bonds, MD  nystatin cream (MYCOSTATIN) Apply 1 Application topically 3 (three) times daily.    [provider]  ondansetron (ZOFRAN) 4 MG tablet Take 4 mg by mouth every 6 (six) hours as needed for nausea or vomiting.    [provider]  polyethylene glycol (MIRALAX / GLYCOLAX) 17 g packet Take 17 g by mouth daily as needed for mild constipation. 09/30/23   Narda Bonds, MD  Propylene Glycol (SYSTANE COMPLETE) 0.6 % SOLN Place  1 drop into the left eye 2 (two) times daily.    [provider]  psyllium (HYDROCIL/METAMUCIL) 95 % PACK Take 1 packet by mouth daily as needed for mild constipation.    [provider]  tamsulosin (FLOMAX) 0.4 MG CAPS capsule Take 0.4 mg by mouth 2 (two) times daily.    [provider]      Allergies    Fish allergy, Lipitor [atorvastatin], Penicillins, Simvastatin, Adacel [tetanus-diphth-acell pertussis], Lasix [furosemide], Septra [sulfamethoxazole-trimethoprim], Flexeril [cyclobenzaprine], and Pantoprazole    Review of Systems   Review of Systems  Neurological:  Positive for weakness.  All other systems reviewed and are negative.   Physical Exam Updated Vital  Signs BP 124/65   Pulse 76   Temp 98.4 F (36.9 C)   Resp 18   SpO2 99%  Physical Exam Vitals and nursing note reviewed.  Constitutional:      Appearance: Normal appearance.  HENT:     Head: Normocephalic and atraumatic.     Right Ear: External ear normal.     Left Ear: External ear normal.     Nose: Nose normal.     Mouth/Throat:     Mouth: Mucous membranes are dry.  Eyes:     Extraocular Movements: Extraocular movements intact.     Conjunctiva/sclera: Conjunctivae normal.     Pupils: Pupils are equal, round, and reactive to light.  Cardiovascular:     Rate and Rhythm: Normal rate and regular rhythm.     Pulses: Normal pulses.     Heart sounds: Normal heart sounds.  Pulmonary:     Effort: Pulmonary effort is normal.     Breath sounds: Normal breath sounds.  Abdominal:     General: Abdomen is flat. Bowel sounds are normal.     Palpations: Abdomen is soft.  Musculoskeletal:        General: Normal range of motion.     Cervical back: Normal range of motion and neck supple.  Skin:    General: Skin is warm.     Capillary Refill: Capillary refill takes less than 2 seconds.  Neurological:     General: No focal deficit present.     Mental Status: He is alert. He is disoriented.  Psychiatric:        Mood and Affect: Mood normal.        Behavior: Behavior normal.     ED Results / Procedures / Treatments   Labs (all labs ordered are listed, but only abnormal results are displayed) Labs Reviewed  CBC WITH DIFFERENTIAL/PLATELET - Abnormal; Notable for the following components:      Result Value   RBC 3.20 (*)    Hemoglobin 9.6 (*)    HCT 31.1 (*)    Platelets 426 (*)    All other components within normal limits  COMPREHENSIVE METABOLIC PANEL - Abnormal; Notable for the following components:   Glucose, Bld 203 (*)    BUN 29 (*)    Creatinine, Ser 1.49 (*)    Calcium 10.9 (*)    Total Protein 6.2 (*)    Albumin 2.8 (*)    GFR, Estimated 45 (*)    All other  components within normal limits  URINALYSIS, ROUTINE W REFLEX MICROSCOPIC - Abnormal; Notable for the following components:   Hgb urine dipstick SMALL (*)    Ketones, ur 5 (*)    Protein, ur 30 (*)    Bacteria, UA RARE (*)    All other components within normal limits  CBG MONITORING, ED - Abnormal; Notable for the following components:   Glucose-Capillary 39 (*)    All other components within normal limits  CBG MONITORING, ED - Abnormal; Notable for the following components:   Glucose-Capillary 140 (*)    All other components within normal limits  CULTURE, BLOOD (ROUTINE X 2)  CULTURE, BLOOD (ROUTINE X 2)  URINE CULTURE  I-STAT CG4 LACTIC ACID, ED  I-STAT CG4 LACTIC ACID, ED    EKG EKG Interpretation Date/Time:  Wednesday November 10 2023 16:45:26 EST Ventricular Rate:  76 PR Interval:  177 QRS Duration:  178 QT Interval:  392 QTC Calculation: 441 R Axis:   -44  Text Interpretation: Sinus rhythm Left bundle branch block No significant change since last tracing Confirmed by Jacalyn Lefevre [phone]) on 11/10/2023 4:48:16 PM  Radiology CT Head Wo Contrast  Result Date: 11/10/2023 CLINICAL DATA:  Mental status change, unknown cause Two falls today. EXAM: CT HEAD WITHOUT CONTRAST TECHNIQUE: Contiguous axial images were obtained from the base of the skull through the vertex without intravenous contrast. RADIATION DOSE REDUCTION: This exam was performed according to the departmental dose-optimization program which includes automated exposure control, adjustment of the mA and/or kV according to patient size and/or use of iterative reconstruction technique. COMPARISON:  Most recent head CT 10/28/2023 FINDINGS: Brain: No intracranial hemorrhage, mass effect, or midline shift. Stable atrophy and chronic small vessel ischemia. No hydrocephalus. The basilar cisterns are patent. No evidence of territorial infarct or acute ischemia. No extra-axial or intracranial fluid collection. Vascular:  Atherosclerosis of skullbase vasculature without hyperdense vessel or abnormal calcification. Skull: No fracture or focal lesion. Sinuses/Orbits: No acute finding.  Bilateral cataract resection Other: No confluent scalp hematoma. IMPRESSION: 1. No acute intracranial abnormality. No skull fracture. 2. Stable atrophy and chronic small vessel ischemia. Electronically Signed   By: Narda Rutherford M.D.   On: 11/10/2023 20:37   DG Chest Portable 1 View  Result Date: 11/10/2023 CLINICAL DATA:  Hypotension, multiple falls, altered level of consciousness EXAM: PORTABLE CHEST 1 VIEW COMPARISON:  11/05/2023 FINDINGS: Single frontal view of the chest demonstrate postsurgical changes from CABG and aortic valve replacement. Cardiac silhouette is stable. No acute airspace disease, effusion, or pneumothorax. No acute bony abnormalities. IMPRESSION: 1. No acute intrathoracic process. Electronically Signed   By: Sharlet Salina M.D.   On: 11/10/2023 20:16    Procedures Procedures    Medications Ordered in ED Medications  midodrine (PROAMATINE) tablet 10 mg (has no administration in time range)  sodium chloride 0.9 % bolus 1,000 mL (0 mLs Intravenous Stopped 11/10/23 1908)  dextrose 50 % solution 50 mL (50 mLs Intravenous Given 11/10/23 1659)    ED Course/ Medical Decision Making/ A&P                                 Medical Decision Making Amount and/or Complexity of Data Reviewed Labs: ordered. Radiology: ordered.  Risk Prescription drug management.   This patient presents to the ED for concern of AMS, this involves an extensive number of treatment options, and is a complaint that carries with it a high risk of complications and morbidity.  The differential diagnosis includes hypoglycemia, infection, head injury, electrolyte abn   Co morbidities that complicate the patient evaluation  hld, gout, gerd, dvt (no thinners), cad, dm2, prostate and bladder cancer, chf, and ckd   Additional history  obtained:  Additional history obtained from epic chart review External  records from outside source obtained and reviewed including EMS report   Lab Tests:  I Ordered, and personally interpreted labs.  The pertinent results include:  initial cbg 39, cbc with hgb low at 9.6 (chronic); cmp with bun 29 and cr 1.49 (cr 1.06 5 days ago)   Imaging Studies ordered:  I ordered imaging studies including cxr, ct head  I independently visualized and interpreted imaging which showed  CXR: 1. No acute intrathoracic process.  CT head:  No acute intracranial abnormality. No skull fracture.  2. Stable atrophy and chronic small vessel ischemia.   I agree with the radiologist interpretation   Cardiac Monitoring:  The patient was maintained on a cardiac monitor.  I personally viewed and interpreted the cardiac monitored which showed an underlying rhythm of: nsr   Medicines ordered and prescription drug management:  I ordered medication including ivfs  for hypotension  Reevaluation of the patient after these medicines showed that the patient improved I have reviewed the patients home medicines and have made adjustments as needed   Test Considered:  ct   Critical Interventions:  Ivfs/sugar   Problem List / ED Course:  Hypoglycemia:  resolved after d50 and food.  Family reports that he has not been eating/drinking much.  Pt encouraged to eat/drink. AKI:  likely due to dehydration.   Amb dysfunction:  pt has not been walking much since he had surgery in September.  He has hardly been out of bed.  Hypotension:  pt has had chronic hypotension.  He is supposed to be on Midodrine.  It is unclear if he's been taking it.  SNF is encouraged to give it as scheduled.  He is given a dose prior to d/c.    Reevaluation:  After the interventions noted above, I reevaluated the patient and found that they have :improved   Social Determinants of Health:  Lives in a  facility   Dispostion:  After consideration of the diagnostic results and the patients response to treatment, I feel that the patent would benefit from discharge with outpatient f/u.          Final Clinical Impression(s) / ED Diagnoses Final diagnoses:  Dehydration  AKI (acute kidney injury) (HCC)  Hypoglycemia    Rx / DC Orders ED Discharge Orders     None         Jacalyn Lefevre, MD 11/10/23 2111

## 2023-11-10 NOTE — ED Notes (Signed)
Cultures drawn from left hand

## 2023-11-10 NOTE — ED Notes (Signed)
Unsuccessful attempt to contact [facility] for report.

## 2023-11-10 NOTE — ED Notes (Signed)
Maggie, rn received report

## 2023-11-10 NOTE — Discharge Instructions (Addendum)
Please make sure pt is receiving his Midodrine as scheduled. Also encourage pt to eat/drink.

## 2023-11-10 NOTE — ED Notes (Signed)
Repeat CBG 140.

## 2023-11-10 NOTE — ED Triage Notes (Addendum)
Pt BIBA from [facility]. Pt had 2x falls today. BP was 80/40 on EMS arrival. BP improved to 104/60 after fluids were given.  Aox4  CBG 39 on arrival to ED

## 2023-11-11 LAB — URINE CULTURE: Culture: NO GROWTH

## 2023-11-12 LAB — BLOOD CULTURE ID PANEL (REFLEXED) - BCID2

## 2023-11-12 NOTE — ED Notes (Signed)
Positive blood culture result given to Dr. Jacqulyn Bath. Staph epi positive in 1 of 4 culture bottles. Gene resistance to mec a/c.

## 2023-11-13 LAB — CULTURE, BLOOD (ROUTINE X 2): Special Requests: ADEQUATE

## 2023-11-14 ENCOUNTER — Telehealth (HOSPITAL_BASED_OUTPATIENT_CLINIC_OR_DEPARTMENT_OTHER): Payer: Self-pay | Admitting: *Deleted

## 2023-11-14 NOTE — Telephone Encounter (Signed)
Post ED Visit - Positive Culture Follow-up  Culture report reviewed by antimicrobial stewardship pharmacist: [facility] [address], Pharm.D. []  Celedonio Miyamoto, Pharm.D., BCPS AQ-ID []  Garvin Fila, Pharm.D., BCPS []  Georgina Pillion, Pharm.D., BCPS []  [place], [place].D., BCPS, AAHIVP []  Estella Husk, Pharm.D., BCPS, AAHIVP []  Lysle Pearl, PharmD, BCPS []  Phillips Climes, PharmD, BCPS []  Agapito Games, PharmD, BCPS []  Verlan Friends, PharmD []  Mervyn Gay, PharmD, BCPS []  Vinnie Level, PharmD  [facility] []  Len Childs, PharmD []  Greer Pickerel, PharmD []  Adalberto Cole, PharmD []  Perlie Gold, Rph []  Lonell Face) Jean Rosenthal, PharmD []  Earl Many, PharmD []  Junita Push, PharmD []  Dorna Leitz, PharmD []  Terrilee Files, PharmD []  Lynann Beaver, PharmD []  Keturah Barre, PharmD []  Loralee Pacas, PharmD [x]  Laureen Ochs, PharmD   Positive blood culture MD previously made aware. Likely contaminant and no further patient follow-up is required at this time.  [name] 11/14/2023, 8:17 AM

## 2023-11-15 LAB — CULTURE, BLOOD (ROUTINE X 2): Culture: NO GROWTH

## 2023-11-18 ENCOUNTER — Emergency Department (HOSPITAL_COMMUNITY): Payer: PPO

## 2023-11-18 ENCOUNTER — Encounter (HOSPITAL_COMMUNITY): Payer: Self-pay

## 2023-11-18 ENCOUNTER — Inpatient Hospital Stay (HOSPITAL_COMMUNITY)
Admission: EM | Admit: 2023-11-18 | Discharge: 2023-11-23 | DRG: 872 | Disposition: A | Discharge status: Hospice | Payer: PPO | Source: Skilled Nursing Facility | Attending: Internal Medicine | Admitting: Internal Medicine

## 2023-11-18 ENCOUNTER — Other Ambulatory Visit: Payer: Self-pay

## 2023-11-18 DIAGNOSIS — F419 Anxiety disorder, unspecified: Secondary | ICD-10-CM | POA: Diagnosis present

## 2023-11-18 DIAGNOSIS — Z860101 Personal history of adenomatous and serrated colon polyps: Secondary | ICD-10-CM

## 2023-11-18 DIAGNOSIS — M1A9XX0 Chronic gout, unspecified, without tophus (tophi): Secondary | ICD-10-CM | POA: Diagnosis present

## 2023-11-18 DIAGNOSIS — Z7189 Other specified counseling: Secondary | ICD-10-CM

## 2023-11-18 DIAGNOSIS — R6520 Severe sepsis without septic shock: Secondary | ICD-10-CM | POA: Diagnosis present

## 2023-11-18 DIAGNOSIS — Z66 Do not resuscitate: Secondary | ICD-10-CM | POA: Diagnosis present

## 2023-11-18 DIAGNOSIS — T8141XA Infection following a procedure, superficial incisional surgical site, initial encounter: Secondary | ICD-10-CM | POA: Diagnosis present

## 2023-11-18 DIAGNOSIS — I130 Hypertensive heart and chronic kidney disease with heart failure and stage 1 through stage 4 chronic kidney disease, or unspecified chronic kidney disease: Secondary | ICD-10-CM | POA: Diagnosis present

## 2023-11-18 DIAGNOSIS — A419 Sepsis, unspecified organism: Secondary | ICD-10-CM | POA: Diagnosis not present

## 2023-11-18 DIAGNOSIS — N2581 Secondary hyperparathyroidism of renal origin: Secondary | ICD-10-CM | POA: Diagnosis present

## 2023-11-18 DIAGNOSIS — K219 Gastro-esophageal reflux disease without esophagitis: Secondary | ICD-10-CM | POA: Diagnosis present

## 2023-11-18 DIAGNOSIS — E871 Hypo-osmolality and hyponatremia: Secondary | ICD-10-CM | POA: Diagnosis present

## 2023-11-18 DIAGNOSIS — R400 Somnolence: Secondary | ICD-10-CM

## 2023-11-18 DIAGNOSIS — M51379 Other intervertebral disc degeneration, lumbosacral region without mention of lumbar back pain or lower extremity pain: Secondary | ICD-10-CM | POA: Diagnosis present

## 2023-11-18 DIAGNOSIS — Z91040 Latex allergy status: Secondary | ICD-10-CM

## 2023-11-18 DIAGNOSIS — E860 Dehydration: Secondary | ICD-10-CM | POA: Diagnosis present

## 2023-11-18 DIAGNOSIS — Z8546 Personal history of malignant neoplasm of prostate: Secondary | ICD-10-CM

## 2023-11-18 DIAGNOSIS — A4152 Sepsis due to Pseudomonas: Principal | ICD-10-CM | POA: Diagnosis present

## 2023-11-18 DIAGNOSIS — N179 Acute kidney failure, unspecified: Secondary | ICD-10-CM | POA: Diagnosis present

## 2023-11-18 DIAGNOSIS — E1142 Type 2 diabetes mellitus with diabetic polyneuropathy: Secondary | ICD-10-CM | POA: Diagnosis present

## 2023-11-18 DIAGNOSIS — Z515 Encounter for palliative care: Secondary | ICD-10-CM | POA: Diagnosis not present

## 2023-11-18 DIAGNOSIS — E785 Hyperlipidemia, unspecified: Secondary | ICD-10-CM | POA: Diagnosis present

## 2023-11-18 DIAGNOSIS — E870 Hyperosmolality and hypernatremia: Secondary | ICD-10-CM | POA: Diagnosis present

## 2023-11-18 DIAGNOSIS — I2510 Atherosclerotic heart disease of native coronary artery without angina pectoris: Secondary | ICD-10-CM | POA: Diagnosis present

## 2023-11-18 DIAGNOSIS — Z6821 Body mass index (BMI) 21.0-21.9, adult: Secondary | ICD-10-CM

## 2023-11-18 DIAGNOSIS — N400 Enlarged prostate without lower urinary tract symptoms: Secondary | ICD-10-CM | POA: Diagnosis present

## 2023-11-18 DIAGNOSIS — Z79891 Long term (current) use of opiate analgesic: Secondary | ICD-10-CM

## 2023-11-18 DIAGNOSIS — N1831 Chronic kidney disease, stage 3a: Secondary | ICD-10-CM | POA: Diagnosis present

## 2023-11-18 DIAGNOSIS — Z952 Presence of prosthetic heart valve: Secondary | ICD-10-CM

## 2023-11-18 DIAGNOSIS — E1122 Type 2 diabetes mellitus with diabetic chronic kidney disease: Secondary | ICD-10-CM | POA: Diagnosis present

## 2023-11-18 DIAGNOSIS — I429 Cardiomyopathy, unspecified: Secondary | ICD-10-CM | POA: Diagnosis present

## 2023-11-18 DIAGNOSIS — Z7982 Long term (current) use of aspirin: Secondary | ICD-10-CM

## 2023-11-18 DIAGNOSIS — R64 Cachexia: Secondary | ICD-10-CM | POA: Diagnosis present

## 2023-11-18 DIAGNOSIS — D631 Anemia in chronic kidney disease: Secondary | ICD-10-CM | POA: Diagnosis present

## 2023-11-18 DIAGNOSIS — Z86718 Personal history of other venous thrombosis and embolism: Secondary | ICD-10-CM

## 2023-11-18 DIAGNOSIS — Z91013 Allergy to seafood: Secondary | ICD-10-CM

## 2023-11-18 DIAGNOSIS — Z85528 Personal history of other malignant neoplasm of kidney: Secondary | ICD-10-CM

## 2023-11-18 DIAGNOSIS — L89152 Pressure ulcer of sacral region, stage 2: Secondary | ICD-10-CM | POA: Diagnosis present

## 2023-11-18 DIAGNOSIS — I350 Nonrheumatic aortic (valve) stenosis: Secondary | ICD-10-CM | POA: Diagnosis present

## 2023-11-18 DIAGNOSIS — Z8551 Personal history of malignant neoplasm of bladder: Secondary | ICD-10-CM

## 2023-11-18 DIAGNOSIS — N390 Urinary tract infection, site not specified: Secondary | ICD-10-CM | POA: Diagnosis present

## 2023-11-18 DIAGNOSIS — T8131XD Disruption of external operation (surgical) wound, not elsewhere classified, subsequent encounter: Secondary | ICD-10-CM

## 2023-11-18 DIAGNOSIS — G8929 Other chronic pain: Secondary | ICD-10-CM | POA: Diagnosis present

## 2023-11-18 DIAGNOSIS — Z79899 Other long term (current) drug therapy: Secondary | ICD-10-CM | POA: Diagnosis not present

## 2023-11-18 DIAGNOSIS — Z887 Allergy status to serum and vaccine status: Secondary | ICD-10-CM

## 2023-11-18 DIAGNOSIS — Z1152 Encounter for screening for COVID-19: Secondary | ICD-10-CM

## 2023-11-18 DIAGNOSIS — Z8672 Personal history of thrombophlebitis: Secondary | ICD-10-CM

## 2023-11-18 DIAGNOSIS — Z905 Acquired absence of kidney: Secondary | ICD-10-CM

## 2023-11-18 DIAGNOSIS — I5022 Chronic systolic (congestive) heart failure: Secondary | ICD-10-CM | POA: Diagnosis present

## 2023-11-18 DIAGNOSIS — Z951 Presence of aortocoronary bypass graft: Secondary | ICD-10-CM

## 2023-11-18 DIAGNOSIS — Z882 Allergy status to sulfonamides status: Secondary | ICD-10-CM

## 2023-11-18 DIAGNOSIS — Z87891 Personal history of nicotine dependence: Secondary | ICD-10-CM

## 2023-11-18 DIAGNOSIS — R319 Hematuria, unspecified: Secondary | ICD-10-CM | POA: Diagnosis present

## 2023-11-18 DIAGNOSIS — R54 Age-related physical debility: Secondary | ICD-10-CM | POA: Diagnosis present

## 2023-11-18 DIAGNOSIS — K5909 Other constipation: Secondary | ICD-10-CM | POA: Diagnosis present

## 2023-11-18 DIAGNOSIS — Z888 Allergy status to other drugs, medicaments and biological substances status: Secondary | ICD-10-CM

## 2023-11-18 DIAGNOSIS — Z880 Allergy status to penicillin: Secondary | ICD-10-CM

## 2023-11-18 DIAGNOSIS — Z9861 Coronary angioplasty status: Secondary | ICD-10-CM

## 2023-11-18 LAB — COMPREHENSIVE METABOLIC PANEL
ALT: 13 U/L (ref 0–44)
AST: 23 U/L (ref 15–41)
Albumin: 2.8 g/dL — ABNORMAL LOW (ref 3.5–5.0)
Alkaline Phosphatase: 62 U/L (ref 38–126)
Anion gap: 10 (ref 5–15)
BUN: 56 mg/dL — ABNORMAL HIGH (ref 8–23)
CO2: 25 mmol/L (ref 22–32)
Calcium: 13.5 mg/dL (ref 8.9–10.3)
Chloride: 112 mmol/L — ABNORMAL HIGH (ref 98–111)
Creatinine, Ser: 1.83 mg/dL — ABNORMAL HIGH (ref 0.61–1.24)
GFR, Estimated: 35 mL/min — ABNORMAL LOW (ref >60)
Glucose, Bld: 185 mg/dL — ABNORMAL HIGH (ref 70–99)
Potassium: 3.7 mmol/L (ref 3.5–5.1)
Sodium: 147 mmol/L — ABNORMAL HIGH (ref 135–145)
Total Bilirubin: 0.4 mg/dL (ref <1.2)
Total Protein: 7.5 g/dL (ref 6.5–8.1)

## 2023-11-18 LAB — CBC WITH DIFFERENTIAL/PLATELET
Abs Immature Granulocytes: 0.05 10*3/uL (ref 0.00–0.07)
Basophils Absolute: 0.1 10*3/uL (ref 0.0–0.1)
Basophils Relative: 0 %
Eosinophils Absolute: 0.3 10*3/uL (ref 0.0–0.5)
Eosinophils Relative: 2 %
HCT: 34.7 % — ABNORMAL LOW (ref 39.0–52.0)
Hemoglobin: 11 g/dL — ABNORMAL LOW (ref 13.0–17.0)
Immature Granulocytes: 0 %
Lymphocytes Relative: 22 %
Lymphs Abs: 2.7 10*3/uL (ref 0.7–4.0)
MCH: 30.3 pg (ref 26.0–34.0)
MCHC: 31.7 g/dL (ref 30.0–36.0)
MCV: 95.6 fL (ref 80.0–100.0)
Monocytes Absolute: 0.9 10*3/uL (ref 0.1–1.0)
Monocytes Relative: 7 %
Neutro Abs: 8.6 10*3/uL — ABNORMAL HIGH (ref 1.7–7.7)
Neutrophils Relative %: 69 %
Platelets: 446 10*3/uL — ABNORMAL HIGH (ref 150–400)
RBC: 3.63 MIL/uL — ABNORMAL LOW (ref 4.22–5.81)
RDW: 15.1 % (ref 11.5–15.5)
WBC: 12.7 10*3/uL — ABNORMAL HIGH (ref 4.0–10.5)
nRBC: 0 % (ref 0.0–0.2)

## 2023-11-18 LAB — PROCALCITONIN: Procalcitonin: 0.26 ng/mL

## 2023-11-18 LAB — URINALYSIS, W/ REFLEX TO CULTURE (INFECTION SUSPECTED)
Bilirubin Urine: NEGATIVE
Glucose, UA: NEGATIVE mg/dL
Ketones, ur: NEGATIVE mg/dL
Nitrite: POSITIVE — AB
Protein, ur: 100 mg/dL — AB
RBC / HPF: >50 RBC/hpf (ref 0–5)
Specific Gravity, Urine: 1.018 (ref 1.005–1.030)
WBC, UA: >50 WBC/hpf (ref 0–5)
pH: 5 (ref 5.0–8.0)

## 2023-11-18 LAB — RESP PANEL BY RT-PCR (RSV, FLU A&B, COVID)  RVPGX2
Influenza A by PCR: NEGATIVE
Influenza B by PCR: NEGATIVE
Resp Syncytial Virus by PCR: NEGATIVE
SARS Coronavirus 2 by RT PCR: NEGATIVE

## 2023-11-18 LAB — GLUCOSE, CAPILLARY
Glucose-Capillary: 160 mg/dL — ABNORMAL HIGH (ref 70–99)
Glucose-Capillary: 144 mg/dL — ABNORMAL HIGH (ref 70–99)

## 2023-11-18 LAB — APTT: aPTT: 35 s (ref 24–36)

## 2023-11-18 LAB — PROTIME-INR
INR: 1.3 — ABNORMAL HIGH (ref 0.8–1.2)
Prothrombin Time: 16.5 s — ABNORMAL HIGH (ref 11.4–15.2)

## 2023-11-18 LAB — CK: Total CK: 157 U/L (ref 49–397)

## 2023-11-18 LAB — I-STAT CG4 LACTIC ACID, ED: Lactic Acid, Venous: 1.3 mmol/L (ref 0.5–1.9)

## 2023-11-18 MED ORDER — LACTATED RINGERS IV SOLN: INTRAVENOUS | Status: AC

## 2023-11-18 MED ORDER — LACTATED RINGERS IV BOLUS
500.0000 mL | Freq: Once | INTRAVENOUS | Status: AC
  Administered 2023-11-18: 500 mL via INTRAVENOUS

## 2023-11-18 MED ORDER — LACTATED RINGERS IV SOLN: INTRAVENOUS | Status: DC

## 2023-11-18 MED ORDER — CEFTRIAXONE SODIUM 2 G IJ SOLR
2.0000 g | INTRAMUSCULAR | Status: DC
  Administered 2023-11-18 – 2023-11-19 (×2): 2 g via INTRAVENOUS
  Filled 2023-11-18 (×2): qty 20

## 2023-11-18 MED ORDER — ENOXAPARIN SODIUM 30 MG/0.3ML IJ SOSY
30.0000 mg | PREFILLED_SYRINGE | INTRAMUSCULAR | Status: DC
  Administered 2023-11-18 – 2023-11-22 (×5): 30 mg via SUBCUTANEOUS
  Filled 2023-11-18 (×5): qty 0.3

## 2023-11-18 NOTE — ED Notes (Signed)
ED TO INPATIENT HANDOFF REPORT  Name/Age/Gender [name] 89 y.o. male  Code Status    Code Status Orders  (From admission, onward)           Start     Ordered   11/18/23 1515  Do not attempt resuscitation (DNR)- Limited -Do Not Intubate (DNI)  (Code Status)  Continuous       Question Answer Comment  If pulseless and not breathing No CPR or chest compressions.   In Pre-Arrest Conditions (Patient Is Breathing and Has A Pulse) Do not intubate. Provide all appropriate non-invasive medical interventions. Avoid ICU transfer unless indicated or required.   Consent: Discussion documented in EHR or advanced directives reviewed      11/18/23 1515           Code Status History     Date Active Date Inactive Code Status Order ID Comments User Context   10/28/2023 2100 11/05/2023 1708 Full Code [id number]  John Giovanni, MD ED   09/24/2023 0033 09/30/2023 1938 Full Code [id number]  Anselm Jungling, DO ED   09/01/2023 1108 09/01/2023 1848 Full Code [id number]  Corky Crafts, MD Inpatient   06/22/2017 1043 06/24/2017 1659 Full Code [id number]  Tonny Bollman, MD Inpatient   11/03/2016 0852 11/03/2016 1547 Full Code [id number]  Lyn Records, MD Inpatient   12/22/2015 1446 12/24/2015 1509 Full Code [id number]  Meredith Pel, NP Inpatient   10/26/2015 0332 10/31/2015 1901 Full Code [id number]  Ron Parker, MD Inpatient       Home/SNF/Other Nursing Home  Chief Complaint AKI (acute kidney injury) (HCC) [N17.9]  Level of Care/Admitting Diagnosis ED Disposition     ED Disposition  Admit   Condition  --   Comment  Hospital Area: [facility] [[id number]]  Level of Care: Progressive [102]  Admit to Progressive based on following criteria: MULTISYSTEM THREATS such as stable sepsis, metabolic/electrolyte imbalance with or without encephalopathy that is responding to early treatment.  May admit patient to [facility] or [facility] if equivalent level of care is  available:: Yes  Covid Evaluation: Asymptomatic - no recent exposure (last 10 days) testing not required  Diagnosis: AKI (acute kidney injury) [facility]) [[id number]]  Admitting Physician: Alwyn Ren [[id number]]  Attending Physician: Alwyn Ren [[id number]]  Certification:: I certify this patient will need inpatient services for at least 2 midnights  Expected Medical Readiness: 11/23/2023          Medical History Past Medical History:  Diagnosis Date   Anemia due to chronic kidney disease    Benign localized prostatic hyperplasia with lower urinary tract symptoms (LUTS)    Bladder cancer (HCC) 04/2023   dr dahlstedt   Carpal tunnel syndrome on both sides    Chronic constipation    Chronic gout without tophus    followed by pcp    (05-07-2023  per pt last flare-up 6-8 months ago, usually affects bilateral great toes)   Chronic low back pain    per pt s/p RFA's   Chronic narcotic use    Chronic systolic CHF (congestive heart failure) (HCC)    followed by cardiology;  preserved ef   CKD (chronic kidney disease), stage III (HCC) 2017   previously seen by nephrologsit--- dr Hyman Hopes   Coronary artery disease 1993   cardiologist--- dr h. Katrinka Blazing;   1993-- s/p cath w/ PTCA to occluded RCA;  a. 2007: s/p CABG x4 (LIMA--> LAD, rSVG--> RI, rSVG-seq -->dRCA and PLA))  c.  2017: LHC with 2/3 patent bypass grafts with occuluded SVG--> RI   DDD (degenerative disc disease), lumbosacral    GERD (gastroesophageal reflux disease)    History of adenomatous polyp of colon    History of cardiomyopathy 09/2016   in setting severe aortic stenosis and new acute on chronic systolic CHF, ef 16-10%   History of DVT of lower extremity 2008   superficial thrombophlebitis-- R Lower leg, right upper leg   History of polymyalgia rheumatica 2011   per pt hx was treated and resolved after being dx approx 2011   History of prostate cancer 2009   urologist--- dr Retta Diones;   dx 2009,  Gleason 4+3;   02-08-2009   s/p radioactive prostate seed implants by dr Vonita Moss  (05-07-2023  PSA undetectable)   History of renal cell cancer 2014   primary urologist-- dr Frederich Cha;   incidental finding on imaging for back , left renal mass;   08-31-2013  s/p partial left nephrecotmy   History of septic shock 2016   post op  lumbar back surgery, positive blood culture  (discitis/ ostomyelitis)   Hyperlipidemia    Hypertension    Peripheral neuropathy    hands and feet   Pulmonary nodule, right    solitary ,  last Chest CT in epic 12-08-2018 stable   S/P aortic valve replacement with prosthetic valve 07/02/2017   @[facility]  by Dr Laneta Simmers w/ Stephani [employer] prosthesis  for severe aortic valve stenosis  (nonrheumatic)   Secondary hyperparathyroidism of renal origin (HCC)    Type 2 diabetes mellitus (HCC)    followed by pcp;   (05-07-2023  per pt only check blood sugar occasionlly)   Wears partial dentures    upper    Allergies Allergies  Allergen Reactions   Fish Allergy Swelling    Dark fish- salmon, tuna    Lipitor [Atorvastatin] Other (See Comments)    Muscle pain   Penicillins Swelling    SWELLING OF THE FEET  Has patient had a PCN reaction causing immediate rash, facial/tongue/throat swelling, SOB or lightheadedness with hypotension: no Has patient had a PCN reaction causing severe rash involving mucus membranes or skin necrosis: no Has patient had a PCN reaction that required hospitalization no Has patient had a PCN reaction occurring within the last 10 years: no If all of the above answers are "NO", then may proceed with Cephalosporin use.   Simvastatin Other (See Comments)    MUSCLE PAIN   Adacel [Tetanus-Diphth-Acell Pertussis]     UNSPECIFIED REACTION    Lasix [Furosemide] Swelling    SWELLING REACTION UNSPECIFIED    Septra [Sulfamethoxazole-Trimethoprim]     UNSPECIFIED REACTION    Flexeril [Cyclobenzaprine] Other (See Comments)    Causes confusion   Pantoprazole Diarrhea    IV  Location/Drains/Wounds Patient Lines/Drains/Airways Status     Active Line/Drains/Airways     Name Placement date Placement time Site Days   Peripheral IV 11/18/23 18 G Left Antecubital 11/18/23  1302  Antecubital  less than 1   Peripheral IV 11/18/23 22 G 1" Right Antecubital 11/18/23  1322  Antecubital  less than 1   Incision - 5 Ports Abdomen 1: Umbilicus;Superior 2: Left;Medial;Upper 3: Left;Medial 4: Left;Mid;Lateral 5: Left;Lateral;Lower 08/31/13  0810  -- 3731   Wound / Incision (Open or Dehisced) 09/24/23 Incision - Open Vertebral column Medial 09/24/23  0230  Vertebral column  55            Labs/Imaging Results for orders placed or performed during  the hospital encounter of 11/18/23 (from the past 48 hour(s))  Urinalysis, w/ Reflex to Culture (Infection Suspected) -Urine, Catheterized     Status: Abnormal   Collection Time: 11/18/23 12:56 PM  Result Value Ref Range   Specimen Source URINE, CATHETERIZED    Color, Urine BROWN (A) YELLOW   APPearance TURBID (A) CLEAR   Specific Gravity, Urine 1.018 1.005 - 1.030   pH 5.0 5.0 - 8.0   Glucose, UA NEGATIVE NEGATIVE mg/dL   Hgb urine dipstick MODERATE (A) NEGATIVE   Bilirubin Urine NEGATIVE NEGATIVE   Ketones, ur NEGATIVE NEGATIVE mg/dL   Protein, ur 629 (A) NEGATIVE mg/dL   Nitrite POSITIVE (A) NEGATIVE   Leukocytes,Ua SMALL (A) NEGATIVE   RBC / HPF >50 0 - 5 RBC/hpf   WBC, UA >50 0 - 5 WBC/hpf    Comment:        Reflex urine culture not performed if WBC <=10, OR if Squamous epithelial cells >5. If Squamous epithelial cells >5 suggest recollection.    Bacteria, UA MANY (A) NONE SEEN   Squamous Epithelial / HPF 0-5 0 - 5 /HPF   Mucus PRESENT     Comment: Performed at [facility], [address], [place], [place] [address]  Comprehensive metabolic panel     Status: Abnormal   Collection Time: 11/18/23  1:18 PM  Result Value Ref Range   Sodium 147 (H) 135 - 145 mmol/L   Potassium 3.7 3.5 - 5.1  mmol/L   Chloride 112 (H) 98 - 111 mmol/L   CO2 25 22 - 32 mmol/L   Glucose, Bld 185 (H) 70 - 99 mg/dL    Comment: Glucose reference range applies only to samples taken after fasting for at least 8 hours.   BUN 56 (H) 8 - 23 mg/dL   Creatinine, Ser 3.24 (H) 0.61 - 1.24 mg/dL   Calcium 40.1 (HH) 8.9 - 10.3 mg/dL    Comment: CRITICAL RESULT CALLED TO, READ BACK BY AND VERIFIED WITH Noe Gens, Z RN AT 1353 11/18/23 TUCKER, J    Total Protein 7.5 6.5 - 8.1 g/dL   Albumin 2.8 (L) 3.5 - 5.0 g/dL   AST 23 15 - 41 U/L   ALT 13 0 - 44 U/L   Alkaline Phosphatase 62 38 - 126 U/L   Total Bilirubin 0.4 <1.2 mg/dL   GFR, Estimated 35 (L) >60 mL/min    Comment: (NOTE) Calculated using the CKD-EPI Creatinine Equation (2021)    Anion gap 10 5 - 15    Comment: Performed at [facility], [address] [place], [place] [address]  CBC with Differential     Status: Abnormal   Collection Time: 11/18/23  1:18 PM  Result Value Ref Range   WBC 12.7 (H) 4.0 - 10.5 K/uL   RBC 3.63 (L) 4.22 - 5.81 MIL/uL   Hemoglobin 11.0 (L) 13.0 - 17.0 g/dL   HCT 36.6 (L) 44.0 - 34.7 %   MCV 95.6 80.0 - 100.0 fL   MCH 30.3 26.0 - 34.0 pg   MCHC 31.7 30.0 - 36.0 g/dL   RDW 42.5 95.6 - 38.7 %   Platelets 446 (H) 150 - 400 K/uL   nRBC 0.0 0.0 - 0.2 %   Neutrophils Relative % 69 %   Neutro Abs 8.6 (H) 1.7 - 7.7 K/uL   Lymphocytes Relative 22 %   Lymphs Abs 2.7 0.7 - 4.0 K/uL   Monocytes Relative 7 %   Monocytes Absolute 0.9  0.1 - 1.0 K/uL   Eosinophils Relative 2 %   Eosinophils Absolute 0.3 0.0 - 0.5 K/uL   Basophils Relative 0 %   Basophils Absolute 0.1 0.0 - 0.1 K/uL   Immature Granulocytes 0 %   Abs Immature Granulocytes 0.05 0.00 - 0.07 K/uL    Comment: Performed at [facility], [address] [place], [place] [address]  Protime-INR     Status: Abnormal   Collection Time: 11/18/23  1:18 PM  Result Value Ref Range   Prothrombin Time 16.5 (H) 11.4 - 15.2 seconds   INR  1.3 (H) 0.8 - 1.2    Comment: (NOTE) INR goal varies based on device and disease states. Performed at [facility], [address] [place], [place] [address]   APTT     Status: None   Collection Time: 11/18/23  1:18 PM  Result Value Ref Range   aPTT 35 24 - 36 seconds    Comment: Performed at [facility], [address], [place], [place] [address]  I-Stat Lactic Acid, ED     Status: None   Collection Time: 11/18/23  1:34 PM  Result Value Ref Range   Lactic Acid, Venous 1.3 0.5 - 1.9 mmol/L   CT Head Wo Contrast  Result Date: 11/18/2023 CLINICAL DATA:  Mental status change, unknown cause EXAM: CT HEAD WITHOUT CONTRAST TECHNIQUE: Contiguous axial images were obtained from the base of the skull through the vertex without intravenous contrast. RADIATION DOSE REDUCTION: This exam was performed according to the departmental dose-optimization program which includes automated exposure control, adjustment of the mA and/or kV according to patient size and/or use of iterative reconstruction technique. COMPARISON:  11/10/2023 FINDINGS: Brain: No evidence of acute infarction, hemorrhage, hydrocephalus, extra-axial collection or mass lesion/mass effect. Patchy low-density changes within the periventricular and subcortical white matter most compatible with chronic microvascular ischemic change. Moderate diffuse cerebral volume loss. Vascular: Atherosclerotic calcifications involving the large vessels of the skull base. No unexpected hyperdense vessel. Skull: Negative for calvarial fracture. Similar thinning of the parietal calvarium bilaterally. Sinuses/Orbits: No acute finding. Other: None. IMPRESSION: 1. No acute intracranial findings. 2. Chronic microvascular ischemic change and cerebral volume loss. Electronically Signed   By: Duanne Guess D.O.   On: 11/18/2023 14:21   DG Chest Port 1 View  Result Date: 11/18/2023 CLINICAL DATA:  Sepsis EXAM: PORTABLE CHEST 1  VIEW COMPARISON:  X-ray 11/10/2023. older exams FINDINGS: Sternal wires. Normal cardiopericardial silhouette. Calcified aorta. No consolidation, pneumothorax or effusion. No edema. Status post TAVR. Overlapping cardiac leads. Degenerative changes of the spine and shoulders. IMPRESSION: Postop chest. Status post TAVR. Underinflation. No acute cardiopulmonary disease Electronically Signed   By: Karen Kays M.D.   On: 11/18/2023 13:31    Pending Labs Unresulted Labs (From admission, onward)     Start     Ordered   11/18/23 1256  Resp panel by RT-PCR (RSV, Flu A&B, Covid) Anterior Nasal Swab  Once,   URGENT        11/18/23 1255   11/18/23 1256  Urine Culture  Once,   R        11/18/23 1256   11/18/23 1249  Blood Culture (routine x 2)  (Undifferentiated presentation (screening labs and basic nursing orders))  BLOOD CULTURE X 2,   STAT      11/18/23 1248            Vitals/Pain Today's Vitals   11/18/23 1240 11/18/23 1252 11/18/23 1255  BP:   120/66  Pulse:   (!) 116  Resp:   (!) 22  Temp:  99.5 F (37.5 C)   TempSrc:  Rectal   SpO2:   97%  PainSc: 0-No pain      Isolation Precautions No active isolations  Medications Medications  lactated ringers infusion ( Intravenous New Bag/Given 11/18/23 1323)  cefTRIAXone (ROCEPHIN) 2 g in sodium chloride 0.9 % 100 mL IVPB (0 g Intravenous Stopped 11/18/23 1502)  lactated ringers bolus 500 mL (has no administration in time range)  lactated ringers bolus 500 mL (0 mLs Intravenous Stopped 11/18/23 1502)    Mobility non-ambulatory

## 2023-11-18 NOTE — ED Triage Notes (Signed)
Pt coming from [facility]. Pt altered mental for 3x days. Pt does have dementia, confused more than baseline. Pulse 122, resp 24.

## 2023-11-18 NOTE — Sepsis Progress Note (Signed)
eLink is following this Code Sepsis.

## 2023-11-18 NOTE — ED Provider Notes (Signed)
[place] EMERGENCY DEPARTMENT AT [facility] Provider Note   CSN: [id number] Arrival date & time: 11/18/23  1223     History  Chief Complaint  Patient presents with   Altered Mental Status    ZAVEN CHEATOM is a 89 y.o. male.  Pt is a 89y/o male with hx of hld, gout, gerd, dvt (no thinners), cad, dm2, prostate and bladder cancer, chf, and ckd who is presenting from nursing facility for altered mental status.  They reported for the last 3 days patient has been altered and's been gradually worsening.  Family wanted to come and see him on Thanksgiving and after seeing him they called 911.  Patient has had poor appetite and is normally interactive but was not responding much at all today.  There is no report of cough, congestion, nausea or vomiting.  Patient has not had any falls since returning to the nursing home.  EMS reported patient's blood sugar was normal, vital signs showed tachycardia and he felt warm to the touch.  No hypoxia noted by EMS.  The history is provided by the EMS personnel, medical records and the nursing home. The history is limited by the condition of the patient.  Altered Mental Status      Home Medications Prior to Admission medications   Medication Sig Start Date End Date Taking? Authorizing Provider  acetaminophen (TYLENOL) 500 MG tablet Take 1 tablet (500 mg total) by mouth 3 (three) times daily. 11/05/23   Marguerita Merles Latif, DO  aspirin 81 MG chewable tablet Chew 81 mg by mouth every evening.    [provider]  Ca Carbonate-Mag Hydroxide (ROLAIDS PO) Take by mouth as needed (reflux).    [provider]  Cholecalciferol (VITAMIN D-3) 125 MCG (5000 UT) TABS Take 5,000 Units by mouth daily.    [provider]  doxycycline (VIBRA-TABS) 100 MG tablet Take 1 tablet (100 mg total) by mouth every 12 (twelve) hours. 11/05/23   Marguerita Merles Latif, DO  DULoxetine (CYMBALTA) 20 MG capsule Take 20 mg by mouth 2 (two) times  daily.    [provider]  Ensure Max Protein (ENSURE MAX PROTEIN) LIQD Take 330 mLs (11 oz total) by mouth daily. 10/01/23   Narda Bonds, MD  famotidine (PEPCID) 20 MG tablet Take 1 tablet (20 mg total) by mouth daily. 11/05/23   Marguerita Merles Latif, DO  gabapentin (NEURONTIN) 100 MG capsule Take 100 mg by mouth every 6 (six) hours as needed (nerve pain).    [provider]  lidocaine (LIDODERM) 5 % Place 1 patch onto the skin as needed. Remove & Discard patch within 12 hours or as directed by MD    [provider]  midodrine (PROAMATINE) 10 MG tablet Take 1 tablet (10 mg total) by mouth 3 (three) times daily with meals. 11/04/23   Marguerita Merles Latif, DO  Multiple Vitamin (MULTIVITAMIN) tablet Take 1 tablet by mouth at bedtime.    [provider]  nitroGLYCERIN (NITROSTAT) 0.4 MG SL tablet Place 1 tablet (0.4 mg total) under the tongue every 5 (five) minutes as needed for chest pain. 08/26/23   Christell Constant, MD  nutrition supplement, JUVEN, (JUVEN) PACK Take 1 packet by mouth 2 (two) times daily between meals. 09/30/23   Narda Bonds, MD  nystatin cream (MYCOSTATIN) Apply 1 Application topically 3 (three) times daily.    [provider]  ondansetron (ZOFRAN) 4 MG tablet Take 4 mg by mouth every 6 (six) hours  as needed for nausea or vomiting.    [provider]  polyethylene glycol (MIRALAX / GLYCOLAX) 17 g packet Take 17 g by mouth daily as needed for mild constipation. 09/30/23   Narda Bonds, MD  Propylene Glycol (SYSTANE COMPLETE) 0.6 % SOLN Place 1 drop into the left eye 2 (two) times daily.    [provider]  psyllium (HYDROCIL/METAMUCIL) 95 % PACK Take 1 packet by mouth daily as needed for mild constipation.    [provider]  tamsulosin (FLOMAX) 0.4 MG CAPS capsule Take 0.4 mg by mouth 2 (two) times daily.    [provider]      Allergies    Fish allergy, Lipitor [atorvastatin],  Penicillins, Simvastatin, Adacel [tetanus-diphth-acell pertussis], Lasix [furosemide], Septra [sulfamethoxazole-trimethoprim], Flexeril [cyclobenzaprine], and Pantoprazole    Review of Systems   Review of Systems  Physical Exam Updated Vital Signs BP 120/66   Pulse (!) 116   Temp 99.5 F (37.5 C) (Rectal)   Resp (!) 22   SpO2 97%  Physical Exam Vitals and nursing note reviewed.  Constitutional:      General: He is not in acute distress.    Appearance: He is well-developed and underweight.     Comments: When speaking to the patient he does attempt to try to say something but will not follow commands.  He does respond to pain  HENT:     Head: Normocephalic and atraumatic.     Mouth/Throat:     Mouth: Mucous membranes are dry.     Comments: Mouth is very dry with some oral breakdown and lesions towards the back of the mouth Eyes:     Extraocular Movements: Extraocular movements intact.     Conjunctiva/sclera: Conjunctivae normal.     Pupils: Pupils are equal, round, and reactive to light.  Cardiovascular:     Rate and Rhythm: Regular rhythm. Tachycardia present.     Heart sounds: No murmur heard. Pulmonary:     Effort: Pulmonary effort is normal. No respiratory distress.     Breath sounds: Normal breath sounds. No wheezing or rales.  Abdominal:     General: There is no distension.     Palpations: Abdomen is soft.     Tenderness: There is no abdominal tenderness. There is no guarding or rebound.  Musculoskeletal:        General: No tenderness. Normal range of motion.     Cervical back: Normal range of motion and neck supple.     Right lower leg: No edema.     Left lower leg: No edema.     Comments: No skin breakdown noted  Skin:    General: Skin is warm and dry.     Findings: No erythema or rash.  Neurological:     Mental Status: He is oriented to person, place, and time. He is lethargic.  Psychiatric:        Behavior: Behavior normal.     ED Results / Procedures /  Treatments   Labs (all labs ordered are listed, but only abnormal results are displayed) Labs Reviewed  COMPREHENSIVE METABOLIC PANEL - Abnormal; Notable for the following components:      Result Value   Sodium 147 (*)    Chloride 112 (*)    Glucose, Bld 185 (*)    BUN 56 (*)    Creatinine, Ser 1.83 (*)    Calcium 13.5 (*)    Albumin 2.8 (*)    GFR, Estimated 35 (*)  All other components within normal limits  CBC WITH DIFFERENTIAL/PLATELET - Abnormal; Notable for the following components:   WBC 12.7 (*)    RBC 3.63 (*)    Hemoglobin 11.0 (*)    HCT 34.7 (*)    Platelets 446 (*)    Neutro Abs 8.6 (*)    All other components within normal limits  PROTIME-INR - Abnormal; Notable for the following components:   Prothrombin Time 16.5 (*)    INR 1.3 (*)    All other components within normal limits  URINALYSIS, W/ REFLEX TO CULTURE (INFECTION SUSPECTED) - Abnormal; Notable for the following components:   Color, Urine BROWN (*)    APPearance TURBID (*)    Hgb urine dipstick MODERATE (*)    Protein, ur 100 (*)    Nitrite POSITIVE (*)    Leukocytes,Ua SMALL (*)    Bacteria, UA MANY (*)    All other components within normal limits  CULTURE, BLOOD (ROUTINE X 2)  CULTURE, BLOOD (ROUTINE X 2)  RESP PANEL BY RT-PCR (RSV, FLU A&B, COVID)  [id number]  URINE CULTURE  APTT  I-STAT CG4 LACTIC ACID, ED    EKG None  Radiology DG Chest Port 1 View  Result Date: 11/18/2023 CLINICAL DATA:  Sepsis EXAM: PORTABLE CHEST 1 VIEW COMPARISON:  X-ray 11/10/2023. older exams FINDINGS: Sternal wires. Normal cardiopericardial silhouette. Calcified aorta. No consolidation, pneumothorax or effusion. No edema. Status post TAVR. Overlapping cardiac leads. Degenerative changes of the spine and shoulders. IMPRESSION: Postop chest. Status post TAVR. Underinflation. No acute cardiopulmonary disease Electronically Signed   By: Karen Kays M.D.   On: 11/18/2023 13:31    Procedures Procedures    Medications  Ordered in ED Medications  lactated ringers infusion ( Intravenous New Bag/Given 11/18/23 1323)  cefTRIAXone (ROCEPHIN) 2 g in sodium chloride 0.9 % 100 mL IVPB (has no administration in time range)  lactated ringers bolus 500 mL (has no administration in time range)  lactated ringers bolus 500 mL (500 mLs Intravenous New Bag/Given 11/18/23 1324)    ED Course/ Medical Decision Making/ A&P                                 Medical Decision Making Amount and/or Complexity of Data Reviewed Independent Historian: spouse and EMS External Data Reviewed: notes. Labs: ordered. Decision-making details documented in ED Course. Radiology: ordered and independent interpretation performed. Decision-making details documented in ED Course. ECG/medicine tests: ordered and independent interpretation performed. Decision-making details documented in ED Course.  Risk Prescription drug management. Decision regarding hospitalization.   Pt with multiple medical problems and comorbidities and presenting today with a complaint that caries a high risk for morbidity and mortality.  Here today due to altered mental status.  Concern for worsening AKI, electrolyte abnormalities, infectious etiology such as pneumonia or UTI.  Patient has no evidence of cellulitis.  Concern for metabolic encephalopathy.  Lower suspicion for stroke or head bleed.  Patient does appear very dehydrated here with dry mucous membranes and poor skin turgor.  Will give IV fluids.  Blood sugar noted by EMS was normal.  2:18 PM I independently interpreted patient's labs and EKG.  UA today concerning for infection with positive nitrites, leukocytes, greater than 50 red cells white cells and many bacteria.  This looks significantly different than prior UAs.  CBC with leukocytosis of 12.7, stable hemoglobin of 11, CMP with new hyponatremia of 147, AKI with creatinine 1.8  from baseline of 1.2-1.4 and calcium of 13.5.  Anion gap is normal.  Lactic acid  within normal limits.  Consulted pharmacy for appropriate antibiotic therapy.  He does have a penicillin allergy but no noted allergy to cephalosporins.  2:18 PM Spoke with pharmacy who recommended Rocephin at this time.  I have independently visualized and interpreted pt's images today. Chest x-ray without evidence of pneumonia.  Head CT without evidence of bleed.  Given above findings we will admit patient for AKI, Dehydration, altered mental status, UTI.  Findings discussed with the patient's wife who is present at bedside.  They are comfortable with this plan.  Currently patient is a full code but had a long discussion with the patient's wife about what DNR means and she is not quite ready to make the decision to make him DNR but is considering it.        Final Clinical Impression(s) / ED Diagnoses Final diagnoses:  AKI (acute kidney injury) (HCC)  Hypernatremia  Somnolence  Urinary tract infection with hematuria, site unspecified  Sepsis with acute renal failure without septic shock, due to unspecified organism, unspecified acute renal failure type [facility])    Rx / DC Orders ED Discharge Orders     None         Gwyneth Sprout, MD 11/18/23 1421

## 2023-11-18 NOTE — H&P (Signed)
History and Physical    [name]:[id number] DOB: 1934/05/14 DOA: 11/18/2023  PCP: [name], MD Patient coming from: [facility]  Chief Complaint: Altered mental status  HPI: [name] is a 89 y.o. male with medical history significant of stage III CKD stage IIIa, renal cell carcinoma status post nephrectomy, bladder cancer, prostate cancer status postradiation, hypertension hyperlipidemia recent TAVR, CABG, type 2 diabetes, systolic heart failure, chronic back pain recent back surgery, gout, GERD, he had back surgery in October 2024 complicated by postsurgical wound infection cellulitis and UTI.  He was discharged to [facility] in October and has been living at [facility] now under long-term care.  He was sent to the hospital due to change in mental status decreased p.o. intake no appetite for the last 5 to 6 weeks.  Family denies nausea vomiting diarrhea.  No fever chills or cough reported.  He does not have a Foley catheter.  He does wear diaper and is dry when I saw him in the ER.  In the ER he was found to have a very elevated calcium of 13, hypernatremia sodium 147, UA consistent with dehydration and UTI with elevated white count of 12 tachypnea tachycardia and cytosis.  He is being admitted with severe dehydration hypercalcemia, hyponatremia and sepsis likely from urinary tract infection.  History was obtained from the patient's records and patient's wife who has memory issues lives in memory care and his niece was in the ER when I saw the patient.  CODE STATUS was discussed with patient with niece and agreed that DNR would be appropriate for him in this situation.  Patient appears very masticated with scaphoid belly and unable to follow commands appears extremely dry. He received Ringer lactate bolus and Rocephin in the ER.  Past Medical History:  Diagnosis Date   Anemia due to chronic kidney disease    Benign localized prostatic hyperplasia with lower urinary tract symptoms  (LUTS)    Bladder cancer (HCC) 04/2023   dr dahlstedt   Carpal tunnel syndrome on both sides    Chronic constipation    Chronic gout without tophus    followed by pcp    (05-07-2023  per pt last flare-up 6-8 months ago, usually affects bilateral great toes)   Chronic low back pain    per pt s/p RFA's   Chronic narcotic use    Chronic systolic CHF (congestive heart failure) (HCC)    followed by cardiology;  preserved ef   CKD (chronic kidney disease), stage III (HCC) 2017   previously seen by nephrologsit--- dr Hyman Hopes   Coronary artery disease 1993   cardiologist--- dr h. Katrinka Blazing;   1993-- s/p cath w/ PTCA to occluded RCA;  a. 2007: s/p CABG x4 (LIMA--> LAD, rSVG--> RI, rSVG-seq -->dRCA and PLA))  c. 2017: LHC with 2/3 patent bypass grafts with occuluded SVG--> RI   DDD (degenerative disc disease), lumbosacral    GERD (gastroesophageal reflux disease)    History of adenomatous polyp of colon    History of cardiomyopathy 09/2016   in setting severe aortic stenosis and new acute on chronic systolic CHF, ef 81-19%   History of DVT of lower extremity 2008   superficial thrombophlebitis-- R Lower leg, right upper leg   History of polymyalgia rheumatica 2011   per pt hx was treated and resolved after being dx approx 2011   History of prostate cancer 2009   urologist--- dr Retta Diones;   dx 2009,  Gleason 4+3;   02-08-2009  s/p radioactive prostate seed implants by dr Vonita Moss  (05-07-2023  PSA undetectable)   History of renal cell cancer 2014   primary urologist-- dr Frederich Cha;   incidental finding on imaging for back , left renal mass;   08-31-2013  s/p partial left nephrecotmy   History of septic shock 2016   post op  lumbar back surgery, positive blood culture  (discitis/ ostomyelitis)   Hyperlipidemia    Hypertension    Peripheral neuropathy    hands and feet   Pulmonary nodule, right    solitary ,  last Chest CT in epic 12-08-2018 stable   S/P aortic valve replacement with prosthetic  valve 07/02/2017   @[facility]  by Dr Laneta Simmers w/ Stephani Police prosthesis  for severe aortic valve stenosis  (nonrheumatic)   Secondary hyperparathyroidism of renal origin (HCC)    Type 2 diabetes mellitus (HCC)    followed by pcp;   (05-07-2023  per pt only check blood sugar occasionlly)   Wears partial dentures    upper    Past Surgical History:  Procedure Laterality Date   APPENDECTOMY     child   CARDIAC CATHETERIZATION N/A 11/03/2016   Procedure: Right/Left Heart Cath and Coronary/Graft Angiography;  Surgeon: Lyn Records, MD;  Location: [facility] INVASIVE CV LAB;  Service: Cardiovascular;  Laterality: N/A;   CARDIAC CATHETERIZATION  03/30/2006   @[facility]  by dr h. Katrinka Blazing;   severe coronary artery disease   CATARACT EXTRACTION W/ INTRAOCULAR LENS IMPLANT Bilateral    2015/ 2019   CORONARY ANGIOPLASTY  1993   @[facility]  by dr h. Katrinka Blazing;   PTCA to total occluded RCA   CORONARY ARTERY BYPASS GRAFT  03/31/2006   @[facility]  by dr gerhardt;   x4--  LIMA--LAD/  rSVG--RI/   rSVG seq -- dRCA and PLB of RCA   INSERTION PROSTATE RADIATION SEED  02/08/2009   @[facility]  by dr Vonita Moss;   radioactive prostate seed implants for prostate cancer   LEFT HEART CATH AND CORS/GRAFTS ANGIOGRAPHY N/A 09/01/2023   Procedure: LEFT HEART CATH AND CORS/GRAFTS ANGIOGRAPHY;  Surgeon: Corky Crafts, MD;  Location: [facility]VAS[facility] CV LAB;  Service: Cardiovascular;  Laterality: N/A;   LUMBAR SPINE SURGERY  10/02/2015   dr Dutch Quint;   L3--L5   ROBOTIC ASSITED PARTIAL NEPHRECTOMY Left 08/31/2013   Procedure: ROBOTIC ASSITED PARTIAL NEPHRECTOMY;  Surgeon: Crecencio Mc, MD;  Location: [facility] ORS;  Service: Urology;  Laterality: Left;   TEE WITHOUT CARDIOVERSION N/A 06/22/2017   Procedure: TRANSESOPHAGEAL ECHOCARDIOGRAM (TEE);  Surgeon: Tonny Bollman, MD;  Location: [facility] OR;  Service: Open Heart Surgery;  Laterality: N/A;   TRANSCATHETER AORTIC VALVE REPLACEMENT, TRANSFEMORAL N/A 06/22/2017   Procedure: TRANSCATHETER AORTIC VALVE REPLACEMENT, TRANSFEMORAL;   Surgeon: Tonny Bollman, MD;  Location: [facility] OR;  Service: Open Heart Surgery;  Laterality: N/A;   VEIN LIGATION AND STRIPPING Right 1972   right lower leg    Social History   Socioeconomic History   Marital status: Married    Spouse name: Not on file   Number of children: Not on file   Years of education: Not on file   Highest education level: Not on file  Occupational History   Not on file  Tobacco Use   Smoking status: Former    Current packs/day: 0.00    Average packs/day: 3.0 packs/day for 30.0 years (90.0 ttl pk-yrs)    Types: Cigarettes    Start date: 71    Quit date: 12    Years since quitting: 42.9   Smokeless  tobacco: Never  Vaping Use   Vaping status: Never Used  Substance and Sexual Activity   Alcohol use: No   Drug use: Never   Sexual activity: Not on file  Other Topics Concern   Not on file  Social History Narrative   Lives at home with wife   Right handed   Caffeine: 1 cup/day, occassionally has a 2nd    Social Determinants of Health   Financial Resource Strain: Not on file  Food Insecurity: Patient Unable To Answer (10/28/2023)   Hunger Vital Sign    Worried About Running Out of Food in the Last Year: Patient unable to answer    Ran Out of Food in the Last Year: Patient unable to answer  Transportation Needs: Patient Unable To Answer (10/28/2023)   PRAPARE - Transportation    Lack of Transportation (Medical): Patient unable to answer    Lack of Transportation (Non-Medical): Patient unable to answer  Physical Activity: Not on file  Stress: No Stress Concern Present (09/06/2023)   Received from [facility] - Occupational Stress Questionnaire    Feeling of Stress : Not at all  Social Connections: Unknown (05/05/2022)   Received from [facility]   Social Network    Social Network: Not on file  Intimate Partner Violence: Patient Unable To Answer (10/28/2023)   Humiliation, Afraid, Rape,  and Kick questionnaire    Fear of Current or Ex-Partner: Patient unable to answer    Emotionally Abused: Patient unable to answer    Physically Abused: Patient unable to answer    Sexually Abused: Patient unable to answer    Allergies  Allergen Reactions   Fish Allergy Swelling    Dark fish- salmon, tuna    Lipitor [Atorvastatin] Other (See Comments)    Muscle pain   Penicillins Swelling    SWELLING OF THE FEET  Has patient had a PCN reaction causing immediate rash, facial/tongue/throat swelling, SOB or lightheadedness with hypotension: no Has patient had a PCN reaction causing severe rash involving mucus membranes or skin necrosis: no Has patient had a PCN reaction that required hospitalization no Has patient had a PCN reaction occurring within the last 10 years: no If all of the above answers are "NO", then may proceed with Cephalosporin use.   Simvastatin Other (See Comments)    MUSCLE PAIN   Adacel [Tetanus-Diphth-Acell Pertussis]     UNSPECIFIED REACTION    Lasix [Furosemide] Swelling    SWELLING REACTION UNSPECIFIED    Septra [Sulfamethoxazole-Trimethoprim]     UNSPECIFIED REACTION    Flexeril [Cyclobenzaprine] Other (See Comments)    Causes confusion   Pantoprazole Diarrhea    Family History  Problem Relation Age of Onset   Heart Problems Mother    Heart Problems Father    Neuropathy Neg Hx       Prior to Admission medications   Medication Sig Start Date End Date Taking? Authorizing Provider  acetaminophen (TYLENOL) 500 MG tablet Take 1 tablet (500 mg total) by mouth 3 (three) times daily. 11/05/23   Marguerita Merles Latif, DO  aspirin 81 MG chewable tablet Chew 81 mg by mouth every evening.    [provider]  Ca Carbonate-Mag Hydroxide (ROLAIDS PO) Take by mouth as needed (reflux).    [provider]  Cholecalciferol (VITAMIN D-3) 125 MCG (5000 UT) TABS Take 5,000 Units by mouth daily.    [provider]  doxycycline (VIBRA-TABS) 100 MG  tablet Take 1  tablet (100 mg total) by mouth every 12 (twelve) hours. 11/05/23   Marguerita Merles Latif, DO  DULoxetine (CYMBALTA) 20 MG capsule Take 20 mg by mouth 2 (two) times daily.    [provider]  Ensure Max Protein (ENSURE MAX PROTEIN) LIQD Take 330 mLs (11 oz total) by mouth daily. 10/01/23   Narda Bonds, MD  famotidine (PEPCID) 20 MG tablet Take 1 tablet (20 mg total) by mouth daily. 11/05/23   Marguerita Merles Latif, DO  gabapentin (NEURONTIN) 100 MG capsule Take 100 mg by mouth every 6 (six) hours as needed (nerve pain).    [provider]  lidocaine (LIDODERM) 5 % Place 1 patch onto the skin as needed. Remove & Discard patch within 12 hours or as directed by MD    [provider]  midodrine (PROAMATINE) 10 MG tablet Take 1 tablet (10 mg total) by mouth 3 (three) times daily with meals. 11/04/23   Marguerita Merles Latif, DO  Multiple Vitamin (MULTIVITAMIN) tablet Take 1 tablet by mouth at bedtime.    [provider]  nitroGLYCERIN (NITROSTAT) 0.4 MG SL tablet Place 1 tablet (0.4 mg total) under the tongue every 5 (five) minutes as needed for chest pain. 08/26/23   Christell Constant, MD  nutrition supplement, JUVEN, (JUVEN) PACK Take 1 packet by mouth 2 (two) times daily between meals. 09/30/23   Narda Bonds, MD  nystatin cream (MYCOSTATIN) Apply 1 Application topically 3 (three) times daily.    [provider]  ondansetron (ZOFRAN) 4 MG tablet Take 4 mg by mouth every 6 (six) hours as needed for nausea or vomiting.    [provider]  polyethylene glycol (MIRALAX / GLYCOLAX) 17 g packet Take 17 g by mouth daily as needed for mild constipation. 09/30/23   Narda Bonds, MD  Propylene Glycol (SYSTANE COMPLETE) 0.6 % SOLN Place 1 drop into the left eye 2 (two) times daily.    [provider]  psyllium (HYDROCIL/METAMUCIL) 95 % PACK Take 1 packet by mouth daily as needed for mild constipation.    [provider]   tamsulosin (FLOMAX) 0.4 MG CAPS capsule Take 0.4 mg by mouth 2 (two) times daily.    [provider]    Physical Exam: Vitals:   11/18/23 1252 11/18/23 1255  BP:  120/66  Pulse:  (!) 116  Resp:  (!) 22  Temp: 99.5 F (37.5 C)   TempSrc: Rectal   SpO2:  97%     General:  Appears frail chronically ill-appearing cachectic  eyes:  PERRL, EOMI, normal lids, iris ENT:  grossly normal hearing, lips & tongue oral mucosa is very dry  neck:  no LAD, masses or thyromegaly Cardiovascular:  RRR, no m/r/g. No LE edema.  Respiratory:  CTA bilaterally, no w/r/r. Normal respiratory effort. Abdomen: Scaphoid abdomen  skin:  no rash or induration seen on limited exam Neurologic: Resting in bed with eyes open but unable to follow commands or answer questions appropriately. Labs on Admission: I have personally reviewed following labs and imaging studies  CBC: Recent Labs  Lab 11/18/23 1318  WBC 12.7*  NEUTROABS 8.6*  HGB 11.0*  HCT 34.7*  MCV 95.6  PLT 446*   Basic Metabolic Panel: Recent Labs  Lab 11/18/23 1318  NA 147*  K 3.7  CL 112*  CO2 25  GLUCOSE 185*  BUN 56*  CREATININE 1.83*  CALCIUM 13.5*   GFR: CrCl cannot be calculated (Unknown ideal weight.). Liver Function Tests: Recent  Labs  Lab 11/18/23 1318  AST 23  ALT 13  ALKPHOS 62  BILITOT 0.4  PROT 7.5  ALBUMIN 2.8*   No results for input(s): "LIPASE", "AMYLASE" in the last 168 hours. No results for input(s): "AMMONIA" in the last 168 hours. Coagulation Profile: Recent Labs  Lab 11/18/23 1318  INR 1.3*   Cardiac Enzymes: No results for input(s): "CKTOTAL", "CKMB", "CKMBINDEX", "TROPONINI" in the last 168 hours. BNP (last 3 results) No results for input(s): "PROBNP" in the last 8760 hours. HbA1C: No results for input(s): "HGBA1C" in the last 72 hours. CBG: No results for input(s): "GLUCAP" in the last 168 hours. Lipid Profile: No results for input(s): "CHOL", "HDL", "LDLCALC", "TRIG",  "CHOLHDL", "LDLDIRECT" in the last 72 hours. Thyroid Function Tests: No results for input(s): "TSH", "T4TOTAL", "FREET4", "T3FREE", "THYROIDAB" in the last 72 hours. Anemia Panel: No results for input(s): "VITAMINB12", "FOLATE", "FERRITIN", "TIBC", "IRON", "RETICCTPCT" in the last 72 hours. Urine analysis:    Component Value Date/Time   COLORURINE BROWN (A) 11/18/2023 1256   APPEARANCEUR TURBID (A) 11/18/2023 1256   LABSPEC 1.018 11/18/2023 1256   PHURINE 5.0 11/18/2023 1256   GLUCOSEU NEGATIVE 11/18/2023 1256   HGBUR MODERATE (A) 11/18/2023 1256   BILIRUBINUR NEGATIVE 11/18/2023 1256   KETONESUR NEGATIVE 11/18/2023 1256   PROTEINUR 100 (A) 11/18/2023 1256   UROBILINOGEN 1.0 10/26/2015 0115   NITRITE POSITIVE (A) 11/18/2023 1256   LEUKOCYTESUR SMALL (A) 11/18/2023 1256    Creatinine Clearance: CrCl cannot be calculated (Unknown ideal weight.).  Sepsis Labs: @LABRCNTIP (procalcitonin:4,lacticidven:4) ) Recent Results (from the past 240 hour(s))  Urine Culture     Status: None   Collection Time: 11/10/23  5:40 PM   Specimen: In/Out Cath Urine  Result Value Ref Range Status   Specimen Description   Final    IN/OUT CATH URINE Performed at [facility], [address], [place], [place] [address]    Special Requests   Final    NONE Performed at [facility], [address], [place], [place] [address]    Culture   Final    NO GROWTH Performed at [facility], [address] [place], [place] [address]    Report Status 11/11/2023 FINAL  Final  Culture, blood (routine x 2)     Status: None   Collection Time: 11/10/23  5:45 PM   Specimen: BLOOD  Result Value Ref Range Status   Specimen Description   Final    BLOOD LEFT ANTECUBITAL Performed at [facility], [address], [place], [place] [address]    Special Requests   Final    BOTTLES DRAWN AEROBIC AND ANAEROBIC Blood Culture results may not be optimal due  to an excessive volume of blood received in culture bottles Performed at [facility], [address] [place], [place] [address]    Culture   Final    NO GROWTH 5 DAYS Performed at [facility], [address], [place], [place] [address]    Report Status 11/15/2023 FINAL  Final  Culture, blood (routine x 2)     Status: Abnormal   Collection Time: 11/10/23  8:34 PM   Specimen: BLOOD  Result Value Ref Range Status   Specimen Description   Final    BLOOD SITE NOT SPECIFIED Performed at [facility], [address], [place], [place] [address]    Special Requests   Final    BOTTLES DRAWN AEROBIC AND ANAEROBIC Blood  Culture adequate volume Performed at [facility], [address] [place], [place] [address]    Culture  Setup Time   Final    GRAM POSITIVE COCCI IN CLUSTERS ANAEROBIC BOTTLE ONLY Organism ID to follow CRITICAL RESULT CALLED TO, READ BACK BY AND VERIFIED WITH: K GRAY,RN@0110  11/12/23 MK    Culture (A)  Final    STAPHYLOCOCCUS EPIDERMIDIS THE SIGNIFICANCE OF ISOLATING THIS ORGANISM FROM A SINGLE SET OF BLOOD CULTURES WHEN MULTIPLE SETS ARE DRAWN IS UNCERTAIN. PLEASE NOTIFY THE MICROBIOLOGY DEPARTMENT WITHIN ONE WEEK IF SPECIATION AND SENSITIVITIES ARE REQUIRED. Performed at [facility], [address], [place], [place] [address]    Report Status 11/13/2023 FINAL  Final  Blood Culture ID Panel (Reflexed)     Status: Abnormal   Collection Time: 11/10/23  8:34 PM  Result Value Ref Range Status   Enterococcus faecalis NOT DETECTED NOT DETECTED Final   Enterococcus Faecium NOT DETECTED NOT DETECTED Final   Listeria monocytogenes NOT DETECTED NOT DETECTED Final   Staphylococcus species DETECTED (A) NOT DETECTED Final    Comment: CRITICAL RESULT CALLED TO, READ BACK BY AND VERIFIED WITH: K GRAY,RN@0110  11/12/23 MK    Staphylococcus aureus (BCID) NOT DETECTED NOT DETECTED Final   Staphylococcus  epidermidis DETECTED (A) NOT DETECTED Final    Comment: Methicillin (oxacillin) resistant coagulase negative staphylococcus. Possible blood culture contaminant (unless isolated from more than one blood culture draw or clinical case suggests pathogenicity). No antibiotic treatment is indicated for blood  culture contaminants. CRITICAL RESULT CALLED TO, READ BACK BY AND VERIFIED WITH: K GRAY,RN@0110  11/12/23 MK    Staphylococcus lugdunensis NOT DETECTED NOT DETECTED Final   Streptococcus species NOT DETECTED NOT DETECTED Final   Streptococcus agalactiae NOT DETECTED NOT DETECTED Final   Streptococcus pneumoniae NOT DETECTED NOT DETECTED Final   Streptococcus pyogenes NOT DETECTED NOT DETECTED Final   A.calcoaceticus-baumannii NOT DETECTED NOT DETECTED Final   Bacteroides fragilis NOT DETECTED NOT DETECTED Final   Enterobacterales NOT DETECTED NOT DETECTED Final   Enterobacter cloacae complex NOT DETECTED NOT DETECTED Final   Escherichia coli NOT DETECTED NOT DETECTED Final   Klebsiella aerogenes NOT DETECTED NOT DETECTED Final   Klebsiella oxytoca NOT DETECTED NOT DETECTED Final   Klebsiella pneumoniae NOT DETECTED NOT DETECTED Final   Proteus species NOT DETECTED NOT DETECTED Final   Salmonella species NOT DETECTED NOT DETECTED Final   Serratia marcescens NOT DETECTED NOT DETECTED Final   Haemophilus influenzae NOT DETECTED NOT DETECTED Final   Neisseria meningitidis NOT DETECTED NOT DETECTED Final   Pseudomonas aeruginosa NOT DETECTED NOT DETECTED Final   Stenotrophomonas maltophilia NOT DETECTED NOT DETECTED Final   Candida albicans NOT DETECTED NOT DETECTED Final   Candida auris NOT DETECTED NOT DETECTED Final   Candida glabrata NOT DETECTED NOT DETECTED Final   Candida krusei NOT DETECTED NOT DETECTED Final   Candida parapsilosis NOT DETECTED NOT DETECTED Final   Candida tropicalis NOT DETECTED NOT DETECTED Final   Cryptococcus neoformans/gattii NOT DETECTED NOT DETECTED Final    Methicillin resistance mecA/C DETECTED (A) NOT DETECTED Final    Comment: CRITICAL RESULT CALLED TO, READ BACK BY AND VERIFIED WITH: K GRAY,RN@0110  11/12/23 MK Performed at [facility], [address] [place], [place] [address]      Radiological Exams on Admission: CT Head Wo Contrast  Result Date: 11/18/2023 CLINICAL DATA:  Mental status change, unknown cause EXAM: CT HEAD WITHOUT CONTRAST TECHNIQUE: Contiguous axial images were obtained from the base  of the skull through the vertex without intravenous contrast. RADIATION DOSE REDUCTION: This exam was performed according to the departmental dose-optimization program which includes automated exposure control, adjustment of the mA and/or kV according to patient size and/or use of iterative reconstruction technique. COMPARISON:  11/10/2023 FINDINGS: Brain: No evidence of acute infarction, hemorrhage, hydrocephalus, extra-axial collection or mass lesion/mass effect. Patchy low-density changes within the periventricular and subcortical white matter most compatible with chronic microvascular ischemic change. Moderate diffuse cerebral volume loss. Vascular: Atherosclerotic calcifications involving the large vessels of the skull base. No unexpected hyperdense vessel. Skull: Negative for calvarial fracture. Similar thinning of the parietal calvarium bilaterally. Sinuses/Orbits: No acute finding. Other: None. IMPRESSION: 1. No acute intracranial findings. 2. Chronic microvascular ischemic change and cerebral volume loss. Electronically Signed   By: Duanne Guess D.O.   On: 11/18/2023 14:21   DG Chest Port 1 View  Result Date: 11/18/2023 CLINICAL DATA:  Sepsis EXAM: PORTABLE CHEST 1 VIEW COMPARISON:  X-ray 11/10/2023. older exams FINDINGS: Sternal wires. Normal cardiopericardial silhouette. Calcified aorta. No consolidation, pneumothorax or effusion. No edema. Status post TAVR. Overlapping cardiac leads. Degenerative changes of the spine and  shoulders. IMPRESSION: Postop chest. Status post TAVR. Underinflation. No acute cardiopulmonary disease Electronically Signed   By: Karen Kays M.D.   On: 11/18/2023 13:31     Assessment/Plan Principal Problem:   AKI (acute kidney injury) (HCC)   #1 hypercalcemia of dehydration with poor p.o. intake for 5 to 6 weeks.  Will continue IV fluid hydration follow-up labs later today and tomorrow.  #2 hypernatremia secondary to dehydration continue IV fluids follow-up labs in AM.  #3 patient met criteria for sepsis with leukocytosis tachypnea and tachycardia and fever with positive UA for UTI-on Rocephin follow-up urine culture blood cultures drawn in the ED.  #4 CODE STATUS DNR discussed with patient's wife and niece at the same time.  Will consult palliative care.  #5 single kidney status post nephrectomy for renal cell carcinoma CKD stage III AA continue fluid hydration avoid nephrotoxins follow-up labs  #6 history of hypertension with hypotension on midodrine prior to admission  #7 history of systolic heart failure with TAVR will monitor closely on IV fluids   Estimated body mass index is 26.33 kg/m as calculated from the following:   Height as of 10/29/23: 5\' 6"  (1.676 m).   Weight as of 10/28/23: 74 kg.   DVT prophylaxis:  lovenox Code Status:  dnr Family Communication:  wife and neice Disposition Plan:  in patient  Consults called:  none Admission status:  IP   Alwyn Ren MD   11/18/2023, 3:15 PM

## 2023-11-19 DIAGNOSIS — N179 Acute kidney failure, unspecified: Secondary | ICD-10-CM | POA: Diagnosis not present

## 2023-11-19 LAB — CBC
HCT: 33 % — ABNORMAL LOW (ref 39.0–52.0)
Hemoglobin: 9.6 g/dL — ABNORMAL LOW (ref 13.0–17.0)
MCH: 29.3 pg (ref 26.0–34.0)
MCHC: 29.1 g/dL — ABNORMAL LOW (ref 30.0–36.0)
MCV: 100.6 fL — ABNORMAL HIGH (ref 80.0–100.0)
Platelets: 345 10*3/uL (ref 150–400)
RBC: 3.28 MIL/uL — ABNORMAL LOW (ref 4.22–5.81)
RDW: 15.2 % (ref 11.5–15.5)
WBC: 12.1 10*3/uL — ABNORMAL HIGH (ref 4.0–10.5)
nRBC: 0 % (ref 0.0–0.2)

## 2023-11-19 LAB — COMPREHENSIVE METABOLIC PANEL
ALT: 13 U/L (ref 0–44)
AST: 20 U/L (ref 15–41)
Albumin: 2.5 g/dL — ABNORMAL LOW (ref 3.5–5.0)
Alkaline Phosphatase: 54 U/L (ref 38–126)
Anion gap: 8 (ref 5–15)
BUN: 43 mg/dL — ABNORMAL HIGH (ref 8–23)
CO2: 24 mmol/L (ref 22–32)
Calcium: 12.6 mg/dL — ABNORMAL HIGH (ref 8.9–10.3)
Chloride: 116 mmol/L — ABNORMAL HIGH (ref 98–111)
Creatinine, Ser: 1.58 mg/dL — ABNORMAL HIGH (ref 0.61–1.24)
GFR, Estimated: 42 mL/min — ABNORMAL LOW (ref >60)
Glucose, Bld: 158 mg/dL — ABNORMAL HIGH (ref 70–99)
Potassium: 3.8 mmol/L (ref 3.5–5.1)
Sodium: 148 mmol/L — ABNORMAL HIGH (ref 135–145)
Total Bilirubin: 0.7 mg/dL (ref <1.2)
Total Protein: 6.4 g/dL — ABNORMAL LOW (ref 6.5–8.1)

## 2023-11-19 LAB — GLUCOSE, CAPILLARY
Glucose-Capillary: 145 mg/dL — ABNORMAL HIGH (ref 70–99)
Glucose-Capillary: 153 mg/dL — ABNORMAL HIGH (ref 70–99)
Glucose-Capillary: 132 mg/dL — ABNORMAL HIGH (ref 70–99)
Glucose-Capillary: 137 mg/dL — ABNORMAL HIGH (ref 70–99)

## 2023-11-19 LAB — MRSA NEXT GEN BY PCR, NASAL: MRSA by PCR Next Gen: NOT DETECTED

## 2023-11-19 MED ORDER — INSULIN ASPART 100 UNIT/ML IJ SOLN
0.0000 [IU] | Freq: Three times a day (TID) | INTRAMUSCULAR | Status: DC
  Administered 2023-11-19 – 2023-11-23 (×8): 1 [IU] via SUBCUTANEOUS

## 2023-11-19 MED ORDER — ACETAMINOPHEN 325 MG PO TABS: 650.0000 mg | ORAL_TABLET | ORAL | Status: DC | PRN

## 2023-11-19 MED ORDER — ACETAMINOPHEN 650 MG RE SUPP
650.0000 mg | Freq: Four times a day (QID) | RECTAL | Status: DC | PRN
  Administered 2023-11-19 – 2023-11-20 (×3): 650 mg via RECTAL
  Filled 2023-11-19 (×3): qty 1

## 2023-11-19 MED ORDER — DEXTROSE 5 % IV SOLN: INTRAVENOUS | Status: AC

## 2023-11-19 MED ORDER — SODIUM CHLORIDE 0.9 % IV SOLN
90.0000 mg | Freq: Once | INTRAVENOUS | Status: AC
  Administered 2023-11-19: 90 mg via INTRAVENOUS
  Filled 2023-11-19: qty 10

## 2023-11-19 MED ORDER — METOPROLOL TARTRATE 5 MG/5ML IV SOLN
5.0000 mg | Freq: Three times a day (TID) | INTRAVENOUS | Status: DC | PRN
  Filled 2023-11-19: qty 5

## 2023-11-19 MED ORDER — LACTATED RINGERS IV BOLUS
500.0000 mL | Freq: Once | INTRAVENOUS | Status: AC
  Administered 2023-11-19: 500 mL via INTRAVENOUS

## 2023-11-19 NOTE — Plan of Care (Signed)
  Problem: Pain Management: Goal: General experience of comfort will improve Outcome: Progressing   Problem: Safety: Goal: Ability to remain free from injury will improve Outcome: Progressing   Problem: Skin Integrity: Goal: Risk for impaired skin integrity will decrease Outcome: Progressing

## 2023-11-19 NOTE — Consult Note (Signed)
WOC Nurse Consult Note: this patient is familiar to [facility] team from previous consult regarding this lumbar wound, had surgery at [facility] 9/16. Reason for Consult: back wound  Wound type: surgical w/dehiscence  Pressure Injury POA: NA  Measurement: 8cm x 3cm x 5cm (per nursing flow sheet at the time of admission) Wound bed: 25% yellow/75% red Drainage (amount, consistency, odor) minimal, purulent Periwound:  Dressing procedure/placement/frequency: Clean back wound with Vashe Hart [place] # [phone]), pat dry. 2. apply Silver Hydrofiber (Aquacel [employer] Hart [place] [phone]) cut to fit wound bed daily,  use a Q tip applicator to insert into wound bed leaving a tail of silver hanging out of wound for easy removal.    3. Cover with dry gauze and silicone foam. May lift foam daily to replace silver.  Change foam q3 days and prn soiling.  Moisten silver with NS if stuck to wound bed for atraumatic removal.   Patient should continue his follow-up with surgeon after discharge for continuing assessment and care of this incision.     Re consult if needed, will not follow at this time. Thanks  Jessicca Stitzer [employer], RN,CWOCN, CNS, CWON-AP [phone])

## 2023-11-19 NOTE — Progress Notes (Signed)
PROGRESS NOTE    [name]  QIH:[id number] DOB: March 15, 1934 DOA: 11/18/2023 PCP: [name], MD  Brief Narrative: 89 y.o. male with medical history significant of stage III CKD stage IIIa, renal cell carcinoma status post nephrectomy, bladder cancer, prostate cancer status postradiation, hypertension hyperlipidemia recent TAVR, CABG, type 2 diabetes, systolic heart failure, chronic back pain recent back surgery, gout, GERD, he had back surgery in October 2024 complicated by postsurgical wound infection cellulitis and UTI.  He was discharged to [facility] in October and has been living at [facility] now under long-term care.  He was sent to the hospital due to change in mental status decreased p.o. intake no appetite for the last 5 to 6 weeks.  Family denies nausea vomiting diarrhea.  No fever chills or cough reported.  He does not have a Foley catheter.  He does wear diaper and is dry when I saw him in the ER.  In the ER he was found to have a very elevated calcium of 13, hypernatremia sodium 147, UA consistent with dehydration and UTI with elevated white count of 12 tachypnea tachycardia and cytosis.  He is being admitted with severe dehydration hypercalcemia, hyponatremia and sepsis likely from urinary tract infection.  History was obtained from the patient's records and patient's wife who has memory issues lives in memory care and his niece was in the ER when I saw the patient.  CODE STATUS was discussed with patient with niece and agreed that DNR would be appropriate for him in this situation.  Patient appears very masticated with scaphoid belly and unable to follow commands appears extremely dry. He received Ringer lactate bolus and Rocephin in the ER.  Assessment & Plan:   Principal Problem:   AKI (acute kidney injury) (HCC) Active Problems:   Sepsis (HCC)   Hypercalcemia   Hypernatremia   Dehydration   #1 hypercalcemia of dehydration in the setting of multiple malignancies with poor  p.o. intake for 5 to 6 weeks.  Will continue IV fluid hydration follow-up labs later today and tomorrow. Ca 12.6 from 13.5 Pamidronate today   #2 hypernatremia secondary to dehydration change IV fluids to D5 w follow-up labs in AM.   #3 patient met criteria for sepsis with leukocytosis tachypnea and tachycardia and fever with positive UA for UTI-on Rocephin follow-up urine culture blood cultures drawn in the ED.   #4 CODE STATUS DNR discussed with patient's wife and niece at the same time.  Will consult palliative care.   #5 AKI-single kidney status post nephrectomy for renal cell carcinoma CKD stage III AA continue fluid hydration avoid nephrotoxins follow-up labs   #6 history of hypertension with hypotension on midodrine prior to admission   #7 history of systolic heart failure with TAVR will monitor closely on IV fluids  #8 dehisced postop lumbar wound/surgical wound he had surgery on September 06, 2023 with purulent drainage wound care notes reviewed appreciate input. Clean back wound with Vashe [place] # [phone]), pat dry. 2. apply Silver Hydrofiber (Aquacel [employer] Hart [place] [phone]) cut to fit wound bed daily,  use a Q tip applicator to insert into wound bed leaving a tail of silver hanging out of wound for easy removal.    3. Cover with dry gauze and silicone foam. May lift foam daily to replace silver.  Change foam q3 days and prn soiling.  Moisten silver with NS if stuck to wound bed for atraumatic removal.  #9 stage II coccyx pressure injury present on admission-continue care with  foam dressing  Pressure Injury 11/18/23 Coccyx Medial Stage 2 -  Partial thickness loss of dermis presenting as a shallow open injury with a red, pink wound bed without slough. Pink wound with avulsion (Active)  11/18/23 1929  Location: Coccyx  Location Orientation: Medial  Staging: Stage 2 -  Partial thickness loss of dermis presenting as a shallow open injury with a red, pink wound bed without slough.   Wound Description (Comments): Pink wound with avulsion  Present on Admission: Yes  Dressing Type Foam - Lift dressing to assess site every shift 11/19/23 0732    Estimated body mass index is 26.33 kg/m as calculated from the following:   Height as of 10/29/23: 5\' 6"  (1.676 m).   Weight as of 10/28/23: 74 kg.  DVT prophylaxis: lovenox Code Status:dnr Family Communication: Disposition Plan:  Status is: Inpatient Remains inpatient appropriate because: aki    Consultants:  NONE  Procedures: none Antimicrobials: rocephin  Subjective: Little more awake than yesterday Still does not follow commands Appreciate wound nurse  Objective: Vitals:   11/19/23 0237 11/19/23 0508 11/19/23 0625 11/19/23 1018  BP: 103/68 (!) 123/56 120/68 135/74  Pulse:  (!) 118 (!) 110 (!) 110  Resp: (!) 22  (!) 22 16  Temp: 99.6 F (37.6 C) 99.4 F (37.4 C) 98.2 F (36.8 C) 98.9 F (37.2 C)  TempSrc: Oral Oral Oral Oral  SpO2: 100% 98% 98% 100%    Intake/Output Summary (Last 24 hours) at 11/19/2023 1251 Last data filed at 11/19/2023 0600 Gross per 24 hour  Intake 2100.38 ml  Output --  Net 2100.38 ml   There were no vitals filed for this visit.  Examination:  General exam: Appears chronically ill-appearing frail cachectic  respiratory system: Clear to auscultation. Respiratory effort normal. Cardiovascular system: S1 & S2 heard, RRR. No JVD, murmurs, rubs, gallops or clicks. No pedal edema. Gastrointestinal system: Abdomen is nondistended, soft and nontender. No organomegaly or masses felt. Normal bowel sounds heard. Central nervous system: Awake but does not follow any commands or answer questions does move all the extremities Extremities: No edema   Data Reviewed: I have personally reviewed following labs and imaging studies  CBC: Recent Labs  Lab 11/18/23 1318 11/19/23 0419  WBC 12.7* 12.1*  NEUTROABS 8.6*  --   HGB 11.0* 9.6*  HCT 34.7* 33.0*  MCV 95.6 100.6*  PLT 446* 345    Basic Metabolic Panel: Recent Labs  Lab 11/18/23 1318 11/19/23 0419  NA 147* 148*  K 3.7 3.8  CL 112* 116*  CO2 25 24  GLUCOSE 185* 158*  BUN 56* 43*  CREATININE 1.83* 1.58*  CALCIUM 13.5* 12.6*   GFR: CrCl cannot be calculated (Unknown ideal weight.). Liver Function Tests: Recent Labs  Lab 11/18/23 1318 11/19/23 0419  AST 23 20  ALT 13 13  ALKPHOS 62 54  BILITOT 0.4 0.7  PROT 7.5 6.4*  ALBUMIN 2.8* 2.5*   No results for input(s): "LIPASE", "AMYLASE" in the last 168 hours. No results for input(s): "AMMONIA" in the last 168 hours. Coagulation Profile: Recent Labs  Lab 11/18/23 1318  INR 1.3*   Cardiac Enzymes: Recent Labs  Lab 11/18/23 1318  CKTOTAL 157   BNP (last 3 results) No results for input(s): "PROBNP" in the last 8760 hours. HbA1C: No results for input(s): "HGBA1C" in the last 72 hours. CBG: Recent Labs  Lab 11/18/23 1827 11/18/23 2327 11/19/23 0751 11/19/23 1147  GLUCAP 160* 144* 145* 153*   Lipid Profile: No results for  input(s): "CHOL", "HDL", "LDLCALC", "TRIG", "CHOLHDL", "LDLDIRECT" in the last 72 hours. Thyroid Function Tests: No results for input(s): "TSH", "T4TOTAL", "FREET4", "T3FREE", "THYROIDAB" in the last 72 hours. Anemia Panel: No results for input(s): "VITAMINB12", "FOLATE", "FERRITIN", "TIBC", "IRON", "RETICCTPCT" in the last 72 hours. Sepsis Labs: Recent Labs  Lab 11/18/23 1318 11/18/23 1334  PROCALCITON 0.26  --   LATICACIDVEN  --  1.3    Recent Results (from the past 240 hour(s))  Urine Culture     Status: None   Collection Time: 11/10/23  5:40 PM   Specimen: In/Out Cath Urine  Result Value Ref Range Status   Specimen Description   Final    IN/OUT CATH URINE Performed at [facility], [address] [place], [place] [address]    Special Requests   Final    NONE Performed at [facility], [address] [place], [place] [address]    Culture   Final    NO  GROWTH Performed at [facility], [address], [place], [place] [address]    Report Status 11/11/2023 FINAL  Final  Culture, blood (routine x 2)     Status: None   Collection Time: 11/10/23  5:45 PM   Specimen: BLOOD  Result Value Ref Range Status   Specimen Description   Final    BLOOD LEFT ANTECUBITAL Performed at [facility], [address] [place], [place] [address]    Special Requests   Final    BOTTLES DRAWN AEROBIC AND ANAEROBIC Blood Culture results may not be optimal due to an excessive volume of blood received in culture bottles Performed at [facility], [address], [place], [place] [address]    Culture   Final    NO GROWTH 5 DAYS Performed at [facility], [address] [place], [place] [address]    Report Status 11/15/2023 FINAL  Final  Culture, blood (routine x 2)     Status: Abnormal   Collection Time: 11/10/23  8:34 PM   Specimen: BLOOD  Result Value Ref Range Status   Specimen Description   Final    BLOOD SITE NOT SPECIFIED Performed at [facility], [address] [place], [place] [address]    Special Requests   Final    BOTTLES DRAWN AEROBIC AND ANAEROBIC Blood Culture adequate volume Performed at [facility], [address], [place], [place] [address]    Culture  Setup Time   Final    GRAM POSITIVE COCCI IN CLUSTERS ANAEROBIC BOTTLE ONLY Organism ID to follow CRITICAL RESULT CALLED TO, READ BACK BY AND VERIFIED WITH: K GRAY,RN@0110  11/12/23 MK    Culture (A)  Final    STAPHYLOCOCCUS EPIDERMIDIS THE SIGNIFICANCE OF ISOLATING THIS ORGANISM FROM A SINGLE SET OF BLOOD CULTURES WHEN MULTIPLE SETS ARE DRAWN IS UNCERTAIN. PLEASE NOTIFY THE MICROBIOLOGY DEPARTMENT WITHIN ONE WEEK IF SPECIATION AND SENSITIVITIES ARE REQUIRED. Performed at [facility], [address], [place], [place] [address]    Report Status 11/13/2023 FINAL  Final  Blood Culture ID  Panel (Reflexed)     Status: Abnormal   Collection Time: 11/10/23  8:34 PM  Result Value Ref Range Status   Enterococcus faecalis NOT DETECTED NOT DETECTED Final   Enterococcus Faecium NOT DETECTED NOT DETECTED Final   Listeria monocytogenes NOT DETECTED NOT DETECTED Final   Staphylococcus species DETECTED (A) NOT DETECTED Final    Comment: CRITICAL RESULT CALLED TO,  READ BACK BY AND VERIFIED WITH: K GRAY,RN@0110  11/12/23 MK    Staphylococcus aureus (BCID) NOT DETECTED NOT DETECTED Final   Staphylococcus epidermidis DETECTED (A) NOT DETECTED Final    Comment: Methicillin (oxacillin) resistant coagulase negative staphylococcus. Possible blood culture contaminant (unless isolated from more than one blood culture draw or clinical case suggests pathogenicity). No antibiotic treatment is indicated for blood  culture contaminants. CRITICAL RESULT CALLED TO, READ BACK BY AND VERIFIED WITH: K GRAY,RN@0110  11/12/23 MK    Staphylococcus lugdunensis NOT DETECTED NOT DETECTED Final   Streptococcus species NOT DETECTED NOT DETECTED Final   Streptococcus agalactiae NOT DETECTED NOT DETECTED Final   Streptococcus pneumoniae NOT DETECTED NOT DETECTED Final   Streptococcus pyogenes NOT DETECTED NOT DETECTED Final   A.calcoaceticus-baumannii NOT DETECTED NOT DETECTED Final   Bacteroides fragilis NOT DETECTED NOT DETECTED Final   Enterobacterales NOT DETECTED NOT DETECTED Final   Enterobacter cloacae complex NOT DETECTED NOT DETECTED Final   Escherichia coli NOT DETECTED NOT DETECTED Final   Klebsiella aerogenes NOT DETECTED NOT DETECTED Final   Klebsiella oxytoca NOT DETECTED NOT DETECTED Final   Klebsiella pneumoniae NOT DETECTED NOT DETECTED Final   Proteus species NOT DETECTED NOT DETECTED Final   Salmonella species NOT DETECTED NOT DETECTED Final   Serratia marcescens NOT DETECTED NOT DETECTED Final   Haemophilus influenzae NOT DETECTED NOT DETECTED Final   Neisseria meningitidis NOT DETECTED NOT  DETECTED Final   Pseudomonas aeruginosa NOT DETECTED NOT DETECTED Final   Stenotrophomonas maltophilia NOT DETECTED NOT DETECTED Final   Candida albicans NOT DETECTED NOT DETECTED Final   Candida auris NOT DETECTED NOT DETECTED Final   Candida glabrata NOT DETECTED NOT DETECTED Final   Candida krusei NOT DETECTED NOT DETECTED Final   Candida parapsilosis NOT DETECTED NOT DETECTED Final   Candida tropicalis NOT DETECTED NOT DETECTED Final   Cryptococcus neoformans/gattii NOT DETECTED NOT DETECTED Final   Methicillin resistance mecA/C DETECTED (A) NOT DETECTED Final    Comment: CRITICAL RESULT CALLED TO, READ BACK BY AND VERIFIED WITH: K GRAY,RN@0110  11/12/23 MK Performed at [facility], [address], [place], [place] [address]   Blood Culture (routine x 2)     Status: None (Preliminary result)   Collection Time: 11/18/23  1:16 PM   Specimen: BLOOD RIGHT WRIST  Result Value Ref Range Status   Specimen Description   Final    BLOOD RIGHT WRIST Performed at [facility], [address], [place], [place] [address]    Special Requests   Final    BOTTLES DRAWN AEROBIC AND ANAEROBIC Blood Culture results may not be optimal due to an excessive volume of blood received in culture bottles Performed at [facility], [address], [place], [place] [address]    Culture   Final    NO GROWTH < 24 HOURS Performed at [facility], [address], [place], [place] [address]    Report Status PENDING  Incomplete  Blood Culture (routine x 2)     Status: None (Preliminary result)   Collection Time: 11/18/23  1:18 PM   Specimen: BLOOD  Result Value Ref Range Status   Specimen Description   Final    BLOOD RIGHT ANTECUBITAL Performed at [facility], [address], [place], [place] [address]    Special Requests   Final    BOTTLES DRAWN AEROBIC AND ANAEROBIC Blood Culture results may not be optimal due to an excessive volume of blood received  in culture bottles Performed at [facility] [address] [place], [place] [address]    Culture   Final    NO GROWTH < 24 HOURS Performed at [facility], [address] [place], [place] [address]    Report Status PENDING  Incomplete  Resp panel by RT-PCR (RSV, Flu A&B, Covid) Anterior Nasal Swab     Status: None   Collection Time: 11/18/23  3:27 PM   Specimen: Anterior Nasal Swab  Result Value Ref Range Status   SARS Coronavirus 2 by RT PCR NEGATIVE NEGATIVE Final    Comment: (NOTE) SARS-CoV-2 target nucleic acids are NOT DETECTED.  The SARS-CoV-2 RNA is generally detectable in upper respiratory specimens during the acute phase of infection. The lowest concentration of SARS-CoV-2 viral copies this assay can detect is 138 copies/mL. A negative result does not preclude SARS-Cov-2 infection and should not be used as the sole basis for treatment or other patient management decisions. A negative result may occur with  improper specimen collection/handling, submission of specimen other than nasopharyngeal swab, presence of viral mutation(s) within the areas targeted by this assay, and inadequate number of viral copies(<138 copies/mL). A negative result must be combined with clinical observations, patient history, and epidemiological information. The expected result is Negative.  Fact Sheet for Patients:  BloggerCourse.com  Fact Sheet for Healthcare Providers:  SeriousBroker.it  This test is no t yet approved or cleared by the [place] FDA and  has been authorized for detection and/or diagnosis of SARS-CoV-2 by FDA under an Emergency Use Authorization (EUA). This EUA will remain  in effect (meaning this test can be used) for the duration of the COVID-19 declaration under Section 564(b)(1) of the Act, 21 U.S.C.section 360bbb-3(b)(1), unless the authorization is terminated  or revoked sooner.        Influenza A by PCR NEGATIVE NEGATIVE Final   Influenza B by PCR NEGATIVE NEGATIVE Final    Comment: (NOTE) The Xpert Xpress SARS-CoV-2/FLU/RSV plus assay is intended as an aid in the diagnosis of influenza from Nasopharyngeal swab specimens and should not be used as a sole basis for treatment. Nasal washings and aspirates are unacceptable for Xpert Xpress SARS-CoV-2/FLU/RSV testing.  Fact Sheet for Patients: BloggerCourse.com  Fact Sheet for Healthcare Providers: SeriousBroker.it  This test is not yet approved or cleared by the [place] FDA and has been authorized for detection and/or diagnosis of SARS-CoV-2 by FDA under an Emergency Use Authorization (EUA). This EUA will remain in effect (meaning this test can be used) for the duration of the COVID-19 declaration under Section 564(b)(1) of the Act, 21 U.S.C. section 360bbb-3(b)(1), unless the authorization is terminated or revoked.     Resp Syncytial Virus by PCR NEGATIVE NEGATIVE Final    Comment: (NOTE) Fact Sheet for Patients: BloggerCourse.com  Fact Sheet for Healthcare Providers: SeriousBroker.it  This test is not yet approved or cleared by the [place] FDA and has been authorized for detection and/or diagnosis of SARS-CoV-2 by FDA under an Emergency Use Authorization (EUA). This EUA will remain in effect (meaning this test can be used) for the duration of the COVID-19 declaration under Section 564(b)(1) of the Act, 21 U.S.C. section 360bbb-3(b)(1), unless the authorization is terminated or revoked.  Performed at [facility], [address], [place], [place] [address]          Radiology Studies: CT Head Wo Contrast  Result Date: 11/18/2023 CLINICAL DATA:  Mental status change, unknown cause EXAM: CT  HEAD WITHOUT CONTRAST TECHNIQUE: Contiguous axial images were obtained from the  base of the skull through the vertex without intravenous contrast. RADIATION DOSE REDUCTION: This exam was performed according to the departmental dose-optimization program which includes automated exposure control, adjustment of the mA and/or kV according to patient size and/or use of iterative reconstruction technique. COMPARISON:  11/10/2023 FINDINGS: Brain: No evidence of acute infarction, hemorrhage, hydrocephalus, extra-axial collection or mass lesion/mass effect. Patchy low-density changes within the periventricular and subcortical white matter most compatible with chronic microvascular ischemic change. Moderate diffuse cerebral volume loss. Vascular: Atherosclerotic calcifications involving the large vessels of the skull base. No unexpected hyperdense vessel. Skull: Negative for calvarial fracture. Similar thinning of the parietal calvarium bilaterally. Sinuses/Orbits: No acute finding. Other: None. IMPRESSION: 1. No acute intracranial findings. 2. Chronic microvascular ischemic change and cerebral volume loss. Electronically Signed   By: Duanne Guess D.O.   On: 11/18/2023 14:21   DG Chest Port 1 View  Result Date: 11/18/2023 CLINICAL DATA:  Sepsis EXAM: PORTABLE CHEST 1 VIEW COMPARISON:  X-ray 11/10/2023. older exams FINDINGS: Sternal wires. Normal cardiopericardial silhouette. Calcified aorta. No consolidation, pneumothorax or effusion. No edema. Status post TAVR. Overlapping cardiac leads. Degenerative changes of the spine and shoulders. IMPRESSION: Postop chest. Status post TAVR. Underinflation. No acute cardiopulmonary disease Electronically Signed   By: Karen Kays M.D.   On: 11/18/2023 13:31     Scheduled Meds:  enoxaparin (LOVENOX) injection  30 mg Subcutaneous Q24H   insulin aspart  0-6 Units Subcutaneous TID WC   Continuous Infusions:  cefTRIAXone (ROCEPHIN)  IV Stopped (11/18/23 1502)   dextrose 100 mL/hr at 11/19/23 1106   lactated ringers 100 mL/hr at 11/18/23 1858    pamidronate       LOS: 1 day    Time spent: 39 min  Alwyn Ren, MD   11/19/2023, 12:51 PM

## 2023-11-19 NOTE — Progress Notes (Signed)
Rapid Response Event Note   Reason for Call : Stopped by in SWOT Rounding.   Initial Focused Assessment: Primary RN already had contacted AM MD for Hr 120's and RR 30's.  Recently given Tylenol for temperature.  Interventions: Night RN placed him back on Oxygen 2 L via [place].     Plan of Care:  Already given medications and will give another dose of Tylenol to prevent sepsis.   Event Summary:   Will continue to monitor.  Recheck MEWS went from 5 to 2.  On-call NP gave orders.  MD Notified: Primary MD - AM shift Call Time: Prior to shift change Arrival Time: 2110 End Time: 2120  Peter Minium, RN

## 2023-11-19 NOTE — TOC Initial Note (Signed)
Transition of Care [facility]) - Initial/Assessment Note    Patient Details  Name: [name] MRN: [id number] Date of Birth: 04-01-1934  [facility]) CM/SW Contact:    [name] Phone Number: 11/19/2023, 1:51 PM  Clinical Narrative:                 Pt from [facility] where he resides with his spouse. Pt is active with [facility] for palliative care services. Spoke with pt's nephew who is hopeful pt will be able to return to [place] at discharge.   Expected Discharge Plan: Long Term Nursing Home Barriers to Discharge: Continued Medical Work up   Patient Goals and CMS Choice Patient states their goals for this hospitalization and ongoing recovery are:: For pt to return to [facility]gov Compare Post Acute Care list provided to:: Patient Represenative (must comment) Choice offered to / list presented to : [facility] POA / Guardian [place] ownership interest in [facility].provided to:: [facility] POA / Guardian    Expected Discharge Plan and Services In-house Referral: Clinical Social Work Discharge Planning Services: NA Post Acute Care Choice: Resumption of Svcs/PTA Provider, Nursing Home Living arrangements for the past 2 months: Skilled Nursing Facility                 DME Arranged: N/A DME Agency: NA                  Prior Living Arrangements/Services Living arrangements for the past 2 months: Skilled Nursing Facility Lives with:: Spouse Patient language and need for interpreter reviewed:: Yes Do you feel safe going back to the place where you live?: Yes      Need for Family Participation in Patient Care: Yes (Comment) Care giver support system in place?: Yes (comment) Current home services: DME Criminal Activity/Legal Involvement Pertinent to Current Situation/Hospitalization: No - Comment as needed  Activities of Daily Living      Permission Sought/Granted Permission sought to share information with : Facility  Medical sales representative, Family Supports Permission granted to share information with : Yes, Verbal Permission Granted  Share Information with NAME: Nephew, [name]  Permission granted to share info w AGENCY: [facility]        Emotional Assessment   Attitude/Demeanor/Rapport: Unable to Assess Affect (typically observed): Unable to Assess Orientation: :  (Disoriented x 4) Alcohol / Substance Use: Not Applicable Psych Involvement: No (comment)  Admission diagnosis:  Somnolence [R40.0] Hypernatremia [E87.0] AKI (acute kidney injury) (HCC) [N17.9] Urinary tract infection with hematuria, site unspecified [N39.0, R31.9] Sepsis with acute renal failure without septic shock, due to unspecified organism, unspecified acute renal failure type (HCC) [A41.9, R65.20, N17.9] Patient Active Problem List   Diagnosis Date Noted   AKI (acute kidney injury) (HCC) 11/18/2023   Hypercalcemia 11/18/2023   Hypernatremia 11/18/2023   Dehydration 11/18/2023   AMS (altered mental status) 10/30/2023   Hematuria 10/29/2023   Acute renal failure superimposed on stage 3a chronic kidney disease (HCC) 10/28/2023   Acute metabolic encephalopathy 10/28/2023   Near syncope 10/28/2023   Physical deconditioning 10/28/2023   Transient hypotension 09/24/2023   Wound cellulitis after surgery 09/24/2023   S/P CABG (coronary artery bypass graft) 07/12/2023   Myalgia due to statin 07/12/2023   Chronic pain syndrome 09/08/2022   Lumbar radiculopathy 08/06/2022   History of renal cell cancer    Diabetes mellitus (HCC)    Prostate cancer (HCC)    Coronary artery disease    Chronic combined  systolic and diastolic heart failure (HCC)    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   Discitis 12/22/2015   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   Sepsis (HCC) 10/26/2015   CKD (chronic kidney disease), stage III (HCC) 10/26/2015   Essential hypertension 02/11/2015   Hyperlipidemia associated with type  2 diabetes mellitus (HCC)    GERD (gastroesophageal reflux disease)    H/O blood clots    Low back pain    Polymyalgia (HCC)    Gout    Glaucoma    PCP:  Steven Northern, MD Pharmacy:   [facility] - [place], [place] - [address] [place] [address] Phone: [phone] Fax: [phone]     Social Determinants of Health (SDOH) Social History: SDOH Screenings   Food Insecurity: Patient Unable To Answer (11/19/2023)  Housing: Patient Unable To Answer (10/28/2023)  Recent Concern: Housing - High Risk (09/24/2023)  Transportation Needs: Patient Unable To Answer (11/19/2023)  Utilities: Patient Unable To Answer (11/19/2023)  Social Connections: Unknown (05/05/2022)   Received from [facility]  Stress: No Stress Concern Present (09/06/2023)   Received from [facility]  Tobacco Use: Medium Risk (10/28/2023)   SDOH Interventions:     Readmission Risk Interventions    11/19/2023    1:45 PM 11/03/2023    1:08 PM  Readmission Risk Prevention Plan  Transportation Screening Complete Complete  PCP or Specialist Appt within 5-7 Days  Complete  Home Care Screening  Complete  Medication Review (RN CM)  Complete  Medication Review (RN Care Manager) Complete   PCP or Specialist appointment within 3-5 days of discharge Complete   HRI or Home Care Consult Complete   SW Recovery Care/Counseling Consult Complete   Palliative Care Screening Complete   Skilled Nursing Facility Complete

## 2023-11-20 ENCOUNTER — Inpatient Hospital Stay (HOSPITAL_COMMUNITY): Payer: PPO

## 2023-11-20 DIAGNOSIS — N179 Acute kidney failure, unspecified: Secondary | ICD-10-CM | POA: Diagnosis not present

## 2023-11-20 LAB — COMPREHENSIVE METABOLIC PANEL
ALT: 13 U/L (ref 0–44)
AST: 29 U/L (ref 15–41)
Albumin: 2.2 g/dL — ABNORMAL LOW (ref 3.5–5.0)
Alkaline Phosphatase: 53 U/L (ref 38–126)
Anion gap: 8 (ref 5–15)
BUN: 31 mg/dL — ABNORMAL HIGH (ref 8–23)
CO2: 26 mmol/L (ref 22–32)
Calcium: 11.7 mg/dL — ABNORMAL HIGH (ref 8.9–10.3)
Chloride: 109 mmol/L (ref 98–111)
Creatinine, Ser: 1.44 mg/dL — ABNORMAL HIGH (ref 0.61–1.24)
GFR, Estimated: 47 mL/min — ABNORMAL LOW (ref >60)
Glucose, Bld: 186 mg/dL — ABNORMAL HIGH (ref 70–99)
Potassium: 3.5 mmol/L (ref 3.5–5.1)
Sodium: 143 mmol/L (ref 135–145)
Total Bilirubin: 0.4 mg/dL (ref <1.2)
Total Protein: 6.2 g/dL — ABNORMAL LOW (ref 6.5–8.1)

## 2023-11-20 LAB — GLUCOSE, CAPILLARY
Glucose-Capillary: 161 mg/dL — ABNORMAL HIGH (ref 70–99)
Glucose-Capillary: 165 mg/dL — ABNORMAL HIGH (ref 70–99)
Glucose-Capillary: 146 mg/dL — ABNORMAL HIGH (ref 70–99)
Glucose-Capillary: 128 mg/dL — ABNORMAL HIGH (ref 70–99)

## 2023-11-20 LAB — CBC
HCT: 31.9 % — ABNORMAL LOW (ref 39.0–52.0)
Hemoglobin: 9.4 g/dL — ABNORMAL LOW (ref 13.0–17.0)
MCH: 29.5 pg (ref 26.0–34.0)
MCHC: 29.5 g/dL — ABNORMAL LOW (ref 30.0–36.0)
MCV: 100 fL (ref 80.0–100.0)
Platelets: 321 10*3/uL (ref 150–400)
RBC: 3.19 MIL/uL — ABNORMAL LOW (ref 4.22–5.81)
RDW: 15.2 % (ref 11.5–15.5)
WBC: 11.8 10*3/uL — ABNORMAL HIGH (ref 4.0–10.5)
nRBC: 0 % (ref 0.0–0.2)

## 2023-11-20 MED ORDER — HYDROMORPHONE HCL 1 MG/ML IJ SOLN
0.5000 mg | INTRAMUSCULAR | Status: DC | PRN
  Administered 2023-11-20 – 2023-11-22 (×3): 0.5 mg via INTRAVENOUS
  Filled 2023-11-20 (×3): qty 0.5

## 2023-11-20 MED ORDER — SODIUM CHLORIDE 0.9 % IV SOLN
2.0000 g | Freq: Two times a day (BID) | INTRAVENOUS | Status: DC
  Administered 2023-11-20 – 2023-11-23 (×7): 2 g via INTRAVENOUS
  Filled 2023-11-20 (×7): qty 12.5

## 2023-11-20 NOTE — Progress Notes (Signed)
Pharmacy Antibiotic Note  [name] is a 89 y.o. male admitted on 11/18/2023 with UTI.  Pharmacy has been consulted for cefepime dosing.  Pt remains febrile on ceftriaxone, urine culture growing Pseudomonas. Antibiotics being broadened from ceftriaxone to cefepime.   Today, 11/20/23 CrCl = 34 mL/min using TBW 74 kg, SCr 1.6  Plan: Cefepime 2 g IV q12h Monitor renal function, culture data    Temp (24hrs), Avg:99.5 F (37.5 C), Min:98.3 F (36.8 C), Max:101.7 F (38.7 C)  Recent Labs  Lab 11/18/23 1318 11/18/23 1334 11/19/23 0419  WBC 12.7*  --  12.1*  CREATININE 1.83*  --  1.58*  LATICACIDVEN  --  1.3  --     CrCl cannot be calculated (Unknown ideal weight.).    Allergies  Allergen Reactions   Fish Allergy Swelling    Dark fish- salmon, tuna    Lipitor [Atorvastatin] Other (See Comments)    Muscle pain   Penicillins Swelling    SWELLING OF THE FEET  Has patient had a PCN reaction causing immediate rash, facial/tongue/throat swelling, SOB or lightheadedness with hypotension: no Has patient had a PCN reaction causing severe rash involving mucus membranes or skin necrosis: no Has patient had a PCN reaction that required hospitalization no Has patient had a PCN reaction occurring within the last 10 years: no If all of the above answers are "NO", then may proceed with Cephalosporin use.   Simvastatin Other (See Comments)    MUSCLE PAIN   Adacel [Tetanus-Diphth-Acell Pertussis]     UNSPECIFIED REACTION    Lasix [Furosemide] Swelling    SWELLING REACTION UNSPECIFIED    Septra [Sulfamethoxazole-Trimethoprim]     UNSPECIFIED REACTION    Flexeril [Cyclobenzaprine] Other (See Comments)    Causes confusion   Pantoprazole Diarrhea    Antimicrobials this admission: ceftriaxone 11/28 >> 11/29 cefepime 11/30 >>   Dose adjustments this admission:  Microbiology results: 11/28 BCx: ngtd 11/28 UCx: >100k Pseudomonas aeruginosa    Cindi Carbon, PharmD 11/20/2023  10:57 AM

## 2023-11-20 NOTE — Consult Note (Signed)
Consultation Note Date: 11/20/2023   Patient Name: [name]  DOB: 1934/02/27  MRN: [id number]  Age / Sex: 89 y.o., male  PCP: [name], MD Referring Physician: Alwyn Ren, MD  Reason for Consultation: Establishing goals of care  HPI/Patient Profile: 89 y.o. male  admitted on 11/18/2023   89 y.o. male with medical history significant of stage III CKD stage IIIa, renal cell carcinoma status post nephrectomy, bladder cancer, prostate cancer status postradiation, hypertension hyperlipidemia recent TAVR, CABG, type 2 diabetes, systolic heart failure, chronic back pain recent back surgery, gout, GERD, he had back surgery in October 2024 complicated by postsurgical wound infection cellulitis and UTI.  He was discharged to [facility] in October and has been living at [facility] now under long-term care.  He was sent to the hospital due to change in mental status decreased p.o. intake no appetite for the last 5 to 6 weeks.   Clinical Assessment and Goals of Care: Patient remains admitted to hospital medicine service for hypercalcemia of dehydration in the setting of multiple malignancies, patient also met sepsis criteria, UTI urine culture Pseudomonas.  Has acute kidney injury has single kidney status post nephrectomy for renal cell cancer has underlying stage III chronic kidney disease has history of hypertension systolic heart failure with TAVR. Has dehisced postop lumbar wound/surgical wound for which she had surgery on September 06, 2023.  Wound care instructions noted. Remains admitted to hospital medicine service, antibiotics were expanded from Rocephin to cefepime in light of Pseudomonas in the urine. Initial CODE STATUS and broad goals of care discussions were undertaken by primary service and CODE STATUS was revised to DNR/DNI Palliative consult for additional support has been requested Chart  reviewed, patient seen and examined, discussed with TRH MD.  Call placed and was able to reach nephew. Palliative medicine is specialized medical care for people living with serious illness. It focuses on providing relief from the symptoms and stress of a serious illness. The goal is to improve quality of life for both the patient and the family. Goals of care: Broad aims of medical therapy in relation to the patient's values and preferences. Our aim is to provide medical care aimed at enabling patients to achieve the goals that matter most to them, given the circumstances of their particular medical situation and their constraints.    NEXT OF KIN  Wife niece Doreene Eland [phone]  SUMMARY OF RECOMMENDATIONS   Upon chart review, it is noted that that author care palliative has been following the patient at [facility].  Monitor hospital course and recommend switching from palliative to hospice services when the patient goes back to [facility], once antibiotics are done. Continue current mode of care Agree with DNR Thank you for the consult.   Code Status/Advance Care Planning: DNR   Symptom Management:     Palliative Prophylaxis:  Delirium Protocol    Psycho-social/Spiritual:  Desire for further Chaplaincy support:yes Additional Recommendations: Caregiving  Support/Resources  Prognosis:  Unable to determine  Discharge Planning: To  Be Determined      Primary Diagnoses: Present on Admission:  AKI (acute kidney injury) (HCC)  Sepsis (HCC)   I have reviewed the medical record, interviewed the patient and family, and examined the patient. The following aspects are pertinent.  Past Medical History:  Diagnosis Date   Anemia due to chronic kidney disease    Benign localized prostatic hyperplasia with lower urinary tract symptoms (LUTS)    Bladder cancer (HCC) 04/2023   dr dahlstedt   Carpal tunnel syndrome on both sides    Chronic constipation    Chronic gout without  tophus    followed by pcp    (05-07-2023  per pt last flare-up 6-8 months ago, usually affects bilateral great toes)   Chronic low back pain    per pt s/p RFA's   Chronic narcotic use    Chronic systolic CHF (congestive heart failure) (HCC)    followed by cardiology;  preserved ef   CKD (chronic kidney disease), stage III (HCC) 2017   previously seen by nephrologsit--- dr Hyman Hopes   Coronary artery disease 1993   cardiologist--- dr h. Katrinka Blazing;   1993-- s/p cath w/ PTCA to occluded RCA;  a. 2007: s/p CABG x4 (LIMA--> LAD, rSVG--> RI, rSVG-seq -->dRCA and PLA))  c. 2017: LHC with 2/3 patent bypass grafts with occuluded SVG--> RI   DDD (degenerative disc disease), lumbosacral    GERD (gastroesophageal reflux disease)    History of adenomatous polyp of colon    History of cardiomyopathy 09/2016   in setting severe aortic stenosis and new acute on chronic systolic CHF, ef 56-21%   History of DVT of lower extremity 2008   superficial thrombophlebitis-- R Lower leg, right upper leg   History of polymyalgia rheumatica 2011   per pt hx was treated and resolved after being dx approx 2011   History of prostate cancer 2009   urologist--- dr Retta Diones;   dx 2009,  Gleason 4+3;   02-08-2009  s/p radioactive prostate seed implants by dr Vonita Moss  (05-07-2023  PSA undetectable)   History of renal cell cancer 2014   primary urologist-- dr Frederich Cha;   incidental finding on imaging for back , left renal mass;   08-31-2013  s/p partial left nephrecotmy   History of septic shock 2016   post op  lumbar back surgery, positive blood culture  (discitis/ ostomyelitis)   Hyperlipidemia    Hypertension    Peripheral neuropathy    hands and feet   Pulmonary nodule, right    solitary ,  last Chest CT in epic 12-08-2018 stable   S/P aortic valve replacement with prosthetic valve 07/02/2017   @[facility]  by Dr Laneta Simmers w/ [employer] prosthesis  for severe aortic valve stenosis  (nonrheumatic)   Secondary hyperparathyroidism  of renal origin (HCC)    Type 2 diabetes mellitus (HCC)    followed by pcp;   (05-07-2023  per pt only check blood sugar occasionlly)   Wears partial dentures    upper   Social History   Socioeconomic History   Marital status: Married    Spouse name: Not on file   Number of children: Not on file   Years of education: Not on file   Highest education level: Not on file  Occupational History   Not on file  Tobacco Use   Smoking status: Former    Current packs/day: 0.00    Average packs/day: 3.0 packs/day for 30.0 years (90.0 ttl pk-yrs)    Types: Cigarettes  Start date: 81    Quit date: 1982    Years since quitting: 42.9   Smokeless tobacco: Never  Vaping Use   Vaping status: Never Used  Substance and Sexual Activity   Alcohol use: No   Drug use: Never   Sexual activity: Not on file  Other Topics Concern   Not on file  Social History Narrative   Lives at home with wife   Right handed   Caffeine: 1 cup/day, occassionally has a 2nd    Social Determinants of Health   Financial Resource Strain: Not on file  Food Insecurity: Patient Unable To Answer (11/19/2023)   Hunger Vital Sign    Worried About Running Out of Food in the Last Year: Patient unable to answer    Ran Out of Food in the Last Year: Patient unable to answer  Transportation Needs: Patient Unable To Answer (11/19/2023)   PRAPARE - Transportation    Lack of Transportation (Medical): Patient unable to answer    Lack of Transportation (Non-Medical): Patient unable to answer  Physical Activity: Not on file  Stress: No Stress Concern Present (09/06/2023)   Received from [facility] - Occupational Stress Questionnaire    Feeling of Stress : Not at all  Social Connections: Unknown (05/05/2022)   Received from [employer], [facility]   Social Network    Social Network: Not on file   Family History  Problem Relation Age of Onset   Heart Problems Mother     Heart Problems Father    Neuropathy Neg Hx    Scheduled Meds:  enoxaparin (LOVENOX) injection  30 mg Subcutaneous Q24H   insulin aspart  0-6 Units Subcutaneous TID WC   Continuous Infusions:  ceFEPime (MAXIPIME) IV 2 g (11/20/23 1304)   PRN Meds:.acetaminophen **OR** acetaminophen, HYDROmorphone (DILAUDID) injection, metoprolol tartrate Medications Prior to Admission:  Prior to Admission medications   Medication Sig Start Date End Date Taking? Authorizing Provider  acetaminophen (TYLENOL) 500 MG tablet Take 1 tablet (500 mg total) by mouth 3 (three) times daily. 11/05/23  Yes Sheikh, Omair Latif, DO  aspirin 81 MG chewable tablet Chew 81 mg by mouth every evening.   Yes [provider]  Ca Carbonate-Mag Hydroxide (ROLAIDS PO) Take by mouth as needed (reflux).   Yes [provider]  Cholecalciferol (VITAMIN D-3) 125 MCG (5000 UT) TABS Take 5,000 Units by mouth daily.   Yes [provider]  DULoxetine (CYMBALTA) 20 MG capsule Take 20 mg by mouth 2 (two) times daily.   Yes [provider]  Ensure Max Protein (ENSURE MAX PROTEIN) LIQD Take 330 mLs (11 oz total) by mouth daily. 10/01/23  Yes Narda Bonds, MD  famotidine (PEPCID) 20 MG tablet Take 1 tablet (20 mg total) by mouth daily. 11/05/23  Yes Sheikh, Omair Latif, DO  gabapentin (NEURONTIN) 100 MG capsule Take 100 mg by mouth every 6 (six) hours as needed (nerve pain).   Yes [provider]  lidocaine (LIDODERM) 5 % Place 1 patch onto the skin as needed. Remove & Discard patch within 12 hours or as directed by MD   Yes [provider]  LORazepam (ATIVAN) 0.5 MG tablet Take 0.5 mg by mouth every 8 (eight) hours as needed. 11/15/23  Yes [provider]  Multiple Vitamin (MULTIVITAMIN) tablet Take 1 tablet by mouth at bedtime.   Yes [provider]  nitroGLYCERIN (NITROSTAT) 0.4 MG SL tablet Place 1 tablet (0.4 mg total)  under the tongue every 5 (five) minutes as needed  for chest pain. 08/26/23  Yes Chandrasekhar, Mahesh A, MD  nutrition supplement, JUVEN, (JUVEN) PACK Take 1 packet by mouth 2 (two) times daily between meals. 09/30/23  Yes Narda Bonds, MD  nystatin cream (MYCOSTATIN) Apply 1 Application topically 3 (three) times daily.   Yes [provider]  ondansetron (ZOFRAN) 4 MG tablet Take 4 mg by mouth every 6 (six) hours as needed for nausea or vomiting.   Yes [provider]  polyethylene glycol (MIRALAX / GLYCOLAX) 17 g packet Take 17 g by mouth daily as needed for mild constipation. 09/30/23  Yes Narda Bonds, MD  Propylene Glycol (SYSTANE COMPLETE) 0.6 % SOLN Place 1 drop into the left eye 2 (two) times daily.   Yes [provider]  psyllium (HYDROCIL/METAMUCIL) 95 % PACK Take 1 packet by mouth daily as needed for mild constipation.   Yes [provider]  tamsulosin (FLOMAX) 0.4 MG CAPS capsule Take 0.4 mg by mouth 2 (two) times daily.   Yes [provider]  doxycycline (VIBRA-TABS) 100 MG tablet Take 1 tablet (100 mg total) by mouth every 12 (twelve) hours. Patient not taking: Reported on 11/19/2023 11/05/23   Marguerita Merles Latif, DO  midodrine (PROAMATINE) 10 MG tablet Take 1 tablet (10 mg total) by mouth 3 (three) times daily with meals. 11/04/23   Marguerita Merles Latif, DO   Allergies  Allergen Reactions   Fish Allergy Swelling    Dark fish- salmon, tuna    Lipitor [Atorvastatin] Other (See Comments)    Muscle pain   Penicillins Swelling    SWELLING OF THE FEET  Has patient had a PCN reaction causing immediate rash, facial/tongue/throat swelling, SOB or lightheadedness with hypotension: no Has patient had a PCN reaction causing severe rash involving mucus membranes or skin necrosis: no Has patient had a PCN reaction that required hospitalization no Has patient had a PCN reaction occurring within the last 10 years: no If all of the above answers are "NO", then may proceed with Cephalosporin use.    Simvastatin Other (See Comments)    MUSCLE PAIN   Adacel [Tetanus-Diphth-Acell Pertussis]     UNSPECIFIED REACTION    Lasix [Furosemide] Swelling    SWELLING REACTION UNSPECIFIED    Septra [Sulfamethoxazole-Trimethoprim]     UNSPECIFIED REACTION    Flexeril [Cyclobenzaprine] Other (See Comments)    Causes confusion   Pantoprazole Diarrhea   Review of Systems Mostly nonverbal Physical Exam Elderly gentleman Chronically ill-appearing frail and cachectic appearing Shallow regular breath sounds Abdomen is not distended Awake, tracks in the room but does not respond appropriately Does not have peripheral edema  Vital Signs: BP 104/64   Pulse (!) 102   Temp 98.3 F (36.8 C) (Axillary)   Resp (!) 22   SpO2 100%  Pain Scale: CPOT   Pain Score: 3    SpO2: SpO2: 100 % O2 Device:SpO2: 100 % O2 Flow Rate: .O2 Flow Rate (L/min): 2 L/min  IO: Intake/output summary:  Intake/Output Summary (Last 24 hours) at 11/20/2023 1439 Last data filed at 11/20/2023 1241 Gross per 24 hour  Intake 1434.97 ml  Output 700 ml  Net 734.97 ml    LBM: Last BM Date : 11/19/23 Baseline Weight:   Most recent weight:       Palliative Assessment/Data:   PPS 40%  Time In:  1340 Time Out:  1440 Time Total:  60  Greater than 50%  of this time  was spent counseling and coordinating care related to the above assessment and plan.  Signed by: Rosalin Hawking, MD   Please contact Palliative Medicine Team phone at [phone] for questions and concerns.  For individual provider: See Loretha Stapler

## 2023-11-20 NOTE — Progress Notes (Signed)
PROGRESS NOTE    [name]  BMW:[id number] DOB: 1934-01-04 DOA: 11/18/2023 PCP: [name], MD  Brief Narrative: 89 y.o. male with medical history significant of stage III CKD stage IIIa, renal cell carcinoma status post nephrectomy, bladder cancer, prostate cancer status postradiation, hypertension hyperlipidemia recent TAVR, CABG, type 2 diabetes, systolic heart failure, chronic back pain recent back surgery, gout, GERD, he had back surgery in October 2024 complicated by postsurgical wound infection cellulitis and UTI.  He was discharged to [facility] in October and has been living at [facility] now under long-term care.  He was sent to the hospital due to change in mental status decreased p.o. intake no appetite for the last 5 to 6 weeks.  Family denies nausea vomiting diarrhea.  No fever chills or cough reported.  He does not have a Foley catheter.  He does wear diaper and is dry when I saw him in the ER.  In the ER he was found to have a very elevated calcium of 13, hypernatremia sodium 147, UA consistent with dehydration and UTI with elevated white count of 12 tachypnea tachycardia and cytosis.  He is being admitted with severe dehydration hypercalcemia, hyponatremia and sepsis likely from urinary tract infection.  History was obtained from the patient's records and patient's wife who has memory issues lives in memory care and his niece was in the ER when I saw the patient.  CODE STATUS was discussed with patient with niece and agreed that DNR would be appropriate for him in this situation.  Patient appears very masticated with scaphoid belly and unable to follow commands appears extremely dry. He received Ringer lactate bolus and Rocephin in the ER.  Assessment & Plan:   Principal Problem:   AKI (acute kidney injury) (HCC) Active Problems:   Sepsis (HCC)   Hypercalcemia   Hypernatremia   Dehydration   #1 hypercalcemia of dehydration in the setting of multiple malignancies with poor  p.o. intake for 5 to 6 weeks.  Will continue IV fluid hydration follow-up labs pending Ca 12.6 from 13.5  S/p Pamidronate 11/29   #2 hypernatremia secondary to dehydration change IV fluids to D5 w follow-up labs in AM.   #3 patient met criteria for sepsis with leukocytosis tachypnea and tachycardia and fever with positive UA for UTI-urine culture pseudomonas dc rocephin start cefepime.  blood cultures pending.   #4 CODE STATUS DNR discussed with patient's wife and niece at the same time.  Will consult palliative care.   #5 AKI-single kidney status post nephrectomy for renal cell carcinoma CKD stage III AA continue fluid hydration avoid nephrotoxins follow-up labs   #6 history of hypertension with hypotension on midodrine prior to admission   #7 history of systolic heart failure with TAVR will monitor closely on IV fluids  #8 dehisced postop lumbar wound/surgical wound he had surgery on September 06, 2023 with purulent drainage wound care notes reviewed appreciate input. Clean back wound with Vashe [place] # [phone]), pat dry. apply Silver Hydrofiber (Aquacel [employer] [place] [phone]) cut to fit wound bed daily,  use a Q tip applicator to insert into wound bed leaving a tail of silver hanging out of wound for easy removal.   Cover with dry gauze and silicone foam. May lift foam daily to replace silver.  Change foam q3 days and prn soiling.  Moisten silver with NS if stuck to wound bed for atraumatic removal.  #9 stage II coccyx pressure injury present on admission-continue care with foam dressing  Pressure  Injury 11/18/23 Coccyx Medial Stage 2 -  Partial thickness loss of dermis presenting as a shallow open injury with a red, pink wound bed without slough. Pink wound with avulsion (Active)  11/18/23 1929  Location: Coccyx  Location Orientation: Medial  Staging: Stage 2 -  Partial thickness loss of dermis presenting as a shallow open injury with a red, pink wound bed without slough.  Wound  Description (Comments): Pink wound with avulsion  Present on Admission: Yes  Dressing Type Foam - Lift dressing to assess site every shift 11/19/23 0732    Estimated body mass index is 26.33 kg/m as calculated from the following:   Height as of 10/29/23: 5\' 6"  (1.676 m).   Weight as of 10/28/23: 74 kg.  DVT prophylaxis: lovenox Code Status:dnr Family Communication: Disposition Plan:  Status is: Inpatient Remains inpatient appropriate because: aki    Consultants:  NONE  Procedures: none Antimicrobials: rocephin  Subjective:  Asleep when I called his name he opened his eyes and looked at me Still does not follow commands Objective: Vitals:   11/19/23 2031 11/20/23 0001 11/20/23 0411 11/20/23 0757  BP: 116/66 114/63 122/65 104/62  Pulse: (!) 109 (!) 101 100 (!) 105  Resp: (!) 24 20 18  (!) 30  Temp: 99.5 F (37.5 C) 98.8 F (37.1 C) 99.1 F (37.3 C) 98.3 F (36.8 C)  TempSrc: Oral Oral Oral Oral  SpO2: 100% 98% 90% 96%    Intake/Output Summary (Last 24 hours) at 11/20/2023 1045 Last data filed at 11/20/2023 0836 Gross per 24 hour  Intake 1434.97 ml  Output 700 ml  Net 734.97 ml   There were no vitals filed for this visit.  Examination:  General exam: Appears chronically ill-appearing frail cachectic  respiratory system: Clear to auscultation. Respiratory effort normal. Cardiovascular system: S1 & S2 heard, RRR. No JVD, murmurs, rubs, gallops or clicks. No pedal edema. Gastrointestinal system: Abdomen is nondistended, soft and nontender. No organomegaly or masses felt. Normal bowel sounds heard. Central nervous system: Awake but does not follow any commands or answer questions does move all the extremities Extremities: No edema   Data Reviewed: I have personally reviewed following labs and imaging studies  CBC: Recent Labs  Lab 11/18/23 1318 11/19/23 0419  WBC 12.7* 12.1*  NEUTROABS 8.6*  --   HGB 11.0* 9.6*  HCT 34.7* 33.0*  MCV 95.6 100.6*  PLT 446*  345   Basic Metabolic Panel: Recent Labs  Lab 11/18/23 1318 11/19/23 0419  NA 147* 148*  K 3.7 3.8  CL 112* 116*  CO2 25 24  GLUCOSE 185* 158*  BUN 56* 43*  CREATININE 1.83* 1.58*  CALCIUM 13.5* 12.6*   GFR: CrCl cannot be calculated (Unknown ideal weight.). Liver Function Tests: Recent Labs  Lab 11/18/23 1318 11/19/23 0419  AST 23 20  ALT 13 13  ALKPHOS 62 54  BILITOT 0.4 0.7  PROT 7.5 6.4*  ALBUMIN 2.8* 2.5*   No results for input(s): "LIPASE", "AMYLASE" in the last 168 hours. No results for input(s): "AMMONIA" in the last 168 hours. Coagulation Profile: Recent Labs  Lab 11/18/23 1318  INR 1.3*   Cardiac Enzymes: Recent Labs  Lab 11/18/23 1318  CKTOTAL 157   BNP (last 3 results) No results for input(s): "PROBNP" in the last 8760 hours. HbA1C: No results for input(s): "HGBA1C" in the last 72 hours. CBG: Recent Labs  Lab 11/19/23 0751 11/19/23 1147 11/19/23 1743 11/19/23 2118 11/20/23 0845  GLUCAP 145* 153* 132* 137* 161*  Lipid Profile: No results for input(s): "CHOL", "HDL", "LDLCALC", "TRIG", "CHOLHDL", "LDLDIRECT" in the last 72 hours. Thyroid Function Tests: No results for input(s): "TSH", "T4TOTAL", "FREET4", "T3FREE", "THYROIDAB" in the last 72 hours. Anemia Panel: No results for input(s): "VITAMINB12", "FOLATE", "FERRITIN", "TIBC", "IRON", "RETICCTPCT" in the last 72 hours. Sepsis Labs: Recent Labs  Lab 11/18/23 1318 11/18/23 1334  PROCALCITON 0.26  --   LATICACIDVEN  --  1.3    Recent Results (from the past 240 hour(s))  Urine Culture     Status: None   Collection Time: 11/10/23  5:40 PM   Specimen: In/Out Cath Urine  Result Value Ref Range Status   Specimen Description   Final    IN/OUT CATH URINE Performed at [facility], [address] [place], [place] [address]    Special Requests   Final    NONE Performed at [facility], [address] [place], [place] [address]    Culture    Final    NO GROWTH Performed at [facility], [address] [place], [place] [address]    Report Status 11/11/2023 FINAL  Final  Culture, blood (routine x 2)     Status: None   Collection Time: 11/10/23  5:45 PM   Specimen: BLOOD  Result Value Ref Range Status   Specimen Description   Final    BLOOD LEFT ANTECUBITAL Performed at [facility], [address] [place], [place] [address]    Special Requests   Final    BOTTLES DRAWN AEROBIC AND ANAEROBIC Blood Culture results may not be optimal due to an excessive volume of blood received in culture bottles Performed at [facility], [address] [place], [place] [address]    Culture   Final    NO GROWTH 5 DAYS Performed at [facility], [address] [place], [place] [address]    Report Status 11/15/2023 FINAL  Final  Culture, blood (routine x 2)     Status: Abnormal   Collection Time: 11/10/23  8:34 PM   Specimen: BLOOD  Result Value Ref Range Status   Specimen Description   Final    BLOOD SITE NOT SPECIFIED Performed at [facility], [address], [place], [place] [address]    Special Requests   Final    BOTTLES DRAWN AEROBIC AND ANAEROBIC Blood Culture adequate volume Performed at [facility], [address], [place], [place] [address]    Culture  Setup Time   Final    GRAM POSITIVE COCCI IN CLUSTERS ANAEROBIC BOTTLE ONLY Organism ID to follow CRITICAL RESULT CALLED TO, READ BACK BY AND VERIFIED WITH: K GRAY,RN@0110  11/12/23 MK    Culture (A)  Final    STAPHYLOCOCCUS EPIDERMIDIS THE SIGNIFICANCE OF ISOLATING THIS ORGANISM FROM A SINGLE SET OF BLOOD CULTURES WHEN MULTIPLE SETS ARE DRAWN IS UNCERTAIN. PLEASE NOTIFY THE MICROBIOLOGY DEPARTMENT WITHIN ONE WEEK IF SPECIATION AND SENSITIVITIES ARE REQUIRED. Performed at [facility], [address] [place], [place] [address]    Report Status 11/13/2023 FINAL  Final  Blood  Culture ID Panel (Reflexed)     Status: Abnormal   Collection Time: 11/10/23  8:34 PM  Result Value Ref Range Status   Enterococcus faecalis NOT DETECTED NOT DETECTED Final   Enterococcus Faecium NOT DETECTED NOT DETECTED Final   Listeria monocytogenes NOT DETECTED NOT DETECTED Final   Staphylococcus species DETECTED (A) NOT DETECTED Final  Comment: CRITICAL RESULT CALLED TO, READ BACK BY AND VERIFIED WITH: K GRAY,RN@0110  11/12/23 MK    Staphylococcus aureus (BCID) NOT DETECTED NOT DETECTED Final   Staphylococcus epidermidis DETECTED (A) NOT DETECTED Final    Comment: Methicillin (oxacillin) resistant coagulase negative staphylococcus. Possible blood culture contaminant (unless isolated from more than one blood culture draw or clinical case suggests pathogenicity). No antibiotic treatment is indicated for blood  culture contaminants. CRITICAL RESULT CALLED TO, READ BACK BY AND VERIFIED WITH: K GRAY,RN@0110  11/12/23 MK    Staphylococcus lugdunensis NOT DETECTED NOT DETECTED Final   Streptococcus species NOT DETECTED NOT DETECTED Final   Streptococcus agalactiae NOT DETECTED NOT DETECTED Final   Streptococcus pneumoniae NOT DETECTED NOT DETECTED Final   Streptococcus pyogenes NOT DETECTED NOT DETECTED Final   A.calcoaceticus-baumannii NOT DETECTED NOT DETECTED Final   Bacteroides fragilis NOT DETECTED NOT DETECTED Final   Enterobacterales NOT DETECTED NOT DETECTED Final   Enterobacter cloacae complex NOT DETECTED NOT DETECTED Final   Escherichia coli NOT DETECTED NOT DETECTED Final   Klebsiella aerogenes NOT DETECTED NOT DETECTED Final   Klebsiella oxytoca NOT DETECTED NOT DETECTED Final   Klebsiella pneumoniae NOT DETECTED NOT DETECTED Final   Proteus species NOT DETECTED NOT DETECTED Final   Salmonella species NOT DETECTED NOT DETECTED Final   Serratia marcescens NOT DETECTED NOT DETECTED Final   Haemophilus influenzae NOT DETECTED NOT DETECTED Final   Neisseria meningitidis NOT  DETECTED NOT DETECTED Final   Pseudomonas aeruginosa NOT DETECTED NOT DETECTED Final   Stenotrophomonas maltophilia NOT DETECTED NOT DETECTED Final   Candida albicans NOT DETECTED NOT DETECTED Final   Candida auris NOT DETECTED NOT DETECTED Final   Candida glabrata NOT DETECTED NOT DETECTED Final   Candida krusei NOT DETECTED NOT DETECTED Final   Candida parapsilosis NOT DETECTED NOT DETECTED Final   Candida tropicalis NOT DETECTED NOT DETECTED Final   Cryptococcus neoformans/gattii NOT DETECTED NOT DETECTED Final   Methicillin resistance mecA/C DETECTED (A) NOT DETECTED Final    Comment: CRITICAL RESULT CALLED TO, READ BACK BY AND VERIFIED WITH: K GRAY,RN@0110  11/12/23 MK Performed at [facility], [address], [place], [place] [address]   Urine Culture     Status: Abnormal (Preliminary result)   Collection Time: 11/18/23 12:56 PM   Specimen: Urine, Random  Result Value Ref Range Status   Specimen Description   Final    URINE, RANDOM Performed at [facility], [address], [place], [place] [address]    Special Requests   Final    NONE Reflexed from [id number] Performed at [facility], [address] [place], [place] [address]    Culture (A)  Final    >=100,000 COLONIES/mL PSEUDOMONAS AERUGINOSA SUSCEPTIBILITIES TO FOLLOW Performed at [facility], [address], [place], [place] [address]    Report Status PENDING  Incomplete  Blood Culture (routine x 2)     Status: None (Preliminary result)   Collection Time: 11/18/23  1:16 PM   Specimen: BLOOD RIGHT WRIST  Result Value Ref Range Status   Specimen Description   Final    BLOOD RIGHT WRIST Performed at [facility], [address], [place], [place] [address]    Special Requests   Final    BOTTLES DRAWN AEROBIC AND ANAEROBIC Blood Culture results may not be optimal due to an excessive volume of blood received in culture bottles Performed at [facility], [address] [place], [place]  [address]    Culture   Final    NO GROWTH 2 DAYS Performed at [facility], [address], [place], [place] [address]    Report Status PENDING  Incomplete  Blood Culture (routine x 2)     Status: None (Preliminary result)   Collection Time: 11/18/23  1:18 PM   Specimen: BLOOD  Result Value Ref Range Status   Specimen Description   Final    BLOOD RIGHT ANTECUBITAL Performed at [facility], [address], [place], [place] [address]    Special Requests   Final    BOTTLES DRAWN AEROBIC AND ANAEROBIC Blood Culture results may not be optimal due to an excessive volume of blood received in culture bottles Performed at [facility], [address] [place], [place] [address]    Culture   Final    NO GROWTH 2 DAYS Performed at [facility], [address], [place], [place] [address]    Report Status PENDING  Incomplete  Resp panel by RT-PCR (RSV, Flu A&B, Covid) Anterior Nasal Swab     Status: None   Collection Time: 11/18/23  3:27 PM   Specimen: Anterior Nasal Swab  Result Value Ref Range Status   SARS Coronavirus 2 by RT PCR NEGATIVE NEGATIVE Final    Comment: (NOTE) SARS-CoV-2 target nucleic acids are NOT DETECTED.  The SARS-CoV-2 RNA is generally detectable in upper respiratory specimens during the acute phase of infection. The lowest concentration of SARS-CoV-2 viral copies this assay can detect is 138 copies/mL. A negative result does not preclude SARS-Cov-2 infection and should not be used as the sole basis for treatment or other patient management decisions. A negative result may occur with  improper specimen collection/handling, submission of specimen other than nasopharyngeal swab, presence of viral mutation(s) within the areas targeted by this assay, and inadequate number of viral copies(<138 copies/mL). A negative result must be combined with clinical observations, patient  history, and epidemiological information. The expected result is Negative.  Fact Sheet for Patients:  BloggerCourse.com  Fact Sheet for Healthcare Providers:  SeriousBroker.it  This test is no t yet approved or cleared by the [place] FDA and  has been authorized for detection and/or diagnosis of SARS-CoV-2 by FDA under an Emergency Use Authorization (EUA). This EUA will remain  in effect (meaning this test can be used) for the duration of the COVID-19 declaration under Section 564(b)(1) of the Act, 21 U.S.C.section 360bbb-3(b)(1), unless the authorization is terminated  or revoked sooner.       Influenza A by PCR NEGATIVE NEGATIVE Final   Influenza B by PCR NEGATIVE NEGATIVE Final    Comment: (NOTE) The Xpert Xpress SARS-CoV-2/FLU/RSV plus assay is intended as an aid in the diagnosis of influenza from Nasopharyngeal swab specimens and should not be used as a sole basis for treatment. Nasal washings and aspirates are unacceptable for Xpert Xpress SARS-CoV-2/FLU/RSV testing.  Fact Sheet for Patients: BloggerCourse.com  Fact Sheet for Healthcare Providers: SeriousBroker.it  This test is not yet approved or cleared by the [place] FDA and has been authorized for detection and/or diagnosis of SARS-CoV-2 by FDA under an Emergency Use Authorization (EUA). This EUA will remain in effect (meaning this test can be used) for the duration of the COVID-19 declaration under Section 564(b)(1) of the Act, 21 U.S.C. section 360bbb-3(b)(1), unless the authorization is terminated or revoked.     Resp Syncytial Virus by PCR NEGATIVE NEGATIVE Final    Comment: (  NOTE) Fact Sheet for Patients: BloggerCourse.com  Fact Sheet for Healthcare Providers: SeriousBroker.it  This test is not yet approved or cleared by the [place] FDA  and has been authorized for detection and/or diagnosis of SARS-CoV-2 by FDA under an Emergency Use Authorization (EUA). This EUA will remain in effect (meaning this test can be used) for the duration of the COVID-19 declaration under Section 564(b)(1) of the Act, 21 U.S.C. section 360bbb-3(b)(1), unless the authorization is terminated or revoked.  Performed at [facility], [address], [place], [place] [address]   MRSA Next Gen by PCR, Nasal     Status: None   Collection Time: 11/19/23 12:08 PM   Specimen: Nasal Mucosa; Nasal Swab  Result Value Ref Range Status   MRSA by PCR Next Gen NOT DETECTED NOT DETECTED Final    Comment: (NOTE) The GeneXpert MRSA Assay (FDA approved for NASAL specimens only), is one component of a comprehensive MRSA colonization surveillance program. It is not intended to diagnose MRSA infection nor to guide or monitor treatment for MRSA infections. Test performance is not FDA approved in patients less than 50 years old. Performed at [facility], [address] [place], [place] [address]          Radiology Studies: CT Head Wo Contrast  Result Date: 11/18/2023 CLINICAL DATA:  Mental status change, unknown cause EXAM: CT HEAD WITHOUT CONTRAST TECHNIQUE: Contiguous axial images were obtained from the base of the skull through the vertex without intravenous contrast. RADIATION DOSE REDUCTION: This exam was performed according to the departmental dose-optimization program which includes automated exposure control, adjustment of the mA and/or kV according to patient size and/or use of iterative reconstruction technique. COMPARISON:  11/10/2023 FINDINGS: Brain: No evidence of acute infarction, hemorrhage, hydrocephalus, extra-axial collection or mass lesion/mass effect. Patchy low-density changes within the periventricular and subcortical white matter most compatible with chronic microvascular ischemic change. Moderate  diffuse cerebral volume loss. Vascular: Atherosclerotic calcifications involving the large vessels of the skull base. No unexpected hyperdense vessel. Skull: Negative for calvarial fracture. Similar thinning of the parietal calvarium bilaterally. Sinuses/Orbits: No acute finding. Other: None. IMPRESSION: 1. No acute intracranial findings. 2. Chronic microvascular ischemic change and cerebral volume loss. Electronically Signed   By: Duanne Guess D.O.   On: 11/18/2023 14:21   DG Chest Port 1 View  Result Date: 11/18/2023 CLINICAL DATA:  Sepsis EXAM: PORTABLE CHEST 1 VIEW COMPARISON:  X-ray 11/10/2023. older exams FINDINGS: Sternal wires. Normal cardiopericardial silhouette. Calcified aorta. No consolidation, pneumothorax or effusion. No edema. Status post TAVR. Overlapping cardiac leads. Degenerative changes of the spine and shoulders. IMPRESSION: Postop chest. Status post TAVR. Underinflation. No acute cardiopulmonary disease Electronically Signed   By: Karen Kays M.D.   On: 11/18/2023 13:31     Scheduled Meds:  enoxaparin (LOVENOX) injection  30 mg Subcutaneous Q24H   insulin aspart  0-6 Units Subcutaneous TID WC   Continuous Infusions:  cefTRIAXone (ROCEPHIN)  IV 2 g (11/19/23 1358)     LOS: 2 days    Time spent: 39 min  Alwyn Ren, MD   11/20/2023, 10:45 AM

## 2023-11-20 NOTE — Plan of Care (Signed)
  Problem: Pain Management: Goal: General experience of comfort will improve Outcome: Progressing   Problem: Safety: Goal: Ability to remain free from injury will improve Outcome: Progressing   Problem: Skin Integrity: Goal: Risk for impaired skin integrity will decrease Outcome: Progressing

## 2023-11-20 NOTE — Progress Notes (Signed)
   11/20/23 1622  Assess: MEWS Score  Temp 99 F (37.2 C)  BP (!) 98/48  MAP (mmHg) (!) 61  Pulse Rate (!) 114  Resp (!) 24  SpO2 99 %  O2 Device Nasal Cannula  Assess: MEWS Score  MEWS Temp 0  MEWS Systolic 1  MEWS Pulse 2  MEWS RR 1  MEWS LOC 1  MEWS Score 5  MEWS Score Color Red  Assess: if the MEWS score is Yellow or Red  Were vital signs accurate and taken at a resting state? Yes  Does the patient meet 2 or more of the SIRS criteria? Yes  Does the patient have a confirmed or suspected source of infection? Yes  MEWS guidelines implemented  Yes, red  Treat  MEWS Interventions Considered administering scheduled or prn medications/treatments as ordered  Take Vital Signs  Increase Vital Sign Frequency  Red: Q1hr x2, continue Q4hrs until patient remains green for 12hrs  Escalate  MEWS: Escalate Red: Discuss with charge nurse and notify provider. Consider notifying RRT. If remains red for 2 hours consider need for higher level of care  Notify: Charge Nurse/RN  Name of Charge Nurse/RN Notified Administrator, Civil Service  Provider Notification  Provider Name/Title Theadora Rama  Date Provider Notified 11/20/23  Time Provider Notified 1625  Method of Notification Page  Notification Reason Change in status  Provider response See new orders  Date of Provider Response 11/20/23  Time of Provider Response 1430  Assess: SIRS CRITERIA  SIRS Temperature  0  SIRS Pulse 1  SIRS Respirations  1  SIRS WBC 0  SIRS Score Sum  2

## 2023-11-21 ENCOUNTER — Other Ambulatory Visit: Payer: Self-pay

## 2023-11-21 ENCOUNTER — Encounter (HOSPITAL_COMMUNITY): Payer: Self-pay | Admitting: Internal Medicine

## 2023-11-21 DIAGNOSIS — N179 Acute kidney failure, unspecified: Secondary | ICD-10-CM | POA: Diagnosis not present

## 2023-11-21 LAB — URINE CULTURE: Culture: >=100000 — AB

## 2023-11-21 LAB — COMPREHENSIVE METABOLIC PANEL
ALT: 14 U/L (ref 0–44)
AST: 27 U/L (ref 15–41)
Albumin: 2.3 g/dL — ABNORMAL LOW (ref 3.5–5.0)
Alkaline Phosphatase: 58 U/L (ref 38–126)
Anion gap: 8 (ref 5–15)
BUN: 30 mg/dL — ABNORMAL HIGH (ref 8–23)
CO2: 24 mmol/L (ref 22–32)
Calcium: 11.7 mg/dL — ABNORMAL HIGH (ref 8.9–10.3)
Chloride: 113 mmol/L — ABNORMAL HIGH (ref 98–111)
Creatinine, Ser: 1.53 mg/dL — ABNORMAL HIGH (ref 0.61–1.24)
GFR, Estimated: 44 mL/min — ABNORMAL LOW (ref >60)
Glucose, Bld: 142 mg/dL — ABNORMAL HIGH (ref 70–99)
Potassium: 3.9 mmol/L (ref 3.5–5.1)
Sodium: 145 mmol/L (ref 135–145)
Total Bilirubin: 0.4 mg/dL (ref <1.2)
Total Protein: 6.6 g/dL (ref 6.5–8.1)

## 2023-11-21 LAB — GLUCOSE, CAPILLARY
Glucose-Capillary: 120 mg/dL — ABNORMAL HIGH (ref 70–99)
Glucose-Capillary: 125 mg/dL — ABNORMAL HIGH (ref 70–99)
Glucose-Capillary: 148 mg/dL — ABNORMAL HIGH (ref 70–99)
Glucose-Capillary: 171 mg/dL — ABNORMAL HIGH (ref 70–99)
Glucose-Capillary: 145 mg/dL — ABNORMAL HIGH (ref 70–99)

## 2023-11-21 LAB — CBC
HCT: 35.1 % — ABNORMAL LOW (ref 39.0–52.0)
Hemoglobin: 10.2 g/dL — ABNORMAL LOW (ref 13.0–17.0)
MCH: 29.6 pg (ref 26.0–34.0)
MCHC: 29.1 g/dL — ABNORMAL LOW (ref 30.0–36.0)
MCV: 101.7 fL — ABNORMAL HIGH (ref 80.0–100.0)
Platelets: 291 10*3/uL (ref 150–400)
RBC: 3.45 MIL/uL — ABNORMAL LOW (ref 4.22–5.81)
RDW: 15.2 % (ref 11.5–15.5)
WBC: 11.9 10*3/uL — ABNORMAL HIGH (ref 4.0–10.5)
nRBC: 0 % (ref 0.0–0.2)

## 2023-11-21 MED ORDER — DEXTROSE 5 % IV SOLN: INTRAVENOUS | Status: AC

## 2023-11-21 MED ORDER — HYDROMORPHONE HCL 1 MG/ML IJ SOLN
0.5000 mg | INTRAMUSCULAR | Status: DC
  Administered 2023-11-21 – 2023-11-23 (×10): 0.5 mg via INTRAVENOUS
  Filled 2023-11-21 (×10): qty 0.5

## 2023-11-21 MED ORDER — HYDROMORPHONE HCL 1 MG/ML PO LIQD: 1.0000 mg | ORAL | Status: DC

## 2023-11-21 NOTE — Progress Notes (Signed)
PROGRESS NOTE    [name]  OZD:[id number] DOB: February 25, 1934 DOA: 11/18/2023 PCP: [name], MD  Brief Narrative: 89 y.o. male with medical history significant of stage III CKD stage IIIa, renal cell carcinoma status post nephrectomy, bladder cancer, prostate cancer status postradiation, hypertension hyperlipidemia recent TAVR, CABG, type 2 diabetes, systolic heart failure, chronic back pain recent back surgery, gout, GERD, he had back surgery in October 2024 complicated by postsurgical wound infection cellulitis and UTI.  He was discharged to [facility] in October and has been living at [facility] now under long-term care.  He was sent to the hospital due to change in mental status decreased p.o. intake no appetite for the last 5 to 6 weeks.  Family denies nausea vomiting diarrhea.  No fever chills or cough reported.  He does not have a Foley catheter.  He does wear diaper and is dry when I saw him in the ER.  In the ER he was found to have a very elevated calcium of 13, hypernatremia sodium 147, UA consistent with dehydration and UTI with elevated white count of 12 tachypnea tachycardia and cytosis.  He is being admitted with severe dehydration hypercalcemia, hyponatremia and sepsis likely from urinary tract infection.  History was obtained from the patient's records and patient's wife who has memory issues lives in memory care and his niece was in the ER when I saw the patient.  CODE STATUS was discussed with patient with niece and agreed that DNR would be appropriate for him in this situation.  Patient appears very masticated with scaphoid belly and unable to follow commands appears extremely dry. He received Ringer lactate bolus and Rocephin in the ER.  Assessment & Plan:   Principal Problem:   AKI (acute kidney injury) (HCC) Active Problems:   Sepsis (HCC)   Hypercalcemia   Hypernatremia   Dehydration   #1 hypercalcemia of dehydration in the setting of multiple malignancies with poor  p.o. intake for 5 to 6 weeks.  Will continue IV fluid hydration follow-up labs in the morning Ca 11.7 from 12.6 from 13.5  S/p Pamidronate 11/29   #2 hypernatremia secondary to dehydration change IV fluids to D5 w follow-up labs in AM.   #3  Sepsis secondary to Pseudomonas UTI.  Patient met criteria for sepsis with leukocytosis tachypnea and tachycardia and fever.  He was started on cefepime 11/20/2023 and DC Rocephin.  #4 CODE STATUS DNR discussed with patient's wife and niece at the same time.  Will consult palliative care..  Patient will be discharged to a long-term care facility [facility] on hospice.   #5 AKI-single kidney status post nephrectomy for renal cell carcinoma CKD stage III AA continue fluid hydration avoid nephrotoxins follow-up labs Creatinine 1.53 which is down from 1.83 on admission.  His bladder scan showed over 250 cc of urine which he was able to urinate without placing a Foley catheter.    #6 history of hypertension with hypotension on midodrine prior to admission   #7 history of systolic heart failure with TAVR will monitor closely on IV fluids  #8 dehisced postop lumbar wound/surgical wound he had surgery on September 06, 2023 with purulent drainage wound care notes reviewed appreciate input. Clean back wound with Vashe [place] # [phone]), pat dry. apply Silver Hydrofiber (Aquacel [employer] [place] [phone]) cut to fit wound bed daily,  use a Q tip applicator to insert into wound bed leaving a tail of silver hanging out of wound for easy removal.   Cover with  dry gauze and silicone foam. May lift foam daily to replace silver.  Change foam q3 days and prn soiling.  Moisten silver with NS if stuck to wound bed for atraumatic removal.  #9 stage II coccyx pressure injury present on admission-continue care with foam dressing  Pressure Injury 11/18/23 Coccyx Medial Stage 2 -  Partial thickness loss of dermis presenting as a shallow open injury with a red, pink wound bed without  slough. Pink wound with avulsion (Active)  11/18/23 1929  Location: Coccyx  Location Orientation: Medial  Staging: Stage 2 -  Partial thickness loss of dermis presenting as a shallow open injury with a red, pink wound bed without slough.  Wound Description (Comments): Pink wound with avulsion  Present on Admission: Yes  Dressing Type Foam - Lift dressing to assess site every shift 11/19/23 0732    Estimated body mass index is 26.33 kg/m as calculated from the following:   Height as of 10/29/23: 5\' 6"  (1.676 m).   Weight as of 10/28/23: 74 kg.  DVT prophylaxis: lovenox Code Status:dnr Family Communication: Disposition Plan:  Status is: Inpatient Remains inpatient appropriate because: aki    Consultants:  NONE  Procedures: none Antimicrobials: rocephin  Subjective: Seen in bed awake follows some simple commands today very weak able to squeeze my hands slowly when asked to do so Objective: Vitals:   11/20/23 2100 11/21/23 0208 11/21/23 0551 11/21/23 0842  BP: 116/61 99/74 111/77 104/68  Pulse: (!) 113 (!) 117 (!) 54   Resp: 20 (!) 24 20 20   Temp: 98.8 F (37.1 C) 99.5 F (37.5 C) 97.7 F (36.5 C) 98.2 F (36.8 C)  TempSrc: Oral Oral Oral Oral  SpO2: 96% 99% 100% 100%    Intake/Output Summary (Last 24 hours) at 11/21/2023 4132 Last data filed at 11/21/2023 0843 Gross per 24 hour  Intake 100 ml  Output 400 ml  Net -300 ml   There were no vitals filed for this visit.  Examination:  General exam: Appears chronically ill-appearing frail cachectic  respiratory system: Clear to auscultation. Respiratory effort normal. Cardiovascular system: S1 & S2 heard, RRR. No JVD, murmurs, rubs, gallops or clicks. No pedal edema. Gastrointestinal system: Abdomen is nondistended, soft and nontender. No organomegaly or masses felt. Normal bowel sounds heard. Central nervous system: Awake but does not follow any commands or answer questions does move all the extremities Extremities: No  edema  SKIN-  incision on the lumbar area is covered with a dressing. Data Reviewed: I have personally reviewed following labs and imaging studies  CBC: Recent Labs  Lab 11/18/23 1318 11/19/23 0419 11/20/23 1103 11/21/23 0353  WBC 12.7* 12.1* 11.8* 11.9*  NEUTROABS 8.6*  --   --   --   HGB 11.0* 9.6* 9.4* 10.2*  HCT 34.7* 33.0* 31.9* 35.1*  MCV 95.6 100.6* 100.0 101.7*  PLT 446* 345 321 291   Basic Metabolic Panel: Recent Labs  Lab 11/18/23 1318 11/19/23 0419 11/20/23 1103 11/21/23 0353  NA 147* 148* 143 145  K 3.7 3.8 3.5 3.9  CL 112* 116* 109 113*  CO2 25 24 26 24   GLUCOSE 185* 158* 186* 142*  BUN 56* 43* 31* 30*  CREATININE 1.83* 1.58* 1.44* 1.53*  CALCIUM 13.5* 12.6* 11.7* 11.7*   GFR: CrCl cannot be calculated (Unknown ideal weight.). Liver Function Tests: Recent Labs  Lab 11/18/23 1318 11/19/23 0419 11/20/23 1103 11/21/23 0353  AST 23 20 29 27   ALT 13 13 13 14   ALKPHOS 62 54 53  58  BILITOT 0.4 0.7 0.4 0.4  PROT 7.5 6.4* 6.2* 6.6  ALBUMIN 2.8* 2.5* 2.2* 2.3*   No results for input(s): "LIPASE", "AMYLASE" in the last 168 hours. No results for input(s): "AMMONIA" in the last 168 hours. Coagulation Profile: Recent Labs  Lab 11/18/23 1318  INR 1.3*   Cardiac Enzymes: Recent Labs  Lab 11/18/23 1318  CKTOTAL 157   BNP (last 3 results) No results for input(s): "PROBNP" in the last 8760 hours. HbA1C: No results for input(s): "HGBA1C" in the last 72 hours. CBG: Recent Labs  Lab 11/20/23 1134 11/20/23 1657 11/20/23 2102 11/21/23 0205 11/21/23 0801  GLUCAP 165* 146* 128* 120* 125*   Lipid Profile: No results for input(s): "CHOL", "HDL", "LDLCALC", "TRIG", "CHOLHDL", "LDLDIRECT" in the last 72 hours. Thyroid Function Tests: No results for input(s): "TSH", "T4TOTAL", "FREET4", "T3FREE", "THYROIDAB" in the last 72 hours. Anemia Panel: No results for input(s): "VITAMINB12", "FOLATE", "FERRITIN", "TIBC", "IRON", "RETICCTPCT" in the last 72  hours. Sepsis Labs: Recent Labs  Lab 11/18/23 1318 11/18/23 1334  PROCALCITON 0.26  --   LATICACIDVEN  --  1.3    Recent Results (from the past 240 hour(s))  Urine Culture     Status: Abnormal   Collection Time: 11/18/23 12:56 PM   Specimen: Urine, Random  Result Value Ref Range Status   Specimen Description   Final    URINE, RANDOM Performed at [facility], [address], [place], [place] [address]    Special Requests   Final    NONE Reflexed from [id number] Performed at [facility], [address], [place], [place] [address]    Culture >=100,000 COLONIES/mL PSEUDOMONAS AERUGINOSA (A)  Final   Report Status 11/21/2023 FINAL  Final   Organism ID, Bacteria PSEUDOMONAS AERUGINOSA (A)  Final      Susceptibility   Pseudomonas aeruginosa - MIC*    CEFTAZIDIME 4 SENSITIVE Sensitive     CIPROFLOXACIN <=0.25 SENSITIVE Sensitive     GENTAMICIN <=1 SENSITIVE Sensitive     IMIPENEM 2 SENSITIVE Sensitive     PIP/TAZO 16 SENSITIVE Sensitive ug/mL    CEFEPIME 2 SENSITIVE Sensitive     * >=100,000 COLONIES/mL PSEUDOMONAS AERUGINOSA  Blood Culture (routine x 2)     Status: None (Preliminary result)   Collection Time: 11/18/23  1:16 PM   Specimen: BLOOD RIGHT WRIST  Result Value Ref Range Status   Specimen Description   Final    BLOOD RIGHT WRIST Performed at [facility], [address], [place], [place] [address]    Special Requests   Final    BOTTLES DRAWN AEROBIC AND ANAEROBIC Blood Culture results may not be optimal due to an excessive volume of blood received in culture bottles Performed at [facility], [address], [place], [place] [address]    Culture   Final    NO GROWTH 3 DAYS Performed at [facility], [address], [place], [place] [address]    Report Status PENDING  Incomplete  Blood Culture (routine x 2)     Status: None (Preliminary result)   Collection Time: 11/18/23  1:18 PM   Specimen:  BLOOD  Result Value Ref Range Status   Specimen Description   Final    BLOOD RIGHT ANTECUBITAL Performed at [facility], [address], [place], [place] [address]    Special Requests   Final    BOTTLES DRAWN AEROBIC AND ANAEROBIC Blood Culture results may not  be optimal due to an excessive volume of blood received in culture bottles Performed at [facility], [address], [place], [place] [address]    Culture   Final    NO GROWTH 3 DAYS Performed at [facility], [address] [place], [place] [address]    Report Status PENDING  Incomplete  Resp panel by RT-PCR (RSV, Flu A&B, Covid) Anterior Nasal Swab     Status: None   Collection Time: 11/18/23  3:27 PM   Specimen: Anterior Nasal Swab  Result Value Ref Range Status   SARS Coronavirus 2 by RT PCR NEGATIVE NEGATIVE Final    Comment: (NOTE) SARS-CoV-2 target nucleic acids are NOT DETECTED.  The SARS-CoV-2 RNA is generally detectable in upper respiratory specimens during the acute phase of infection. The lowest concentration of SARS-CoV-2 viral copies this assay can detect is 138 copies/mL. A negative result does not preclude SARS-Cov-2 infection and should not be used as the sole basis for treatment or other patient management decisions. A negative result may occur with  improper specimen collection/handling, submission of specimen other than nasopharyngeal swab, presence of viral mutation(s) within the areas targeted by this assay, and inadequate number of viral copies(<138 copies/mL). A negative result must be combined with clinical observations, patient history, and epidemiological information. The expected result is Negative.  Fact Sheet for Patients:  BloggerCourse.com  Fact Sheet for Healthcare Providers:  SeriousBroker.it  This test is no t yet approved or cleared by the [place] FDA and  has been authorized for  detection and/or diagnosis of SARS-CoV-2 by FDA under an Emergency Use Authorization (EUA). This EUA will remain  in effect (meaning this test can be used) for the duration of the COVID-19 declaration under Section 564(b)(1) of the Act, 21 U.S.C.section 360bbb-3(b)(1), unless the authorization is terminated  or revoked sooner.       Influenza A by PCR NEGATIVE NEGATIVE Final   Influenza B by PCR NEGATIVE NEGATIVE Final    Comment: (NOTE) The Xpert Xpress SARS-CoV-2/FLU/RSV plus assay is intended as an aid in the diagnosis of influenza from Nasopharyngeal swab specimens and should not be used as a sole basis for treatment. Nasal washings and aspirates are unacceptable for Xpert Xpress SARS-CoV-2/FLU/RSV testing.  Fact Sheet for Patients: BloggerCourse.com  Fact Sheet for Healthcare Providers: SeriousBroker.it  This test is not yet approved or cleared by the [place] FDA and has been authorized for detection and/or diagnosis of SARS-CoV-2 by FDA under an Emergency Use Authorization (EUA). This EUA will remain in effect (meaning this test can be used) for the duration of the COVID-19 declaration under Section 564(b)(1) of the Act, 21 U.S.C. section 360bbb-3(b)(1), unless the authorization is terminated or revoked.     Resp Syncytial Virus by PCR NEGATIVE NEGATIVE Final    Comment: (NOTE) Fact Sheet for Patients: BloggerCourse.com  Fact Sheet for Healthcare Providers: SeriousBroker.it  This test is not yet approved or cleared by the [place] FDA and has been authorized for detection and/or diagnosis of SARS-CoV-2 by FDA under an Emergency Use Authorization (EUA). This EUA will remain in effect (meaning this test can be used) for the duration of the COVID-19 declaration under Section 564(b)(1) of the Act, 21 U.S.C. section 360bbb-3(b)(1), unless the authorization is  terminated or revoked.  Performed at [facility], [address], [place], [place] [address]   MRSA Next Gen by PCR, Nasal     Status: None   Collection Time: 11/19/23  12:08 PM   Specimen: Nasal Mucosa; Nasal Swab  Result Value Ref Range Status   MRSA by PCR Next Gen NOT DETECTED NOT DETECTED Final    Comment: (NOTE) The GeneXpert MRSA Assay (FDA approved for NASAL specimens only), is one component of a comprehensive MRSA colonization surveillance program. It is not intended to diagnose MRSA infection nor to guide or monitor treatment for MRSA infections. Test performance is not FDA approved in patients less than 26 years old. Performed at [facility], [address] [place], [place] [address]          Radiology Studies: DG CHEST PORT 1 VIEW  Result Date: 11/20/2023 CLINICAL DATA:  Altered level of consciousness, cough, decreased appetite EXAM: PORTABLE CHEST 1 VIEW COMPARISON:  11/18/2023 FINDINGS: Single frontal view of the chest demonstrates postsurgical changes from CABG and aortic valve replacement. Cardiac silhouette is stable, with continued atherosclerosis of the thoracic aorta noted. No acute airspace disease, effusion, or pneumothorax. No acute bony abnormality. IMPRESSION: 1. Stable chest, no acute process. Electronically Signed   By: Sharlet Salina M.D.   On: 11/20/2023 16:26     Scheduled Meds:  enoxaparin (LOVENOX) injection  30 mg Subcutaneous Q24H   insulin aspart  0-6 Units Subcutaneous TID WC   Continuous Infusions:  ceFEPime (MAXIPIME) IV 2 g (11/20/23 2106)     LOS: 3 days    Time spent: 39 min  Alwyn Ren, MD   11/21/2023, 9:27 AM

## 2023-11-21 NOTE — Progress Notes (Signed)
PMT no charge note. Chart reviewed, discussed with TRH MD at the time of initial consult on 11-20-2023 Continue current hospitalization Will request TOC to reach out to [facility] care in a.m. on 11-22-2023 to have the patient enrolled in hospice services at his [place] long-term facility upon discharge.  Otherwise, no new inpatient palliative specific recommendations at this time. No charge Rosalin Hawking MD [place] palliative.

## 2023-11-21 NOTE — Plan of Care (Signed)
  Problem: Pain Management: Goal: General experience of comfort will improve Outcome: Progressing   Problem: Safety: Goal: Ability to remain free from injury will improve Outcome: Progressing   Problem: Skin Integrity: Goal: Risk for impaired skin integrity will decrease Outcome: Progressing

## 2023-11-22 DIAGNOSIS — N179 Acute kidney failure, unspecified: Secondary | ICD-10-CM | POA: Diagnosis not present

## 2023-11-22 LAB — GLUCOSE, CAPILLARY
Glucose-Capillary: 161 mg/dL — ABNORMAL HIGH (ref 70–99)
Glucose-Capillary: 179 mg/dL — ABNORMAL HIGH (ref 70–99)
Glucose-Capillary: 162 mg/dL — ABNORMAL HIGH (ref 70–99)
Glucose-Capillary: 163 mg/dL — ABNORMAL HIGH (ref 70–99)

## 2023-11-22 LAB — COMPREHENSIVE METABOLIC PANEL
ALT: 13 U/L (ref 0–44)
AST: 24 U/L (ref 15–41)
Albumin: 2.1 g/dL — ABNORMAL LOW (ref 3.5–5.0)
Alkaline Phosphatase: 53 U/L (ref 38–126)
Anion gap: 7 (ref 5–15)
BUN: 32 mg/dL — ABNORMAL HIGH (ref 8–23)
CO2: 23 mmol/L (ref 22–32)
Calcium: 11 mg/dL — ABNORMAL HIGH (ref 8.9–10.3)
Chloride: 111 mmol/L (ref 98–111)
Creatinine, Ser: 1.53 mg/dL — ABNORMAL HIGH (ref 0.61–1.24)
GFR, Estimated: 44 mL/min — ABNORMAL LOW (ref >60)
Glucose, Bld: 183 mg/dL — ABNORMAL HIGH (ref 70–99)
Potassium: 3.7 mmol/L (ref 3.5–5.1)
Sodium: 141 mmol/L (ref 135–145)
Total Bilirubin: 0.3 mg/dL (ref <1.2)
Total Protein: 6 g/dL — ABNORMAL LOW (ref 6.5–8.1)

## 2023-11-22 NOTE — Progress Notes (Signed)
Daily Progress Note   Patient Name: [name]       Date: 11/22/2023 DOB: 1934-07-29  Age: 89 y.o. MRN#: [id number] Attending Physician: Alwyn Ren, MD Primary Care Physician: [name], MD Admit Date: 11/18/2023  Reason for Consultation/Follow-up: Establishing goals of care  Subjective: Resting in bed, appears with ongoing generalized weakness.  Awakens but is not alert.  Length of Stay: 4  Current Medications: Scheduled Meds:   enoxaparin (LOVENOX) injection  30 mg Subcutaneous Q24H    HYDROmorphone (DILAUDID) injection  0.5 mg Intravenous Q4H   insulin aspart  0-6 Units Subcutaneous TID WC    Continuous Infusions:  ceFEPime (MAXIPIME) IV 2 g (11/22/23 1017)   dextrose 75 mL/hr at 11/22/23 0013    PRN Meds: acetaminophen **OR** acetaminophen, HYDROmorphone (DILAUDID) injection, metoprolol tartrate  Physical Exam         Appears chronically ill Appears with generalized weakness Awakens but does not follow commands Shallow regular work of breathing  Vital Signs: BP (!) 93/51 (BP Location: Left Arm)   Pulse 77   Temp 98.5 F (36.9 C) (Oral)   Resp 16   Ht 5\' 6"  (1.676 m)   Wt 61.6 kg   SpO2 100%   BMI 21.92 kg/m  SpO2: SpO2: 100 % O2 Device: O2 Device: Nasal Cannula O2 Flow Rate: O2 Flow Rate (L/min): 2 L/min  Intake/output summary:  Intake/Output Summary (Last 24 hours) at 11/22/2023 1143 Last data filed at 11/22/2023 0600 Gross per 24 hour  Intake 1647.44 ml  Output 450 ml  Net 1197.44 ml   LBM: Last BM Date : 11/19/23 Baseline Weight: Weight: 61.6 kg Most recent weight: Weight: 61.6 kg       Palliative Assessment/Data:      Patient Active Problem List   Diagnosis Date Noted   AKI (acute kidney injury) (HCC) 11/18/2023    Hypercalcemia 11/18/2023   Hypernatremia 11/18/2023   Dehydration 11/18/2023   AMS (altered mental status) 10/30/2023   Hematuria 10/29/2023   Acute renal failure superimposed on stage 3a chronic kidney disease (HCC) 10/28/2023   Acute metabolic encephalopathy 10/28/2023   Near syncope 10/28/2023   Physical deconditioning 10/28/2023   Transient hypotension 09/24/2023   Wound cellulitis after surgery 09/24/2023   S/P CABG (coronary artery bypass graft) 07/12/2023   Myalgia due to  statin 07/12/2023   Chronic pain syndrome 09/08/2022   Lumbar radiculopathy 08/06/2022   History of renal cell cancer    Diabetes mellitus (HCC)    Prostate cancer (HCC)    Coronary artery disease    Chronic combined systolic and diastolic heart failure (HCC)    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   Discitis 12/22/2015   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 male with medical history significant of stage III CKD stage IIIa, renal cell carcinoma status post nephrectomy, bladder cancer, prostate cancer status postradiation, hypertension hyperlipidemia recent TAVR, CABG, type 2 diabetes, systolic heart failure, chronic back pain recent back surgery, gout, GERD, he had back surgery in October 2024 complicated by postsurgical wound infection cellulitis and UTI.  He was discharged to [facility] in October and has been living at [facility] now under long-term care.  He was sent to the hospital due to change in mental status decreased p.o. intake no appetite for the last 5 to 6 weeks.      Recommendations/Plan: DNR-comfort measures Continue scheduled IV opioids N.p.o. TOC consult for residential hospice evaluation, patient already under [facility] care palliative services at [facility] facility.     Code Status:    Code Status Orders  (From admission, onward)           Start     Ordered   11/22/23 1142  Do not attempt resuscitation (DNR) - Comfort care  (Code Status)  Continuous       Question Answer Comment  If patient has no pulse and is not breathing Do Not Attempt Resuscitation   In Pre-Arrest Conditions (Patient Is Breathing and Has a Pulse) Provide comfort measures. Relieve any mechanical airway obstruction. Avoid transfer unless required for comfort.   Consent: Discussion documented in EHR or advanced directives reviewed      11/22/23 1141           Code Status History     Date Active Date Inactive Code Status Order ID Comments User Context   11/18/2023 1515 11/22/2023 1141 Limited: Do not attempt resuscitation (DNR) -DNR-LIMITED -Do Not Intubate/DNI  [id number]  Alwyn Ren, MD ED   10/28/2023 2100 11/05/2023 1708 Full Code [id number]  John Giovanni, MD ED   09/24/2023 0033 09/30/2023 1938 Full Code [id number]  Anselm Jungling, DO ED   09/01/2023 1108 09/01/2023 1848 Full Code [id number]  Corky Crafts, MD Inpatient   06/22/2017 1043 06/24/2017 1659 Full Code [id number]  Tonny Bollman, MD Inpatient   11/03/2016 0852 11/03/2016 1547 Full Code [id number]  Lyn Records, MD Inpatient   12/22/2015 1446 12/24/2015 1509 Full Code [id number]  Meredith Pel, NP Inpatient   10/26/2015 0332 10/31/2015 1901 Full Code [id number]  Ron Parker, MD Inpatient       Prognosis:  < 2 weeks  Discharge Planning: Hospice facility  Care plan was discussed with patient's niece Jasmine December at [phone], also corresponded with [facility] MD and [facility] colleagues.   Thank you for allowing the Palliative Medicine Team to assist in  the care of this patient.  Mod MDM.      Greater than 50%  of this time was spent counseling and coordinating care related to the above assessment and plan.  Rosalin Hawking, MD  Please contact Palliative Medicine Team phone at [phone] for questions and concerns.

## 2023-11-22 NOTE — TOC Progression Note (Signed)
Transition of Care [facility]) - Progression Note    Patient Details  Name: [name] MRN: [id number] Date of Birth: 02/21/1934  [facility]) CM/SW Contact  Larrie Kass, LCSW Phone Number: 11/22/2023, 11:51 AM  Clinical Narrative:     CSW attmpeted to contact pt's wife to dicuss rec for residential hospice, no answer.   CSW spoke with pt's nephew Mellody Dance to confirm hospice choice. He agrees with rec and has questions for Hospice liaison. CSW sent referral to [facility]. TOC to follow.   Expected Discharge Plan: Long Term Nursing Home Barriers to Discharge: Continued Medical Work up  Expected Discharge Plan and Services In-house Referral: Clinical Social Work Discharge Planning Services: NA Post Acute Care Choice: Resumption of Svcs/PTA Provider, Nursing Home Living arrangements for the past 2 months: Skilled Nursing Facility                 DME Arranged: N/A DME Agency: NA                   Social Determinants of Health (SDOH) Interventions SDOH Screenings   Food Insecurity: Patient Unable To Answer (11/19/2023)  Housing: Patient Unable To Answer (10/28/2023)  Recent Concern: Housing - High Risk (09/24/2023)  Transportation Needs: Patient Unable To Answer (11/19/2023)  Utilities: Patient Unable To Answer (11/19/2023)  Social Connections: Unknown (05/05/2022)   Received from [facility]  Stress: No Stress Concern Present (09/06/2023)   Received from [facility]  Tobacco Use: Medium Risk (11/21/2023)    Readmission Risk Interventions    11/19/2023    1:45 PM 11/03/2023    1:08 PM  Readmission Risk Prevention Plan  Transportation Screening Complete Complete  PCP or Specialist Appt within 5-7 Days  Complete  Home Care Screening  Complete  Medication Review (RN CM)  Complete  Medication Review (RN Care Manager) Complete   PCP or Specialist appointment within 3-5 days of discharge Complete   HRI or Home Care  Consult Complete   SW Recovery Care/Counseling Consult Complete   Palliative Care Screening Complete   Skilled Nursing Facility Complete

## 2023-11-22 NOTE — Progress Notes (Signed)
PROGRESS NOTE    [name]  XBJ:[id number] DOB: 02-12-1934 DOA: 11/18/2023 PCP: [name], MD  Brief Narrative: 89 y.o. male with medical history significant of stage III CKD stage IIIa, renal cell carcinoma status post nephrectomy, bladder cancer, prostate cancer status postradiation, hypertension hyperlipidemia recent TAVR, CABG, type 2 diabetes, systolic heart failure, chronic back pain recent back surgery, gout, GERD, he had back surgery in October 2024 complicated by postsurgical wound infection cellulitis and UTI.  He was discharged to [facility] in October and has been living at [facility] now under long-term care.  He was sent to the hospital due to change in mental status decreased p.o. intake no appetite for the last 5 to 6 weeks.  Family denies nausea vomiting diarrhea.  No fever chills or cough reported.  He does not have a Foley catheter.  He does wear diaper and is dry when I saw him in the ER.  In the ER he was found to have a very elevated calcium of 13, hypernatremia sodium 147, UA consistent with dehydration and UTI with elevated white count of 12 tachypnea tachycardia and cytosis.  He is being admitted with severe dehydration hypercalcemia, hyponatremia and sepsis likely from urinary tract infection.  History was obtained from the patient's records and patient's wife who has memory issues lives in memory care and his niece was in the ER when I saw the patient.  CODE STATUS was discussed with patient with niece and agreed that DNR would be appropriate for him in this situation.  Patient appears very masticated with scaphoid belly and unable to follow commands appears extremely dry. He received Ringer lactate bolus and Rocephin in the ER.  Assessment & Plan:   Principal Problem:   AKI (acute kidney injury) (HCC) Active Problems:   Sepsis (HCC)   Hypercalcemia   Hypernatremia   Dehydration   #1 hypercalcemia of dehydration in the setting of multiple malignancies with poor  p.o. intake for 5 to 6 weeks.  Will continue IV fluid hydration follow-up labs in the morning Ca 11 from 11.7 from 12.6 from 13.5  S/p Pamidronate 11/29   #2 hypernatremia - resolved  secondary to dehydration change IV fluids to D5 w follow-up labs in AM.   #3  Sepsis secondary to Pseudomonas UTI.  Patient met criteria for sepsis with leukocytosis tachypnea and tachycardia and fever.  He was started on cefepime 11/20/2023 and DC Rocephin.  #4 CODE STATUS DNR discussed with patient's wife and niece at the same time.  Will consult palliative care..  Patient will be discharged to a long-term care facility [facility] on hospice.   #5 AKI-single kidney status post nephrectomy for renal cell carcinoma CKD stage III AA continue fluid hydration avoid nephrotoxins follow-up labs Creatinine 1.53 which is down from 1.83 on admission.  His bladder scan showed over 250 cc of urine which he was able to urinate without placing a Foley catheter.    #6 history of hypertension with hypotension on midodrine prior to admission   #7 history of systolic heart failure with TAVR will monitor closely on IV fluids  #8 dehisced postop lumbar wound/surgical wound he had surgery on September 06, 2023 with purulent drainage wound care notes reviewed appreciate input. Clean back wound with Vashe [place] # [phone]), pat dry. apply Silver Hydrofiber (Aquacel [employer] [place] [phone]) cut to fit wound bed daily,  use a Q tip applicator to insert into wound bed leaving a tail of silver hanging out of wound for easy  removal.   Cover with dry gauze and silicone foam. May lift foam daily to replace silver.  Change foam q3 days and prn soiling.  Moisten silver with NS if stuck to wound bed for atraumatic removal.  #9 stage II coccyx pressure injury present on admission-continue care with foam dressing  Pressure Injury 11/18/23 Coccyx Medial Stage 2 -  Partial thickness loss of dermis presenting as a shallow open injury with a red, pink  wound bed without slough. Pink wound with avulsion (Active)  11/18/23 1929  Location: Coccyx  Location Orientation: Medial  Staging: Stage 2 -  Partial thickness loss of dermis presenting as a shallow open injury with a red, pink wound bed without slough.  Wound Description (Comments): Pink wound with avulsion  Present on Admission: Yes  Dressing Type Foam - Lift dressing to assess site every shift 11/21/23 0930    Estimated body mass index is 21.92 kg/m as calculated from the following:   Height as of this encounter: 5\' 6"  (1.676 m).   Weight as of this encounter: 61.6 kg.  DVT prophylaxis: lovenox Code Status:dnr Family Communication: Disposition Plan:  Status is: Inpatient Remains inpatient appropriate because: aki    Consultants:  NONE  Procedures: none Antimicrobials: rocephin  Subjective: Resting in bed Npo on ivf Opens eyes when called his name  Objective: Vitals:   11/21/23 2059 11/22/23 0205 11/22/23 0614 11/22/23 0948  BP: 113/61 92/68 (!) 90/56 (!) 93/51  Pulse: (!) 103 (!) 51 (!) 102 77  Resp: 17 16 16 16   Temp: 97.6 F (36.4 C) 98 F (36.7 C) 97.6 F (36.4 C) 98.5 F (36.9 C)  TempSrc: Oral Oral Oral Oral  SpO2: 100% 100% 100% 100%  Weight:      Height:        Intake/Output Summary (Last 24 hours) at 11/22/2023 1122 Last data filed at 11/22/2023 0600 Gross per 24 hour  Intake 1647.44 ml  Output 450 ml  Net 1197.44 ml   Filed Weights   11/21/23 1154  Weight: 61.6 kg    Examination:  General exam: Appears chronically ill-appearing frail cachectic  respiratory system: Clear to auscultation. Respiratory effort normal. Cardiovascular system: S1 & S2 heard, RRR. No JVD, murmurs, rubs, gallops or clicks. No pedal edema. Gastrointestinal system: Abdomen is nondistended, soft and nontender. No organomegaly or masses felt. Normal bowel sounds heard. Central nervous system: Awake but does not follow any commands or answer questions does move all the  extremities Extremities: No edema  SKIN-  incision on the lumbar area is covered with a dressing. Data Reviewed: I have personally reviewed following labs and imaging studies  CBC: Recent Labs  Lab 11/18/23 1318 11/19/23 0419 11/20/23 1103 11/21/23 0353  WBC 12.7* 12.1* 11.8* 11.9*  NEUTROABS 8.6*  --   --   --   HGB 11.0* 9.6* 9.4* 10.2*  HCT 34.7* 33.0* 31.9* 35.1*  MCV 95.6 100.6* 100.0 101.7*  PLT 446* 345 321 291   Basic Metabolic Panel: Recent Labs  Lab 11/18/23 1318 11/19/23 0419 11/20/23 1103 11/21/23 0353 11/22/23 0352  NA 147* 148* 143 145 141  K 3.7 3.8 3.5 3.9 3.7  CL 112* 116* 109 113* 111  CO2 25 24 26 24 23   GLUCOSE 185* 158* 186* 142* 183*  BUN 56* 43* 31* 30* 32*  CREATININE 1.83* 1.58* 1.44* 1.53* 1.53*  CALCIUM 13.5* 12.6* 11.7* 11.7* 11.0*   GFR: Estimated Creatinine Clearance: 29.6 mL/min (A) (by C-G formula based on SCr of 1.53  mg/dL (H)). Liver Function Tests: Recent Labs  Lab 11/18/23 1318 11/19/23 0419 11/20/23 1103 11/21/23 0353 11/22/23 0352  AST 23 20 29 27 24   ALT 13 13 13 14 13   ALKPHOS 62 54 53 58 53  BILITOT 0.4 0.7 0.4 0.4 0.3  PROT 7.5 6.4* 6.2* 6.6 6.0*  ALBUMIN 2.8* 2.5* 2.2* 2.3* 2.1*   No results for input(s): "LIPASE", "AMYLASE" in the last 168 hours. No results for input(s): "AMMONIA" in the last 168 hours. Coagulation Profile: Recent Labs  Lab 11/18/23 1318  INR 1.3*   Cardiac Enzymes: Recent Labs  Lab 11/18/23 1318  CKTOTAL 157   BNP (last 3 results) No results for input(s): "PROBNP" in the last 8760 hours. HbA1C: No results for input(s): "HGBA1C" in the last 72 hours. CBG: Recent Labs  Lab 11/21/23 0801 11/21/23 1152 11/21/23 1557 11/21/23 2011 11/22/23 0741  GLUCAP 125* 148* 171* 145* 161*   Lipid Profile: No results for input(s): "CHOL", "HDL", "LDLCALC", "TRIG", "CHOLHDL", "LDLDIRECT" in the last 72 hours. Thyroid Function Tests: No results for input(s): "TSH", "T4TOTAL", "FREET4",  "T3FREE", "THYROIDAB" in the last 72 hours. Anemia Panel: No results for input(s): "VITAMINB12", "FOLATE", "FERRITIN", "TIBC", "IRON", "RETICCTPCT" in the last 72 hours. Sepsis Labs: Recent Labs  Lab 11/18/23 1318 11/18/23 1334  PROCALCITON 0.26  --   LATICACIDVEN  --  1.3    Recent Results (from the past 240 hour(s))  Urine Culture     Status: Abnormal   Collection Time: 11/18/23 12:56 PM   Specimen: Urine, Random  Result Value Ref Range Status   Specimen Description   Final    URINE, RANDOM Performed at [facility], [address] [place], [place] [address]    Special Requests   Final    NONE Reflexed from [id number] Performed at [facility], [address] [place], [place] [address]    Culture >=100,000 COLONIES/mL PSEUDOMONAS AERUGINOSA (A)  Final   Report Status 11/21/2023 FINAL  Final   Organism ID, Bacteria PSEUDOMONAS AERUGINOSA (A)  Final      Susceptibility   Pseudomonas aeruginosa - MIC*    CEFTAZIDIME 4 SENSITIVE Sensitive     CIPROFLOXACIN <=0.25 SENSITIVE Sensitive     GENTAMICIN <=1 SENSITIVE Sensitive     IMIPENEM 2 SENSITIVE Sensitive     PIP/TAZO 16 SENSITIVE Sensitive ug/mL    CEFEPIME 2 SENSITIVE Sensitive     * >=100,000 COLONIES/mL PSEUDOMONAS AERUGINOSA  Blood Culture (routine x 2)     Status: None (Preliminary result)   Collection Time: 11/18/23  1:16 PM   Specimen: BLOOD RIGHT WRIST  Result Value Ref Range Status   Specimen Description   Final    BLOOD RIGHT WRIST Performed at [facility], [address], [place], [place] [address]    Special Requests   Final    BOTTLES DRAWN AEROBIC AND ANAEROBIC Blood Culture results may not be optimal due to an excessive volume of blood received in culture bottles Performed at [facility], [address] [place], [place] [address]    Culture   Final    NO GROWTH 4 DAYS Performed at [facility], [address], [place], [place]  [address]    Report Status PENDING  Incomplete  Blood Culture (routine x 2)     Status: None (Preliminary result)   Collection Time: 11/18/23  1:18 PM   Specimen: BLOOD  Result Value Ref Range Status   Specimen Description  Final    BLOOD RIGHT ANTECUBITAL Performed at [facility], [address] [place], [place] [address]    Special Requests   Final    BOTTLES DRAWN AEROBIC AND ANAEROBIC Blood Culture results may not be optimal due to an excessive volume of blood received in culture bottles Performed at [facility], [address] [place], [place] [address]    Culture   Final    NO GROWTH 4 DAYS Performed at [facility], [address] [place], [place] [address]    Report Status PENDING  Incomplete  Resp panel by RT-PCR (RSV, Flu A&B, Covid) Anterior Nasal Swab     Status: None   Collection Time: 11/18/23  3:27 PM   Specimen: Anterior Nasal Swab  Result Value Ref Range Status   SARS Coronavirus 2 by RT PCR NEGATIVE NEGATIVE Final    Comment: (NOTE) SARS-CoV-2 target nucleic acids are NOT DETECTED.  The SARS-CoV-2 RNA is generally detectable in upper respiratory specimens during the acute phase of infection. The lowest concentration of SARS-CoV-2 viral copies this assay can detect is 138 copies/mL. A negative result does not preclude SARS-Cov-2 infection and should not be used as the sole basis for treatment or other patient management decisions. A negative result may occur with  improper specimen collection/handling, submission of specimen other than nasopharyngeal swab, presence of viral mutation(s) within the areas targeted by this assay, and inadequate number of viral copies(<138 copies/mL). A negative result must be combined with clinical observations, patient history, and epidemiological information. The expected result is Negative.  Fact Sheet for Patients:  BloggerCourse.com  Fact Sheet for  Healthcare Providers:  SeriousBroker.it  This test is no t yet approved or cleared by the [place] FDA and  has been authorized for detection and/or diagnosis of SARS-CoV-2 by FDA under an Emergency Use Authorization (EUA). This EUA will remain  in effect (meaning this test can be used) for the duration of the COVID-19 declaration under Section 564(b)(1) of the Act, 21 U.S.C.section 360bbb-3(b)(1), unless the authorization is terminated  or revoked sooner.       Influenza A by PCR NEGATIVE NEGATIVE Final   Influenza B by PCR NEGATIVE NEGATIVE Final    Comment: (NOTE) The Xpert Xpress SARS-CoV-2/FLU/RSV plus assay is intended as an aid in the diagnosis of influenza from Nasopharyngeal swab specimens and should not be used as a sole basis for treatment. Nasal washings and aspirates are unacceptable for Xpert Xpress SARS-CoV-2/FLU/RSV testing.  Fact Sheet for Patients: BloggerCourse.com  Fact Sheet for Healthcare Providers: SeriousBroker.it  This test is not yet approved or cleared by the [place] FDA and has been authorized for detection and/or diagnosis of SARS-CoV-2 by FDA under an Emergency Use Authorization (EUA). This EUA will remain in effect (meaning this test can be used) for the duration of the COVID-19 declaration under Section 564(b)(1) of the Act, 21 U.S.C. section 360bbb-3(b)(1), unless the authorization is terminated or revoked.     Resp Syncytial Virus by PCR NEGATIVE NEGATIVE Final    Comment: (NOTE) Fact Sheet for Patients: BloggerCourse.com  Fact Sheet for Healthcare Providers: SeriousBroker.it  This test is not yet approved or cleared by the [place] FDA and has been authorized for detection and/or diagnosis of SARS-CoV-2 by FDA under an Emergency Use Authorization (EUA). This EUA will remain in effect (meaning this  test can be used) for the duration of the COVID-19 declaration under Section 564(b)(1) of the Act, 21 U.S.C. section  360bbb-3(b)(1), unless the authorization is terminated or revoked.  Performed at [facility], [address], [place], [place] [address]   MRSA Next Gen by PCR, Nasal     Status: None   Collection Time: 11/19/23 12:08 PM   Specimen: Nasal Mucosa; Nasal Swab  Result Value Ref Range Status   MRSA by PCR Next Gen NOT DETECTED NOT DETECTED Final    Comment: (NOTE) The GeneXpert MRSA Assay (FDA approved for NASAL specimens only), is one component of a comprehensive MRSA colonization surveillance program. It is not intended to diagnose MRSA infection nor to guide or monitor treatment for MRSA infections. Test performance is not FDA approved in patients less than 36 years old. Performed at [facility], [address], [place], [place] [address]          Radiology Studies: DG CHEST PORT 1 VIEW  Result Date: 11/20/2023 CLINICAL DATA:  Altered level of consciousness, cough, decreased appetite EXAM: PORTABLE CHEST 1 VIEW COMPARISON:  11/18/2023 FINDINGS: Single frontal view of the chest demonstrates postsurgical changes from CABG and aortic valve replacement. Cardiac silhouette is stable, with continued atherosclerosis of the thoracic aorta noted. No acute airspace disease, effusion, or pneumothorax. No acute bony abnormality. IMPRESSION: 1. Stable chest, no acute process. Electronically Signed   By: Sharlet Salina M.D.   On: 11/20/2023 16:26     Scheduled Meds:  enoxaparin (LOVENOX) injection  30 mg Subcutaneous Q24H    HYDROmorphone (DILAUDID) injection  0.5 mg Intravenous Q4H   insulin aspart  0-6 Units Subcutaneous TID WC   Continuous Infusions:  ceFEPime (MAXIPIME) IV 2 g (11/22/23 1017)   dextrose 75 mL/hr at 11/22/23 0013     LOS: 4 days    Time spent: 39 min  Alwyn Ren, MD   11/22/2023, 11:22 AM

## 2023-11-22 NOTE — Plan of Care (Signed)
  Problem: Coping: Goal: Level of anxiety will decrease Outcome: Progressing   Problem: Elimination: Goal: Will not experience complications related to urinary retention Outcome: Progressing   Problem: Pain Management: Goal: General experience of comfort will improve Outcome: Progressing   Problem: Safety: Goal: Ability to remain free from injury will improve Outcome: Progressing   Problem: Skin Integrity: Goal: Risk for impaired skin integrity will decrease Outcome: Progressing

## 2023-11-22 NOTE — Plan of Care (Signed)
  Problem: Pain Management: Goal: General experience of comfort will improve Outcome: Progressing   Problem: Safety: Goal: Ability to remain free from injury will improve Outcome: Progressing

## 2023-11-23 DIAGNOSIS — Z66 Do not resuscitate: Secondary | ICD-10-CM

## 2023-11-23 DIAGNOSIS — A419 Sepsis, unspecified organism: Secondary | ICD-10-CM

## 2023-11-23 DIAGNOSIS — R6520 Severe sepsis without septic shock: Secondary | ICD-10-CM

## 2023-11-23 DIAGNOSIS — E860 Dehydration: Secondary | ICD-10-CM

## 2023-11-23 DIAGNOSIS — Z7189 Other specified counseling: Secondary | ICD-10-CM

## 2023-11-23 DIAGNOSIS — N179 Acute kidney failure, unspecified: Secondary | ICD-10-CM | POA: Diagnosis not present

## 2023-11-23 DIAGNOSIS — Z79899 Other long term (current) drug therapy: Secondary | ICD-10-CM

## 2023-11-23 DIAGNOSIS — R400 Somnolence: Secondary | ICD-10-CM

## 2023-11-23 DIAGNOSIS — Z515 Encounter for palliative care: Secondary | ICD-10-CM

## 2023-11-23 DIAGNOSIS — N390 Urinary tract infection, site not specified: Secondary | ICD-10-CM

## 2023-11-23 DIAGNOSIS — R319 Hematuria, unspecified: Secondary | ICD-10-CM

## 2023-11-23 DIAGNOSIS — E870 Hyperosmolality and hypernatremia: Secondary | ICD-10-CM

## 2023-11-23 LAB — COMPREHENSIVE METABOLIC PANEL
ALT: 13 U/L (ref 0–44)
AST: 23 U/L (ref 15–41)
Albumin: 1.9 g/dL — ABNORMAL LOW (ref 3.5–5.0)
Alkaline Phosphatase: 52 U/L (ref 38–126)
Anion gap: 6 (ref 5–15)
BUN: 29 mg/dL — ABNORMAL HIGH (ref 8–23)
CO2: 25 mmol/L (ref 22–32)
Calcium: 10.4 mg/dL — ABNORMAL HIGH (ref 8.9–10.3)
Chloride: 107 mmol/L (ref 98–111)
Creatinine, Ser: 1.17 mg/dL (ref 0.61–1.24)
GFR, Estimated: >60 mL/min (ref >60)
Glucose, Bld: 199 mg/dL — ABNORMAL HIGH (ref 70–99)
Potassium: 3.4 mmol/L — ABNORMAL LOW (ref 3.5–5.1)
Sodium: 138 mmol/L (ref 135–145)
Total Bilirubin: 0.6 mg/dL (ref <1.2)
Total Protein: 5.9 g/dL — ABNORMAL LOW (ref 6.5–8.1)

## 2023-11-23 LAB — CULTURE, BLOOD (ROUTINE X 2)
Culture: NO GROWTH
Culture: NO GROWTH

## 2023-11-23 LAB — GLUCOSE, CAPILLARY
Glucose-Capillary: 159 mg/dL — ABNORMAL HIGH (ref 70–99)
Glucose-Capillary: 171 mg/dL — ABNORMAL HIGH (ref 70–99)
Glucose-Capillary: 158 mg/dL — ABNORMAL HIGH (ref 70–99)

## 2023-11-23 MED ORDER — HYDROMORPHONE HCL 1 MG/ML IJ SOLN: 0.5000 mg | INTRAMUSCULAR | Status: DC | PRN

## 2023-11-23 MED ORDER — GLYCOPYRROLATE 0.2 MG/ML IJ SOLN: 0.2000 mg | Freq: Four times a day (QID) | INTRAMUSCULAR | Status: DC | PRN

## 2023-11-23 MED ORDER — BISACODYL 10 MG RE SUPP: 10.0000 mg | Freq: Every day | RECTAL | Status: DC | PRN

## 2023-11-23 MED ORDER — LORAZEPAM 2 MG/ML IJ SOLN: 0.5000 mg | INTRAMUSCULAR | Status: DC | PRN

## 2023-11-23 MED ORDER — HYDROMORPHONE HCL 1 MG/ML IJ SOLN
0.5000 mg | INTRAMUSCULAR | Status: DC
  Administered 2023-11-23: 0.5 mg via INTRAVENOUS
  Filled 2023-11-23: qty 0.5

## 2023-11-23 NOTE — Progress Notes (Signed)
Pharmacy Antibiotic Note  [name] is a 89 y.o. male admitted on 11/18/2023 with UTI.  Pharmacy has been consulted for cefepime dosing.  Pt remained febrile on ceftriaxone, urine cultures grew Pseudomonas. Antibiotics were broadened from ceftriaxone to cefepime on 11/30.  Today, 11/23/23 Day #4 of cefepime  CrCl = 38.8 mL/min, improved WBC down to 11.9, afebrile  Given the patient's age (89 YO) and poor renal function, will continue cefepime therapy for now and defer transition to an oral fluoroquinolone.   Plan: Continue cefepime 2 g IV q12h Monitor renal function F/u duration of therapy   Height: 5\' 6"  (167.6 cm) Weight: 61.6 kg (135 lb 12.9 oz) IBW/kg (Calculated) : 63.8  Temp (24hrs), Avg:97.9 F (36.6 C), Min:97.7 F (36.5 C), Max:98.5 F (36.9 C)  Recent Labs  Lab 11/18/23 1318 11/18/23 1334 11/19/23 0419 11/20/23 1103 11/21/23 0353 11/22/23 0352 11/23/23 0636  WBC 12.7*  --  12.1* 11.8* 11.9*  --   --   CREATININE 1.83*  --  1.58* 1.44* 1.53* 1.53* 1.17  LATICACIDVEN  --  1.3  --   --   --   --   --     Estimated Creatinine Clearance: 38.8 mL/min (by C-G formula based on SCr of 1.17 mg/dL).    Allergies  Allergen Reactions   Fish Allergy Swelling    Dark fish- salmon, tuna    Lipitor [Atorvastatin] Other (See Comments)    Muscle pain   Penicillins Swelling    SWELLING OF THE FEET  Has patient had a PCN reaction causing immediate rash, facial/tongue/throat swelling, SOB or lightheadedness with hypotension: no Has patient had a PCN reaction causing severe rash involving mucus membranes or skin necrosis: no Has patient had a PCN reaction that required hospitalization no Has patient had a PCN reaction occurring within the last 10 years: no If all of the above answers are "NO", then may proceed with Cephalosporin use.   Simvastatin Other (See Comments)    MUSCLE PAIN   Adacel [Tetanus-Diphth-Acell Pertussis]     UNSPECIFIED REACTION    Lasix  [Furosemide] Swelling    SWELLING REACTION UNSPECIFIED    Septra [Sulfamethoxazole-Trimethoprim]     UNSPECIFIED REACTION    Flexeril [Cyclobenzaprine] Other (See Comments)    Causes confusion   Pantoprazole Diarrhea    Antimicrobials this admission: ceftriaxone 11/28 >> 11/29 cefepime 11/30 >>   Dose adjustments this admission: N/A  Microbiology results: 11/28 BCx: NG x5 days 11/28 UCx: >100k Pseudomonas aeruginosa (pan-sensitive)   Cherylin Mylar, PharmD Clinical Pharmacist  12/3/20249:25 AM

## 2023-11-23 NOTE — TOC Progression Note (Signed)
Transition of Care [facility]) - Progression Note    Patient Details  Name: [name] MRN: [id number] Date of Birth: 1934-09-02  [facility]) CM/SW Contact  Larrie Kass, LCSW Phone Number: 11/23/2023, 10:01 AM  Clinical Narrative:    Pt is being evaluated for placement at [facility], per [facility] liaison no beds available yesterday will follow up when bed is available. TOC to follow.    Expected Discharge Plan: Hospice Medical Facility Barriers to Discharge: Continued Medical Work up  Expected Discharge Plan and Services In-house Referral: Clinical Social Work Discharge Planning Services: NA Post Acute Care Choice: Resumption of Svcs/PTA Provider, Nursing Home Living arrangements for the past 2 months: Skilled Nursing Facility                 DME Arranged: N/A DME Agency: NA                   Social Determinants of Health (SDOH) Interventions SDOH Screenings   Food Insecurity: Patient Unable To Answer (11/19/2023)  Housing: Patient Unable To Answer (10/28/2023)  Recent Concern: Housing - High Risk (09/24/2023)  Transportation Needs: Patient Unable To Answer (11/19/2023)  Utilities: Patient Unable To Answer (11/19/2023)  Social Connections: Unknown (05/05/2022)   Received from [facility]  Stress: No Stress Concern Present (09/06/2023)   Received from [facility]  Tobacco Use: Medium Risk (11/21/2023)    Readmission Risk Interventions    11/19/2023    1:45 PM 11/03/2023    1:08 PM  Readmission Risk Prevention Plan  Transportation Screening Complete Complete  PCP or Specialist Appt within 5-7 Days  Complete  Home Care Screening  Complete  Medication Review (RN CM)  Complete  Medication Review (RN Care Manager) Complete   PCP or Specialist appointment within 3-5 days of discharge Complete   HRI or Home Care Consult Complete   SW Recovery Care/Counseling Consult Complete   Palliative Care Screening Complete   Skilled Nursing  Facility Complete

## 2023-11-23 NOTE — TOC Transition Note (Signed)
Transition of Care [facility]) - CM/SW Discharge Note   Patient Details  Name: [name] MRN: [id number] Date of Birth: [birth year]  Transition of [facility]) CM/SW Contact:  Larrie Kass, LCSW Phone Number: 11/23/2023, 2:43 PM   Clinical Narrative:     Pt to d/c to [facility], pt's family is aware of transfer. PTAR called no further TOC needs, TOC sign off.   Final next level of care: Hospice Medical Facility Barriers to Discharge: No Barriers Identified   Patient Goals and CMS Choice CMS Medicare.gov Compare Post Acute Care list provided to:: Patient Represenative (must comment) Choice offered to / list presented to : [facility] POA / Guardian  Discharge Placement                      Patient and family notified of of transfer: 11/23/23  Discharge Plan and Services Additional resources added to the After Visit Summary for   In-house Referral: Clinical Social Work Discharge Planning Services: NA Post Acute Care Choice: Resumption of Svcs/PTA Provider, Nursing Home          DME Arranged: N/A DME Agency: NA                  Social Determinants of Health (SDOH) Interventions SDOH Screenings   Food Insecurity: Patient Unable To Answer (11/19/2023)  Housing: Patient Unable To Answer (10/28/2023)  Recent Concern: Housing - High Risk (09/24/2023)  Transportation Needs: Patient Unable To Answer (11/19/2023)  Utilities: Patient Unable To Answer (11/19/2023)  Social Connections: Unknown (05/05/2022)   Received from [facility], [facility]  Stress: No Stress Concern Present (09/06/2023)   Received from [facility]  Tobacco Use: Medium Risk (11/21/2023)     Readmission Risk Interventions    11/19/2023    1:45 PM 11/03/2023    1:08 PM  Readmission Risk Prevention Plan  Transportation Screening Complete Complete  PCP or Specialist Appt within 5-7 Days  Complete  Home Care Screening  Complete  Medication Review (RN CM)  Complete  Medication Review (RN  Care Manager) Complete   PCP or Specialist appointment within 3-5 days of discharge Complete   HRI or Home Care Consult Complete   SW Recovery Care/Counseling Consult Complete   Palliative Care Screening Complete   Skilled Nursing Facility Complete

## 2023-11-23 NOTE — Progress Notes (Signed)
Daily Progress Note   Patient Name: [name]       Date: 11/23/2023 DOB: 24-Jul-1934  Age: 89 y.o. MRN#: [id number] Attending Physician: Alwyn Ren, MD Primary Care Physician: [name], MD Admit Date: 11/18/2023 Length of Stay: 5 days  Reason for Consultation/Follow-up: Establishing goals of care  Subjective:   CC: Patient not able to speak due to medical status at this time.  Discussed care with patient's nephew.  Following up regarding complex medical decision making.  Subjective:  Reviewed EMR prior to presenting to bedside.  At time of EMR review patient receiving IV Dilaudid 0.5 mg every 4 hours scheduled.  Patient has not had any oral intake per EMR review.    Able to discuss care with IDT including [facility] liaison for updates.  [facility] liaison had spoken to patient's nephew this morning about [facility] placement though there was confusion about continuing IV fluids and antibiotics.  Noted would reach out to patient's nephew discuss.  Presented to bedside to see patient.  No family present at bedside.  Patient laying in bed with eyes open.  Patient not tracking with his eyes.  Patient unable to speak.  Able to call patient's nephew, [name].  Introduced myself as a member of the palliative medicine team.  Able to discuss patient's care with [name].  [name] noted confusion as once he had spoken to [facility] liaison this morning he thought patient would not be able to keep his IV in for comfort focused medicines at [facility].  Explained that patient would be going to [facility] specifically for needing IV medications.  Would not continue antibiotics and IV fluids at [facility].  Nephew acknowledged this and agreed that with focusing on comfort, patient should no longer be receiving IV fluids, antibiotics, or Lovenox injection.  He acknowledges patient is at the end of life and dying. Noted would change all medications to focus on comfort only which nephew agreed with.  Nephew is  hopeful that patient can go to [facility] today now that he is further clarity that patient will be able to continue IV medications for comfort.  Did learn about patient's family as well.  [name] noted that patient's wife has dementia and is not able to engage at all in complex medical decision making.  He is hopeful to bring her to visit the patient at [facility] once he is there.  [name] noted that patient did not have any children.  [name] stated that he and his sister are patient's next of kin.  Both he and his sister agree that patient should go to [facility] for end-of-life care.  Noted that as patient's next of kin, [facility] liaison will reach out to them regarding paperwork for this.  Spent time answering questions as able and providing emotional support via active listening.  [name] for allow me to speak with him today.  Updated IDT including [facility] liaison about full comfort focused care measures in place and nephew wanting patient to transfer to [facility] when bed available.  Review of Systems  Objective:   Vital Signs:  BP 96/66   Pulse 100   Temp 97.7 F (36.5 C) (Oral)   Resp 14   Ht 5\' 6"  (1.676 m)   Wt 61.6 kg   SpO2 100%   BMI 21.92 kg/m   Physical Exam: General: NAD, eyes open, chronically ill-appearing, frail, not interacting verbally or tracking with eyes HENT: dry mucous membranes Cardiovascular: RRR Respiratory: no increased  work of breathing noted, not in respiratory distress  Imaging:  I personally reviewed recent imaging.   Assessment & Plan:   Assessment: Patient is an 89 y.o. male with medical history significant of stage III CKD stage IIIa, renal cell carcinoma status post nephrectomy, bladder cancer, prostate cancer status postradiation, hypertension hyperlipidemia recent TAVR, CABG, type 2 diabetes, systolic heart failure, chronic back pain recent back surgery, gout, GERD, he had back surgery in October 2024 complicated by postsurgical wound  infection cellulitis and UTI.  He was discharged to [facility] in October and has been living at [facility] now under long-term care.  He was sent to the hospital due to change in mental status decreased p.o. intake no appetite for the last 5 to 6 weeks.    Recommendations/Plan: # Complex medical decision making/goals of care:  - Patient unable to participate in complex medical decision making due to medical status.  -Discussed patient's care with next of kin, nephew [name].  Patient's wife has dementia and is unable to participate in complex medical decision making.  [name] and his sister have been visiting with patient and supporting patient's care.  [name] acknowledges that patient is at the end of his life and wants to be able to focus on comfort.  Planning for patient to transfer to [facility] for end-of-life care and symptom management.  -  Code Status: Do not attempt resuscitation (DNR) - Comfort care  # Symptom management    -Pain/Dyspnea, acute in the setting of end-of-life care                 -Continue IV Dilaudid 0.5 mg every 4 hours scheduled   -Increase IV Dilaudid to 0.5-1 mg every hour as needed for breakthrough pain.                  -Anxiety/agitation, in the setting of end-of-life care                               -Start IV Ativan 0.5 mg every 4 hours as needed. Continue to adjust based on patient's symptom burden.                    -Secretions, in the setting of end-of-life care                               -Start IV glycopyrrolate 0.2 mg every 4 hours as needed.  # Psychosocial Support:  -[name], niece, wife (has dementia)  # Discharge Planning: Hospice facility  Discussed with: Patient's nephew, hospitalist, [facility] liaison, RN  Thank you for allowing the palliative care team to participate in the care [name].  Steven Morin, DO Palliative Care Provider PMT # [phone]  If patient remains symptomatic despite maximum doses, please call PMT at [phone]  between 0700 and 1900. Outside of these hours, please call attending, as PMT does not have night coverage.  *Please note that this is a verbal dictation therefore any spelling or grammatical errors are due to the "[employer]" system interpretation.

## 2023-11-23 NOTE — Discharge Summary (Signed)
Physician Discharge Summary  [name] KGM:[id number] DOB: 08-15-1934 DOA: 11/18/2023  PCP: [name], MD  Admit date: 11/18/2023 Discharge date: 11/23/2023  Admitted From: Nursing home Disposition: [facility]  Recommendations for Outpatient Follow-up: None Home Health: None Equipment/Devices: None  Discharge Condition: Hospice CODE STATUS: Comfort care Diet recommendation: Comfort feeding  Brief/Interim Summary: 89 y.o. male with medical history significant of stage III CKD stage IIIa, renal cell carcinoma status post nephrectomy, bladder cancer, prostate cancer status postradiation, hypertension hyperlipidemia recent TAVR, CABG, type 2 diabetes, systolic heart failure, chronic back pain recent back surgery, gout, GERD, he had back surgery in October 2024 complicated by postsurgical wound infection cellulitis and UTI.  He was discharged to [facility] in October and has been living at [facility] now under long-term care.  He was sent to the hospital due to change in mental status decreased p.o. intake no appetite for the last 5 to 6 weeks.  Family denies nausea vomiting diarrhea.  No fever chills or cough reported.  He does not have a Foley catheter.  He does wear diaper and is dry when I saw him in the ER.  In the ER he was found to have a very elevated calcium of 13, hypernatremia sodium 147, UA consistent with dehydration and UTI with elevated white count of 12 tachypnea tachycardia and cytosis.  He is being admitted with severe dehydration hypercalcemia, hyponatremia and sepsis likely from urinary tract infection.  History was obtained from the patient's records and patient's wife who has memory issues lives in memory care and his niece was in the ER when I saw the patient.  CODE STATUS was discussed with patient with niece and agreed that DNR would be appropriate for him in this situation.  Patient appears very masticated with scaphoid belly and unable to follow commands appears  extremely dry. He received Ringer lactate bolus and Rocephin in the ER   Discharge Diagnoses:  Principal Problem:   AKI (acute kidney injury) (HCC) Active Problems:   Sepsis (HCC)   Hypercalcemia   Hypernatremia   Dehydration   Palliative care encounter   Urinary tract infection with hematuria   Goals of care, counseling/discussion   End of life care   DNR (do not resuscitate)   Counseling and coordination of care   High risk medication use   Medication management   Pressure Injury 11/18/23 Coccyx Medial Stage 2 -  Partial thickness loss of dermis presenting as a shallow open injury with a red, pink wound bed without slough. Pink wound with avulsion (Active)  11/18/23 1929  Location: Coccyx  Location Orientation: Medial  Staging: Stage 2 -  Partial thickness loss of dermis presenting as a shallow open injury with a red, pink wound bed without slough.  Wound Description (Comments): Pink wound with avulsion  Present on Admission: Yes  Dressing Type Foam - Lift dressing to assess site every shift 11/22/23 0830    #1 hypercalcemia of dehydration in the setting of multiple malignancies with poor p.o. intake for 5 to 6 weeks.   Ca 11 from 11.7 from 12.6 from 13.5  S/p Pamidronate 11/29   #2 hypernatremia - resolved    #3  Sepsis secondary to Pseudomonas UTI.  Patient met criteria for sepsis with leukocytosis tachypnea and tachycardia and fever.  He was started on cefepime 11/20/2023 and DC Rocephin.   #4 CODE STATUS DNR/comfort care patient was seen by palliative care during the hospital stay.  Family agreed for comfort care.    #  5 AKI-single kidney status post nephrectomy for renal cell carcinoma CKD stage III AA continue fluid hydration avoid nephrotoxins treated with IV fluids   #6 history of hypertension with hypotension on midodrine prior to admission   #7 history of systolic heart failure with TAVR   #8 dehisced postop lumbar wound/surgical wound he had surgery on  September 06, 2023 with purulent drainage patient was seen by wound care.   Clean back wound with Vashe [place] # [phone]), pat dry. apply Silver Hydrofiber (Aquacel [employer] [place] [phone]) cut to fit wound bed daily,  use a Q tip applicator to insert into wound bed leaving a tail of silver hanging out of wound for easy removal.   Cover with dry gauze and silicone foam. May lift foam daily to replace silver.  Change foam q3 days and prn soiling.  Moisten silver with NS if stuck to wound bed for atraumatic removal.   #9 stage II coccyx pressure injury present on admission-continue care with foam dressing   Estimated body mass index is 21.92 kg/m as calculated from the following:   Height as of this encounter: 5\' 6"  (1.676 m).   Weight as of this encounter: 61.6 kg.  Discharge Instructions  Discharge Instructions     Diet - low sodium heart healthy   Complete by: As directed    Discharge wound care:   Complete by: As directed    See orders   Increase activity slowly   Complete by: As directed       Allergies as of 11/23/2023       Reactions   Fish Allergy Swelling   Dark fish- salmon, tuna    Lipitor [atorvastatin] Other (See Comments)   Muscle pain   Penicillins Swelling   SWELLING OF THE FEET Has patient had a PCN reaction causing immediate rash, facial/tongue/throat swelling, SOB or lightheadedness with hypotension: no Has patient had a PCN reaction causing severe rash involving mucus membranes or skin necrosis: no Has patient had a PCN reaction that required hospitalization no Has patient had a PCN reaction occurring within the last 10 years: no If all of the above answers are "NO", then may proceed with Cephalosporin use.   Simvastatin Other (See Comments)   MUSCLE PAIN   Adacel [tetanus-diphth-acell Pertussis]    UNSPECIFIED REACTION    Lasix [furosemide] Swelling   SWELLING REACTION UNSPECIFIED    Septra [sulfamethoxazole-trimethoprim]    UNSPECIFIED REACTION     Flexeril [cyclobenzaprine] Other (See Comments)   Causes confusion   Pantoprazole Diarrhea        Medication List     STOP taking these medications    aspirin 81 MG chewable tablet   doxycycline 100 MG tablet Commonly known as: VIBRA-TABS   DULoxetine 20 MG capsule Commonly known as: CYMBALTA   famotidine 20 MG tablet Commonly known as: PEPCID   gabapentin 100 MG capsule Commonly known as: NEURONTIN   lidocaine 5 % Commonly known as: LIDODERM   midodrine 10 MG tablet Commonly known as: PROAMATINE   multivitamin tablet   nitroGLYCERIN 0.4 MG SL tablet Commonly known as: NITROSTAT   nystatin cream Commonly known as: MYCOSTATIN   ondansetron 4 MG tablet Commonly known as: ZOFRAN   polyethylene glycol 17 g packet Commonly known as: MIRALAX / GLYCOLAX   psyllium 95 % Pack Commonly known as: HYDROCIL/METAMUCIL   ROLAIDS PO   Systane Complete 0.6 % Soln Generic drug: Propylene Glycol   tamsulosin 0.4 MG Caps capsule Commonly known as: FLOMAX  Vitamin D-3 125 MCG (5000 UT) Tabs       TAKE these medications    acetaminophen 500 MG tablet Commonly known as: TYLENOL Take 1 tablet (500 mg total) by mouth 3 (three) times daily.   nutrition supplement (JUVEN) Pack Take 1 packet by mouth 2 (two) times daily between meals.   Ensure Max Protein Liqd Take 330 mLs (11 oz total) by mouth daily.   LORazepam 0.5 MG tablet Commonly known as: ATIVAN Take 0.5 mg by mouth every 8 (eight) hours as needed.               Discharge Care Instructions  (From admission, onward)           Start     Ordered   11/23/23 0000  Discharge wound care:       Comments: See orders   11/23/23 1416            Allergies  Allergen Reactions   Fish Allergy Swelling    Dark fish- salmon, tuna    Lipitor [Atorvastatin] Other (See Comments)    Muscle pain   Penicillins Swelling    SWELLING OF THE FEET  Has patient had a PCN reaction causing immediate  rash, facial/tongue/throat swelling, SOB or lightheadedness with hypotension: no Has patient had a PCN reaction causing severe rash involving mucus membranes or skin necrosis: no Has patient had a PCN reaction that required hospitalization no Has patient had a PCN reaction occurring within the last 10 years: no If all of the above answers are "NO", then may proceed with Cephalosporin use.   Simvastatin Other (See Comments)    MUSCLE PAIN   Adacel [Tetanus-Diphth-Acell Pertussis]     UNSPECIFIED REACTION    Lasix [Furosemide] Swelling    SWELLING REACTION UNSPECIFIED    Septra [Sulfamethoxazole-Trimethoprim]     UNSPECIFIED REACTION    Flexeril [Cyclobenzaprine] Other (See Comments)    Causes confusion   Pantoprazole Diarrhea    Consultations: Palliative care   Procedures/Studies: DG CHEST PORT 1 VIEW  Result Date: 11/20/2023 CLINICAL DATA:  Altered level of consciousness, cough, decreased appetite EXAM: PORTABLE CHEST 1 VIEW COMPARISON:  11/18/2023 FINDINGS: Single frontal view of the chest demonstrates postsurgical changes from CABG and aortic valve replacement. Cardiac silhouette is stable, with continued atherosclerosis of the thoracic aorta noted. No acute airspace disease, effusion, or pneumothorax. No acute bony abnormality. IMPRESSION: 1. Stable chest, no acute process. Electronically Signed   By: Sharlet Salina M.D.   On: 11/20/2023 16:26   CT Head Wo Contrast  Result Date: 11/18/2023 CLINICAL DATA:  Mental status change, unknown cause EXAM: CT HEAD WITHOUT CONTRAST TECHNIQUE: Contiguous axial images were obtained from the base of the skull through the vertex without intravenous contrast. RADIATION DOSE REDUCTION: This exam was performed according to the departmental dose-optimization program which includes automated exposure control, adjustment of the mA and/or kV according to patient size and/or use of iterative reconstruction technique. COMPARISON:  11/10/2023 FINDINGS:  Brain: No evidence of acute infarction, hemorrhage, hydrocephalus, extra-axial collection or mass lesion/mass effect. Patchy low-density changes within the periventricular and subcortical white matter most compatible with chronic microvascular ischemic change. Moderate diffuse cerebral volume loss. Vascular: Atherosclerotic calcifications involving the large vessels of the skull base. No unexpected hyperdense vessel. Skull: Negative for calvarial fracture. Similar thinning of the parietal calvarium bilaterally. Sinuses/Orbits: No acute finding. Other: None. IMPRESSION: 1. No acute intracranial findings. 2. Chronic microvascular ischemic change and cerebral volume loss. Electronically Signed   By:  Nicholas  Plundo D.O.   On: 11/18/2023 14:21   DG Chest Port 1 View  Result Date: 11/18/2023 CLINICAL DATA:  Sepsis EXAM: PORTABLE CHEST 1 VIEW COMPARISON:  X-ray 11/10/2023. older exams FINDINGS: Sternal wires. Normal cardiopericardial silhouette. Calcified aorta. No consolidation, pneumothorax or effusion. No edema. Status post TAVR. Overlapping cardiac leads. Degenerative changes of the spine and shoulders. IMPRESSION: Postop chest. Status post TAVR. Underinflation. No acute cardiopulmonary disease Electronically Signed   By: Karen Kays M.D.   On: 11/18/2023 13:31   CT Head Wo Contrast  Result Date: 11/10/2023 CLINICAL DATA:  Mental status change, unknown cause Two falls today. EXAM: CT HEAD WITHOUT CONTRAST TECHNIQUE: Contiguous axial images were obtained from the base of the skull through the vertex without intravenous contrast. RADIATION DOSE REDUCTION: This exam was performed according to the departmental dose-optimization program which includes automated exposure control, adjustment of the mA and/or kV according to patient size and/or use of iterative reconstruction technique. COMPARISON:  Most recent head CT 10/28/2023 FINDINGS: Brain: No intracranial hemorrhage, mass effect, or midline shift. Stable  atrophy and chronic small vessel ischemia. No hydrocephalus. The basilar cisterns are patent. No evidence of territorial infarct or acute ischemia. No extra-axial or intracranial fluid collection. Vascular: Atherosclerosis of skullbase vasculature without hyperdense vessel or abnormal calcification. Skull: No fracture or focal lesion. Sinuses/Orbits: No acute finding.  Bilateral cataract resection Other: No confluent scalp hematoma. IMPRESSION: 1. No acute intracranial abnormality. No skull fracture. 2. Stable atrophy and chronic small vessel ischemia. Electronically Signed   By: Narda Rutherford M.D.   On: 11/10/2023 20:37   DG Chest Portable 1 View  Result Date: 11/10/2023 CLINICAL DATA:  Hypotension, multiple falls, altered level of consciousness EXAM: PORTABLE CHEST 1 VIEW COMPARISON:  11/05/2023 FINDINGS: Single frontal view of the chest demonstrate postsurgical changes from CABG and aortic valve replacement. Cardiac silhouette is stable. No acute airspace disease, effusion, or pneumothorax. No acute bony abnormalities. IMPRESSION: 1. No acute intrathoracic process. Electronically Signed   By: Sharlet Salina M.D.   On: 11/10/2023 20:16   DG CHEST PORT 1 VIEW  Result Date: 11/05/2023 CLINICAL DATA:  [id number] with shortness of breath. EXAM: PORTABLE CHEST 1 VIEW COMPARISON:  Portable chest 10/30/2023 FINDINGS: 5:02 a.m. TAVR and CABG changes are again shown and mild cardiomegaly. There is increased central vascular prominence, development of mild interstitial edema in the lung bases and small pleural effusions. Inspiration is lower than previously. There is increased opacity in the lung bases which could be due to atelectasis or pneumonic infiltrate. The upper lung fields are clear. Stable mediastinum. The aorta is tortuous and calcified. Osteopenia. IMPRESSION: 1. Increased central vascular prominence, development of mild interstitial edema in the lung bases and small pleural effusions. 2. Increased  opacity in the lung bases which could be due to atelectasis or pneumonic infiltrate. Low inspiration. 3. Aortic atherosclerosis. Electronically Signed   By: Almira Bar M.D.   On: 11/05/2023 07:12   CT RENAL STONE STUDY  Result Date: 10/30/2023 CLINICAL DATA:  Abdominal and flank pain.  Hematuria. EXAM: CT ABDOMEN AND PELVIS WITHOUT CONTRAST TECHNIQUE: Multidetector CT imaging of the abdomen and pelvis was performed following the standard protocol without IV contrast. RADIATION DOSE REDUCTION: This exam was performed according to the departmental dose-optimization program which includes automated exposure control, adjustment of the mA and/or kV according to patient size and/or use of iterative reconstruction technique. COMPARISON:  None Available. FINDINGS: Lower chest: There are trace bilateral pleural effusions. There is a  stable 5 mm nodular density in the right lower lobe image 7/26. Hepatobiliary: No focal liver abnormality is seen. No gallstones, gallbladder wall thickening, or biliary dilatation. Pancreas: Unremarkable. No pancreatic ductal dilatation or surrounding inflammatory changes. Spleen: Normal in size without focal abnormality. Adrenals/Urinary Tract: There is a small amount of layering hyperdensity in the bladder. There is asymmetric wall thickening of the inferior right bladder measuring up to 15 mm. There is left renal atrophy with some adjacent presumed scarring laterally. Left renal cyst measuring 10 mm is unchanged. There is no hydronephrosis. There are punctate calculi in the right kidney measuring up to 5 mm. There is an exophytic cyst in the inferior pole measuring 4.6 cm. There is no hydronephrosis. The adrenal glands are within normal limits. Stomach/Bowel: Stomach is within normal limits. Appendix a is not seen ppears normal. No evidence of bowel wall thickening, distention, or inflammatory changes. There is sigmoid and descending colon diverticulosis. Vascular/Lymphatic: Aortic  atherosclerosis. No enlarged abdominal or pelvic lymph nodes. Reproductive: Prostate radiotherapy seeds are present. Other: There is mild stranding and minimal fluid in the pelvis, nonspecific. There is a small fat containing umbilical hernia. Musculoskeletal: The bones are osteopenic. L1-L4 posterior fusion hardware is present. IMPRESSION: 1. Asymmetric wall thickening of the inferior right bladder worrisome for neoplasm. Recommend cystoscopy. 2. Small amount of layering hyperdensity in the bladder worrisome for blood products. 3. Nonobstructing right renal calculi. 4. Left renal atrophy with adjacent presumed scarring. 5. Bilateral renal cysts. No follow-up imaging recommended. 6. Colonic diverticulosis. 7. Trace bilateral pleural effusions. 8. Stable 5 mm nodular density in the right lower lobe. 9. Mild stranding and minimal fluid in the pelvis, nonspecific. Aortic Atherosclerosis (ICD10-I70.0). Electronically Signed   By: Darliss Cheney M.D.   On: 10/30/2023 20:28   DG CHEST PORT 1 VIEW  Result Date: 10/30/2023 CLINICAL DATA:  Cough EXAM: PORTABLE CHEST 1 VIEW COMPARISON:  10/28/2023 FINDINGS: Cardiomegaly status post median sternotomy and CABG. Mild diffuse interstitial opacity. Focal airspace opacity. No acute osseous findings. IMPRESSION: Cardiomegaly with mild diffuse interstitial opacity, likely edema. No focal airspace opacity. Electronically Signed   By: Jearld Lesch M.D.   On: 10/30/2023 17:45   CT Head Wo Contrast  Result Date: 10/28/2023 CLINICAL DATA:  Hypotension, weakness EXAM: CT HEAD WITHOUT CONTRAST TECHNIQUE: Contiguous axial images were obtained from the base of the skull through the vertex without intravenous contrast. RADIATION DOSE REDUCTION: This exam was performed according to the departmental dose-optimization program which includes automated exposure control, adjustment of the mA and/or kV according to patient size and/or use of iterative reconstruction technique. COMPARISON:   09/28/2023 FINDINGS: Brain: No evidence of acute infarction, hemorrhage, mass, mass effect, or midline shift. No hydrocephalus or extra-axial fluid collection. Age related cerebral atrophy Vascular: No hyperdense vessel. Atherosclerotic calcifications in the intracranial carotid and vertebral arteries. Skull: Negative for fracture or focal lesion. Sinuses/Orbits: No acute finding. Other: The mastoid air cells are well aerated. IMPRESSION: No acute intracranial process. Electronically Signed   By: Wiliam Ke M.D.   On: 10/28/2023 19:36   DG Chest Port 1 View  Result Date: 10/28/2023 CLINICAL DATA:  Possible sepsis.  Hypotension. EXAM: PORTABLE CHEST 1 VIEW COMPARISON:  September 23, 2023. FINDINGS: Stable cardiomediastinal silhouette. 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 Hypoinflation of the lungs is noted. Lungs are clear. Bony thorax is unremarkable. IMPRESSION: Hypoinflation of the lungs.  No acute abnormality seen. Electronically Signed   By: Zenda Alpers.D.  On: 10/28/2023 14:46   (Echo, Carotid, EGD, Colonoscopy, ERCP)    Subjective: Patient is resting in bed when I called his name loud he opens his eyes looks at me and then closes his eyes again appears weak chronically ill-appearing  Discharge Exam: Vitals:   11/23/23 0903 11/23/23 1216  BP: 96/66 126/68  Pulse: 100 100  Resp: 14 19  Temp: 97.7 F (36.5 C) 97.6 F (36.4 C)  SpO2: 100% 100%   Vitals:   11/22/23 2333 11/23/23 0537 11/23/23 0903 11/23/23 1216  BP: (!) 125/51 (!) 114/48 96/66 126/68  Pulse: (!) 51 88 100 100  Resp: 16 16 14 19   Temp: 97.9 F (36.6 C) 97.7 F (36.5 C) 97.7 F (36.5 C) 97.6 F (36.4 C)  TempSrc: Oral Oral Oral Oral  SpO2: 100% 100% 100% 100%  Weight:      Height:        General: Pt is resting chronically ill-appearing oral mucosa dry Cardiovascular: RRR, S1/S2 +, no rubs, no gallops Respiratory: CTA bilaterally, no wheezing, no  rhonchi Abdominal: Soft, NT, ND, bowel sounds + Extremities: no edema, no cyanosis    The results of significant diagnostics from this hospitalization (including imaging, microbiology, ancillary and laboratory) are listed below for reference.     Microbiology: Recent Results (from the past 240 hour(s))  Urine Culture     Status: Abnormal   Collection Time: 11/18/23 12:56 PM   Specimen: Urine, Random  Result Value Ref Range Status   Specimen Description   Final    URINE, RANDOM Performed at [facility], [address] [place], [place] [address]    Special Requests   Final    NONE Reflexed from [id number] Performed at [facility], [address] [place], [place] [address]    Culture >=100,000 COLONIES/mL PSEUDOMONAS AERUGINOSA (A)  Final   Report Status 11/21/2023 FINAL  Final   Organism ID, Bacteria PSEUDOMONAS AERUGINOSA (A)  Final      Susceptibility   Pseudomonas aeruginosa - MIC*    CEFTAZIDIME 4 SENSITIVE Sensitive     CIPROFLOXACIN <=0.25 SENSITIVE Sensitive     GENTAMICIN <=1 SENSITIVE Sensitive     IMIPENEM 2 SENSITIVE Sensitive     PIP/TAZO 16 SENSITIVE Sensitive ug/mL    CEFEPIME 2 SENSITIVE Sensitive     * >=100,000 COLONIES/mL PSEUDOMONAS AERUGINOSA  Blood Culture (routine x 2)     Status: None   Collection Time: 11/18/23  1:16 PM   Specimen: BLOOD RIGHT WRIST  Result Value Ref Range Status   Specimen Description   Final    BLOOD RIGHT WRIST Performed at [facility], [address] [place], [place] [address]    Special Requests   Final    BOTTLES DRAWN AEROBIC AND ANAEROBIC Blood Culture results may not be optimal due to an excessive volume of blood received in culture bottles Performed at [facility], [address] [place], [place] [address]    Culture   Final    NO GROWTH 5 DAYS Performed at [facility], [address], [place], [place] [address]    Report Status 11/23/2023 FINAL   Final  Blood Culture (routine x 2)     Status: None   Collection Time: 11/18/23  1:18 PM   Specimen: BLOOD  Result Value Ref Range Status   Specimen Description   Final    BLOOD RIGHT ANTECUBITAL Performed at [facility], [address]  [place], [place] [address]    Special Requests   Final    BOTTLES DRAWN AEROBIC AND ANAEROBIC Blood Culture results may not be optimal due to an excessive volume of blood received in culture bottles Performed at [facility], [address], [place], [place] [address]    Culture   Final    NO GROWTH 5 DAYS Performed at [facility], [address], [place], [place] [address]    Report Status 11/23/2023 FINAL  Final  Resp panel by RT-PCR (RSV, Flu A&B, Covid) Anterior Nasal Swab     Status: None   Collection Time: 11/18/23  3:27 PM   Specimen: Anterior Nasal Swab  Result Value Ref Range Status   SARS Coronavirus 2 by RT PCR NEGATIVE NEGATIVE Final    Comment: (NOTE) SARS-CoV-2 target nucleic acids are NOT DETECTED.  The SARS-CoV-2 RNA is generally detectable in upper respiratory specimens during the acute phase of infection. The lowest concentration of SARS-CoV-2 viral copies this assay can detect is 138 copies/mL. A negative result does not preclude SARS-Cov-2 infection and should not be used as the sole basis for treatment or other patient management decisions. A negative result may occur with  improper specimen collection/handling, submission of specimen other than nasopharyngeal swab, presence of viral mutation(s) within the areas targeted by this assay, and inadequate number of viral copies(<138 copies/mL). A negative result must be combined with clinical observations, patient history, and epidemiological information. The expected result is Negative.  Fact Sheet for Patients:  BloggerCourse.com  Fact Sheet for Healthcare Providers:   SeriousBroker.it  This test is no t yet approved or cleared by the [place] FDA and  has been authorized for detection and/or diagnosis of SARS-CoV-2 by FDA under an Emergency Use Authorization (EUA). This EUA will remain  in effect (meaning this test can be used) for the duration of the COVID-19 declaration under Section 564(b)(1) of the Act, 21 U.S.C.section 360bbb-3(b)(1), unless the authorization is terminated  or revoked sooner.       Influenza A by PCR NEGATIVE NEGATIVE Final   Influenza B by PCR NEGATIVE NEGATIVE Final    Comment: (NOTE) The Xpert Xpress SARS-CoV-2/FLU/RSV plus assay is intended as an aid in the diagnosis of influenza from Nasopharyngeal swab specimens and should not be used as a sole basis for treatment. Nasal washings and aspirates are unacceptable for Xpert Xpress SARS-CoV-2/FLU/RSV testing.  Fact Sheet for Patients: BloggerCourse.com  Fact Sheet for Healthcare Providers: SeriousBroker.it  This test is not yet approved or cleared by the [place] FDA and has been authorized for detection and/or diagnosis of SARS-CoV-2 by FDA under an Emergency Use Authorization (EUA). This EUA will remain in effect (meaning this test can be used) for the duration of the COVID-19 declaration under Section 564(b)(1) of the Act, 21 U.S.C. section 360bbb-3(b)(1), unless the authorization is terminated or revoked.     Resp Syncytial Virus by PCR NEGATIVE NEGATIVE Final    Comment: (NOTE) Fact Sheet for Patients: BloggerCourse.com  Fact Sheet for Healthcare Providers: SeriousBroker.it  This test is not yet approved or cleared by the [place] FDA and has been authorized for detection and/or diagnosis of SARS-CoV-2 by FDA under an Emergency Use Authorization (EUA). This EUA will remain in effect (meaning this test can be used) for  the duration of the COVID-19 declaration under Section 564(b)(1) of the Act, 21 U.S.C. section 360bbb-3(b)(1), unless the authorization is terminated or revoked.  Performed at [facility], [address] [place], [place] [address]   MRSA Next Gen by PCR, Nasal     Status: None   Collection Time: 11/19/23 12:08 PM   Specimen: Nasal Mucosa; Nasal Swab  Result Value Ref Range Status   MRSA by PCR Next Gen NOT DETECTED NOT DETECTED Final    Comment: (NOTE) The GeneXpert MRSA Assay (FDA approved for NASAL specimens only), is one component of a comprehensive MRSA colonization surveillance program. It is not intended to diagnose MRSA infection nor to guide or monitor treatment for MRSA infections. Test performance is not FDA approved in patients less than 104 years old. Performed at [facility], [address] [place], [place] [address]      Labs: BNP (last 3 results) Recent Labs    10/29/23 0604  BNP 236.0*   Basic Metabolic Panel: Recent Labs  Lab 11/19/23 0419 11/20/23 1103 11/21/23 0353 11/22/23 0352 11/23/23 0636  NA 148* 143 145 141 138  K 3.8 3.5 3.9 3.7 3.4*  CL 116* 109 113* 111 107  CO2 24 26 24 23 25   GLUCOSE 158* 186* 142* 183* 199*  BUN 43* 31* 30* 32* 29*  CREATININE 1.58* 1.44* 1.53* 1.53* 1.17  CALCIUM 12.6* 11.7* 11.7* 11.0* 10.4*   Liver Function Tests: Recent Labs  Lab 11/19/23 0419 11/20/23 1103 11/21/23 0353 11/22/23 0352 11/23/23 0636  AST 20 29 27 24 23   ALT 13 13 14 13 13   ALKPHOS 54 53 58 53 52  BILITOT 0.7 0.4 0.4 0.3 0.6  PROT 6.4* 6.2* 6.6 6.0* 5.9*  ALBUMIN 2.5* 2.2* 2.3* 2.1* 1.9*   No results for input(s): "LIPASE", "AMYLASE" in the last 168 hours. No results for input(s): "AMMONIA" in the last 168 hours. CBC: Recent Labs  Lab 11/18/23 1318 11/19/23 0419 11/20/23 1103 11/21/23 0353  WBC 12.7* 12.1* 11.8* 11.9*  NEUTROABS 8.6*  --   --   --   HGB 11.0* 9.6* 9.4* 10.2*  HCT 34.7*  33.0* 31.9* 35.1*  MCV 95.6 100.6* 100.0 101.7*  PLT 446* 345 321 291   Cardiac Enzymes: Recent Labs  Lab 11/18/23 1318  CKTOTAL 157   BNP: Invalid input(s): "POCBNP" CBG: Recent Labs  Lab 11/22/23 1652 11/22/23 2102 11/23/23 0126 11/23/23 0713 11/23/23 1131  GLUCAP 162* 163* 159* 171* 158*   D-Dimer No results for input(s): "DDIMER" in the last 72 hours. Hgb A1c No results for input(s): "HGBA1C" in the last 72 hours. Lipid Profile No results for input(s): "CHOL", "HDL", "LDLCALC", "TRIG", "CHOLHDL", "LDLDIRECT" in the last 72 hours. Thyroid function studies No results for input(s): "TSH", "T4TOTAL", "T3FREE", "THYROIDAB" in the last 72 hours.  Invalid input(s): "FREET3" Anemia work up No results for input(s): "VITAMINB12", "FOLATE", "FERRITIN", "TIBC", "IRON", "RETICCTPCT" in the last 72 hours. Urinalysis    Component Value Date/Time   COLORURINE BROWN (A) 11/18/2023 1256   APPEARANCEUR TURBID (A) 11/18/2023 1256   LABSPEC 1.018 11/18/2023 1256   PHURINE 5.0 11/18/2023 1256   GLUCOSEU NEGATIVE 11/18/2023 1256   HGBUR MODERATE (A) 11/18/2023 1256   BILIRUBINUR NEGATIVE 11/18/2023 1256   KETONESUR NEGATIVE 11/18/2023 1256   PROTEINUR 100 (A) 11/18/2023 1256   UROBILINOGEN 1.0 10/26/2015 0115   NITRITE POSITIVE (A) 11/18/2023 1256   LEUKOCYTESUR SMALL (A) 11/18/2023 1256   Sepsis Labs Recent Labs  Lab 11/18/23 1318 11/19/23 0419 11/20/23 1103 11/21/23 0353  WBC 12.7* 12.1* 11.8* 11.9*   Microbiology Recent Results (from the past 240 hour(s))  Urine Culture  Status: Abnormal   Collection Time: 11/18/23 12:56 PM   Specimen: Urine, Random  Result Value Ref Range Status   Specimen Description   Final    URINE, RANDOM Performed at [facility], [address] [place], [place] [address]    Special Requests   Final    NONE Reflexed from [id number] Performed at [facility], [address], [place], [place] [address]     Culture >=100,000 COLONIES/mL PSEUDOMONAS AERUGINOSA (A)  Final   Report Status 11/21/2023 FINAL  Final   Organism ID, Bacteria PSEUDOMONAS AERUGINOSA (A)  Final      Susceptibility   Pseudomonas aeruginosa - MIC*    CEFTAZIDIME 4 SENSITIVE Sensitive     CIPROFLOXACIN <=0.25 SENSITIVE Sensitive     GENTAMICIN <=1 SENSITIVE Sensitive     IMIPENEM 2 SENSITIVE Sensitive     PIP/TAZO 16 SENSITIVE Sensitive ug/mL    CEFEPIME 2 SENSITIVE Sensitive     * >=100,000 COLONIES/mL PSEUDOMONAS AERUGINOSA  Blood Culture (routine x 2)     Status: None   Collection Time: 11/18/23  1:16 PM   Specimen: BLOOD RIGHT WRIST  Result Value Ref Range Status   Specimen Description   Final    BLOOD RIGHT WRIST Performed at [facility], [address], [place], [place] [address]    Special Requests   Final    BOTTLES DRAWN AEROBIC AND ANAEROBIC Blood Culture results may not be optimal due to an excessive volume of blood received in culture bottles Performed at [facility], [address] [place], [place] [address]    Culture   Final    NO GROWTH 5 DAYS Performed at [facility], [address] [place], [place] [address]    Report Status 11/23/2023 FINAL  Final  Blood Culture (routine x 2)     Status: None   Collection Time: 11/18/23  1:18 PM   Specimen: BLOOD  Result Value Ref Range Status   Specimen Description   Final    BLOOD RIGHT ANTECUBITAL Performed at [facility], [address] [place], [place] [address]    Special Requests   Final    BOTTLES DRAWN AEROBIC AND ANAEROBIC Blood Culture results may not be optimal due to an excessive volume of blood received in culture bottles Performed at [facility], [address], [place], [place] [address]    Culture   Final    NO GROWTH 5 DAYS Performed at [facility], [address], [place], [place] [address]    Report Status 11/23/2023 FINAL  Final  Resp panel by  RT-PCR (RSV, Flu A&B, Covid) Anterior Nasal Swab     Status: None   Collection Time: 11/18/23  3:27 PM   Specimen: Anterior Nasal Swab  Result Value Ref Range Status   SARS Coronavirus 2 by RT PCR NEGATIVE NEGATIVE Final    Comment: (NOTE) SARS-CoV-2 target nucleic acids are NOT DETECTED.  The SARS-CoV-2 RNA is generally detectable in upper respiratory specimens during the acute phase of infection. The lowest concentration of SARS-CoV-2 viral copies this assay can detect is 138 copies/mL. A negative result does not preclude SARS-Cov-2 infection and should not be used as the sole basis for treatment or other patient management decisions. A negative result may occur with  improper specimen collection/handling, submission of specimen other than nasopharyngeal swab, presence of viral mutation(s) within the areas targeted by this assay, and inadequate number of  viral copies(<138 copies/mL). A negative result must be combined with clinical observations, patient history, and epidemiological information. The expected result is Negative.  Fact Sheet for Patients:  BloggerCourse.com  Fact Sheet for Healthcare Providers:  SeriousBroker.it  This test is no t yet approved or cleared by the [place] FDA and  has been authorized for detection and/or diagnosis of SARS-CoV-2 by FDA under an Emergency Use Authorization (EUA). This EUA will remain  in effect (meaning this test can be used) for the duration of the COVID-19 declaration under Section 564(b)(1) of the Act, 21 U.S.C.section 360bbb-3(b)(1), unless the authorization is terminated  or revoked sooner.       Influenza A by PCR NEGATIVE NEGATIVE Final   Influenza B by PCR NEGATIVE NEGATIVE Final    Comment: (NOTE) The Xpert Xpress SARS-CoV-2/FLU/RSV plus assay is intended as an aid in the diagnosis of influenza from Nasopharyngeal swab specimens and should not be used as a sole basis  for treatment. Nasal washings and aspirates are unacceptable for Xpert Xpress SARS-CoV-2/FLU/RSV testing.  Fact Sheet for Patients: BloggerCourse.com  Fact Sheet for Healthcare Providers: SeriousBroker.it  This test is not yet approved or cleared by the [place] FDA and has been authorized for detection and/or diagnosis of SARS-CoV-2 by FDA under an Emergency Use Authorization (EUA). This EUA will remain in effect (meaning this test can be used) for the duration of the COVID-19 declaration under Section 564(b)(1) of the Act, 21 U.S.C. section 360bbb-3(b)(1), unless the authorization is terminated or revoked.     Resp Syncytial Virus by PCR NEGATIVE NEGATIVE Final    Comment: (NOTE) Fact Sheet for Patients: BloggerCourse.com  Fact Sheet for Healthcare Providers: SeriousBroker.it  This test is not yet approved or cleared by the [place] FDA and has been authorized for detection and/or diagnosis of SARS-CoV-2 by FDA under an Emergency Use Authorization (EUA). This EUA will remain in effect (meaning this test can be used) for the duration of the COVID-19 declaration under Section 564(b)(1) of the Act, 21 U.S.C. section 360bbb-3(b)(1), unless the authorization is terminated or revoked.  Performed at [facility], [address] [place], [place] [address]   MRSA Next Gen by PCR, Nasal     Status: None   Collection Time: 11/19/23 12:08 PM   Specimen: Nasal Mucosa; Nasal Swab  Result Value Ref Range Status   MRSA by PCR Next Gen NOT DETECTED NOT DETECTED Final    Comment: (NOTE) The GeneXpert MRSA Assay (FDA approved for NASAL specimens only), is one component of a comprehensive MRSA colonization surveillance program. It is not intended to diagnose MRSA infection nor to guide or monitor treatment for MRSA infections. Test performance is not FDA  approved in patients less than 52 years old. Performed at [facility], [address], [place], [place] [address]      Time coordinating discharge:  39 minutes  SIGNED  Alwyn Ren, MD  [facility] 11/23/2023, 2:51 PM

## 2023-11-23 NOTE — Progress Notes (Signed)
Patient received discharge orders to go to [facility]. RN called [employer] and gave report on patient. [facility] was called to pick up patient to take patient to [facility]. Discharge packet ready to go with PT[facility] upon arrival.  [facility] came to pick up patient to take patient to [facility]. Patient left the hospital stable with PIV still in place and with any personal belongings.

## 2023-11-23 NOTE — Plan of Care (Signed)
  Problem: Education: Goal: Knowledge of General Education information will improve Description: Including pain rating scale, medication(s)/side effects and non-pharmacologic comfort measures Outcome: Completed/Met   Problem: Health Behavior/Discharge Planning: Goal: Ability to manage health-related needs will improve Outcome: Completed/Met   Problem: Clinical Measurements: Goal: Ability to maintain clinical measurements within normal limits will improve Outcome: Completed/Met Goal: Will remain free from infection Outcome: Completed/Met Goal: Diagnostic test results will improve Outcome: Completed/Met Goal: Respiratory complications will improve Outcome: Completed/Met Goal: Cardiovascular complication will be avoided Outcome: Completed/Met   Problem: Activity: Goal: Risk for activity intolerance will decrease Outcome: Completed/Met   Problem: Nutrition: Goal: Adequate nutrition will be maintained Outcome: Completed/Met   Problem: Coping: Goal: Level of anxiety will decrease Outcome: Completed/Met   Problem: Elimination: Goal: Will not experience complications related to bowel motility Outcome: Completed/Met Goal: Will not experience complications related to urinary retention Outcome: Completed/Met   Problem: Pain Management: Goal: General experience of comfort will improve Outcome: Completed/Met   Problem: Safety: Goal: Ability to remain free from injury will improve Outcome: Completed/Met   Problem: Skin Integrity: Goal: Risk for impaired skin integrity will decrease Outcome: Completed/Met   Problem: Education: Goal: Ability to describe self-care measures that may prevent or decrease complications (Diabetes Survival Skills Education) will improve Outcome: Completed/Met Goal: Individualized Educational Video(s) Outcome: Completed/Met   Problem: Coping: Goal: Ability to adjust to condition or change in health will improve Outcome: Completed/Met   Problem:  Fluid Volume: Goal: Ability to maintain a balanced intake and output will improve Outcome: Completed/Met   Problem: Health Behavior/Discharge Planning: Goal: Ability to identify and utilize available resources and services will improve Outcome: Completed/Met Goal: Ability to manage health-related needs will improve Outcome: Completed/Met   Problem: Metabolic: Goal: Ability to maintain appropriate glucose levels will improve Outcome: Completed/Met   Problem: Nutritional: Goal: Maintenance of adequate nutrition will improve Outcome: Completed/Met Goal: Progress toward achieving an optimal weight will improve Outcome: Completed/Met   Problem: Skin Integrity: Goal: Risk for impaired skin integrity will decrease Outcome: Completed/Met   Problem: Tissue Perfusion: Goal: Adequacy of tissue perfusion will improve Outcome: Completed/Met

## 2023-11-23 NOTE — Discharge Instructions (Signed)
Comment: 1. Clean back wound with Vashe [place] # [phone]), pat dry. 2. Apply Silver Hydrofiber (Aquacel [employer] [place] [phone]) cut to fit wound bed daily,  use a Q tip applicator to insert into wound bed leaving a tail of silver hanging out of wound for easy removal.   3. Cover with dry gauze and silicone foam. May lift foam daily to replace silver.  Change foam q3 days and prn soiling. Moisten silver with NS if stuck to wound bed for atraumatic removal. CHANGE SILVER DAILY

## 2023-12-22 DEATH — deceased
